# Patient Record
Sex: Male | Born: 1980 | Race: Black or African American | Hispanic: No | Marital: Single | Smoking: Former smoker
Health system: Southern US, Community
[De-identification: ages and names within clinical notes are randomized; demographics above are authoritative.]

## PROBLEM LIST (undated history)

## (undated) DIAGNOSIS — F32A Depression, unspecified: Secondary | ICD-10-CM

## (undated) DIAGNOSIS — F419 Anxiety disorder, unspecified: Secondary | ICD-10-CM

## (undated) DIAGNOSIS — F329 Major depressive disorder, single episode, unspecified: Secondary | ICD-10-CM

## (undated) DIAGNOSIS — D649 Anemia, unspecified: Secondary | ICD-10-CM

## (undated) DIAGNOSIS — F1110 Opioid abuse, uncomplicated: Secondary | ICD-10-CM

## (undated) DIAGNOSIS — J3489 Other specified disorders of nose and nasal sinuses: Secondary | ICD-10-CM

## (undated) HISTORY — DX: Anemia, unspecified: D64.9

## (undated) HISTORY — DX: Other specified disorders of nose and nasal sinuses: J34.89

---

## 2002-06-16 ENCOUNTER — Emergency Department (HOSPITAL_COMMUNITY): Admission: EM | Admit: 2002-06-16 | Discharge: 2002-06-16 | Payer: Self-pay | Admitting: Emergency Medicine

## 2004-10-27 ENCOUNTER — Emergency Department: Payer: Self-pay | Admitting: Emergency Medicine

## 2005-04-07 ENCOUNTER — Emergency Department: Payer: Self-pay | Admitting: Emergency Medicine

## 2007-12-06 ENCOUNTER — Emergency Department (HOSPITAL_COMMUNITY): Admission: EM | Admit: 2007-12-06 | Discharge: 2007-12-06 | Payer: Self-pay | Admitting: Emergency Medicine

## 2007-12-21 ENCOUNTER — Emergency Department (HOSPITAL_COMMUNITY): Admission: EM | Admit: 2007-12-21 | Discharge: 2007-12-22 | Payer: Self-pay | Admitting: Emergency Medicine

## 2007-12-22 ENCOUNTER — Emergency Department (HOSPITAL_BASED_OUTPATIENT_CLINIC_OR_DEPARTMENT_OTHER): Admission: EM | Admit: 2007-12-22 | Discharge: 2007-12-22 | Payer: Self-pay | Admitting: Emergency Medicine

## 2010-05-06 ENCOUNTER — Emergency Department (HOSPITAL_BASED_OUTPATIENT_CLINIC_OR_DEPARTMENT_OTHER): Admission: EM | Admit: 2010-05-06 | Discharge: 2010-05-06 | Payer: Self-pay | Admitting: Emergency Medicine

## 2010-07-17 ENCOUNTER — Emergency Department (HOSPITAL_BASED_OUTPATIENT_CLINIC_OR_DEPARTMENT_OTHER)
Admission: EM | Admit: 2010-07-17 | Discharge: 2010-07-17 | Payer: Self-pay | Source: Home / Self Care | Admitting: Emergency Medicine

## 2010-10-06 ENCOUNTER — Emergency Department (HOSPITAL_BASED_OUTPATIENT_CLINIC_OR_DEPARTMENT_OTHER)
Admission: EM | Admit: 2010-10-06 | Discharge: 2010-10-06 | Disposition: A | Discharge status: Home / Self Care | Payer: Self-pay | Attending: Emergency Medicine | Admitting: Emergency Medicine

## 2010-10-06 DIAGNOSIS — J45909 Unspecified asthma, uncomplicated: Secondary | ICD-10-CM | POA: Insufficient documentation

## 2010-10-06 DIAGNOSIS — M25569 Pain in unspecified knee: Secondary | ICD-10-CM | POA: Insufficient documentation

## 2010-10-06 DIAGNOSIS — F17200 Nicotine dependence, unspecified, uncomplicated: Secondary | ICD-10-CM | POA: Insufficient documentation

## 2010-10-20 ENCOUNTER — Emergency Department (HOSPITAL_BASED_OUTPATIENT_CLINIC_OR_DEPARTMENT_OTHER): Payer: Self-pay

## 2010-10-20 ENCOUNTER — Emergency Department (HOSPITAL_BASED_OUTPATIENT_CLINIC_OR_DEPARTMENT_OTHER)
Admission: EM | Admit: 2010-10-20 | Discharge: 2010-10-20 | Disposition: A | Discharge status: Home / Self Care | Payer: Self-pay | Attending: Emergency Medicine | Admitting: Emergency Medicine

## 2010-10-20 ENCOUNTER — Emergency Department (INDEPENDENT_AMBULATORY_CARE_PROVIDER_SITE_OTHER): Payer: Self-pay

## 2010-10-20 DIAGNOSIS — M79609 Pain in unspecified limb: Secondary | ICD-10-CM

## 2010-10-20 DIAGNOSIS — W208XXA Other cause of strike by thrown, projected or falling object, initial encounter: Secondary | ICD-10-CM | POA: Insufficient documentation

## 2010-10-20 DIAGNOSIS — F17200 Nicotine dependence, unspecified, uncomplicated: Secondary | ICD-10-CM | POA: Insufficient documentation

## 2010-10-20 DIAGNOSIS — S9030XA Contusion of unspecified foot, initial encounter: Secondary | ICD-10-CM | POA: Insufficient documentation

## 2010-11-01 ENCOUNTER — Emergency Department (HOSPITAL_BASED_OUTPATIENT_CLINIC_OR_DEPARTMENT_OTHER)
Admission: EM | Admit: 2010-11-01 | Discharge: 2010-11-01 | Disposition: A | Discharge status: Home / Self Care | Payer: Self-pay | Attending: Emergency Medicine | Admitting: Emergency Medicine

## 2010-11-01 DIAGNOSIS — M545 Low back pain, unspecified: Secondary | ICD-10-CM | POA: Insufficient documentation

## 2010-11-01 DIAGNOSIS — M5380 Other specified dorsopathies, site unspecified: Secondary | ICD-10-CM | POA: Insufficient documentation

## 2011-03-20 LAB — CULTURE, ROUTINE-ABSCESS

## 2011-04-12 ENCOUNTER — Encounter: Payer: Self-pay | Admitting: *Deleted

## 2011-04-12 ENCOUNTER — Emergency Department (HOSPITAL_BASED_OUTPATIENT_CLINIC_OR_DEPARTMENT_OTHER)
Admission: EM | Admit: 2011-04-12 | Discharge: 2011-04-12 | Disposition: A | Discharge status: Other Acute Inpatient Hospital | Payer: Self-pay | Attending: Emergency Medicine | Admitting: Emergency Medicine

## 2011-04-12 DIAGNOSIS — K089 Disorder of teeth and supporting structures, unspecified: Secondary | ICD-10-CM | POA: Insufficient documentation

## 2011-04-12 DIAGNOSIS — J45909 Unspecified asthma, uncomplicated: Secondary | ICD-10-CM | POA: Insufficient documentation

## 2011-04-12 DIAGNOSIS — F17200 Nicotine dependence, unspecified, uncomplicated: Secondary | ICD-10-CM | POA: Insufficient documentation

## 2011-04-12 DIAGNOSIS — K0889 Other specified disorders of teeth and supporting structures: Secondary | ICD-10-CM

## 2011-04-12 MED ORDER — HYDROCODONE-ACETAMINOPHEN 5-500 MG PO TABS: 1.0000 | ORAL_TABLET | Freq: Four times a day (QID) | ORAL | Status: AC | PRN

## 2011-04-12 NOTE — ED Provider Notes (Signed)
History/physical exam/procedure(s) were performed by non-physician practitioner and as supervising physician I was immediately available for consultation/collaboration. I have reviewed all notes and am in agreement with care and plan.   Hilario Quarry, MD 04/12/11 [phone]

## 2011-04-12 NOTE — ED Provider Notes (Signed)
History     CSN: [id number] Arrival date & time: 04/12/2011 12:18 PM   First MD Initiated Contact with Patient 04/12/11 1223      Chief Complaint  Patient presents with  . Dental Pain    (Consider location/radiation/quality/duration/timing/severity/associated sxs/prior treatment) HPI Comments: Pt states that he was seen by the dentist 5 days ago and referred to the oral surgeon for impacted wisdom teeth:pt was given antibiotics, but nothing for pain and pt that the pain is not tolerable for the next 5 days until he can see the surgeon  Patient is a 30 y.o. male presenting with tooth pain. The history is provided by the patient. No language interpreter was used.  Dental PainThe primary symptoms include mouth pain and headaches. The symptoms began more than 1 week ago. The symptoms are unchanged. The symptoms occur constantly.    Past Medical History  Diagnosis Date  . Asthma     History reviewed. No pertinent past surgical history.  History reviewed. No pertinent family history.  History  Substance Use Topics  . Smoking status: Current Everyday Smoker  . Smokeless tobacco: Not on file  . Alcohol Use: No      Review of Systems  Constitutional: Negative.   Respiratory: Negative.   Cardiovascular: Negative.   Neurological: Positive for headaches.    Allergies  Review of patient's allergies indicates no known allergies.  Home Medications   Current Outpatient Rx  Name Route Sig Dispense Refill  . CLINDAMYCIN HCL 150 MG PO CAPS Oral Take 150 mg by mouth 4 (four) times daily.        BP 152/80  Pulse 64  Temp(Src) 98.1 F (36.7 C) (Oral)  Resp 20  Ht 5\' 10"  (1.778 m)  Wt 255 lb (115.667 kg)  BMI 36.59 kg/m2  SpO2 98%  Physical Exam  Nursing note and vitals reviewed. Constitutional: He appears well-developed and well-nourished.  HENT:  Head: Normocephalic and atraumatic.  Right Ear: External ear normal.  Left Ear: External ear normal.       No gross  deformity or swelling noted to the left lower teeth:pt gum is tender to palpation  Cardiovascular: Normal rate and regular rhythm.   Pulmonary/Chest: Effort normal and breath sounds normal.    ED Course  Procedures (including critical care time)  Labs Reviewed - No data to display No results found.   1. Toothache       MDM Will treat for something for pain:pt is already on clindamycin        Teressa Lower, NP 04/12/11 1255

## 2011-04-12 NOTE — ED Notes (Signed)
Pt states he was seen at the dentist last week. Told he had an impacted wisdom tooth. Referred to an oral surgeon who cannot see him until next week.

## 2011-05-25 ENCOUNTER — Emergency Department (HOSPITAL_BASED_OUTPATIENT_CLINIC_OR_DEPARTMENT_OTHER)
Admission: EM | Admit: 2011-05-25 | Discharge: 2011-05-25 | Disposition: A | Discharge status: Home / Self Care | Payer: Self-pay | Attending: Emergency Medicine | Admitting: Emergency Medicine

## 2011-05-25 ENCOUNTER — Encounter (HOSPITAL_BASED_OUTPATIENT_CLINIC_OR_DEPARTMENT_OTHER): Payer: Self-pay | Admitting: Emergency Medicine

## 2011-05-25 DIAGNOSIS — F17200 Nicotine dependence, unspecified, uncomplicated: Secondary | ICD-10-CM | POA: Insufficient documentation

## 2011-05-25 DIAGNOSIS — J45909 Unspecified asthma, uncomplicated: Secondary | ICD-10-CM | POA: Insufficient documentation

## 2011-05-25 DIAGNOSIS — J111 Influenza due to unidentified influenza virus with other respiratory manifestations: Secondary | ICD-10-CM | POA: Insufficient documentation

## 2011-05-25 MED ORDER — KETOROLAC TROMETHAMINE 30 MG/ML IJ SOLN
30.0000 mg | Freq: Once | INTRAMUSCULAR | Status: AC
  Administered 2011-05-25: 30 mg via INTRAVENOUS
  Filled 2011-05-25: qty 1

## 2011-05-25 MED ORDER — ONDANSETRON HCL 4 MG/2ML IJ SOLN
4.0000 mg | Freq: Once | INTRAMUSCULAR | Status: AC
  Administered 2011-05-25: 4 mg via INTRAVENOUS
  Filled 2011-05-25: qty 2

## 2011-05-25 MED ORDER — SODIUM CHLORIDE 0.9 % IV BOLUS (SEPSIS)
1000.0000 mL | Freq: Once | INTRAVENOUS | Status: AC
  Administered 2011-05-25: 1000 mL via INTRAVENOUS

## 2011-05-25 MED ORDER — ACETAMINOPHEN 500 MG PO TABS
1000.0000 mg | ORAL_TABLET | Freq: Once | ORAL | Status: AC
  Administered 2011-05-25: 1000 mg via ORAL

## 2011-05-25 MED ORDER — OSELTAMIVIR PHOSPHATE 75 MG PO CAPS: 75.0000 mg | ORAL_CAPSULE | Freq: Two times a day (BID) | ORAL | Status: AC

## 2011-05-25 MED ORDER — MORPHINE SULFATE 4 MG/ML IJ SOLN
4.0000 mg | Freq: Once | INTRAMUSCULAR | Status: AC
  Administered 2011-05-25: 4 mg via INTRAVENOUS
  Filled 2011-05-25: qty 1

## 2011-05-25 NOTE — ED Notes (Signed)
Vomited 100 cc gastric fluid

## 2011-05-25 NOTE — ED Provider Notes (Signed)
History     CSN: [id number] Arrival date & time: 05/25/2011  8:48 AM   First MD Initiated Contact with Patient 05/25/11 [phone]      Chief Complaint  Patient presents with  . Fever    cough fever and vomitng x 48 hrs    (Consider location/radiation/quality/duration/timing/severity/associated sxs/prior treatment) HPI Comments: Did not receive flu vaccine  Patient is a 30 y.o. male presenting with fever. The history is provided by the patient. No language interpreter was used.  Fever Primary symptoms of the febrile illness include fever, fatigue, cough, nausea, vomiting and myalgias. Primary symptoms do not include headaches, shortness of breath, abdominal pain, dysuria or arthralgias. The current episode started yesterday. This is a new problem. The problem has been gradually worsening.  The fever began yesterday. The fever has been gradually worsening since its onset. The maximum temperature recorded prior to his arrival was 102 to 102.9 F. The temperature was taken by an oral thermometer.  The fatigue began yesterday. The fatigue has been worsening since its onset.  The cough began yesterday. The cough is new. The cough is non-productive.  Nausea began yesterday.  The vomiting began yesterday. Vomiting occurred once. The emesis contains stomach contents.  Myalgias began yesterday. The myalgias have been gradually worsening since their onset. The myalgias are generalized. The myalgias are aching. The discomfort from the myalgias is mild. The myalgias are not associated with weakness.    Past Medical History  Diagnosis Date  . Asthma     History reviewed. No pertinent past surgical history.  History reviewed. No pertinent family history.  History  Substance Use Topics  . Smoking status: Current Everyday Smoker  . Smokeless tobacco: Not on file  . Alcohol Use: No      Review of Systems  Constitutional: Positive for fever, chills, activity change, appetite change and fatigue.   HENT: Positive for congestion, sore throat and rhinorrhea. Negative for neck pain and neck stiffness.   Respiratory: Positive for cough. Negative for chest tightness and shortness of breath.   Cardiovascular: Negative for chest pain and palpitations.  Gastrointestinal: Positive for nausea and vomiting. Negative for abdominal pain.  Genitourinary: Negative for dysuria, urgency, frequency and flank pain.  Musculoskeletal: Positive for myalgias. Negative for back pain and arthralgias.  Neurological: Negative for dizziness, weakness, light-headedness, numbness and headaches.  All other systems reviewed and are negative.    Allergies  Review of patient's allergies indicates no known allergies.  Home Medications   Current Outpatient Rx  Name Route Sig Dispense Refill  . CLINDAMYCIN HCL 150 MG PO CAPS Oral Take 150 mg by mouth 4 (four) times daily.      . OSELTAMIVIR PHOSPHATE 75 MG PO CAPS Oral Take 1 capsule (75 mg total) by mouth every 12 (twelve) hours. 10 capsule 0    BP 125/77  Pulse 111  Temp 103.2 F (39.6 C)  Resp 24  SpO2 98%  Physical Exam  Nursing note and vitals reviewed. Constitutional: He is oriented to person, place, and time. He appears well-developed and well-nourished. No distress.  HENT:  Head: Normocephalic and atraumatic.  Right Ear: External ear normal.  Left Ear: External ear normal.  Mouth/Throat: Oropharynx is clear and moist. No oropharyngeal exudate.  Eyes: Conjunctivae and EOM are normal. Pupils are equal, round, and reactive to light.  Neck: Normal range of motion. Neck supple.  Cardiovascular: Regular rhythm, normal heart sounds and intact distal pulses.  Exam reveals no gallop and no friction rub.  No murmur heard.      Tachycardic rate  Pulmonary/Chest: Effort normal and breath sounds normal. No respiratory distress.  Abdominal: Soft. Bowel sounds are normal. There is no tenderness. There is no rebound and no guarding.  Musculoskeletal: Normal  range of motion. He exhibits no tenderness.  Lymphadenopathy:    He has no cervical adenopathy.  Neurological: He is alert and oriented to person, place, and time.  Skin: Skin is warm and dry. No rash noted.    ED Course  Procedures (including critical care time)  Labs Reviewed - No data to display No results found.   1. Influenza       MDM  Patient with influenza type illness. Your afebrile, myalgias, cough, congestion. He is a the treatment window for Tamiflu. He is provided this. I encouraged aggressive symptom control. Aggressive oral hydration at home. He is instructed to followup with her primary care physician next week. She's provide clear signs and symptoms for which returned emergency department.        Dayton Bailiff, MD 05/25/11 1150

## 2011-05-25 NOTE — ED Notes (Signed)
Pt present with fever and body aches with fever

## 2011-05-25 NOTE — ED Notes (Signed)
Pt reports "feeling Much better now"

## 2011-06-03 DIAGNOSIS — J45909 Unspecified asthma, uncomplicated: Secondary | ICD-10-CM | POA: Insufficient documentation

## 2011-06-03 DIAGNOSIS — E860 Dehydration: Secondary | ICD-10-CM | POA: Insufficient documentation

## 2011-06-03 DIAGNOSIS — R112 Nausea with vomiting, unspecified: Secondary | ICD-10-CM | POA: Insufficient documentation

## 2011-06-04 ENCOUNTER — Encounter (HOSPITAL_BASED_OUTPATIENT_CLINIC_OR_DEPARTMENT_OTHER): Payer: Self-pay | Admitting: *Deleted

## 2011-06-04 ENCOUNTER — Emergency Department (HOSPITAL_BASED_OUTPATIENT_CLINIC_OR_DEPARTMENT_OTHER)
Admission: EM | Admit: 2011-06-04 | Discharge: 2011-06-04 | Disposition: A | Discharge status: Home / Self Care | Payer: Self-pay | Attending: Emergency Medicine | Admitting: Emergency Medicine

## 2011-06-04 DIAGNOSIS — E860 Dehydration: Secondary | ICD-10-CM

## 2011-06-04 LAB — COMPREHENSIVE METABOLIC PANEL
AST: 13 U/L (ref 0–37)
Albumin: 3.8 g/dL (ref 3.5–5.2)
BUN: 10 mg/dL (ref 6–23)
Calcium: 9.3 mg/dL (ref 8.4–10.5)
Chloride: 102 mEq/L (ref 96–112)
Creatinine, Ser: 0.9 mg/dL (ref 0.50–1.35)
Total Bilirubin: 0.3 mg/dL (ref 0.3–1.2)
Total Protein: 7.1 g/dL (ref 6.0–8.3)

## 2011-06-04 LAB — CBC
MCH: 29.9 pg (ref 26.0–34.0)
MCV: 87.7 fL (ref 78.0–100.0)
Platelets: 88 10*3/uL — ABNORMAL LOW (ref 150–400)
RBC: 4.38 MIL/uL (ref 4.22–5.81)
RDW: 11.6 % (ref 11.5–15.5)

## 2011-06-04 LAB — LIPASE, BLOOD: Lipase: 12 U/L (ref 11–59)

## 2011-06-04 MED ORDER — MORPHINE SULFATE 4 MG/ML IJ SOLN
6.0000 mg | Freq: Once | INTRAMUSCULAR | Status: AC
  Administered 2011-06-04: 6 mg via INTRAVENOUS
  Filled 2011-06-04: qty 2

## 2011-06-04 MED ORDER — SODIUM CHLORIDE 0.9 % IV BOLUS (SEPSIS)
1000.0000 mL | Freq: Once | INTRAVENOUS | Status: AC
  Administered 2011-06-04: 1000 mL via INTRAVENOUS

## 2011-06-04 MED ORDER — PROMETHAZINE HCL 25 MG PO TABS: 25.0000 mg | ORAL_TABLET | Freq: Four times a day (QID) | ORAL | Status: AC | PRN

## 2011-06-04 MED ORDER — ONDANSETRON HCL 4 MG/2ML IJ SOLN
4.0000 mg | Freq: Once | INTRAMUSCULAR | Status: AC
  Administered 2011-06-04: 4 mg via INTRAVENOUS
  Filled 2011-06-04: qty 2

## 2011-06-04 NOTE — ED Notes (Signed)
C/o nausea and vomiting since last week. Pt was seen here and dx'd with flu last week but doesn't feel any better.

## 2011-06-04 NOTE — ED Provider Notes (Signed)
History     CSN: [id number] Arrival date & time: 06/04/2011 12:36 AM   First MD Initiated Contact with Patient 06/04/11 0002      Chief Complaint  Patient presents with  . Emesis  . Nausea    (Consider location/radiation/quality/duration/timing/severity/associated sxs/prior treatment) Patient is a 30 y.o. male presenting with vomiting. The history is provided by the patient.  Emesis    the patient reports he had flulike symptoms last week and was started on Tamiflu.  His symptoms at that time were cough congestion myalgias sore throat fever and chills.  He reports for the last 2 days he's developed severe nausea with some vomiting.  He reports several loose stools.  He reports no fever or chills at this time.  He reports decreased by mouth intake over the past 24 hours.  He has no recent sick contacts.  He reports abdominal pain only when he coughs.  He has no shortness of breath at this time but still does have a mild cough.  He denies dysuria or urinary frequency.  Past Medical History  Diagnosis Date  . Asthma     History reviewed. No pertinent past surgical history.  History reviewed. No pertinent family history.  History  Substance Use Topics  . Smoking status: Current Everyday Smoker  . Smokeless tobacco: Not on file  . Alcohol Use: No      Review of Systems  Gastrointestinal: Positive for vomiting.  All other systems reviewed and are negative.    Allergies  Review of patient's allergies indicates no known allergies.  Home Medications   Current Outpatient Rx  Name Route Sig Dispense Refill  . CLINDAMYCIN HCL 150 MG PO CAPS Oral Take 150 mg by mouth 4 (four) times daily.      . OSELTAMIVIR PHOSPHATE 75 MG PO CAPS Oral Take 1 capsule (75 mg total) by mouth every 12 (twelve) hours. 10 capsule 0    BP 133/87  Pulse 81  Temp(Src) 99.1 F (37.3 C) (Oral)  Resp 17  SpO2 98%  Physical Exam  Nursing note and vitals reviewed. Constitutional: He is oriented  to person, place, and time. He appears well-developed and well-nourished.  HENT:  Head: Normocephalic and atraumatic.       Mucous membranes dry  Eyes: EOM are normal.  Neck: Normal range of motion.  Cardiovascular: Normal rate, regular rhythm, normal heart sounds and intact distal pulses.   Pulmonary/Chest: Effort normal and breath sounds normal. No respiratory distress.  Abdominal: Soft. He exhibits no distension. There is no tenderness.  Musculoskeletal: Normal range of motion.  Neurological: He is alert and oriented to person, place, and time.  Skin: Skin is warm and dry.  Psychiatric: He has a normal mood and affect. Judgment normal.    ED Course  Procedures (including critical care time)  Labs Reviewed  CBC - Abnormal; Notable for the following:    HCT 38.4 (*)    Platelets 88 (*) SPECIMEN CHECKED FOR CLOTS   All other components within normal limits  COMPREHENSIVE METABOLIC PANEL - Abnormal; Notable for the following:    Glucose, Bld 114 (*)    All other components within normal limits  LIPASE, BLOOD   No results found.   1. Nausea and vomiting   2. Dehydration       MDM  We'll hydrate and check basic labs.  3:10 AM The patient feels much better at this time after 2 L of normal saline.  Home with antiemetics and close followup  Lyanne Co, MD 06/04/11 [phone]

## 2011-06-04 NOTE — ED Notes (Signed)
Pt did not have existing IV upon this assessment.

## 2011-09-22 ENCOUNTER — Encounter (HOSPITAL_BASED_OUTPATIENT_CLINIC_OR_DEPARTMENT_OTHER): Payer: Self-pay | Admitting: Family Medicine

## 2011-09-22 ENCOUNTER — Emergency Department (HOSPITAL_BASED_OUTPATIENT_CLINIC_OR_DEPARTMENT_OTHER)
Admission: EM | Admit: 2011-09-22 | Discharge: 2011-09-22 | Disposition: A | Discharge status: Home / Self Care | Payer: Self-pay | Attending: Emergency Medicine | Admitting: Emergency Medicine

## 2011-09-22 DIAGNOSIS — J45909 Unspecified asthma, uncomplicated: Secondary | ICD-10-CM | POA: Insufficient documentation

## 2011-09-22 DIAGNOSIS — R1110 Vomiting, unspecified: Secondary | ICD-10-CM

## 2011-09-22 DIAGNOSIS — R197 Diarrhea, unspecified: Secondary | ICD-10-CM | POA: Insufficient documentation

## 2011-09-22 LAB — CBC
HCT: 42.4 % (ref 39.0–52.0)
Hemoglobin: 14.2 g/dL (ref 13.0–17.0)
MCH: 29.3 pg (ref 26.0–34.0)
MCV: 87.4 fL (ref 78.0–100.0)
Platelets: 104 10*3/uL — ABNORMAL LOW (ref 150–400)
RBC: 4.85 MIL/uL (ref 4.22–5.81)
WBC: 6.5 10*3/uL (ref 4.0–10.5)

## 2011-09-22 LAB — BASIC METABOLIC PANEL
CO2: 22 mEq/L (ref 19–32)
Calcium: 9.7 mg/dL (ref 8.4–10.5)
Chloride: 105 mEq/L (ref 96–112)
Creatinine, Ser: 1.4 mg/dL — ABNORMAL HIGH (ref 0.50–1.35)
Glucose, Bld: 98 mg/dL (ref 70–99)

## 2011-09-22 MED ORDER — ONDANSETRON HCL 4 MG PO TABS: 4.0000 mg | ORAL_TABLET | Freq: Four times a day (QID) | ORAL | Status: AC

## 2011-09-22 MED ORDER — ONDANSETRON 4 MG PO TBDP
4.0000 mg | ORAL_TABLET | Freq: Once | ORAL | Status: AC
  Administered 2011-09-22: 4 mg via ORAL
  Filled 2011-09-22: qty 1

## 2011-09-22 MED ORDER — DICYCLOMINE HCL 20 MG PO TABS: 20.0000 mg | ORAL_TABLET | Freq: Two times a day (BID) | ORAL | Status: DC

## 2011-09-22 MED ORDER — DICYCLOMINE HCL 10 MG PO CAPS
10.0000 mg | ORAL_CAPSULE | Freq: Once | ORAL | Status: AC
  Administered 2011-09-22: 10 mg via ORAL
  Filled 2011-09-22: qty 1

## 2011-09-22 NOTE — ED Provider Notes (Signed)
Medical screening examination/treatment/procedure(s) were performed by non-physician practitioner and as supervising physician I was immediately available for consultation/collaboration.    Aariel Ems L Mitsy Owen, MD 09/22/11 1835 

## 2011-09-22 NOTE — ED Notes (Signed)
Patient tolerates PO water well; Sprite given.

## 2011-09-22 NOTE — ED Provider Notes (Signed)
History     CSN: [id number]  Arrival date & time 09/22/11  1215   First MD Initiated Contact with Patient 09/22/11 1256      Chief Complaint  Patient presents with  . Emesis  . Diarrhea    (Consider location/radiation/quality/duration/timing/severity/associated sxs/prior treatment) HPI  Pt presents to the ED for vomiting and a couple episodes of diarrhea without abdominal pain. The symptoms started Friday. He believes that he got the illness from whose daughter who just recently got over the same symptoms. He denies vomiting after every time he eats or drinks and feels as though he is hydrated. He has had a headache since vomiting so much but he is not having those symptoms anymore. He denies abd pain, SOB, weakness, CP, headaches, focal weakness, blurry vision or any other symptoms at this time,.  Past Medical History  Diagnosis Date  . Asthma     History reviewed. No pertinent past surgical history.  No family history on file.  History  Substance Use Topics  . Smoking status: Current Everyday Smoker  . Smokeless tobacco: Not on file  . Alcohol Use: No      Review of Systems  All other systems reviewed and are negative.    Allergies  Review of patient's allergies indicates no known allergies.  Home Medications   Current Outpatient Rx  Name Route Sig Dispense Refill  . BISMUTH SUBSALICYLATE 262 MG/15ML PO SUSP Oral Take 15 mLs by mouth every 6 (six) hours as needed. Patient used this medication for upset stomach.    Marland Kitchen CLINDAMYCIN HCL 150 MG PO CAPS Oral Take 150 mg by mouth 4 (four) times daily.      Marland Kitchen LOPERAMIDE HCL 2 MG PO CAPS Oral Take 2 mg by mouth 4 (four) times daily as needed. Patient used this medication for diarrhea.    Marland Kitchen DICYCLOMINE HCL 20 MG PO TABS Oral Take 1 tablet (20 mg total) by mouth 2 (two) times daily. 20 tablet 0  . ONDANSETRON HCL 4 MG PO TABS Oral Take 1 tablet (4 mg total) by mouth every 6 (six) hours. 12 tablet 0    BP 126/84  Pulse  104  Temp(Src) 98.4 F (36.9 C) (Oral)  Resp 16  Ht 5\' 10"  (1.778 m)  Wt 237 lb (107.502 kg)  BMI 34.01 kg/m2  SpO2 97%  Physical Exam  Nursing note and vitals reviewed. Constitutional: He appears well-developed and well-nourished. No distress.  HENT:  Head: Normocephalic and atraumatic.  Eyes: Pupils are equal, round, and reactive to light.  Neck: Normal range of motion. Neck supple.  Cardiovascular: Normal rate and regular rhythm.   Pulmonary/Chest: Effort normal.  Abdominal: Soft. Bowel sounds are normal. He exhibits no distension. There is no tenderness. There is no rebound and no guarding.  Neurological: He is alert.  Skin: Skin is warm and dry.    ED Course  Procedures (including critical care time)  Labs Reviewed  CBC - Abnormal; Notable for the following:    Platelets 104 (*)    All other components within normal limits  BASIC METABOLIC PANEL - Abnormal; Notable for the following:    Creatinine, Ser 1.40 (*)    GFR calc non Af Amer 66 (*)    GFR calc Af Amer 77 (*)    All other components within normal limits   No results found.   1. Vomiting and diarrhea       MDM  Pt  Given IV bentyl and Zofran than oral  challenged. Pts labs shows him to be dehydrated. Pt drank two cups of liquid in ED without vomiting and admits to feeling much better. Pt given Rx for Bentyl and Zofran.   Pt has been advised of the symptoms that warrant their return to the ED. Patient has voiced understanding and has agreed to follow-up with the PCP or specialist.         Dorthula Matas, PA 09/22/11 1524

## 2011-09-22 NOTE — Discharge Instructions (Signed)
Nausea and Vomiting  Nausea is a sick feeling that often comes before throwing up (vomiting). Vomiting is a reflex where stomach contents come out of your mouth. Vomiting can cause severe loss of body fluids (dehydration). Children and elderly adults can become dehydrated quickly, especially if they also have diarrhea. Nausea and vomiting are symptoms of a condition or disease. It is important to find the cause of your symptoms.  CAUSES    Direct irritation of the stomach lining. This irritation can result from increased acid production (gastroesophageal reflux disease), infection, food poisoning, taking certain medicines (such as nonsteroidal anti-inflammatory drugs), alcohol use, or tobacco use.   Signals from the brain.These signals could be caused by a headache, heat exposure, an inner ear disturbance, increased pressure in the brain from injury, infection, a tumor, or a concussion, pain, emotional stimulus, or metabolic problems.   An obstruction in the gastrointestinal tract (bowel obstruction).   Illnesses such as diabetes, hepatitis, gallbladder problems, appendicitis, kidney problems, cancer, sepsis, atypical symptoms of a heart attack, or eating disorders.   Medical treatments such as chemotherapy and radiation.   Receiving medicine that makes you sleep (general anesthetic) during surgery.  DIAGNOSIS  Your caregiver may ask for tests to be done if the problems do not improve after a few days. Tests may also be done if symptoms are severe or if the reason for the nausea and vomiting is not clear. Tests may include:   Urine tests.   Blood tests.   Stool tests.   Cultures (to look for evidence of infection).   X-rays or other imaging studies.  Test results can help your caregiver make decisions about treatment or the need for additional tests.  TREATMENT  You need to stay well hydrated. Drink frequently but in small amounts.You may wish to drink water, sports drinks, clear broth, or eat frozen  ice pops or gelatin dessert to help stay hydrated.When you eat, eating slowly may help prevent nausea.There are also some antinausea medicines that may help prevent nausea.  HOME CARE INSTRUCTIONS    Take all medicine as directed by your caregiver.   If you do not have an appetite, do not force yourself to eat. However, you must continue to drink fluids.   If you have an appetite, eat a normal diet unless your caregiver tells you differently.   Eat a variety of complex carbohydrates (rice, wheat, potatoes, bread), lean meats, yogurt, fruits, and vegetables.   Avoid high-fat foods because they are more difficult to digest.   Drink enough water and fluids to keep your urine clear or pale yellow.   If you are dehydrated, ask your caregiver for specific rehydration instructions. Signs of dehydration may include:   Severe thirst.   Dry lips and mouth.   Dizziness.   Dark urine.   Decreasing urine frequency and amount.   Confusion.   Rapid breathing or pulse.  SEEK IMMEDIATE MEDICAL CARE IF:    You have blood or brown flecks (like coffee grounds) in your vomit.   You have black or bloody stools.   You have a severe headache or stiff neck.   You are confused.   You have severe abdominal pain.   You have chest pain or trouble breathing.   You do not urinate at least once every 8 hours.   You develop cold or clammy skin.   You continue to vomit for longer than 24 to 48 hours.   You have a fever.  MAKE SURE YOU:      Understand these instructions.   Will watch your condition.   Will get help right away if you are not doing well or get worse.  Document Released: 06/09/2005 Document Revised: 05/29/2011 Document Reviewed: 11/06/2010  [employer] Patient Information 2012 [employer]

## 2011-09-22 NOTE — ED Notes (Signed)
Pt c/o n/v/d since Friday. Pt also sts he has headache and cramping in abdomen.

## 2012-03-19 ENCOUNTER — Emergency Department (HOSPITAL_COMMUNITY)
Admission: EM | Admit: 2012-03-19 | Discharge: 2012-03-19 | Disposition: A | Discharge status: Home / Self Care | Payer: Self-pay | Attending: Emergency Medicine | Admitting: Emergency Medicine

## 2012-03-19 ENCOUNTER — Encounter (HOSPITAL_COMMUNITY): Payer: Self-pay | Admitting: Physical Medicine and Rehabilitation

## 2012-03-19 DIAGNOSIS — J45909 Unspecified asthma, uncomplicated: Secondary | ICD-10-CM | POA: Insufficient documentation

## 2012-03-19 DIAGNOSIS — IMO0002 Reserved for concepts with insufficient information to code with codable children: Secondary | ICD-10-CM | POA: Insufficient documentation

## 2012-03-19 DIAGNOSIS — F17200 Nicotine dependence, unspecified, uncomplicated: Secondary | ICD-10-CM | POA: Insufficient documentation

## 2012-03-19 DIAGNOSIS — L0291 Cutaneous abscess, unspecified: Secondary | ICD-10-CM

## 2012-03-19 HISTORY — DX: Opioid abuse, uncomplicated: F11.10

## 2012-03-19 NOTE — ED Notes (Signed)
Pt presents to department for evaluation of R forearm abscess. States "I think this could be a spider bite." noticed x2 days ago, states increased swelling and pain. Area noted to be red, raised and tender to palpation. 8/10 pain at the time. No drainage noted. He is alert and oriented x4. No signs of distress noted.

## 2012-03-19 NOTE — ED Notes (Addendum)
Pt admitted today for spider bite that occurred Wed night around 9pm. Pt states that site has been increasingly getting larger. Site is red, swollen, and tender to touch. Pt is on methadone and states that he cannot take any narcotics.

## 2012-03-19 NOTE — ED Provider Notes (Signed)
History   This chart was scribed for Hurman Horn, MD by Melba Coon. The patient was seen in room TR05C/TR05C and the patient's care was started at 12:32PM.    CSN: [id number]  Arrival date & time 03/19/12  1054   None     Chief Complaint  Patient presents with  . Abscess    (Consider location/radiation/quality/duration/timing/severity/associated sxs/prior treatment) HPI Joseph [name] is a 31 y.o. male who presents to the Emergency Department complaining of persistent, moderate abscess to the right forearm with an onset 2 days ago. [name] states that it could be from am spider bite. Arm is non tender at the time of exam. No HA, fever, neck pain, sore throat, rash, back pain, CP, SOB, abd pain, n/v/d, dysuria, or extremity weakness, numbness, or tingling. No known allergies. No Hx of HIV. No other pertinent medical symptoms.  Past Medical History  Diagnosis Date  . Asthma   . Narcotic abuse     No past surgical history on file.  History reviewed. No pertinent family history.  History  Substance Use Topics  . Smoking status: Current Every Day Smoker  . Smokeless tobacco: Not on file  . Alcohol Use: No      Review of Systems 10 Systems reviewed and all are negative for acute change except as noted in the HPI.   Allergies  Review of patient's allergies indicates no known allergies.  Home Medications   Current Outpatient Rx  Name Route Sig Dispense Refill  . METHADONE HCL 10 MG/ML PO CONC Oral Take 50 mg by mouth daily.      BP 143/88  Pulse 97  Temp 97.7 F (36.5 C) (Oral)  Resp 16  SpO2 95%  Physical Exam  Nursing note and vitals reviewed. Constitutional: He is oriented to person, place, and time. He appears well-developed and well-nourished. No distress.  HENT:  Head: Normocephalic and atraumatic.  Eyes: EOM are normal.  Neck: Neck supple. No tracheal deviation present.  Cardiovascular: Normal rate.   Pulmonary/Chest: Effort normal. No  respiratory distress.  Musculoskeletal: Normal range of motion. He exhibits no tenderness (right arm is non tender).  Neurological: He is alert and oriented to person, place, and time.       Light touch and sensation intact to the right arm. NV intact.  Skin: Skin is warm and dry.       3cm abscess with fluctuance to the right distal forearm without surrounding cellulitis with CR < 2 sec.  Psychiatric: He has a normal mood and affect. His behavior is normal.    ED Course  Procedures (including critical care time) INCISION AND DRAINAGE Performed by: Johnnette Gourd Consent: Verbal consent obtained. Risks and benefits: risks, benefits and alternatives were discussed Type: abscess  Body area: right forarm  Anesthesia: local infiltration  Local anesthetic: lidocaine 2% without epinephrine  Anesthetic total: 5 ml  Complexity: complex Blunt dissection to break up loculations  Drainage: purulent  Drainage amount: large  Packing material: 1/4 in iodoform gauze  Patient tolerance: Patient tolerated the procedure well with no immediate complications.    COORDINATION OF CARE:  12:35PM - [name] will be moved back to CDU so the PA can perform an I&D.   Labs Reviewed - No data to display No results found.   1. Abscess       MDM  31 year old male with abscess right forearm. Incision and drainage performed without any problem. Advised ibuprofen as needed for pain. Advised warm compresses  and ice as needed. He is aware to return 48 hours for packing removal and recheck. Close return precautions discussed.     Trevor Mace, PA-C 03/19/12 1320

## 2012-03-20 NOTE — ED Provider Notes (Signed)
This chart was scribed for Hurman Horn, MD by Melba Coon. The patient was seen in room TR05C/TR05C and the patient's care was started at 12:32PM.  CSN: [id number]  Arrival date & time 03/19/12 1054  None  Chief Complaint   Patient presents with   .  Abscess   (Consider location/radiation/quality/duration/timing/severity/associated sxs/prior  treatment)  HPI  Joseph [name] is a 31 y.o. male who presents to the Emergency Department complaining of persistent, moderate abscess to the right forearm with an onset 2 days ago. Joseph [name] states that it could be from am spider bite. Arm is non tender at the time of exam. No HA, fever, neck pain, sore throat, rash, back pain, CP, SOB, abd pain, n/v/d, dysuria, or extremity weakness, numbness, or tingling. No known allergies. No Hx of HIV. No other pertinent medical symptoms.  Past Medical History   Diagnosis  Date   .  Asthma    .  Narcotic abuse    No past surgical history on file.  History reviewed. No pertinent family history.  History   Substance Use Topics   .  Smoking status:  Current Every Day Smoker   .  Smokeless tobacco:  Not on file   .  Alcohol Use:  No   Review of Systems  10 Systems reviewed and all are negative for acute change except as noted in the HPI.  Allergies   Review of patient's allergies indicates no known allergies.  Home Medications    Current Outpatient Rx   Name  Route  Sig  Dispense  Refill   .  METHADONE HCL 10 MG/ML PO CONC  Oral  Take 50 mg by mouth daily.     BP 143/88  Pulse 97  Temp 97.7 F (36.5 C) (Oral)  Resp 16  SpO2 95%  Physical Exam  Nursing note and vitals reviewed.  Constitutional: He is oriented to person, place, and time. He appears well-developed and well-nourished. No distress.  HENT:  Head: Normocephalic and atraumatic.  Eyes: EOM are normal.  Neck: Neck supple. No tracheal deviation present.  Cardiovascular: Normal rate.  Pulmonary/Chest: Effort normal. No respiratory  distress.  Musculoskeletal: Normal range of motion. He exhibits no tenderness (right arm is non tender).  Neurological: He is alert and oriented to person, place, and time.  Light touch and sensation intact to the right arm. NV intact.  Skin: Skin is warm and dry.  3cm abscess with fluctuance to the right distal forearm without surrounding cellulitis with CR < 2 sec.  Psychiatric: He has a normal mood and affect. His behavior is normal.  ED Course   Procedures (including critical care time)  12:35PM - Joseph [name] will be moved back to CDU so the PA can perform an I&D.  Labs Reviewed - No data to display  No results found.  1.  Abscess    Medical screening examination/treatment/procedure(s) were conducted as a shared visit with non-physician practitioner(s) and myself.  I personally evaluated the patient during the encounter. I personally performed the services described in this documentation, which was scribed in my presence. The recorded information has been reviewed and considered.    Hurman Horn, MD 03/20/12 2127

## 2012-03-24 ENCOUNTER — Encounter (HOSPITAL_COMMUNITY): Payer: Self-pay | Admitting: Anesthesiology

## 2012-03-24 ENCOUNTER — Other Ambulatory Visit: Payer: Self-pay | Admitting: Orthopaedic Surgery

## 2012-03-24 ENCOUNTER — Emergency Department (INDEPENDENT_AMBULATORY_CARE_PROVIDER_SITE_OTHER)
Admission: EM | Admit: 2012-03-24 | Discharge: 2012-03-24 | Disposition: A | Discharge status: Home / Self Care | Payer: Self-pay | Source: Home / Self Care

## 2012-03-24 ENCOUNTER — Encounter (HOSPITAL_COMMUNITY): Payer: Self-pay

## 2012-03-24 ENCOUNTER — Encounter (HOSPITAL_COMMUNITY): Payer: Self-pay | Admitting: *Deleted

## 2012-03-24 ENCOUNTER — Emergency Department (HOSPITAL_COMMUNITY): Payer: Self-pay | Admitting: Anesthesiology

## 2012-03-24 ENCOUNTER — Encounter (HOSPITAL_COMMUNITY)
Admission: EM | Disposition: A | Discharge status: Home / Self Care | Payer: Self-pay | Source: Home / Self Care | Attending: Orthopaedic Surgery

## 2012-03-24 ENCOUNTER — Inpatient Hospital Stay (HOSPITAL_COMMUNITY)
Admission: EM | Admit: 2012-03-24 | Discharge: 2012-03-26 | DRG: 603 | Disposition: A | Discharge status: Home / Self Care | Payer: MEDICAID | Attending: Orthopaedic Surgery | Admitting: Orthopaedic Surgery

## 2012-03-24 DIAGNOSIS — L02419 Cutaneous abscess of limb, unspecified: Secondary | ICD-10-CM

## 2012-03-24 DIAGNOSIS — IMO0002 Reserved for concepts with insufficient information to code with codable children: Secondary | ICD-10-CM

## 2012-03-24 DIAGNOSIS — J45909 Unspecified asthma, uncomplicated: Secondary | ICD-10-CM | POA: Diagnosis present

## 2012-03-24 DIAGNOSIS — Z23 Encounter for immunization: Secondary | ICD-10-CM

## 2012-03-24 DIAGNOSIS — Z79899 Other long term (current) drug therapy: Secondary | ICD-10-CM

## 2012-03-24 DIAGNOSIS — L02413 Cutaneous abscess of right upper limb: Secondary | ICD-10-CM

## 2012-03-24 DIAGNOSIS — L03113 Cellulitis of right upper limb: Secondary | ICD-10-CM

## 2012-03-24 DIAGNOSIS — F17200 Nicotine dependence, unspecified, uncomplicated: Secondary | ICD-10-CM | POA: Diagnosis present

## 2012-03-24 HISTORY — PX: I&D EXTREMITY: SHX5045

## 2012-03-24 LAB — BASIC METABOLIC PANEL
CO2: 28 mEq/L (ref 19–32)
Calcium: 9.3 mg/dL (ref 8.4–10.5)
Creatinine, Ser: 0.86 mg/dL (ref 0.50–1.35)
GFR calc non Af Amer: >90 mL/min (ref >90)
Glucose, Bld: 110 mg/dL — ABNORMAL HIGH (ref 70–99)

## 2012-03-24 LAB — CBC WITH DIFFERENTIAL/PLATELET
Basophils Absolute: 0 10*3/uL (ref 0.0–0.1)
Eosinophils Relative: 1 % (ref 0–5)
HCT: 36.5 % — ABNORMAL LOW (ref 39.0–52.0)
Lymphs Abs: 2.5 10*3/uL (ref 0.7–4.0)
MCH: 28.8 pg (ref 26.0–34.0)
MCV: 86.9 fL (ref 78.0–100.0)
Monocytes Absolute: 2.1 10*3/uL — ABNORMAL HIGH (ref 0.1–1.0)
Monocytes Relative: 12 % (ref 3–12)
Neutro Abs: 13 10*3/uL — ABNORMAL HIGH (ref 1.7–7.7)
RDW: 12.2 % (ref 11.5–15.5)
WBC: 17.8 10*3/uL — ABNORMAL HIGH (ref 4.0–10.5)

## 2012-03-24 SURGERY — IRRIGATION AND DEBRIDEMENT EXTREMITY
Anesthesia: General | Site: Arm Lower | Laterality: Right | Wound class: Dirty or Infected

## 2012-03-24 MED ORDER — METHADONE HCL 10 MG PO TABS
50.0000 mg | ORAL_TABLET | Freq: Every day | ORAL | Status: DC
  Administered 2012-03-25 – 2012-03-26 (×2): 50 mg via ORAL
  Filled 2012-03-24 (×2): qty 5

## 2012-03-24 MED ORDER — LACTATED RINGERS IV SOLN
INTRAVENOUS | Status: DC | PRN
  Administered 2012-03-24: 18:00:00 via INTRAVENOUS

## 2012-03-24 MED ORDER — LIDOCAINE HCL (CARDIAC) 20 MG/ML IV SOLN
INTRAVENOUS | Status: DC | PRN
  Administered 2012-03-24: 50 mg via INTRAVENOUS

## 2012-03-24 MED ORDER — HYDROMORPHONE HCL PF 1 MG/ML IJ SOLN
INTRAMUSCULAR | Status: AC
  Filled 2012-03-24: qty 1

## 2012-03-24 MED ORDER — KCL IN DEXTROSE-NACL 20-5-0.45 MEQ/L-%-% IV SOLN
INTRAVENOUS | Status: AC
  Filled 2012-03-24: qty 1000

## 2012-03-24 MED ORDER — SODIUM CHLORIDE 0.9 % IR SOLN
Status: DC | PRN
  Administered 2012-03-24: 3000 mL

## 2012-03-24 MED ORDER — PNEUMOCOCCAL VAC POLYVALENT 25 MCG/0.5ML IJ INJ
0.5000 mL | INJECTION | INTRAMUSCULAR | Status: AC
  Administered 2012-03-25: 0.5 mL via INTRAMUSCULAR
  Filled 2012-03-24: qty 0.5

## 2012-03-24 MED ORDER — CEFAZOLIN SODIUM-DEXTROSE 2-3 GM-% IV SOLR
2.0000 g | Freq: Once | INTRAVENOUS | Status: AC
  Administered 2012-03-24: 2 g via INTRAVENOUS
  Filled 2012-03-24: qty 50

## 2012-03-24 MED ORDER — ONDANSETRON HCL 4 MG/2ML IJ SOLN: 4.0000 mg | Freq: Four times a day (QID) | INTRAMUSCULAR | Status: DC | PRN

## 2012-03-24 MED ORDER — ONDANSETRON HCL 4 MG PO TABS: 4.0000 mg | ORAL_TABLET | Freq: Four times a day (QID) | ORAL | Status: DC | PRN

## 2012-03-24 MED ORDER — METOCLOPRAMIDE HCL 5 MG PO TABS
5.0000 mg | ORAL_TABLET | Freq: Three times a day (TID) | ORAL | Status: DC | PRN
  Filled 2012-03-24: qty 2

## 2012-03-24 MED ORDER — ONDANSETRON HCL 4 MG/2ML IJ SOLN
INTRAMUSCULAR | Status: DC | PRN
  Administered 2012-03-24: 4 mg via INTRAVENOUS

## 2012-03-24 MED ORDER — FENTANYL CITRATE 0.05 MG/ML IJ SOLN
INTRAMUSCULAR | Status: DC | PRN
  Administered 2012-03-24: 50 ug via INTRAVENOUS
  Administered 2012-03-24 (×2): 100 ug via INTRAVENOUS

## 2012-03-24 MED ORDER — OXYCODONE-ACETAMINOPHEN 5-325 MG PO TABS
ORAL_TABLET | ORAL | Status: AC
  Filled 2012-03-24: qty 2

## 2012-03-24 MED ORDER — CEFAZOLIN SODIUM-DEXTROSE 2-3 GM-% IV SOLR
2.0000 g | Freq: Three times a day (TID) | INTRAVENOUS | Status: DC
  Administered 2012-03-24 – 2012-03-26 (×6): 2 g via INTRAVENOUS
  Filled 2012-03-24 (×8): qty 50

## 2012-03-24 MED ORDER — VANCOMYCIN HCL 1000 MG IV SOLR
750.0000 mg | Freq: Once | INTRAVENOUS | Status: AC
  Administered 2012-03-25: 750 mg via INTRAVENOUS
  Filled 2012-03-24: qty 750

## 2012-03-24 MED ORDER — METOCLOPRAMIDE HCL 5 MG/ML IJ SOLN
5.0000 mg | Freq: Three times a day (TID) | INTRAMUSCULAR | Status: DC | PRN
  Filled 2012-03-24: qty 2

## 2012-03-24 MED ORDER — OXYCODONE-ACETAMINOPHEN 5-325 MG PO TABS
1.0000 | ORAL_TABLET | ORAL | Status: DC | PRN
Start: 2012-03-24 — End: 2012-03-26
  Administered 2012-03-24 – 2012-03-26 (×8): 2 via ORAL
  Filled 2012-03-24 (×7): qty 2

## 2012-03-24 MED ORDER — METHADONE HCL 10 MG/ML PO CONC: 50.0000 mg | Freq: Every day | ORAL | Status: DC

## 2012-03-24 MED ORDER — IBUPROFEN 800 MG PO TABS
800.0000 mg | ORAL_TABLET | Freq: Three times a day (TID) | ORAL | Status: DC
  Administered 2012-03-24 – 2012-03-25 (×2): 800 mg via ORAL
  Filled 2012-03-24 (×4): qty 1

## 2012-03-24 MED ORDER — DOCUSATE SODIUM 100 MG PO CAPS
100.0000 mg | ORAL_CAPSULE | Freq: Two times a day (BID) | ORAL | Status: DC
  Administered 2012-03-24 – 2012-03-26 (×4): 100 mg via ORAL
  Filled 2012-03-24 (×4): qty 1

## 2012-03-24 MED ORDER — SUCCINYLCHOLINE CHLORIDE 20 MG/ML IJ SOLN
INTRAMUSCULAR | Status: DC | PRN
  Administered 2012-03-24: 120 mg via INTRAVENOUS

## 2012-03-24 MED ORDER — VANCOMYCIN HCL IN DEXTROSE 1-5 GM/200ML-% IV SOLN
1000.0000 mg | Freq: Two times a day (BID) | INTRAVENOUS | Status: DC
  Administered 2012-03-25 – 2012-03-26 (×3): 1000 mg via INTRAVENOUS
  Filled 2012-03-24 (×4): qty 200

## 2012-03-24 MED ORDER — HYDROMORPHONE HCL PF 1 MG/ML IJ SOLN
0.2500 mg | INTRAMUSCULAR | Status: DC | PRN
  Administered 2012-03-24 (×4): 0.5 mg via INTRAVENOUS

## 2012-03-24 MED ORDER — MORPHINE SULFATE 2 MG/ML IJ SOLN
1.0000 mg | INTRAMUSCULAR | Status: DC | PRN
  Administered 2012-03-25 – 2012-03-26 (×3): 1 mg via INTRAVENOUS
  Filled 2012-03-24 (×3): qty 1

## 2012-03-24 MED ORDER — KCL IN DEXTROSE-NACL 20-5-0.45 MEQ/L-%-% IV SOLN
INTRAVENOUS | Status: DC
  Administered 2012-03-24: 20:00:00 via INTRAVENOUS
  Administered 2012-03-25: 999 mL via INTRAVENOUS
  Administered 2012-03-26: 06:00:00 via INTRAVENOUS
  Filled 2012-03-24 (×5): qty 1000

## 2012-03-24 MED ORDER — MIDAZOLAM HCL 5 MG/5ML IJ SOLN
INTRAMUSCULAR | Status: DC | PRN
  Administered 2012-03-24: 2 mg via INTRAVENOUS

## 2012-03-24 MED ORDER — INFLUENZA VIRUS VACC SPLIT PF IM SUSP
0.5000 mL | INTRAMUSCULAR | Status: AC
  Administered 2012-03-25: 0.5 mL via INTRAMUSCULAR
  Filled 2012-03-24: qty 0.5

## 2012-03-24 MED ORDER — PROPOFOL 10 MG/ML IV BOLUS
INTRAVENOUS | Status: DC | PRN
  Administered 2012-03-24: 120 mg via INTRAVENOUS

## 2012-03-24 MED ORDER — VANCOMYCIN HCL IN DEXTROSE 1-5 GM/200ML-% IV SOLN
1000.0000 mg | Freq: Once | INTRAVENOUS | Status: AC
  Administered 2012-03-24: 1000 mg via INTRAVENOUS
  Filled 2012-03-24: qty 200

## 2012-03-24 SURGICAL SUPPLY — 42 items
BAG DECANTER FOR FLEXI CONT (MISCELLANEOUS) IMPLANT
BANDAGE ELASTIC 4 VELCRO ST LF (GAUZE/BANDAGES/DRESSINGS) ×2 IMPLANT
BANDAGE GAUZE ELAST BULKY 4 IN (GAUZE/BANDAGES/DRESSINGS) IMPLANT
BNDG COHESIVE 4X5 TAN STRL (GAUZE/BANDAGES/DRESSINGS) IMPLANT
CLOTH BEACON ORANGE TIMEOUT ST (SAFETY) ×2 IMPLANT
COVER SURGICAL LIGHT HANDLE (MISCELLANEOUS) ×2 IMPLANT
CUFF TOURNIQUET SINGLE 18IN (TOURNIQUET CUFF) ×2 IMPLANT
DRSG EMULSION OIL 3X3 NADH (GAUZE/BANDAGES/DRESSINGS) IMPLANT
DRSG PAD ABDOMINAL 8X10 ST (GAUZE/BANDAGES/DRESSINGS) ×4 IMPLANT
ELECT REM PT RETURN 9FT ADLT (ELECTROSURGICAL) ×2
ELECTRODE REM PT RTRN 9FT ADLT (ELECTROSURGICAL) ×1 IMPLANT
GAUZE PACKING IODOFORM 1/2 (PACKING) ×2 IMPLANT
GAUZE XEROFORM 5X9 LF (GAUZE/BANDAGES/DRESSINGS) IMPLANT
GLOVE BIOGEL PI IND STRL 8 (GLOVE) ×1 IMPLANT
GLOVE BIOGEL PI INDICATOR 8 (GLOVE) ×1
GLOVE ORTHO TXT STRL SZ7.5 (GLOVE) ×4 IMPLANT
GOWN PREVENTION PLUS LG XLONG (DISPOSABLE) IMPLANT
GOWN PREVENTION PLUS XLARGE (GOWN DISPOSABLE) ×4 IMPLANT
GOWN STRL NON-REIN LRG LVL3 (GOWN DISPOSABLE) ×2 IMPLANT
HANDPIECE INTERPULSE COAX TIP (DISPOSABLE) ×1
KIT BASIN OR (CUSTOM PROCEDURE TRAY) ×2 IMPLANT
KIT ROOM TURNOVER OR (KITS) ×2 IMPLANT
MANIFOLD NEPTUNE II (INSTRUMENTS) ×2 IMPLANT
NS IRRIG 1000ML POUR BTL (IV SOLUTION) ×2 IMPLANT
PACK ORTHO EXTREMITY (CUSTOM PROCEDURE TRAY) ×2 IMPLANT
PAD ARMBOARD 7.5X6 YLW CONV (MISCELLANEOUS) ×2 IMPLANT
PAD CAST 4YDX4 CTTN HI CHSV (CAST SUPPLIES) ×1 IMPLANT
PADDING CAST COTTON 4X4 STRL (CAST SUPPLIES) ×1
SET HNDPC FAN SPRY TIP SCT (DISPOSABLE) ×1 IMPLANT
SPONGE GAUZE 4X4 12PLY (GAUZE/BANDAGES/DRESSINGS) ×4 IMPLANT
SPONGE LAP 18X18 X RAY DECT (DISPOSABLE) ×2 IMPLANT
SPONGE LAP 4X18 X RAY DECT (DISPOSABLE) IMPLANT
STOCKINETTE IMPERVIOUS 9X36 MD (GAUZE/BANDAGES/DRESSINGS) IMPLANT
SUT ETHILON 4 0 PS 2 18 (SUTURE) IMPLANT
SWAB COLLECTION DEVICE MRSA (MISCELLANEOUS) ×2 IMPLANT
TOWEL OR 17X24 6PK STRL BLUE (TOWEL DISPOSABLE) ×2 IMPLANT
TOWEL OR 17X26 10 PK STRL BLUE (TOWEL DISPOSABLE) ×2 IMPLANT
TUBE ANAEROBIC SPECIMEN COL (MISCELLANEOUS) ×2 IMPLANT
TUBE CONNECTING 12X1/4 (SUCTIONS) ×2 IMPLANT
UNDERPAD 30X30 INCONTINENT (UNDERPADS AND DIAPERS) ×2 IMPLANT
WATER STERILE IRR 1000ML POUR (IV SOLUTION) IMPLANT
YANKAUER SUCT BULB TIP NO VENT (SUCTIONS) ×2 IMPLANT

## 2012-03-24 NOTE — Transfer of Care (Signed)
Immediate Anesthesia Transfer of Care Note  Patient: Joseph [name]  Procedure(s) Performed: Procedure(s) (LRB) with comments: IRRIGATION AND DEBRIDEMENT EXTREMITY (Right) - Right Forearm  Patient Location: PACU  Anesthesia Type: General  Level of Consciousness: awake, alert  and oriented  Airway & Oxygen Therapy: Patient Spontanous Breathing and Patient connected to nasal cannula oxygen  Post-op Assessment: Report given to PACU RN and Post -op Vital signs reviewed and stable  Post vital signs: Reviewed and stable  Complications: No apparent anesthesia complications

## 2012-03-24 NOTE — ED Provider Notes (Signed)
History     CSN: [id number]  Arrival date & time 03/24/12  1333   None     Chief Complaint  Patient presents with  . Right arm abscess swelling and redness     (Consider location/radiation/quality/duration/timing/severity/associated sxs/prior treatment) HPI  31 y.o. male sent from urgent care for evaluation of cellulitis to right arm. Patient was seen on September 27 for abscess to right forearm and incision and drainage performed with packing placed. Patient did not present for 48 hour recheck as packing had fallen out. Cellulitis now engulfs the lower extremity. Patient has a white count of 17.8. Patient denies fever, nausea/vomiting. Patient has remote history of IV drug use with last injection over 6 months ago.  Past Medical History  Diagnosis Date  . Asthma   . Narcotic abuse     History reviewed. No pertinent past surgical history.  No family history on file.  History  Substance Use Topics  . Smoking status: Current Every Day Smoker  . Smokeless tobacco: Not on file  . Alcohol Use: No      Review of Systems  Constitutional: Negative for fever.  Respiratory: Negative for shortness of breath.   Cardiovascular: Negative for chest pain.  Gastrointestinal: Negative for nausea, vomiting, abdominal pain and diarrhea.  Skin: Positive for rash and wound.  All other systems reviewed and are negative.    Allergies  Review of patient's allergies indicates no known allergies.  Home Medications   Current Outpatient Rx  Name Route Sig Dispense Refill  . IBUPROFEN 200 MG PO TABS Oral Take 800 mg by mouth every 6 (six) hours as needed. For pain    . METHADONE HCL 10 MG/ML PO CONC Oral Take 50 mg by mouth daily.      BP 123/75  Pulse 105  Temp 98.8 F (37.1 C) (Oral)  Resp 18  SpO2 96%  Physical Exam  Nursing note and vitals reviewed. Constitutional: He is oriented to person, place, and time. He appears well-developed and well-nourished. No distress.  HENT:    Head: Normocephalic.  Eyes: Conjunctivae normal and EOM are normal.  Cardiovascular: Normal rate.   Pulmonary/Chest: Effort normal. No stridor.  Musculoskeletal: Normal range of motion.  Neurological: He is alert and oriented to person, place, and time.  Skin:       Right forearm: Cellulitis from wrist to above the elbow. Less than 1 cm incision site on lateral radial side actively draining thick green purulent material with no foul smell. Radial pulses intact. Ultrasound is performed by Dr. Garvin Fila shows fluid collection from incision site extending proximally up the arm approximately 10 cm.  Psychiatric: He has a normal mood and affect.    ED Course  Procedures (including critical care time)  Labs Reviewed  CBC WITH DIFFERENTIAL - Abnormal; Notable for the following:    WBC 17.8 (*)     RBC 4.20 (*)     Hemoglobin 12.1 (*)     HCT 36.5 (*)     Neutro Abs 13.0 (*)     Monocytes Absolute 2.1 (*)     All other components within normal limits  BASIC METABOLIC PANEL - Abnormal; Notable for the following:    Glucose, Bld 110 (*)     All other components within normal limits  CULTURE, ROUTINE-ABSCESS   No results found.   1. Cellulitis And Abscess Of Forearm       MDM  Patient with significant cellulitis and fluid collection to right forearm. Patient is  right-hand dominant. Has leukocytosis of 18,000. Consult from a hand surgeon Dr. Ophelia Charter appreciated: he has come to evaluate the patient and will take him to the operating room for exploration. Wound cultures sent and patient will be started on IV vancomycin and Ancef. Patient resting comfortably at this time and informed of care plan.        Wynetta Emery, PA-C 03/24/12 1634

## 2012-03-24 NOTE — Anesthesia Preprocedure Evaluation (Addendum)
Anesthesia Evaluation  Patient identified by MRN, date of birth, ID band Patient awake    Reviewed: Allergy & Precautions, H&P , NPO status , Patient's Chart, lab work & pertinent test results  History of Anesthesia Complications Negative for: history of anesthetic complications  Airway Mallampati: II TM Distance: >3 FB Neck ROM: full    Dental  (+) Teeth Intact and Dental Advisory Given   Pulmonary asthma , Current Smoker,          Cardiovascular     Neuro/Psych negative neurological ROS  negative psych ROS   GI/Hepatic negative GI ROS, Neg liver ROS, (+)     substance abuse  cocaine use,   Endo/Other  negative endocrine ROS  Renal/GU      Musculoskeletal   Abdominal   Peds  Hematology negative hematology ROS (+)   Anesthesia Other Findings   Reproductive/Obstetrics                         Anesthesia Physical Anesthesia Plan  ASA: II  Anesthesia Plan: General   Post-op Pain Management:    Induction: Intravenous  Airway Management Planned: Oral ETT  Additional Equipment:   Intra-op Plan:   Post-operative Plan: Extubation in OR  Informed Consent: I have reviewed the patients History and Physical, chart, labs and discussed the procedure including the risks, benefits and alternatives for the proposed anesthesia with the patient or authorized representative who has indicated his/her understanding and acceptance.     Plan Discussed with: CRNA and Surgeon  Anesthesia Plan Comments:         Anesthesia Quick Evaluation

## 2012-03-24 NOTE — ED Notes (Signed)
Here for recheck of lesion on right forearm. States the packing came out on it's own a couple of days ago, and was doing okay until yesterday, when it started to get more red.  Mild pressure produces pustular material draining from I&D site, arm swollen, red, hot to touch

## 2012-03-24 NOTE — Preoperative (Signed)
Beta Blockers   Reason not to administer Beta Blockers:Not Applicable 

## 2012-03-24 NOTE — ED Provider Notes (Signed)
History     CSN: [id number]  Arrival date & time 03/24/12  0944   None     Chief Complaint  Patient presents with  . Cellulitis    (Consider location/radiation/quality/duration/timing/severity/associated sxs/prior treatment) Patient is a 31 y.o. male presenting with arm injury. The history is provided by the patient. No language interpreter was used.  Arm Injury  The incident occurred just prior to arrival. There is an injury to the right upper arm.   Pt reports he was seen in the ED and had an I and D.  Pt reports packing came out.  Pt reports right arm is swollen and red. Past Medical History  Diagnosis Date  . Asthma   . Narcotic abuse     History reviewed. No pertinent past surgical history.  History reviewed. No pertinent family history.  History  Substance Use Topics  . Smoking status: Current Every Day Smoker  . Smokeless tobacco: Not on file  . Alcohol Use: No      Review of Systems  Unable to perform ROS Musculoskeletal: Positive for myalgias and joint swelling.    Allergies  Review of patient's allergies indicates no known allergies.  Home Medications   Current Outpatient Rx  Name Route Sig Dispense Refill  . METHADONE HCL 10 MG/ML PO CONC Oral Take 50 mg by mouth daily.      BP 134/82  Pulse 82  Temp 98.2 F (36.8 C) (Oral)  Resp 16  SpO2 100%  Physical Exam  Nursing note and vitals reviewed. Constitutional: He is oriented to person, place, and time. He appears well-developed and well-nourished.  HENT:  Head: Normocephalic.  Musculoskeletal: He exhibits tenderness.       Swollen tender right forearm,  Oozing from incision site,  Forearm tight and red  Neurological: He is alert and oriented to person, place, and time. He has normal reflexes.  Skin: There is erythema.  Psychiatric: He has a normal mood and affect.    ED Course  Procedures (including critical care time)  Labs Reviewed - No data to display No results found.   No  diagnosis found.    MDM  Pt to ED for Iv antibiotics        Lonia Skinner [place], [place] 03/24/12 1048

## 2012-03-24 NOTE — Progress Notes (Signed)
ANTIBIOTIC CONSULT NOTE - INITIAL  Pharmacy Consult for vancomycin Indication: forearm abscess  No Known Allergies  Patient Measurements: Height: 5\' 9"  (175.3 cm) Weight: 227 lb 15.3 oz (103.4 kg) IBW/kg (Calculated) : 70.7    Vital Signs: Temp: 97.2 F (36.2 C) (10/02 2047) Temp src: Oral (10/02 1338) BP: 147/81 mmHg (10/02 2042) Pulse Rate: 86  (10/02 2042) Intake/Output from previous day:   Intake/Output from this shift: Total I/O In: 700 [I.V.:700] Out: 30 [Blood:30]  Labs:  [facility] 03/24/12 1341  WBC 17.8*  HGB 12.1*  PLT PLATELET CLUMPS NOTED ON SMEAR, UNABLE TO ESTIMATE  LABCREA --  CREATININE 0.86   Estimated Creatinine Clearance: 147.5 ml/min (by C-G formula based on Cr of 0.86). No results found for this basename: VANCOTROUGH:2,VANCOPEAK:2,VANCORANDOM:2,GENTTROUGH:2,GENTPEAK:2,GENTRANDOM:2,TOBRATROUGH:2,TOBRAPEAK:2,TOBRARND:2,AMIKACINPEAK:2,AMIKACINTROU:2,AMIKACIN:2, in the last 72 hours   Microbiology: No results found for this or any previous visit (from the past 720 hour(s)).  Medical History: Past Medical History  Diagnosis Date  . Asthma   . Narcotic abuse     Medications:  Prescriptions prior to admission  Medication Sig Dispense Refill  . ibuprofen (ADVIL,MOTRIN) 200 MG tablet Take 800 mg by mouth every 6 (six) hours as needed. For pain      . methadone (DOLOPHINE) 10 MG/ML solution Take 50 mg by mouth daily.       Assessment: 31 yo male with a right forearm abscess noted s/p I&D to start vancomycin due to concern of MRSA.  Vancomycin 1000mg  was given earlier today at about 5pm.  Goal of Therapy:  Vancomycin trough level 10-15 mcg/ml  Plan: -Vancomycin 750mg  IV now (to complete a 1750mg  load) followed by 1000mg  IV q12hr -Will follow renal function and clinical progress  Harland German, Pharm D 03/24/2012 9:44 PM

## 2012-03-24 NOTE — ED Notes (Signed)
Or is ready for pt.

## 2012-03-24 NOTE — ED Provider Notes (Signed)
Medical screening examination/treatment/procedure(s) were conducted as a shared visit with non-physician practitioner(s) and myself.  I personally evaluated the patient during the encounter    Nelia Shi, MD 03/24/12 1711

## 2012-03-24 NOTE — Progress Notes (Signed)
Orthopedic Tech Progress Note Patient Details:  Joseph [name] [birth year] [id number]  Ortho Devices Type of Ortho Device: Other (comment) (kuzman sling (elelavator)) Ortho Device/Splint Interventions: Application;Ordered   Jennye Moccasin 03/24/2012, 8:45 PM

## 2012-03-24 NOTE — Brief Op Note (Signed)
03/24/2012  7:46 PM  PATIENT:  Joseph [name]  31 y.o. male  PRE-OPERATIVE DIAGNOSIS:  Right Forearm Abscess  POST-OPERATIVE DIAGNOSIS:  * No post-op diagnosis entered *  PROCEDURE:  Procedure(s) (LRB) with comments: IRRIGATION AND DEBRIDEMENT EXTREMITY (Right) - Right Forearm  SURGEON:  Surgeon(s) and Role:    * Eldred Manges, MD - Primary  PHYSICIAN ASSISTANT:   ASSISTANTS: none   ANESTHESIA:   general  EBL:     BLOOD ADMINISTERED:none  DRAINS: wound packed open with iodoform nugauze    LOCAL MEDICATIONS USED:  NONE  SPECIMEN:  No Specimen  DISPOSITION OF SPECIMEN:  N/A  COUNTS:  YES  TOURNIQUET:  * Missing tourniquet times found for documented tourniquets in log:  16109 *  DICTATION: .Other Dictation: Dictation Number [id number]  PLAN OF CARE: Admit to inpatient   PATIENT DISPOSITION:  PACU - hemodynamically stable.   Delay start of Pharmacological VTE agent (>24hrs) due to surgical blood loss or risk of bleeding: not applicable

## 2012-03-24 NOTE — Anesthesia Postprocedure Evaluation (Signed)
Anesthesia Post Note  Patient: Joseph [name]  Procedure(s) Performed: Procedure(s) (LRB): IRRIGATION AND DEBRIDEMENT EXTREMITY (Right)  Anesthesia type: general  Patient location: PACU  Post pain: Pain level controlled  Post assessment: Patient's Cardiovascular Status Stable  Last Vitals:  Filed Vitals:   03/24/12 1952  BP: 154/82  Pulse: 90  Temp: 36.3 C  Resp: 24    Post vital signs: Reviewed and stable  Level of consciousness: sedated  Complications: No apparent anesthesia complications

## 2012-03-24 NOTE — H&P (Signed)
Joseph [name] is an 31 y.o. male.   Chief Complaint: right forearm abscess with purulent drainage. Greater than 10cm by ultrasound HPI: 31 yo  With visit to ER 4 days ago with tiny abscess with packing placed. Hx of ? Spider bite 2 days before that.   Past Medical History  Diagnosis Date  . Asthma   . Narcotic abuse    states has not used IV drugs in several months.   History reviewed. No pertinent past surgical history.  No family history on file. Social History:  reports that he has been smoking.  He does not have any smokeless tobacco history on file. He reports that he does not drink alcohol or use illicit drugs.  Allergies: No Known Allergies   (Not in a hospital admission)  Results for orders placed during the hospital encounter of 03/24/12 (from the past 48 hour(s))  CBC WITH DIFFERENTIAL     Status: Abnormal   Collection Time   03/24/12  1:41 PM      Component Value Range Comment   WBC 17.8 (*) 4.0 - 10.5 K/uL    RBC 4.20 (*) 4.22 - 5.81 MIL/uL    Hemoglobin 12.1 (*) 13.0 - 17.0 g/dL    HCT 09.8 (*) 11.9 - 52.0 %    MCV 86.9  78.0 - 100.0 fL    MCH 28.8  26.0 - 34.0 pg    MCHC 33.2  30.0 - 36.0 g/dL    RDW 14.7  82.9 - 56.2 %    Platelets PLATELET CLUMPS NOTED ON SMEAR, UNABLE TO ESTIMATE  150 - 400 K/uL    Neutrophils Relative 73  43 - 77 %    Lymphocytes Relative 14  12 - 46 %    Monocytes Relative 12  3 - 12 %    Eosinophils Relative 1  0 - 5 %    Basophils Relative 0  0 - 1 %    Neutro Abs 13.0 (*) 1.7 - 7.7 K/uL    Lymphs Abs 2.5  0.7 - 4.0 K/uL    Monocytes Absolute 2.1 (*) 0.1 - 1.0 K/uL    Eosinophils Absolute 0.2  0.0 - 0.7 K/uL    Basophils Absolute 0.0  0.0 - 0.1 K/uL   BASIC METABOLIC PANEL     Status: Abnormal   Collection Time   03/24/12  1:41 PM      Component Value Range Comment   Sodium 135  135 - 145 mEq/L    Potassium 3.5  3.5 - 5.1 mEq/L    Chloride 97  96 - 112 mEq/L    CO2 28  19 - 32 mEq/L    Glucose, Bld 110 (*) 70 - 99 mg/dL    BUN 8  6 - 23 mg/dL    Creatinine, Ser 1.30  0.50 - 1.35 mg/dL    Calcium 9.3  8.4 - 86.5 mg/dL    GFR calc non Af Amer >90  >90 mL/min    GFR calc Af Amer >90  >90 mL/min    No results found.  Review of Systems  Constitutional: Positive for fever and chills.  HENT: Negative.   Eyes: Negative.   Respiratory: Positive for wheezing.   Cardiovascular: Negative.   Gastrointestinal:       Er visits for GI symptoms, dehydration  Genitourinary: Negative.   Musculoskeletal:       Forearm pain times 6 days right.   Right foream cellulitis past elbow.   Skin: Positive for  rash.  Neurological: Negative.     Blood pressure 123/75, pulse 105, temperature 98.8 F (37.1 C), temperature source Oral, resp. rate 18, SpO2 96.00%. Physical Exam  Constitutional: He is oriented to person, place, and time. He appears well-developed and well-nourished.  HENT:  Head: Normocephalic.  Eyes: Conjunctivae normal are normal. Pupils are equal, round, and reactive to light.  Neck: Normal range of motion. Neck supple.  Cardiovascular: Normal rate.   Respiratory: Effort normal.  GI: Soft.  Musculoskeletal:       1 cm drainage distal radial forearm, flucuant area proximal for 10 to 12 cm.  Cellulitis to elbow and across antecubital space medially.   Neurological: He is alert and oriented to person, place, and time.  Skin: Skin is warm.     Assessment/Plan Acute right forearm abscess, cultures obtained, 2 gm ancef and IV Vanc started. To OR for acute I and D of forearm abscess.   Shiva Karis C 03/24/2012, 4:09 PM

## 2012-03-24 NOTE — ED Notes (Signed)
PT had right arm abscess drained on Thursday or Friday and sent here from ucc for IV antibiotics

## 2012-03-25 ENCOUNTER — Encounter (HOSPITAL_COMMUNITY): Payer: Self-pay | Admitting: Orthopaedic Surgery

## 2012-03-25 MED ORDER — IBUPROFEN 800 MG PO TABS
800.0000 mg | ORAL_TABLET | Freq: Four times a day (QID) | ORAL | Status: DC | PRN
  Administered 2012-03-25: 800 mg via ORAL
  Filled 2012-03-25: qty 1

## 2012-03-25 NOTE — Evaluation (Signed)
Occupational Therapy Evaluation Patient Details Name: Joseph [name] MRN: [id number] DOB: [birth year] Today's Date: 03/25/2012 Time: 9562-1308 OT Time Calculation (min): 25 min  OT Assessment / Plan / Recommendation Clinical Impression  Pt admitted for I & D of R forearm.  Will d/c home with family member.  Instruction in edema management, finger ROM, and hemitechniques for ADL completed.  No further OT needs.    OT Assessment  Patient does not need any further OT services    Follow Up Recommendations  No OT follow up    Barriers to Discharge      Equipment Recommendations  None recommended by OT    Recommendations for Other Services    Frequency       Precautions / Restrictions     Pertinent Vitals/Pain 7/10 R UE, RN made aware    ADL  Eating/Feeding: Performed;Set up Where Assessed - Eating/Feeding: Bed level Grooming: Performed;Wash/dry face;Supervision/safety Where Assessed - Grooming: Unsupported standing Upper Body Bathing: Simulated;Minimal assistance Where Assessed - Upper Body Bathing: Unsupported sitting Lower Body Bathing: Simulated;Supervision/safety Where Assessed - Lower Body Bathing: Unsupported sit to stand Upper Body Dressing: Performed;Modified independent Where Assessed - Upper Body Dressing: Unsupported sitting Lower Body Dressing: Simulated;Supervision/safety Where Assessed - Lower Body Dressing: Unsupported sit to stand Toilet Transfer: Simulated;Supervision/safety Toilet Transfer Method: Sit to stand Transfers/Ambulation Related to ADLs: supervision due to IV line and pain meds ADL Comments: Instructed in one handed techniques for bathing and dressing.  Instructed in elevation and finger ROM for edema management.  Issued squeeze ball.    OT Diagnosis:    OT Problem List:   OT Treatment Interventions:     OT Goals    Visit Information  Last OT Received On: 03/25/12 Assistance Needed: +1    Subjective Data  Subjective: "I'm was putting  it up on the back of the couch to elevate it at home." Patient Stated Goal: Pt plans to go home with grandmother.   Prior Functioning     Home Living Lives With: Significant other Available Help at Discharge: Family;Available 24 hours/day;Friend(s) Type of Home: House Prior Function Level of Independence: Independent Driving: Yes Communication Communication: No difficulties Dominant Hand: Right         Vision/Perception     Cognition  Overall Cognitive Status: Appears within functional limits for tasks assessed/performed Arousal/Alertness: Lethargic Orientation Level: Appears intact for tasks assessed Behavior During Session: Lethargic    Extremity/Trunk Assessment Right Upper Extremity Assessment RUE ROM/Strength/Tone: Unable to fully assess (due to dressing, shoulder WNL) Left Upper Extremity Assessment LUE ROM/Strength/Tone: Within functional levels Trunk Assessment Trunk Assessment: Normal     Mobility Bed Mobility Bed Mobility: Sit to Supine;Supine to Sit Supine to Sit: 6: Modified independent (Device/Increase time) Sit to Supine: 6: Modified independent (Device/Increase time) Transfers Transfers: Sit to Stand;Stand to Sit Sit to Stand: 5: Supervision;With upper extremity assist;From bed Stand to Sit: To bed;5: Supervision     Shoulder Instructions     Exercise     Balance     End of Session OT - End of Session Activity Tolerance: Patient tolerated treatment well Patient left: in bed;with call bell/phone within reach;with family/visitor present Nurse Communication: Patient requests pain meds;Other (comment) (IV complete)  GO     Joseph [name] 03/25/2012, 1:50 PM [phone]

## 2012-03-25 NOTE — Op Note (Signed)
Joseph [name], Joseph [name] NO.:  [id number]  MEDICAL RECORD NO.:  [id number]  LOCATION:  [id number]                        FACILITY:  [facility]  PHYSICIAN:  Mark C. Ophelia Charter, M.D.    DATE OF BIRTH:  07/29/1980  DATE OF PROCEDURE:  03/24/2012 DATE OF DISCHARGE:                              OPERATIVE REPORT   PREOPERATIVE DIAGNOSIS:  Right forearm abscess.  POSTOPERATIVE DIAGNOSIS:  Right forearm abscess.  PROCEDURE:  Irrigation and drainage, right forearm abscess.  SURGEON:  Mark C. Ophelia Charter, M.D.  ANESTHESIA:  General.  ESTIMATED BLOOD LOSS:  Minimal.  TOURNIQUET NONE:  None.  BRIEF HISTORY:  This 31 year old male thought he might have a spider bite 6 days ago.  Four days ago, seen in the emergency room, had small area open in the emergency room by the PA and small tiny piece of packing was placed with just a small amount of purulence.  He returned 4 days later with red, swollen, firm, fluctuance that extends 10-12 cm proximally with the width about 6-8 cm red streaks running up his arm pass his elbow with fever,  white count of 17,000+ and obvious abscess on ultrasound.  PROCEDURE:  After induction of general anesthesia and orotracheal intubation, the patient already had 2 g of Ancef and had vancomycin, which was infusing.  Proximal arm tourniquet was applied, but was not used during the case.  Arm was prepped with DuraPrep.  There was a transverse 1 cm area that was draining purulence and with pressure on the form.  After prepping and draping, pus would come out.  This was extended approximately 2 cm.  Weitlaner retractor placed and then cultures were obtained from deep in the pocket.  Suction was then placed anchor up inside and the pocket was suctioned out a pus.  Procedure performed was irrigation and no sharp excisional debridement was performed.  Incision was made proximally, another opening and sucker tip was placed with the two pockets would communicate and  pulsatile irrigator was used to irrigate in one area and out the other.  Finger dissection was used to follow the pocket out to the edges 2-3 cm on each side, developed the pocket and then suctioning dry, looking to see if there was any further purulence.  Repeat pulsatile lavage irrigation until the 3-litter bag was empty.  Again, attempts were made to milk from distal to proximal and proximal to distal from the edges and with no further purulence.  Visual inspection showed that I think the remaining was small amount of irrigation fluid.  Iodoform, Nu Gauze 0.5- inch was then taken and about half of it was packed from distal to proximal and was left hanging out the distal edge, proximal.  Wound was reapproximated 2/3 with some staples.  Distally, only one staple was placed and the Nu Gauze was left out.  So, it could be removed and changed by the wound care team.  Twiners, 4x4s, ABDs, Webril, and Ace wrap were applied for soft dressing.  The patient received IV antibiotics specific based on culture results.     Mark C. Ophelia Charter, M.D.     MCY/MEDQ  D:  03/24/2012  T:  03/25/2012  Job:  [id number] 

## 2012-03-25 NOTE — Progress Notes (Signed)
Subjective: 1 Day Post-Op Procedure(s) (LRB): IRRIGATION AND DEBRIDEMENT EXTREMITY (Right) Patient reports pain as mild.    Objective: Vital signs in last 24 hours: Temp:  [97.2 F (36.2 C)-100.4 F (38 C)] 97.8 F (36.6 C) (10/03 0936) Pulse Rate:  [67-105] 67  (10/03 0936) Resp:  [16-24] 20  (10/03 0936) BP: (119-158)/(55-82) 135/65 mmHg (10/03 0936) SpO2:  [92 %-99 %] 99 % (10/03 0936) Weight:  [103.4 kg (227 lb 15.3 oz)] 103.4 kg (227 lb 15.3 oz) (10/02 2121)  Intake/Output from previous day: 10/02 0701 - 10/03 0700 In: 700 [I.V.:700] Out: 30 [Blood:30] Intake/Output this shift: Total I/O In: 120 [P.O.:120] Out: -    Basename 03/24/12 1341  HGB 12.1*    Basename 03/24/12 1341  WBC 17.8*  RBC 4.20*  HCT 36.5*  PLT PLATELET CLUMPS NOTED ON SMEAR, UNABLE TO ESTIMATE    Basename 03/24/12 1341  NA 135  K 3.5  CL 97  CO2 28  BUN 8  CREATININE 0.86  GLUCOSE 110*  CALCIUM 9.3   No results found for this basename: LABPT:2,INR:2 in the last 72 hours  Neurovascular intact of right hand.  Dressing dry and intact.  Less erythema around scribed area of upper arm.  Still with edema  Assessment/Plan: 1 Day Post-Op Procedure(s) (LRB): IRRIGATION AND DEBRIDEMENT EXTREMITY (Right) Continue elevation and motion of fingers Continue IV abx  VERNON,SHEILA M 03/25/2012, 12:53 PM  GRAM STAIN --GRAM POSITIVE IN PAIRS IN CHAINS.    He is covered for strept. Will know more tomorrow.

## 2012-03-25 NOTE — ED Provider Notes (Signed)
Medical screening examination/treatment/procedure(s) were performed by non-physician practitioner and as supervising physician I was immediately available for consultation/collaboration.  Leslee Home, M.D.   Reuben Likes, MD 03/25/12 1059

## 2012-03-26 LAB — BASIC METABOLIC PANEL
CO2: 29 mEq/L (ref 19–32)
Chloride: 102 mEq/L (ref 96–112)
Creatinine, Ser: 0.78 mg/dL (ref 0.50–1.35)
Glucose, Bld: 127 mg/dL — ABNORMAL HIGH (ref 70–99)
Sodium: 137 mEq/L (ref 135–145)

## 2012-03-26 LAB — CBC
Hemoglobin: 10.5 g/dL — ABNORMAL LOW (ref 13.0–17.0)
MCV: 89.1 fL (ref 78.0–100.0)
Platelets: 227 10*3/uL (ref 150–400)
RBC: 3.75 MIL/uL — ABNORMAL LOW (ref 4.22–5.81)
WBC: 8.3 10*3/uL (ref 4.0–10.5)

## 2012-03-26 LAB — CULTURE, ROUTINE-ABSCESS

## 2012-03-26 MED ORDER — OXYCODONE-ACETAMINOPHEN 5-325 MG PO TABS: 1.0000 | ORAL_TABLET | ORAL | Status: DC | PRN

## 2012-03-26 MED ORDER — CEPHALEXIN 500 MG PO CAPS: 500.0000 mg | ORAL_CAPSULE | Freq: Four times a day (QID) | ORAL | Status: DC

## 2012-03-26 NOTE — Progress Notes (Signed)
ANTIBIOTIC CONSULT NOTE - FOLLOW UP  Pharmacy Consult for Vancomycin Indication: R arm abscess  No Known Allergies  Patient Measurements: Height: 5\' 9"  (175.3 cm) Weight: 227 lb 15.3 oz (103.4 kg) IBW/kg (Calculated) : 70.7   Vital Signs: Temp: 98.7 F (37.1 C) (10/04 1039) Temp src: Oral (10/04 1039) BP: 151/70 mmHg (10/04 1039) Pulse Rate: 79  (10/04 1039) Intake/Output from previous day: 10/03 0701 - 10/04 0700 In: 3847.5 [P.O.:600; I.V.:2597.5; IV Piggyback:650] Out: -  Intake/Output from this shift:    Labs:  Basename 03/26/12 0555 03/24/12 1341  WBC 8.3 17.8*  HGB 10.5* 12.1*  PLT 227 PLATELET CLUMPS NOTED ON SMEAR, UNABLE TO ESTIMATE  LABCREA -- --  CREATININE 0.78 0.86   Estimated Creatinine Clearance: 158.6 ml/min (by C-G formula based on Cr of 0.78). No results found for this basename: VANCOTROUGH:2,VANCOPEAK:2,VANCORANDOM:2,GENTTROUGH:2,GENTPEAK:2,GENTRANDOM:2,TOBRATROUGH:2,TOBRAPEAK:2,TOBRARND:2,AMIKACINPEAK:2,AMIKACINTROU:2,AMIKACIN:2, in the last 72 hours   Microbiology: Recent Results (from the past 720 hour(s))  CULTURE, ROUTINE-ABSCESS     Status: Normal (Preliminary result)   Collection Time   03/24/12  4:09 PM      Component Value Range Status Comment   Specimen Description ABSCESS ARM   Final    Special Requests NONE   Final    Gram Stain     Final    Value: FEW WBC PRESENT, PREDOMINANTLY PMN     RARE SQUAMOUS EPITHELIAL CELLS PRESENT     FEW GRAM POSITIVE COCCI IN PAIRS   Culture NO GROWTH 1 DAY   Final    Report Status PENDING   Incomplete   CULTURE, ROUTINE-ABSCESS     Status: Normal (Preliminary result)   Collection Time   03/24/12  7:56 PM      Component Value Range Status Comment   Specimen Description ABSCESS FOREARM RIGHT   Final    Special Requests PATIENT ON FOLLOWING VANCOMYCIN,ANCEF   Final    Gram Stain     Final    Value: ABUNDANT WBC PRESENT, PREDOMINANTLY PMN     NO SQUAMOUS EPITHELIAL CELLS SEEN     ABUNDANT GRAM POSITIVE  COCCI     IN PAIRS IN CHAINS   Culture NO GROWTH   Final    Report Status PENDING   Incomplete   ANAEROBIC CULTURE     Status: Normal (Preliminary result)   Collection Time   03/24/12  7:56 PM      Component Value Range Status Comment   Specimen Description ABSCESS FOREARM RIGHT   Final    Special Requests PATIENT ON FOLLOWING VANCOMYCIN,ANCEF   Final    Gram Stain     Final    Value: ABUNDANT WBC PRESENT, PREDOMINANTLY PMN     NO SQUAMOUS EPITHELIAL CELLS SEEN     ABUNDANT GRAM POSITIVE COCCI     IN PAIRS IN CHAINS   Culture     Final    Value: NO ANAEROBES ISOLATED; CULTURE IN PROGRESS FOR 5 DAYS   Report Status PENDING   Incomplete     Anti-infectives     Start     Dose/Rate Route Frequency Ordered Stop   03/25/12 1100   vancomycin (VANCOCIN) IVPB 1000 mg/200 mL premix        1,000 mg 200 mL/hr over 60 Minutes Intravenous Every 12 hours 03/24/12 2146     03/24/12 2359   ceFAZolin (ANCEF) IVPB 2 g/50 mL premix        2 g 100 mL/hr over 30 Minutes Intravenous 3 times per day 03/24/12 2107 03/27/12 2159  03/24/12 2230   vancomycin (VANCOCIN) 750 mg in sodium chloride 0.9 % 150 mL IVPB        750 mg 150 mL/hr over 60 Minutes Intravenous  Once 03/24/12 2146 03/25/12 0112   03/24/12 1607   ceFAZolin (ANCEF) IVPB 2 g/50 mL premix        2 g 100 mL/hr over 30 Minutes Intravenous  Once 03/24/12 1607 03/24/12 1645   03/24/12 1545   vancomycin (VANCOCIN) IVPB 1000 mg/200 mL premix        1,000 mg 200 mL/hr over 60 Minutes Intravenous  Once 03/24/12 1540 03/24/12 1750          Assessment: 31 y.o. F started on Vancomycin for empiric coverage of R arm abscess, now s/p I&D on 10/2. Abscess culture gram stain shows GPC -- however is pending further results/speciation. The patient was loaded with Vancomycin on 10/2. Renal function has been stable -- doses remain appropriate at this time  Goal of Therapy:  Vancomycin trough level 10-15 mcg/ml Proper antibiotics for  infection/cultures adjusted for renal/hepatic function   Plan:  1. Continue Vancomycin 1g IV every 12 hours 2. Will continue to follow renal function, culture results, LOT, and antibiotic de-escalation plans   Georgina Pillion, PharmD, BCPS Clinical Pharmacist Pager: [phone] 03/26/2012 11:15 AM

## 2012-03-26 NOTE — Care Management Note (Signed)
    Page 1 of 1   03/29/2012     3:38:23 PM   CARE MANAGEMENT NOTE 03/29/2012  Patient:  Joseph [name], Joseph [name]   Account Number:  [id number]  Date Initiated:  03/26/2012  Documentation initiated by:  Onnie Boer  Subjective/Objective Assessment:   PT WAS ADMITTED WITH AN ABSCESS.     Action/Plan:   PROGRESSION OF CARE AND DISCHARGE PLANNING   Anticipated DC Date:  03/28/2012   Anticipated DC Plan:  HOME/SELF CARE      DC Planning Services  CM consult      Choice offered to / List presented to:             Status of service:  Completed, signed off Medicare Important Message given?   (If response is "NO", the following Medicare IM given date fields will be blank) Date Medicare IM given:   Date Additional Medicare IM given:    Discharge Disposition:  HOME/SELF CARE  Per UR Regulation:  Reviewed for med. necessity/level of care/duration of stay  If discussed at Long Length of Stay Meetings, dates discussed:    Comments:  03/26/12 Onnie Boer, RN, BSN 1243 PT WAS ADMITTED WITH AN ABCESS ON HER ELBOW. SHE HAS HAS AN I&D AND IS ON IV ABX.  WILL F/U ON DC NEEDS AND RECOMMENDATIONS.

## 2012-03-26 NOTE — Consult Note (Signed)
WOC consult Note Reason for Consult: repack I & D site of the right forearm.  Wound type: I & D site Measurement: 3.5cm x 2.0cm x 3.5 cm tunneling at 12 o'clock  Wound bed: pink, moist Drainage (amount, consistency, odor) bloody with some thick yellow red drainage when I irrigated Periwound: intact but forearm does have edema of the arm and fingers Dressing procedure/placement/frequency: irrigated the wound with normal saline, packed the wound with iodoform gauze, covered with 4x4s and ABD, wrapped with conform and ACE. Pt to follow up in 4 days with Dr. Ophelia Charter, instructed to leave dressing in place until follow up appt. Instructed pt and caregiver at bedside on use of sling, we applied the sling and adjusted for pt comfort.   Re consult if needed, will not follow at this time. Thanks  Vian Fluegel [employer], CWOCN [phone])

## 2012-03-26 NOTE — Progress Notes (Signed)
Pt d/c instructions provided. Pt verbalized understanding. Pt iv d/c intact. Pt under no s/s distress. 

## 2012-03-26 NOTE — Discharge Summary (Signed)
Physician Discharge Summary  Patient ID: Joseph [name] MRN: [id number] DOB/AGE: 30/16/1982 31 y.o.  Admit date: 03/24/2012 Discharge date: 03/26/2012  Admission Diagnoses:right forearm abscess  Discharge Diagnoses: same--- cultures strept Active Problems:  Abscess of right forearm   Discharged Condition: stable  Hospital Course: underwent I and D  In OR , IV ABX. Cultures strept.   Consults: wound care nurse  Significant Diagnostic Studies:   Treatments: antibiotics: vancomycin  Discharge Exam: Blood pressure 151/70, pulse 79, temperature 98.7 F (37.1 C), temperature source Oral, resp. rate 20, height 5\' 9"  (1.753 m), weight 103.4 kg (227 lb 15.3 oz), SpO2 97.00%.   Disposition: 01-Home or Self Care     Medication List     As of 03/26/2012 12:42 PM    TAKE these medications         cephALEXin 500 MG capsule   Commonly known as: KEFLEX   Take 1 capsule (500 mg total) by mouth 4 (four) times daily.      ibuprofen 200 MG tablet   Commonly known as: ADVIL,MOTRIN   Take 800 mg by mouth every 6 (six) hours as needed. For pain      methadone 10 MG/ML solution   Commonly known as: DOLOPHINE   Take 50 mg by mouth daily.      oxyCODONE-acetaminophen 5-325 MG per tablet   Commonly known as: PERCOCET/ROXICET   Take 1-2 tablets by mouth every 4 (four) hours as needed.           Follow-up Information    Follow up with YATES,MARK C, MD. In 4 days.   Contact information:   [facility] [address] [place] [address] [phone]          Signed: Eldred Manges 03/26/2012, 12:42 PM

## 2012-03-27 LAB — CULTURE, ROUTINE-ABSCESS

## 2012-03-29 LAB — ANAEROBIC CULTURE

## 2012-04-23 ENCOUNTER — Emergency Department (HOSPITAL_BASED_OUTPATIENT_CLINIC_OR_DEPARTMENT_OTHER)
Admission: EM | Admit: 2012-04-23 | Discharge: 2012-04-24 | Disposition: A | Discharge status: Home / Self Care | Payer: Self-pay | Attending: Emergency Medicine | Admitting: Emergency Medicine

## 2012-04-23 ENCOUNTER — Encounter (HOSPITAL_BASED_OUTPATIENT_CLINIC_OR_DEPARTMENT_OTHER): Payer: Self-pay

## 2012-04-23 DIAGNOSIS — Z79899 Other long term (current) drug therapy: Secondary | ICD-10-CM | POA: Insufficient documentation

## 2012-04-23 DIAGNOSIS — F1910 Other psychoactive substance abuse, uncomplicated: Secondary | ICD-10-CM

## 2012-04-23 DIAGNOSIS — F411 Generalized anxiety disorder: Secondary | ICD-10-CM | POA: Insufficient documentation

## 2012-04-23 DIAGNOSIS — F329 Major depressive disorder, single episode, unspecified: Secondary | ICD-10-CM

## 2012-04-23 DIAGNOSIS — F1410 Cocaine abuse, uncomplicated: Secondary | ICD-10-CM | POA: Insufficient documentation

## 2012-04-23 DIAGNOSIS — F3289 Other specified depressive episodes: Secondary | ICD-10-CM | POA: Insufficient documentation

## 2012-04-23 DIAGNOSIS — F17200 Nicotine dependence, unspecified, uncomplicated: Secondary | ICD-10-CM | POA: Insufficient documentation

## 2012-04-23 DIAGNOSIS — J45909 Unspecified asthma, uncomplicated: Secondary | ICD-10-CM | POA: Insufficient documentation

## 2012-04-23 LAB — URINALYSIS, ROUTINE W REFLEX MICROSCOPIC
Glucose, UA: NEGATIVE mg/dL
Hgb urine dipstick: NEGATIVE
Protein, ur: NEGATIVE mg/dL

## 2012-04-23 LAB — CBC WITH DIFFERENTIAL/PLATELET
Basophils Absolute: 0 10*3/uL (ref 0.0–0.1)
Eosinophils Absolute: 0.2 10*3/uL (ref 0.0–0.7)
Eosinophils Relative: 2 % (ref 0–5)
MCH: 29.2 pg (ref 26.0–34.0)
MCV: 86.9 fL (ref 78.0–100.0)
Platelets: 204 10*3/uL (ref 150–400)
RDW: 13.2 % (ref 11.5–15.5)
WBC: 9 10*3/uL (ref 4.0–10.5)

## 2012-04-23 LAB — RAPID URINE DRUG SCREEN, HOSP PERFORMED
Amphetamines: NOT DETECTED
Opiates: NOT DETECTED
Tetrahydrocannabinol: NOT DETECTED

## 2012-04-23 LAB — COMPREHENSIVE METABOLIC PANEL
ALT: 15 U/L (ref 0–53)
AST: 12 U/L (ref 0–37)
Calcium: 9.5 mg/dL (ref 8.4–10.5)
Sodium: 144 mEq/L (ref 135–145)
Total Protein: 8 g/dL (ref 6.0–8.3)

## 2012-04-23 NOTE — ED Provider Notes (Signed)
History     CSN: [id number]  Arrival date & time 04/23/12  1815   First MD Initiated Contact with Patient 04/23/12 2100      Chief Complaint  Patient presents with  . Anxiety  . Depression    (Consider location/radiation/quality/duration/timing/severity/associated sxs/prior treatment) Patient is a 31 y.o. male presenting with mental health disorder. The history is provided by the patient. No language interpreter was used.  Mental Health Problem The primary symptoms include dysphoric mood. Episode onset: 5 days. This is a recurrent problem.  The degree of incapacity that he is experiencing as a consequence of his illness is moderate. He does not admit to suicidal ideas. He does not have a plan to commit suicide. He does not contemplate harming himself. He has not already injured self. Risk factors that are present for mental illness include substance abuse.  Pt reports he has a history of substance abuse.   Pt reports he has been taking whatever he can.  Pt reports he has been depressed recently because he has been out of work.  Pt was going to methadone clinic but reports he decreased use until he can stop.  Pt denies any withdrawal. Pt is here with 2 family members.  They are requesting pt go into an inpatient treatment facility.  [facility]. Pt reports he does not think it is helping Past Medical History  Diagnosis Date  . Asthma   . Narcotic abuse     Past Surgical History  Procedure Date  . I&d extremity 03/24/2012    Procedure: IRRIGATION AND DEBRIDEMENT EXTREMITY;  Surgeon: Eldred Manges, MD;  Location: [facility];  Service: Orthopedics;  Laterality: Right;  Right Forearm    No family history on file.  History  Substance Use Topics  . Smoking status: Current Every Day Smoker -- 0.5 packs/day for 10 years    Types: Cigarettes  . Smokeless tobacco: Not on file  . Alcohol Use: Yes     occasional- last beer PTA      Review of Systems   Psychiatric/Behavioral: Positive for disturbed wake/sleep cycle and dysphoric mood. Negative for suicidal ideas. The patient is nervous/anxious.   All other systems reviewed and are negative.    Allergies  Review of patient's allergies indicates no known allergies.  Home Medications   Current Outpatient Rx  Name Route Sig Dispense Refill  . CEPHALEXIN 500 MG PO CAPS Oral Take 1 capsule (500 mg total) by mouth 4 (four) times daily. 40 capsule 1  . IBUPROFEN 200 MG PO TABS Oral Take 800 mg by mouth every 6 (six) hours as needed. For pain      BP 145/79  Pulse 83  Temp 98.7 F (37.1 C) (Oral)  Resp 16  Ht 5\' 10"  (1.778 m)  Wt 240 lb (108.863 kg)  BMI 34.44 kg/m2  SpO2 99%  Physical Exam  Nursing note and vitals reviewed. Constitutional: He is oriented to person, place, and time. He appears well-developed and well-nourished.  HENT:  Head: Normocephalic and atraumatic.  Eyes: Conjunctivae normal and EOM are normal. Pupils are equal, round, and reactive to light.  Neck: Normal range of motion.  Cardiovascular: Normal rate.   Pulmonary/Chest: Effort normal and breath sounds normal.  Abdominal: Soft. Bowel sounds are normal.  Musculoskeletal: Normal range of motion.  Neurological: He is alert and oriented to person, place, and time.  Skin: Skin is warm.  Psychiatric: He has a normal mood and affect.  ED Course  Procedures (including critical care time)  Labs Reviewed  URINALYSIS, ROUTINE W REFLEX MICROSCOPIC - Abnormal; Notable for the following:    Color, Urine AMBER (*)  BIOCHEMICALS MAY BE AFFECTED BY COLOR   Specific Gravity, Urine 1.031 (*)     Bilirubin Urine SMALL (*)     All other components within normal limits  URINE RAPID DRUG SCREEN (HOSP PERFORMED) - Abnormal; Notable for the following:    Cocaine POSITIVE (*)     All other components within normal limits   No results found.   No diagnosis found.    MDM  Mobile crisis to see here.   Pt is not  suicidal or homicidal.  Inpatient treatment would be voluntary.   Pt reports he wants to go into treatment.          Lonia Skinner [place], [place] 04/23/12 2131  Lonia Skinner [place], [place] 04/23/12 2132

## 2012-04-23 NOTE — ED Notes (Signed)
Pt reports anxiety and depression that has worsened over the past 5 days.  States symptoms increased after he stopped going to the methadone clinic 1 week ago.  Denies SI/HI.

## 2012-04-24 MED ORDER — ZOLPIDEM TARTRATE 5 MG PO TABS: 5.0000 mg | ORAL_TABLET | Freq: Every evening | ORAL | Status: DC | PRN

## 2012-04-24 NOTE — Discharge Instructions (Signed)
We saw you in the ER for your mental health concerns and had our behavioral health team evaluate you. The team feels comfortable sending you home, please follow the recommendations given to you by them and the follow-up and medications they have prescribed to you. Please refrain from substance abuse. Return to the ER if your symptoms worsen.  Depression  Depression is a strong emotion of feeling unhappy that can last for weeks, months, or even longer. Depression causes problems with the ability to function in life. It upsets your:   Relationships.  Sleep.  Eating habits.  Work habits. HOME CARE  Take all medicine as told by your doctor.  Talk with a therapist, counselor, or friend.  Eat a healthy diet.  Exercise regularly.  Do not drink alcohol or use drugs. GET HELP RIGHT AWAY IF: You start to have thoughts about hurting yourself or others. MAKE SURE YOU:  Understand these instructions.  Will watch your condition.  Will get help right away if you are not doing well or get worse. Document Released: 07/12/2010 Document Revised: 09/01/2011 Document Reviewed: 07/12/2010 [facility] Patient Information 2013 [place], [place]. Substance Abuse Your exam indicates that you have a problem with substance abuse. Substance abuse is the misuse of alcohol or drugs that causes problems in family life, friendships, and work relationships. Substance abuse is the most important cause of premature illness, disability, and death in our society. It is also the greatest threat to a person's mental and spiritual well being. Substance abuse can start out in an innocent way, such as social drinking or taking a little extra medication prescribed by your doctor. No one starts out with the intention of becoming an alcoholic or an addict. Substance abuse victims cannot control their use of alcohol or drugs. They may become intoxicated daily or go on weekend binges. Often there is a strong desire to quit, but  attempts to stop using often fail. Encounters with law enforcement or conflicts with family members, friends, and work associates are signs of a potential problem. Recovery is always possible, although the craving for some drugs makes it difficult to quit without assistance. Many treatment programs are available to help people stop abusing alcohol or drugs. The first step in treatment is to admit you have a problem. This is a major hurdle because denial is a powerful force with substance abuse. Alcoholics Anonymous, Narcotics Anonymous, Cocaine Anonymous, and other recovery groups and programs can be very useful in helping people to quit. If you do not feel okay about your drug or alcohol use and if it is causing you trouble, we want to encourage you to talk about it with your doctor or with someone from a recovery group who can help you. You could also call the [employer] on Drug Abuse at 1-800-662-HELP. It is up to you to take the first step. AL-ANON and ALA-TEEN are support groups for friends and family members of an alcohol or drug dependent person. The people who love and care for the alcoholic or addicted person often need help, too. For information about these organizations, check your phone directory or call a local alcohol or drug treatment center. Document Released: 07/17/2004 Document Revised: 09/01/2011 Document Reviewed: 06/10/2008 [facility] Patient Information 2013 [place], [place].

## 2012-04-24 NOTE — ED Provider Notes (Signed)
Medical screening examination/treatment/procedure(s) were performed by non-physician practitioner and as supervising physician I was immediately available for consultation/collaboration.  Maryfer Tauzin, MD 04/24/12 0103 

## 2012-04-24 NOTE — ED Provider Notes (Signed)
ACT team evaluated the patient. Outpatient resources provided, with a follow up on Monday at [facility]. Pt was never suicidal or homicidal. For cocaine - there is no detox required, and when symptom free like right now, nothing further to do. Will d.c.  Derwood Kaplan, MD 04/24/12 [phone]

## 2012-11-10 ENCOUNTER — Emergency Department (HOSPITAL_BASED_OUTPATIENT_CLINIC_OR_DEPARTMENT_OTHER)
Admission: EM | Admit: 2012-11-10 | Discharge: 2012-11-10 | Disposition: A | Discharge status: Home / Self Care | Payer: Self-pay | Attending: Emergency Medicine | Admitting: Emergency Medicine

## 2012-11-10 ENCOUNTER — Encounter (HOSPITAL_BASED_OUTPATIENT_CLINIC_OR_DEPARTMENT_OTHER): Payer: Self-pay

## 2012-11-10 DIAGNOSIS — K612 Anorectal abscess: Secondary | ICD-10-CM | POA: Insufficient documentation

## 2012-11-10 DIAGNOSIS — K611 Rectal abscess: Secondary | ICD-10-CM

## 2012-11-10 DIAGNOSIS — J45909 Unspecified asthma, uncomplicated: Secondary | ICD-10-CM | POA: Insufficient documentation

## 2012-11-10 DIAGNOSIS — F17200 Nicotine dependence, unspecified, uncomplicated: Secondary | ICD-10-CM | POA: Insufficient documentation

## 2012-11-10 MED ORDER — HYDROCODONE-ACETAMINOPHEN 5-325 MG PO TABS: 1.0000 | ORAL_TABLET | Freq: Four times a day (QID) | ORAL | Status: DC | PRN

## 2012-11-10 MED ORDER — SENNOSIDES-DOCUSATE SODIUM 8.6-50 MG PO TABS: 2.0000 | ORAL_TABLET | Freq: Every day | ORAL | Status: DC

## 2012-11-10 NOTE — ED Provider Notes (Signed)
History     CSN: [id number]  Arrival date & time 11/10/12  1124   First MD Initiated Contact with Patient 11/10/12 1147      Chief Complaint  Patient presents with  . Rectal Pain    HPI  Patient presents with concerns of rectal pain. Pain began approximately 4 days ago.  Since onset has been persistent, worse with walking, sitting, defecating. No new fevers, chills, diarrhea, rectal bleeding, scrotal or penile complaints. No relief with anything. He is generally healthy, but does have a history of one prior gluteal cleft abscess. That required drainage approximately 4 years ago.   Past Medical History  Diagnosis Date  . Asthma   . Narcotic abuse     Past Surgical History  Procedure Laterality Date  . I&d extremity  03/24/2012    Procedure: IRRIGATION AND DEBRIDEMENT EXTREMITY;  Surgeon: Eldred Manges, MD;  Location: [facility] OR;  Service: Orthopedics;  Laterality: Right;  Right Forearm    History reviewed. No pertinent family history.  History  Substance Use Topics  . Smoking status: Current Every Day Smoker -- 0.50 packs/day for 10 years    Types: Cigarettes  . Smokeless tobacco: Not on file  . Alcohol Use: Yes     Comment: occasional- last beer PTA      Review of Systems  Constitutional: Negative for fever.  Cardiovascular: Negative for chest pain.  Gastrointestinal: Positive for rectal pain. Negative for vomiting, abdominal pain, diarrhea, blood in stool and anal bleeding.  Genitourinary: Negative.   Musculoskeletal: Negative for back pain.  Skin:       hpi  Neurological: Negative.     Allergies  Review of patient's allergies indicates no known allergies.  Home Medications   Current Outpatient Rx  Name  Route  Sig  Dispense  Refill  . cephALEXin (KEFLEX) 500 MG capsule   Oral   Take 1 capsule (500 mg total) by mouth 4 (four) times daily.   40 capsule   1   . HYDROcodone-acetaminophen (NORCO/VICODIN) 5-325 MG per tablet   Oral   Take 1-2 tablets by  mouth every 6 (six) hours as needed for pain.   15 tablet   0   . ibuprofen (ADVIL,MOTRIN) 200 MG tablet   Oral   Take 800 mg by mouth every 6 (six) hours as needed. For pain         . senna-docusate (SENOKOT-S) 8.6-50 MG per tablet   Oral   Take 2 tablets by mouth daily.   30 tablet   0   . zolpidem (AMBIEN) 5 MG tablet   Oral   Take 1 tablet (5 mg total) by mouth at bedtime as needed for sleep.   3 tablet   0     BP 117/63  Pulse 76  Temp(Src) 98.9 F (37.2 C) (Oral)  Resp 18  SpO2 99%  Physical Exam  Nursing note and vitals reviewed. Constitutional: He is oriented to person, place, and time. He appears well-developed. No distress.  HENT:  Head: Normocephalic and atraumatic.  Eyes: Conjunctivae and EOM are normal.  Cardiovascular: Normal rate and regular rhythm.   Pulmonary/Chest: Effort normal. No stridor. No respiratory distress.  Abdominal: He exhibits no distension.  Genitourinary: Rectal exam shows mass and tenderness. Rectal exam shows no external hemorrhoid and no fissure.     Musculoskeletal: He exhibits no edema.  Neurological: He is alert and oriented to person, place, and time.  Skin: Skin is warm and dry.  Psychiatric:  He has a normal mood and affect.    ED Course  Procedures (including critical care time)  Labs Reviewed - No data to display No results found.   1. Peri-rectal abscess       MDM  This generally well-appearing young male presents with rectal pain.  On exam there is evidence of a small perirectal abscess, tender, but not amenable to drainage.  Given the absence of abdominal pain, fever, changes in bowel habits, there is low suspicion for early fistula, or systemic infection. With the nonviability of current drainage, we discussed at length wound care instructions, follow up instructions with [facility] [place] [facility], and the patient was discharged in stable condition with analgesics, home care  instructions.        Gerhard Munch, MD 11/10/12 1213

## 2012-11-10 NOTE — ED Notes (Signed)
Pt states he has been having rectal pain since last Sunday evening. Pt states it hurts to walk and sit, pain is throbbing. "I can feel something in my left, lower, inside cheek, it hurts." Hx of previous boil located on left cheek.

## 2012-11-10 NOTE — ED Notes (Signed)
Pt states he took a suppository for pain last night, but states he cannot remember the name of the medication.

## 2013-01-20 ENCOUNTER — Emergency Department (HOSPITAL_BASED_OUTPATIENT_CLINIC_OR_DEPARTMENT_OTHER)
Admission: EM | Admit: 2013-01-20 | Discharge: 2013-01-20 | Disposition: A | Discharge status: Home / Self Care | Payer: Self-pay | Attending: Emergency Medicine | Admitting: Emergency Medicine

## 2013-01-20 ENCOUNTER — Encounter (HOSPITAL_BASED_OUTPATIENT_CLINIC_OR_DEPARTMENT_OTHER): Payer: Self-pay | Admitting: *Deleted

## 2013-01-20 DIAGNOSIS — IMO0002 Reserved for concepts with insufficient information to code with codable children: Secondary | ICD-10-CM | POA: Insufficient documentation

## 2013-01-20 DIAGNOSIS — M7989 Other specified soft tissue disorders: Secondary | ICD-10-CM | POA: Insufficient documentation

## 2013-01-20 DIAGNOSIS — L03113 Cellulitis of right upper limb: Secondary | ICD-10-CM

## 2013-01-20 DIAGNOSIS — J45909 Unspecified asthma, uncomplicated: Secondary | ICD-10-CM | POA: Insufficient documentation

## 2013-01-20 DIAGNOSIS — F17200 Nicotine dependence, unspecified, uncomplicated: Secondary | ICD-10-CM | POA: Insufficient documentation

## 2013-01-20 MED ORDER — HYDROCODONE-ACETAMINOPHEN 5-325 MG PO TABS: 2.0000 | ORAL_TABLET | ORAL | Status: DC | PRN

## 2013-01-20 MED ORDER — CEPHALEXIN 500 MG PO CAPS: 500.0000 mg | ORAL_CAPSULE | Freq: Four times a day (QID) | ORAL | Status: DC

## 2013-01-20 MED ORDER — SULFAMETHOXAZOLE-TRIMETHOPRIM 800-160 MG PO TABS: 1.0000 | ORAL_TABLET | Freq: Two times a day (BID) | ORAL | Status: DC

## 2013-01-20 NOTE — ED Provider Notes (Signed)
CSN: [id number]     Arrival date & time 01/20/13  1444 History     First MD Initiated Contact with Patient 01/20/13 1500     Chief Complaint  Patient presents with  . Abscess   (Consider location/radiation/quality/duration/timing/severity/associated sxs/prior Treatment) HPI Comments: Patient with a two day history of swelling and pain to the right forearm.  He denies any injury or trauma.  No fever or chills.  Patient is a 32 y.o. male presenting with abscess. The history is provided by the patient.  Abscess Abscess location: right forearm. Abscess quality: painful and redness   Red streaking: no   Duration:  2 days Progression:  Worsening Pain details:    Quality:  Throbbing   Severity:  Moderate   Timing:  Constant   Progression:  Worsening Chronicity:  New Context: not diabetes, not immunosuppression and not insect bite/sting   Relieved by:  Nothing Worsened by:  Nothing tried (movement, palpation) Ineffective treatments:  None tried   Past Medical History  Diagnosis Date  . Asthma   . Narcotic abuse    Past Surgical History  Procedure Laterality Date  . I&d extremity  03/24/2012    Procedure: IRRIGATION AND DEBRIDEMENT EXTREMITY;  Surgeon: Eldred Manges, MD;  Location: [facility] OR;  Service: Orthopedics;  Laterality: Right;  Right Forearm   No family history on file. History  Substance Use Topics  . Smoking status: Current Every Day Smoker -- 0.50 packs/day for 10 years    Types: Cigarettes  . Smokeless tobacco: Not on file  . Alcohol Use: Yes     Comment: occasional- last beer PTA    Review of Systems  All other systems reviewed and are negative.    Allergies  Review of patient's allergies indicates no known allergies.  Home Medications   Current Outpatient Rx  Name  Route  Sig  Dispense  Refill  . cephALEXin (KEFLEX) 500 MG capsule   Oral   Take 1 capsule (500 mg total) by mouth 4 (four) times daily.   40 capsule   1   . HYDROcodone-acetaminophen  (NORCO/VICODIN) 5-325 MG per tablet   Oral   Take 1-2 tablets by mouth every 6 (six) hours as needed for pain.   15 tablet   0   . ibuprofen (ADVIL,MOTRIN) 200 MG tablet   Oral   Take 800 mg by mouth every 6 (six) hours as needed. For pain         . senna-docusate (SENOKOT-S) 8.6-50 MG per tablet   Oral   Take 2 tablets by mouth daily.   30 tablet   0   . zolpidem (AMBIEN) 5 MG tablet   Oral   Take 1 tablet (5 mg total) by mouth at bedtime as needed for sleep.   3 tablet   0    BP 135/78  Pulse 97  Temp(Src) 98.2 F (36.8 C) (Oral)  Resp 20  Ht 5\' 10"  (1.778 m)  Wt 230 lb (104.327 kg)  BMI 33 kg/m2  SpO2 97% Physical Exam  Nursing note and vitals reviewed. Constitutional: He is oriented to person, place, and time. He appears well-developed and well-nourished. No distress.  HENT:  Head: Normocephalic and atraumatic.  Mouth/Throat: Oropharynx is clear and moist.  Neck: Normal range of motion. Neck supple.  Musculoskeletal: Normal range of motion.  There is an erythematous, swollen area to the dorsal aspect of the right distal forearm that measures about 2 cm by 3 cm.  There is no  definite fluctuance.  There is pain with palpation and range of motion.  Neurological: He is alert and oriented to person, place, and time.  Skin: Skin is warm and dry. He is not diaphoretic.    ED Course   Procedures (including critical care time)  Labs Reviewed - No data to display No results found. No diagnosis found.  MDM  This appears to be cellulitis in an area that he has been scratching.  Will treat with keflex and bactrim, warm soaks.  Return as needed if he worsens.  Geoffery Lyons, MD 01/20/13 [phone]

## 2013-01-20 NOTE — ED Notes (Addendum)
Abscess to his right wrist x 2 days. States he cut his arm on a rack at work. Denies IV drug use.

## 2013-06-06 ENCOUNTER — Emergency Department (HOSPITAL_BASED_OUTPATIENT_CLINIC_OR_DEPARTMENT_OTHER): Payer: Self-pay

## 2013-06-06 ENCOUNTER — Emergency Department (HOSPITAL_BASED_OUTPATIENT_CLINIC_OR_DEPARTMENT_OTHER)
Admission: EM | Admit: 2013-06-06 | Discharge: 2013-06-06 | Disposition: A | Discharge status: Home / Self Care | Payer: Self-pay | Attending: Emergency Medicine | Admitting: Emergency Medicine

## 2013-06-06 ENCOUNTER — Encounter (HOSPITAL_BASED_OUTPATIENT_CLINIC_OR_DEPARTMENT_OTHER): Payer: Self-pay | Admitting: Emergency Medicine

## 2013-06-06 DIAGNOSIS — Y9301 Activity, walking, marching and hiking: Secondary | ICD-10-CM | POA: Insufficient documentation

## 2013-06-06 DIAGNOSIS — Y929 Unspecified place or not applicable: Secondary | ICD-10-CM | POA: Insufficient documentation

## 2013-06-06 DIAGNOSIS — S62309A Unspecified fracture of unspecified metacarpal bone, initial encounter for closed fracture: Secondary | ICD-10-CM | POA: Insufficient documentation

## 2013-06-06 DIAGNOSIS — F17200 Nicotine dependence, unspecified, uncomplicated: Secondary | ICD-10-CM | POA: Insufficient documentation

## 2013-06-06 DIAGNOSIS — Z79899 Other long term (current) drug therapy: Secondary | ICD-10-CM | POA: Insufficient documentation

## 2013-06-06 DIAGNOSIS — J45909 Unspecified asthma, uncomplicated: Secondary | ICD-10-CM | POA: Insufficient documentation

## 2013-06-06 DIAGNOSIS — S6291XA Unspecified fracture of right wrist and hand, initial encounter for closed fracture: Secondary | ICD-10-CM

## 2013-06-06 DIAGNOSIS — Z792 Long term (current) use of antibiotics: Secondary | ICD-10-CM | POA: Insufficient documentation

## 2013-06-06 DIAGNOSIS — W2209XA Striking against other stationary object, initial encounter: Secondary | ICD-10-CM | POA: Insufficient documentation

## 2013-06-06 MED ORDER — OXYCODONE-ACETAMINOPHEN 5-325 MG PO TABS: 1.0000 | ORAL_TABLET | ORAL | Status: DC | PRN

## 2013-06-06 NOTE — ED Notes (Signed)
Fell walking dog yesterday-struck right hand on steps-swelling noted

## 2013-06-06 NOTE — ED Provider Notes (Signed)
CSN: [id number]     Arrival date & time 06/06/13  1812 History   First MD Initiated Contact with Patient 06/06/13 1859     Chief Complaint  Patient presents with  . Hand Injury   (Consider location/radiation/quality/duration/timing/severity/associated sxs/prior Treatment) Patient is a 32 y.o. male presenting with hand injury. The history is provided by the patient.  Hand Injury Location:  Hand Time since incident:  1 day Injury: yes   Mechanism of injury: fall   Fall:    Fall occurred:  Down stairs   Impact surface:  Primary school teacher of impact:  Hands   Entrapped after fall: no   Hand location:  R hand Pain details:    Quality:  Aching   Radiates to:  Does not radiate   Severity:  Severe   Onset quality:  Sudden   Timing:  Constant   Progression:  Unchanged Chronicity:  New Handedness:  Right-handed Dislocation: no   Foreign body present:  No foreign bodies Prior injury to area:  No Relieved by:  Nothing Ineffective treatments:  None tried  Joseph [name] is a 32 y.o. male who presents to the ED with right hand pain after she hit her hand on the steps when he fell while walking his dog yesterday. He complains of pain and swelling.   Past Medical History  Diagnosis Date  . Asthma   . Narcotic abuse    Past Surgical History  Procedure Laterality Date  . I&d extremity  03/24/2012    Procedure: IRRIGATION AND DEBRIDEMENT EXTREMITY;  Surgeon: Eldred Manges, MD;  Location: [facility] OR;  Service: Orthopedics;  Laterality: Right;  Right Forearm   No family history on file. History  Substance Use Topics  . Smoking status: Current Every Day Smoker -- 0.50 packs/day for 10 years    Types: Cigarettes  . Smokeless tobacco: Not on file  . Alcohol Use: Yes    Review of Systems Negative except as stated in HPI  Allergies  Review of patient's allergies indicates no known allergies.  Home Medications   Current Outpatient Rx  Name  Route  Sig  Dispense  Refill  . cephALEXin  (KEFLEX) 500 MG capsule   Oral   Take 1 capsule (500 mg total) by mouth 4 (four) times daily.   40 capsule   1   . cephALEXin (KEFLEX) 500 MG capsule   Oral   Take 1 capsule (500 mg total) by mouth 4 (four) times daily.   28 capsule   0   . HYDROcodone-acetaminophen (NORCO) 5-325 MG per tablet   Oral   Take 2 tablets by mouth every 4 (four) hours as needed for pain.   10 tablet   0   . HYDROcodone-acetaminophen (NORCO/VICODIN) 5-325 MG per tablet   Oral   Take 1-2 tablets by mouth every 6 (six) hours as needed for pain.   15 tablet   0   . ibuprofen (ADVIL,MOTRIN) 200 MG tablet   Oral   Take 800 mg by mouth every 6 (six) hours as needed. For pain         . senna-docusate (SENOKOT-S) 8.6-50 MG per tablet   Oral   Take 2 tablets by mouth daily.   30 tablet   0   . sulfamethoxazole-trimethoprim (BACTRIM DS,SEPTRA DS) 800-160 MG per tablet   Oral   Take 1 tablet by mouth 2 (two) times daily.   14 tablet   0   . zolpidem (AMBIEN) 5 MG  tablet   Oral   Take 1 tablet (5 mg total) by mouth at bedtime as needed for sleep.   3 tablet   0    BP 136/88  Pulse 74  Temp(Src) 98.1 F (36.7 C) (Oral)  Resp 16  Ht 5\' 10"  (1.778 m)  Wt 217 lb (98.431 kg)  BMI 31.14 kg/m2  SpO2 100% Physical Exam  Nursing note and vitals reviewed. Constitutional: He is oriented to person, place, and time. He appears well-developed and well-nourished. No distress.  Eyes: EOM are normal.  Neck: Neck supple.  Cardiovascular: Normal rate.   Pulmonary/Chest: Effort normal.  Musculoskeletal:       Right hand: He exhibits tenderness, bony tenderness and swelling. He exhibits normal range of motion, normal capillary refill, no deformity and no laceration. Normal sensation noted. Normal strength noted.       Hands: Radial pulse strong. Adequate circulation, good touch sensation.  Neurological: He is alert and oriented to person, place, and time. He has normal strength. No cranial nerve deficit  or sensory deficit.  Skin: Skin is warm and dry.  Psychiatric: He has a normal mood and affect. His behavior is normal.    ED Course  Procedures (including critical care time) Labs Review Labs Reviewed - No data to display Imaging Review Dg Hand Complete Right  06/06/2013   CLINICAL DATA:  Pain post trauma  EXAM: RIGHT HAND - COMPLETE 3+ VIEW  COMPARISON:  None.  FINDINGS: Frontal, oblique, and lateral views were obtained. There is a comminuted fracture of the proximal 5th metacarpal with several displaced fracture fragments. No other fracture. No dislocation. Joint spaces appear intact. No erosive change.  IMPRESSION: Comminuted fracture proximal 5th metacarpal. Several fracture fragments are displaced in this area.   Electronically Signed   By: Bretta Bang M.D.   On: 06/06/2013 18:35    EKG Interpretation   None       32 y.o. male with Comminuted fracture of the proximal 5th MC right hand. Placed in ulnar gutter splint, pain management, ice, elevation. Patient to follow up with Hand Surgeon. He will call for appointment. Stable for discharge, neurovascularly intact.  Discussed with the patient and all questioned fully answered. He will return if any problems arise.    Medication List    TAKE these medications       oxyCODONE-acetaminophen 5-325 MG per tablet  Commonly known as:  ROXICET  Take 1 tablet by mouth every 4 (four) hours as needed for severe pain.      ASK your doctor about these medications       cephALEXin 500 MG capsule  Commonly known as:  KEFLEX  Take 1 capsule (500 mg total) by mouth 4 (four) times daily.     cephALEXin 500 MG capsule  Commonly known as:  KEFLEX  Take 1 capsule (500 mg total) by mouth 4 (four) times daily.     HYDROcodone-acetaminophen 5-325 MG per tablet  Commonly known as:  NORCO/VICODIN  Take 1-2 tablets by mouth every 6 (six) hours as needed for pain.     HYDROcodone-acetaminophen 5-325 MG per tablet  Commonly known as:   NORCO  Take 2 tablets by mouth every 4 (four) hours as needed for pain.     ibuprofen 200 MG tablet  Commonly known as:  ADVIL,MOTRIN  Take 800 mg by mouth every 6 (six) hours as needed. For pain     senna-docusate 8.6-50 MG per tablet  Commonly known as:  Senokot-S  Take 2 tablets by mouth daily.     sulfamethoxazole-trimethoprim 800-160 MG per tablet  Commonly known as:  BACTRIM DS,SEPTRA DS  Take 1 tablet by mouth 2 (two) times daily.     zolpidem 5 MG tablet  Commonly known as:  AMBIEN  Take 1 tablet (5 mg total) by mouth at bedtime as needed for sleep.           Janne Napoleon, NP 06/06/13 2005

## 2013-06-06 NOTE — ED Notes (Signed)
EMT at [facility] splinting hand, pt alert, NAD, calm, interactive, d/c instructions explained.

## 2013-06-08 NOTE — ED Provider Notes (Signed)
Medical screening examination/treatment/procedure(s) were performed by non-physician practitioner and as supervising physician I was immediately available for consultation/collaboration.  EKG Interpretation   None        Doug Sou, MD 06/08/13 [phone]

## 2013-06-14 ENCOUNTER — Encounter (HOSPITAL_COMMUNITY): Payer: Self-pay | Admitting: Pharmacist

## 2013-06-20 ENCOUNTER — Encounter (HOSPITAL_COMMUNITY): Payer: Self-pay

## 2013-06-20 ENCOUNTER — Encounter (HOSPITAL_COMMUNITY)
Admission: RE | Admit: 2013-06-20 | Discharge: 2013-06-20 | Disposition: A | Discharge status: Home / Self Care | Payer: Self-pay | Source: Ambulatory Visit | Attending: Orthopedic Surgery | Admitting: Orthopedic Surgery

## 2013-06-20 ENCOUNTER — Other Ambulatory Visit (HOSPITAL_COMMUNITY): Payer: Self-pay | Admitting: *Deleted

## 2013-06-20 DIAGNOSIS — Z01812 Encounter for preprocedural laboratory examination: Secondary | ICD-10-CM | POA: Insufficient documentation

## 2013-06-20 DIAGNOSIS — Z01818 Encounter for other preprocedural examination: Secondary | ICD-10-CM | POA: Insufficient documentation

## 2013-06-20 LAB — COMPREHENSIVE METABOLIC PANEL
Alkaline Phosphatase: 65 U/L (ref 39–117)
BUN: 14 mg/dL (ref 6–23)
Calcium: 9.1 mg/dL (ref 8.4–10.5)
GFR calc Af Amer: >90 mL/min (ref >90)
GFR calc non Af Amer: >90 mL/min (ref >90)
Glucose, Bld: 101 mg/dL — ABNORMAL HIGH (ref 70–99)
Potassium: 4.5 mEq/L (ref 3.5–5.1)
Total Protein: 7.1 g/dL (ref 6.0–8.3)

## 2013-06-20 LAB — CBC
HCT: 44.5 % (ref 39.0–52.0)
Hemoglobin: 15.2 g/dL (ref 13.0–17.0)
MCH: 31 pg (ref 26.0–34.0)
MCHC: 34.2 g/dL (ref 30.0–36.0)

## 2013-06-20 NOTE — Pre-Procedure Instructions (Signed)
Joseph [name]  06/20/2013   Your procedure is scheduled on:  Wednesday, June 22, 2013 (Please call [phone] at 8 AM to find out time of surgery).   Report to [facility] Entrance "A" Admitting office 2 hours prior to scheduled surgery time.    Call this number if you have problems the morning of surgery: [phone]   Remember:   Do not eat food or drink liquids after midnight Tuesday, 06/21/13.   Take these medicines the morning of surgery with A SIP OF WATER: Percocet - if needed.   Do not wear jewelry.  Do not wear lotions, powders, or cologne. You may wear deodorant.  Men may shave face and neck.  Do not bring valuables to the hospital.  [facility] is not responsible                  for any belongings or valuables.               Contacts, dentures or bridgework may not be worn into surgery.  Leave suitcase in the car. After surgery it may be brought to your room.  For patients admitted to the hospital, discharge time is determined by your                treatment team.               Patients discharged the day of surgery will not be allowed to drive  home.  Name and phone number of your driver: Family/friend   Special Instructions: Shower using CHG 2 nights before surgery and the night before surgery.  If you shower the day of surgery use CHG.  Use special wash - you have one bottle of CHG for all showers.  You should use approximately 1/3 of the bottle for each shower.   Please read over the following fact sheets that you were given: Pain Booklet, Coughing and Deep Breathing and Surgical Site Infection Prevention

## 2013-06-20 NOTE — Progress Notes (Signed)
Called Dr. Glenna Durand office for pre-op orders.

## 2013-06-22 ENCOUNTER — Ambulatory Visit (HOSPITAL_COMMUNITY)
Admission: RE | Admit: 2013-06-22 | Discharge: 2013-06-22 | Disposition: A | Discharge status: Home / Self Care | Payer: Self-pay | Source: Ambulatory Visit | Attending: Orthopedic Surgery | Admitting: Orthopedic Surgery

## 2013-06-22 ENCOUNTER — Encounter (HOSPITAL_COMMUNITY): Payer: Self-pay | Admitting: *Deleted

## 2013-06-22 ENCOUNTER — Encounter (HOSPITAL_COMMUNITY): Payer: Self-pay | Admitting: Anesthesiology

## 2013-06-22 ENCOUNTER — Ambulatory Visit (HOSPITAL_COMMUNITY): Payer: Self-pay | Admitting: Anesthesiology

## 2013-06-22 ENCOUNTER — Encounter (HOSPITAL_COMMUNITY)
Admission: RE | Disposition: A | Discharge status: Home / Self Care | Payer: Self-pay | Source: Ambulatory Visit | Attending: Orthopedic Surgery

## 2013-06-22 DIAGNOSIS — F17200 Nicotine dependence, unspecified, uncomplicated: Secondary | ICD-10-CM | POA: Insufficient documentation

## 2013-06-22 DIAGNOSIS — W108XXA Fall (on) (from) other stairs and steps, initial encounter: Secondary | ICD-10-CM | POA: Insufficient documentation

## 2013-06-22 DIAGNOSIS — Y93K1 Activity, walking an animal: Secondary | ICD-10-CM | POA: Insufficient documentation

## 2013-06-22 DIAGNOSIS — S62319A Displaced fracture of base of unspecified metacarpal bone, initial encounter for closed fracture: Secondary | ICD-10-CM | POA: Insufficient documentation

## 2013-06-22 HISTORY — PX: CLOSED REDUCTION FINGER WITH PERCUTANEOUS PINNING: SHX5612

## 2013-06-22 SURGERY — CLOSED REDUCTION, FINGER, WITH PERCUTANEOUS PINNING
Anesthesia: General | Site: Finger | Laterality: Right

## 2013-06-22 MED ORDER — DOCUSATE SODIUM 100 MG PO CAPS: 100.0000 mg | ORAL_CAPSULE | Freq: Two times a day (BID) | ORAL | Status: DC

## 2013-06-22 MED ORDER — CEFAZOLIN SODIUM-DEXTROSE 2-3 GM-% IV SOLR
2.0000 g | INTRAVENOUS | Status: AC
  Administered 2013-06-22: 2 g via INTRAVENOUS

## 2013-06-22 MED ORDER — LACTATED RINGERS IV SOLN
INTRAVENOUS | Status: DC
  Administered 2013-06-22: 14:00:00 via INTRAVENOUS

## 2013-06-22 MED ORDER — CEFAZOLIN SODIUM-DEXTROSE 2-3 GM-% IV SOLR
INTRAVENOUS | Status: AC
  Filled 2013-06-22: qty 50

## 2013-06-22 MED ORDER — OXYCODONE HCL 5 MG PO TABS
ORAL_TABLET | ORAL | Status: AC
  Administered 2013-06-22: 10 mg via ORAL
  Filled 2013-06-22: qty 2

## 2013-06-22 MED ORDER — FENTANYL CITRATE 0.05 MG/ML IJ SOLN
INTRAMUSCULAR | Status: DC | PRN
  Administered 2013-06-22 (×2): 50 ug via INTRAVENOUS

## 2013-06-22 MED ORDER — LIDOCAINE HCL (CARDIAC) 20 MG/ML IV SOLN
INTRAVENOUS | Status: DC | PRN
  Administered 2013-06-22: 100 mg via INTRAVENOUS

## 2013-06-22 MED ORDER — ONDANSETRON HCL 4 MG/2ML IJ SOLN: 4.0000 mg | Freq: Four times a day (QID) | INTRAMUSCULAR | Status: DC | PRN

## 2013-06-22 MED ORDER — BUPIVACAINE HCL (PF) 0.25 % IJ SOLN
INTRAMUSCULAR | Status: AC
  Filled 2013-06-22: qty 30

## 2013-06-22 MED ORDER — OXYCODONE HCL 5 MG/5ML PO SOLN: 5.0000 mg | Freq: Once | ORAL | Status: AC | PRN

## 2013-06-22 MED ORDER — HYDROMORPHONE HCL PF 1 MG/ML IJ SOLN
0.2500 mg | INTRAMUSCULAR | Status: DC | PRN
  Administered 2013-06-22 (×3): 0.5 mg via INTRAVENOUS

## 2013-06-22 MED ORDER — CHLORHEXIDINE GLUCONATE 4 % EX LIQD: 60.0000 mL | Freq: Once | CUTANEOUS | Status: DC

## 2013-06-22 MED ORDER — PROPOFOL 10 MG/ML IV BOLUS
INTRAVENOUS | Status: DC | PRN
  Administered 2013-06-22: 200 mg via INTRAVENOUS
  Administered 2013-06-22: 40 mg via INTRAVENOUS
  Administered 2013-06-22: 60 mg via INTRAVENOUS

## 2013-06-22 MED ORDER — OXYCODONE HCL 5 MG PO TABS
5.0000 mg | ORAL_TABLET | Freq: Once | ORAL | Status: AC | PRN
  Administered 2013-06-22: 10 mg via ORAL

## 2013-06-22 MED ORDER — BUPIVACAINE HCL (PF) 0.25 % IJ SOLN
INTRAMUSCULAR | Status: DC | PRN
  Administered 2013-06-22: 30 mL

## 2013-06-22 MED ORDER — ONDANSETRON HCL 4 MG/2ML IJ SOLN
INTRAMUSCULAR | Status: DC | PRN
  Administered 2013-06-22: 4 mg via INTRAVENOUS

## 2013-06-22 MED ORDER — MIDAZOLAM HCL 5 MG/5ML IJ SOLN
INTRAMUSCULAR | Status: DC | PRN
  Administered 2013-06-22: 2 mg via INTRAVENOUS

## 2013-06-22 MED ORDER — 0.9 % SODIUM CHLORIDE (POUR BTL) OPTIME
TOPICAL | Status: DC | PRN
  Administered 2013-06-22: 1000 mL

## 2013-06-22 MED ORDER — HYDROMORPHONE HCL PF 1 MG/ML IJ SOLN
INTRAMUSCULAR | Status: AC
  Administered 2013-06-22: 0.5 mg via INTRAVENOUS
  Filled 2013-06-22: qty 2

## 2013-06-22 MED ORDER — OXYCODONE-ACETAMINOPHEN 10-325 MG PO TABS: 1.0000 | ORAL_TABLET | ORAL | Status: DC | PRN

## 2013-06-22 SURGICAL SUPPLY — 59 items
BANDAGE ELASTIC 3 VELCRO ST LF (GAUZE/BANDAGES/DRESSINGS) ×2 IMPLANT
BANDAGE ELASTIC 4 VELCRO ST LF (GAUZE/BANDAGES/DRESSINGS) ×2 IMPLANT
BANDAGE GAUZE ELAST BULKY 4 IN (GAUZE/BANDAGES/DRESSINGS) IMPLANT
BENZOIN TINCTURE PRP APPL 2/3 (GAUZE/BANDAGES/DRESSINGS) IMPLANT
BLADE SURG ROTATE 9660 (MISCELLANEOUS) IMPLANT
BNDG ESMARK 4X9 LF (GAUZE/BANDAGES/DRESSINGS) IMPLANT
CLOTH BEACON ORANGE TIMEOUT ST (SAFETY) ×2 IMPLANT
CORDS BIPOLAR (ELECTRODE) ×2 IMPLANT
COVER SURGICAL LIGHT HANDLE (MISCELLANEOUS) ×2 IMPLANT
CUFF TOURNIQUET SINGLE 18IN (TOURNIQUET CUFF) ×2 IMPLANT
CUFF TOURNIQUET SINGLE 24IN (TOURNIQUET CUFF) IMPLANT
DRAIN TLS ROUND 10FR (DRAIN) IMPLANT
DRAPE OEC MINIVIEW 54X84 (DRAPES) ×2 IMPLANT
DRAPE SURG 17X11 SM STRL (DRAPES) ×2 IMPLANT
DRSG ADAPTIC 3X8 NADH LF (GAUZE/BANDAGES/DRESSINGS) IMPLANT
DRSG EMULSION OIL 3X3 NADH (GAUZE/BANDAGES/DRESSINGS) IMPLANT
ELECT REM PT RETURN 9FT ADLT (ELECTROSURGICAL)
ELECTRODE REM PT RTRN 9FT ADLT (ELECTROSURGICAL) IMPLANT
GAUZE SPONGE 4X4 16PLY XRAY LF (GAUZE/BANDAGES/DRESSINGS) IMPLANT
GAUZE XEROFORM 1X8 LF (GAUZE/BANDAGES/DRESSINGS) ×2 IMPLANT
GLOVE BIOGEL PI IND STRL 8.5 (GLOVE) ×1 IMPLANT
GLOVE BIOGEL PI INDICATOR 8.5 (GLOVE) ×1
GLOVE SURG ORTHO 8.0 STRL STRW (GLOVE) ×2 IMPLANT
GOWN PREVENTION PLUS XLARGE (GOWN DISPOSABLE) ×2 IMPLANT
GOWN STRL NON-REIN LRG LVL3 (GOWN DISPOSABLE) ×6 IMPLANT
K-WIRE .045 CH (WIRE) ×6
KIT BASIN OR (CUSTOM PROCEDURE TRAY) ×2 IMPLANT
KIT ROOM TURNOVER OR (KITS) ×2 IMPLANT
KWIRE .045 CH (WIRE) ×3 IMPLANT
MANIFOLD NEPTUNE II (INSTRUMENTS) IMPLANT
NEEDLE HYPO 25X1 1.5 SAFETY (NEEDLE) ×2 IMPLANT
NS IRRIG 1000ML POUR BTL (IV SOLUTION) ×2 IMPLANT
PACK ORTHO EXTREMITY (CUSTOM PROCEDURE TRAY) ×2 IMPLANT
PAD ARMBOARD 7.5X6 YLW CONV (MISCELLANEOUS) ×4 IMPLANT
PAD CAST 3X4 CTTN HI CHSV (CAST SUPPLIES) ×1 IMPLANT
PAD CAST 4YDX4 CTTN HI CHSV (CAST SUPPLIES) ×1 IMPLANT
PADDING CAST COTTON 3X4 STRL (CAST SUPPLIES) ×1
PADDING CAST COTTON 4X4 STRL (CAST SUPPLIES) ×1
SOAP 2 % CHG 4 OZ (WOUND CARE) ×2 IMPLANT
SPLINT FIBERGLASS 4X30 (CAST SUPPLIES) ×2 IMPLANT
SPONGE GAUZE 4X4 12PLY (GAUZE/BANDAGES/DRESSINGS) ×2 IMPLANT
SPONGE GAUZE 4X4 12PLY STER LF (GAUZE/BANDAGES/DRESSINGS) ×2 IMPLANT
STRIP CLOSURE SKIN 1/2X4 (GAUZE/BANDAGES/DRESSINGS) IMPLANT
SUCTION FRAZIER TIP 10 FR DISP (SUCTIONS) IMPLANT
SUT ETHILON 4 0 P 3 18 (SUTURE) IMPLANT
SUT ETHILON 4 0 PS 2 18 (SUTURE) IMPLANT
SUT ETHILON 5 0 P 3 18 (SUTURE)
SUT MNCRL AB 4-0 PS2 18 (SUTURE) IMPLANT
SUT NYLON ETHILON 5-0 P-3 1X18 (SUTURE) IMPLANT
SUT PROLENE 4 0 P 3 18 (SUTURE) IMPLANT
SUT VIC AB 2-0 FS1 27 (SUTURE) IMPLANT
SUT VICRYL 4-0 PS2 18IN ABS (SUTURE) IMPLANT
SYR CONTROL 10ML LL (SYRINGE) IMPLANT
SYSTEM CHEST DRAIN TLS 7FR (DRAIN) IMPLANT
TOWEL OR 17X24 6PK STRL BLUE (TOWEL DISPOSABLE) ×2 IMPLANT
TOWEL OR 17X26 10 PK STRL BLUE (TOWEL DISPOSABLE) ×2 IMPLANT
TUBE CONNECTING 12X1/4 (SUCTIONS) IMPLANT
WATER STERILE IRR 1000ML POUR (IV SOLUTION) ×2 IMPLANT
YANKAUER SUCT BULB TIP NO VENT (SUCTIONS) IMPLANT

## 2013-06-22 NOTE — Brief Op Note (Signed)
06/22/2013  1:33 PM  PATIENT:  Joseph [name]  32 y.o. male  PRE-OPERATIVE DIAGNOSIS:  RIGHT HAND METACARPAL BASE FRACTURE OF SMALL FINGER    POST-OPERATIVE DIAGNOSIS:  SAME  PROCEDURE:  Right small finger CRPP  SURGEON:  Surgeon(s) and Role:    * Sharma Covert, MD - Primary  PHYSICIAN ASSISTANT: none  ASSISTANTS: none   ANESTHESIA:   general  EBL:   minimal  BLOOD ADMINISTERED:none  DRAINS: none   LOCAL MEDICATIONS USED:  MARCAINE     SPECIMEN:  No Specimen  DISPOSITION OF SPECIMEN:  N/A  COUNTS:  YES  TOURNIQUET:  None  DICTATION: .[id number]  PLAN OF CARE: Discharge to home after PACU  PATIENT DISPOSITION:  PACU - hemodynamically stable.   Delay start of Pharmacological VTE agent (>24hrs) due to surgical blood loss or risk of bleeding: not applicable

## 2013-06-22 NOTE — H&P (Signed)
Joseph [name] is an 32 y.o. male.   Chief Complaint: RIGHT HAND SMALL FINGER INJURY HPI: PT SUSTAINED CLOSED INJURY TO RIGHT HAND PT SEEN/EVALUATED IN OFFICE HERE FOR SURGERY TO CORRECT INJURY TO RIGHT SMALL FINGER NO PRIOR INJURY TO RIGHT SMALL FINGER  Past Medical History  Diagnosis Date  . Narcotic abuse   . Asthma     as a child    Past Surgical History  Procedure Laterality Date  . I&d extremity  03/24/2012    Procedure: IRRIGATION AND DEBRIDEMENT EXTREMITY;  Surgeon: Eldred Manges, MD;  Location: [facility] OR;  Service: Orthopedics;  Laterality: Right;  Right Forearm    No family history on file. Social History:  reports that he has been smoking Cigarettes.  He has a 5 pack-year smoking history. He has never used smokeless tobacco. He reports that he drinks alcohol. He reports that he uses illicit drugs (Marijuana).  Allergies: No Known Allergies  No prescriptions prior to admission    No results found for this or any previous visit (from the past 48 hour(s)). No results found.  ROS NO RECENT ILLNESSES OR HOSPITALIZATIONS  There were no vitals taken for this visit. Physical Exam  General Appearance:  Alert, cooperative, no distress, appears stated age  Head:  Normocephalic, without obvious abnormality, atraumatic  Eyes:  Pupils equal, conjunctiva/corneas clear,         Throat: Lips, mucosa, and tongue normal; teeth and gums normal  Neck: No visible masses     Lungs:   respirations unlabored  Chest Wall:  No tenderness or deformity  Heart:  Regular rate and rhythm,  Abdomen:   Soft, non-tender,         Extremities: RIGHT HAND: SPLINT IN GOOD CONDITION FINGERS WARM WELL PERFUSED GOOD CAP REFIL WIGGLES FINGERS  Pulses: 2+ and symmetric  Skin: Skin color, texture, turgor normal, no rashes or lesions     Neurologic: Normal    Assessment/Plan RIGHT SMALL FINGER METACARPAL BASE FRACTURE, INTRAARTICULAR  RIGHT SMALL FINGER OPEN REDUCTION AND INTERNAL FIXATION,  POSSIBLE CLOSED REDUCTION AND PINING  R/B/A DISCUSSED WITH PT IN OFFICE.  PT VOICED UNDERSTANDING OF PLAN CONSENT SIGNED DAY OF SURGERY PT SEEN AND EXAMINED PRIOR TO OPERATIVE PROCEDURE/DAY OF SURGERY SITE MARKED. QUESTIONS ANSWERED WILL GO HOME FOLLOWING SURGERY  Sharma Covert 06/22/2013, 1:30 PM

## 2013-06-22 NOTE — Preoperative (Signed)
Beta Blockers   Reason not to administer Beta Blockers:Not Applicable 

## 2013-06-22 NOTE — Anesthesia Preprocedure Evaluation (Signed)
Anesthesia Evaluation  Patient identified by MRN, date of birth, ID band Patient awake    Reviewed: Allergy & Precautions, H&P , NPO status , Patient's Chart, lab work & pertinent test results  Airway Mallampati: II  Neck ROM: full    Dental   Pulmonary Current Smoker,          Cardiovascular     Neuro/Psych    GI/Hepatic (+)     substance abuse   ,   Endo/Other  obese  Renal/GU      Musculoskeletal   Abdominal   Peds  Hematology   Anesthesia Other Findings   Reproductive/Obstetrics                           Anesthesia Physical Anesthesia Plan  ASA: II  Anesthesia Plan: General   Post-op Pain Management:    Induction: Intravenous  Airway Management Planned: LMA  Additional Equipment:   Intra-op Plan:   Post-operative Plan:   Informed Consent: I have reviewed the patients History and Physical, chart, labs and discussed the procedure including the risks, benefits and alternatives for the proposed anesthesia with the patient or authorized representative who has indicated his/her understanding and acceptance.     Plan Discussed with: CRNA, Anesthesiologist and Surgeon  Anesthesia Plan Comments:         Anesthesia Quick Evaluation

## 2013-06-22 NOTE — Transfer of Care (Signed)
Immediate Anesthesia Transfer of Care Note  Patient: Joseph [name]  Procedure(s) Performed: Procedure(s): RIGHT HAND SMALL FINGER CLOSED MANIPULATION AND PINNING (Right)  Patient Location: PACU  Anesthesia Type:General  Level of Consciousness: awake, alert  and oriented  Airway & Oxygen Therapy: Patient Spontanous Breathing and Patient connected to nasal cannula oxygen  Post-op Assessment: Report given to PACU RN, Post -op Vital signs reviewed and stable and Patient moving all extremities  Post vital signs: Reviewed and stable  Complications: No apparent anesthesia complications

## 2013-06-22 NOTE — Anesthesia Procedure Notes (Signed)
Procedure Name: LMA Insertion Date/Time: 06/22/2013 4:56 PM Performed by: Charm Barges, Lolamae Voisin R Pre-anesthesia Checklist: Patient identified, Emergency Drugs available, Suction available, Patient being monitored and Timeout performed Patient Re-evaluated:Patient Re-evaluated prior to inductionOxygen Delivery Method: Circle system utilized Preoxygenation: Pre-oxygenation with 100% oxygen Intubation Type: IV induction Ventilation: Mask ventilation without difficulty LMA: LMA inserted LMA Size: 5.0 Number of attempts: 1 Placement Confirmation: positive ETCO2 and breath sounds checked- equal and bilateral Tube secured with: Tape Dental Injury: Teeth and Oropharynx as per pre-operative assessment

## 2013-06-22 NOTE — Anesthesia Postprocedure Evaluation (Signed)
Anesthesia Post Note  Patient: Joseph [name]  Procedure(s) Performed: Procedure(s) (LRB): RIGHT HAND SMALL FINGER CLOSED MANIPULATION AND PINNING (Right)  Anesthesia type: general  Patient location: PACU  Post pain: Pain level controlled  Post assessment: Patient's Cardiovascular Status Stable  Last Vitals:  Filed Vitals:   06/22/13 1800  BP:   Pulse: 79  Temp:   Resp: 18    Post vital signs: Reviewed and stable  Level of consciousness: sedated  Complications: No apparent anesthesia complications

## 2013-06-23 NOTE — Op Note (Signed)
NAME:  TENNYSON, WACHA NO.:  [id number]  MEDICAL RECORD NO.:  [id number]  LOCATION:  [facility]                         FACILITY:  [facility]  PHYSICIAN:  Madelynn Done, MD  DATE OF BIRTH:  [birth year]  DATE OF PROCEDURE:  06/22/2013 DATE OF DISCHARGE:  06/22/2013                              OPERATIVE REPORT   PREOPERATIVE DIAGNOSIS:  Right small finger carpometacarpal fracture dislocation, carpometacarpal joint.  POSTOPERATIVE DIAGNOSIS:  Right small finger carpometacarpal fracture dislocation, carpometacarpal joint.  ATTENDING PHYSICIAN:  Madelynn Done, MD, who scrubbed and present for the entire procedure.  ASSISTANT SURGEON:  None.  ANESTHESIA:  General via LMA.  SURGICAL PROCEDURE: 1. Percutaneous skeletal fixation of unstable carpometacarpal     dislocation requiring internal fixation. 2. 2.  Radiographs, 3 views, right hand.  SURGICAL IMPLANTS:  Three 0.045 K-wires.  SURGICAL INDICATIONS:  Mr. Vallone is a right-hand-dominant gentleman who sustained an injury after falling on his right hand to the fifth [facility] joint.  The patient was seen and evaluated in the office and given the degree of his injury, recommended to undergo the above procedure. Risks, benefits, and alternatives were discussed in detail with the patient and signed informed consent was obtained.  Risks include, but not limited to, bleeding; infection; damage to nearby nerves, arteries, or tendons; loss of motion of wrist and digits; incomplete relief of symptoms; and need for further surgical intervention.  DESCRIPTION OF PROCEDURE:  The patient was properly identified in the preoperative holding area and marked with a permanent marker made on the right hand to indicate the correct operative site.  The patient was then brought back to the operating room, placed supine on anesthesia room table.  General anesthesia was administered.  The patient tolerated this well.  A well-padded  tourniquet was then placed on the right brachium, sealed with 1000 drape.  The right upper extremity was then prepped and draped in normal sterile fashion.  A time-out was called, correct site was identified, and procedure then begun.  Attention then turned to the right hand where closed manipulation was then performed.  Longitudinal traction was made to the small finger and following this, two 0.045 K- wires were then placed from the small finger into the ring finger keeping it in a reduced and out to length.  The distraction pinning was then completed.  Following this, a 0.045 K-wire was then placed in the small finger metacarpal across the [facility] joint into the hamate for definitive stable construct.  K-wires were then cut and then left out of the skin.  Final radiographs were obtained, 3 views of the hand. Xeroform bolster dressing was then applied around the pin sites.  A 10 mL of 0.25% Marcaine was infiltrated locally.  The patient was placed in a well-padded sterile compressive bandage.  He was then placed in a short-arm splint, extubated, and taken to recovery room in good condition.  Radiographs, 3 views, of the hand did show the internal fixation in place.  There was good position in both planes.  POSTPROCEDURE PLAN:  The patient is discharged to home to be seen back in the office in approximately 12 days for wound check, x-rays  in the splint.  Continue with the current splint for a total of 4 weeks.  Pins out at the 4-week mark and then begin transition to short-arm brace at the 4-week mark.  So, see me at that 2-week mark and 4-week mark.     Madelynn DoneFred W Menucha Dicesare IV, MD     FWO/MEDQ  D:  06/22/2013  T:  06/23/2013  Job:  [id number]

## 2013-06-27 ENCOUNTER — Encounter (HOSPITAL_COMMUNITY): Payer: Self-pay | Admitting: Orthopedic Surgery

## 2014-12-02 ENCOUNTER — Encounter (HOSPITAL_COMMUNITY): Payer: Self-pay | Admitting: Emergency Medicine

## 2014-12-02 ENCOUNTER — Emergency Department (HOSPITAL_COMMUNITY)
Admission: EM | Admit: 2014-12-02 | Discharge: 2014-12-02 | Disposition: A | Discharge status: Other Acute Inpatient Hospital | Payer: Self-pay | Attending: Emergency Medicine | Admitting: Emergency Medicine

## 2014-12-02 ENCOUNTER — Encounter (HOSPITAL_COMMUNITY): Payer: Self-pay | Admitting: *Deleted

## 2014-12-02 ENCOUNTER — Inpatient Hospital Stay (HOSPITAL_COMMUNITY)
Admission: AD | Admit: 2014-12-02 | Discharge: 2014-12-06 | DRG: 897 | Disposition: A | Discharge status: Home / Self Care | Payer: Federal, State, Local not specified - Other | Source: Intra-hospital | Attending: Psychiatry | Admitting: Psychiatry

## 2014-12-02 DIAGNOSIS — F1994 Other psychoactive substance use, unspecified with psychoactive substance-induced mood disorder: Secondary | ICD-10-CM | POA: Diagnosis present

## 2014-12-02 DIAGNOSIS — F411 Generalized anxiety disorder: Secondary | ICD-10-CM | POA: Diagnosis present

## 2014-12-02 DIAGNOSIS — R45851 Suicidal ideations: Secondary | ICD-10-CM | POA: Diagnosis not present

## 2014-12-02 DIAGNOSIS — J45909 Unspecified asthma, uncomplicated: Secondary | ICD-10-CM | POA: Insufficient documentation

## 2014-12-02 DIAGNOSIS — F1420 Cocaine dependence, uncomplicated: Secondary | ICD-10-CM | POA: Diagnosis not present

## 2014-12-02 DIAGNOSIS — F329 Major depressive disorder, single episode, unspecified: Secondary | ICD-10-CM | POA: Diagnosis present

## 2014-12-02 DIAGNOSIS — F1220 Cannabis dependence, uncomplicated: Secondary | ICD-10-CM | POA: Diagnosis not present

## 2014-12-02 DIAGNOSIS — F3289 Other specified depressive episodes: Secondary | ICD-10-CM

## 2014-12-02 DIAGNOSIS — Y904 Blood alcohol level of 80-99 mg/100 ml: Secondary | ICD-10-CM | POA: Diagnosis present

## 2014-12-02 DIAGNOSIS — Z79899 Other long term (current) drug therapy: Secondary | ICD-10-CM | POA: Insufficient documentation

## 2014-12-02 DIAGNOSIS — F17210 Nicotine dependence, cigarettes, uncomplicated: Secondary | ICD-10-CM | POA: Diagnosis present

## 2014-12-02 DIAGNOSIS — F1490 Cocaine use, unspecified, uncomplicated: Secondary | ICD-10-CM | POA: Insufficient documentation

## 2014-12-02 DIAGNOSIS — F1094 Alcohol use, unspecified with alcohol-induced mood disorder: Secondary | ICD-10-CM | POA: Diagnosis not present

## 2014-12-02 DIAGNOSIS — R45850 Homicidal ideations: Secondary | ICD-10-CM | POA: Diagnosis present

## 2014-12-02 DIAGNOSIS — F1024 Alcohol dependence with alcohol-induced mood disorder: Principal | ICD-10-CM | POA: Diagnosis present

## 2014-12-02 DIAGNOSIS — Z720 Tobacco use: Secondary | ICD-10-CM | POA: Insufficient documentation

## 2014-12-02 DIAGNOSIS — F1020 Alcohol dependence, uncomplicated: Secondary | ICD-10-CM | POA: Diagnosis present

## 2014-12-02 DIAGNOSIS — F10929 Alcohol use, unspecified with intoxication, unspecified: Secondary | ICD-10-CM | POA: Insufficient documentation

## 2014-12-02 DIAGNOSIS — F10229 Alcohol dependence with intoxication, unspecified: Secondary | ICD-10-CM | POA: Diagnosis present

## 2014-12-02 HISTORY — DX: Anxiety disorder, unspecified: F41.9

## 2014-12-02 HISTORY — DX: Depression, unspecified: F32.A

## 2014-12-02 HISTORY — DX: Major depressive disorder, single episode, unspecified: F32.9

## 2014-12-02 LAB — CBC
HCT: 41.4 % (ref 39.0–52.0)
HEMOGLOBIN: 13.8 g/dL (ref 13.0–17.0)
MCH: 30.7 pg (ref 26.0–34.0)
MCHC: 33.3 g/dL (ref 30.0–36.0)
MCV: 92 fL (ref 78.0–100.0)
Platelets: 189 10*3/uL (ref 150–400)
RBC: 4.5 MIL/uL (ref 4.22–5.81)
RDW: 12.3 % (ref 11.5–15.5)
WBC: 10.8 10*3/uL — ABNORMAL HIGH (ref 4.0–10.5)

## 2014-12-02 LAB — COMPREHENSIVE METABOLIC PANEL
ALT: 16 U/L — AB (ref 17–63)
AST: 13 U/L — AB (ref 15–41)
Albumin: 4.4 g/dL (ref 3.5–5.0)
Alkaline Phosphatase: 64 U/L (ref 38–126)
Anion gap: 12 (ref 5–15)
BUN: 10 mg/dL (ref 6–20)
CALCIUM: 9.1 mg/dL (ref 8.9–10.3)
CO2: 22 mmol/L (ref 22–32)
Chloride: 102 mmol/L (ref 101–111)
Creatinine, Ser: 1.09 mg/dL (ref 0.61–1.24)
GFR calc Af Amer: >60 mL/min (ref >60)
GFR calc non Af Amer: >60 mL/min (ref >60)
Glucose, Bld: 93 mg/dL (ref 65–99)
Potassium: 3.5 mmol/L (ref 3.5–5.1)
Sodium: 136 mmol/L (ref 135–145)
Total Bilirubin: 1.2 mg/dL (ref 0.3–1.2)
Total Protein: 7.8 g/dL (ref 6.5–8.1)

## 2014-12-02 LAB — SALICYLATE LEVEL

## 2014-12-02 LAB — ETHANOL: Alcohol, Ethyl (B): 95 mg/dL — ABNORMAL HIGH (ref <5)

## 2014-12-02 LAB — ACETAMINOPHEN LEVEL: Acetaminophen (Tylenol), Serum: <10 ug/mL — ABNORMAL LOW (ref 10–30)

## 2014-12-02 MED ORDER — MAGNESIUM HYDROXIDE 400 MG/5ML PO SUSP: 30.0000 mL | Freq: Every day | ORAL | Status: DC | PRN

## 2014-12-02 MED ORDER — TRAZODONE HCL 50 MG PO TABS
50.0000 mg | ORAL_TABLET | Freq: Every evening | ORAL | Status: DC | PRN
  Administered 2014-12-02 – 2014-12-05 (×4): 50 mg via ORAL
  Filled 2014-12-02 (×4): qty 1
  Filled 2014-12-02: qty 14

## 2014-12-02 MED ORDER — ALUM & MAG HYDROXIDE-SIMETH 200-200-20 MG/5ML PO SUSP: 30.0000 mL | ORAL | Status: DC | PRN

## 2014-12-02 MED ORDER — ACETAMINOPHEN 325 MG PO TABS: 650.0000 mg | ORAL_TABLET | Freq: Four times a day (QID) | ORAL | Status: DC | PRN

## 2014-12-02 NOTE — BH Assessment (Signed)
Consulted with Claudette Head, NP who states that patient meets inpatient criteria at this time.

## 2014-12-02 NOTE — Tx Team (Signed)
Initial Interdisciplinary Treatment Plan   PATIENT STRESSORS: Substance abuse   PATIENT STRENGTHS: Average or above average intelligence Work skills   PROBLEM LIST: Problem List/Patient Goals Date to be addressed Date deferred Reason deferred Estimated date of resolution  Depression 12/02/14     Suicidal Ideation 12/02/14     Substance abuse 12/02/14     "I want to feel better about myself"                                     DISCHARGE CRITERIA:  Improved stabilization in mood, thinking, and/or behavior Need for constant or close observation no longer present Verbal commitment to aftercare and medication compliance Withdrawal symptoms are absent or subacute and managed without 24-hour nursing intervention  PRELIMINARY DISCHARGE PLAN: Return to previous work or school arrangements  PATIENT/FAMIILY INVOLVEMENT: This treatment plan has been presented to and reviewed with the patient, Joseph [name].  The patient and family have been given the opportunity to ask questions and make suggestions.  Juliann Pares 12/02/2014, 9:43 PM

## 2014-12-02 NOTE — BHH Counselor (Signed)
Patient reviewed and signed voluntary consent to treat and release of information.

## 2014-12-02 NOTE — BH Assessment (Signed)
[facility] has been contacted to transport patient to [facility]. Call [phone] for Nurse Report

## 2014-12-02 NOTE — BH Assessment (Addendum)
Assessment Note  Joseph [name] is an 34 y.o. male who presents to [facility] voluntarily stating that he has had thoughts of harming himself and someone else for "about a month and a half." Patient states that he cannot think of any recent stressors other than starting a new job. Patient states that he has been increasingly depressed and is unable to identify a reason. Patient endorses symptoms of depression as; despondent, difficulty falling and staying asleep, poor appetite, isolating himself from others, feeling worthless, hopeless, angry/irritable, and guilty. Patient states that he sometimes has crying spells and he becomes agitated easily. Patient states that he also feels anxious at times and feels that he may have had panic attacks in the past.  Patient states that he currently uses alcohol, cocaine, and THC. Patient states that he has been drinking alcohol since age 89 and he drinks a 6pack-12pack up to 4 times per week and last drink 80 ounces today. Patient states that he started using cocaine when he was about 25 and he uses "a 20 up to 3 grams" and has been using more lately. Patient sates that he last used 12/01/2014 about "a 20." Patient states that he uses THC 1-2 times weekly and last used " a week or two ago." Patient states that he has become increasingly agitated and has felt that he may hurt someone else up to killing them but does not identify a victim. Patient states "just assholes, anybody." patient states that he does not have a history of violence and this concerns him. Patient states that due to his depression he has wanted to harm himself but thinks about his 11 year old daughter. Patient denies previous attempts. Patient states that he does not have access to weapons and he currently lives with his grandmother which he feels is a safe environment. Patient states that he had outpatient substance abuse treatment "about three years ago" at [facility] which he felt was helpful at that time.  Patient denies current psychiatric treatment.  Patient denies a history of abuse and legal involvement. Patient states that he would like help with "learning to cope with things" and stop using drugs and alcohol. Patient states that his appetite is "horrible" and he has lost "about ten pounds" over the past month. Patient states that he has difficulty falling and staying asleep and generally sleeps 2-4 hours per night. Patient was alert and oriented to person, place, time, and situation. Patient was pleasant and cooperative with assessment.   Axis I: Substance Induced Mood Disorder Axis II: Deferred Axis III:  Past Medical History  Diagnosis Date  . Narcotic abuse   . Asthma     as a child   Axis IV: occupational problems, other psychosocial or environmental problems and problems with primary support group Axis V: 41-50 serious symptoms  Past Medical History:  Past Medical History  Diagnosis Date  . Narcotic abuse   . Asthma     as a child    Past Surgical History  Procedure Laterality Date  . I&d extremity  03/24/2012    Procedure: IRRIGATION AND DEBRIDEMENT EXTREMITY;  Surgeon: Eldred Manges, MD;  Location: [facility] OR;  Service: Orthopedics;  Laterality: Right;  Right Forearm  . Closed reduction finger with percutaneous pinning Right 06/22/2013    Procedure: RIGHT HAND SMALL FINGER CLOSED MANIPULATION AND PINNING;  Surgeon: Sharma Covert, MD;  Location: [facility] OR;  Service: Orthopedics;  Laterality: Right;    Family History: No family history on file.  Social History:  reports that he has been smoking Cigarettes.  He has a 10 pack-year smoking history. He has never used smokeless tobacco. He reports that he drinks about 7.2 oz of alcohol per week. He reports that he uses illicit drugs (Marijuana).  Additional Social History:  Alcohol / Drug Use Pain Medications: See MAR Prescriptions: See MAR Over the Counter: See MAR History of alcohol / drug use?: Yes Longest period of sobriety  (when/how long): Few Months Substance #1 Name of Substance 1: Alcohol 1 - Age of First Use: 17 1 - Amount (size/oz): 6pk-12pk 1 - Frequency: 3-4x/week 1 - Duration: ongoing 1 - Last Use / Amount: 12/02/2014 - 80 ounces Substance #2 Name of Substance 2: THC  2 - Age of First Use: 15 2 - Amount (size/oz): 1-1.5grams 2 - Frequency: 1-2x/week 2 - Duration: ongoing 2 - Last Use / Amount: 2 weeks ago Substance #3 Name of Substance 3: Cocaine 3 - Age of First Use: 25 3 - Amount (size/oz): 20-3grams 3 - Frequency: 1-2x/week 3 - Duration: ongoing 3 - Last Use / Amount: 12/01/2014 .3 grams  CIWA: CIWA-Ar BP: 140/88 mmHg Pulse Rate: 93 COWS:    Allergies: No Known Allergies  Home Medications:  (Not in a hospital admission)  OB/GYN Status:  No LMP for male patient.  General Assessment Data Location of Assessment: [facility] TTS Assessment: In system Is this a Tele or Face-to-Face Assessment?: Face-to-Face Is this an Initial Assessment or a Re-assessment for this encounter?: Initial Assessment Marital status: Divorced Living Arrangements: Other relatives Can pt return to current living arrangement?: Yes Admission Status: Voluntary Is patient capable of signing voluntary admission?: Yes Referral Source: Other Insurance type: Medicaid     Crisis Care Plan Living Arrangements: Other relatives Name of Psychiatrist: N/A Name of Therapist: N/A  Education Status Is patient currently in school?: No Highest grade of school patient has completed: Some College  Risk to self with the past 6 months Suicidal Ideation: Yes-Currently Present Has patient been a risk to self within the past 6 months prior to admission? : Yes Suicidal Intent: No Has patient had any suicidal intent within the past 6 months prior to admission? : Yes Is patient at risk for suicide?: Yes Suicidal Plan?: No Has patient had any suicidal plan within the past 6 months prior to admission? : No Access to Means:  No What has been your use of drugs/alcohol within the last 12 months?: Cocaine, THC, Alcohol Previous Attempts/Gestures: No How many times?: 0 Other Self Harm Risks: Denies Triggers for Past Attempts: None known Intentional Self Injurious Behavior: None Family Suicide History: No Recent stressful life event(s): Other (Comment) (New Job/Depression) Persecutory voices/beliefs?: No Depression: Yes Depression Symptoms: Despondent, Insomnia, Tearfulness, Isolating, Fatigue, Guilt, Loss of interest in usual pleasures, Feeling worthless/self pity, Feeling angry/irritable Substance abuse history and/or treatment for substance abuse?: Yes Suicide prevention information given to non-admitted patients: Not applicable  Risk to Others within the past 6 months Homicidal Ideation: Yes-Currently Present Does patient have any lifetime risk of violence toward others beyond the six months prior to admission? : No Thoughts of Harm to Others: Yes-Currently Present Comment - Thoughts of Harm to Others: Thoughts of fighting people and escalating to killing Current Homicidal Intent: No Current Homicidal Plan: No Access to Homicidal Means: No Identified Victim: Denies History of harm to others?: No Assessment of Violence: None Noted Violent Behavior Description: Denies Does patient have access to weapons?: No Criminal Charges Pending?: No Does patient have  a court date: No Is patient on probation?: No  Psychosis Hallucinations: None noted Delusions: None noted  Mental Status Report Appearance/Hygiene: Unremarkable Eye Contact: Fair Motor Activity: Freedom of movement Speech: Logical/coherent Level of Consciousness: Alert Mood: Anxious, Ashamed/humiliated Affect: Anxious, Appropriate to circumstance Anxiety Level: Severe Thought Processes: Coherent, Relevant Judgement: Unimpaired Orientation: Person, Place, Time, Situation Obsessive Compulsive Thoughts/Behaviors: None  Cognitive  Functioning Concentration: Decreased Memory: Recent Intact, Remote Intact IQ: Average Insight: Fair Impulse Control: Poor Appetite: Poor Weight Loss: 10 Sleep: Decreased Total Hours of Sleep: 3 Vegetative Symptoms: Decreased grooming  ADLScreening [facility] Assessment Services) Patient's cognitive ability adequate to safely complete daily activities?: Yes Patient able to express need for assistance with ADLs?: Yes Independently performs ADLs?: Yes (appropriate for developmental age)  Prior Inpatient Therapy Prior Inpatient Therapy: No Prior Therapy Dates: N/A Prior Therapy Facilty/Provider(s): N/A Reason for Treatment: N/A  Prior Outpatient Therapy Prior Outpatient Therapy: Yes Prior Therapy Dates: 2013 Prior Therapy Facilty/Provider(s): [facility] Reason for Treatment: SA Does patient have an ACCT team?: No Does patient have Intensive In-House Services?  : No Does patient have [facility] services? : No Does patient have P4CC services?: No  ADL Screening (condition at time of admission) Patient's cognitive ability adequate to safely complete daily activities?: Yes Is the patient deaf or have difficulty hearing?: No Does the patient have difficulty seeing, even when wearing glasses/contacts?: No Does the patient have difficulty concentrating, remembering, or making decisions?: No Patient able to express need for assistance with ADLs?: Yes Does the patient have difficulty dressing or bathing?: No Independently performs ADLs?: Yes (appropriate for developmental age) Does the patient have difficulty walking or climbing stairs?: No Weakness of Legs: None Weakness of Arms/Hands: None  Home Assistive Devices/Equipment Home Assistive Devices/Equipment: None    Abuse/Neglect Assessment (Assessment to be complete while patient is alone) Physical Abuse: Denies Verbal Abuse: Denies Sexual Abuse: Denies Exploitation of patient/patient's resources: Denies Self-Neglect: Denies      Merchant navy officer (For Healthcare) Does patient have an advance directive?: No Would patient like information on creating an advanced directive?: No - patient declined information    Additional Information 1:1 In Past 12 Months?: No CIRT Risk: No Elopement Risk: No Does patient have medical clearance?: Yes     Disposition:  Disposition Initial Assessment Completed for this Encounter: Yes Disposition of Patient: Inpatient treatment program Type of inpatient treatment program: Adult  On Site Evaluation by:   Reviewed with Physician:    Nhan Qualley 12/02/2014 7:22 PM

## 2014-12-02 NOTE — BH Assessment (Signed)
Consulted with Berneice Heinrich, RN, [facility] who states that patient has been accepted to [facility], Dr. Dub Mikes attending.

## 2014-12-02 NOTE — ED Provider Notes (Signed)
CSN: [id number]     Arrival date & time 12/02/14  1701 History   First MD Initiated Contact with Patient 12/02/14 1714     Chief Complaint  Patient presents with  . Suicidal  . Homicidal     (Consider location/radiation/quality/duration/timing/severity/associated sxs/prior Treatment) HPI Comments: Patient presents to the emergency department with chief complaints of suicidal and homicidal ideations. Patient states that he becomes very agitated and when he becomes agitated or angry he has filed thoughts towards whomever is making him agitated or angry. He also states that he has thoughts of wishing to commit suicide to escape it all.  He states that he has also started to use cocaine in addition to pain pills and approximately 80 oz of etoh per day.  He denies any other symptoms.  Patient denies headache, blurred vision, new hearing loss, sore throat, chest pain, shortness of breath, nausea, vomiting, diarrhea, constipation, dysuria, peripheral edema, back pain, numbness or tingling of the extremities.   The history is provided by the patient. No language interpreter was used.    Past Medical History  Diagnosis Date  . Narcotic abuse   . Asthma     as a child   Past Surgical History  Procedure Laterality Date  . I&d extremity  03/24/2012    Procedure: IRRIGATION AND DEBRIDEMENT EXTREMITY;  Surgeon: Eldred Manges, MD;  Location: [facility] OR;  Service: Orthopedics;  Laterality: Right;  Right Forearm  . Closed reduction finger with percutaneous pinning Right 06/22/2013    Procedure: RIGHT HAND SMALL FINGER CLOSED MANIPULATION AND PINNING;  Surgeon: Sharma Covert, MD;  Location: [facility] OR;  Service: Orthopedics;  Laterality: Right;   No family history on file. History  Substance Use Topics  . Smoking status: Current Every Day Smoker -- 1.00 packs/day for 10 years    Types: Cigarettes  . Smokeless tobacco: Never Used  . Alcohol Use: 7.2 oz/week    12 Cans of beer per week    Review of Systems   Constitutional: Negative for fever and chills.  Respiratory: Negative for shortness of breath.   Cardiovascular: Negative for chest pain.  Gastrointestinal: Negative for nausea, vomiting, diarrhea and constipation.  Genitourinary: Negative for dysuria.  Psychiatric/Behavioral: Positive for suicidal ideas.  All other systems reviewed and are negative.     Allergies  Review of patient's allergies indicates no known allergies.  Home Medications   Prior to Admission medications   Medication Sig Start Date End Date Taking? Authorizing Provider  acetaminophen (TYLENOL) 500 MG tablet Take 1,000 mg by mouth every 6 (six) hours as needed for mild pain.     Historical Provider, MD  docusate sodium (COLACE) 100 MG capsule Take 1 capsule (100 mg total) by mouth 2 (two) times daily. 06/22/13   Bradly Bienenstock, MD  oxyCODONE-acetaminophen (PERCOCET) 10-325 MG per tablet Take 1 tablet by mouth every 4 (four) hours as needed for pain. 06/22/13   Bradly Bienenstock, MD  oxyCODONE-acetaminophen (PERCOCET/ROXICET) 5-325 MG per tablet Take 2 tablets by mouth every 4 (four) hours as needed for severe pain.    Historical Provider, MD   BP 140/88 mmHg  Pulse 93  Temp(Src) 98 F (36.7 C) (Oral)  Resp 18  SpO2 97% Physical Exam  Constitutional: He is oriented to person, place, and time. He appears well-developed and well-nourished.  HENT:  Head: Normocephalic and atraumatic.  Eyes: Conjunctivae and EOM are normal. Pupils are equal, round, and reactive to light. Right eye exhibits no discharge. Left eye exhibits  no discharge. No scleral icterus.  Neck: Normal range of motion. Neck supple. No JVD present.  Cardiovascular: Normal rate, regular rhythm and normal heart sounds.  Exam reveals no gallop and no friction rub.   No murmur heard. Pulmonary/Chest: Effort normal and breath sounds normal. No respiratory distress. He has no wheezes. He has no rales. He exhibits no tenderness.  Abdominal: Soft. He exhibits no  distension and no mass. There is no tenderness. There is no rebound and no guarding.  Musculoskeletal: Normal range of motion. He exhibits no edema or tenderness.  Neurological: He is alert and oriented to person, place, and time.  Skin: Skin is warm and dry.  Psychiatric: He has a normal mood and affect. His behavior is normal. Judgment and thought content normal.  Nursing note and vitals reviewed.   ED Course  Procedures (including critical care time) Results for orders placed or performed during the hospital encounter of 12/02/14  Acetaminophen level  Result Value Ref Range   Acetaminophen (Tylenol), Serum <10 (L) 10 - 30 ug/mL  CBC  Result Value Ref Range   WBC 10.8 (H) 4.0 - 10.5 K/uL   RBC 4.50 4.22 - 5.81 MIL/uL   Hemoglobin 13.8 13.0 - 17.0 g/dL   HCT 02.3 34.3 - 56.8 %   MCV 92.0 78.0 - 100.0 fL   MCH 30.7 26.0 - 34.0 pg   MCHC 33.3 30.0 - 36.0 g/dL   RDW 61.6 83.7 - 29.0 %   Platelets 189 150 - 400 K/uL  Comprehensive metabolic panel  Result Value Ref Range   Sodium 136 135 - 145 mmol/L   Potassium 3.5 3.5 - 5.1 mmol/L   Chloride 102 101 - 111 mmol/L   CO2 22 22 - 32 mmol/L   Glucose, Bld 93 65 - 99 mg/dL   BUN 10 6 - 20 mg/dL   Creatinine, Ser 2.11 0.61 - 1.24 mg/dL   Calcium 9.1 8.9 - 15.5 mg/dL   Total Protein 7.8 6.5 - 8.1 g/dL   Albumin 4.4 3.5 - 5.0 g/dL   AST 13 (L) 15 - 41 U/L   ALT 16 (L) 17 - 63 U/L   Alkaline Phosphatase 64 38 - 126 U/L   Total Bilirubin 1.2 0.3 - 1.2 mg/dL   GFR calc non Af Amer >60 >60 mL/min   GFR calc Af Amer >60 >60 mL/min   Anion gap 12 5 - 15  Ethanol (ETOH)  Result Value Ref Range   Alcohol, Ethyl (B) 95 (H) <5 mg/dL  Salicylate level  Result Value Ref Range   Salicylate Lvl <4.0 2.8 - 30.0 mg/dL   No results found.   Imaging Review No results found.   EKG Interpretation None      MDM   Final diagnoses:  Suicidal ideation  Homicidal ideations    Patient with SI/HI.  Also using large amounts of cocaine  and etoh.  Will consult TTS.  7:15 PM Dr. Criss Alvine received phone call that patient has been accepted at [facility].  EMTALA completed by Dr. Criss Alvine.    Roxy Horseman, PA-C 12/02/14 1915  Pricilla Loveless, MD 12/03/14 1705

## 2014-12-02 NOTE — Progress Notes (Signed)
Admission note:  Pt is 34 year old AAM admitted to the services of Dr. Dub Mikes for suicidal ideation, depression, anger issues, and substance abuse.  Pt did not wish to talk much during admission stating that his stressors are just overwhelming at the moment.  Pt does stated that he is employed and his job will need to be contacted regarding his hospital stay.  He listed his daughter as his biggest deterrent for suicide.  He presently lives with his great grandmother.  Pt states he has been drinking daily beer for the most part and using cocaine and THC when he can access them.  Pt lists his mother as his emergency contact, [name].  She may be reached at [phone]

## 2014-12-02 NOTE — ED Notes (Signed)
ALL PATIENT BELONGINGS TAKEN HOME WITH Joseph [name].

## 2014-12-02 NOTE — ED Notes (Addendum)
Pt reports increased SI/HI over past month but denies specific plans. Pt not forthcoming with information.

## 2014-12-03 ENCOUNTER — Encounter (HOSPITAL_COMMUNITY): Payer: Self-pay | Admitting: Psychiatry

## 2014-12-03 DIAGNOSIS — F1220 Cannabis dependence, uncomplicated: Secondary | ICD-10-CM | POA: Diagnosis present

## 2014-12-03 DIAGNOSIS — R45850 Homicidal ideations: Secondary | ICD-10-CM

## 2014-12-03 DIAGNOSIS — F1094 Alcohol use, unspecified with alcohol-induced mood disorder: Secondary | ICD-10-CM

## 2014-12-03 DIAGNOSIS — F3289 Other specified depressive episodes: Secondary | ICD-10-CM

## 2014-12-03 DIAGNOSIS — F1020 Alcohol dependence, uncomplicated: Secondary | ICD-10-CM | POA: Diagnosis present

## 2014-12-03 DIAGNOSIS — F1420 Cocaine dependence, uncomplicated: Secondary | ICD-10-CM | POA: Diagnosis present

## 2014-12-03 DIAGNOSIS — F1024 Alcohol dependence with alcohol-induced mood disorder: Secondary | ICD-10-CM

## 2014-12-03 DIAGNOSIS — F10229 Alcohol dependence with intoxication, unspecified: Secondary | ICD-10-CM | POA: Diagnosis present

## 2014-12-03 DIAGNOSIS — R45851 Suicidal ideations: Secondary | ICD-10-CM

## 2014-12-03 LAB — TSH: TSH: 1.068 u[IU]/mL (ref 0.350–4.500)

## 2014-12-03 MED ORDER — CHLORDIAZEPOXIDE HCL 25 MG PO CAPS
25.0000 mg | ORAL_CAPSULE | Freq: Three times a day (TID) | ORAL | Status: AC
  Administered 2014-12-04 (×3): 25 mg via ORAL
  Filled 2014-12-03 (×3): qty 1

## 2014-12-03 MED ORDER — ONDANSETRON 4 MG PO TBDP: 4.0000 mg | ORAL_TABLET | Freq: Four times a day (QID) | ORAL | Status: AC | PRN

## 2014-12-03 MED ORDER — CHLORDIAZEPOXIDE HCL 25 MG PO CAPS
25.0000 mg | ORAL_CAPSULE | ORAL | Status: AC
  Administered 2014-12-05 (×2): 25 mg via ORAL
  Filled 2014-12-03 (×2): qty 1

## 2014-12-03 MED ORDER — LOPERAMIDE HCL 2 MG PO CAPS: 2.0000 mg | ORAL_CAPSULE | ORAL | Status: AC | PRN

## 2014-12-03 MED ORDER — VITAMIN B-1 100 MG PO TABS
100.0000 mg | ORAL_TABLET | Freq: Every day | ORAL | Status: DC
  Administered 2014-12-04 – 2014-12-06 (×3): 100 mg via ORAL
  Filled 2014-12-03 (×6): qty 1

## 2014-12-03 MED ORDER — THIAMINE HCL 100 MG/ML IJ SOLN
100.0000 mg | Freq: Once | INTRAMUSCULAR | Status: AC
  Administered 2014-12-03: 100 mg via INTRAMUSCULAR
  Filled 2014-12-03: qty 2

## 2014-12-03 MED ORDER — CHLORDIAZEPOXIDE HCL 25 MG PO CAPS
25.0000 mg | ORAL_CAPSULE | Freq: Every day | ORAL | Status: AC
  Administered 2014-12-06: 25 mg via ORAL
  Filled 2014-12-03: qty 1

## 2014-12-03 MED ORDER — HYDROXYZINE HCL 25 MG PO TABS
25.0000 mg | ORAL_TABLET | Freq: Four times a day (QID) | ORAL | Status: AC | PRN
  Administered 2014-12-05 (×2): 25 mg via ORAL
  Filled 2014-12-03 (×2): qty 1

## 2014-12-03 MED ORDER — CHLORDIAZEPOXIDE HCL 25 MG PO CAPS
25.0000 mg | ORAL_CAPSULE | Freq: Four times a day (QID) | ORAL | Status: AC
  Administered 2014-12-03 (×3): 25 mg via ORAL
  Filled 2014-12-03 (×3): qty 1

## 2014-12-03 MED ORDER — ADULT MULTIVITAMIN W/MINERALS CH
1.0000 | ORAL_TABLET | Freq: Every day | ORAL | Status: DC
  Administered 2014-12-03 – 2014-12-06 (×4): 1 via ORAL
  Filled 2014-12-03 (×7): qty 1
  Filled 2014-12-03: qty 14
  Filled 2014-12-03: qty 1

## 2014-12-03 MED ORDER — CHLORDIAZEPOXIDE HCL 25 MG PO CAPS: 25.0000 mg | ORAL_CAPSULE | Freq: Four times a day (QID) | ORAL | Status: AC | PRN

## 2014-12-03 MED ORDER — NICOTINE 21 MG/24HR TD PT24
21.0000 mg | MEDICATED_PATCH | Freq: Every day | TRANSDERMAL | Status: DC
  Administered 2014-12-03 – 2014-12-06 (×4): 21 mg via TRANSDERMAL
  Filled 2014-12-03 (×7): qty 1

## 2014-12-03 MED ORDER — BACITRACIN-NEOMYCIN-POLYMYXIN OINTMENT TUBE
TOPICAL_OINTMENT | Freq: Every day | CUTANEOUS | Status: DC
  Administered 2014-12-03 – 2014-12-05 (×3): via TOPICAL
  Administered 2014-12-06: 1 via TOPICAL
  Filled 2014-12-03: qty 1
  Filled 2014-12-03: qty 15

## 2014-12-03 MED ORDER — SERTRALINE HCL 25 MG PO TABS
25.0000 mg | ORAL_TABLET | Freq: Every day | ORAL | Status: DC
  Administered 2014-12-03 – 2014-12-05 (×3): 25 mg via ORAL
  Filled 2014-12-03 (×6): qty 1

## 2014-12-03 NOTE — BHH Group Notes (Signed)
Pt did not attend 0900 nursing group.  

## 2014-12-03 NOTE — Progress Notes (Signed)
Patient did attend the evening speaker AA meeting.  

## 2014-12-03 NOTE — BHH Group Notes (Signed)
[facility] Group Notes:  (Clinical Social Work)  12/03/2014  10:00-11:00AM  Summary of Progress/Problems:   The main focus of today's process group was to   1)  discuss the importance of adding supports  2)  define health supports versus unhealthy supports  3)  identify the patient's current unhealthy supports and plan how to handle them  4)  Identify the patient's current healthy supports and plan what to add.  An emphasis was placed on using counselor, doctor, therapy groups, 12-step groups, and problem-specific support groups to expand supports.    The patient expressed full comprehension of the concepts presented, and agreed that there is a need to add more supports.  The patient stated little during group but listened closely, was engaged and smiled.  He appeared to be motivated for treatment.  Type of Therapy:  Process Group with Motivational Interviewing  Participation Level:  Active  Participation Quality:  Attentive  Affect:  Appropriate  Cognitive:  Alert  Insight:  Engaged  Engagement in Therapy:  Engaged  Modes of Intervention:   Education, Support and Processing, Activity  Ambrose Mantle, LCSW 12/03/2014

## 2014-12-03 NOTE — [facility] Counselor (Signed)
Adult Comprehensive Assessment  Patient ID: Joseph [name], male   DOB: 01-Nov-1980, 34 y.o.   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 Financial / Lack of resources (include bankruptcy): Trying to catch up financially. Housing / Lack of housing: Denies stressors Physical health (include injuries & life threatening diseases): Denies stressors Social relationships: Denies stressors Substance abuse: Drugs, period, is stressful. Bereavement / Loss: Denies stressors  Living/Environment/Situation:  Living Arrangements: Other relatives (Great-grandmother) Living conditions (as described by patient or guardian): Has his own room, safe How long has patient lived in current situation?: 2 years What is atmosphere in current home: Loving, Comfortable  Family History:  Marital status: Divorced Divorced, when?: 2003 separated, just recently divorced What types of issues is patient dealing with in the relationship?: Have a child, so they have to deal with each other.  She is never around, so he is always wondering about the kids. Does patient have children?: Yes How many children?: 1 How is patient's relationship with their children?: 13yo daughter - great relationship, gets to see her every week  Childhood History:  By whom was/is the patient raised?: Grandparents, Mother Additional childhood history information: "Stayed with a little bit of everybody in the family" Description of patient's relationship with caregiver when they were a child: Fair relationship with mother, was not a loving mother.  No real relationship with biological father.  Great-grandmother has always been there for him. Patient's description  of current relationship with people who raised him/her: Better relationship with mother now.  Texts a little bit with his father, that's about the extent of the relationship.  Great-grandmother continues to be very supportive. Does patient have siblings?: Yes Number of Siblings: 2 Description of patient's current relationship with siblings: Has 2 younger siblings, relationship with them is "on and off." Did patient suffer any verbal/emotional/physical/sexual abuse as a child?: No Did patient suffer from severe childhood neglect?: No Has patient ever been sexually abused/assaulted/raped as an adolescent or adult?: No Was the patient ever a victim of a crime or a disaster?: No Witnessed domestic violence?: No Has patient been effected by domestic violence as an adult?: No  Education:  Highest grade of school patient has completed: Some college Currently a Consulting civil engineer?: No Learning disability?: No  Employment/Work Situation:   Employment situation: Employed 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  Uses them only when he gets really down.   Has been using over 10 years, something every day, not necessarily the same thing. Alcohol/Substance Abuse Treatment Hx: Past detox, Past Tx, Outpatient If yes,  describe treatment:  [facility], does not attend AA/NA, has not been to rehab previously Has alcohol/substance abuse ever caused legal problems?: No  Social Support System:   Patient's Community Support System: Fair Museum/gallery exhibitions officer System: Great-grandmother, mother, aunts Type of faith/religion: Christian/Baptist How does patient's faith help to cope with current illness?: Prays about things  Leisure/Recreation:   Leisure and Hobbies: Used to, not anymore.  Strengths/Needs:   What things does the patient do well?: Used to draw, make music, participate in sports (football, basketball, wrestling) In what areas does patient struggle / problems for patient: Stability, focus, motivation to get up every day and go to work  Discharge Plan:   Does patient have access to transportation?: Yes Will patient be returning to same living situation after discharge?: Yes Currently receiving community mental health services: No (Dr. Dellis Filbert 2 years ago, felt better and stopped going.) If no, would patient like referral for services when discharged?: Yes (What county?) ([place]) Does patient have financial barriers related to discharge medications?: Yes Patient description of barriers related to discharge medications: No income, no insurance  Summary/Recommendations:    Joseph [name] is a 34yo male hospitalized for SI, depression, alcohol abuse/detox.  He lives with great-grandmother, does not indicate interest in rehab but at this point not sure what to do, has never been to rehab.  Has no current psychiatrist or therapy, is interested in referrals.  The patient would benefit from safety monitoring, medication evaluation, psychoeducation, group therapy, and discharge planning to link with ongoing resources. The patient declined referral to [facility] for smoking cessation.  The Discharge Process and Patient Involvement form was reviewed with patient at the end of the Psychosocial  Assessment, and the patient confirmed understanding and signed that document, which was placed in the paper chart. Suicide Prevention Education was reviewed thoroughly, and a brochure left with patient.  The patient signed consent for SPE to be provided to mother Joseph [name] [phone].  Joseph [name]. 12/03/2014

## 2014-12-03 NOTE — [facility] Suicide Risk Assessment (Addendum)
[facility] Admission Suicide Risk Assessment   Nursing information obtained from:  Patient Demographic factors:  Male Current Mental Status:  Suicidal ideation indicated by patient, Suicidal ideation indicated by others, Self-harm thoughts Loss Factors:  NA Historical Factors:  Family history of mental illness or substance abuse Risk Reduction Factors:  Responsible for children under 34 years of age, Sense of responsibility to family, Living with another person, especially a relative Total Time spent with patient: 30 minutes Principal Problem: Alcohol-induced depressive disorder with moderate or severe use disorder with onset during intoxication Diagnosis:   Patient Active Problem List   Diagnosis Date Noted  . Alcohol use disorder, severe, dependence [F10.20] 12/03/2014  . 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 Cocaine use disorder, severe, dependence [F14.20] 12/03/2014  . Cannabis use disorder, severe, dependence [F12.20] 12/03/2014  . Abscess of right forearm [L02.413] 03/24/2012     Continued Clinical Symptoms:  Alcohol Use Disorder Identification Test Final Score (AUDIT): 22 The "Alcohol Use Disorders Identification Test", Guidelines for Use in Primary Care, Second Edition.  World Science writer [facility]). Score between 0-7:  no or low risk or alcohol related problems. Score between 8-15:  moderate risk of alcohol related problems. Score between 16-19:  high risk of alcohol related problems. Score 20 or above:  warrants further diagnostic evaluation for alcohol dependence and treatment.   CLINICAL FACTORS:   Depression:   Comorbid alcohol abuse/dependence Alcohol/Substance Abuse/Dependencies   Musculoskeletal: Strength & Muscle Tone: within normal limits Gait & Station: normal Patient leans: N/A  Psychiatric Specialty Exam: Physical Exam  Review of Systems  Psychiatric/Behavioral:  Positive for depression, suicidal ideas and substance abuse. The patient is nervous/anxious.   All other systems reviewed and are negative.   Blood pressure 116/71, pulse 93, temperature 99 F (37.2 C), temperature source Oral, resp. rate 17, height 5\' 9"  (1.753 m), weight 104.781 kg (231 lb).Body mass index is 34.1 kg/(m^2).  General Appearance: Casual  Eye Contact::  Good  Speech:  Clear and Coherent  Volume:  Normal  Mood:  Anxious and Depressed  Affect:  Appropriate  Thought Process:  Coherent  Orientation:  Full (Time, Place, and Person)  Thought Content:  Rumination  Suicidal Thoughts:  Yes.  without intent/plan  Homicidal Thoughts:  Yes.  without intent/plan  Memory:  Immediate;   Fair Recent;   Fair Remote;   Fair  Judgement:  Impaired  Insight:  Lacking  Psychomotor Activity:  Normal  Concentration:  Fair  Recall:  [employer] of Knowledge:Fair  Language: Fair  Akathisia:  No  Handed:  Right  AIMS (if indicated):     Assets:  Communication Skills Desire for Improvement  Sleep:     Cognition: WNL  ADL's:  Intact     COGNITIVE FEATURES THAT CONTRIBUTE TO RISK:  Closed-mindedness, Polarized thinking and Thought constriction (tunnel vision)    SUICIDE RISK:   Moderate:  Frequent suicidal ideation with limited intensity, and duration, some specificity in terms of plans, no associated intent, good self-control, limited dysphoria/symptomatology, some risk factors present, and identifiable protective factors, including available and accessible social support.  PLAN OF CARE: Patient will benefit from inpatient treatment and stabilization.  Estimated length of stay is 5-7 days.  Reviewed past medical records,treatment plan.   Will start Libirum /CIWA protocol for alcohol withdrawal sx. Will observe patient on the unit - for SI . Patient also is homicidal towards a cousin- will continue inpatient stay.  Will provide Zoloft 25 mg po daily for depression , anxiety - this  has worked in the past. Will provide Vistaril 25 mg po prn for breakthrough anxiety sx. Will start Trazodone 50 mg po qhs prn for sleep.  Will continue to monitor vitals ,medication compliance and treatment side effects while patient is here.  Will monitor for medical issues as well as call consult as needed.  Reviewed labs cbc,cmp,bal,uds- TSH - will be done tonight. CSW will start working on disposition.  Patient to participate in therapeutic milieu .       Medical Decision Making:  Review of Psycho-Social Stressors (1), Review or order clinical lab tests (1), Established Problem, Worsening (2), Review of Last Therapy Session (1), Review of Medication Regimen & Side Effects (2) and Review of New Medication or Change in Dosage (2)  I certify that inpatient services furnished can reasonably be expected to improve the patient's condition.   Tymon Nemetz md 12/03/2014, 12:57 PM

## 2014-12-03 NOTE — Progress Notes (Signed)
D: Pt's mood is depressed. He rates his anxiety, depression, and hopelessness 10/10 but denies SI/HI/AVH. He reports that his goal for today is to get through today. He has been pleasant and cooperative. Minimally interactive with peers.  A: Support given. Verbalization encouraged. Pt encouraged to come to staff for any concerns. Medications given as prescribed. R: Pt is receptive. No complaints of pain or discomfort at this time. Q15 min safety checks maintained. Pt remains safe on the unit. Will continue to monitor.

## 2014-12-03 NOTE — Progress Notes (Signed)
D.  Pt presents with flat affect on approach, but brightens with conversation.  No complaints at this time, no s/s of withdrawal noted.  Positive for evening AA group, interacting appropriately with peers on unit.  Continues to endorse passive SI with no specific plan, but does contract for safety on the unit.  No HI or hallucinations noted.  A.  Support and encouragement offered  R. Pt remains safe on the unit, will continue to monitor.

## 2014-12-03 NOTE — H&P (Signed)
Psychiatric Admission Assessment Adult  Patient Identification: Joseph [name] MRN:  [id number] Date of Evaluation:  12/03/2014 Chief Complaint:  MDD Substance Use Disorder Principal Diagnosis: Alcohol-induced depressive disorder with moderate or severe use disorder with onset during intoxication Diagnosis:   Patient Active Problem List   Diagnosis Date Noted  . Alcohol use disorder, severe, dependence [F10.20] 12/03/2014  . 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 Cocaine use disorder, severe, dependence [F14.20] 12/03/2014  . Cannabis use disorder, severe, dependence [F12.20] 12/03/2014  . Abscess of right forearm [L02.413] 03/24/2012   History of Present Illness: Joseph [name], 34 yr old, presented to [facility] after verbalizing SI/HI towards family members.  He states that for months now he has been going through worsening depression  He "had been depressed for a long time since I turned 30.  I've been on Zoloft and Xanax."  It is unclear if he is seeing a mental health professional currently.  Additionally, he is unable to articulate well enough emotional triggers that led to this crisis event.  He mentioned that it was his daughter that made him think twice about hurting self and others.    He endorses depression and feelings of worthlessness.  He becomes agitated easily at simple things.  He admits to using alcohol, cocaine and THC on a regular basis.  He was seen at [facility] for detox treatment and he states this was helpful.  He was pleasant today.  Calm but did appear guarded and did not volunteer information.     Elements:  Location:  Suicidal ideation. Quality:  feelings of agitation and anger and depression. Timing:  in the last few months. Context:  see HPI. Associated Signs/Symptoms: Depression Symptoms:  depressed mood, fatigue, hopelessness, anxiety, (Hypo) Manic Symptoms:  Irritable  Mood, Anxiety Symptoms:  Excessive Worry, Social Anxiety, Psychotic Symptoms:  NA PTSD Symptoms: NA Total Time spent with patient: 30 minutes  Past Medical History:  Past Medical History  Diagnosis Date  . Narcotic abuse   . Asthma     as a child  . Depression   . Anxiety     Past Surgical History  Procedure Laterality Date  . I&d extremity  03/24/2012    Procedure: IRRIGATION AND DEBRIDEMENT EXTREMITY;  Surgeon: Marybelle Killings, MD;  Location: [place];  Service: Orthopedics;  Laterality: Right;  Right Forearm  . Closed reduction finger with percutaneous pinning Right 06/22/2013    Procedure: RIGHT HAND SMALL FINGER CLOSED MANIPULATION AND PINNING;  Surgeon: Linna Hoff, MD;  Location: [place];  Service: Orthopedics;  Laterality: Right;   Family History:  Family History  Problem Relation Age of Onset  . Other Other   . Alcohol abuse Other    Social History:  History  Alcohol Use  . 7.2 oz/week  . 12 Cans of beer per week     History  Drug Use  . Yes  . Special: Marijuana    Comment: hx of substance abuse    History   Social History  . Marital Status: Single    Spouse Name: N/A  . Number of Children: N/A  . Years of Education: N/A   Social History Main Topics  . Smoking status: Current Every Day Smoker -- 1.00 packs/day for 10 years    Types: Cigarettes  . Smokeless tobacco: Never Used  . Alcohol Use: 7.2 oz/week    12 Cans of beer per week  . Drug Use:  Yes    Special: Marijuana     Comment: hx of substance abuse  . Sexual Activity: Not on file   Other Topics Concern  . None   Social History Narrative   Additional Social History:    History of alcohol / drug use?: Yes Negative Consequences of Use: Personal relationships   Musculoskeletal: Strength & Muscle Tone: within normal limits Gait & Station: normal Patient leans: N/A  Psychiatric Specialty Exam: Physical Exam  Vitals reviewed. Psychiatric: His mood appears anxious.    Review of  Systems  Psychiatric/Behavioral: Positive for depression.  All other systems reviewed and are negative.   Blood pressure 116/71, pulse 93, temperature 99 F (37.2 C), temperature source Oral, resp. rate 17, height 5' 9" (1.753 m), weight 104.781 kg (231 lb).Body mass index is 34.1 kg/(m^2).   General Appearance: Casual   Eye Contact:: Good   Speech: Clear and Coherent   Volume: Normal   Mood: Anxious and Depressed   Affect: Appropriate   Thought Process: Coherent   Orientation: Full (Time, Place, and Person)   Thought Content: Rumination   Suicidal Thoughts: Yes. without intent/plan   Homicidal Thoughts: Yes. without intent/plan   Memory: Immediate; Fair  Recent; Fair  Remote; Fair   Judgement: Impaired   Insight: Lacking   Psychomotor Activity: Normal   Concentration: Fair   Recall: [employer] of Knowledge:Fair   Language: Fair   Akathisia: No   Handed: Right   AIMS (if indicated):   Assets: Communication Skills  Desire for Improvement   Sleep:   Cognition: WNL   ADL's: Intact     Risk to Self: Is patient at risk for suicide?: Yes What has been your use of drugs/alcohol within the last 12 months?: Has been using marijuana, cocaine powder, liquor and beer.  Uses them only when he gets really down.   Has been using over 10 years, something every day, not necessarily the same thing. Risk to Others:   Prior Inpatient Therapy:   Prior Outpatient Therapy:    Alcohol Screening: 1. How often do you have a drink containing alcohol?: 4 or more times a week 2. How many drinks containing alcohol do you have on a typical day when you are drinking?: 10 or more 3. How often do you have six or more drinks on one occasion?: Daily or almost daily Preliminary Score: 8 4. How often during the last year have you found that you were not able to stop drinking once you had started?: Weekly 5. How often during the last year have you failed to do what was normally expected from you becasue  of drinking?: Less than monthly 6. How often during the last year have you needed a first drink in the morning to get yourself going after a heavy drinking session?: Never 7. How often during the last year have you had a feeling of guilt of remorse after drinking?: Monthly 8. How often during the last year have you been unable to remember what happened the night before because you had been drinking?: Never 9. Have you or someone else been injured as a result of your drinking?: No 10. Has a relative or friend or a doctor or another health worker been concerned about your drinking or suggested you cut down?: Yes, during the last year Alcohol Use Disorder Identification Test Final Score (AUDIT): 22 Brief Intervention: Yes  Allergies:  No Known Allergies Lab Results:  Results for orders placed or performed during the hospital  encounter of 12/02/14 (from the past 48 hour(s))  Acetaminophen level     Status: Abnormal   Collection Time: 12/02/14  5:30 PM  Result Value Ref Range   Acetaminophen (Tylenol), Serum <10 (L) 10 - 30 ug/mL    Comment:        THERAPEUTIC CONCENTRATIONS VARY SIGNIFICANTLY. A RANGE OF 10-30 ug/mL MAY BE AN EFFECTIVE CONCENTRATION FOR MANY PATIENTS. HOWEVER, SOME ARE BEST TREATED AT CONCENTRATIONS OUTSIDE THIS RANGE. ACETAMINOPHEN CONCENTRATIONS >150 ug/mL AT 4 HOURS AFTER INGESTION AND >50 ug/mL AT 12 HOURS AFTER INGESTION ARE OFTEN ASSOCIATED WITH TOXIC REACTIONS.   CBC     Status: Abnormal   Collection Time: 12/02/14  5:30 PM  Result Value Ref Range   WBC 10.8 (H) 4.0 - 10.5 K/uL   RBC 4.50 4.22 - 5.81 MIL/uL   Hemoglobin 13.8 13.0 - 17.0 g/dL   HCT 41.4 39.0 - 52.0 %   MCV 92.0 78.0 - 100.0 fL   MCH 30.7 26.0 - 34.0 pg   MCHC 33.3 30.0 - 36.0 g/dL   RDW 12.3 11.5 - 15.5 %   Platelets 189 150 - 400 K/uL  Comprehensive metabolic panel     Status: Abnormal   Collection Time: 12/02/14  5:30 PM  Result Value Ref Range   Sodium 136 135 - 145 mmol/L    Potassium 3.5 3.5 - 5.1 mmol/L   Chloride 102 101 - 111 mmol/L   CO2 22 22 - 32 mmol/L   Glucose, Bld 93 65 - 99 mg/dL   BUN 10 6 - 20 mg/dL   Creatinine, Ser 1.09 0.61 - 1.24 mg/dL   Calcium 9.1 8.9 - 10.3 mg/dL   Total Protein 7.8 6.5 - 8.1 g/dL   Albumin 4.4 3.5 - 5.0 g/dL   AST 13 (L) 15 - 41 U/L   ALT 16 (L) 17 - 63 U/L   Alkaline Phosphatase 64 38 - 126 U/L   Total Bilirubin 1.2 0.3 - 1.2 mg/dL   GFR calc non Af Amer >60 >60 mL/min   GFR calc Af Amer >60 >60 mL/min    Comment: (NOTE) The eGFR has been calculated using the CKD EPI equation. This calculation has not been validated in all clinical situations. eGFR's persistently <60 mL/min signify possible Chronic Kidney Disease.    Anion gap 12 5 - 15  Ethanol (ETOH)     Status: Abnormal   Collection Time: 12/02/14  5:30 PM  Result Value Ref Range   Alcohol, Ethyl (B) 95 (H) <5 mg/dL    Comment:        LOWEST DETECTABLE LIMIT FOR SERUM ALCOHOL IS 5 mg/dL FOR MEDICAL PURPOSES ONLY   Salicylate level     Status: None   Collection Time: 12/02/14  5:30 PM  Result Value Ref Range   Salicylate Lvl <1.6 2.8 - 30.0 mg/dL   Current Medications: Current Facility-Administered Medications  Medication Dose Route Frequency Provider Last Rate Last Dose  . acetaminophen (TYLENOL) tablet 650 mg  650 mg Oral Q6H PRN Lurena Nida, NP      . alum & mag hydroxide-simeth (MAALOX/MYLANTA) 200-200-20 MG/5ML suspension 30 mL  30 mL Oral Q4H PRN Lurena Nida, NP      . chlordiazePOXIDE (LIBRIUM) capsule 25 mg  25 mg Oral Q6H PRN Ursula Alert, MD      . chlordiazePOXIDE (LIBRIUM) capsule 25 mg  25 mg Oral QID Ursula Alert, MD   25 mg at 12/03/14 1318   Followed by  . [  START ON 12/04/2014] chlordiazePOXIDE (LIBRIUM) capsule 25 mg  25 mg Oral TID Ursula Alert, MD       Followed by  . [START ON 12/05/2014] chlordiazePOXIDE (LIBRIUM) capsule 25 mg  25 mg Oral BH-qamhs Ursula Alert, MD       Followed by  . [START ON 12/06/2014]  chlordiazePOXIDE (LIBRIUM) capsule 25 mg  25 mg Oral Daily Saramma Eappen, MD      . hydrOXYzine (ATARAX/VISTARIL) tablet 25 mg  25 mg Oral Q6H PRN Ursula Alert, MD      . loperamide (IMODIUM) capsule 2-4 mg  2-4 mg Oral PRN Ursula Alert, MD      . magnesium hydroxide (MILK OF MAGNESIA) suspension 30 mL  30 mL Oral Daily PRN Lurena Nida, NP      . multivitamin with minerals tablet 1 tablet  1 tablet Oral Daily Ursula Alert, MD   1 tablet at 12/03/14 1318  . nicotine (NICODERM CQ - dosed in mg/24 hours) patch 21 mg  21 mg Transdermal Daily Nicholaus Bloom, MD      . ondansetron (ZOFRAN-ODT) disintegrating tablet 4 mg  4 mg Oral Q6H PRN Ursula Alert, MD      . sertraline (ZOLOFT) tablet 25 mg  25 mg Oral Daily Saramma Eappen, MD   25 mg at 12/03/14 1318  . [START ON 12/04/2014] thiamine (VITAMIN B-1) tablet 100 mg  100 mg Oral Daily Saramma Eappen, MD      . traZODone (DESYREL) tablet 50 mg  50 mg Oral QHS PRN Lurena Nida, NP   50 mg at 12/02/14 2216   PTA Medications: Prescriptions prior to admission  Medication Sig Dispense Refill Last Dose  . acetaminophen (TYLENOL) 500 MG tablet Take 1,000 mg by mouth every 6 (six) hours as needed for mild pain.    Past Month at Unknown time  . docusate sodium (COLACE) 100 MG capsule Take 1 capsule (100 mg total) by mouth 2 (two) times daily. (Patient not taking: Reported on 12/02/2014) 30 capsule 0 Not Taking at Unknown time    Previous Psychotropic Medications: Yes   Substance Abuse History in the last 12 months:  Yes.      Consequences of Substance Abuse: inpatient admissions  Results for orders placed or performed during the hospital encounter of 12/02/14 (from the past 72 hour(s))  Acetaminophen level     Status: Abnormal   Collection Time: 12/02/14  5:30 PM  Result Value Ref Range   Acetaminophen (Tylenol), Serum <10 (L) 10 - 30 ug/mL    Comment:        THERAPEUTIC CONCENTRATIONS VARY SIGNIFICANTLY. A RANGE OF 10-30 ug/mL MAY BE AN  EFFECTIVE CONCENTRATION FOR MANY PATIENTS. HOWEVER, SOME ARE BEST TREATED AT CONCENTRATIONS OUTSIDE THIS RANGE. ACETAMINOPHEN CONCENTRATIONS >150 ug/mL AT 4 HOURS AFTER INGESTION AND >50 ug/mL AT 12 HOURS AFTER INGESTION ARE OFTEN ASSOCIATED WITH TOXIC REACTIONS.   CBC     Status: Abnormal   Collection Time: 12/02/14  5:30 PM  Result Value Ref Range   WBC 10.8 (H) 4.0 - 10.5 K/uL   RBC 4.50 4.22 - 5.81 MIL/uL   Hemoglobin 13.8 13.0 - 17.0 g/dL   HCT 41.4 39.0 - 52.0 %   MCV 92.0 78.0 - 100.0 fL   MCH 30.7 26.0 - 34.0 pg   MCHC 33.3 30.0 - 36.0 g/dL   RDW 12.3 11.5 - 15.5 %   Platelets 189 150 - 400 K/uL  Comprehensive metabolic panel     Status:  Abnormal   Collection Time: 12/02/14  5:30 PM  Result Value Ref Range   Sodium 136 135 - 145 mmol/L   Potassium 3.5 3.5 - 5.1 mmol/L   Chloride 102 101 - 111 mmol/L   CO2 22 22 - 32 mmol/L   Glucose, Bld 93 65 - 99 mg/dL   BUN 10 6 - 20 mg/dL   Creatinine, Ser 1.09 0.61 - 1.24 mg/dL   Calcium 9.1 8.9 - 10.3 mg/dL   Total Protein 7.8 6.5 - 8.1 g/dL   Albumin 4.4 3.5 - 5.0 g/dL   AST 13 (L) 15 - 41 U/L   ALT 16 (L) 17 - 63 U/L   Alkaline Phosphatase 64 38 - 126 U/L   Total Bilirubin 1.2 0.3 - 1.2 mg/dL   GFR calc non Af Amer >60 >60 mL/min   GFR calc Af Amer >60 >60 mL/min    Comment: (NOTE) The eGFR has been calculated using the CKD EPI equation. This calculation has not been validated in all clinical situations. eGFR's persistently <60 mL/min signify possible Chronic Kidney Disease.    Anion gap 12 5 - 15  Ethanol (ETOH)     Status: Abnormal   Collection Time: 12/02/14  5:30 PM  Result Value Ref Range   Alcohol, Ethyl (B) 95 (H) <5 mg/dL    Comment:        LOWEST DETECTABLE LIMIT FOR SERUM ALCOHOL IS 5 mg/dL FOR MEDICAL PURPOSES ONLY   Salicylate level     Status: None   Collection Time: 12/02/14  5:30 PM  Result Value Ref Range   Salicylate Lvl <1.0 2.8 - 30.0 mg/dL    Observation Level/Precautions:  15 minute  checks  Laboratory:  per ED  Psychotherapy:  Group  Medications:  As per medlist  Consultations:  As needed  Discharge Concerns:  Safety  Estimated LOS: 2-7 days  Other:     Psychological Evaluations: Yes   Treatment Plan Summary: PLAN OF CARE: Patient will benefit from inpatient treatment and stabilization.  Estimated length of stay is 5-7 days.  Reviewed past medical records,treatment plan.   Will start Libirum /CIWA protocol for alcohol withdrawal sx. Will observe patient on the unit - for SI . Patient also is homicidal towards a cousin- will continue inpatient stay. Will provide Zoloft 25 mg po daily for depression , anxiety - this has worked in the past. Will provide Vistaril 25 mg po prn for breakthrough anxiety sx. Will start Trazodone 50 mg po qhs prn for sleep.  Will continue to monitor vitals ,medication compliance and treatment side effects while patient is here.  Will monitor for medical issues as well as call consult as needed.  Reviewed labs cbc,cmp,bal,uds- TSH - will be done tonight. CSW will start working on disposition.  Patient to participate in therapeutic milieu .    Medical Decision Making:  Review of Psycho-Social Stressors (1), Discuss test with performing physician (1), Review and summation of old records (2), Review of Medication Regimen & Side Effects (2) and Review of New Medication or Change in Dosage (2)  I certify that inpatient services furnished can reasonably be expected to improve the patient's condition.   Freda Munro May Agustin AGNP-BC 6/12/20162:18 PM

## 2014-12-04 DIAGNOSIS — F411 Generalized anxiety disorder: Secondary | ICD-10-CM | POA: Diagnosis present

## 2014-12-04 MED ORDER — BUSPIRONE HCL 10 MG PO TABS
10.0000 mg | ORAL_TABLET | Freq: Three times a day (TID) | ORAL | Status: DC
  Administered 2014-12-04 – 2014-12-06 (×8): 10 mg via ORAL
  Filled 2014-12-04: qty 1
  Filled 2014-12-04 (×2): qty 42
  Filled 2014-12-04 (×3): qty 1
  Filled 2014-12-04: qty 42
  Filled 2014-12-04 (×2): qty 1
  Filled 2014-12-04: qty 2
  Filled 2014-12-04 (×6): qty 1
  Filled 2014-12-04: qty 2

## 2014-12-04 NOTE — BHH Group Notes (Signed)
[facility] LCSW Aftercare Discharge Planning Group Note  12/04/2014 8:45 AM  Participation Quality: Alert, Appropriate and Oriented  Mood/Affect: Flat and Depressed  Depression Rating: 8  Anxiety Rating: 8  Thoughts of Suicide: Pt denies SI/HI  Will you contract for safety? Yes  Current AVH: Pt denies  Plan for Discharge/Comments: Pt attended discharge planning group and actively participated in group. CSW discussed suicide prevention education with the group and encouraged them to discuss discharge planning and any relevant barriers. Pt was observed to have his head in his hands but participated when prompted. Pt reports being depressed. He requested that CSW contact his employer to check on job status and confirm that he was in the hospital. No other needs identified at this time.   Transportation Means: Pt reports access to transportation  Supports: No supports mentioned at this time  Chad Cordial, Theresia Majors 12/04/2014 9:38 AM

## 2014-12-04 NOTE — Progress Notes (Signed)
[facility] MD Progress Note  12/04/2014 4:53 PM Joseph [name]  MRN:  [id number] Subjective:  Joseph [name] states he needs to get his life back together. He had a "bad opioid addiction" in the past that he was able to managed by going trough the Methadone clinic at [facility]. He was eventually able to go off. He is not dealing with his alcohol and cocaine use. He admits to a lot of anxiety. He is actually wanting to focus on himself. He has a job but states he probably lost it by now. He gets in a depressed mode where he starts thinking about hurting himself and he also projects the anger on other people and want to hurt them. Feels this is because of the frustration he feels with his own self. He is asking help with anxiety. He had been on Zoloft before Principal Problem: Alcohol-induced depressive disorder with moderate or severe use disorder with onset during intoxication Diagnosis:   Patient Active Problem List   Diagnosis Date Noted  . Alcohol use disorder, severe, dependence [F10.20] 12/03/2014  . 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 Cocaine use disorder, severe, dependence [F14.20] 12/03/2014  . Cannabis use disorder, severe, dependence [F12.20] 12/03/2014  . Abscess of right forearm [L02.413] 03/24/2012   Total Time spent with patient: 30 minutes   Past Medical History:  Past Medical History  Diagnosis Date  . Narcotic abuse   . Asthma     as a child  . Depression   . Anxiety     Past Surgical History  Procedure Laterality Date  . I&d extremity  03/24/2012    Procedure: IRRIGATION AND DEBRIDEMENT EXTREMITY;  Surgeon: Eldred Manges, MD;  Location: [facility] OR;  Service: Orthopedics;  Laterality: Right;  Right Forearm  . Closed reduction finger with percutaneous pinning Right 06/22/2013    Procedure: RIGHT HAND SMALL FINGER CLOSED MANIPULATION AND PINNING;  Surgeon: Sharma Covert, MD;  Location: [facility] OR;   Service: Orthopedics;  Laterality: Right;   Family History:  Family History  Problem Relation Age of Onset  . Other Other   . Alcohol abuse Other    Social History:  History  Alcohol Use  . 7.2 oz/week  . 12 Cans of beer per week     History  Drug Use  . Yes  . Special: Marijuana    Comment: hx of substance abuse    History   Social History  . Marital Status: Single    Spouse Name: N/A  . Number of Children: N/A  . Years of Education: N/A   Social History Main Topics  . Smoking status: Current Every Day Smoker -- 1.00 packs/day for 10 years    Types: Cigarettes  . Smokeless tobacco: Never Used  . Alcohol Use: 7.2 oz/week    12 Cans of beer per week  . Drug Use: Yes    Special: Marijuana     Comment: hx of substance abuse  . Sexual Activity: Not on file   Other Topics Concern  . None   Social History Narrative   Additional History:    Sleep: Fair  Appetite:  Fair   Assessment:   Musculoskeletal: Strength & Muscle Tone: within normal limits Gait & Station: normal Patient leans: normal   Psychiatric Specialty Exam: Physical Exam  Review of Systems  Constitutional: Positive for malaise/fatigue.  HENT: Negative.   Eyes: Negative.   Respiratory: Negative.   Cardiovascular: Negative.  Gastrointestinal: Negative.   Genitourinary: Negative.   Musculoskeletal: Negative.   Skin: Negative.   Neurological: Positive for weakness.  Endo/Heme/Allergies: Negative.   Psychiatric/Behavioral: Positive for depression and substance abuse. The patient is nervous/anxious.     Blood pressure 135/75, pulse 79, temperature 98.3 F (36.8 C), temperature source Oral, resp. rate 20, height  (1.753 m), weight 104.781 kg (231 lb).Body mass index is 34.1 kg/(m^2).  General Appearance: Fairly Groomed  Patent attorney::  Fair  Speech:  Clear and Coherent  Volume:  Decreased  Mood:  Anxious and Depressed  Affect:  Depressed and anxious worried  Thought Process:   Coherent and Goal Directed  Orientation:  Full (Time, Place, and Person)  Thought Content:  symptoms events worries concerns  Suicidal Thoughts:  not today  Homicidal Thoughts:  No  Memory:  Immediate;   Fair Recent;   Fair Remote;   Fair  Judgement:  Fair  Insight:  Present and Shallow  Psychomotor Activity:  Restlessness  Concentration:  Fair  Recall:  [employer] of Knowledge:Fair  Language: Fair  Akathisia:  No  Handed:  Right  AIMS (if indicated):     Assets:  Desire for Improvement  ADL's:  Intact  Cognition: WNL  Sleep:        Current Medications: Current Facility-Administered Medications  Medication Dose Route Frequency Provider Last Rate Last Dose  . acetaminophen (TYLENOL) tablet 650 mg  650 mg Oral Q6H PRN Kristeen Mans, NP      . alum & mag hydroxide-simeth (MAALOX/MYLANTA) 200-200-20 MG/5ML suspension 30 mL  30 mL Oral Q4H PRN Kristeen Mans, NP      . busPIRone (BUSPAR) tablet 10 mg  10 mg Oral TID Rachael Fee, MD   10 mg at 12/04/14 1302  . chlordiazePOXIDE (LIBRIUM) capsule 25 mg  25 mg Oral Q6H PRN Jomarie Longs, MD      . chlordiazePOXIDE (LIBRIUM) capsule 25 mg  25 mg Oral TID Jomarie Longs, MD   25 mg at 12/04/14 1300   Followed by  . [START ON 12/05/2014] chlordiazePOXIDE (LIBRIUM) capsule 25 mg  25 mg Oral BH-qamhs Jomarie Longs, MD       Followed by  . [START ON 12/06/2014] chlordiazePOXIDE (LIBRIUM) capsule 25 mg  25 mg Oral Daily Saramma Eappen, MD      . hydrOXYzine (ATARAX/VISTARIL) tablet 25 mg  25 mg Oral Q6H PRN Saramma Eappen, MD      . loperamide (IMODIUM) capsule 2-4 mg  2-4 mg Oral PRN Jomarie Longs, MD      . magnesium hydroxide (MILK OF MAGNESIA) suspension 30 mL  30 mL Oral Daily PRN Kristeen Mans, NP      . multivitamin with minerals tablet 1 tablet  1 tablet Oral Daily Jomarie Longs, MD   1 tablet at 12/04/14 0738  . neomycin-bacitracin-polymyxin (NEOSPORIN) ointment   Topical Daily Adonis Brook, NP      . nicotine (NICODERM CQ -  dosed in mg/24 hours) patch 21 mg  21 mg Transdermal Daily Rachael Fee, MD   21 mg at 12/04/14 0739  . ondansetron (ZOFRAN-ODT) disintegrating tablet 4 mg  4 mg Oral Q6H PRN Jomarie Longs, MD      . sertraline (ZOLOFT) tablet 25 mg  25 mg Oral Daily Jomarie Longs, MD   25 mg at 12/04/14 0738  . thiamine (VITAMIN B-1) tablet 100 mg  100 mg Oral Daily Jomarie Longs, MD   100 mg at 12/04/14 0738  .  traZODone (DESYREL) tablet 50 mg  50 mg Oral QHS PRN Kristeen Mans, NP   50 mg at 12/03/14 2206    Lab Results:  Results for orders placed or performed during the hospital encounter of 12/02/14 (from the past 48 hour(s))  TSH     Status: None   Collection Time: 12/03/14  7:16 PM  Result Value Ref Range   TSH 1.068 0.350 - 4.500 uIU/mL    Comment: Performed at [facility]    Physical Findings: AIMS: Facial and Oral Movements Muscles of Facial Expression: None, normal Lips and Perioral Area: None, normal Jaw: None, normal Tongue: None, normal,Extremity Movements Upper (arms, wrists, hands, fingers): None, normal Lower (legs, knees, ankles, toes): None, normal, Trunk Movements Neck, shoulders, hips: None, normal, Overall Severity Severity of abnormal movements (highest score from questions above): None, normal Incapacitation due to abnormal movements: None, normal Patient's awareness of abnormal movements (rate only patient's report): No Awareness, Dental Status Current problems with teeth and/or dentures?: No Does patient usually wear dentures?: No  CIWA:  CIWA-Ar Total: 2 COWS:     Treatment Plan Summary: Daily contact with patient to assess and evaluate symptoms and progress in treatment and Medication management Supportive approach/coping skills Alcohol dependence; Librium detox protocol Cocaine dependence; assess for mood instability from coming off the Cocaine Work a relapse prevention plan Depression; will continue the Zoloft increase to 50 mg daily Anxiety  will add Buspar 10 mg TID Will work with CBT/mindfulness  Explore residential treatment options Medical Decision Making:  Review of Psycho-Social Stressors (1), Review or order clinical lab tests (1), Review of Medication Regimen & Side Effects (2) and Review of New Medication or Change in Dosage (2)     Joseph [name] A 12/04/2014, 4:53 PM

## 2014-12-04 NOTE — Progress Notes (Signed)
Patient ID: Joseph [name], male   DOB: 08/10/1980, 34 y.o.   MRN: [id number] D: Client visible on the unit, watching TV, interacts appropriately with staff and peers. Client reports depression as "8" of 10 and anxiety at "9" of 10. "so much going on, thinking of one thing then another, but I got good people to talk to" "just want to make this work, get things straight" A: Writer introduced self to client, encouraged him to focus on recovery and hopefully the other things will begin to fall in place. Medications were reviewed and administered as prescribed. Staff will monitor q71min for safety. R: Client is safe on the unit, attended group.

## 2014-12-04 NOTE — Progress Notes (Signed)
Recreation Therapy Notes  Date: 06.13.16 Time: 9:30 am Location: [address] Group Room  Group Topic: Stress Management  Goal Area(s) Addresses:  Patient will verbalize importance of using healthy stress management.  Patient will identify positive emotions associated with healthy stress management.   Intervention: Stress Management  Activity : Guided Imagery. LRT introduced and educated patients on stress management technique of guided imagery. A script was used to deliver the techniques to patients. Patients were asked to follow script real aloud by LRT to engage in practicing guided imagery.  Education: Stress Management, Discharge Planning.   Clinical Observations/Feedback: Patient did not attend group.    Voncile Schwarz, LRT/CTRS        Lynnie Koehler A 12/04/2014 1:26 PM 

## 2014-12-04 NOTE — Plan of Care (Signed)
Problem: Diagnosis: Increased Risk For Suicide Attempt Goal: LTG-Patient Will Report Absence of Withdrawal Symptoms LTG (by discharge): Patient will report absence of withdrawal symptoms.  Outcome: Progressing Pt denies withdrawal, no signs or symptoms noted

## 2014-12-04 NOTE — [facility] Group Notes (Signed)
[facility] LCSW Group Therapy  12/04/2014 1:15pm  Type of Therapy:  Group Therapy vercoming Obstacles  Participation Level:  Active  Participation Quality:  Appropriate   Affect:  Appropriate  Cognitive:  Appropriate and Oriented  Insight:  Developing/Improving and Improving  Engagement in Therapy:  Improving  Modes of Intervention:  Discussion, Exploration, Problem-solving and Support  Description of Group:   In this group patients will be encouraged to explore what they see as obstacles to their own wellness and recovery. They will be guided to discuss their thoughts, feelings, and behaviors related to these obstacles. The group will process together ways to cope with barriers, with attention given to specific choices patients can make. Each patient will be challenged to identify changes they are motivated to make in order to overcome their obstacles. This group will be process-oriented, with patients participating in exploration of their own experiences as well as giving and receiving support and challenge from other group members.  Summary of Patient Progress: Pt engaged in group discussion, interacting with peers and participating willingly. Pt identified his addiction has his main obstacle at this time, reporting that it keeps him from finding and maintaining long-term employment. Pt identified fear and worry that he feels as he faces an obstacle, particularly addiction. Pt describes feeling anxious and less confident that he will be able to maintain sobriety. He describes a need to change negative habits at discharge such as finding a better support group and avoiding people, places, and things that trigger and encourage his addiction.    Therapeutic Modalities:   Cognitive Behavioral Therapy Solution Focused Therapy Motivational Interviewing Relapse Prevention Therapy   Chad Cordial, LCSWA 12/04/2014 2:58 PM

## 2014-12-04 NOTE — Progress Notes (Signed)
D: Pt's mood is depressed. He rates his depression 9/10, hopelessness 9/10, and anxiety 10/10. He denies SI/HI/AVH. He states that his goal for today is to get through today and be more social. A: Support given. Verbalization encouraged. Pt encouraged to come to staff for any concerns. Medications given as prescribed. R: Pt is receptive. No complaints of pain or discomfort at this time. Q15 min safety checks maintained. Pt remains safe on the unit. Will continue to monitor.

## 2014-12-05 MED ORDER — SERTRALINE HCL 50 MG PO TABS
50.0000 mg | ORAL_TABLET | Freq: Every day | ORAL | Status: DC
  Administered 2014-12-06: 50 mg via ORAL
  Filled 2014-12-05 (×2): qty 1
  Filled 2014-12-05: qty 14
  Filled 2014-12-05: qty 1

## 2014-12-05 NOTE — Progress Notes (Signed)
Patient ID: Joseph [name], male   DOB: 10/05/1980, 34 y.o.   MRN: [id number] D: Client has visitors today, reports supportive family. 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 "he changed on the job, He's a suck up, kisses ass"he's all right when he's not there" "I don't understand how family can treat you like that" "I don't want step to him because he is ten years older than me and I'm not a little boy anymore"  "cause if step to him he'll get upset and I don't know what might happen, then my aunt will be upset with me, so I'm just going to leave it alone" Client reports he wants to go to long term treatment "so I can just deal with everything at one time" A: Writer provided emotional support encouraged client to move forward, noting that he needs coping skills so can learn to channel his anger so it doesn't destroy him or relationships. Staff will monitor q62min for safety. R: Client is safe on the unit, attends group.

## 2014-12-05 NOTE — Progress Notes (Signed)
D:  Per pt self inventory pt reports sleeping fair, appetite fair, energy level low, ability to pay attention poor, rates depression at a 9 out of 10, hopelessness at a 9 out of 10, anxiety at a 9 out of 10, denies SI/HI/AVH, goal today: "Getting through today and being social", flat/depressed, anxious at times, complains of some mild withdrawal s/s, see CIWA assessment flowsheet.  A:  Emotional support provided, Encouraged pt to continue with treatment plan and attend all group activities, q15 min checks maintained for safety.  R:  Pt is receptive, going to groups but had trouble paying attention in am group--pt states that he was very sleepy this morning, complains of some anxiety and is receiving prn medication, see MAR, pleasant and cooperative with staff and other patients on the unit.

## 2014-12-05 NOTE — [facility] Group Notes (Signed)
Adult Psychoeducational Group Note  Date:  12/05/2014 Time:  0900 am  Group Topic/Focus:  Goals Group:   The focus of this group is to help patients establish daily goals to achieve during treatment and discuss how the patient can incorporate goal setting into their daily lives to aide in recovery. Orientation:   The focus of this group is to educate the patient on the purpose and policies of crisis stabilization and provide a format to answer questions about their admission.  The group details unit policies and expectations of patients while admitted.  Participation Level:  Minimal  Participation Quality:  Drowsy  Affect:  Flat  Cognitive:  Appropriate  Insight: Appropriate  Engagement in Group:  Lacking  Modes of Intervention:  Discussion, Education, Orientation, Rapport Building and Support  Additional Comments:  Pt was present during group but had a hard time staying awake and being attentive.  Alfonse Spruce 12/05/2014, 1:31 PM

## 2014-12-05 NOTE — [facility] Suicide Risk Assessment (Signed)
[facility] INPATIENT:  Family/Significant Other Suicide Prevention Education  Suicide Prevention Education:  Education Completed; Joseph [name], Pt's mother, has been identified by the patient as the family member/significant other with whom the patient will be residing, and identified as the person(s) who will aid the patient in the event of a mental health crisis (suicidal ideations/suicide attempt).  With written consent from the patient, the family member/significant other has been provided the following suicide prevention education, prior to the and/or following the discharge of the patient.  The suicide prevention education provided includes the following:  Suicide risk factors  Suicide prevention and interventions  National Suicide Hotline telephone number  [facility] assessment telephone number  [facility] Emergency Assistance 911  [facility] and/or Residential Mobile Crisis Unit telephone number  Request made of family/significant other to:  Remove weapons (e.g., guns, rifles, knives), all items previously/currently identified as safety concern.    Remove drugs/medications (over-the-counter, prescriptions, illicit drugs), all items previously/currently identified as a safety concern.  The family member/significant other verbalizes understanding of the suicide prevention education information provided.  The family member/significant other agrees to remove the items of safety concern listed above.  Joseph [name] 12/05/2014, 11:51 AM

## 2014-12-05 NOTE — Tx Team (Signed)
Interdisciplinary Treatment Plan Update (Adult) Date: 12/05/2014   Date: 12/05/2014 8:28 AM  Progress in Treatment:  Attending groups: Yes  Participating in groups: Yes  Taking medication as prescribed: Yes  Tolerating medication: Yes  Family/Significant othe contact made: Yes, with mother Patient understands diagnosis: Yes, AEB his ability to discuss his diagnosis and willingness to seek treatment. Discussing patient identified problems/goals with staff: Yes  Medical problems stabilized or resolved: Yes  Denies suicidal/homicidal ideation: Yes Patient has not harmed self or Others: Yes   New problem(s) identified: None identified at this time.   Discharge Plan or Barriers: Pt is undecided on treatment at this time as he is uncertain whether he will be able to return to his job upon discharge. Pt will return to live with his grandmother. MD continues to monitor medication and make changes as necessary.  Additional comments:  Joseph [name] is a 34yo male hospitalized for SI, depression, alcohol abuse/detox. He lives with great-grandmother, does not indicate interest in rehab but at this point not sure what to do, has never been to rehab. Has no current psychiatrist or therapy, is interested in referrals.   Reason for Continuation of Hospitalization:  Depression Medication stabilization Suicidal ideation Withdrawal symptoms  Estimated length of stay: 3-5 days  For review of initial/current patient goals, please see plan of care.    Attendees:  Patient:    Family:    Physician: Dr. Dub Mikes MD  12/05/2014 8:20 AM  Nursing: Onnie Boer, RN Case manager  12/05/2014 8:20 AM  Clinical Social Worker Lamar Sprinkles, MSW 12/05/2014 8:20 AM  Other: Leisa Lenz, Vesta Mixer Liasion 12/05/2014 8:20 AM  Clinical:  Keitha Butte, RN; Quintella Reichert, RN 12/05/2014 8:20 AM  Other: , RN Charge Nurse 12/05/2014 8:20 AM  Other:      Chad Cordial, Theresia Majors MSW

## 2014-12-05 NOTE — [facility] Group Notes (Addendum)
[facility] Mental Health Association Group Therapy 12/05/2014 1:15pm  Type of Therapy: Mental Health Association Presentation  Participation Level: Minimal  Participation Quality: Lethargic  Affect: Lethargic  Cognitive: Oriented  Insight: Developing/Improving  Engagement in Therapy: Limited  Modes of Intervention: Discussion, Education and Socialization  Summary of Progress/Problems: Mental Health Association (MHA) Speaker came to talk about his personal journey with substance abuse and addiction. The pt processed ways by which to relate to the speaker. MHA speaker provided handouts and educational information pertaining to groups and services offered by the [facility].  Pt was observed to be lethargic during group session and struggled to stay awake. He was, however, receptive to resources provided by speaker.  Chad Cordial, LCSWA 12/05/2014 1:28 PM

## 2014-12-05 NOTE — Progress Notes (Signed)
Recreation Therapy Notes  Animal-Assisted Activity (AAA) Program Checklist/Progress Notes Patient Eligibility Criteria Checklist & Daily Group note for Rec Tx Intervention  Date: 06.14.16 Time: 2:30 pm Location: [address]   AAA/T Program Assumption of Risk Form signed by Patient/ or Parent Legal Guardian yes  Patient is free of allergies or sever asthma yes  Patient reports no fear of animals yes  Patient reports no history of cruelty to animals yes  Patient understands his/her participation is voluntary yes  Patient washes hands before animal contact yes  Patient washes hands after animal contact yes  Education: Hand Washing, Appropriate Animal Interaction   Education Outcome: Acknowledges understanding/In group clarification offered/Needs additional education.   Clinical Observations/Feedback:  Patient did not attend group.   Caroll Rancher, LRT/CTRS         Caroll Rancher A 12/05/2014 3:58 PM

## 2014-12-05 NOTE — Progress Notes (Signed)
[facility] MD Progress Note  12/05/2014 8:13 PM Joseph [name]  MRN:  [id number] Subjective:  Joseph [name] is worried about his job. States he called his employer and she was not able to assure him that he was going to have a job when he came back. Does not know what to do. States he really needs further help and probably needs to go to rehab, but if he does not have a job it is going to be very stressful. Worried for a possible relapse Principal Problem: Alcohol-induced depressive disorder with moderate or severe use disorder with onset during intoxication Diagnosis:   Patient Active Problem List   Diagnosis Date Noted  . GAD (generalized anxiety disorder) [F41.1] 12/04/2014  . Alcohol use disorder, severe, dependence [F10.20] 12/03/2014  . 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 Cocaine use disorder, severe, dependence [F14.20] 12/03/2014  . Cannabis use disorder, severe, dependence [F12.20] 12/03/2014  . Abscess of right forearm [L02.413] 03/24/2012   Total Time spent with patient: 30 minutes   Past Medical History:  Past Medical History  Diagnosis Date  . Narcotic abuse   . Asthma     as a child  . Depression   . Anxiety     Past Surgical History  Procedure Laterality Date  . I&d extremity  03/24/2012    Procedure: IRRIGATION AND DEBRIDEMENT EXTREMITY;  Surgeon: Joseph Manges, MD;  Location: [facility] OR;  Service: Orthopedics;  Laterality: Right;  Right Forearm  . Closed reduction finger with percutaneous pinning Right 06/22/2013    Procedure: RIGHT HAND SMALL FINGER CLOSED MANIPULATION AND PINNING;  Surgeon: Joseph Covert, MD;  Location: [facility] OR;  Service: Orthopedics;  Laterality: Right;   Family History:  Family History  Problem Relation Age of Onset  . Other Other   . Alcohol abuse Other    Social History:  History  Alcohol Use  . 7.2 oz/week  . 12 Cans of beer per week     History  Drug Use   . Yes  . Special: Marijuana    Comment: hx of substance abuse    History   Social History  . Marital Status: Single    Spouse Name: N/A  . Number of Children: N/A  . Years of Education: N/A   Social History Main Topics  . Smoking status: Current Every Day Smoker -- 1.00 packs/day for 10 years    Types: Cigarettes  . Smokeless tobacco: Never Used  . Alcohol Use: 7.2 oz/week    12 Cans of beer per week  . Drug Use: Yes    Special: Marijuana     Comment: hx of substance abuse  . Sexual Activity: Not on file   Other Topics Concern  . None   Social History Narrative   Additional History:    Sleep: Fair  Appetite:  Fair   Assessment:   Musculoskeletal: Strength & Muscle Tone: within normal limits Gait & Station: normal Patient leans: normally   Psychiatric Specialty Exam: Physical Exam  Review of Systems  Constitutional: Negative.   HENT: Negative.   Eyes: Negative.   Respiratory: Negative.   Cardiovascular: Negative.   Gastrointestinal: Negative.   Genitourinary: Negative.   Musculoskeletal: Negative.   Skin: Negative.   Neurological: Negative.   Endo/Heme/Allergies: Negative.   Psychiatric/Behavioral: Positive for depression and substance abuse. The patient is nervous/anxious.     Blood pressure 126/77, pulse 84, temperature 98.4 F (36.9 C), temperature source  Oral, resp. rate 16, height  (1.753 m), weight 104.781 kg (231 lb).Body mass index is 34.1 kg/(m^2).  General Appearance: Fairly Groomed  Patent attorney::  Fair  Speech:  Clear and Coherent  Volume:  Decreased  Mood:  Anxious and Depressed  Affect:  anxious worried  Thought Process:  Coherent and Goal Directed  Orientation:  Full (Time, Place, and Person)  Thought Content:  symptoms events worries concerns  Suicidal Thoughts:  No  Homicidal Thoughts:  No  Memory:  Immediate;   Fair Recent;   Fair Remote;   Fair  Judgement:  Fair  Insight:  Present  Psychomotor Activity:  Restlessness   Concentration:  Fair  Recall:  [employer] of Knowledge:Fair  Language: Fair  Akathisia:  No  Handed:  Right  AIMS (if indicated):     Assets:  Desire for Improvement  ADL's:  Intact  Cognition: WNL  Sleep:  Number of Hours: 6     Current Medications: Current Facility-Administered Medications  Medication Dose Route Frequency Provider Last Rate Last Dose  . acetaminophen (TYLENOL) tablet 650 mg  650 mg Oral Q6H PRN Joseph Mans, NP      . alum & mag hydroxide-simeth (MAALOX/MYLANTA) 200-200-20 MG/5ML suspension 30 mL  30 mL Oral Q4H PRN Joseph Mans, NP      . busPIRone (BUSPAR) tablet 10 mg  10 mg Oral TID Joseph Fee, MD   10 mg at 12/05/14 1636  . chlordiazePOXIDE (LIBRIUM) capsule 25 mg  25 mg Oral Q6H PRN Joseph Longs, MD      . chlordiazePOXIDE (LIBRIUM) capsule 25 mg  25 mg Oral BH-qamhs Joseph Eappen, MD   25 mg at 12/05/14 0904   Followed by  . [START ON 12/06/2014] chlordiazePOXIDE (LIBRIUM) capsule 25 mg  25 mg Oral Daily Joseph Eappen, MD      . hydrOXYzine (ATARAX/VISTARIL) tablet 25 mg  25 mg Oral Q6H PRN Joseph Longs, MD   25 mg at 12/05/14 1158  . loperamide (IMODIUM) capsule 2-4 mg  2-4 mg Oral PRN Joseph Longs, MD      . magnesium hydroxide (MILK OF MAGNESIA) suspension 30 mL  30 mL Oral Daily PRN Joseph Mans, NP      . multivitamin with minerals tablet 1 tablet  1 tablet Oral Daily Joseph Longs, MD   1 tablet at 12/05/14 0903  . neomycin-bacitracin-polymyxin (NEOSPORIN) ointment   Topical Daily Joseph Brook, NP      . nicotine (NICODERM CQ - dosed in mg/24 hours) patch 21 mg  21 mg Transdermal Daily Joseph Fee, MD   21 mg at 12/05/14 [id number]  . ondansetron (ZOFRAN-ODT) disintegrating tablet 4 mg  4 mg Oral Q6H PRN Joseph Longs, MD      . Melene Muller ON 12/06/2014] sertraline (ZOLOFT) tablet 50 mg  50 mg Oral Daily Joseph Fee, MD      . thiamine (VITAMIN B-1) tablet 100 mg  100 mg Oral Daily Joseph Longs, MD   100 mg at 12/05/14 0903  . traZODone  (DESYREL) tablet 50 mg  50 mg Oral QHS PRN Joseph Mans, NP   50 mg at 12/04/14 2232    Lab Results: No results found for this or any previous visit (from the past 48 hour(s)).  Physical Findings: AIMS: Facial and Oral Movements Muscles of Facial Expression: None, normal Lips and Perioral Area: None, normal Jaw: None, normal Tongue: None, normal,Extremity Movements Upper (arms, wrists, hands, fingers): None, normal Lower (  legs, knees, ankles, toes): None, normal, Trunk Movements Neck, shoulders, hips: None, normal, Overall Severity Severity of abnormal movements (highest score from questions above): None, normal Incapacitation due to abnormal movements: None, normal Patient's awareness of abnormal movements (rate only patient's report): No Awareness, Dental Status Current problems with teeth and/or dentures?: No Does patient usually wear dentures?: No  CIWA:  CIWA-Ar Total: 1 COWS:     Treatment Plan Summary: Daily contact with patient to assess and evaluate symptoms and progress in treatment and Medication management Supportive approach/coping skills/problem solving/ stress management Alcohol dependence/cocaine abuse; complete the Librium detox protocol Depression; increase the Zoloft to 50 mg Anxiety continue Buspar at 10 mg TID Relapse prevention plan Explore residential treatment options  Medical Decision Making:  Review of Psycho-Social Stressors (1), Review of Medication Regimen & Side Effects (2) and Review of New Medication or Change in Dosage (2)     Shoshana Johal A 12/05/2014, 8:13 PM

## 2014-12-06 ENCOUNTER — Encounter (HOSPITAL_COMMUNITY): Payer: Self-pay | Admitting: Registered Nurse

## 2014-12-06 MED ORDER — BACITRACIN-NEOMYCIN-POLYMYXIN OINTMENT TUBE: 1.0000 "application " | TOPICAL_OINTMENT | Freq: Every day | CUTANEOUS | Status: DC

## 2014-12-06 MED ORDER — TRAZODONE HCL 50 MG PO TABS: 50.0000 mg | ORAL_TABLET | Freq: Every evening | ORAL | Status: DC | PRN

## 2014-12-06 MED ORDER — SERTRALINE HCL 50 MG PO TABS: 50.0000 mg | ORAL_TABLET | Freq: Every day | ORAL | Status: DC

## 2014-12-06 MED ORDER — BUSPIRONE HCL 10 MG PO TABS: 10.0000 mg | ORAL_TABLET | Freq: Three times a day (TID) | ORAL | Status: DC

## 2014-12-06 MED ORDER — HYDROXYZINE HCL 25 MG PO TABS: 25.0000 mg | ORAL_TABLET | Freq: Three times a day (TID) | ORAL | Status: DC | PRN

## 2014-12-06 MED ORDER — HYDROXYZINE HCL 25 MG PO TABS
25.0000 mg | ORAL_TABLET | Freq: Three times a day (TID) | ORAL | Status: DC | PRN
  Filled 2014-12-06: qty 20

## 2014-12-06 MED ORDER — ADULT MULTIVITAMIN W/MINERALS CH: 1.0000 | ORAL_TABLET | Freq: Every day | ORAL | Status: DC

## 2014-12-06 NOTE — Progress Notes (Signed)
  [facility] Adult Case Management Discharge Plan :  Will you be returning to the same living situation after discharge:  Yes,  will return to grandmother's houe At discharge, do you have transportation home?: Yes,  mother to provide transportation Do you have the ability to pay for your medications: Yes,  Pt provided with samples and 30-day prescriptions  Release of information consent forms completed and in the chart;  Patient's signature needed at discharge.  Patient to Follow up at: Follow-up Information    Follow up with [facility] On 12/11/2014.   Why:  atn 8:00am for your initial screening. Please bring your Social Security card and photo ID with a [facility] address.    Contact information:   [address] [place], [place] [address] [phone]      Patient denies SI/HI: Yes,  Pt denies    Safety Planning and Suicide Prevention discussed: Yes,  with mother. See SPE for further details  Have you used any form of tobacco in the last 30 days? (Cigarettes, Smokeless Tobacco, Cigars, and/or Pipes): Yes  Has patient been referred to the Quitline?: Patient refused referral  Elaina Hoops 12/06/2014, 11:59 AM

## 2014-12-06 NOTE — [facility] Group Notes (Signed)
[facility] LCSW Group Therapy 12/06/2014 1:15 PM  Type of Therapy: Group Therapy- Emotion Regulation  Participation Level: Minimal     Participation Quality:  Lethargic  Affect: Lethargic  Cognitive: Alert and Oriented   Insight:  Developing/Improving  Engagement in Therapy: Developing/Improving and Engaged   Modes of Intervention: Clarification, Confrontation, Discussion, Education, Exploration, Limit-setting, Orientation, Problem-solving, Rapport Building, Dance movement psychotherapist, Socialization and Support  Summary of Progress/Problems: The topic for group today was emotional regulation. This group focused on both positive and negative emotion identification and allowed group members to process ways to identify feelings, regulate negative emotions, and find healthy ways to manage internal/external emotions. Group members were asked to reflect on a time when their reaction to an emotion led to a negative outcome and explored how alternative responses using emotion regulation would have benefited them. Group members were also asked to discuss a time when emotion regulation was utilized when a negative emotion was experienced. Pt was observed to be lethargic during group, struggling to stay awake. Pt was active at the beginning of the group identifying drug abuse as his "go to" emotion regulation tactic; however he recognizes the detrimental effect that drug abuse has had on his life.    Joseph [name], LCSWA 12/06/2014 1:55 PM

## 2014-12-06 NOTE — Plan of Care (Signed)
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 Client acknowledges that anger issues, along with substance abuse issues has to be addressed. He is considering long term treatment

## 2014-12-06 NOTE — Progress Notes (Signed)
DIS - CHARGE  NOTE  ---   Discharge pt as ordered.  All prescriptions were provided and explained.  Pt. Agreed to attend all out-pt. Appointments for medication management.  All possessions were returned to pt.  Pt. Was happy and smiling at DC and denied SI/HI/HA/ or pain.  Pt. Agreed to contract for safety and to stay safe after DC.   Sample /take home medications were provided to the pt. --- A --- escort pt. To front lobby where his ride home was waiting at 1735 Hrs.,  12/06/14  --- R ---  Pt. Was safe at time of DC

## 2014-12-06 NOTE — BHH Group Notes (Signed)
[facility] LCSW Aftercare Discharge Planning Group Note  12/06/2014 8:45 AM  Participation Quality: Alert, Appropriate and Oriented  Mood/Affect: Appropriate  Depression Rating: 5  Anxiety Rating: 5  Thoughts of Suicide: Pt denies SI/HI  Will you contract for safety? Yes  Current AVH: Pt denies  Plan for Discharge/Comments: Pt attended discharge planning group and actively participated in group. CSW discussed suicide prevention education with the group and encouraged them to discuss discharge planning and any relevant barriers. Pt reports much improved mood and verbalized he believes that his medications are working well. Pt is agreeable to screening at [facility]. He will return to his grandmother's house at discharge. Mother will provide transportation.  Transportation Means: Pt reports access to transportation  Supports: Mother, grandmother  Chad Cordial, Theresia Majors 12/06/2014 9:45 AM

## 2014-12-06 NOTE — Progress Notes (Signed)
Recreation Therapy Notes  Date: 06.15.16 Time: 9:30 am Location: 300 Hall Group Room  Group Topic: Stress Management  Goal Area(s) Addresses:  Patient will verbalize importance of using healthy stress management.  Patient will identify positive emotions associated with healthy stress management.   Intervention: Stress Management  Activity :  Guided Imagery.  LRT introduced and educated patients on stress management technique of guided imagery.  A script was used to deliver the technique to patients.  Patients were asked to follow the script read aloud by LRT to engage in practicing the technique.  Education:  Stress Management, Discharge Planning.   Clinical Observations/Feedback: Patient did not attend group.  Caroll Rancher, LRT/CTRS         Lillia Abed, Staphanie Harbison A 12/06/2014 2:08 PM

## 2014-12-06 NOTE — BHH Suicide Risk Assessment (Signed)
[facility] Discharge Suicide Risk Assessment   Demographic Factors:  Male  Total Time spent with patient: 30 minutes  Musculoskeletal: Strength & Muscle Tone: within normal limits Gait & Station: normal Patient leans: N/A  Psychiatric Specialty Exam: Physical Exam  Review of Systems  Constitutional: Negative.   HENT: Negative.   Eyes: Negative.   Respiratory: Negative.   Cardiovascular: Negative.   Gastrointestinal: Negative.   Genitourinary: Negative.   Musculoskeletal: Negative.   Skin: Negative.   Endo/Heme/Allergies: Negative.   Psychiatric/Behavioral: Positive for substance abuse. The patient is nervous/anxious.     Blood pressure 121/94, pulse 101, temperature 98.5 F (36.9 C), temperature source Oral, resp. rate 16, height 5\' 9"  (1.753 m), weight 104.781 kg (231 lb).Body mass index is 34.1 kg/(m^2).  General Appearance: Fairly Groomed  Patent attorney::  Fair  Speech:  Clear and Coherent409  Volume:  Normal  Mood:  Anxious  Affect:  Appropriate  Thought Process:  Coherent and Goal Directed  Orientation:  Full (Time, Place, and Person)  Thought Content:  plans as he moves on, relapse prevention plan  Suicidal Thoughts:  No  Homicidal Thoughts:  No  Memory:  Immediate;   Fair Recent;   Fair Remote;   Fair  Judgement:  Fair  Insight:  Present  Psychomotor Activity:  Normal  Concentration:  Fair  Recall:  [employer] of Knowledge:Fair  Language: Fair  Akathisia:  No  Handed:  Right  AIMS (if indicated):     Assets:  Desire for Improvement  Sleep:  Number of Hours: 6  Cognition: WNL  ADL's:  Intact   Have you used any form of tobacco in the last 30 days? (Cigarettes, Smokeless Tobacco, Cigars, and/or Pipes): Yes  Has this patient used any form of tobacco in the last 30 days? (Cigarettes, Smokeless Tobacco, Cigars, and/or Pipes) Yes, A prescription for an FDA-approved tobacco cessation medication was offered at discharge and the patient refused  Mental Status Per  Nursing Assessment::   On Admission:  Suicidal ideation indicated by patient, Suicidal ideation indicated by others, Self-harm thoughts  Current Mental Status by Physician: In full contact with reality. His mood is euthymic his affect is appropriate. There are no active S/S of withdrawal. There are no active SI plans or intent. He is going to have a screening appointment at [facility] next week. Meanwhile he is going to meet with his employer and see where he stands with them. If he was to have a job, he might prioritize working and going to meetings in [place] of going to a residential treatment program   Loss Factors: NA  Historical Factors: NA  Risk Reduction Factors:   Sense of responsibility to family, Employed, Living with another person, especially a relative and Positive social support  Continued Clinical Symptoms:  Severe Anxiety and/or Agitation Alcohol/Substance Abuse/Dependencies  Cognitive Features That Contribute To Risk:  Closed-mindedness, Polarized thinking and Thought constriction (tunnel vision)    Suicide Risk:  Minimal: No identifiable suicidal ideation.  Patients presenting with no risk factors but with morbid ruminations; may be classified as minimal risk based on the severity of the depressive symptoms  Principal Problem: Alcohol-induced depressive disorder with moderate or severe use disorder with onset during intoxication Discharge Diagnoses:  Patient Active Problem List   Diagnosis Date Noted  . GAD (generalized anxiety disorder) [F41.1] 12/04/2014  . Alcohol use disorder, severe, dependence [F10.20] 12/03/2014  . 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  Cocaine use disorder, severe, dependence [F14.20] 12/03/2014  . Cannabis use disorder, severe, dependence [F12.20] 12/03/2014  . Abscess of right forearm [L02.413] 03/24/2012    Follow-up Information    Follow up with  [facility] On 12/11/2014.   Why:  atn 8:00am for your initial screening. Please bring your Social Security card and photo ID with a [facility] address.    Contact information:   [address] [place], [place] [address] Phone:([phone]      Plan Of Care/Follow-up recommendations:  Activity:  as tolerated Diet:  regular Follow up [facility] as above Is patient on multiple antipsychotic therapies at discharge:  No   Has Patient had three or more failed trials of antipsychotic monotherapy by history:  No  Recommended Plan for Multiple Antipsychotic Therapies: NA    Jolissa Kapral A 12/06/2014, 1:39 PM

## 2014-12-06 NOTE — Discharge Summary (Signed)
Physician Discharge Summary Note  Patient:  Joseph [name] is an 34 y.o., male MRN:  [id number] DOB:  1980-09-26 Patient phone:  [phone] (home)  Patient address:   [address] [place] [address],  Total Time spent with patient: Greater than 30 minutes  Date of Admission:  12/02/2014 Date of Discharge: 12/06/2014  Reason for Admission:  Per H&P Admission:  Joseph [name], 34 yr old, presented to [facility] after verbalizing SI/HI towards family members. He states that for months now he has been going through worsening depression He "had been depressed for a long time since I turned 30. I've been on Zoloft and Xanax." It is unclear if he is seeing a mental health professional currently. Additionally, he is unable to articulate well enough emotional triggers that led to this crisis event. He mentioned that it was his daughter that made him think twice about hurting self and others.   He endorses depression and feelings of worthlessness. He becomes agitated easily at simple things. He admits to using alcohol, cocaine and THC on a regular basis. He was seen at [facility] for detox treatment and he states this was helpful. He was pleasant today. Calm but did appear guarded and did not volunteer information.   Principal Problem: Alcohol-induced depressive disorder with moderate or severe use disorder with onset during intoxication Discharge Diagnoses: Patient Active Problem List   Diagnosis Date Noted  . GAD (generalized anxiety disorder) [F41.1] 12/04/2014  . Alcohol use disorder, severe, dependence [F10.20] 12/03/2014  . 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 Cocaine use disorder, severe, dependence [F14.20] 12/03/2014  . Cannabis use disorder, severe, dependence [F12.20] 12/03/2014  . Abscess of right forearm [L02.413] 03/24/2012    Musculoskeletal: Strength & Muscle Tone: within  normal limits Gait & Station: normal Patient leans: N/A  Psychiatric Specialty Exam:  See Suicide Risk Assessment Physical Exam  Nursing note and vitals reviewed. Constitutional: He is oriented to person, place, and time.  Neck: Normal range of motion.  Respiratory: Effort normal.  Musculoskeletal: Normal range of motion.  Neurological: He is alert and oriented to person, place, and time.    Review of Systems  Psychiatric/Behavioral: Negative for suicidal ideas and hallucinations. Depression: Stable. Nervous/anxious: Stable. Insomnia: Stable.   All other systems reviewed and are negative.   Blood pressure 121/94, pulse 101, temperature 98.5 F (36.9 C), temperature source Oral, resp. rate 16, height  (1.753 m), weight 104.781 kg (231 lb).Body mass index is 34.1 kg/(m^2).  Have you used any form of tobacco in the last 30 days? (Cigarettes, Smokeless Tobacco, Cigars, and/or Pipes): Yes  Has this patient used any form of tobacco in the last 30 days? (Cigarettes, Smokeless Tobacco, Cigars, and/or Pipes) Yes, A prescription for an FDA-approved tobacco cessation medication was offered at discharge and the patient refused  Past Medical History:  Past Medical History  Diagnosis Date  . Narcotic abuse   . Asthma     as a child  . Depression   . Anxiety     Past Surgical History  Procedure Laterality Date  . I&d extremity  03/24/2012    Procedure: IRRIGATION AND DEBRIDEMENT EXTREMITY;  Surgeon: Eldred Manges, MD;  Location: [facility];  Service: Orthopedics;  Laterality: Right;  Right Forearm  . Closed reduction finger with percutaneous pinning Right 06/22/2013    Procedure: RIGHT HAND SMALL FINGER CLOSED MANIPULATION AND PINNING;  Surgeon: Sharma Covert, MD;  Location:  [facility] OR;  Service: Orthopedics;  Laterality: Right;   Family History:  Family History  Problem Relation Age of Onset  . Other Other   . Alcohol abuse Other    Social History:  History  Alcohol Use  . 7.2 oz/week  .  12 Cans of beer per week     History  Drug Use  . Yes  . Special: Marijuana    Comment: hx of substance abuse    History   Social History  . Marital Status: Single    Spouse Name: N/A  . Number of Children: N/A  . Years of Education: N/A   Social History Main Topics  . Smoking status: Current Every Day Smoker -- 1.00 packs/day for 10 years    Types: Cigarettes  . Smokeless tobacco: Never Used  . Alcohol Use: 7.2 oz/week    12 Cans of beer per week  . Drug Use: Yes    Special: Marijuana     Comment: hx of substance abuse  . Sexual Activity: Not on file   Other Topics Concern  . None   Social History Narrative   Risk to Self: Is patient at risk for suicide?: Yes What has been your use of drugs/alcohol within the last 12 months?: Has been using marijuana, cocaine powder, liquor and beer.  Uses them only when he gets really down.   Has been using over 10 years, something every day, not necessarily the same thing. Risk to Others:   Prior Inpatient Therapy:   Prior Outpatient Therapy:    Level of Care:  OP  Hospital Course:  Joseph [name] was admitted for Alcohol-induced depressive disorder with moderate or severe use disorder with onset during intoxication and crisis management.  She was treated discharged with the medications listed below under Medication List.  Medical problems were identified and treated as needed.  Home medications were restarted as appropriate.  Improvement was monitored by observation and Joseph [name] daily report of symptom reduction.  Emotional and mental status was monitored by daily self-inventory reports completed by Joseph [name] and clinical staff.         Joseph [name] was evaluated by the treatment team for stability and plans for continued recovery upon discharge.  Joseph [name] motivation was an integral factor for scheduling further treatment.  Employment, transportation, bed availability, health status, family support,  and any pending legal issues were also considered during her hospital stay.  She was offered further treatment options upon discharge including but not limited to Residential, Intensive Outpatient, and Outpatient treatment.  Joseph [name] will follow up with the services as listed below under Follow Up Information.     Upon completion of this admission the patient was both mentally and medically stable for discharge denying suicidal/homicidal ideation, auditory/visual/tactile hallucinations, delusional thoughts and paranoia.      Consults:  psychiatry  Significant Diagnostic Studies:  labs: UDS, ETOH, CBC, CMET  Discharge Vitals:   Blood pressure 121/94, pulse 101, temperature 98.5 F (36.9 C), temperature source Oral, resp. rate 16, height  (1.753 m), weight 104.781 kg (231 lb). Body mass index is 34.1 kg/(m^2). Lab Results:   Results for orders placed or performed during the hospital encounter of 12/02/14 (from the past 72 hour(s))  TSH     Status: None   Collection Time: 12/03/14  7:16 PM  Result Value Ref Range   TSH 1.068 0.350 - 4.500 uIU/mL    Comment: Performed at [facility]    Physical Findings: AIMS: Facial and Oral Movements Muscles of Facial Expression: None, normal Lips and Perioral Area: None, normal Jaw: None, normal Tongue: None, normal,Extremity Movements Upper (arms, wrists, hands, fingers): None, normal Lower (legs, knees, ankles, toes): None, normal, Trunk Movements Neck, shoulders, hips: None, normal, Overall Severity Severity of abnormal movements (highest score from questions above): None, normal Incapacitation due to abnormal movements: None, normal Patient's awareness of abnormal movements (rate only patient's report): No Awareness, Dental Status Current problems with teeth and/or dentures?: No Does patient usually wear dentures?: No  CIWA:  CIWA-Ar Total: 6 COWS:      See Psychiatric Specialty Exam and Suicide Risk  Assessment completed by Attending Physician prior to discharge.  Discharge destination:  Home  Is patient on multiple antipsychotic therapies at discharge:  No   Has Patient had three or more failed trials of antipsychotic monotherapy by history:  No    Recommended Plan for Multiple Antipsychotic Therapies: NA      Discharge Instructions    Activity as tolerated - No restrictions    Complete by:  As directed      Diet general    Complete by:  As directed      Discharge instructions    Complete by:  As directed   Take all of you medications as prescribed by your mental healthcare provider.  Report any adverse effects and reactions from your medications to your outpatient provider promptly. Do not engage in alcohol and or illegal drug use while on prescription medicines. In the event of worsening symptoms call the crisis hotline, 911, and or go to the nearest emergency department for appropriate evaluation and treatment of symptoms. Follow-up with your primary care provider for your medical issues, concerns and or health care needs.   Keep all scheduled appointments.  If you are unable to keep an appointment call to reschedule.  Let the nurse know if you will need medications before next scheduled appointment.            Medication List    TAKE these medications      Indication   acetaminophen 500 MG tablet  Commonly known as:  TYLENOL  Take 1,000 mg by mouth every 6 (six) hours as needed for mild pain.      busPIRone 10 MG tablet  Commonly known as:  BUSPAR  Take 1 tablet (10 mg total) by mouth 3 (three) times daily.   Indication:  Generalized Anxiety Disorder     docusate sodium 100 MG capsule  Commonly known as:  COLACE  Take 1 capsule (100 mg total) by mouth 2 (two) times daily.   Indication:  Constipation     hydrOXYzine 25 MG tablet  Commonly known as:  ATARAX/VISTARIL  Take 1 tablet (25 mg total) by mouth 3 (three) times daily as needed for anxiety.    Indication:  Anxiety     multivitamin with minerals Tabs tablet  Take 1 tablet by mouth daily.   Indication:  Nutritional Support     neomycin-bacitracin-polymyxin Oint  Commonly known as:  NEOSPORIN  Apply 1 application topically daily.   Indication:  Skin Discomfort     sertraline 50 MG tablet  Commonly known as:  ZOLOFT  Take 1 tablet (50 mg total) by mouth daily.   Indication:  Major Depressive Disorder     traZODone 50 MG tablet  Commonly known as:  DESYREL  Take 1 tablet (50 mg total) by mouth at bedtime as  needed for sleep.   Indication:  Trouble Sleeping       Follow-up Information    Follow up with [facility] On 12/11/2014.   Why:  atn 8:00am for your initial screening. Please bring your Social Security card and photo ID with a [facility] address.    Contact information:   [address] [place], [place] [address] [phone]      Follow-up recommendations:  Activity:  As tolerated Diet:  As tolerated  Comments:   Patient has been instructed to take medications as prescribed; and report adverse effects to outpatient provider.  Follow up with primary doctor for any medical issues and If symptoms recur report to nearest emergency or crisis hot line.    Total Discharge Time: greater than 30 minutes  Signed: Assunta Found, FNP-BC 12/06/2014, 4:31 PM   I personally assessed the patient and formulated the plan Madie Reno A. Dub Mikes, M.D.

## 2014-12-06 NOTE — Progress Notes (Signed)
Pt attended the evening NA meeting and remained in group until it ended.   Nelly Scriven, MHT 

## 2014-12-06 NOTE — Progress Notes (Signed)
Patient ID: Joseph [name], male   DOB: 04/16/1981, 34 y.o.   MRN: [id number] D  ---  Pt. Is app/coop with staff but complains of withdrawal anxiety.  He is pleasant and interacts well with peers in dayroom.  Pt. Agrees to contract for safety.  He rates his anxiety at a   7 " for today , but  He is controlling it well.  His gaol is to just get through the day.  == A ---  Support and encouragement provided.  -- R --  Pt. Remain safe on unit

## 2018-03-31 ENCOUNTER — Emergency Department (HOSPITAL_COMMUNITY): Payer: Self-pay

## 2018-03-31 ENCOUNTER — Encounter (HOSPITAL_COMMUNITY): Payer: Self-pay | Admitting: *Deleted

## 2018-03-31 ENCOUNTER — Inpatient Hospital Stay (HOSPITAL_COMMUNITY)
Admission: EM | Admit: 2018-03-31 | Discharge: 2018-04-02 | DRG: 580 | Disposition: A | Discharge status: Home / Self Care | Payer: Self-pay | Attending: Family Medicine | Admitting: Family Medicine

## 2018-03-31 ENCOUNTER — Other Ambulatory Visit: Payer: Self-pay

## 2018-03-31 DIAGNOSIS — Z811 Family history of alcohol abuse and dependence: Secondary | ICD-10-CM

## 2018-03-31 DIAGNOSIS — F411 Generalized anxiety disorder: Secondary | ICD-10-CM | POA: Diagnosis present

## 2018-03-31 DIAGNOSIS — F1110 Opioid abuse, uncomplicated: Secondary | ICD-10-CM | POA: Diagnosis present

## 2018-03-31 DIAGNOSIS — D696 Thrombocytopenia, unspecified: Secondary | ICD-10-CM

## 2018-03-31 DIAGNOSIS — F1510 Other stimulant abuse, uncomplicated: Secondary | ICD-10-CM | POA: Diagnosis present

## 2018-03-31 DIAGNOSIS — Z79899 Other long term (current) drug therapy: Secondary | ICD-10-CM

## 2018-03-31 DIAGNOSIS — L0291 Cutaneous abscess, unspecified: Secondary | ICD-10-CM | POA: Insufficient documentation

## 2018-03-31 DIAGNOSIS — L02519 Cutaneous abscess of unspecified hand: Secondary | ICD-10-CM | POA: Diagnosis present

## 2018-03-31 DIAGNOSIS — F329 Major depressive disorder, single episode, unspecified: Secondary | ICD-10-CM | POA: Diagnosis present

## 2018-03-31 DIAGNOSIS — M795 Residual foreign body in soft tissue: Secondary | ICD-10-CM

## 2018-03-31 DIAGNOSIS — L02413 Cutaneous abscess of right upper limb: Secondary | ICD-10-CM | POA: Diagnosis present

## 2018-03-31 DIAGNOSIS — F32A Depression, unspecified: Secondary | ICD-10-CM

## 2018-03-31 DIAGNOSIS — F17210 Nicotine dependence, cigarettes, uncomplicated: Secondary | ICD-10-CM | POA: Diagnosis present

## 2018-03-31 DIAGNOSIS — L02511 Cutaneous abscess of right hand: Principal | ICD-10-CM

## 2018-03-31 LAB — COMPREHENSIVE METABOLIC PANEL
ALT: 12 U/L (ref 0–44)
ANION GAP: 6 (ref 5–15)
AST: 12 U/L — ABNORMAL LOW (ref 15–41)
Albumin: 3.4 g/dL — ABNORMAL LOW (ref 3.5–5.0)
Alkaline Phosphatase: 74 U/L (ref 38–126)
BILIRUBIN TOTAL: 0.9 mg/dL (ref 0.3–1.2)
BUN: 5 mg/dL — ABNORMAL LOW (ref 6–20)
CO2: 29 mmol/L (ref 22–32)
Calcium: 9.2 mg/dL (ref 8.9–10.3)
Chloride: 104 mmol/L (ref 98–111)
Creatinine, Ser: 0.9 mg/dL (ref 0.61–1.24)
GFR calc non Af Amer: >60 mL/min (ref >60)
Glucose, Bld: 110 mg/dL — ABNORMAL HIGH (ref 70–99)
Potassium: 3.8 mmol/L (ref 3.5–5.1)
Sodium: 139 mmol/L (ref 135–145)
TOTAL PROTEIN: 7.7 g/dL (ref 6.5–8.1)

## 2018-03-31 LAB — CBC
HCT: 45.4 % (ref 39.0–52.0)
Hemoglobin: 14.3 g/dL (ref 13.0–17.0)
MCH: 29.3 pg (ref 26.0–34.0)
MCHC: 31.5 g/dL (ref 30.0–36.0)
MCV: 93 fL (ref 80.0–100.0)
NRBC: 0 % (ref 0.0–0.2)
PLATELETS: 115 10*3/uL — AB (ref 150–400)
RBC: 4.88 MIL/uL (ref 4.22–5.81)
RDW: 11.9 % (ref 11.5–15.5)
WBC: 13.4 10*3/uL — AB (ref 4.0–10.5)

## 2018-03-31 LAB — I-STAT CG4 LACTIC ACID, ED: LACTIC ACID, VENOUS: 1.3 mmol/L (ref 0.5–1.9)

## 2018-03-31 MED ORDER — VANCOMYCIN HCL IN DEXTROSE 1-5 GM/200ML-% IV SOLN
1000.0000 mg | Freq: Three times a day (TID) | INTRAVENOUS | Status: DC
  Administered 2018-04-01 – 2018-04-02 (×4): 1000 mg via INTRAVENOUS
  Filled 2018-03-31 (×4): qty 200

## 2018-03-31 MED ORDER — VANCOMYCIN HCL 10 G IV SOLR
2000.0000 mg | Freq: Once | INTRAVENOUS | Status: AC
  Administered 2018-03-31: 2000 mg via INTRAVENOUS
  Filled 2018-03-31: qty 2000

## 2018-03-31 MED ORDER — LIDOCAINE HCL (PF) 1 % IJ SOLN
5.0000 mL | Freq: Once | INTRAMUSCULAR | Status: AC
  Administered 2018-03-31: 5 mL
  Filled 2018-03-31: qty 5

## 2018-03-31 MED ORDER — ENOXAPARIN SODIUM 40 MG/0.4ML ~~LOC~~ SOLN
40.0000 mg | SUBCUTANEOUS | Status: DC
  Administered 2018-04-01: 40 mg via SUBCUTANEOUS
  Filled 2018-03-31 (×2): qty 0.4

## 2018-03-31 MED ORDER — ACETAMINOPHEN 325 MG PO TABS: 650.0000 mg | ORAL_TABLET | Freq: Four times a day (QID) | ORAL | Status: DC | PRN

## 2018-03-31 MED ORDER — KETOROLAC TROMETHAMINE 30 MG/ML IJ SOLN
30.0000 mg | Freq: Four times a day (QID) | INTRAMUSCULAR | Status: DC | PRN
  Administered 2018-04-01 (×3): 30 mg via INTRAVENOUS
  Filled 2018-03-31 (×4): qty 1

## 2018-03-31 MED ORDER — ACETAMINOPHEN 650 MG RE SUPP: 650.0000 mg | Freq: Four times a day (QID) | RECTAL | Status: DC | PRN

## 2018-03-31 NOTE — H&P (Signed)
History and Physical    Joseph [name] OZH:[id number] DOB: Nov 07, 1980 DOA: 03/31/2018  PCP: Patient, No Pcp Per Patient coming from: Home  Chief Complaint: Swollen right hand and elbow  HPI: Joseph [name] is a 37 y.o. male with medical history significant of IV drug use, asthma, depression, anxiety presenting to the hospital for further evaluation of swollen right arm and elbow.  Patient states he has been using IV drugs for several months.  He injects methamphetamine; last used a week ago.  States for the past 2 days he has noticed that his right hand and area next to the right elbow felt sore, tight.  The swelling got progressively worse which led him to come into the hospital for evaluation.  He denies having any fevers, chills, nausea, or vomiting.  At present, states his pain is 9 out of 10 in intensity.  He denies having any numbness or tingling in his right arm or forearm.  States he has an upcoming appointment with a counselor to speak about his substance abuse.  ED Course: Vitals stable on arrival.  Afebrile.  White count 13.4.  Lactic acid normal.  X-ray of right hand showing soft tissue swelling without acute bony abnormality; needle fragments seen.  X-ray of right elbow/ forearm showing soft tissue swelling lateral to the elbow suspicious for subcutaneous abscess; needle fragments seen.  Patient was given IV vancomycin in the ED. Case was discussed with hand surgeon Dr. Melvyn Novas who recommended I&D in the ED, wound culture, admission for IV antibiotics, and wound care consult. [facility] paged to admit.  Review of Systems: As per HPI otherwise 10 point review of systems negative.  Past Medical History:  Diagnosis Date  . Anxiety   . Asthma    as a child  . Depression   . Narcotic abuse [facility])     Past Surgical History:  Procedure Laterality Date  . CLOSED REDUCTION FINGER WITH PERCUTANEOUS PINNING Right 06/22/2013   Procedure: RIGHT HAND SMALL FINGER CLOSED MANIPULATION AND  PINNING;  Surgeon: Sharma Covert, MD;  Location: [facility] OR;  Service: Orthopedics;  Laterality: Right;  . I&D EXTREMITY  03/24/2012   Procedure: IRRIGATION AND DEBRIDEMENT EXTREMITY;  Surgeon: Eldred Manges, MD;  Location: [facility] OR;  Service: Orthopedics;  Laterality: Right;  Right Forearm     reports that he has been smoking cigarettes. He has a 10.00 pack-year smoking history. He has never used smokeless tobacco. He reports that he drinks about 12.0 standard drinks of alcohol per week. He reports that he has current or past drug history. Drug: Marijuana.  No Known Allergies  Family History  Problem Relation Age of Onset  . Other Other   . Alcohol abuse Other     Prior to Admission medications   Medication Sig Start Date End Date Taking? Authorizing Provider  acetaminophen (TYLENOL) 500 MG tablet Take 1,000 mg by mouth every 6 (six) hours as needed for mild pain.    Yes [provider]  busPIRone (BUSPAR) 10 MG tablet Take 1 tablet (10 mg total) by mouth 3 (three) times daily. Patient not taking: Reported on 03/31/2018 12/06/14   Rankin, Shuvon B, NP  docusate sodium (COLACE) 100 MG capsule Take 1 capsule (100 mg total) by mouth 2 (two) times daily. Patient not taking: Reported on 12/02/2014 06/22/13   Bradly Bienenstock, MD  hydrOXYzine (ATARAX/VISTARIL) 25 MG tablet Take 1 tablet (25 mg total) by mouth 3 (three) times daily as needed for anxiety. Patient not taking:  Reported on 03/31/2018 12/06/14   Rankin, Shuvon B, NP  Multiple Vitamin (MULTIVITAMIN WITH MINERALS) TABS tablet Take 1 tablet by mouth daily. Patient not taking: Reported on 03/31/2018 12/06/14   Rankin, Shuvon B, NP  neomycin-bacitracin-polymyxin (NEOSPORIN) OINT Apply 1 application topically daily. Patient not taking: Reported on 03/31/2018 12/06/14   Rankin, Shuvon B, NP  sertraline (ZOLOFT) 50 MG tablet Take 1 tablet (50 mg total) by mouth daily. Patient not taking: Reported on 03/31/2018 12/06/14   Rankin, Shuvon B, NP  traZODone  (DESYREL) 50 MG tablet Take 1 tablet (50 mg total) by mouth at bedtime as needed for sleep. Patient not taking: Reported on 03/31/2018 12/06/14   Assunta Found B, NP    Physical Exam: Vitals:   03/31/18 1517 03/31/18 1747 03/31/18 2017 03/31/18 2019  BP: 136/74 125/82 126/80 126/80  Pulse: 97 94 (!) 101 (!) 102  Resp: 18 16 18 18   Temp: 98.3 F (36.8 C) 98.6 F (37 C)  97.9 F (36.6 C)  TempSrc:  Oral  Oral  SpO2: 98% 100% 98% 99%  Weight: 90.7 kg     Height: 5\' 10"  (1.778 m)      Physical Exam  Constitutional: He is oriented to person, place, and time. He appears well-developed and well-nourished. No distress.  HENT:  Head: Normocephalic and atraumatic.  Mouth/Throat: Oropharynx is clear and moist.  Eyes: Right eye exhibits no discharge. Left eye exhibits no discharge.  Neck: Neck supple. No tracheal deviation present.  Cardiovascular: Normal rate, regular rhythm and intact distal pulses.  Pulmonary/Chest: Effort normal and breath sounds normal. No respiratory distress.  Abdominal: Soft. Bowel sounds are normal. He exhibits no distension. There is no tenderness.  Musculoskeletal:  Right forearm abscess noted next to the elbow.  Status post I&D in the ED. Right hand abscess noted.  Status post I&D in the ED.  Dorsum of the right hand appears swollen. Radial pulse +2 in the right hand.  Nailbed capillary refill time normal.   Neurological: He is alert and oriented to person, place, and time.  Skin: Skin is warm and dry.     Labs on Admission: I have personally reviewed following labs and imaging studies  CBC: Recent Labs  Lab 03/31/18 1532  WBC 13.4*  HGB 14.3  HCT 45.4  MCV 93.0  PLT 115*   Basic Metabolic Panel: Recent Labs  Lab 03/31/18 1532  NA 139  K 3.8  CL 104  CO2 29  GLUCOSE 110*  BUN 5*  CREATININE 0.90  CALCIUM 9.2   GFR: Estimated Creatinine Clearance: 127.3 mL/min (by C-G formula based on SCr of 0.9 mg/dL). Liver Function Tests: Recent  Labs  Lab 03/31/18 1532  AST 12*  ALT 12  ALKPHOS 74  BILITOT 0.9  PROT 7.7  ALBUMIN 3.4*   No results for input(s): LIPASE, AMYLASE in the last 168 hours. No results for input(s): AMMONIA in the last 168 hours. Coagulation Profile: No results for input(s): INR, PROTIME in the last 168 hours. Cardiac Enzymes: No results for input(s): CKTOTAL, CKMB, CKMBINDEX, TROPONINI in the last 168 hours. BNP (last 3 results) No results for input(s): PROBNP in the last 8760 hours. HbA1C: No results for input(s): HGBA1C in the last 72 hours. CBG: No results for input(s): GLUCAP in the last 168 hours. Lipid Profile: No results for input(s): CHOL, HDL, LDLCALC, TRIG, CHOLHDL, LDLDIRECT in the last 72 hours. Thyroid Function Tests: No results for input(s): TSH, T4TOTAL, FREET4, T3FREE, THYROIDAB in the last 72  hours. Anemia Panel: No results for input(s): VITAMINB12, FOLATE, FERRITIN, TIBC, IRON, RETICCTPCT in the last 72 hours. Urine analysis:    Component Value Date/Time   COLORURINE AMBER (A) 04/23/2012 1842   APPEARANCEUR CLEAR 04/23/2012 1842   LABSPEC 1.031 (H) 04/23/2012 1842   PHURINE 5.5 04/23/2012 1842   GLUCOSEU NEGATIVE 04/23/2012 1842   HGBUR NEGATIVE 04/23/2012 1842   BILIRUBINUR SMALL (A) 04/23/2012 1842   KETONESUR NEGATIVE 04/23/2012 1842   PROTEINUR NEGATIVE 04/23/2012 1842   UROBILINOGEN 1.0 04/23/2012 1842   NITRITE NEGATIVE 04/23/2012 1842   LEUKOCYTESUR NEGATIVE 04/23/2012 1842    Radiological Exams on Admission: Dg Elbow Complete Right  Result Date: 03/31/2018 CLINICAL DATA:  Right elbow abscess, IV drug abuse skip EXAM: RIGHT ELBOW - COMPLETE 3+ VIEW COMPARISON:  None FINDINGS: Once again there are at least 15 needle fragments in the antecubital in proximal forearm region. Abnormal focal cutaneous and subcutaneous soft tissue swelling medial to the elbow suspicious for potential abscess/infection. No findings of osteomyelitis or elbow effusion. IMPRESSION:  Abnormal soft tissue swelling lateral to the elbow suspicious for subcutaneous abscess. There are about 15 needle fragments around the elbow. Electronically Signed   By: Gaylyn Rong M.D.   On: 03/31/2018 22:39   Dg Forearm Right  Result Date: 03/31/2018 CLINICAL DATA:  Abscesses.  IV drug user. EXAM: RIGHT FOREARM - 2 VIEW COMPARISON:  None. FINDINGS: I count about 16 linear foreign bodies projecting over the antecubital/elbow region and proximal forearm compatible needle fragments. A similar needle fragment projects over the base of the thumb. Focal soft tissue swelling lateral to the elbow possibly from localized subcutaneous infection. No elbow joint effusion is identified. IMPRESSION: 1. About 16 linear foreign bodies compatible with needle fragments are present along the antecubital region and proximal forearm with another needle fragment near the base of the thumb. 2. Soft tissue swelling especially lateral to the elbow suspicious for infection. 3. No bony destructive findings or elbow joint effusion. Electronically Signed   By: Gaylyn Rong M.D.   On: 03/31/2018 22:38   Dg Hand Complete Right  Result Date: 03/31/2018 CLINICAL DATA:  History of IV drug abuse with pain and swelling in the right hand near the base of the thumb. EXAM: RIGHT HAND - COMPLETE 3+ VIEW COMPARISON:  06/06/2013 FINDINGS: Previously seen fifth metacarpal fracture has healed. There are multiple radiopaque linear densities identified within the soft tissues of the hand consistent with needles. This is consistent with the given clinical history. No acute fracture or dislocation is noted. Significant soft tissue swelling is noted particularly between the first and second metacarpals likely related to the radiopaque foreign bodies. IMPRESSION: Considerable soft tissue swelling without acute bony abnormality. Multiple radiopaque foreign bodies are noted in the soft tissues in the area of clinical concern consistent with  needle fragments. This likely contributes to the underlying abnormality. Electronically Signed   By: Alcide Clever M.D.   On: 03/31/2018 16:43    Assessment/Plan Principal Problem:   Abscess of right hand Active Problems:   Abscess of right forearm   GAD (generalized anxiety disorder)   Thrombocytopenia (HCC)   Depression   Right hand/forearm abscesses Hemodynamically stable. Afebrile.  White count 13.4.  Lactic acid normal.  X-ray of right hand showing soft tissue swelling without acute bony abnormality; needle fragments seen.  X-ray of right elbow/ forearm showing soft tissue swelling lateral to the elbow suspicious for subcutaneous abscess; needle fragments seen.  Patient was given IV vancomycin in the  ED. Case was discussed with hand surgeon Dr. Melvyn Novas who recommended I&D in the ED, wound culture, admission for IV antibiotics, and wound care consult.  He advised to call him if the patient does not respond to these treatments and will require further incision and drainage in the operating room.   -At the time of my examination, radial pulse +2 and nailbed capillary refill time normal in the right hand.  Patient denied having any numbness or tingling.  No signs of neurovascular compromise. I advised the patient to let nursing staff know right away if he experiences any numbness, tingling, or worsening pain in his right hand or forearm as that could be a sign of neurovascular compromise. -I&D done by ED provider -Continue IV vancomycin -Blood culture x2 pending -Wound culture pending -Consult wound care -CBC in a.m. -Tylenol PRN -IV Toradol as needed -Keep n.p.o. overnight  Thrombocytopenia Platelets 115,000; were normal 3 years ago.  No recent baseline. -Repeat CBC in a.m.  Anxiety and depression -BuSpar, Zoloft, and trazodone listed as home medication.  I spoke to the patient and he informed me that he has not taken these medications in a very long time.  He currently does not take  any medications.    DVT prophylaxis: Lovenox Code Status: Patient wishes to be full code. Family Communication: No family present at bedside. Disposition Plan: Anticipate discharge in 1 to 2 days to home. consults called: Hand surgery (Dr. Melvyn Novas), wound care Admission status: Observation   John Giovanni MD [facility] Pager [phone]  If 7PM-7AM, please contact night-coverage [email or web address] Password [email or web address]  03/31/2018, 11:39 PM

## 2018-03-31 NOTE — ED Provider Notes (Signed)
[facility] EMERGENCY DEPARTMENT Provider Note   CSN: [id number] Arrival date & time: 03/31/18  1447     History   Chief Complaint Chief Complaint  Patient presents with  . Hand Pain  . Abscess    HPI Joseph [name] is a 37 y.o. male.  Patient presents to the ED with right hand swelling, right proximal forearm swelling.  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  Denies any recent injections in the right hand but does inject from the right proximal forearm.  Has injected in the right hand in the past.  Denies any fever, chills.  States that the right proximal forearm has been draining purulent fluid for the last several days.  Pain in the right hand got worse over the last day and a half.  The history is provided by the patient.  Hand Pain  This is a new problem. The current episode started 2 days ago. The problem occurs constantly. The problem has been gradually worsening. Pertinent negatives include no chest pain, no abdominal pain, no headaches and no shortness of breath. Nothing aggravates the symptoms. Nothing relieves the symptoms. He has tried nothing for the symptoms. The treatment provided no relief.    Past Medical History:  Diagnosis Date  . Anxiety   . Asthma    as a child  . Depression   . Narcotic abuse [facility])     Patient Active Problem List   Diagnosis Date Noted  . Abscess 03/31/2018  . Thrombocytopenia (HCC) 03/31/2018  . Depression 03/31/2018  . Abscess of hand 03/31/2018  . Abscess of right hand 03/31/2018  . GAD (generalized anxiety disorder) 12/04/2014  . Alcohol use disorder, severe, dependence (HCC) 12/03/2014  . 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 Cocaine use disorder, severe, dependence (HCC) 12/03/2014  . Cannabis use disorder, severe, dependence (HCC) 12/03/2014  . Abscess of right forearm 03/24/2012    Past Surgical  History:  Procedure Laterality Date  . CLOSED REDUCTION FINGER WITH PERCUTANEOUS PINNING Right 06/22/2013   Procedure: RIGHT HAND SMALL FINGER CLOSED MANIPULATION AND PINNING;  Surgeon: Sharma Covert, MD;  Location: [facility] OR;  Service: Orthopedics;  Laterality: Right;  . I&D EXTREMITY  03/24/2012   Procedure: IRRIGATION AND DEBRIDEMENT EXTREMITY;  Surgeon: Eldred Manges, MD;  Location: [facility] OR;  Service: Orthopedics;  Laterality: Right;  Right Forearm        Home Medications    Prior to Admission medications   Medication Sig Start Date End Date Taking? Authorizing Provider  acetaminophen (TYLENOL) 500 MG tablet Take 1,000 mg by mouth every 6 (six) hours as needed for mild pain.    Yes [provider]  busPIRone (BUSPAR) 10 MG tablet Take 1 tablet (10 mg total) by mouth 3 (three) times daily. Patient not taking: Reported on 03/31/2018 12/06/14   Rankin, Shuvon B, NP  docusate sodium (COLACE) 100 MG capsule Take 1 capsule (100 mg total) by mouth 2 (two) times daily. Patient not taking: Reported on 12/02/2014 06/22/13   Bradly Bienenstock, MD  hydrOXYzine (ATARAX/VISTARIL) 25 MG tablet Take 1 tablet (25 mg total) by mouth 3 (three) times daily as needed for anxiety. Patient not taking: Reported on 03/31/2018 12/06/14   Rankin, Shuvon B, NP  Multiple Vitamin (MULTIVITAMIN WITH MINERALS) TABS tablet Take 1 tablet by mouth daily. Patient not taking: Reported on 03/31/2018 12/06/14   Rankin, Denice Bors B, NP  neomycin-bacitracin-polymyxin (  NEOSPORIN) OINT Apply 1 application topically daily. Patient not taking: Reported on 03/31/2018 12/06/14   Rankin, Shuvon B, NP  sertraline (ZOLOFT) 50 MG tablet Take 1 tablet (50 mg total) by mouth daily. Patient not taking: Reported on 03/31/2018 12/06/14   Rankin, Shuvon B, NP  traZODone (DESYREL) 50 MG tablet Take 1 tablet (50 mg total) by mouth at bedtime as needed for sleep. Patient not taking: Reported on 03/31/2018 12/06/14   Rankin, Denice Bors B, NP    Family  History Family History  Problem Relation Age of Onset  . Other Other   . Alcohol abuse Other     Social History Social History   Tobacco Use  . Smoking status: Current Every Day Smoker    Packs/day: 1.00    Years: 10.00    Pack years: 10.00    Types: Cigarettes  . Smokeless tobacco: Never Used  Substance Use Topics  . Alcohol use: Yes    Alcohol/week: 12.0 standard drinks    Types: 12 Cans of beer per week  . Drug use: Yes    Types: Marijuana    Comment: hx of substance abuse     Allergies   Patient has no known allergies.   Review of Systems Review of Systems  Constitutional: Negative for chills and fever.  HENT: Negative for ear pain and sore throat.   Eyes: Negative for pain and visual disturbance.  Respiratory: Negative for cough and shortness of breath.   Cardiovascular: Negative for chest pain and palpitations.  Gastrointestinal: Negative for abdominal pain and vomiting.  Genitourinary: Negative for dysuria and hematuria.  Musculoskeletal: Negative for arthralgias and back pain.  Skin: Positive for wound. Negative for color change and rash.  Neurological: Negative for seizures, syncope and headaches.  All other systems reviewed and are negative.    Physical Exam Updated Vital Signs BP 119/71 (BP Location: Left Leg)   Pulse 88   Temp 99.1 F (37.3 C) (Oral)   Resp 20   Ht 5\' 10"  (1.778 m)   Wt 90.7 kg   SpO2 98%   BMI 28.70 kg/m   Physical Exam  Constitutional: He is oriented to person, place, and time. He appears well-developed and well-nourished.  HENT:  Head: Normocephalic and atraumatic.  Eyes: Pupils are equal, round, and reactive to light. Conjunctivae and EOM are normal.  Neck: Normal range of motion. Neck supple.  Cardiovascular: Normal rate, regular rhythm, normal heart sounds and intact distal pulses.  No murmur heard. Pulmonary/Chest: Effort normal and breath sounds normal. No respiratory distress.  Abdominal: Soft. There is no  tenderness.  Musculoskeletal: Normal range of motion. He exhibits no edema.  Neurological: He is alert and oriented to person, place, and time.  Skin: Skin is warm and dry. Capillary refill takes less than 2 seconds.  Abscess to right proximal forearm, abscess to back of the right hand  Psychiatric: He has a normal mood and affect.  Nursing note and vitals reviewed.    ED Treatments / Results  Labs (all labs ordered are listed, but only abnormal results are displayed) Labs Reviewed  COMPREHENSIVE METABOLIC PANEL - Abnormal; Notable for the following components:      Result Value   Glucose, Bld 110 (*)    BUN 5 (*)    Albumin 3.4 (*)    AST 12 (*)    All other components within normal limits  CBC - Abnormal; Notable for the following components:   WBC 13.4 (*)    Platelets 115 (*)  All other components within normal limits  CBC - Abnormal; Notable for the following components:   WBC 12.6 (*)    All other components within normal limits  CULTURE, BLOOD (ROUTINE X 2)  CULTURE, BLOOD (ROUTINE X 2)  AEROBIC CULTURE (SUPERFICIAL SPECIMEN)  HIV ANTIBODY (ROUTINE TESTING W REFLEX)  I-STAT CG4 LACTIC ACID, ED  I-STAT CG4 LACTIC ACID, ED    EKG None  Radiology Dg Elbow Complete Right  Result Date: 03/31/2018 CLINICAL DATA:  Right elbow abscess, IV drug abuse skip EXAM: RIGHT ELBOW - COMPLETE 3+ VIEW COMPARISON:  None FINDINGS: Once again there are at least 15 needle fragments in the antecubital in proximal forearm region. Abnormal focal cutaneous and subcutaneous soft tissue swelling medial to the elbow suspicious for potential abscess/infection. No findings of osteomyelitis or elbow effusion. IMPRESSION: Abnormal soft tissue swelling lateral to the elbow suspicious for subcutaneous abscess. There are about 15 needle fragments around the elbow. Electronically Signed   By: Gaylyn Rong M.D.   On: 03/31/2018 22:39   Dg Forearm Right  Result Date: 03/31/2018 CLINICAL DATA:   Abscesses.  IV drug user. EXAM: RIGHT FOREARM - 2 VIEW COMPARISON:  None. FINDINGS: I count about 16 linear foreign bodies projecting over the antecubital/elbow region and proximal forearm compatible needle fragments. A similar needle fragment projects over the base of the thumb. Focal soft tissue swelling lateral to the elbow possibly from localized subcutaneous infection. No elbow joint effusion is identified. IMPRESSION: 1. About 16 linear foreign bodies compatible with needle fragments are present along the antecubital region and proximal forearm with another needle fragment near the base of the thumb. 2. Soft tissue swelling especially lateral to the elbow suspicious for infection. 3. No bony destructive findings or elbow joint effusion. Electronically Signed   By: Gaylyn Rong M.D.   On: 03/31/2018 22:38   Dg Hand Complete Right  Result Date: 03/31/2018 CLINICAL DATA:  History of IV drug abuse with pain and swelling in the right hand near the base of the thumb. EXAM: RIGHT HAND - COMPLETE 3+ VIEW COMPARISON:  06/06/2013 FINDINGS: Previously seen fifth metacarpal fracture has healed. There are multiple radiopaque linear densities identified within the soft tissues of the hand consistent with needles. This is consistent with the given clinical history. No acute fracture or dislocation is noted. Significant soft tissue swelling is noted particularly between the first and second metacarpals likely related to the radiopaque foreign bodies. IMPRESSION: Considerable soft tissue swelling without acute bony abnormality. Multiple radiopaque foreign bodies are noted in the soft tissues in the area of clinical concern consistent with needle fragments. This likely contributes to the underlying abnormality. Electronically Signed   By: Alcide Clever M.D.   On: 03/31/2018 16:43    Procedures .Marland KitchenIncision and Drainage Date/Time: 03/31/2018 9:37 PM Performed by: Virgina Norfolk, DO Authorized by: Virgina Norfolk, DO    Consent:    Consent obtained:  Verbal   Consent given by:  Patient   Risks discussed:  Bleeding, damage to other organs, incomplete drainage, infection and pain   Alternatives discussed:  No treatment Location:    Type:  Abscess   Size:  4 cm x 4 cm   Location: right hand. Pre-procedure details:    Skin preparation:  Chloraprep Anesthesia (see MAR for exact dosages):    Anesthesia method:  Local infiltration   Local anesthetic:  Lidocaine 1% w/o epi Procedure type:    Complexity:  Simple Procedure details:    Needle aspiration: no  Incision types:  Single straight   Incision depth:  Subcutaneous   Scalpel blade:  11   Wound management:  Probed and deloculated and extensive cleaning   Drainage:  Purulent   Drainage amount:  Copious   Wound treatment:  Wound left open   Packing materials:  1/2 in iodoform gauze Post-procedure details:    Patient tolerance of procedure:  Tolerated well, no immediate complications .Marland KitchenIncision and Drainage Date/Time: 03/31/2018 9:38 PM Performed by: Virgina Norfolk, DO Authorized by: Virgina Norfolk, DO   Consent:    Consent obtained:  Verbal   Consent given by:  Patient   Risks discussed:  Bleeding, damage to other organs, incomplete drainage, infection and pain   Alternatives discussed:  No treatment Location:    Type:  Abscess   Size:  3 cm x 3 cm   Location: right proximal forearm  Pre-procedure details:    Skin preparation:  Chloraprep Anesthesia (see MAR for exact dosages):    Anesthesia method:  Local infiltration   Local anesthetic:  Lidocaine 1% w/o epi Procedure type:    Complexity:  Simple Procedure details:    Needle aspiration: no     Incision types:  Single straight   Incision depth:  Subcutaneous   Scalpel blade:  11   Wound management:  Probed and deloculated   Drainage:  Purulent   Drainage amount:  Copious   Wound treatment:  Wound left open   Packing materials:  1/2 in iodoform gauze Post-procedure details:     Patient tolerance of procedure:  Tolerated well, no immediate complications   (including critical care time)  Medications Ordered in ED Medications  vancomycin (VANCOCIN) IVPB 1000 mg/200 mL premix (has no administration in time range)  enoxaparin (LOVENOX) injection 40 mg (has no administration in time range)  acetaminophen (TYLENOL) tablet 650 mg (has no administration in time range)    Or  acetaminophen (TYLENOL) suppository 650 mg (has no administration in time range)  ketorolac (TORADOL) 30 MG/ML injection 30 mg (30 mg Intravenous Given 04/01/18 0030)  vancomycin (VANCOCIN) 2,000 mg in sodium chloride 0.9 % 500 mL IVPB (2,000 mg Intravenous Transfusing/Transfer 03/31/18 2248)  lidocaine (PF) (XYLOCAINE) 1 % injection 5 mL (5 mLs Other Given 03/31/18 2247)     Initial Impression / Assessment and Plan / ED Course  I have reviewed the triage vital signs and the nursing notes.  Pertinent labs & imaging results that were available during my care of the patient were reviewed by me and considered in my medical decision making (see chart for details).     Joseph [name] is a 37 year old male with history of IV heroin and meth use who presents to the ED with right hand and right forearm abscess.  Patient with unremarkable vitals.  No fever.  Patient with swelling in the right elbow area and right hand area for the last several days.  No fever.  Continues to inject IV drugs in the right forearm area.  Denies any recent IV drug use in the right hand.  Has abscesses on the back of the right hand and on the right proximal forearm.  No concern for septic joint in the right elbow.  Does not appear to have infection over the joints in the hand as well.  The x-rays showed multiple needle fragments in both areas.  Patient with no joint effusions.  Contacted Dr. Melvyn Novas with hand surgery who recommends bedside I&D and packing.  Patient started on IV vancomycin.  Patient  with leukocytosis of 13.1 but  otherwise unremarkable labs.  Wound culture was collected.  Patient with blood culture collected.  Patient admitted to medicine for further IV antibiotics.  Hemodynamically stable throughout my care.  No murmur, low concern for endocarditis given no tachycardia, no fever.  This chart was dictated using voice recognition software.  Despite best efforts to proofread,  errors can occur which can change the documentation meaning.   Final Clinical Impressions(s) / ED Diagnoses   Final diagnoses:  Abscess of dorsum of right hand  Abscess of right forearm    ED Discharge Orders    None       Virgina Norfolk, DO 04/01/18 0116

## 2018-03-31 NOTE — Consult Note (Signed)
Care was discussed with the emergency department. The patient does have the 2 abscess 1 over the hand and one in the antecubital region. Is recommended the patient undergo local incision and drainage there in the emergency department. This be taken care of by the EDP. The wounds to be drained culture should be taken. Patient be admitted to the internal medicine service for IV antibiotics. Wound care consultation will need to be placed for packing and wound care of the open areas. If the patient does not respond to these treatments the patient will require further incision and drainage in the operating room. Please contact me directly should you have any questions. [phone].

## 2018-03-31 NOTE — ED Notes (Signed)
Due to exceptionally difficult venous access- per MD [place] administer ABX w/ only one set of cultures.

## 2018-03-31 NOTE — Progress Notes (Signed)
Pharmacy Antibiotic Note  Joseph [name] is a 37 y.o. male admitted on 03/31/2018 with cellulitis.  Pharmacy has been consulted for vancomycin dosing. Afebrile, WBC - 13.4, lactic acid - WNL  Pt is IV drug user w/ swollen right hand w/ abscess for 2-3 days  Plan: Vancomycin 2g x1, then vancomycin 1g Q8h Monitor renal function and adjust abx per C&S, troughs as needed   Height: 5\' 10"  (177.8 cm) Weight: 200 lb (90.7 kg) IBW/kg (Calculated) : 73  Temp (24hrs), Avg:98.3 F (36.8 C), Min:97.9 F (36.6 C), Max:98.6 F (37 C)  Recent Labs  Lab 03/31/18 1532 03/31/18 1600  WBC 13.4*  --   CREATININE 0.90  --   LATICACIDVEN  --  1.30    Estimated Creatinine Clearance: 127.3 mL/min (by C-G formula based on SCr of 0.9 mg/dL).    No Known Allergies  Antimicrobials this admission: Vancomycin 10/9 >>   Dose adjustments this admission: N/A  Microbiology results: Pending  Thank you for allowing pharmacy to be a part of this patient's care.  Koleen Nimrod 03/31/2018 9:24 PM

## 2018-03-31 NOTE — ED Triage Notes (Signed)
The pt has a swollen rt hand with an abcess for 2-3 days.  He is a crystal meth user and was attempting to shoot up and now its likke this.  He is supposed to be going to the methadone clinic mext week

## 2018-04-01 ENCOUNTER — Other Ambulatory Visit: Payer: Self-pay

## 2018-04-01 DIAGNOSIS — F1910 Other psychoactive substance abuse, uncomplicated: Secondary | ICD-10-CM

## 2018-04-01 DIAGNOSIS — D696 Thrombocytopenia, unspecified: Secondary | ICD-10-CM

## 2018-04-01 LAB — CBC
HCT: 44.1 % (ref 39.0–52.0)
Hemoglobin: 13.3 g/dL (ref 13.0–17.0)
MCH: 28.5 pg (ref 26.0–34.0)
MCHC: 30.2 g/dL (ref 30.0–36.0)
MCV: 94.6 fL (ref 80.0–100.0)
PLATELETS: 288 10*3/uL (ref 150–400)
RBC: 4.66 MIL/uL (ref 4.22–5.81)
RDW: 12.1 % (ref 11.5–15.5)
WBC: 12.6 10*3/uL — AB (ref 4.0–10.5)
nRBC: 0 % (ref 0.0–0.2)

## 2018-04-01 LAB — ETHANOL: Alcohol, Ethyl (B): <10 mg/dL (ref <10)

## 2018-04-01 LAB — RAPID URINE DRUG SCREEN, HOSP PERFORMED
Amphetamines: POSITIVE — AB
Barbiturates: NOT DETECTED
Benzodiazepines: NOT DETECTED
COCAINE: NOT DETECTED
OPIATES: POSITIVE — AB
Tetrahydrocannabinol: NOT DETECTED

## 2018-04-01 LAB — HIV ANTIBODY (ROUTINE TESTING W REFLEX): HIV SCREEN 4TH GENERATION: NONREACTIVE

## 2018-04-01 MED ORDER — MORPHINE SULFATE (PF) 2 MG/ML IV SOLN
1.0000 mg | INTRAVENOUS | Status: DC | PRN
  Administered 2018-04-01: 2 mg via INTRAVENOUS
  Filled 2018-04-01: qty 1

## 2018-04-01 MED ORDER — INFLUENZA VAC SPLIT QUAD 0.5 ML IM SUSY: 0.5000 mL | PREFILLED_SYRINGE | INTRAMUSCULAR | Status: DC

## 2018-04-01 NOTE — Progress Notes (Signed)
Patient was complaining about being hungry and being NPO again. RN called Melvyn Novas, MD and asked if patient could be placed on a diet. RN notified MD that [facility] had been by to see pt and took packing out and is ordering supplies to re-pack the wound. MD stated that he was not taking the patient back to surgery it was okay to put pt on a diet. Will continue to monitor.

## 2018-04-01 NOTE — Progress Notes (Addendum)
PROGRESS NOTE    Joseph [name]  ZOX:[id number] DOB: 11/03/1980 DOA: 03/31/2018 PCP: Patient, No Pcp Per   Brief Narrative:   37 year old male with a history of several months IV drug use, injecting amphetamine, last use a week ago, asthma, depression, anxiety, who about 2 days ago, noticed his right hand and area to the right elbow tightness and soreness.  The swelling began to worsen leading him to be seen at the ED he denies any fever, chills, nausea or vomiting.  Pain was about 9 or 10 intensity on presentation.  He denies any numbness, tingling, or unilateral weakness.  White count was 13.4, lactic acid was normal.  Right hand x-ray shows soft tissue swelling with out acute bony abnormality.  Needle fragments were seen.  X-ray of the right elbow and forearm swelling suspicious for subcutaneous abscess, with needle fragment in as well.  He received IV vancomycin in the ER.  Case was discussed with Dr. Nevada Crane, Orthopedics, recommending I+D in the ER, wound culture, and admission for IV antibiotics.  Wound care consult was obtained initiated this morning.  Assessment & Plan:   Principal Problem:   Abscess of right hand Active Problems:   Abscess of right forearm   GAD (generalized anxiety disorder)   Thrombocytopenia (HCC)   Depression  Right hand and right forearm abscesses - White count 13.4.  - x-ray of the right hand and right elbow shows soft tissue swelling, along with needle fragments.  Good capillary refill.  Received IV vancomycin after cultures were obtained.   -culture pending.  - Wound care consult appreciated: twice daily dressing removal, cleansing and obliterating of dead space using 1/4 inch plain gauze packing strip topped with dry dressings and secured with Kerlix rol gauze wrap.  The wrap will be topped with 4-inch ACE bandage wrapped from fingers to above elbow. Extremity to be elevated above the heart using two pillows. -Continue vancomycin IV Monitor for any  signs of numbness, tingling, worsening pain in his right hand and forearm.  If neurovascular compromise is noted, or suspected, will obtain hand surgery consult  Thrombocytopenia, platelets on admission were 115, in the setting of IV drug use, infection.   Repeat CBC in a.m. Hold anticoagulation if platelet count is less than 50.  Anxiety and depression Patient has not been taking his home medications for a very long time, including BuSpar, Zoloft and trazodone.  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  He needs continued IV vancomycin until cultures return and depending on improvement may need further debridement/I&D.     DVT prophylaxis: Lovenox Code Status: Full code  Family Communication: None Disposition Plan: To home when clinically stable  Consultants:   Wound consult  Phone consultation with Dr. Melvyn Novas   Antimicrobials:   Vancomycin IV   Subjective:  Patient complains of right arm discomfort, but denies any severe pain, numbness or tingling at this time.  He denies any shortness of breath or chest pain.  He denies any lower extremity swelling or calf pain.  He denies any abdominal pain, nausea or vomiting.  He denies any increased nervousness or anxiety.  Is any vision changes or headache.  Objective: Vitals:   03/31/18 2019 04/01/18 0008 04/01/18 0553 04/01/18 0900  BP: 126/80 119/71 (!) 122/59 135/70  Pulse: Marland Kitchen)  102 88 73 68  Resp: 18 20 20 18   Temp: 97.9 F (36.6 C) 99.1 F (37.3 C) 98.6 F (37 C) 98 F (36.7 C)  TempSrc: Oral Oral Oral Oral  SpO2: 99% 98% 98% 100%  Weight:      Height:        Intake/Output Summary (Last 24 hours) at 04/01/2018 1240 Last data filed at 04/01/2018 0640 Gross per 24 hour  Intake 0 ml  Output -  Net 0 ml   Filed Weights   03/31/18 1517    Weight: 90.7 kg    Examination:  General exam: Appears calm and comfortable  Respiratory system: Clear to auscultation. Respiratory effort normal. Cardiovascular system: S1 & S2 heard, RRR. No JVD, murmurs, rubs, gallops or clicks. No pedal edema. Gastrointestinal system: Abdomen is nondistended, soft and nontender. No organomegaly or masses felt. Normal bowel sounds heard. Central nervous system: Alert and oriented. No focal neurological deficits. Extremities: Symmetric 5 x 5 power. Skin remarkable for a 2 cm right hand wound, with moderate serosanguineous drainage, non-odorous in the right elbow, 2.5 cm odorous wound. Psychiatry: Judgement and insight appear normal. Mood & affect appropriate.     Data Reviewed: I have personally reviewed following labs and imaging studies  CBC: Recent Labs  Lab 03/31/18 1532 04/01/18 0009  WBC 13.4* 12.6*  HGB 14.3 13.3  HCT 45.4 44.1  MCV 93.0 94.6  PLT 115* 288   Basic Metabolic Panel: Recent Labs  Lab 03/31/18 1532  NA 139  K 3.8  CL 104  CO2 29  GLUCOSE 110*  BUN 5*  CREATININE 0.90  CALCIUM 9.2   GFR: Estimated Creatinine Clearance: 127.3 mL/min (by C-G formula based on SCr of 0.9 mg/dL). Liver Function Tests: Recent Labs  Lab 03/31/18 1532  AST 12*  ALT 12  ALKPHOS 74  BILITOT 0.9  PROT 7.7  ALBUMIN 3.4*   No results for input(s): LIPASE, AMYLASE in the last 168 hours. No results for input(s): AMMONIA in the last 168 hours. Coagulation Profile: No results for input(s): INR, PROTIME in the last 168 hours. Cardiac Enzymes: No results for input(s): CKTOTAL, CKMB, CKMBINDEX, TROPONINI in the last 168 hours. BNP (last 3 results) No results for input(s): PROBNP in the last 8760 hours. HbA1C: No results for input(s): HGBA1C in the last 72 hours. CBG: No results for input(s): GLUCAP in the last 168 hours. Lipid Profile: No results for input(s): CHOL, HDL, LDLCALC, TRIG, CHOLHDL, LDLDIRECT in the last 72  hours. Thyroid Function Tests: No results for input(s): TSH, T4TOTAL, FREET4, T3FREE, THYROIDAB in the last 72 hours. Anemia Panel: No results for input(s): VITAMINB12, FOLATE, FERRITIN, TIBC, IRON, RETICCTPCT in the last 72 hours. Sepsis Labs: Recent Labs  Lab 03/31/18 1600  LATICACIDVEN 1.30    Recent Results (from the past 240 hour(s))  Blood culture (routine x 2)     Status: None (Preliminary result)   Collection Time: 03/31/18  9:30 PM  Result Value Ref Range Status   Specimen Description SITE NOT SPECIFIED  Final   Special Requests   Final    BOTTLES DRAWN AEROBIC AND ANAEROBIC Blood Culture adequate volume   Culture   Final    NO GROWTH < 12 HOURS Performed at [facility], [address], [place], [place] [address]    Report Status PENDING  Incomplete  Wound or Superficial Culture     Status: None (Preliminary result)   Collection Time: 03/31/18 10:50 PM  Result Value Ref Range  Status   Specimen Description WOUND RIGHT HAND  Final   Special Requests NONE  Final   Gram Stain   Final    ABUNDANT WBC PRESENT, PREDOMINANTLY PMN FEW GRAM POSITIVE COCCI RARE GRAM VARIABLE ROD    Culture   Final    TOO YOUNG TO READ Performed at [facility], [address], [place], [place] [address]    Report Status PENDING  Incomplete         Radiology Studies: Dg Elbow Complete Right  Result Date: 03/31/2018 CLINICAL DATA:  Right elbow abscess, IV drug abuse skip EXAM: RIGHT ELBOW - COMPLETE 3+ VIEW COMPARISON:  None FINDINGS: Once again there are at least 15 needle fragments in the antecubital in proximal forearm region. Abnormal focal cutaneous and subcutaneous soft tissue swelling medial to the elbow suspicious for potential abscess/infection. No findings of osteomyelitis or elbow effusion. IMPRESSION: Abnormal soft tissue swelling lateral to the elbow suspicious for subcutaneous abscess. There are about 15 needle fragments around the elbow. Electronically Signed    By: Gaylyn Rong M.D.   On: 03/31/2018 22:39   Dg Forearm Right  Result Date: 03/31/2018 CLINICAL DATA:  Abscesses.  IV drug user. EXAM: RIGHT FOREARM - 2 VIEW COMPARISON:  None. FINDINGS: I count about 16 linear foreign bodies projecting over the antecubital/elbow region and proximal forearm compatible needle fragments. A similar needle fragment projects over the base of the thumb. Focal soft tissue swelling lateral to the elbow possibly from localized subcutaneous infection. No elbow joint effusion is identified. IMPRESSION: 1. About 16 linear foreign bodies compatible with needle fragments are present along the antecubital region and proximal forearm with another needle fragment near the base of the thumb. 2. Soft tissue swelling especially lateral to the elbow suspicious for infection. 3. No bony destructive findings or elbow joint effusion. Electronically Signed   By: Gaylyn Rong M.D.   On: 03/31/2018 22:38   Dg Hand Complete Right  Result Date: 03/31/2018 CLINICAL DATA:  History of IV drug abuse with pain and swelling in the right hand near the base of the thumb. EXAM: RIGHT HAND - COMPLETE 3+ VIEW COMPARISON:  06/06/2013 FINDINGS: Previously seen fifth metacarpal fracture has healed. There are multiple radiopaque linear densities identified within the soft tissues of the hand consistent with needles. This is consistent with the given clinical history. No acute fracture or dislocation is noted. Significant soft tissue swelling is noted particularly between the first and second metacarpals likely related to the radiopaque foreign bodies. IMPRESSION: Considerable soft tissue swelling without acute bony abnormality. Multiple radiopaque foreign bodies are noted in the soft tissues in the area of clinical concern consistent with needle fragments. This likely contributes to the underlying abnormality. Electronically Signed   By: Alcide Clever M.D.   On: 03/31/2018 16:43        Scheduled  Meds: . enoxaparin (LOVENOX) injection  40 mg Subcutaneous Q24H  . [START ON 04/02/2018] Influenza vac split quadrivalent PF  0.5 mL Intramuscular Tomorrow-1000   Continuous Infusions: . vancomycin 1,000 mg (04/01/18 0640)     LOS: 0 days       Marlin Canary DO [facility]   If 7PM-7AM, please contact night-coverage [email or web address] Password [email or web address] 04/01/2018, 12:40 PM

## 2018-04-01 NOTE — Plan of Care (Signed)
  Problem: Education: Goal: Knowledge of General Education information will improve Description Including pain rating scale, medication(s)/side effects and non-pharmacologic comfort measures Outcome: Progressing   

## 2018-04-01 NOTE — Consult Note (Signed)
WOC Nurse wound consult note Reason for Consult: Two I&D sites on right arm, consult requested to provide Nursing with orders post procedure Wound type: infectious, post procedure Pressure Injury POA: N/A Measurement: right hand:  1.5cm x 0.6cm x 2cm red, moist with macerated periwound tissue, moderate amount serosanguinous exudate Right elbow: 2.2cm x 0.8cm x 3cm red, moist with macerated periwound tissue, moderate amount serosanguinous exudate Wound bed:As described above Drainage (amount, consistency, odor) As described above Periwound: As described above Dressing procedure/placement/frequency: Nursing is provided with guidance via the Orders for twice daily dressing removal, cleansing and obliterating of dead space using 1/4 inch plain gauze packing strip topped with dry dressings and secured with Kerlix rol gauze wrap.  The wrap will be topped with 4-inch ACE bandage wrapped from fingers to above elbow. Extremity to be elevated above the heart using two pillows.  WOC nursing team will not follow, but will remain available to this patient, the nursing and medical teams.  Please re-consult if needed. Thanks, Ladona Mow, MSN, RN, GNP, [place]  Pager# [phone]

## 2018-04-02 ENCOUNTER — Inpatient Hospital Stay (HOSPITAL_COMMUNITY): Payer: Self-pay

## 2018-04-02 DIAGNOSIS — L02511 Cutaneous abscess of right hand: Principal | ICD-10-CM

## 2018-04-02 LAB — CBC
HEMATOCRIT: 44.7 % (ref 39.0–52.0)
HEMOGLOBIN: 13.5 g/dL (ref 13.0–17.0)
MCH: 28.4 pg (ref 26.0–34.0)
MCHC: 30.2 g/dL (ref 30.0–36.0)
MCV: 93.9 fL (ref 80.0–100.0)
PLATELETS: 174 10*3/uL (ref 150–400)
RBC: 4.76 MIL/uL (ref 4.22–5.81)
RDW: 11.8 % (ref 11.5–15.5)
WBC: 6.5 10*3/uL (ref 4.0–10.5)
nRBC: 0 % (ref 0.0–0.2)

## 2018-04-02 LAB — VANCOMYCIN, TROUGH: Vancomycin Tr: 9 ug/mL — ABNORMAL LOW (ref 15–20)

## 2018-04-02 MED ORDER — VANCOMYCIN HCL 10 G IV SOLR
1250.0000 mg | Freq: Three times a day (TID) | INTRAVENOUS | Status: DC
  Filled 2018-04-02 (×2): qty 1250

## 2018-04-02 MED ORDER — CEPHALEXIN 500 MG PO CAPS: 500.0000 mg | ORAL_CAPSULE | Freq: Three times a day (TID) | ORAL | 0 refills | Status: AC

## 2018-04-02 MED ORDER — DOXYCYCLINE HYCLATE 100 MG PO CAPS: 100.0000 mg | ORAL_CAPSULE | Freq: Two times a day (BID) | ORAL | 0 refills | Status: DC

## 2018-04-02 MED ORDER — NICOTINE 14 MG/24HR TD PT24
14.0000 mg | MEDICATED_PATCH | Freq: Every day | TRANSDERMAL | Status: DC
  Filled 2018-04-02: qty 1

## 2018-04-02 NOTE — Progress Notes (Signed)
Discharge instructions completed with pt.  Pt verbalized understanding of the information.  Pt denies chest pain, shortness of breath, dizziness, lightheadedness, and n/v.  Pt's IV discontinued.  Pt discharged home.  

## 2018-04-02 NOTE — Progress Notes (Signed)
Pharmacy Antibiotic Note  Joseph [name] is a 37 y.o. male admitted on 03/31/2018 with cellulitis.  Pharmacy has been consulted for vancomycin dosing. Afebrile, WBC - 13.4, lactic acid - WNL  Pt is IV drug user w/ swollen right hand w/ abscess for 2-3 days  10/11 AM update: Vancomycin trough is low at 9  Plan: Increase vancomycin to 1250 mg IV q8h Repeat trough in 4-5 doses Watch renal function    Height: 5\' 10"  (177.8 cm) Weight: 200 lb (90.7 kg) IBW/kg (Calculated) : 73  Temp (24hrs), Avg:98.2 F (36.8 C), Min:97.7 F (36.5 C), Max:98.6 F (37 C)  Recent Labs  Lab 03/31/18 1532 03/31/18 1600 04/01/18 0009 04/02/18 0525  WBC 13.4*  --  12.6*  --   CREATININE 0.90  --   --   --   LATICACIDVEN  --  1.30  --   --   VANCOTROUGH  --   --   --  9*    Estimated Creatinine Clearance: 127.3 mL/min (by C-G formula based on SCr of 0.9 mg/dL).    No Known Allergies  Joseph [name] 04/02/2018 7:03 AM

## 2018-04-02 NOTE — Discharge Summary (Addendum)
Physician Discharge Summary  STYLIANOS STRADLING UEA:[id number] DOB: 17-May-1981 DOA: 03/31/2018  PCP: Patient, No Pcp Per  Admit date: 03/31/2018 Discharge date: 04/02/2018  Admitted From: Home  Disposition:  Home   Recommendations for Outpatient Follow-up:  1. Follow up with [facility], Dr. Melvyn Novas in 1 week 2. Please follow up culture results final from superficial wound   Home Health: None  Equipment/Devices: None  Discharge Condition: Good  CODE STATUS: FULL Diet recommendation: Regular  Brief/Interim Summary: Mr. Sleight is a 37 y.o. M with hx anxiety, as well as several months recent relapse IVDU, amphetamine who presented with a few days new hand swelling, redness and pain.  In ER, WBC 13K, appeared to have abscess, I&D'd in the ER, surface culture sent.  Started on IV vancomycin.     Principal discharge diagnosis: Abscess of right hand     Discharge Diagnoses:   Abscess of right hand I&D done in ER.  HIV negative.  Treated with IV vancomycin with improvement in hand pain and swelling.  WBC normalized, no further fever.    Culture growing beta-hemolytic strep only.  Blood cultures negative.    X-ray shows numerous needle fragments both in hand and forearm.  Discussed with Dr. Melvyn Novas, agree hard to know how this affects the natural history of his infection, but certainly does not preclude medical management.  Discharge on Keflex plus doxycycline for 10 days.  Follow-up with Dr. Orlan Leavens has been arranged.  Patient was instructed on how to pack his wounds twice daily.   Thrombocytopenia Resolved  Mood disorder IV drug abuse Patient is familiar with an outpatient treatment facility, which she knows has intake 7 days a week.  He plans to go there tomorrow.     Discharge Instructions  Discharge Instructions    Diet general   Complete by:  As directed    Discharge instructions   Complete by:  As directed    From Dr. Maryfrances Bunnell: You were admitted for hand  infection. You were treated by opening the infection to let the pus out. Pack this twice daily, and keep covered. You may shower, pack after showering.  Take cephalexin/Keflex 500 mg three times daily for the next 10 days Take doxycyline 100 mg twice daily for the next 10 days While taking antibiotics, it is reasonable to take probiotics (healthy bacteria) once or twice daily until a few days after you finish the antibiotics, in order to prevent diarrhea from the antibiotics.  No one kind of probiotic is proven better than another, and the staff at the pharmacy can direct you where to find their probiotics when you go there.  Follow up with Dr. Melvyn Novas at [facility]. You have an appointment with him on Tuesday Oct 15 at 1:15 PM. His office number is [phone]   If you have worsening fever, swelling, drainage or feel flu-like, return to the ER. For routine pain control in the next few days, take acetaminophen/Tylenol 1000 mg up to three times daily or better, ibuprofen 600-800 mg (3 or 4 over-the-counter tabs) three times daily up to a week.   Increase activity slowly   Complete by:  As directed      Allergies as of 04/02/2018   No Known Allergies     Medication List    STOP taking these medications   busPIRone 10 MG tablet Commonly known as:  BUSPAR   docusate sodium 100 MG capsule Commonly known as:  COLACE   hydrOXYzine 25 MG tablet Commonly known  as:  ATARAX/VISTARIL   multivitamin with minerals Tabs tablet   neomycin-bacitracin-polymyxin Oint Commonly known as:  NEOSPORIN   sertraline 50 MG tablet Commonly known as:  ZOLOFT   traZODone 50 MG tablet Commonly known as:  DESYREL     TAKE these medications   acetaminophen 500 MG tablet Commonly known as:  TYLENOL Take 1,000 mg by mouth every 6 (six) hours as needed for mild pain.   cephALEXin 500 MG capsule Commonly known as:  KEFLEX Take 1 capsule (500 mg total) by mouth 3 (three) times daily for  10 days.   doxycycline 100 MG capsule Commonly known as:  VIBRAMYCIN Take 1 capsule (100 mg total) by mouth 2 (two) times daily.      Follow-up Information    Bradly Bienenstock, MD. Schedule an appointment as soon as possible for a visit in 1 week(s).   Specialty:  Orthopedic Surgery Why:  October 15th, 2019 at 1:15pm Contact information: [address] [place] [address] [phone]          No Known Allergies  Consultations:  Orthopedics by phone   Procedures/Studies: Dg Elbow Complete Right  Result Date: 03/31/2018 CLINICAL DATA:  Right elbow abscess, IV drug abuse skip EXAM: RIGHT ELBOW - COMPLETE 3+ VIEW COMPARISON:  None FINDINGS: Once again there are at least 15 needle fragments in the antecubital in proximal forearm region. Abnormal focal cutaneous and subcutaneous soft tissue swelling medial to the elbow suspicious for potential abscess/infection. No findings of osteomyelitis or elbow effusion. IMPRESSION: Abnormal soft tissue swelling lateral to the elbow suspicious for subcutaneous abscess. There are about 15 needle fragments around the elbow. Electronically Signed   By: Gaylyn Rong M.D.   On: 03/31/2018 22:39   Dg Forearm Right  Result Date: 04/02/2018 CLINICAL DATA:  Status post I and D in the forearm and hand. EXAM: RIGHT FOREARM - 2 VIEW COMPARISON:  03/31/2018 FINDINGS: No acute bony abnormality is identified. Multiple radiopaque foreign bodies remain in the soft tissues about the elbow joint similar to that noted on the prior exam. The previously seen subcutaneous abscess has been drained and is less prominent laterally. IMPRESSION: Persistent foreign bodies.  No acute bony abnormality is seen. Incision and drainage with proximal lateral forearm. Electronically Signed   By: Alcide Clever M.D.   On: 04/02/2018 11:10   Dg Forearm Right  Result Date: 03/31/2018 CLINICAL DATA:  Abscesses.  IV drug user. EXAM: RIGHT FOREARM - 2 VIEW COMPARISON:   None. FINDINGS: I count about 16 linear foreign bodies projecting over the antecubital/elbow region and proximal forearm compatible needle fragments. A similar needle fragment projects over the base of the thumb. Focal soft tissue swelling lateral to the elbow possibly from localized subcutaneous infection. No elbow joint effusion is identified. IMPRESSION: 1. About 16 linear foreign bodies compatible with needle fragments are present along the antecubital region and proximal forearm with another needle fragment near the base of the thumb. 2. Soft tissue swelling especially lateral to the elbow suspicious for infection. 3. No bony destructive findings or elbow joint effusion. Electronically Signed   By: Gaylyn Rong M.D.   On: 03/31/2018 22:38   Dg Hand Complete Right  Result Date: 03/31/2018 CLINICAL DATA:  History of IV drug abuse with pain and swelling in the right hand near the base of the thumb. EXAM: RIGHT HAND - COMPLETE 3+ VIEW COMPARISON:  06/06/2013 FINDINGS: Previously seen fifth metacarpal fracture has healed. There are multiple radiopaque linear densities identified  within the soft tissues of the hand consistent with needles. This is consistent with the given clinical history. No acute fracture or dislocation is noted. Significant soft tissue swelling is noted particularly between the first and second metacarpals likely related to the radiopaque foreign bodies. IMPRESSION: Considerable soft tissue swelling without acute bony abnormality. Multiple radiopaque foreign bodies are noted in the soft tissues in the area of clinical concern consistent with needle fragments. This likely contributes to the underlying abnormality. Electronically Signed   By: Alcide Clever M.D.   On: 03/31/2018 16:43       Subjective: Feeling well.  Arm feels better.  Redness and swelling are improved.  No fever, chills, malaise, body aches.  No vomiting.  Tolerating p.o. well.  Discharge Exam: Vitals:    04/01/18 2150 04/02/18 0513  BP: 125/65 128/70  Pulse: 83 69  Resp: 16   Temp: 98.6 F (37 C) 98.5 F (36.9 C)  SpO2: 100% 100%   Vitals:   04/01/18 0900 04/01/18 1334 04/01/18 2150 04/02/18 0513  BP: 135/70 (!) 124/53 125/65 128/70  Pulse: 68 78 83 69  Resp: 18 18 16    Temp: 98 F (36.7 C) 97.7 F (36.5 C) 98.6 F (37 C) 98.5 F (36.9 C)  TempSrc: Oral Oral Oral Oral  SpO2: 100% 96% 100% 100%  Weight:      Height:        General: Pt is alert, awake, not in acute distress, lying in bed, interactive Cardiovascular: RRR, S1/S2 +, no rubs, no gallops Respiratory: CTA bilaterally, no wheezing, no rhonchi Abdominal: Soft, NT, ND, bowel sounds + Extremities: no edema, no cyanosis, the right forearm has some visual postinflammatory change, but no more redness or edema, no draining from incision wound on the forearm, the right hand is intact neurovascularly, has mild but improved swelling, good movement, likewise no more redness or edema    The results of significant diagnostics from this hospitalization (including imaging, microbiology, ancillary and laboratory) are listed below for reference.     Microbiology: Recent Results (from the past 240 hour(s))  Blood culture (routine x 2)     Status: None (Preliminary result)   Collection Time: 03/31/18  9:30 PM  Result Value Ref Range Status   Specimen Description SITE NOT SPECIFIED  Final   Special Requests   Final    BOTTLES DRAWN AEROBIC AND ANAEROBIC Blood Culture adequate volume   Culture   Final    NO GROWTH < 12 HOURS Performed at [facility], [address], [place], [place] [address]    Report Status PENDING  Incomplete  Wound or Superficial Culture     Status: None (Preliminary result)   Collection Time: 03/31/18 10:50 PM  Result Value Ref Range Status   Specimen Description WOUND RIGHT HAND  Final   Special Requests NONE  Final   Gram Stain   Final    ABUNDANT WBC PRESENT, PREDOMINANTLY PMN FEW GRAM  POSITIVE COCCI RARE GRAM VARIABLE ROD Performed at [facility], [address], [place], [place] [address]    Culture FEW GROUP A STREP (S.PYOGENES) ISOLATED  Final   Report Status PENDING  Incomplete     Labs: BNP (last 3 results) No results for input(s): BNP in the last 8760 hours. Basic Metabolic Panel: Recent Labs  Lab 03/31/18 1532  NA 139  K 3.8  CL 104  CO2 29  GLUCOSE 110*  BUN 5*  CREATININE 0.90  CALCIUM 9.2  Liver Function Tests: Recent Labs  Lab 03/31/18 1532  AST 12*  ALT 12  ALKPHOS 74  BILITOT 0.9  PROT 7.7  ALBUMIN 3.4*   No results for input(s): LIPASE, AMYLASE in the last 168 hours. No results for input(s): AMMONIA in the last 168 hours. CBC: Recent Labs  Lab 03/31/18 1532 04/01/18 0009 04/02/18 0739  WBC 13.4* 12.6* 6.5  HGB 14.3 13.3 13.5  HCT 45.4 44.1 44.7  MCV 93.0 94.6 93.9  PLT 115* 288 174   Cardiac Enzymes: No results for input(s): CKTOTAL, CKMB, CKMBINDEX, TROPONINI in the last 168 hours. BNP: Invalid input(s): POCBNP CBG: No results for input(s): GLUCAP in the last 168 hours. D-Dimer No results for input(s): DDIMER in the last 72 hours. Hgb A1c No results for input(s): HGBA1C in the last 72 hours. Lipid Profile No results for input(s): CHOL, HDL, LDLCALC, TRIG, CHOLHDL, LDLDIRECT in the last 72 hours. Thyroid function studies No results for input(s): TSH, T4TOTAL, T3FREE, THYROIDAB in the last 72 hours.  Invalid input(s): FREET3 Anemia work up No results for input(s): VITAMINB12, FOLATE, FERRITIN, TIBC, IRON, RETICCTPCT in the last 72 hours. Urinalysis    Component Value Date/Time   COLORURINE AMBER (A) 04/23/2012 1842   APPEARANCEUR CLEAR 04/23/2012 1842   LABSPEC 1.031 (H) 04/23/2012 1842   PHURINE 5.5 04/23/2012 1842   GLUCOSEU NEGATIVE 04/23/2012 1842   HGBUR NEGATIVE 04/23/2012 1842   BILIRUBINUR SMALL (A) 04/23/2012 1842   KETONESUR NEGATIVE 04/23/2012 1842   PROTEINUR NEGATIVE 04/23/2012 1842    UROBILINOGEN 1.0 04/23/2012 1842   NITRITE NEGATIVE 04/23/2012 1842   LEUKOCYTESUR NEGATIVE 04/23/2012 1842   Sepsis Labs Invalid input(s): PROCALCITONIN,  WBC,  LACTICIDVEN Microbiology Recent Results (from the past 240 hour(s))  Blood culture (routine x 2)     Status: None (Preliminary result)   Collection Time: 03/31/18  9:30 PM  Result Value Ref Range Status   Specimen Description SITE NOT SPECIFIED  Final   Special Requests   Final    BOTTLES DRAWN AEROBIC AND ANAEROBIC Blood Culture adequate volume   Culture   Final    NO GROWTH < 12 HOURS Performed at [facility], [address], [place], [place] [address]    Report Status PENDING  Incomplete  Wound or Superficial Culture     Status: None (Preliminary result)   Collection Time: 03/31/18 10:50 PM  Result Value Ref Range Status   Specimen Description WOUND RIGHT HAND  Final   Special Requests NONE  Final   Gram Stain   Final    ABUNDANT WBC PRESENT, PREDOMINANTLY PMN FEW GRAM POSITIVE COCCI RARE GRAM VARIABLE ROD Performed at [facility], [address] [place], [place] [address]    Culture FEW GROUP A STREP (S.PYOGENES) ISOLATED  Final   Report Status PENDING  Incomplete     Time coordinating discharge: 40 minutes      SIGNED:   Alberteen Sam, MD  [facility] 04/02/2018, 1:05 PM

## 2018-04-02 NOTE — Progress Notes (Signed)
Pt refused dressing change on the right arm this shift.

## 2018-04-02 NOTE — Progress Notes (Signed)
NS made follow up appointment for pt with Dr. Melvyn Novas. Appointment put under discharge follow up.

## 2018-04-03 LAB — AEROBIC CULTURE  (SUPERFICIAL SPECIMEN)

## 2018-04-03 LAB — AEROBIC CULTURE W GRAM STAIN (SUPERFICIAL SPECIMEN)

## 2018-04-06 LAB — CULTURE, BLOOD (ROUTINE X 2)
Culture: NO GROWTH
Culture: NO GROWTH
Special Requests: ADEQUATE

## 2019-08-16 ENCOUNTER — Emergency Department (HOSPITAL_COMMUNITY)
Admission: EM | Admit: 2019-08-16 | Discharge: 2019-08-16 | Disposition: A | Discharge status: Home / Self Care | Payer: Self-pay | Attending: Emergency Medicine | Admitting: Emergency Medicine

## 2019-08-16 ENCOUNTER — Encounter (HOSPITAL_COMMUNITY): Payer: Self-pay | Admitting: Emergency Medicine

## 2019-08-16 ENCOUNTER — Other Ambulatory Visit: Payer: Self-pay

## 2019-08-16 ENCOUNTER — Emergency Department (HOSPITAL_COMMUNITY): Payer: Self-pay

## 2019-08-16 ENCOUNTER — Emergency Department (HOSPITAL_BASED_OUTPATIENT_CLINIC_OR_DEPARTMENT_OTHER): Payer: Self-pay

## 2019-08-16 DIAGNOSIS — F17210 Nicotine dependence, cigarettes, uncomplicated: Secondary | ICD-10-CM | POA: Insufficient documentation

## 2019-08-16 DIAGNOSIS — L03115 Cellulitis of right lower limb: Secondary | ICD-10-CM | POA: Insufficient documentation

## 2019-08-16 DIAGNOSIS — L538 Other specified erythematous conditions: Secondary | ICD-10-CM

## 2019-08-16 DIAGNOSIS — L02611 Cutaneous abscess of right foot: Secondary | ICD-10-CM | POA: Insufficient documentation

## 2019-08-16 DIAGNOSIS — L0291 Cutaneous abscess, unspecified: Secondary | ICD-10-CM

## 2019-08-16 DIAGNOSIS — R2241 Localized swelling, mass and lump, right lower limb: Secondary | ICD-10-CM | POA: Insufficient documentation

## 2019-08-16 DIAGNOSIS — F1110 Opioid abuse, uncomplicated: Secondary | ICD-10-CM | POA: Insufficient documentation

## 2019-08-16 DIAGNOSIS — L539 Erythematous condition, unspecified: Secondary | ICD-10-CM | POA: Insufficient documentation

## 2019-08-16 DIAGNOSIS — F1210 Cannabis abuse, uncomplicated: Secondary | ICD-10-CM | POA: Insufficient documentation

## 2019-08-16 DIAGNOSIS — F1910 Other psychoactive substance abuse, uncomplicated: Secondary | ICD-10-CM | POA: Insufficient documentation

## 2019-08-16 LAB — COMPREHENSIVE METABOLIC PANEL
ALT: 61 U/L — ABNORMAL HIGH (ref 0–44)
AST: 32 U/L (ref 15–41)
Albumin: 3.5 g/dL (ref 3.5–5.0)
Alkaline Phosphatase: 81 U/L (ref 38–126)
Anion gap: 10 (ref 5–15)
BUN: 10 mg/dL (ref 6–20)
CO2: 26 mmol/L (ref 22–32)
Calcium: 9.1 mg/dL (ref 8.9–10.3)
Chloride: 100 mmol/L (ref 98–111)
Creatinine, Ser: 0.97 mg/dL (ref 0.61–1.24)
GFR calc Af Amer: >60 mL/min (ref >60)
GFR calc non Af Amer: >60 mL/min (ref >60)
Glucose, Bld: 112 mg/dL — ABNORMAL HIGH (ref 70–99)
Potassium: 4.5 mmol/L (ref 3.5–5.1)
Sodium: 136 mmol/L (ref 135–145)
Total Bilirubin: 0.8 mg/dL (ref 0.3–1.2)
Total Protein: 8.4 g/dL — ABNORMAL HIGH (ref 6.5–8.1)

## 2019-08-16 LAB — CBC WITH DIFFERENTIAL/PLATELET
Abs Immature Granulocytes: 0.04 10*3/uL (ref 0.00–0.07)
Basophils Absolute: 0 10*3/uL (ref 0.0–0.1)
Basophils Relative: 0 %
Eosinophils Absolute: 0 10*3/uL (ref 0.0–0.5)
Eosinophils Relative: 1 %
HCT: 43.1 % (ref 39.0–52.0)
Hemoglobin: 13.7 g/dL (ref 13.0–17.0)
Immature Granulocytes: 1 %
Lymphocytes Relative: 13 %
Lymphs Abs: 1.1 10*3/uL (ref 0.7–4.0)
MCH: 30.2 pg (ref 26.0–34.0)
MCHC: 31.8 g/dL (ref 30.0–36.0)
MCV: 94.9 fL (ref 80.0–100.0)
Monocytes Absolute: 0.8 10*3/uL (ref 0.1–1.0)
Monocytes Relative: 9 %
Neutro Abs: 6.5 10*3/uL (ref 1.7–7.7)
Neutrophils Relative %: 76 %
Platelets: 170 10*3/uL (ref 150–400)
RBC: 4.54 MIL/uL (ref 4.22–5.81)
RDW: 11.9 % (ref 11.5–15.5)
WBC: 8.5 10*3/uL (ref 4.0–10.5)
nRBC: 0 % (ref 0.0–0.2)

## 2019-08-16 LAB — LACTIC ACID, PLASMA: Lactic Acid, Venous: 1.4 mmol/L (ref 0.5–1.9)

## 2019-08-16 MED ORDER — LIDOCAINE HCL (PF) 1 % IJ SOLN
10.0000 mL | Freq: Once | INTRAMUSCULAR | Status: AC
  Administered 2019-08-16: 22:00:00 10 mL via INTRADERMAL
  Filled 2019-08-16: qty 10

## 2019-08-16 MED ORDER — CEPHALEXIN 250 MG PO CAPS: 500.0000 mg | ORAL_CAPSULE | Freq: Once | ORAL | Status: DC

## 2019-08-16 MED ORDER — OXYCODONE-ACETAMINOPHEN 5-325 MG PO TABS
1.0000 | ORAL_TABLET | Freq: Once | ORAL | Status: AC
  Administered 2019-08-16: 1 via ORAL
  Filled 2019-08-16: qty 1

## 2019-08-16 MED ORDER — DOXYCYCLINE HYCLATE 100 MG PO CAPS: 100.0000 mg | ORAL_CAPSULE | Freq: Two times a day (BID) | ORAL | 0 refills | Status: AC

## 2019-08-16 MED ORDER — DOXYCYCLINE HYCLATE 100 MG PO TABS
100.0000 mg | ORAL_TABLET | Freq: Once | ORAL | Status: AC
  Administered 2019-08-16: 100 mg via ORAL
  Filled 2019-08-16: qty 1

## 2019-08-16 NOTE — Discharge Instructions (Signed)
Apply warm compresses to the area or soak the area in warm water to help continue express drainage.   Keep the wound clean and dry. Gently wash the wound with soap and water and make sure to pat it dry.   Take antibiotic and complete the entire course.   You can take Tylenol or Ibuprofen as directed for pain.  Followup with the ER  in 2 days for wound recheck and packing removal.   Return to the Emergency Department if you experienced any worsening/spreading redness or swelling, fever, worsening pain, or any other worsening or concerning symptoms.   

## 2019-08-16 NOTE — ED Provider Notes (Signed)
[facility] EMERGENCY DEPARTMENT Provider Note   CSN: [id number] Arrival date & time: 08/16/19  1706     History Chief Complaint  Patient presents with  . Foot Pain    Joseph [name] is a 39 y.o. male asthma was anxiety, asthma, depression, chronic abuse, IV drug use who presents for evaluation of right foot pain, swelling, redness x2 days.  He thinks it might have been from a spider bite.  He states he did not visualize a spider bite but states that he felt something sharp on his foot and then since then, has had pain, redness, swelling.  He does endorse IV drug use and states last week, he used IV heroin.  He states that when he normally uses IV, he does his arms.  He has never injected into his foot.  He states that the foot has become progressively more swollen, more red.  Denies any trauma, injury.  He has not noted any fevers.  He was picked up by [employer] for outstanding drug charges and started complaining of foot pain and so they brought him to the emergency department.  He denies any chest pain, difficulty breathing. He denies any exogenous hormone use, recent immobilization, prior history of DVT/PE, recent surgery, leg swelling, or long travel.  The history is provided by the patient.       Past Medical History:  Diagnosis Date  . Anxiety   . Asthma    as a child  . Depression   . Narcotic abuse [facility])     Patient Active Problem List   Diagnosis Date Noted  . Abscess 03/31/2018  . Thrombocytopenia (HCC) 03/31/2018  . Depression 03/31/2018  . Abscess of hand 03/31/2018  . Abscess of right hand 03/31/2018  . GAD (generalized anxiety disorder) 12/04/2014  . Alcohol use disorder, severe, dependence (HCC) 12/03/2014  . 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 Cocaine use disorder, severe, dependence (HCC) 12/03/2014  . Cannabis use disorder, severe, dependence (HCC)  12/03/2014  . Abscess of right forearm 03/24/2012    Past Surgical History:  Procedure Laterality Date  . CLOSED REDUCTION FINGER WITH PERCUTANEOUS PINNING Right 06/22/2013   Procedure: RIGHT HAND SMALL FINGER CLOSED MANIPULATION AND PINNING;  Surgeon: Sharma Covert, MD;  Location: [facility] OR;  Service: Orthopedics;  Laterality: Right;  . I & D EXTREMITY  03/24/2012   Procedure: IRRIGATION AND DEBRIDEMENT EXTREMITY;  Surgeon: Eldred Manges, MD;  Location: [facility] OR;  Service: Orthopedics;  Laterality: Right;  Right Forearm       Family History  Problem Relation Age of Onset  . Other Other   . Alcohol abuse Other     Social History   Tobacco Use  . Smoking status: Current Every Day Smoker    Packs/day: 1.00    Years: 10.00    Pack years: 10.00    Types: Cigarettes  . Smokeless tobacco: Never Used  Substance Use Topics  . Alcohol use: Yes    Alcohol/week: 12.0 standard drinks    Types: 12 Cans of beer per week  . Drug use: Yes    Types: Marijuana    Comment: hx of substance abuse    Home Medications Prior to Admission medications   Medication Sig Start Date End Date Taking? Authorizing Provider  acetaminophen (TYLENOL) 500 MG tablet Take 1,000 mg by mouth every 6 (six) hours as needed for mild pain.     [provider]  doxycycline (VIBRAMYCIN) 100 MG capsule Take 1 capsule (100 mg total) by mouth 2 (two) times daily for 7 days. 08/16/19 08/23/19  Maxwell CaulLayden, Amillion Macchia A, PA-C    Allergies    Patient has no known allergies.  Review of Systems   Review of Systems  Constitutional: Negative for fever.  Respiratory: Negative for shortness of breath.   Cardiovascular: Negative for chest pain.  Musculoskeletal:       Right foot and swelling  Skin: Positive for color change.  Neurological: Negative for weakness and numbness.  All other systems reviewed and are negative.   Physical Exam Updated Vital Signs BP 129/63 (BP Location: Left Arm)   Pulse 91   Temp 98.3 F (36.8  C) (Oral)   Resp 18   Ht 5\' 10"  (1.778 m)   Wt 113.4 kg   SpO2 97%   BMI 35.87 kg/m   Physical Exam Vitals and nursing note reviewed.  Constitutional:      Appearance: Normal appearance. He is well-developed.  HENT:     Head: Normocephalic and atraumatic.  Eyes:     General: Lids are normal.     Conjunctiva/sclera: Conjunctivae normal.     Pupils: Pupils are equal, round, and reactive to light.  Cardiovascular:     Rate and Rhythm: Normal rate and regular rhythm.     Pulses: Normal pulses.          Dorsalis pedis pulses are 2+ on the right side and 2+ on the left side.     Heart sounds: Normal heart sounds. No murmur. No friction rub. No gallop.   Pulmonary:     Effort: Pulmonary effort is normal.     Breath sounds: Normal breath sounds.     Comments: Lungs clear to auscultation bilaterally.  Symmetric chest rise.  No wheezing, rales, rhonchi. Abdominal:     Palpations: Abdomen is soft. Abdomen is not rigid.     Tenderness: There is no abdominal tenderness. There is no guarding.  Musculoskeletal:        General: Normal range of motion.     Cervical back: Full passive range of motion without pain.     Comments: Right lower extremity slightly more swollen than the left upper extremity.  No calf tenderness noted to palpation.  Flexion/plantarflexion of right ankle intact but with some subjective reports of pain.  He can wiggle all 5 toes.  Skin:    General: Skin is warm and dry.     Capillary Refill: Capillary refill takes less than 2 seconds.     Comments: Fuhs warmth, erythema noted to the dorsal aspect of the foot that begins at the base of the metatarsals and extends proximally.  It is not circumferential.  It does not cross over the ankle joint.  He has a central area of about 3 cm with fluctuance noted. Good distal cap refill.  LLE is not dusky in appearance or cool to touch.   Neurological:     Mental Status: He is alert and oriented to person, place, and time.      Comments: Sensation intact along major nerve distributions of BLE  Psychiatric:        Speech: Speech normal.            ED Results / Procedures / Treatments   Labs (all labs ordered are listed, but only abnormal results are displayed) Labs Reviewed  COMPREHENSIVE METABOLIC PANEL - Abnormal; Notable for the following components:  Result Value   Glucose, Bld 112 (*)    Total Protein 8.4 (*)    ALT 61 (*)    All other components within normal limits  LACTIC ACID, PLASMA  CBC WITH DIFFERENTIAL/PLATELET  LACTIC ACID, PLASMA    EKG None  Radiology DG Foot 2 Views Right  Result Date: 08/16/2019 CLINICAL DATA:  Swelling and redness EXAM: RIGHT FOOT - 2 VIEW COMPARISON:  None. FINDINGS: There is no evidence of fracture or dislocation. There is no evidence of arthropathy or other focal bone abnormality. Prominent dorsal soft tissue swelling. No radiopaque foreign body or soft tissue emphysema. IMPRESSION: No acute osseous abnormality. Prominent dorsal soft tissue swelling. Electronically Signed   By: Jasmine Pang M.D.   On: 08/16/2019 18:08   VAS [place] LOWER EXTREMITY VENOUS (DVT) ([facility] 7a-7p)  Result Date: 08/16/2019  Lower Venous DVTStudy Indications: Erythema.  Limitations: Poor ultrasound/tissue interface. Performing Technologist: Levada Schilling RDMS, RVT  Examination Guidelines: A complete evaluation includes B-mode imaging, spectral Doppler, color Doppler, and power Doppler as needed of all accessible portions of each vessel. Bilateral testing is considered an integral part of a complete examination. Limited examinations for reoccurring indications may be performed as noted. The reflux portion of the exam is performed with the patient in reverse Trendelenburg.  +---------+---------------+---------+-----------+----------+--------------+ RIGHT    CompressibilityPhasicitySpontaneityPropertiesThrombus Aging  +---------+---------------+---------+-----------+----------+--------------+ CFV      Full           Yes      Yes                                 +---------+---------------+---------+-----------+----------+--------------+ SFJ      Full                                                        +---------+---------------+---------+-----------+----------+--------------+ FV Prox  Full                                                        +---------+---------------+---------+-----------+----------+--------------+ FV Mid   Full                                                        +---------+---------------+---------+-----------+----------+--------------+ FV DistalFull                                                        +---------+---------------+---------+-----------+----------+--------------+ PFV      Full                                                        +---------+---------------+---------+-----------+----------+--------------+ POP  Full           Yes      Yes                                 +---------+---------------+---------+-----------+----------+--------------+ PTV      Full                                                        +---------+---------------+---------+-----------+----------+--------------+ PERO     Full                                                        +---------+---------------+---------+-----------+----------+--------------+ GSV      Full                                                        +---------+---------------+---------+-----------+----------+--------------+   +----+---------------+---------+-----------+----------+--------------+ LEFTCompressibilityPhasicitySpontaneityPropertiesThrombus Aging +----+---------------+---------+-----------+----------+--------------+ CFV Full           Yes      Yes                                  +----+---------------+---------+-----------+----------+--------------+     Summary: RIGHT: - There is no evidence of deep vein thrombosis in the lower extremity.  - No cystic structure found in the popliteal fossa. - 4.4cm lymph node noted in right groin   *See table(s) above for measurements and observations.    Preliminary     Procedures .Marland KitchenIncision and Drainage  Date/Time: 08/16/2019 10:00 PM Performed by: Volanda Napoleon, PA-C Authorized by: Volanda Napoleon, PA-C   Consent:    Consent obtained:  Verbal   Consent given by:  Patient   Risks discussed:  Bleeding, incomplete drainage, pain and damage to other organs   Alternatives discussed:  No treatment Universal protocol:    Procedure explained and questions answered to patient or proxy's satisfaction: yes     Relevant documents present and verified: yes     Test results available and properly labeled: yes     Imaging studies available: yes     Required blood products, implants, devices, and special equipment available: yes     Site/side marked: yes     Immediately prior to procedure a time out was called: yes     Patient identity confirmed:  Verbally with patient Location:    Type:  Abscess   Location:  Lower extremity   Lower extremity location:  Foot   Foot location:  R foot Pre-procedure details:    Skin preparation:  Betadine Anesthesia (see MAR for exact dosages):    Anesthesia method:  Local infiltration   Local anesthetic:  Lidocaine 1% w/o epi Procedure type:    Complexity:  Complex Procedure details:    Incision types:  Single straight   Incision depth:  Subcutaneous   Scalpel blade:  11   Wound management:  Probed and deloculated, irrigated  with saline and extensive cleaning   Drainage:  Purulent   Drainage amount:  Copious   Wound treatment:  Drain placed   Packing materials:  1/4 in gauze Post-procedure details:    Patient tolerance of procedure:  Tolerated well, no immediate complications    (including critical care time)  Medications Ordered in ED Medications  lidocaine (PF) (XYLOCAINE) 1 % injection 10 mL (10 mLs Intradermal Given by Other 08/16/19 2134)  oxyCODONE-acetaminophen (PERCOCET/ROXICET) 5-325 MG per tablet 1 tablet (1 tablet Oral Given 08/16/19 2213)  doxycycline (VIBRA-TABS) tablet 100 mg (100 mg Oral Given 08/16/19 2213)    ED Course  I have reviewed the triage vital signs and the nursing notes.  Pertinent labs & imaging results that were available during my care of the patient were reviewed by me and considered in my medical decision making (see chart for details).    MDM Rules/Calculators/A&P                      39 year old male who presents for evaluation of right foot, pain, swelling.  He thinks maybe a spider bit him but states he did not see one actually bite him.  No fevers.  On initial arrival, he is afebrile, nontoxic-appearing.  Vital signs are stable.  Right lower extremity is warm, erythematous on the dorsal aspect of the foot.  He has a central area of fluctuance that is about 3 cm in length.  The warmth, erythema begins at the metatarsal region and extends on the dorsal aspect of the foot.  It does not cross over the ankle joint.  Right lower extremity slightly more swollen than left.  No systemic signs of illness. Patient is afebrile, non-toxic appearing, sitting comfortably on examination table. Vital signs reviewed and stable. Patient is neurovascularly intact.  Consider cellulitis with abscess.  Also concern for DVT given asymmetry.  History/physical exam not concerning for septic arthritis, ischemic limb.  Plan to check labs and imaging.  CBC shows no leukocytosis.  Hemoglobin stable.  CMP shows normal BUN creatinine.  Lactic is normal.  X-ray shows no acute bony abnormality.  DVT study is negative.  Plan to I&D abscess. Discussed patient with Dr. Jacqulyn Bath who is agreeable to plan.   I&D is performed above.  Patient with copious amount of purulent  drainage.  The wound was thoroughly and extensively irrigated sterile saline.  The abscess was packed.  Reevaluation of distal pulses intact after procedure.  His exam is consistent with surrounding cellulitis.  At this time, his work-up is not concerning for systemic illness.  We will plan to put him on doxycycline.  Patient with no known drug allergies.  I instructed him on at home supportive care measures.  Instructed him that he needs to return and have the wound rechecked in 2 days. At this time, patient exhibits no emergent life-threatening condition that require further evaluation in ED or admission. Patient had ample opportunity for questions and discussion. All patient's questions were answered with full understanding. Strict return precautions discussed. Patient expresses understanding and agreement to plan.    Portions of this note were generated with Scientist, clinical (histocompatibility and immunogenetics). Dictation errors may occur despite best attempts at proofreading.   Final Clinical Impression(s) / ED Diagnoses Final diagnoses:  Cellulitis of right lower extremity  Abscess    Rx / DC Orders ED Discharge Orders         Ordered    doxycycline (VIBRAMYCIN) 100 MG capsule  2 times daily  08/16/19 2207           Maxwell Caul, PA-C 08/16/19 2220    Maia Plan, MD 08/17/19 1434

## 2019-08-16 NOTE — ED Notes (Signed)
Patient verbalizes understanding of discharge instructions. Opportunity for questioning and answers were provided. Armband removed by staff, pt [facility] ED to jail in police custody, ambulatory, a&ox4.

## 2019-08-16 NOTE — Progress Notes (Signed)
Right lower extremity venous duplex complete.  Please see CV Proc tab for preliminary results.  Preliminary results given to PA @ 18:50. Levin Bacon- RDMS, RVT 7:47 PM  08/16/2019

## 2019-08-16 NOTE — ED Triage Notes (Signed)
Patient escorted by [employer] c/o right foot swelling and pain. Believes it is from a spider bite two days ago. Foot is red and warm to touch.

## 2019-08-18 NOTE — ED Provider Notes (Signed)
I was asked by nursing staff to re-print discharge papers.  Chart review shows that it appears at the time of discharge patient was given doxycycline 100mg  BID for 7 days and he reports he lost the rx.  As he has been out of the system for over two hours unable to electronically send new rx.   I called in identical rx to [facility] on [address] as that is the requested pharmacy.    Discharge papers were re-printed.   I was required to "break the glass" to complete this request.    [id number], PA-C 08/18/19 1128    08/20/19, MD 08/18/19 2045

## 2019-09-22 ENCOUNTER — Ambulatory Visit: Payer: Self-pay | Attending: Internal Medicine

## 2019-09-22 DIAGNOSIS — Z23 Encounter for immunization: Secondary | ICD-10-CM

## 2019-09-22 NOTE — Progress Notes (Signed)
   Covid-19 Vaccination Clinic  Name:  Joseph [name]    MRN: [id number] DOB: 06/17/1981  09/22/2019  [name] was observed post Covid-19 immunization for 15 minutes without incident. He was provided with Vaccine Information Sheet and instruction to access the V-Safe system.   [name] was instructed to call 911 with any severe reactions post vaccine: Marland Kitchen Difficulty breathing  . Swelling of face and throat  . A fast heartbeat  . A bad rash all over body  . Dizziness and weakness   Immunizations Administered    Name Date Dose VIS Date Route   Pfizer COVID-19 Vaccine 09/22/2019  2:09 PM 0.3 mL 06/03/2019 Intramuscular   Manufacturer: [employer], [employer]   Lot: [id number]   NDC: [address]-7

## 2019-10-17 ENCOUNTER — Ambulatory Visit: Payer: Self-pay | Attending: Internal Medicine

## 2019-10-17 DIAGNOSIS — Z23 Encounter for immunization: Secondary | ICD-10-CM

## 2019-10-17 NOTE — Progress Notes (Signed)
   Covid-19 Vaccination Clinic  Name:  Joseph [name]    MRN: [id number] DOB: 04/07/1981  10/17/2019  [name] was observed post Covid-19 immunization for 15 minutes without incident. He was provided with Vaccine Information Sheet and instruction to access the V-Safe system.   [name] was instructed to call 911 with any severe reactions post vaccine: Marland Kitchen Difficulty breathing  . Swelling of face and throat  . A fast heartbeat  . A bad rash all over body  . Dizziness and weakness   Immunizations Administered    Name Date Dose VIS Date Route   Pfizer COVID-19 Vaccine 10/17/2019 10:10 AM 0.3 mL 08/17/2018 Intramuscular   Manufacturer: [employer], [employer]   Lot: [id number]   NDC: [address]-0

## 2020-05-05 ENCOUNTER — Other Ambulatory Visit: Payer: Self-pay

## 2020-05-05 ENCOUNTER — Emergency Department (HOSPITAL_BASED_OUTPATIENT_CLINIC_OR_DEPARTMENT_OTHER): Payer: Self-pay

## 2020-05-05 ENCOUNTER — Encounter (HOSPITAL_BASED_OUTPATIENT_CLINIC_OR_DEPARTMENT_OTHER): Payer: Self-pay | Admitting: Emergency Medicine

## 2020-05-05 ENCOUNTER — Emergency Department (HOSPITAL_BASED_OUTPATIENT_CLINIC_OR_DEPARTMENT_OTHER)
Admission: EM | Admit: 2020-05-05 | Discharge: 2020-05-05 | Disposition: A | Discharge status: Home / Self Care | Payer: Self-pay | Attending: Emergency Medicine | Admitting: Emergency Medicine

## 2020-05-05 DIAGNOSIS — M2540 Effusion, unspecified joint: Secondary | ICD-10-CM | POA: Insufficient documentation

## 2020-05-05 DIAGNOSIS — F1590 Other stimulant use, unspecified, uncomplicated: Secondary | ICD-10-CM | POA: Insufficient documentation

## 2020-05-05 DIAGNOSIS — J45909 Unspecified asthma, uncomplicated: Secondary | ICD-10-CM | POA: Insufficient documentation

## 2020-05-05 DIAGNOSIS — F17210 Nicotine dependence, cigarettes, uncomplicated: Secondary | ICD-10-CM | POA: Insufficient documentation

## 2020-05-05 DIAGNOSIS — M79672 Pain in left foot: Secondary | ICD-10-CM

## 2020-05-05 NOTE — ED Provider Notes (Signed)
[facility] EMERGENCY DEPARTMENT Provider Note   CSN: [id number] Arrival date & time: 05/05/20  1903     History Chief Complaint  Patient presents with  . Joint Swelling    Joseph [name] is a 39 y.o. male.  HPI Patient is a 39 injection IV drug use presented today with left foot pain.  He states that began today when he was at work.  He states that he may have dropped something on his left foot yesterday at work he works at [employer].   He states that he last used IV drugs approximately 1 week ago.  He states he never injects in his legs or feet.  Only injects in his arms.  He denies any fevers, chills, chest pain, shortness of breath, headache, weakness or numbness.  Denies any other significant symptoms.  He states that he was like a come to the ER but he told his shift manager about his foot pain and was told to go to the ER.  He states that he will need a work note to validate that he was here in the ER.  He is not having any trouble walking.  He states that he is primarily worried because of some swelling that he has noticed.  He has had swelling in his foot in the past.  He states that he has a compression sock for this.  Denies any history of CHF or kidney issues.  No other associated symptoms.  No aggravating or mitigating factors.  States the pain is very minimal.  Described as achy.     Past Medical History:  Diagnosis Date  . Anxiety   . Asthma    as a child  . Depression   . Narcotic abuse [facility])     Patient Active Problem List   Diagnosis Date Noted  . Abscess 03/31/2018  . Thrombocytopenia (HCC) 03/31/2018  . Depression 03/31/2018  . Abscess of hand 03/31/2018  . Abscess of right hand 03/31/2018  . GAD (generalized anxiety disorder) 12/04/2014  . Alcohol use disorder, severe, dependence (HCC) 12/03/2014  . 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 Cocaine use  disorder, severe, dependence (HCC) 12/03/2014  . Cannabis use disorder, severe, dependence (HCC) 12/03/2014  . Abscess of right forearm 03/24/2012    Past Surgical History:  Procedure Laterality Date  . CLOSED REDUCTION FINGER WITH PERCUTANEOUS PINNING Right 06/22/2013   Procedure: RIGHT HAND SMALL FINGER CLOSED MANIPULATION AND PINNING;  Surgeon: Sharma Covert, MD;  Location: [facility] OR;  Service: Orthopedics;  Laterality: Right;  . I & D EXTREMITY  03/24/2012   Procedure: IRRIGATION AND DEBRIDEMENT EXTREMITY;  Surgeon: Eldred Manges, MD;  Location: [facility] OR;  Service: Orthopedics;  Laterality: Right;  Right Forearm       Family History  Problem Relation Age of Onset  . Other Other   . Alcohol abuse Other     Social History   Tobacco Use  . Smoking status: Current Every Day Smoker    Packs/day: 1.00    Years: 10.00    Pack years: 10.00    Types: Cigarettes  . Smokeless tobacco: Never Used  Substance Use Topics  . Alcohol use: Yes    Alcohol/week: 12.0 standard drinks    Types: 12 Cans of beer per week  . Drug use: Yes    Types: Marijuana    Comment: hx of substance abuse    Home Medications Prior to  Admission medications   Medication Sig Start Date End Date Taking? Authorizing Provider  acetaminophen (TYLENOL) 500 MG tablet Take 1,000 mg by mouth every 6 (six) hours as needed for mild pain.     [provider]    Allergies    Patient has no known allergies.  Review of Systems   Review of Systems  Constitutional: Negative for chills and fever.  HENT: Negative for congestion.   Respiratory: Negative for shortness of breath.   Cardiovascular: Negative for chest pain.  Gastrointestinal: Negative for abdominal pain.  Musculoskeletal: Negative for neck pain.       Foot pain    Physical Exam Updated Vital Signs BP (!) 156/95 (BP Location: Left Arm)   Pulse 93   Temp 98.5 F (36.9 C) (Oral)   Resp 18   Ht 5\' 9"  (1.753 m)   Wt 117.9 kg   SpO2 100%   BMI  38.40 kg/m   Physical Exam Vitals and nursing note reviewed.  Constitutional:      General: He is not in acute distress.    Appearance: Normal appearance. He is obese. He is not ill-appearing.     Comments: Pleasant well-appearing 39 year old.  In no acute distress.  Sitting comfortably in bed.  Able answer questions appropriately follow commands. No increased work of breathing. Speaking in full sentences.  HENT:     Head: Normocephalic and atraumatic.     Mouth/Throat:     Mouth: Mucous membranes are moist.  Eyes:     General: No scleral icterus.       Right eye: No discharge.        Left eye: No discharge.     Conjunctiva/sclera: Conjunctivae normal.  Cardiovascular:     Comments: Regular rate and rhythm.  No murmurs rubs or gallops. Pulmonary:     Effort: Pulmonary effort is normal.     Breath sounds: No stridor.  Musculoskeletal:     Comments: No significant tenderness over the left ankle or midfoot.  Specifically there is no posterior, medial or lateral ankle tenderness to palpation.  Full range of motion and good strength in bilateral lower extremities.  There does not appear to be any significant swelling in the left foot although patient is endorsing this.  No lower extremity pitting edema.  Skin:    General: Skin is warm and dry.     Capillary Refill: Capillary refill takes less than 2 seconds.  Neurological:     Mental Status: He is alert and oriented to person, place, and time. Mental status is at baseline.     ED Results / Procedures / Treatments   Labs (all labs ordered are listed, but only abnormal results are displayed) Labs Reviewed - No data to display  EKG None  Radiology No results found.  Procedures Procedures (including critical care time)  Medications Ordered in ED Medications - No data to display  ED Course  I have reviewed the triage vital signs and the nursing notes.  Pertinent labs & imaging results that were available during my care  of the patient were reviewed by me and considered in my medical decision making (see chart for details).    MDM Rules/Calculators/A&P                          Patient is a 39 year old male with past medical history significant for IV drug use see HPI for full details.  Presenting today with left foot pain  that began this morning.  Has been more or less constant since he states it is minimal and at times he forgets that it is been hurting however he states that he noticed a small amount of swelling and was concerned enough to come to the ER for evaluation.  Physical exam is notably unremarkable.  He has good peripheral pulses DP PT.  I have low suspicion for this being an arterial occlusive disease.  Low suspicion for gout as well as his pain is very minimal.  I do not appreciate significant swelling in his foot. He has had an abscess in the same area before however I do not appreciate any fluctuance there is no areas of erythema or warmth.  Is good sensation and movement toes and foot.  X-ray without any acute abnormalities.  He may have questionably dropped something on his foot yesterday but does not quite remember it.  I have very low suspicion for occult identified fracture.  We will discharge with rice therapy.  Will follow up with PCP.  Also given Tylenol and ibuprofen recommendations and work note as requested.  We will continue to use his compression stocking.  Final Clinical Impression(s) / ED Diagnoses Final diagnoses:  Left foot pain    Rx / DC Orders ED Discharge Orders    None       Gailen Shelter, [place] 05/05/20 2107    Maia Plan, MD 05/05/20 2300

## 2020-05-05 NOTE — Discharge Instructions (Addendum)
The x-ray of your foot is without any acute abnormality.  Please rest ice and elevate your foot.  Wear compression sock.  Please follow-up with your primary care doctor.  You may return to the ER for new or concerning symptoms.  Please use Tylenol or ibuprofen for pain.  You may use 600 mg ibuprofen every 6 hours or 1000 mg of Tylenol every 6 hours.  You may choose to alternate between the 2.  This would be most effective.  Not to exceed 4 g of Tylenol within 24 hours.  Not to exceed 3200 mg ibuprofen 24 hours.

## 2020-05-05 NOTE — ED Triage Notes (Signed)
Reports left ankle swelling for the last day or so.  Denies any pain.  Ambulatory in triage.

## 2020-06-27 ENCOUNTER — Encounter (HOSPITAL_BASED_OUTPATIENT_CLINIC_OR_DEPARTMENT_OTHER): Payer: Self-pay

## 2020-06-27 ENCOUNTER — Other Ambulatory Visit: Payer: Self-pay

## 2020-06-27 ENCOUNTER — Emergency Department (HOSPITAL_BASED_OUTPATIENT_CLINIC_OR_DEPARTMENT_OTHER)
Admission: EM | Admit: 2020-06-27 | Discharge: 2020-06-27 | Disposition: A | Discharge status: Home / Self Care | Payer: Self-pay | Attending: Emergency Medicine | Admitting: Emergency Medicine

## 2020-06-27 DIAGNOSIS — S80862A Insect bite (nonvenomous), left lower leg, initial encounter: Secondary | ICD-10-CM

## 2020-06-27 DIAGNOSIS — X58XXXA Exposure to other specified factors, initial encounter: Secondary | ICD-10-CM | POA: Insufficient documentation

## 2020-06-27 DIAGNOSIS — F17210 Nicotine dependence, cigarettes, uncomplicated: Secondary | ICD-10-CM | POA: Insufficient documentation

## 2020-06-27 DIAGNOSIS — J45909 Unspecified asthma, uncomplicated: Secondary | ICD-10-CM | POA: Insufficient documentation

## 2020-06-27 DIAGNOSIS — S70362A Insect bite (nonvenomous), left thigh, initial encounter: Secondary | ICD-10-CM | POA: Insufficient documentation

## 2020-06-27 DIAGNOSIS — W57XXXA Bitten or stung by nonvenomous insect and other nonvenomous arthropods, initial encounter: Secondary | ICD-10-CM

## 2020-06-27 MED ORDER — CEPHALEXIN 500 MG PO CAPS: 500.0000 mg | ORAL_CAPSULE | Freq: Two times a day (BID) | ORAL | 0 refills | Status: AC

## 2020-06-27 NOTE — ED Notes (Signed)
Insect bite to left lateral thigh area, area is marked with skin marker, noted to be approx 18cm x 15cm with redness area noted, center area is measured at approx 1 cm of red area with sm amt of pus like drainage is noted. Pt states he noted this yesterday, thinks it was a spider bite. States it feels warm to touch, very sensitive, very sore is how pt describes area. Denies having fevers, but did experience some nausea as well.

## 2020-06-27 NOTE — ED Triage Notes (Signed)
Pt feels he may have insect bite yesterday-swelling/redness to left thigh-NAD-steady gait

## 2020-06-27 NOTE — Discharge Instructions (Signed)
    You are seen today for an insect bite, I think that this is starting to become infectious with some surrounding cellulitis.  I want you to take the antibiotics as directed.  Please use the attached instructions.  If you start developing a fever, if you start noticing that the area of redness extends beyond the borders that are marked, if you notice any new or worsening concerning symptoms please come back to the emergency department.  Continue to take Tylenol as directed on the bottle for pain.  Please take the antibiotics in full, stay hydrated.  Please speak to your pharmacist today about any new medications prescribed side effects or interactions.  Please have this rechecked in the next couple of days by primary care provider or urgent care, you can also come here.   Get help right away if: You develop shortness of breath or chest pain. You have fluid, blood, or pus coming from the bite area. You have muscle cramps or painful muscle spasms. You develop abdominal pain, nausea, or vomiting. You feel unusually tired (fatigued) or sleepy.

## 2020-06-27 NOTE — ED Provider Notes (Signed)
[facility] EMERGENCY DEPARTMENT Provider Note   CSN: [id number] Arrival date & time: 06/27/20  1313     History Chief Complaint  Patient presents with  . Insect Bite    Joseph [name] is a 40 y.o. male with past medical history of anxiety, asthma, narcotic abuse that presents to the emergency department today for insect bite.  Patient states that he thinks he had an insect bite yesterday and is now noticed that there has been some swelling and redness to his left thigh.  Patient states that the center of the area has started draining.  Denies any fevers or chills, does admit to some nausea, not currently nauseous.  Patient states that he thinks that it was a spider, felt that an insect had bitten him.  States that he was inside his house when it occurred.  Denies any recent antibiotics.  Denies any history of diabetes or immunocompromise state.  States that is been able to walk without any difficulty.  Denies any numbness or tingling.  Denies any weakness.  No chest pain, shortness of breath, abdominal pain or muscle spasms.  States he was in his normal health before this.  Has not taken anything for this.  No other complaints.  HPI     Past Medical History:  Diagnosis Date  . Anxiety   . Asthma    as a child  . Depression   . Narcotic abuse [facility])     Patient Active Problem List   Diagnosis Date Noted  . Abscess 03/31/2018  . Thrombocytopenia (HCC) 03/31/2018  . Depression 03/31/2018  . Abscess of hand 03/31/2018  . Abscess of right hand 03/31/2018  . GAD (generalized anxiety disorder) 12/04/2014  . Alcohol use disorder, severe, dependence (HCC) 12/03/2014  . 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 Cocaine use disorder, severe, dependence (HCC) 12/03/2014  . Cannabis use disorder, severe, dependence (HCC) 12/03/2014  . Abscess of right forearm 03/24/2012    Past Surgical  History:  Procedure Laterality Date  . CLOSED REDUCTION FINGER WITH PERCUTANEOUS PINNING Right 06/22/2013   Procedure: RIGHT HAND SMALL FINGER CLOSED MANIPULATION AND PINNING;  Surgeon: Sharma Covert, MD;  Location: [facility] OR;  Service: Orthopedics;  Laterality: Right;  . I & D EXTREMITY  03/24/2012   Procedure: IRRIGATION AND DEBRIDEMENT EXTREMITY;  Surgeon: Eldred Manges, MD;  Location: [facility] OR;  Service: Orthopedics;  Laterality: Right;  Right Forearm       Family History  Problem Relation Age of Onset  . Other Other   . Alcohol abuse Other     Social History   Tobacco Use  . Smoking status: Current Every Day Smoker    Packs/day: 1.00    Years: 10.00    Pack years: 10.00    Types: Cigarettes  . Smokeless tobacco: Never Used  Substance Use Topics  . Alcohol use: Not Currently  . Drug use: Yes    Types: Marijuana    Comment: =    Home Medications Prior to Admission medications   Medication Sig Start Date End Date Taking? Authorizing Provider  cephALEXin (KEFLEX) 500 MG capsule Take 1 capsule (500 mg total) by mouth 2 (two) times daily for 7 days. 06/27/20 07/04/20 Yes Hazael Olveda, [place]ne Anon, PA-C  acetaminophen (TYLENOL) 500 MG tablet Take 1,000 mg by mouth every 6 (six) hours as needed for mild pain.     [provider]    Allergies  Patient has no known allergies.  Review of Systems   Review of Systems  Constitutional: Negative for diaphoresis, fatigue and fever.  Eyes: Negative for visual disturbance.  Respiratory: Negative for shortness of breath.   Cardiovascular: Negative for chest pain.  Gastrointestinal: Negative for nausea and vomiting.  Musculoskeletal: Negative for back pain and myalgias.  Skin: Positive for wound. Negative for color change, pallor and rash.  Neurological: Negative for syncope, weakness, light-headedness, numbness and headaches.  Psychiatric/Behavioral: Negative for behavioral problems and confusion.    Physical Exam Updated Vital Signs BP  129/73 (BP Location: Left Arm)   Pulse 96   Temp 98 F (36.7 C) (Oral)   Resp 18   Ht 5\' 11"  (1.803 m)   Wt 131.5 kg   SpO2 95%   BMI 40.45 kg/m   Physical Exam Constitutional:      General: He is not in acute distress.    Appearance: Normal appearance. He is not ill-appearing, toxic-appearing or diaphoretic.  Cardiovascular:     Rate and Rhythm: Normal rate and regular rhythm.     Pulses: Normal pulses.  Pulmonary:     Effort: Pulmonary effort is normal. No respiratory distress.     Breath sounds: Normal breath sounds. No stridor.  Abdominal:     General: Abdomen is flat. There is no distension.     Palpations: Abdomen is soft.     Tenderness: There is no abdominal tenderness.  Musculoskeletal:        General: Normal range of motion.  Skin:    General: Skin is warm and dry.     Capillary Refill: Capillary refill takes less than 2 seconds.     Comments: Patient with area of redness about 18cm x 15cm to left lateral thigh, Warmth and tenderness more toward the middle 3cm,there is a 1 cm area where patient did have insect bite with small amount of drainage coming from this area.  No true fluctuance. No areas of necrosis.  No streaking noted.  Neurological:     General: No focal deficit present.     Mental Status: He is alert and oriented to person, place, and time.     Comments: Patient with normal strength and sensation to left lower extremity.  Normal gait.  PT pulses 2+.  Psychiatric:        Mood and Affect: Mood normal.        Behavior: Behavior normal.        Thought Content: Thought content normal.     ED Results / Procedures / Treatments   Labs (all labs ordered are listed, but only abnormal results are displayed) Labs Reviewed - No data to display  EKG None  Radiology No results found.  Procedures Procedures (including critical care time)  Medications Ordered in ED Medications - No data to display  ED Course  I have reviewed the triage vital signs and  the nursing notes.  Pertinent labs & imaging results that were available during my care of the patient were reviewed by me and considered in my medical decision making (see chart for details).    MDM Rules/Calculators/A&P                          LARNELL GRANLUND is a 40 y.o. male with past medical history of anxiety, asthma, narcotic abuse that presents to the emergency department today for insect bite. Pt appears very well, no acute distress. Patient states that he did  feel bite, area does appear as if there is cellulitis around this area. No streaking. No abscess, area is draining.  No area of necrosis.  Patient afebrile, nontachycardic.  Patient is distally neurovascularly intact.  Patient is not immunocompromise.  Will give antibiotics of have patient follow-up with PCP.  Did also mark this area and expressed to patient strict return precautions.  Patient expressed understanding.  Doubt need for further emergent work up at this time. I explained the diagnosis and have given explicit precautions to return to the ER including for any other new or worsening symptoms. The patient understands and accepts the medical plan as it's been dictated and I have answered their questions. Discharge instructions concerning home care and prescriptions have been given. The patient is STABLE and is discharged to home in good condition.  Final Clinical Impression(s) / ED Diagnoses Final diagnoses:  Insect bite of left lower leg, initial encounter    Rx / DC Orders ED Discharge Orders         Ordered    cephALEXin (KEFLEX) 500 MG capsule  2 times daily        06/27/20 [place], [place], PA-C 06/27/20 1712    Quintella Reichert, MD 06/27/20 [phone]

## 2022-05-22 IMAGING — CR DG FOOT COMPLETE 3+V*L*
3 series · 3 of 3 positions shown · non-contrast
Comparison: The foot radiograph dated 10/20/2010.

CLINICAL DATA: 39-year-old male with pain in the left foot.

EXAM:
LEFT FOOT - COMPLETE 3+ VIEW

[t foot ap left *]
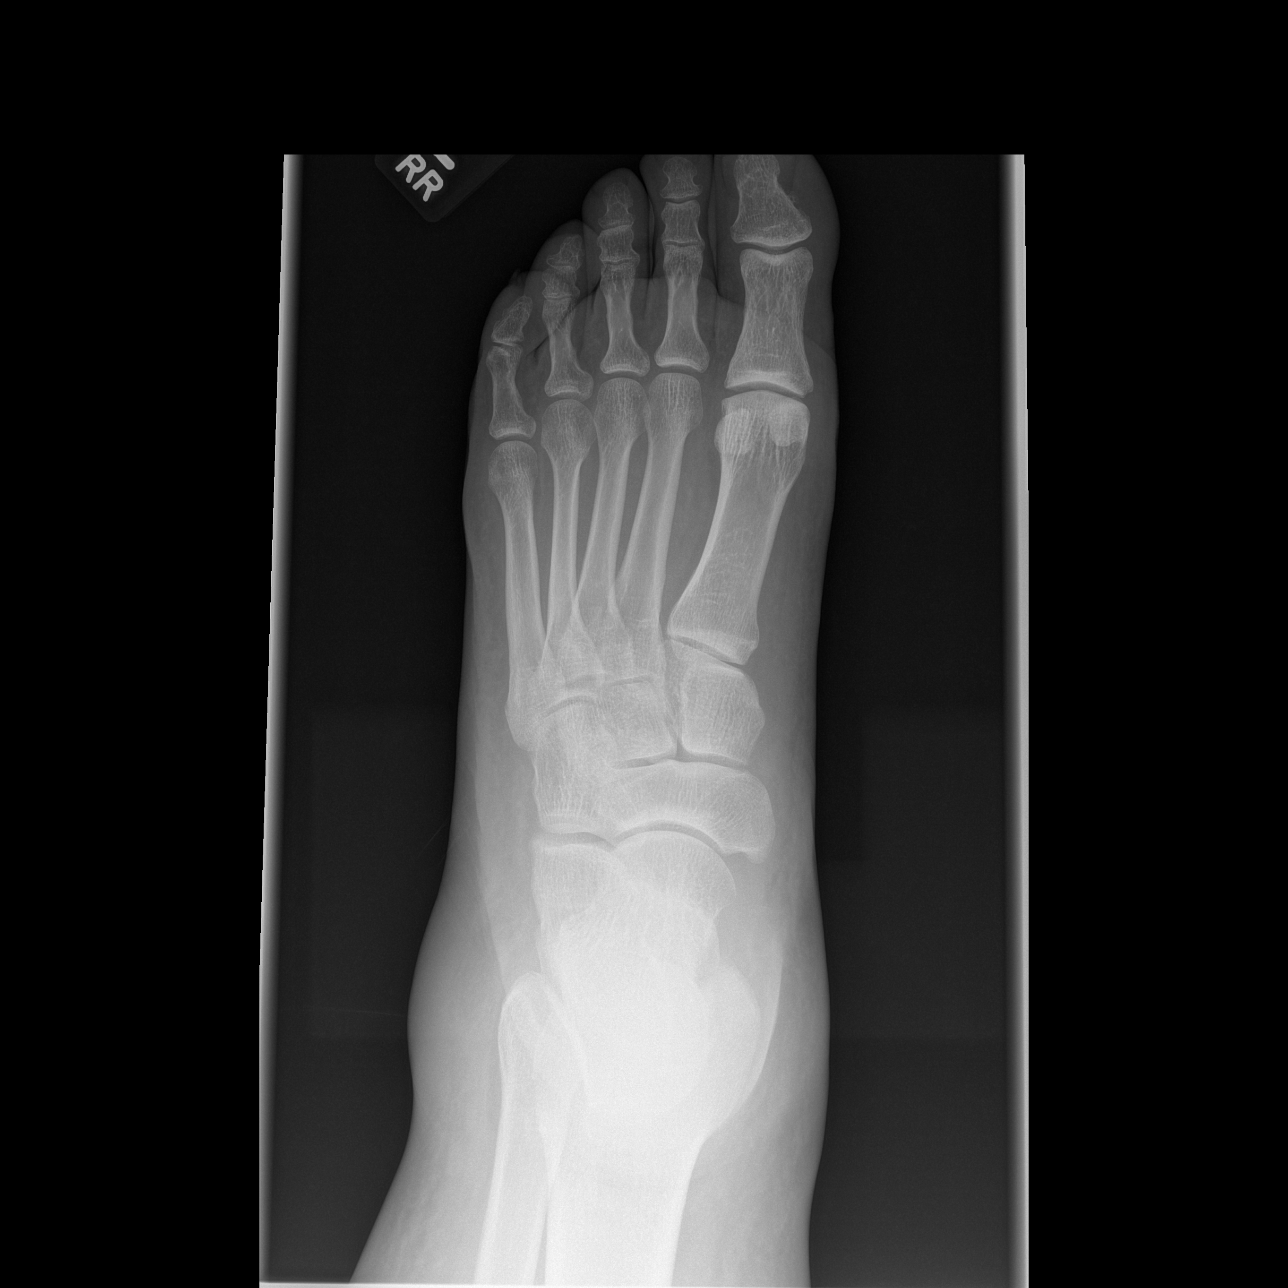

[t foot oblique left *]
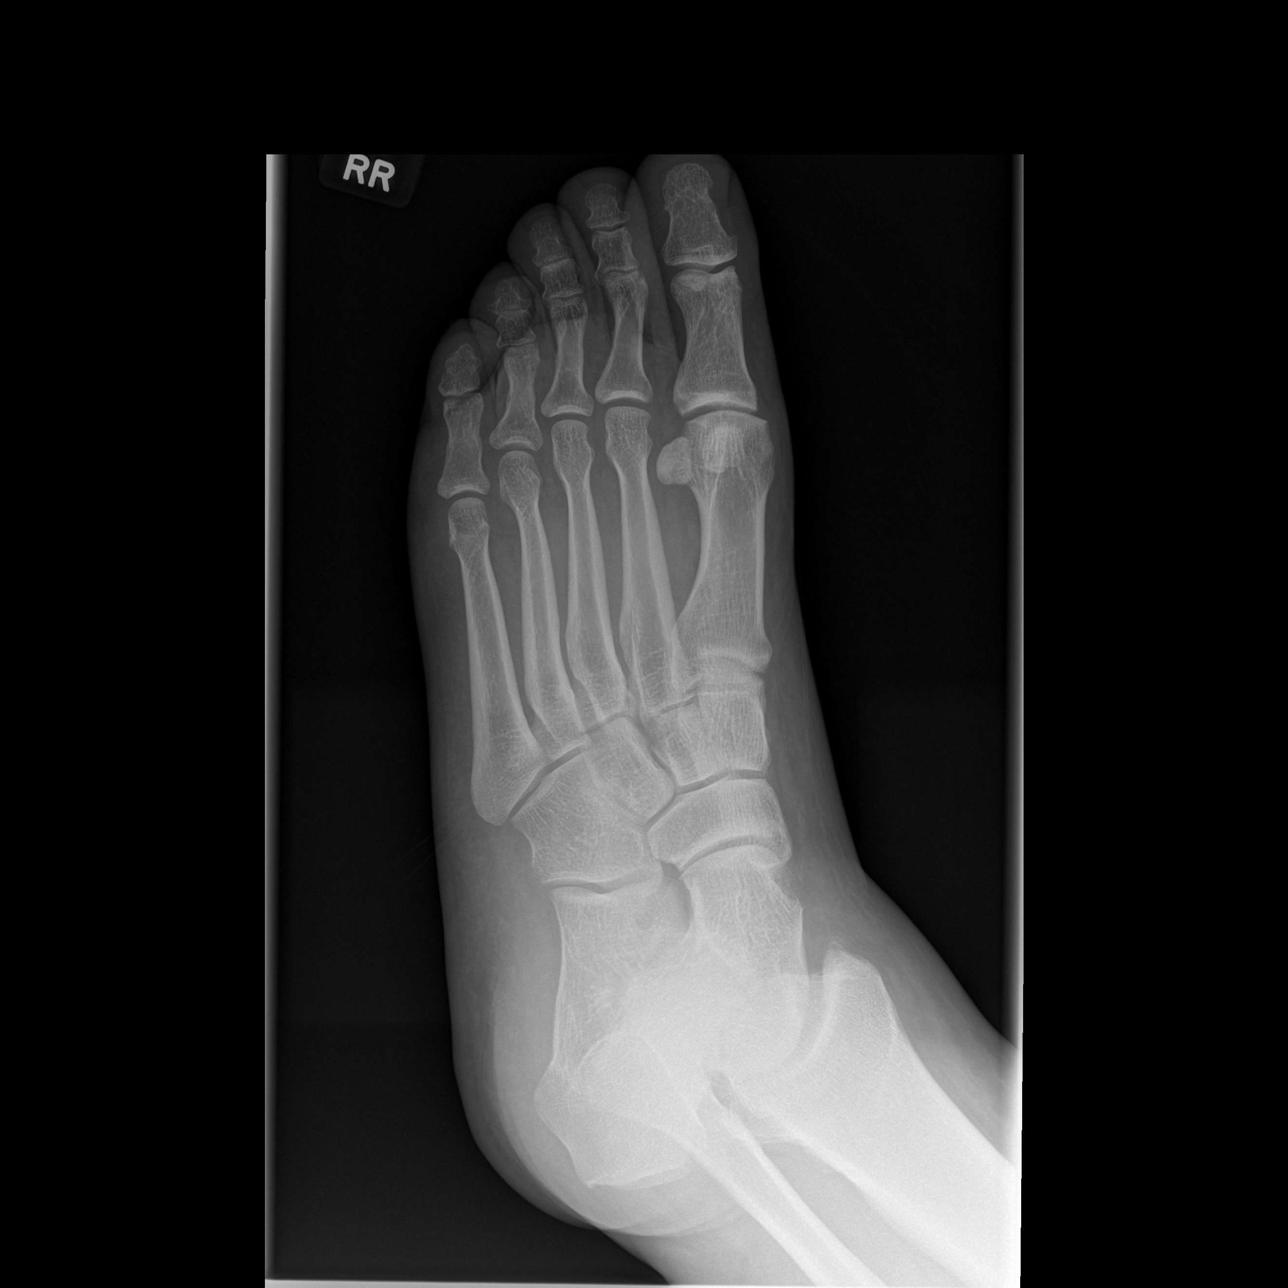

[t foot lat left *]
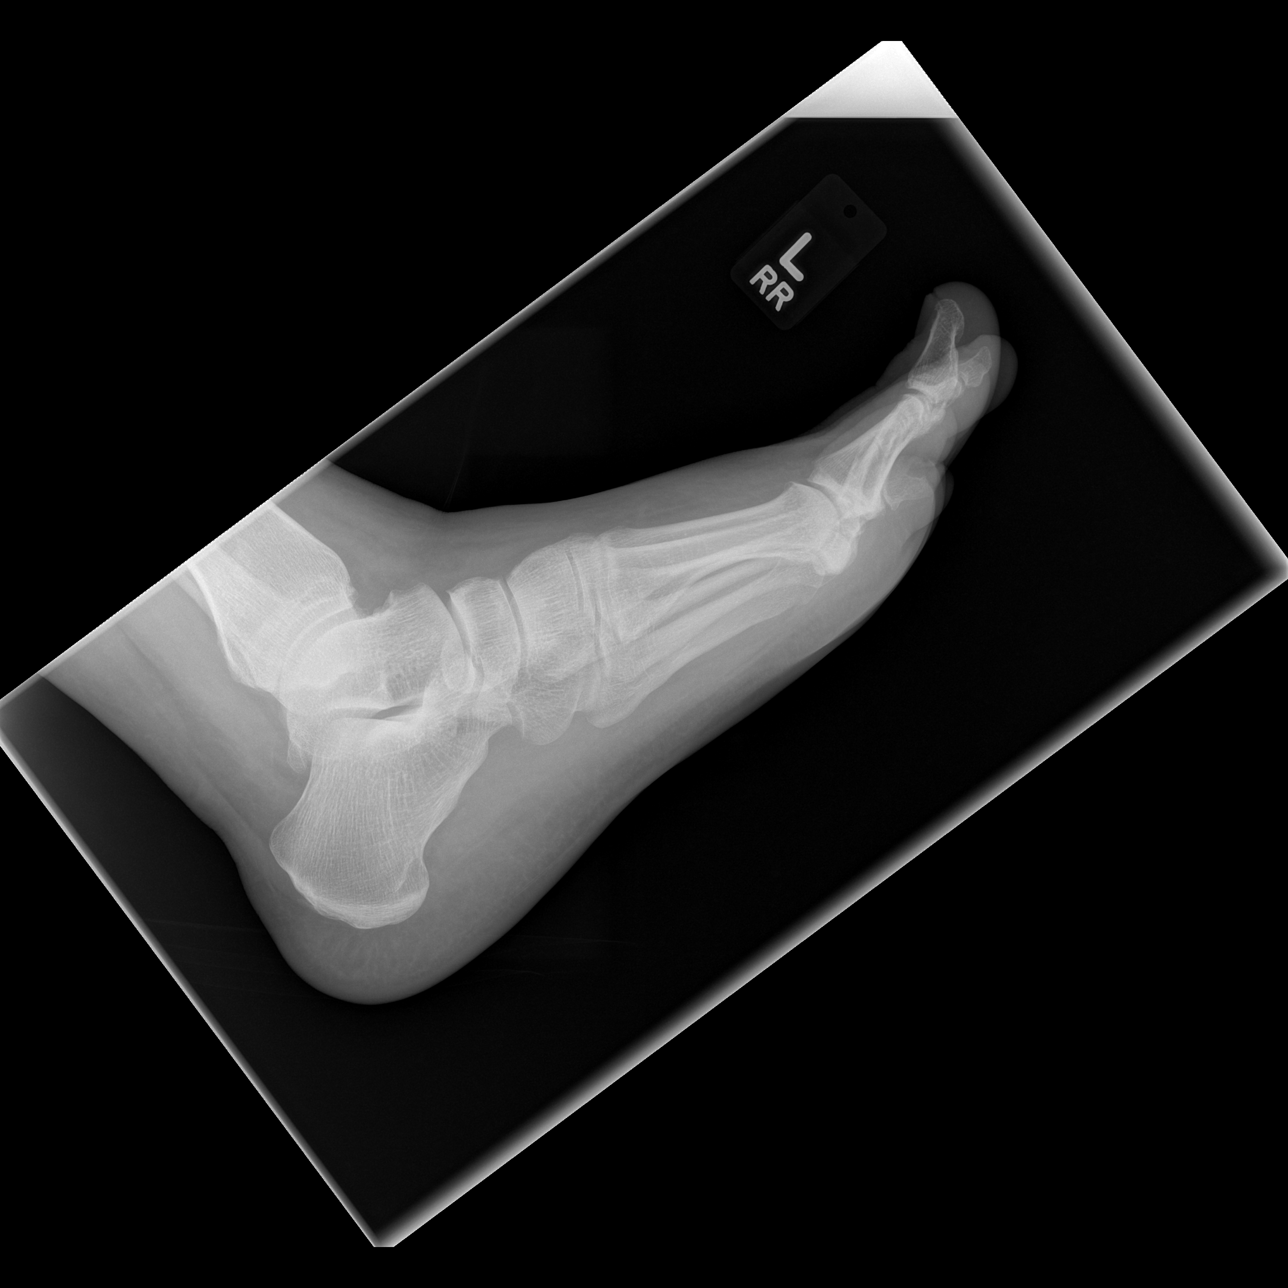

[3 of 3 positions shown; findings below may reference images not displayed]

FINDINGS: There is no evidence of fracture or dislocation. There is no
evidence of arthropathy or other focal bone abnormality. Soft
tissues are unremarkable.
IMPRESSION: Negative.

## 2024-04-24 ENCOUNTER — Other Ambulatory Visit (HOSPITAL_COMMUNITY): Admission: RE | Admit: 2024-04-24 | Payer: MEDICAID | Source: Ambulatory Visit

## 2024-04-27 ENCOUNTER — Encounter (HOSPITAL_BASED_OUTPATIENT_CLINIC_OR_DEPARTMENT_OTHER): Payer: Self-pay | Admitting: Emergency Medicine

## 2024-04-27 ENCOUNTER — Inpatient Hospital Stay (HOSPITAL_BASED_OUTPATIENT_CLINIC_OR_DEPARTMENT_OTHER)
Admission: EM | Admit: 2024-04-27 | Discharge: 2024-05-16 | DRG: 426 | Disposition: A | Discharge status: Rehabilitation Facility | Payer: MEDICAID | Attending: Emergency Medicine | Admitting: Emergency Medicine

## 2024-04-27 ENCOUNTER — Emergency Department (HOSPITAL_BASED_OUTPATIENT_CLINIC_OR_DEPARTMENT_OTHER): Payer: MEDICAID

## 2024-04-27 ENCOUNTER — Other Ambulatory Visit: Payer: Self-pay

## 2024-04-27 DIAGNOSIS — S22009A Unspecified fracture of unspecified thoracic vertebra, initial encounter for closed fracture: Secondary | ICD-10-CM | POA: Diagnosis present

## 2024-04-27 DIAGNOSIS — R079 Chest pain, unspecified: Secondary | ICD-10-CM

## 2024-04-27 DIAGNOSIS — Z95828 Presence of other vascular implants and grafts: Secondary | ICD-10-CM

## 2024-04-27 DIAGNOSIS — R946 Abnormal results of thyroid function studies: Secondary | ICD-10-CM | POA: Insufficient documentation

## 2024-04-27 DIAGNOSIS — I2693 Single subsegmental pulmonary embolism without acute cor pulmonale: Secondary | ICD-10-CM | POA: Diagnosis present

## 2024-04-27 DIAGNOSIS — R292 Abnormal reflex: Secondary | ICD-10-CM | POA: Diagnosis present

## 2024-04-27 DIAGNOSIS — M5106 Intervertebral disc disorders with myelopathy, lumbar region: Secondary | ICD-10-CM | POA: Diagnosis present

## 2024-04-27 DIAGNOSIS — R739 Hyperglycemia, unspecified: Secondary | ICD-10-CM

## 2024-04-27 DIAGNOSIS — M25511 Pain in right shoulder: Secondary | ICD-10-CM | POA: Diagnosis present

## 2024-04-27 DIAGNOSIS — C801 Malignant (primary) neoplasm, unspecified: Secondary | ICD-10-CM

## 2024-04-27 DIAGNOSIS — M545 Low back pain, unspecified: Principal | ICD-10-CM | POA: Insufficient documentation

## 2024-04-27 DIAGNOSIS — R918 Other nonspecific abnormal finding of lung field: Secondary | ICD-10-CM | POA: Insufficient documentation

## 2024-04-27 DIAGNOSIS — D62 Acute posthemorrhagic anemia: Secondary | ICD-10-CM | POA: Diagnosis not present

## 2024-04-27 DIAGNOSIS — C7801 Secondary malignant neoplasm of right lung: Secondary | ICD-10-CM | POA: Diagnosis present

## 2024-04-27 DIAGNOSIS — K8020 Calculus of gallbladder without cholecystitis without obstruction: Secondary | ICD-10-CM | POA: Diagnosis present

## 2024-04-27 DIAGNOSIS — I2721 Secondary pulmonary arterial hypertension: Secondary | ICD-10-CM | POA: Diagnosis present

## 2024-04-27 DIAGNOSIS — R270 Ataxia, unspecified: Secondary | ICD-10-CM

## 2024-04-27 DIAGNOSIS — D72829 Elevated white blood cell count, unspecified: Secondary | ICD-10-CM | POA: Diagnosis present

## 2024-04-27 DIAGNOSIS — Z7901 Long term (current) use of anticoagulants: Secondary | ICD-10-CM

## 2024-04-27 DIAGNOSIS — E041 Nontoxic single thyroid nodule: Secondary | ICD-10-CM | POA: Insufficient documentation

## 2024-04-27 DIAGNOSIS — E66813 Obesity, class 3: Secondary | ICD-10-CM | POA: Diagnosis present

## 2024-04-27 DIAGNOSIS — F32A Depression, unspecified: Secondary | ICD-10-CM | POA: Diagnosis present

## 2024-04-27 DIAGNOSIS — S24102A Unspecified injury at T2-T6 level of thoracic spinal cord, initial encounter: Principal | ICD-10-CM | POA: Diagnosis present

## 2024-04-27 DIAGNOSIS — K5903 Drug induced constipation: Secondary | ICD-10-CM | POA: Diagnosis not present

## 2024-04-27 DIAGNOSIS — D696 Thrombocytopenia, unspecified: Secondary | ICD-10-CM | POA: Diagnosis not present

## 2024-04-27 DIAGNOSIS — Z7409 Other reduced mobility: Secondary | ICD-10-CM | POA: Diagnosis present

## 2024-04-27 DIAGNOSIS — I493 Ventricular premature depolarization: Secondary | ICD-10-CM | POA: Diagnosis not present

## 2024-04-27 DIAGNOSIS — Z85850 Personal history of malignant neoplasm of thyroid: Secondary | ICD-10-CM

## 2024-04-27 DIAGNOSIS — I2699 Other pulmonary embolism without acute cor pulmonale: Secondary | ICD-10-CM | POA: Diagnosis present

## 2024-04-27 DIAGNOSIS — M4804 Spinal stenosis, thoracic region: Secondary | ICD-10-CM | POA: Diagnosis present

## 2024-04-27 DIAGNOSIS — F1110 Opioid abuse, uncomplicated: Secondary | ICD-10-CM | POA: Diagnosis present

## 2024-04-27 DIAGNOSIS — M62838 Other muscle spasm: Secondary | ICD-10-CM | POA: Diagnosis present

## 2024-04-27 DIAGNOSIS — Z5901 Sheltered homelessness: Secondary | ICD-10-CM

## 2024-04-27 DIAGNOSIS — J45909 Unspecified asthma, uncomplicated: Secondary | ICD-10-CM | POA: Diagnosis present

## 2024-04-27 DIAGNOSIS — R58 Hemorrhage, not elsewhere classified: Secondary | ICD-10-CM | POA: Diagnosis present

## 2024-04-27 DIAGNOSIS — D649 Anemia, unspecified: Secondary | ICD-10-CM

## 2024-04-27 DIAGNOSIS — C7951 Secondary malignant neoplasm of bone: Secondary | ICD-10-CM | POA: Diagnosis present

## 2024-04-27 DIAGNOSIS — R600 Localized edema: Secondary | ICD-10-CM

## 2024-04-27 DIAGNOSIS — R262 Difficulty in walking, not elsewhere classified: Secondary | ICD-10-CM | POA: Diagnosis present

## 2024-04-27 DIAGNOSIS — C73 Malignant neoplasm of thyroid gland: Secondary | ICD-10-CM | POA: Diagnosis present

## 2024-04-27 DIAGNOSIS — Z9181 History of falling: Secondary | ICD-10-CM

## 2024-04-27 DIAGNOSIS — F17210 Nicotine dependence, cigarettes, uncomplicated: Secondary | ICD-10-CM | POA: Diagnosis present

## 2024-04-27 DIAGNOSIS — F1910 Other psychoactive substance abuse, uncomplicated: Secondary | ICD-10-CM | POA: Diagnosis present

## 2024-04-27 DIAGNOSIS — C7802 Secondary malignant neoplasm of left lung: Secondary | ICD-10-CM | POA: Diagnosis present

## 2024-04-27 DIAGNOSIS — R509 Fever, unspecified: Secondary | ICD-10-CM | POA: Diagnosis not present

## 2024-04-27 DIAGNOSIS — J398 Other specified diseases of upper respiratory tract: Secondary | ICD-10-CM | POA: Diagnosis present

## 2024-04-27 DIAGNOSIS — M5127 Other intervertebral disc displacement, lumbosacral region: Secondary | ICD-10-CM | POA: Diagnosis present

## 2024-04-27 DIAGNOSIS — F411 Generalized anxiety disorder: Secondary | ICD-10-CM | POA: Diagnosis present

## 2024-04-27 DIAGNOSIS — I272 Pulmonary hypertension, unspecified: Secondary | ICD-10-CM | POA: Insufficient documentation

## 2024-04-27 DIAGNOSIS — R200 Anesthesia of skin: Secondary | ICD-10-CM | POA: Diagnosis present

## 2024-04-27 DIAGNOSIS — Z515 Encounter for palliative care: Secondary | ICD-10-CM

## 2024-04-27 DIAGNOSIS — E049 Nontoxic goiter, unspecified: Secondary | ICD-10-CM

## 2024-04-27 DIAGNOSIS — Z6841 Body Mass Index (BMI) 40.0 and over, adult: Secondary | ICD-10-CM

## 2024-04-27 DIAGNOSIS — E890 Postprocedural hypothyroidism: Secondary | ICD-10-CM | POA: Diagnosis present

## 2024-04-27 DIAGNOSIS — R531 Weakness: Secondary | ICD-10-CM | POA: Diagnosis present

## 2024-04-27 DIAGNOSIS — R260 Ataxic gait: Secondary | ICD-10-CM | POA: Diagnosis present

## 2024-04-27 DIAGNOSIS — G893 Neoplasm related pain (acute) (chronic): Secondary | ICD-10-CM | POA: Diagnosis present

## 2024-04-27 DIAGNOSIS — M8458XA Pathological fracture in neoplastic disease, other specified site, initial encounter for fracture: Principal | ICD-10-CM | POA: Diagnosis present

## 2024-04-27 LAB — CBC
HCT: 38.1 % — ABNORMAL LOW (ref 39.0–52.0)
Hemoglobin: 12.3 g/dL — ABNORMAL LOW (ref 13.0–17.0)
MCH: 29.6 pg (ref 26.0–34.0)
MCHC: 32.3 g/dL (ref 30.0–36.0)
MCV: 91.6 fL (ref 80.0–100.0)
Platelets: 155 K/uL (ref 150–400)
RBC: 4.16 MIL/uL — ABNORMAL LOW (ref 4.22–5.81)
RDW: 11.9 % (ref 11.5–15.5)
WBC: 6.1 K/uL (ref 4.0–10.5)
nRBC: 0 % (ref 0.0–0.2)

## 2024-04-27 MED ORDER — OXYCODONE HCL 5 MG PO TABS
5.0000 mg | ORAL_TABLET | Freq: Once | ORAL | Status: AC
Start: 2024-04-27 — End: 2024-04-27
  Administered 2024-04-27: 5 mg via ORAL
  Filled 2024-04-27: qty 1

## 2024-04-27 MED ORDER — CYCLOBENZAPRINE HCL 10 MG PO TABS
10.0000 mg | ORAL_TABLET | Freq: Once | ORAL | Status: AC
  Administered 2024-04-27: 10 mg via ORAL
  Filled 2024-04-27: qty 1

## 2024-04-27 MED ORDER — KETOROLAC TROMETHAMINE 15 MG/ML IJ SOLN
15.0000 mg | Freq: Once | INTRAMUSCULAR | Status: AC
  Administered 2024-04-27: 15 mg via INTRAMUSCULAR
  Filled 2024-04-27: qty 1

## 2024-04-27 MED ORDER — ACETAMINOPHEN 500 MG PO TABS
1000.0000 mg | ORAL_TABLET | Freq: Once | ORAL | Status: AC
  Administered 2024-04-27: 1000 mg via ORAL
  Filled 2024-04-27: qty 2

## 2024-04-27 NOTE — ED Provider Notes (Signed)
 [place] [facility] Provider Note   CSN: [id number] Arrival date & time: 04/27/24  2129     Patient presents with: Back Pain   Joseph [name] is a 43 y.o. male.   43 year old male with a past medical history of narcotic abuse, asthma, anxiety and depression presents to the ED with a chief complaint of low back pain, bilateral rib pain, leg swelling along with multiple complaints.  Patient reports he has not been taking care of himself, has not been taking any medication for his low back pain, has been doing some exercises at home but there has not been any improvement in his symptoms.  He reports now it is getting to the point where he cannot ambulate.  He reports he was previously incarcerated therefore he was unable to obtain any kind of PCP care.  His friend at the bedside tells me that he is here for a physical, I did discuss with him that we do not do physicals in the emergency department.  Patient would like to have his back evaluated along with his ribs.  Also reports pain along the substernal part of his chest exacerbated with any type of movement, thought that he likely had some rib problems.  Also endorsing shortness of breath with any type of movement.  Prior history of narcotic abuse however states that he has not done IV use in several months.  The history is provided by the patient.  Back Pain Location:  Lumbar spine Quality:  Aching Radiates to:  Does not radiate Pain severity:  Severe Pain is:  Same all the time Onset quality:  Gradual Duration:  4 weeks Timing:  Constant Progression:  Worsening Chronicity:  Recurrent Relieved by:  Nothing Worsened by:  Movement Ineffective treatments:  None tried Associated symptoms: chest pain   Associated symptoms: no abdominal pain and no fever   Risk factors: lack of exercise and obesity        Prior to Admission medications   Not on File    Allergies: Patient has no known  allergies.    Review of Systems  Constitutional:  Negative for chills and fever.  HENT:  Negative for sore throat.   Respiratory:  Positive for shortness of breath.   Cardiovascular:  Positive for chest pain.  Gastrointestinal:  Negative for abdominal pain, constipation, nausea and vomiting.  Musculoskeletal:  Positive for back pain.  All other systems reviewed and are negative.   Updated Vital Signs BP (!) 154/84 (BP Location: Right Leg)   Pulse 74   Temp 98.3 F (36.8 C) (Oral)   Resp 12   Ht 5' 9 (1.753 m)   Wt (!) 143.5 kg   SpO2 94%   BMI 46.72 kg/m   Physical Exam Vitals and nursing note reviewed.  Constitutional:      Appearance: He is obese. He is not ill-appearing.  HENT:     Head: Normocephalic and atraumatic.     Mouth/Throat:     Mouth: Mucous membranes are moist.  Cardiovascular:     Rate and Rhythm: Normal rate.  Pulmonary:     Effort: Pulmonary effort is normal.     Breath sounds: No wheezing.  Abdominal:     General: Abdomen is flat.  Musculoskeletal:        General: Tenderness present.     Cervical back: Normal range of motion and neck supple.     Lumbar back: Tenderness present. Decreased range of motion.  Back:     Right lower leg: 1+ Pitting Edema present.     Left lower leg: 1+ Pitting Edema present.     Comments: Decrease extension at the hip bilaterally.  Sensation is intact throughout.  Needed assistance to get into the bed and out of the car.  Skin:    General: Skin is warm and dry.  Neurological:     Mental Status: He is alert and oriented to person, place, and time.     (all labs ordered are listed, but only abnormal results are displayed) Labs Reviewed  CBC - Abnormal; Notable for the following components:      Result Value   RBC 4.16 (*)    Hemoglobin 12.3 (*)    HCT 38.1 (*)    All other components within normal limits  BASIC METABOLIC PANEL WITH GFR - Abnormal; Notable for the following components:   Glucose, Bld 107  (*)    All other components within normal limits  SEDIMENTATION RATE - Abnormal; Notable for the following components:   Sed Rate 36 (*)    All other components within normal limits  HEPARIN LEVEL (UNFRACTIONATED) - Abnormal; Notable for the following components:   Heparin Unfractionated 0.11 (*)    All other components within normal limits  CBC - Abnormal; Notable for the following components:   Hemoglobin 12.4 (*)    Platelets 148 (*)    All other components within normal limits  BASIC METABOLIC PANEL WITH GFR - Abnormal; Notable for the following components:   Glucose, Bld 137 (*)    All other components within normal limits  HEPARIN LEVEL (UNFRACTIONATED) - Abnormal; Notable for the following components:   Heparin Unfractionated 0.21 (*)    All other components within normal limits  CBC - Abnormal; Notable for the following components:   Hemoglobin 12.5 (*)    All other components within normal limits  CBC - Abnormal; Notable for the following components:   RBC 4.03 (*)    Hemoglobin 12.0 (*)    HCT 38.1 (*)    All other components within normal limits  GLUCOSE, CAPILLARY - Abnormal; Notable for the following components:   Glucose-Capillary 136 (*)    All other components within normal limits  CBC - Abnormal; Notable for the following components:   Platelets 111 (*)    All other components within normal limits  APTT - Abnormal; Notable for the following components:   aPTT 62 (*)    All other components within normal limits  HEPARIN LEVEL (UNFRACTIONATED) - Abnormal; Notable for the following components:   Heparin Unfractionated 0.82 (*)    All other components within normal limits  APTT - Abnormal; Notable for the following components:   aPTT 115 (*)    All other components within normal limits  APTT - Abnormal; Notable for the following components:   aPTT 102 (*)    All other components within normal limits  HEPARIN LEVEL (UNFRACTIONATED) - Abnormal; Notable for the  following components:   Heparin Unfractionated 0.86 (*)    All other components within normal limits  CBC - Abnormal; Notable for the following components:   WBC 10.9 (*)    RBC 3.98 (*)    Hemoglobin 11.7 (*)    HCT 35.7 (*)    Platelets 90 (*)    All other components within normal limits  HEPATIC FUNCTION PANEL  C-REACTIVE PROTEIN  HEPARIN LEVEL (UNFRACTIONATED)  HEPARIN LEVEL (UNFRACTIONATED)  HIV ANTIBODY (ROUTINE TESTING W REFLEX)  HEPARIN LEVEL (UNFRACTIONATED)  TSH  CBC WITH DIFFERENTIAL/PLATELET  APTT  TROPONIN T, HIGH SENSITIVITY  TROPONIN T, HIGH SENSITIVITY    EKG: EKG Interpretation Date/Time:  Wednesday April 27 2024 23:59:35 EST Ventricular Rate:  86 PR Interval:  176 QRS Duration:  102 QT Interval:  362 QTC Calculation: 433 R Axis:   9  Text Interpretation: Sinus rhythm Abnormal R-wave progression, early transition Otherwise within normal limits No old tracing to compare Confirmed by Raford Lenis ([id number]) on 04/28/2024 12:13:31 AM  Radiology: [facility] CHEST PORT 1 VIEW Result Date: 05/02/2024 CLINICAL DATA:  PICC line placement EXAM: PORTABLE CHEST 1 VIEW COMPARISON:  04/27/2024 FINDINGS: Left-sided PICC line tip: Lower SVC. No pneumothorax or complicating feature. The patient has known pulmonary nodules in both lungs much better shown on prior CT scan. Rightward tracheal deviation due to known left thyroid enlargement. 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 IMPRESSION: 1. Left-sided PICC line tip: Lower SVC. No pneumothorax or complicating feature. 2. The patient has known pulmonary nodules and a lytic mass of the T2 vertebral body, much better shown on prior CT scan. 3. Rightward tracheal deviation due to known left thyroid enlargement. Electronically Signed   By: Ryan Salvage M.D.   On: 05/02/2024 19:33   CT CHEST ABDOMEN PELVIS W CONTRAST Result Date: 05/02/2024 CLINICAL DATA:  Metastatic disease  evaluation * Tracking Code: BO * EXAM: CT CHEST, ABDOMEN, AND PELVIS WITH CONTRAST TECHNIQUE: Multidetector CT imaging of the chest, abdomen and pelvis was performed following the standard protocol during bolus administration of intravenous contrast. RADIATION DOSE REDUCTION: This exam was performed according to the departmental dose-optimization program which includes automated exposure control, adjustment of the mA and/or kV according to patient size and/or use of iterative reconstruction technique. CONTRAST:  OMNIPAQUE IOHEXOL 350 MG/ML SOLN COMPARISON:  CT chest angiogram, 04/28/2024 FINDINGS: CT CHEST FINDINGS Cardiovascular: Left upper extremity PICC, tip malpositioned, crossing the brachiocephalic confluence and directed into the right internal jugular (series 2, image 7). Normal heart size. No pericardial effusion. Mediastinum/Nodes: No enlarged mediastinal, hilar, or axillary lymph nodes. Small hiatal hernia. Enlarged left lobe of the thyroid, partially imaged, measuring at least 5.1 cm (series 2, image 1). Trachea, and esophagus demonstrate no significant findings. Lungs/Pleura: Numerous small bilateral pulmonary nodules throughout both lungs, at least 30, measuring 0.6 cm and smaller (series 26, image 94). No pleural effusion or pneumothorax. Musculoskeletal: No chest wall abnormality. Unchanged lytic lesion of the T2 vertebral body with an associated soft tissue component, separately reported by same day CT of the thoracic spine (series 2, image 10). CT ABDOMEN PELVIS FINDINGS Hepatobiliary: No solid liver abnormality is seen. Numerous small gallstones. Gallbladder wall thickening, or biliary dilatation. Pancreas: Unremarkable. No pancreatic ductal dilatation or surrounding inflammatory changes. Spleen: Normal in size without significant abnormality. Adrenals/Urinary Tract: Adrenal glands are unremarkable. Kidneys are normal, without renal calculi, solid lesion, or hydronephrosis. Bladder is  unremarkable. Stomach/Bowel: Stomach is within normal limits. Transverse duodenal diverticulum. Appendix appears normal. No evidence of bowel wall thickening, distention, or inflammatory changes. Vascular/Lymphatic: No significant vascular findings are present. No enlarged abdominal or pelvic lymph nodes. Reproductive: No mass or other abnormality. Other: Small fat containing umbilical hernia.  No ascites. Musculoskeletal: No acute osseous findings. IMPRESSION: 1. Numerous small bilateral pulmonary nodules throughout both lungs, at least 30, measuring 0.6 cm and smaller. Findings are highly suspicious for pulmonary metastatic disease given constellation of other findings. 2. 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 3. Enlarged left lobe of the thyroid, partially imaged, measuring at least 5.1 cm, possibly incidental and benign although primary thyroid malignancy is a differential consideration. This has been previously evaluated by thyroid ultrasound. 4. No evidence of lymphadenopathy or metastatic disease in the abdomen or pelvis. 5. Left upper extremity PICC, tip malpositioned, crossing the brachiocephalic confluence and directed into the right internal jugular. 6. Cholelithiasis. These results will be called to the ordering clinician or representative by the Radiologist Assistant, and communication documented in the PACS or [employer]. Electronically Signed   By: Marolyn JONETTA Jaksch M.D.   On: 05/02/2024 16:33   CT CERVICAL SPINE WO CONTRAST Result Date: 05/02/2024 EXAM: CT CERVICAL SPINE AND THORACIC SPINE WITHOUT CONTRAST 05/02/2024 01:14:06 PM TECHNIQUE: CT of the cervical and thoracic was performed without the administration of intravenous contrast. Multiplanar reformatted images are provided for review. Automated exposure control, iterative reconstruction, and/or weight based adjustment  of the mA/kV was utilized to reduce the radiation dose to as low as reasonably achievable. COMPARISON: MRI of cervical and thoracic spine dated 05/01/2024. CLINICAL HISTORY: Metastatic disease evaluation. FINDINGS: Evaluation is limited due to photon starvation and motion artifacts. CERVICAL SPINE: BONES AND ALIGNMENT: There is no acute fracture or traumatic malalignment. The vertebral body heights are maintained. There is a lucent lesion within the C6 vertebral body which may represent benign etiology such as hemangioma given its internal stippled appearance. DEGENERATIVE CHANGES: Evaluation of the spinal canal and its contents are also seen to better advantage on recent MRI, see separately dictated report. SOFT TISSUES: Redemonstrated markedly enlarged left lobe of the thyroid with rightward tracheal deviation and mild compression. THORACIC SPINE: BONES AND ALIGNMENT: The thoracic kyphosis is overall preserved without significant vertebral column listhesis. There is upper thoracic levocurvature. Redemonstrated soft tissue replacement of the T2 vertebral body that extends into the spinal canal, bilateral neural T1-T2 and T2-T3 neural foramen with associated spinal canal and neural foraminal stenosis seen to better advantage on recent MRI with intravenous contrast 05/01/2024. There is additionally circumferential and anterior soft tissue extension into the paravertebral space. The remaining vertebral body heights are maintained. There are lucent lesions within the T1, T3, and T6 vertebral bodies which may represent benign etiology such as hemangioma, given their internal stippled appearance. DEGENERATIVE CHANGES: Evaluation of the spinal canal and its contents seen to better advantage on recent MRI, see separately dictated report. SOFT TISSUES: Pulmonary nodules, please see separately dictated report for same day CT chest, abdomen, and pelvis with IV contrast 05/02/2024 for further relevant evaluation. INCIDENTAL  LUMBAR FINDINGS: Axial images obtained through the lumbar spine demonstrate a destructive lesion involving primarily the L5 inferior endplate as well as the S1 superior endplate with adjacent sclerosis. IMPRESSION: 1. Since 05/01/2024, redemonstrated destructive soft tissue replacement of the T2 vertebral body with circumferential extension involving the spinal canal, bilateral neural foraminal, and paravertebral spaces, all seen to better advantage on recent MRI. 2. Lucent lesion within the C6 vertebral body is suspicious for metastatic disease. 3. Aside from T2, no acute cervical or thoracic vertebral fracture or malalignment. 4. Pulmonary nodules, please see separately dictated report for same day CT chest, abdomen, and pelvis with IV contrast 05/02/2024 for further relevant evaluation. Electronically signed by: prentice bybordi 05/02/2024 04:03 PM EST RP Workstation: [id number]   CT T-SPINE NO CHARGE Result Date: 05/02/2024 EXAM: CT CERVICAL SPINE  AND THORACIC SPINE WITHOUT CONTRAST 05/02/2024 01:14:06 PM TECHNIQUE: CT of the cervical and thoracic was performed without the administration of intravenous contrast. Multiplanar reformatted images are provided for review. Automated exposure control, iterative reconstruction, and/or weight based adjustment of the mA/kV was utilized to reduce the radiation dose to as low as reasonably achievable. COMPARISON: MRI of cervical and thoracic spine dated 05/01/2024. CLINICAL HISTORY: Metastatic disease evaluation. FINDINGS: Evaluation is limited due to photon starvation and motion artifacts. CERVICAL SPINE: BONES AND ALIGNMENT: There is no acute fracture or traumatic malalignment. The vertebral body heights are maintained. There is a lucent lesion within the C6 vertebral body which may represent benign etiology such as hemangioma given its internal stippled appearance. DEGENERATIVE CHANGES: Evaluation of the spinal canal and its contents are also seen to better advantage  on recent MRI, see separately dictated report. SOFT TISSUES: Redemonstrated markedly enlarged left lobe of the thyroid with rightward tracheal deviation and mild compression. THORACIC SPINE: BONES AND ALIGNMENT: The thoracic kyphosis is overall preserved without significant vertebral column listhesis. There is upper thoracic levocurvature. Redemonstrated soft tissue replacement of the T2 vertebral body that extends into the spinal canal, bilateral neural T1-T2 and T2-T3 neural foramen with associated spinal canal and neural foraminal stenosis seen to better advantage on recent MRI with intravenous contrast 05/01/2024. There is additionally circumferential and anterior soft tissue extension into the paravertebral space. The remaining vertebral body heights are maintained. There are lucent lesions within the T1, T3, and T6 vertebral bodies which may represent benign etiology such as hemangioma, given their internal stippled appearance. DEGENERATIVE CHANGES: Evaluation of the spinal canal and its contents seen to better advantage on recent MRI, see separately dictated report. SOFT TISSUES: Pulmonary nodules, please see separately dictated report for same day CT chest, abdomen, and pelvis with IV contrast 05/02/2024 for further relevant evaluation. INCIDENTAL LUMBAR FINDINGS: Axial images obtained through the lumbar spine demonstrate a destructive lesion involving primarily the L5 inferior endplate as well as the S1 superior endplate with adjacent sclerosis. IMPRESSION: 1. Since 05/01/2024, redemonstrated destructive soft tissue replacement of the T2 vertebral body with circumferential extension involving the spinal canal, bilateral neural foraminal, and paravertebral spaces, all seen to better advantage on recent MRI. 2. Lucent lesion within the C6 vertebral body is suspicious for metastatic disease. 3. Aside from T2, no acute cervical or thoracic vertebral fracture or malalignment. 4. Pulmonary nodules, please see  separately dictated report for same day CT chest, abdomen, and pelvis with IV contrast 05/02/2024 for further relevant evaluation. Electronically signed by: prentice spade 05/02/2024 04:03 PM EST RP Workstation: [id number]   US  EKG SITE RITE Result Date: 05/02/2024 If Site Rite image not attached, placement could not be confirmed due to current cardiac rhythm.  MR THORACIC SPINE W WO CONTRAST Addendum Date: 05/01/2024 ADDENDUM #2  ADDENDUM: Sagittal STIR images of the cervical spine demonstrate subtle hyperintense signal within the thoracic cord at the T2 level concerning for mild cord edema. Addendum discussed with Tori, RN at 7:05 PM on 05/01/24. ---------------------------------------------------- Electronically signed by: Donnice Mania MD 05/01/2024 08:01 PM EST RP Workstation: [id number]   Addendum Date: 05/01/2024  ADDENDUM #1 ADDENDUM: Sagittal STIR images of the cervical spine demonstrate subtle hyperintense signal within the thoracic cord at the T2 level concerning for mild cord edema. ---------------------------------------------------- Electronically signed by: Donnice Mania MD 05/01/2024 07:00 PM EST RP Workstation: [id number]   Result Date: 05/01/2024  ORIGINAL REPORT EXAM: MRI THORACIC SPINE WITH AND WITHOUT INTRAVENOUS CONTRAST 05/01/2024 04:05:03 PM TECHNIQUE: Multiplanar  multisequence MRI of the thoracic spine was performed with and without the administration of intravenous contrast. CONTRAST: 10 mL of Gadavist. COMPARISON: MR Lumbar spine 04/28/2024. CLINICAL HISTORY: History of daily fentanyl  use and right shoulder pain due to football injury, present with cough, lower back pain, unsteady gait, numbness and tingling in the inner thighs going down the legs and difficulty moving lower extremities, reports that back pain started about 2 or 3 weeks ago and that there was no inciting injury, difficulty walking, due to unsteady gait and weakness of bilateral lower extremities began about 5 or 6  days ago, some numbness and tingling in his inner thighs and groin area, sometimes spreading down the backs of his legs, recent respiratory illness with a cough but no other recent symptoms and no recent gastrointestinal illness. FINDINGS: BONES AND ALIGNMENT: There is a STIR hyperintense enhancing lesion noted within the T2 vertebral body associated pathologic fracture with greater than 90% height loss of the T2 vertebra. Centrally there is significant bowing of the anterior and posterior cortices of the T2 vertebral body with abnormal enhancing soft tissue extending into the anterior prevertebral soft tissues with enhancement extending from the inferior margin of T1 inferiorly to the mid T3 level. There is additional enhancing soft tissue along the ventral epidural soft tissues from T1 to T2. Posterior bowing of the T2 cortex results in severe spinal canal stenosis with cord compression slightly greater on the left. There is no definite signal abnormality in the cord to suggest edema at this time. Thoracic vertebral body heights are otherwise maintained. Slightly exaggerated focal kyphosis at the level of pathologic fracture. Thoracic spine alignment otherwise maintained. Additional signal abnormality in the T2 spinous process which may reflect additional involvement by osseous lesion. There is edema within the posterior aspects of the T3 vertebral body as well as the pedicles and pars interarticularis of T3 slightly more pronounced on the left. There is mild associated enhancement. Findings could reflect additional area of osseous lesion versus reactive changes in the setting of fracture. Hemangioma in the T6 vertebral body. SPINAL CORD: Normal spinal cord volume. Normal spinal cord signal. SOFT TISSUES: Enlargement of the left thyroid lobe with a heterogeneous nodule demonstrating mild enhancement. The nodule measures at least 4.5 cm in diameter. Recommend correlation with recent thyroid ultrasound findings.  Abnormal enhancement of the soft tissues extends into the left sided neural foramina at T1-T2 and T2-T3 abutting the exiting T1 and T2 nerve roots. Foraminal involvement is more pronounced at the T2-T3 level. DEGENERATIVE CHANGES: No significant disc herniation. No spinal canal stenosis or neural foraminal narrowing. IMPRESSION: 1. Enhancing lesion within the T2 vertebral body with pathologic fracture resulting in severe vertebral body height loss, concerning for metastatic disease. 2. Significant bowing of the T2 posterior cortex causing severe spinal canal stenosis and cord compression. No definite cord edema. 3. Abnormal enhancement extending into the ventral epidural space at T1T2 and into the left T1T2 and T2T3 neural foramina abutting the exiting T1 and T2 nerve roots, concerning for extraosseous involvement of disease. 4. Edema and mild enhancement in the posterior T3 vertebral body, pedicles, and pars interarticularis, possibly representing additional osseous lesion versus reactive changes in the setting of fracture. 5. Enlarged left thyroid lobe with a heterogeneous nodule measuring at least 4.5 cm. Recommend management per thyroid ultrasound report. 6. Impression 1 and 2 discussed with Dr. Jadine at 6:17PM on 05/01/24. Electronically signed by: Donnice Mania MD 05/01/2024 06:51 PM EST RP Workstation: [id number]   MR CERVICAL  SPINE W WO CONTRAST Result Date: 05/01/2024 EXAM: MRI CERVICAL SPINE WITH AND WITHOUT CONTRAST 05/01/2024 04:14:35 PM TECHNIQUE: Multiplanar multisequence MRI of the cervical spine was performed without and with the administration of intravenous contrast. CONTRAST: 10 mL of Gadavist. COMPARISON: US  Thyroid 04/29/2024. CLINICAL HISTORY: History of daily fentanyl  use and right shoulder pain due to football injury, present with cough, lower back pain, unsteady gait, numbness and tingling in the inner thighs going down the legs and difficulty moving lower extremities, reports that  back pain started about 2 or 3 weeks ago and that there was no inciting injury, difficulty walking, due to unsteady gait and weakness of bilateral lower extremities began about 5 or 6 days ago, some numbness and tingling in his inner thighs and groin area, sometimes spreading down the backs of his legs, recent respiratory illness with a cough but no other recent symptoms and no recent gastrointestinal illness. FINDINGS: BONES AND ALIGNMENT: Straightening of the normal cervical lordosis. No evidence of traumatic malalignment. Normal vertebral body heights. Marrow signal is unremarkable except for a signal abnormality in the C6 vertebral body favored to reflect a hemangioma. No abnormal enhancement. Additional osseous lesion at the T2 level with associated pathologic fracture and severe spinal canal stenosis which is fully described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

## 2024-04-27 NOTE — ED Provider Notes (Signed)
 Care assumed from Johana Soto PA-C, patien with low back pain, rib pain, leg swelling. He has a history of IV drug abuse. X-rays show possible left upper lobe nodule, degenerative changes in lumbar spine. Labs are pending. He was not ambulatory in the ED. If creatinine is less than 1.5, plan on CT of chest and lumbar spine with contrast, may need MRI of lumbar spine.SABRA

## 2024-04-27 NOTE — ED Notes (Signed)
 Patient transported to X-ray
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
## 2024-04-28 ENCOUNTER — Observation Stay (HOSPITAL_COMMUNITY): Payer: MEDICAID

## 2024-04-28 ENCOUNTER — Emergency Department (HOSPITAL_BASED_OUTPATIENT_CLINIC_OR_DEPARTMENT_OTHER): Payer: MEDICAID

## 2024-04-28 DIAGNOSIS — S22009A Unspecified fracture of unspecified thoracic vertebra, initial encounter for closed fracture: Secondary | ICD-10-CM | POA: Diagnosis present

## 2024-04-28 DIAGNOSIS — M545 Low back pain, unspecified: Secondary | ICD-10-CM | POA: Diagnosis present

## 2024-04-28 DIAGNOSIS — D649 Anemia, unspecified: Secondary | ICD-10-CM | POA: Diagnosis present

## 2024-04-28 DIAGNOSIS — R600 Localized edema: Secondary | ICD-10-CM | POA: Diagnosis present

## 2024-04-28 DIAGNOSIS — R918 Other nonspecific abnormal finding of lung field: Secondary | ICD-10-CM | POA: Diagnosis present

## 2024-04-28 DIAGNOSIS — I10 Essential (primary) hypertension: Secondary | ICD-10-CM | POA: Diagnosis not present

## 2024-04-28 DIAGNOSIS — F1910 Other psychoactive substance abuse, uncomplicated: Secondary | ICD-10-CM

## 2024-04-28 DIAGNOSIS — Z743 Need for continuous supervision: Secondary | ICD-10-CM | POA: Diagnosis not present

## 2024-04-28 DIAGNOSIS — E049 Nontoxic goiter, unspecified: Secondary | ICD-10-CM | POA: Diagnosis present

## 2024-04-28 DIAGNOSIS — I2699 Other pulmonary embolism without acute cor pulmonale: Secondary | ICD-10-CM | POA: Diagnosis present

## 2024-04-28 DIAGNOSIS — M5441 Lumbago with sciatica, right side: Secondary | ICD-10-CM

## 2024-04-28 DIAGNOSIS — M5442 Lumbago with sciatica, left side: Secondary | ICD-10-CM | POA: Diagnosis not present

## 2024-04-28 DIAGNOSIS — R9389 Abnormal findings on diagnostic imaging of other specified body structures: Secondary | ICD-10-CM

## 2024-04-28 DIAGNOSIS — M546 Pain in thoracic spine: Secondary | ICD-10-CM | POA: Diagnosis not present

## 2024-04-28 DIAGNOSIS — R079 Chest pain, unspecified: Secondary | ICD-10-CM | POA: Diagnosis present

## 2024-04-28 DIAGNOSIS — I2693 Single subsegmental pulmonary embolism without acute cor pulmonale: Secondary | ICD-10-CM | POA: Diagnosis present

## 2024-04-28 DIAGNOSIS — R739 Hyperglycemia, unspecified: Secondary | ICD-10-CM | POA: Diagnosis present

## 2024-04-28 LAB — HEPARIN LEVEL (UNFRACTIONATED)
Heparin Unfractionated: 0.56 [IU]/mL (ref 0.30–0.70)
Heparin Unfractionated: 0.53 [IU]/mL (ref 0.30–0.70)
Heparin Unfractionated: 0.11 [IU]/mL — ABNORMAL LOW (ref 0.30–0.70)

## 2024-04-28 LAB — HEPATIC FUNCTION PANEL
ALT: 43 U/L (ref 0–44)
AST: 28 U/L (ref 15–41)
Albumin: 3.7 g/dL (ref 3.5–5.0)
Alkaline Phosphatase: 98 U/L (ref 38–126)
Bilirubin, Direct: 0.2 mg/dL (ref 0.0–0.2)
Indirect Bilirubin: 0.3 mg/dL (ref 0.3–0.9)
Total Bilirubin: 0.4 mg/dL (ref 0.0–1.2)
Total Protein: 7.9 g/dL (ref 6.5–8.1)

## 2024-04-28 LAB — BASIC METABOLIC PANEL WITH GFR
Anion gap: 10 (ref 5–15)
BUN: 8 mg/dL (ref 6–20)
CO2: 29 mmol/L (ref 22–32)
Calcium: 9.5 mg/dL (ref 8.9–10.3)
Chloride: 99 mmol/L (ref 98–111)
Creatinine, Ser: 0.94 mg/dL (ref 0.61–1.24)
GFR, Estimated: >60 mL/min (ref >60)
Glucose, Bld: 107 mg/dL — ABNORMAL HIGH (ref 70–99)
Potassium: 4.5 mmol/L (ref 3.5–5.1)
Sodium: 137 mmol/L (ref 135–145)

## 2024-04-28 LAB — C-REACTIVE PROTEIN: CRP: 0.6 mg/dL (ref <1.0)

## 2024-04-28 LAB — SEDIMENTATION RATE: Sed Rate: 36 mm/h — ABNORMAL HIGH (ref 0–16)

## 2024-04-28 LAB — TROPONIN T, HIGH SENSITIVITY
Troponin T High Sensitivity: <15 ng/L (ref 0–19)
Troponin T High Sensitivity: <15 ng/L (ref 0–19)

## 2024-04-28 MED ORDER — CYCLOBENZAPRINE HCL 5 MG PO TABS
5.0000 mg | ORAL_TABLET | Freq: Three times a day (TID) | ORAL | Status: DC | PRN
  Administered 2024-04-28 – 2024-05-01 (×9): 5 mg via ORAL
  Filled 2024-04-28 (×9): qty 1

## 2024-04-28 MED ORDER — DEXAMETHASONE SOD PHOSPHATE PF 10 MG/ML IJ SOLN
8.0000 mg | Freq: Once | INTRAMUSCULAR | Status: AC
  Administered 2024-04-28: 8 mg via INTRAVENOUS

## 2024-04-28 MED ORDER — KETOROLAC TROMETHAMINE 15 MG/ML IJ SOLN
15.0000 mg | Freq: Once | INTRAMUSCULAR | Status: AC
  Administered 2024-04-28: 15 mg via INTRAVENOUS
  Filled 2024-04-28: qty 1

## 2024-04-28 MED ORDER — ONDANSETRON HCL 4 MG/2ML IJ SOLN
4.0000 mg | Freq: Four times a day (QID) | INTRAMUSCULAR | Status: DC | PRN
  Filled 2024-04-28: qty 2

## 2024-04-28 MED ORDER — ONDANSETRON HCL 4 MG PO TABS: 4.0000 mg | ORAL_TABLET | Freq: Four times a day (QID) | ORAL | Status: DC | PRN

## 2024-04-28 MED ORDER — HEPARIN (PORCINE) 25000 UT/250ML-% IV SOLN
2200.0000 [IU]/h | INTRAVENOUS | Status: AC
  Administered 2024-04-28 (×2): 1800 [IU]/h via INTRAVENOUS
  Administered 2024-04-29 (×2): 2000 [IU]/h via INTRAVENOUS
  Filled 2024-04-28 (×3): qty 250

## 2024-04-28 MED ORDER — HEPARIN BOLUS VIA INFUSION
3000.0000 [IU] | Freq: Once | INTRAVENOUS | Status: AC
  Administered 2024-04-28: 3000 [IU] via INTRAVENOUS
  Filled 2024-04-28: qty 3000

## 2024-04-28 MED ORDER — KETOROLAC TROMETHAMINE 15 MG/ML IJ SOLN
15.0000 mg | Freq: Four times a day (QID) | INTRAMUSCULAR | Status: DC
  Administered 2024-04-28 – 2024-05-01 (×11): 15 mg via INTRAVENOUS
  Filled 2024-04-28 (×11): qty 1

## 2024-04-28 MED ORDER — SENNOSIDES-DOCUSATE SODIUM 8.6-50 MG PO TABS: 1.0000 | ORAL_TABLET | Freq: Every evening | ORAL | Status: DC | PRN

## 2024-04-28 MED ORDER — OXYCODONE HCL 5 MG PO TABS
5.0000 mg | ORAL_TABLET | Freq: Once | ORAL | Status: AC
  Administered 2024-04-28: 5 mg via ORAL
  Filled 2024-04-28: qty 1

## 2024-04-28 MED ORDER — SODIUM CHLORIDE 0.9% FLUSH
3.0000 mL | Freq: Two times a day (BID) | INTRAVENOUS | Status: DC
  Administered 2024-04-28 – 2024-05-16 (×34): 3 mL via INTRAVENOUS

## 2024-04-28 MED ORDER — ACETAMINOPHEN 325 MG PO TABS
650.0000 mg | ORAL_TABLET | Freq: Four times a day (QID) | ORAL | Status: DC | PRN
  Administered 2024-04-28 – 2024-04-30 (×5): 650 mg via ORAL
  Filled 2024-04-28 (×5): qty 2

## 2024-04-28 MED ORDER — HEPARIN BOLUS VIA INFUSION
5000.0000 [IU] | Freq: Once | INTRAVENOUS | Status: AC
  Administered 2024-04-28: 5000 [IU] via INTRAVENOUS

## 2024-04-28 MED ORDER — GADOBUTROL 1 MMOL/ML IV SOLN
10.0000 mL | Freq: Once | INTRAVENOUS | Status: AC | PRN
  Administered 2024-04-28: 10 mL via INTRAVENOUS

## 2024-04-28 MED ORDER — IOHEXOL 350 MG/ML SOLN
100.0000 mL | Freq: Once | INTRAVENOUS | Status: AC | PRN
  Administered 2024-04-28: 100 mL via INTRAVENOUS

## 2024-04-28 NOTE — ED Notes (Signed)
 Called lab to dispatch courier for heparin level

## 2024-04-28 NOTE — ED Notes (Signed)
 Patient transported to CT

## 2024-04-28 NOTE — Progress Notes (Addendum)
 PHARMACY - ANTICOAGULATION CONSULT NOTE  Pharmacy Consult for heparin Indication: pulmonary embolus  No Known Allergies  Patient Measurements: Height: 5' 9 (175.3 cm) Weight: (!) 143.5 kg (316 lb 5.8 oz) IBW/kg (Calculated) : 70.7 HEPARIN DW (KG): 104.9  Vital Signs: Temp: 98.1 F (36.7 C) (11/06 0836) Temp Source: Oral (11/06 0836) BP: 147/90 (11/06 0800) Pulse Rate: 76 (11/06 0800)  Labs: Recent Labs    04/27/24 2339 04/28/24 0818  HGB 12.3*  --   HCT 38.1*  --   PLT 155  --   HEPARINUNFRC  --  0.56  CREATININE 0.94  --     Estimated Creatinine Clearance: 143 mL/min (by C-G formula based on SCr of 0.94 mg/dL).  Assessment: 43yo male c/o lower back pain and SOB with any level of exertion. Found to have a PE. Initial heparin level is therapeutic at 0.56. No bleeding noted.   Goal of Therapy:  Heparin level 0.3-0.7 units/ml Monitor platelets by anticoagulation protocol: Yes   Plan:  Continue heparin gtt 1800 units/hr Recheck heparin level this afternoon to confirm dosing Daily heparin level and CBC   Vernell Meier, PharmD, BCPS, BCEMP Clinical Pharmacist Please see AMION for all pharmacy numbers 04/28/2024 10:35 AM  Addendum: Repeat heparin level is therapeutic but appears to have been drawn several hours early. Continue current rate and recheck level in the morning.   Vernell Meier, PharmD, BCPS, BCEMP Clinical Pharmacist Please see AMION for all pharmacy numbers 04/28/2024 2:43 PM

## 2024-04-28 NOTE — Plan of Care (Signed)

## 2024-04-28 NOTE — ED Notes (Signed)
 Confirmed with pt, he has no daily meds to reconcile.

## 2024-04-28 NOTE — Progress Notes (Signed)
 PHARMACY - ANTICOAGULATION CONSULT NOTE  Pharmacy Consult for heparin Indication: pulmonary embolus  No Known Allergies  Patient Measurements: Height: 5' 9 (175.3 cm) Weight: (!) 143.5 kg (316 lb 5.8 oz) IBW/kg (Calculated) : 70.7 HEPARIN DW (KG): 104.9  Vital Signs: Temp: 98.6 F (37 C) (11/06 1646) Temp Source: Oral (11/06 1646) BP: 142/93 (11/06 1646) Pulse Rate: 80 (11/06 1646)  Labs: Recent Labs    04/27/24 2339 04/28/24 0818 04/28/24 1156 04/28/24 1448  HGB 12.3*  --   --   --   HCT 38.1*  --   --   --   PLT 155  --   --   --   HEPARINUNFRC  --  0.56 0.53 0.11*  CREATININE 0.94  --   --   --     Estimated Creatinine Clearance: 143 mL/min (by C-G formula based on SCr of 0.94 mg/dL).  Assessment: 43yo male c/o lower back pain and SOB with any level of exertion. Found to have a PE. Pharmacy consulted for heparin management.  Heparin initially therapeutic. Lab collected after transfer is subtherapeutic. No complications of therapy noted, no interruptions by report.  Goal of Therapy:  Heparin level 0.3-0.7 units/ml Monitor platelets by anticoagulation protocol: Yes   Plan:  -Heparin 3000 unit bolus -Increase heparin infusion to 2000 units/hr -Recheck heparin level ~ 6 hours (scheduled for 0400) -Daily heparin level and CBC   Stefano MARLA Bologna, PharmD, BCPS Clinical Pharmacist 04/28/2024 8:40 PM
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
## 2024-04-28 NOTE — H&P (Addendum)
 History and Physical    Patient: Joseph [name] FMW:[id number] DOB: Dec 22, 1980 DOA: 04/27/2024 DOS: the patient was seen and examined on 04/28/2024 PCP: Patient, No Pcp Per  Patient coming from: Home  Chief Complaint:  Chief Complaint  Patient presents with   Back Pain   HPI: MCKYLE SOLANKI is a 43 y.o. male with medical history significant for daily IV fentanyl  abuse, last used yesterday morning who presents with severe lower back pain.  The patient is a poor historian but reports that he injured his back about a month ago.  The pain and gotten better until 4 days ago.  It it started back and increased and 2 days ago he had difficulty walking.  He has been having to hold onto things to move around.  He reports severe spasms in his abdomen and all up and down both legs all the way down to his feet.  He denies any difficulty urinating or having bowel movements.  But he feels like he is getting weaker and that is actually having difficulty walking not just pain.  A friend advised him to come in for evaluation.  In addition to the back pain he also reports that his ribs have been hurting for about a week and a half.  Scribes pain as a soreness but it is circumferential around his chest.  Has been going on for about a week and a half.  He denies any shortness of breath.  He denies any fevers or chills.  No upper chest pain.  He did have a little bit of a cough a couple weeks ago but it is gone now. In the emergency department the patient had a CT of his chest and L-spine. The CT of the L-spine revealed a Schmorl node.  The CT of his chest revealed a RUL PE   Review of Systems: As mentioned in the history of present illness. All other systems reviewed and are negative. Past Medical History:  Diagnosis Date   Anxiety    Asthma    as a child   Depression    Narcotic abuse (HCC)    Past Surgical History:  Procedure Laterality Date   CLOSED REDUCTION FINGER WITH PERCUTANEOUS PINNING Right  06/22/2013   Procedure: RIGHT HAND SMALL FINGER CLOSED MANIPULATION AND PINNING;  Surgeon: Prentice LELON Pagan, MD;  Location: [facility] OR;  Service: Orthopedics;  Laterality: Right;   I & D EXTREMITY  03/24/2012   Procedure: IRRIGATION AND DEBRIDEMENT EXTREMITY;  Surgeon: Oneil JAYSON Herald, MD;  Location: [facility] OR;  Service: Orthopedics;  Laterality: Right;  Right Forearm   Social History:  reports that he has been smoking cigarettes. He has a 10 pack-year smoking history. He has never used smokeless tobacco. He reports that he does not currently use alcohol. He reports current drug use. Drug: Marijuana.  No Known Allergies  Family History  Problem Relation Age of Onset   Other Other    Alcohol abuse Other     Prior to Admission medications   Medication Sig Start Date End Date Taking? Authorizing Provider  acetaminophen  (TYLENOL ) 500 MG tablet Take 1,000 mg by mouth every 6 (six) hours as needed for mild pain.     [provider]    Physical Exam: Vitals:   04/28/24 0836 04/28/24 1030 04/28/24 1415 04/28/24 1646  BP:  (!) 179/92 130/77 (!) 142/93  Pulse:  76 83 80  Resp:  18 18 (!) 22  Temp: 98.1 F (36.7 C)  98.1 F (  36.7 C) 98.6 F (37 C)  TempSrc: Oral  Oral Oral  SpO2:  98% 97% 97%  Weight:      Height:       Physical Exam:  General: No acute distress, well-developed  HEENT: Normocephalic, atraumatic, PERRL Cardiovascular: Normal rate and rhythm. Distal pulses intact. Pulmonary: Normal pulmonary effort, diminished breath sounds of the right lung posterior Gastrointestinal: Nondistended abdomen, soft, non-tender, normoactive bowel sounds, no organomegaly Musculoskeletal:No pitting edema, but he has large forearms and lower legs likely secondary to IV drug abuse.  Pot marks are visible in every extremity. Right shoulder with significantly decreased range of motion and severe pain with range of motion. Lymphadenopathy: No cervical LAD. Skin: Skin is warm and dry with multiple  circular scars in all extremities Neuro: The patient can lift each leg up off the bed a few inches but with difficulty.  He describes decreased sensation in his inner upper inner thighs and in his buttocks area. Downgoing plantar reflexes bilateraly. AAOx3.  PSYCH: Attentive and cooperative  Data Reviewed:  Results for orders placed or performed during the hospital encounter of 04/27/24 (from the past 24 hours)  Troponin T, High Sensitivity     Status: None   Collection Time: 04/27/24 10:47 PM  Result Value Ref Range   Troponin T High Sensitivity <15 0 - 19 ng/L  Troponin T, High Sensitivity     Status: None   Collection Time: 04/27/24 11:39 PM  Result Value Ref Range   Troponin T High Sensitivity <15 0 - 19 ng/L  CBC     Status: Abnormal   Collection Time: 04/27/24 11:39 PM  Result Value Ref Range   WBC 6.1 4.0 - 10.5 K/uL   RBC 4.16 (L) 4.22 - 5.81 MIL/uL   Hemoglobin 12.3 (L) 13.0 - 17.0 g/dL   HCT 61.8 (L) 60.9 - 47.9 %   MCV 91.6 80.0 - 100.0 fL   MCH 29.6 26.0 - 34.0 pg   MCHC 32.3 30.0 - 36.0 g/dL   RDW 88.0 88.4 - 84.4 %   Platelets 155 150 - 400 K/uL   nRBC 0.0 0.0 - 0.2 %  Basic metabolic panel     Status: Abnormal   Collection Time: 04/27/24 11:39 PM  Result Value Ref Range   Sodium 137 135 - 145 mmol/L   Potassium 4.5 3.5 - 5.1 mmol/L   Chloride 99 98 - 111 mmol/L   CO2 29 22 - 32 mmol/L   Glucose, Bld 107 (H) 70 - 99 mg/dL   BUN 8 6 - 20 mg/dL   Creatinine, Ser 9.05 0.61 - 1.24 mg/dL   Calcium 9.5 8.9 - 89.6 mg/dL   GFR, Estimated >39 >39 mL/min   Anion gap 10 5 - 15  Sedimentation rate     Status: Abnormal   Collection Time: 04/27/24 11:57 PM  Result Value Ref Range   Sed Rate 36 (H) 0 - 16 mm/hr  C-reactive protein     Status: None   Collection Time: 04/27/24 11:57 PM  Result Value Ref Range   CRP 0.6 <1.0 mg/dL  Hepatic function panel     Status: None   Collection Time: 04/28/24 12:00 AM  Result Value Ref Range   Total Protein 7.9 6.5 - 8.1 g/dL    Albumin 3.7 3.5 - 5.0 g/dL   AST 28 15 - 41 U/L   ALT 43 0 - 44 U/L   Alkaline Phosphatase 98 38 - 126 U/L   Total Bilirubin 0.4  0.0 - 1.2 mg/dL   Bilirubin, Direct 0.2 0.0 - 0.2 mg/dL   Indirect Bilirubin 0.3 0.3 - 0.9 mg/dL  Heparin level (unfractionated)     Status: None   Collection Time: 04/28/24  8:18 AM  Result Value Ref Range   Heparin Unfractionated 0.56 0.30 - 0.70 IU/mL  Heparin level (unfractionated)     Status: None   Collection Time: 04/28/24 11:56 AM  Result Value Ref Range   Heparin Unfractionated 0.53 0.30 - 0.70 IU/mL  Heparin level (unfractionated)     Status: Abnormal   Collection Time: 04/28/24  2:48 PM  Result Value Ref Range   Heparin Unfractionated 0.11 (L) 0.30 - 0.70 IU/mL    CT Lumbar spine IMPRESSION: 1. Large Schmorl's node at the inferior L5 endplate, which may be painful. 2. No specific evidence of discitis by CT. An MRI with contrast could provide more sensitive evaluation. 3. No evidence of acute fracture or malalignment. 4. Cholelithiasis.   CTA chest IMPRESSION: 1. Right upper lobe segmental pulmonary embolism. Main pulmonary artery is enlarged compatible with pulmonary arterial hypertension. No evidence for right heart strain. 2. Numerous scattered bilateral solid pulmonary nodules measuring up to 6 mm in the right lower lobe. Metastatic disease not excluded given appearance and distribution. 3. Enlarged and heterogeneous left thyroid lobe measuring up to 5 cm with rightward tracheal deviation. Recommend non-emergent thyroid ultrasound. 4. Small hiatal hernia and gallstones.  Assessment and Plan: PE -this was an incidental finding.  The patient has no shortness of breath.  He is on room air with good sats. - Currently on IV heparin.  He will need to transition to an oral medication.  This will be a challenge given the fact that he is an IV drug abuser and follow-up may be tricky.  2.  Severe low back pain -this is what brought the  patient to the emergency department.  His CT revealed a Schmorl node.  However an MRI is pending because he has not able to walk and is having significant muscle spasms and decreased sensation in his saddle area.  No urinary or bowel changes. - Will give 1 dose of IV Decadron 8mg  while waiting on MRI results - Physical therapy consultation - Consider orthopedic consultation if the patient is not able to walk in am.   3.  Current every day IV fentanyl  abuse - I discussed expectations with the patient. Will manage the patient's pain with scheduled IV Toradol  q 6, muscle relaxers prn, possible p.o. oxycodone .  No IV narcotics planned. That strategy may need to change if he is going to  have a prolonged hospitalization.  4.  Abnormal CT scan - he has numerous bilateral pulmonary nodules and an enlarged left thyroid with tracheal deviation lobe - thyroid ultrasound ordered  5. Right rotator cuff injury suspected - his right shoulder pain and severe limitations have been worsening over years. - Consider MRI    Advance Care Planning:   Code Status: Full Code the patient wants to be full code and names his mother as a runner, broadcasting/film/video.  Consults: None  Family Communication: None  Severity of Illness: The appropriate patient status for this patient is OBSERVATION. Observation status is judged to be reasonable and necessary in order to provide the required intensity of service to ensure the patient's safety. The patient's presenting symptoms, physical exam findings, and initial radiographic and laboratory data in the context of their medical condition is felt to place them at decreased risk for  further clinical deterioration. Furthermore, it is anticipated that the patient will be medically stable for discharge from the hospital within 2 midnights of admission.   Author: ARTHEA CHILD, MD 04/28/2024 8:18 PM  For on call review [email or web address].

## 2024-04-28 NOTE — ED Notes (Signed)
 Pt provided oatmeal and orange juice.

## 2024-04-28 NOTE — ED Provider Notes (Signed)
 Patient w/ history of polysubstance abuse.  He reports the oral medications not working well for him and he is requesting intravenous fentanyl  to help with his pain.  Patient reports that he receives fentanyl  from [facility]  PDMP was reviewed, patient has no controlled substance prescriptions.  Increased concern for secondary gain, drug-seeking behavior.  Recommend limiting IV narcotics moving forward.    Elnor Jayson LABOR, DO 04/28/24 1233

## 2024-04-28 NOTE — ED Notes (Signed)
 Called lab services to inform of courier need to pick up heparin level blood work at 0800.

## 2024-04-28 NOTE — Progress Notes (Signed)
 Patient arrived via Careers Information Officer. Reports 10/10 back pain. AO X 4, heparin drip currently infusing at 18 ml/hour.

## 2024-04-28 NOTE — ED Notes (Signed)
 [facility] called for transport to [facility] @14 :00.  Spoke with Tinnie

## 2024-04-28 NOTE — Progress Notes (Signed)
 PHARMACY - ANTICOAGULATION CONSULT NOTE  Pharmacy Consult for heparin Indication: pulmonary embolus  No Known Allergies  Patient Measurements: Height: 5' 9 (175.3 cm) Weight: (!) 143.5 kg (316 lb 5.8 oz) IBW/kg (Calculated) : 70.7 HEPARIN DW (KG): 104.9  Vital Signs: Temp: 97.9 F (36.6 C) (11/06 0003) Temp Source: Oral (11/06 0003) BP: 177/96 (11/06 0003) Pulse Rate: 84 (11/06 0003)  Labs: Recent Labs    04/27/24 2339  HGB 12.3*  HCT 38.1*  PLT 155  CREATININE 0.94    Estimated Creatinine Clearance: 143 mL/min (by C-G formula based on SCr of 0.94 mg/dL).   Medical History: Past Medical History:  Diagnosis Date   Anxiety    Asthma    as a child   Depression    Narcotic abuse (HCC)     Assessment: 43yo male c/o lower back pain and SOB with any level of exertion, CT reveals PE >> to begin heparin.  Goal of Therapy:  Heparin level 0.3-0.7 units/ml Monitor platelets by anticoagulation protocol: Yes   Plan:  Heparin 5000 units IV bolus followed by infusion at 1800 units/hr. Monitor heparin levels and CBC.  Marvetta Dauphin, PharmD, BCPS  04/28/2024,1:50 AM

## 2024-04-29 ENCOUNTER — Other Ambulatory Visit (HOSPITAL_COMMUNITY): Payer: Self-pay

## 2024-04-29 ENCOUNTER — Encounter (HOSPITAL_COMMUNITY): Payer: Self-pay | Admitting: Family Medicine

## 2024-04-29 ENCOUNTER — Observation Stay (HOSPITAL_COMMUNITY): Payer: MEDICAID

## 2024-04-29 ENCOUNTER — Telehealth (HOSPITAL_COMMUNITY): Payer: Self-pay | Admitting: Pharmacy Technician

## 2024-04-29 DIAGNOSIS — I272 Pulmonary hypertension, unspecified: Secondary | ICD-10-CM | POA: Insufficient documentation

## 2024-04-29 DIAGNOSIS — I2699 Other pulmonary embolism without acute cor pulmonale: Secondary | ICD-10-CM

## 2024-04-29 DIAGNOSIS — M545 Low back pain, unspecified: Secondary | ICD-10-CM | POA: Insufficient documentation

## 2024-04-29 DIAGNOSIS — R918 Other nonspecific abnormal finding of lung field: Secondary | ICD-10-CM | POA: Insufficient documentation

## 2024-04-29 DIAGNOSIS — R946 Abnormal results of thyroid function studies: Secondary | ICD-10-CM

## 2024-04-29 DIAGNOSIS — E041 Nontoxic single thyroid nodule: Secondary | ICD-10-CM | POA: Insufficient documentation

## 2024-04-29 LAB — CBC
HCT: 40.3 % (ref 39.0–52.0)
Hemoglobin: 12.4 g/dL — ABNORMAL LOW (ref 13.0–17.0)
MCH: 28.5 pg (ref 26.0–34.0)
MCHC: 30.8 g/dL (ref 30.0–36.0)
MCV: 92.6 fL (ref 80.0–100.0)
Platelets: 148 K/uL — ABNORMAL LOW (ref 150–400)
RBC: 4.35 MIL/uL (ref 4.22–5.81)
RDW: 11.6 % (ref 11.5–15.5)
WBC: 4.8 K/uL (ref 4.0–10.5)
nRBC: 0 % (ref 0.0–0.2)

## 2024-04-29 LAB — BASIC METABOLIC PANEL WITH GFR
Anion gap: 7 (ref 5–15)
BUN: 11 mg/dL (ref 6–20)
CO2: 28 mmol/L (ref 22–32)
Calcium: 9.4 mg/dL (ref 8.9–10.3)
Chloride: 101 mmol/L (ref 98–111)
Creatinine, Ser: 0.87 mg/dL (ref 0.61–1.24)
GFR, Estimated: >60 mL/min (ref >60)
Glucose, Bld: 137 mg/dL — ABNORMAL HIGH (ref 70–99)
Potassium: 4.3 mmol/L (ref 3.5–5.1)
Sodium: 135 mmol/L (ref 135–145)

## 2024-04-29 LAB — HIV ANTIBODY (ROUTINE TESTING W REFLEX): HIV Screen 4th Generation wRfx: NONREACTIVE

## 2024-04-29 LAB — HEPARIN LEVEL (UNFRACTIONATED)
Heparin Unfractionated: 0.52 [IU]/mL (ref 0.30–0.70)
Heparin Unfractionated: 0.21 [IU]/mL — ABNORMAL LOW (ref 0.30–0.70)

## 2024-04-29 MED ORDER — APIXABAN 5 MG PO TABS
10.0000 mg | ORAL_TABLET | Freq: Two times a day (BID) | ORAL | Status: DC
  Administered 2024-04-29 – 2024-05-01 (×4): 10 mg via ORAL
  Filled 2024-04-29 (×4): qty 2

## 2024-04-29 MED ORDER — APIXABAN 5 MG PO TABS: 5.0000 mg | ORAL_TABLET | Freq: Two times a day (BID) | ORAL | Status: DC

## 2024-04-29 MED ORDER — HEPARIN BOLUS VIA INFUSION
1500.0000 [IU] | Freq: Once | INTRAVENOUS | Status: AC
  Administered 2024-04-29: 1500 [IU] via INTRAVENOUS
  Filled 2024-04-29: qty 1500

## 2024-04-29 NOTE — Hospital Course (Addendum)
 43 year old man PMH including drug use (snorting fentanyl ) also documentation in the medical record of IV drug use although he denies, who presented with back pain for about 4 days and difficulty walking.  Workup revealed incidental PE finding and lung nodules.  Imaging of the lumbar spine was unrevealing and he was admitted for treatment of PE and further evaluation of back pain.  Discussed informally with neurosurgery who reviewed films, no intervention recommended.  Subsequently did poorly with therapy, neurology consultation placed.  Pursuing further evaluation today.  Consultants None but did discuss with neurosurgery informally   Procedures/Events 11/6 admission for low back pain and difficulty walking 11/7 discussed with Dr. Jackson neurosurgery, no intervention recommended 11/8 neurology consulted for persistent back pain and ambulatory dysfunction

## 2024-04-29 NOTE — Telephone Encounter (Signed)
 Patient Product/process Development Scientist completed.    The patient is insured through [employer].     Ran test claim for Eliquis 5 mg and the current 30 day co-pay is $4.00.  Ran test claim for Xarelto 20 mg and the current 30 day co-pay is $4.00.    This test claim was processed through [facility]- copay amounts may vary at other pharmacies due to pharmacy/plan contracts, or as the patient moves through the different stages of their insurance plan.     Reyes Sharps, CPHT Pharmacy Technician Patient Advocate Specialist Lead [facility] Patient Advocate Team Direct Number: [phone]  Fax: [phone]

## 2024-04-29 NOTE — Progress Notes (Addendum)
 PHARMACY - ANTICOAGULATION CONSULT NOTE  Pharmacy Consult for heparin Indication: pulmonary embolus  No Known Allergies  Patient Measurements: Height: 5' 9 (175.3 cm) Weight: (!) 143.5 kg (316 lb 5.8 oz) IBW/kg (Calculated) : 70.7 HEPARIN DW (KG): 104.9  Vital Signs: Temp: 98 F (36.7 C) (11/07 0445) BP: 153/95 (11/07 0445) Pulse Rate: 92 (11/07 0445)  Labs: Recent Labs    04/27/24 2339 04/28/24 0818 04/28/24 1448 04/29/24 0516 04/29/24 1119  HGB 12.3*  --   --  12.4*  --   HCT 38.1*  --   --  40.3  --   PLT 155  --   --  148*  --   HEPARINUNFRC  --    < > 0.11* 0.52 0.21*  CREATININE 0.94  --   --  0.87  --    < > = values in this interval not displayed.    Estimated Creatinine Clearance: 154.5 mL/min (by C-G formula based on SCr of 0.87 mg/dL).  Assessment: 43yo male c/o lower back pain and SOB with any level of exertion. Found to have a PE. Pharmacy consulted for heparin management.  04/29/2024: Heparin level 0.21- subtherapeutic on IV heparin 2000 units/hr CBC: Hg slightly low/stable at 12.4, pltc 148 -slightly low/stable No bleeding reported, infusion was paused 0800 this morning for about 25 minutes due to some leaking per RN  Will give a small bolus given heparin has been running for 3 hours since pausing and heparin level remains low   Goal of Therapy:  Heparin level 0.3-0.7 units/ml Monitor platelets by anticoagulation protocol: Yes   Plan:  - 1500 unit bolus then increase to 2200 units/hr  -Recheck heparin level in 6 hours  -Daily heparin level and CBC  - Continue monitoring for signs and symptoms of bleeding    Addendum:  Pharmacy now consulted to transition to Eliquis. Patient s/p heparin bolus 1500 units x 1 ~1330 and heparin rate increased to 2200 units/hr. Given recent bolus will continue heparin infusion for a few hours prior to starting Eliquis.   Plan: Continue heparin @ 2200 units/hr until 1700 then stop  At 1700 give first dose of  Eliquis - planning 10 mg BID x 7 days then 5 mg BID thereafter  Continue to monitor for signs and symptoms of bleeding   Aleck Freiberg, PharmD, BCPS Clinical Pharmacist 04/29/2024 1:21 PM

## 2024-04-29 NOTE — Evaluation (Signed)
 Physical Therapy Evaluation Patient Details Name: Joseph [name] MRN: [id number] DOB: [birth year] Today's Date: 04/29/2024  History of Present Illness  43 yo male presents to therapy following hospitalization on 04/27/2024 due to SOB, BLE edema, rib and LBP. Pt was found to have R UL PE and pt started on heparin as well as nodules in lung and thyroid.  CT of lumbar spine also revealed Schmorl's nodule at inferior endplate of L5, MRI of lumbar spine with stenosis, disc protrusion and foraminal narrowing.  Pt PMH includes but is not limited to: anxiety, depression, and substance abuse.  Clinical Impression  Pt admitted with above diagnosis.  Pt currently with functional limitations due to the deficits listed below (see PT Problem List). Pt in bed when PT arrived. Pt agreeable to therapy intervention. Pt reports no SOB throughout eval, pt endorses back, R shoulder and rib pain. Pt reports B LE abn sensation at baseline with noted pitting edema B LE, pt states new and sudden onset of poor B LE motor control and coordination with pt stating having to borrow RW day prior to hospital admission and one fall secondary to R LE giving out. Pt is CGA with cues and use of hospital bed for supine to sit, CGA to min A for sit to stand with pt guarding R shoulder, pt required min A for gait tasks with RW, recliner close and pt demonstrating erratic ataxic like gait pattern with poor B LE motor control, coordination and stability with pt quick to fatigue. Pt left seated in recliner, all needs in place and nurse aware of A x 1 with RW for transfer tasks only at this time for pt and staff safety. Pt will benefit from acute skilled PT to increase their independence and safety with mobility to allow discharge.   Use of timed 5 x heel to knee tap test to further assess B LE motor coordination, strength, control and endurance:  R heel to L knee: 39s with poor quality of movement, pt unable to strike target (L knee) for all 5  trials and 2 x mid anterior distal LE and remaining 3 trials pt unable to maneuver R heel to touch L LE with pt reporting R LE fatigue.  L heel to R knee: 25s with poor quality of movement, pt able to strike target (R knee) x 2, R mid distal anterior LE x 2 and final attempt pt unable to maneuver L LE to touch R LE.        If plan is discharge home, recommend the following: A lot of help with walking and/or transfers;A lot of help with bathing/dressing/bathroom;Assistance with cooking/housework;Assist for transportation   Can travel by private vehicle        Equipment Recommendations Rolling walker (2 wheels)  Recommendations for Other Services  OT consult    Functional Status Assessment Patient has had a recent decline in their functional status and demonstrates the ability to make significant improvements in function in a reasonable and predictable amount of time.     Precautions / Restrictions Precautions Precautions: Fall Restrictions Weight Bearing Restrictions Per Provider Order: No      Mobility  Bed Mobility Overal bed mobility: Needs Assistance Bed Mobility: Supine to Sit     Supine to sit: Contact guard, HOB elevated, Used rails     General bed mobility comments: min cues    Transfers Overall transfer level: Needs assistance Equipment used: Rolling walker (2 wheels) Transfers: Sit to/from Stand Sit to Stand: Min assist  General transfer comment: cues for pt to push to stand with L UE from EOB to RW, and min A for sit to stand from recliner with B UE push    Ambulation/Gait Ambulation/Gait assistance: Min assist Gait Distance (Feet): 10 Feet Assistive device: Rolling walker (2 wheels) Gait Pattern/deviations: Ataxic, Trunk flexed, Knees buckling Gait velocity: decreased     General Gait Details: pt exhibits an erratic, ataxic like gait pattern with noted B LE instability and poor motor processing and planing R > L and varying stride  lengths with reports of fatigue and recliner close  Stairs            Wheelchair Mobility     Tilt Bed    Modified Rankin (Stroke Patients Only)       Balance Overall balance assessment: History of Falls, Needs assistance Sitting-balance support: Feet supported Sitting balance-Leahy Scale: Fair     Standing balance support: Bilateral upper extremity supported, During functional activity, Reliant on assistive device for balance Standing balance-Leahy Scale: Poor                               Pertinent Vitals/Pain Pain Assessment Pain Assessment: 0-10 Pain Score: 8  Pain Location: R shoulder, ribs and back Pain Descriptors / Indicators: Aching, Constant, Discomfort, Dull, Grimacing, Guarding Pain Intervention(s): Limited activity within patient's tolerance, Monitored during session, Premedicated before session, Repositioned    Home Living Family/patient expects to be discharged to:: Other (Comment) (hotel) Living Arrangements: Spouse/significant other;Other relatives                 Additional Comments: pt currently in second story hotel with elevator    Prior Function Prior Level of Function : Independent/Modified Independent;History of Falls (last six months) (R LE gave out)             Mobility Comments: pt reports having to borrow a RW the day prior to hospital admission due to B LE instabiltiy, prior to that time pt was IND for all ADLs, self care tasks and IADLs       Extremity/Trunk Assessment        Lower Extremity Assessment Lower Extremity Assessment: Generalized weakness (B LE abn sensation with pt exhibitng poor motor control and coordination once in standing)    Cervical / Trunk Assessment Cervical / Trunk Assessment: Normal  Communication   Communication Communication: No apparent difficulties    Cognition Arousal: Alert Behavior During Therapy: WFL for tasks assessed/performed   PT - Cognitive impairments: No  apparent impairments                         Following commands: Intact       Cueing       General Comments      Exercises     Assessment/Plan    PT Assessment Patient needs continued PT services  PT Problem List Decreased strength;Decreased activity tolerance;Decreased balance;Decreased mobility;Decreased coordination;Pain       PT Treatment Interventions DME instruction;Gait training;Functional mobility training;Therapeutic activities;Therapeutic exercise;Balance training;Neuromuscular re-education;Patient/family education    PT Goals (Current goals can be found in the Care Plan section)  Acute Rehab PT Goals Patient Stated Goal: to get stronger and go home PT Goal Formulation: With patient Time For Goal Achievement: 05/13/24 Potential to Achieve Goals: Good    Frequency Min 3X/week     Co-evaluation  AM-PAC PT 6 Clicks Mobility  Outcome Measure Help needed turning from your back to your side while in a flat bed without using bedrails?: None Help needed moving from lying on your back to sitting on the side of a flat bed without using bedrails?: A Little Help needed moving to and from a bed to a chair (including a wheelchair)?: A Little Help needed standing up from a chair using your arms (e.g., wheelchair or bedside chair)?: A Little Help needed to walk in hospital room?: A Lot Help needed climbing 3-5 steps with a railing? : Total 6 Click Score: 16    End of Session Equipment Utilized During Treatment: Gait belt Activity Tolerance: Patient limited by fatigue Patient left: in chair;with call bell/phone within reach Nurse Communication: Mobility status;Other (comment) (B LE instability and abn sensation) PT Visit Diagnosis: Unsteadiness on feet (R26.81);Other abnormalities of gait and mobility (R26.89);Muscle weakness (generalized) (M62.81);History of falling (Z91.81);Difficulty in walking, not elsewhere classified (R26.2);Pain Pain  - Right/Left: Right Pain - part of body: Shoulder;Arm (back  and ribs)    Time: 1241-1314 PT Time Calculation (min) (ACUTE ONLY): 33 min   Charges:     PT Treatments $Gait Training: 8-22 mins PT General Charges $$ ACUTE PT VISIT: 1 Visit         [place], PT Acute Rehab   [place] VEAR Drone 04/29/2024, 3:29 PM

## 2024-04-29 NOTE — Progress Notes (Signed)
 PHARMACY - ANTICOAGULATION CONSULT NOTE  Pharmacy Consult for heparin Indication: pulmonary embolus  No Known Allergies  Patient Measurements: Height: 5' 9 (175.3 cm) Weight: (!) 143.5 kg (316 lb 5.8 oz) IBW/kg (Calculated) : 70.7 HEPARIN DW (KG): 104.9  Vital Signs: Temp: 98 F (36.7 C) (11/07 0445) BP: 153/95 (11/07 0445) Pulse Rate: 92 (11/07 0445)  Labs: Recent Labs    04/27/24 2339 04/28/24 0818 04/28/24 1156 04/28/24 1448 04/29/24 0516  HGB 12.3*  --   --   --  12.4*  HCT 38.1*  --   --   --  40.3  PLT 155  --   --   --  148*  HEPARINUNFRC  --    < > 0.53 0.11* 0.52  CREATININE 0.94  --   --   --  0.87   < > = values in this interval not displayed.    Estimated Creatinine Clearance: 154.5 mL/min (by C-G formula based on SCr of 0.87 mg/dL).  Assessment: 43yo male c/o lower back pain and SOB with any level of exertion. Found to have a PE. Pharmacy consulted for heparin management.  04/29/2024: Heparin level 0.52- therapeutic on IV heparin 2000 units/hr CBC: Hg slightly low/stable at 12.4, pltc 148 -slightly low/stable No bleeding or infusion related concerns reported by RN  Goal of Therapy:  Heparin level 0.3-0.7 units/ml Monitor platelets by anticoagulation protocol: Yes   Plan:  -Continue heparin infusion at 2000 units/hr -Recheck heparin level at 1100 -Daily heparin level and CBC   Rosaline Millet, PharmD, BCPS Clinical Pharmacist 04/29/2024 6:22 AM

## 2024-04-29 NOTE — Discharge Instructions (Addendum)
 Information on my medicine - ELIQUIS  (apixaban )  Why was Eliquis  prescribed for you? Eliquis  was prescribed to treat blood clots that may have been found in the veins of your legs (deep vein thrombosis) or in your lungs (pulmonary embolism) and to reduce the risk of them occurring again.  What do You need to know about Eliquis  ? The starting dose is 10 mg (two 5 mg tablets) taken TWICE daily for the FIRST SEVEN (7) DAYS, then on 11/14 evening  the dose is reduced to ONE 5 mg tablet taken TWICE daily.  Eliquis  may be taken with or without food.   Try to take the dose about the same time in the morning and in the evening. If you have difficulty swallowing the tablet whole please discuss with your pharmacist how to take the medication safely.  Take Eliquis  exactly as prescribed and DO NOT stop taking Eliquis  without talking to the doctor who prescribed the medication.  Stopping may increase your risk of developing a new blood clot.  Refill your prescription before you run out.  After discharge, you should have regular check-up appointments with your healthcare provider that is prescribing your Eliquis .    What do you do if you miss a dose? If a dose of ELIQUIS  is not taken at the scheduled time, take it as soon as possible on the same day and twice-daily administration should be resumed. The dose should not be doubled to make up for a missed dose.  Important Safety Information A possible side effect of Eliquis  is bleeding. You should call your healthcare provider right away if you experience any of the following: Bleeding from an injury or your nose that does not stop. Unusual colored urine (red or dark brown) or unusual colored stools (red or black). Unusual bruising for unknown reasons. A serious fall or if you hit your head (even if there is no bleeding).  Some medicines may interact with Eliquis  and might increase your risk of bleeding or clotting while on Eliquis . To help avoid  this, consult your healthcare provider or pharmacist prior to using any new prescription or non-prescription medications, including herbals, vitamins, non-steroidal anti-inflammatory drugs (NSAIDs) and supplements.  This website has more information on Eliquis  (apixaban ): http://[email or web address] .com/eliquis /home   Wound Care Your Prevena Wound VAC battery will die 7 days after it was placed. Once the battery shuts off, you can remove the tape/dressing. After, no dressing is required over your incision. Keep clean and dry. You may shower and allow soap/water to run over the incision. Do not scrub. Blot dry Do not put any creams, lotions, or ointments on incision. Do not submerge your incision for the first 6 weeks (pool, ocean, etc)  Activity Walk each and every day, increasing distance each day. No lifting greater than 10 lbs.  Avoid excessive back/neck motion.  Diet Resume your normal diet.  Call Your Doctor If Any of These Occur Redness, drainage, or swelling at the wound.  Temperature greater than 101 degrees. Severe pain not relieved by pain medication. Incision starts to come apart.  Follow Up Appt Call [phone] today for appointment in 3 weeks if you don't already have one or for any problems.  If you have any hardware placed in your spine, you will need an x-ray before your appointment.   Information on my medicine - ELIQUIS  (apixaban )  This medication education was reviewed with me or my healthcare representative as part of my discharge preparation.   Why was Eliquis  prescribed for you? Eliquis   was prescribed to treat blood clots that may have been found in the veins of your legs (deep vein thrombosis) or in your lungs (pulmonary embolism) and to reduce the risk of them occurring again.  What do You need to know about Eliquis  ? The starting dose is 10 mg (two 5 mg tablets) taken TWICE daily for the FIRST SEVEN (7) DAYS, then on  05/21/25  the dose is reduced to ONE 5 mg  tablet taken TWICE daily.  Eliquis  may be taken with or without food.   Try to take the dose about the same time in the morning and in the evening. If you have difficulty swallowing the tablet whole please discuss with your pharmacist how to take the medication safely.  Take Eliquis  exactly as prescribed and DO NOT stop taking Eliquis  without talking to the doctor who prescribed the medication.  Stopping may increase your risk of developing a new blood clot.  Refill your prescription before you run out.  After discharge, you should have regular check-up appointments with your healthcare provider that is prescribing your Eliquis .    What do you do if you miss a dose? If a dose of ELIQUIS  is not taken at the scheduled time, take it as soon as possible on the same day and twice-daily administration should be resumed. The dose should not be doubled to make up for a missed dose.  Important Safety Information A possible side effect of Eliquis  is bleeding. You should call your healthcare provider right away if you experience any of the following: Bleeding from an injury or your nose that does not stop. Unusual colored urine (red or dark brown) or unusual colored stools (red or black). Unusual bruising for unknown reasons. A serious fall or if you hit your head (even if there is no bleeding).  Some medicines may interact with Eliquis  and might increase your risk of bleeding or clotting while on Eliquis . To help avoid this, consult your healthcare provider or pharmacist prior to using any new prescription or non-prescription medications, including herbals, vitamins, non-steroidal anti-inflammatory drugs (NSAIDs) and supplements.  This website has more information on Eliquis  (apixaban ): http://[email or web address] .[email or web address] dena

## 2024-04-29 NOTE — Progress Notes (Signed)
 VASCULAR LAB    Bilateral lower extremity venous duplex has been performed.  See CV proc for preliminary results.   Earlie Arciga, RVT 04/29/2024, 5:01 PM

## 2024-04-29 NOTE — Progress Notes (Signed)
 Progress Note   Patient: Joseph [name] FMW:[id number] DOB: 1980-10-19 DOA: 04/27/2024     0 DOS: the patient was seen and examined on 04/29/2024   Brief hospital course: 43 year old man PMH including drug use (snorting fentanyl ) also documentation in the medical record of IV drug use although he denies, who presented with back pain for about 4 days and difficulty walking.  Workup revealed incidental PE finding and lung nodules.  Imaging of the back was unrevealing and he was admitted for treatment of PE and further evaluation of back pain.  Consultants None but did discuss with neurosurgery informally   Procedures/Events   Assessment and Plan: Right upper lobe segmental pulmonary embolism  Suspected pulmonary artery hypertension CT no findings of right heart strain. Currently on IV heparin, no procedures planned, hemodynamic stable, no hypoxia. Will change to oral anticoagulation. Check echocardiogram  Low back pain CT lumbar spine with large Schmorl's node L5, no discitis, no fracture MRI lumbar spine no discitis.  Disc protrusion L4-5 with moderate left and mild right foraminal stenosis.  Right paramedian disc protrusion L5-S1 with mild bilateral foraminal narrowing and displacement of right S1 nerve roots. Discussed with neurosurgery Dr. Darnella.  Recommends outpatient follow-up, no specific further evaluation, no indication for surgery.  Findings on imaging not likely to explain low back pain in his opinion. PT evaluation.  Avoid narcotics.  Bilateral solid pulmonary nodules  Measuring up to 6 mm. Metastatic disease not excluded but no primary identified.   Discussed with pulmonology, recommendation for outpatient follow-up and imaging.  No further inpatient evaluation suggested.  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    Subjective:  Reports back pain for perhaps 4 days, no apparent injury, no previous back problems.  Reports some difficulty walking.  Normal sensation in legs.  Some sensation below umbilicus but stops above groin.  No bowel or bladder incontinence.  Snorts fentanyl  but denies IV drug use.  Denies other drug use.  Physical Exam: Vitals:   04/28/24 1646 04/28/24 2101 04/29/24 0030 04/29/24 0445  BP: (!) 142/93 (!) 167/94 (!) 174/95 (!) 153/95  Pulse: 80 79 86 92  Resp: (!) 22 19 17 17   Temp: 98.6 F (37 C) 98.6 F (37 C) 98.5 F (36.9 C) 98 F (36.7 C)  TempSrc: Oral     SpO2: 97% 99% 94% 96%  Weight:      Height:       Physical Exam Vitals reviewed.  Constitutional:      General: He is not in acute distress.    Appearance: He is not ill-appearing or toxic-appearing.  Cardiovascular:     Rate and Rhythm: Normal rate and regular rhythm.     Heart sounds: No murmur heard. Pulmonary:     Effort: Pulmonary effort is normal. No respiratory distress.     Breath sounds: No wheezing, rhonchi or rales.  Musculoskeletal:     Right lower leg: No edema.     Left lower leg: No edema.     Comments: Able to lift both legs off the bed and flex both knees  Neurological:     Mental Status: He is alert.     Comments: Sensation grossly intact bilateral lower extremities.  Psychiatric:        Mood and Affect: Mood normal.        Behavior: Behavior normal.  Data Reviewed: Basic metabolic panel, hepatic function panel unremarkable on admission CBC 11/7 unremarkable BMP 11/7 notable for mild hyperglycemia CT chest noted  Family Communication:   Disposition: Status is: Observation      Time spent: 35 minutes  Author: Toribio Door, MD 04/29/2024 1:37 PM  For on call review [email or web address].

## 2024-04-30 ENCOUNTER — Observation Stay (HOSPITAL_COMMUNITY): Payer: MEDICAID

## 2024-04-30 DIAGNOSIS — M545 Low back pain, unspecified: Secondary | ICD-10-CM | POA: Diagnosis not present

## 2024-04-30 DIAGNOSIS — I2609 Other pulmonary embolism with acute cor pulmonale: Secondary | ICD-10-CM | POA: Diagnosis not present

## 2024-04-30 DIAGNOSIS — I2699 Other pulmonary embolism without acute cor pulmonale: Secondary | ICD-10-CM | POA: Diagnosis not present

## 2024-04-30 DIAGNOSIS — R270 Ataxia, unspecified: Secondary | ICD-10-CM | POA: Diagnosis not present

## 2024-04-30 LAB — ECHOCARDIOGRAM COMPLETE
AR max vel: 3.28 cm2
AV Area VTI: 3.77 cm2
AV Area mean vel: 4.01 cm2
AV Mean grad: 4 mmHg
AV Peak grad: 8.4 mmHg
Ao pk vel: 1.45 m/s
Area-P 1/2: 3.1 cm2
Height: 69 in
S' Lateral: 4.5 cm
Weight: 5061.76 [oz_av]

## 2024-04-30 LAB — CBC
HCT: 39.5 % (ref 39.0–52.0)
Hemoglobin: 12.5 g/dL — ABNORMAL LOW (ref 13.0–17.0)
MCH: 29.6 pg (ref 26.0–34.0)
MCHC: 31.6 g/dL (ref 30.0–36.0)
MCV: 93.4 fL (ref 80.0–100.0)
Platelets: 178 K/uL (ref 150–400)
RBC: 4.23 MIL/uL (ref 4.22–5.81)
RDW: 12 % (ref 11.5–15.5)
WBC: 8.3 K/uL (ref 4.0–10.5)
nRBC: 0 % (ref 0.0–0.2)

## 2024-04-30 LAB — TSH: TSH: 3.7 u[IU]/mL (ref 0.350–4.500)

## 2024-04-30 NOTE — Progress Notes (Signed)
 Progress Note   Patient: Joseph [name] FMW:[id number] DOB: June 02, 1981 DOA: 04/27/2024     0 DOS: the patient was seen and examined on 04/30/2024   Brief hospital course: 43 year old man PMH including drug use (snorting fentanyl ) also documentation in the medical record of IV drug use although he denies, who presented with back pain for about 4 days and difficulty walking.  Workup revealed incidental PE finding and lung nodules.  Imaging of the back was unrevealing and he was admitted for treatment of PE and further evaluation of back pain.  Consultants None but did discuss with neurosurgery informally   Procedures/Events   Assessment and Plan: Right upper lobe segmental pulmonary embolism  Suspected pulmonary artery hypertension CT no findings of right heart strain.  2D echocardiogram with normal LVEF and RV systolic function. Continue oral anticoagulation Asymptomatic   Low back pain Ataxic gait Ambulatory dysfunction CT lumbar spine with large Schmorl's node L5, no discitis, no fracture MRI lumbar spine no discitis.  Disc protrusion L4-5 with moderate left and mild right foraminal stenosis.  Right paramedian disc protrusion L5-S1 with mild bilateral foraminal narrowing and displacement of right S1 nerve roots. Discussed with neurosurgery Dr. Darnella.  Recommended outpatient follow-up, no specific further evaluation, no indication for surgery.  Findings on imaging not likely to explain low back pain in his opinion. PT evaluation noted: Ataxic, poor bilateral lower extremity fluid control, coordination and stability.  Discharge held.  MRI brain unremarkable.  Neurology consultation placed.   Bilateral solid pulmonary nodules  Measuring up to 6 mm. Metastatic disease not excluded but no primary identified.   Discussed with pulmonology, recommendation for outpatient follow-up and imaging.  No further inpatient evaluation suggested.   Large left thyroid lobe with rightward tracheal  deviation Nonurgent thyroid ultrasound  Check TSH      Subjective:  Feels weak BLE Severe pain in right shoulder from an old football injury.  Limited use of that arm.  Physical Exam: Vitals:   04/29/24 1338 04/29/24 2015 04/30/24 0504 04/30/24 1337  BP: (!) 141/88 (!) 141/92 (!) 144/85 (!) 160/82  Pulse: 78 100 79 87  Resp: 20 16 18 20   Temp: 98.4 F (36.9 C) 98.4 F (36.9 C) 98.5 F (36.9 C) 98.2 F (36.8 C)  TempSrc: Oral Oral Oral Oral  SpO2: 98% 97% 99% 96%  Weight:      Height:       Physical Exam Vitals reviewed.  Constitutional:      General: He is not in acute distress.    Appearance: He is not ill-appearing or toxic-appearing.  Cardiovascular:     Rate and Rhythm: Normal rate and regular rhythm.     Heart sounds: No murmur heard. Pulmonary:     Effort: Pulmonary effort is normal. No respiratory distress.     Breath sounds: No wheezing, rhonchi or rales.  Neurological:     Mental Status: He is alert.  Psychiatric:        Mood and Affect: Mood normal.        Behavior: Behavior normal.     Data Reviewed: Hemoglobin stable 12.5. TSH within normal limits. Brain MRI normal 2D echocardiogram LVEF 55 to 60%.  Regional wall motion abnormalities.  Normal diastolic function.  Normal RV systolic function. Bilateral lower extremity venous Dopplers negative  Family Communication: none  Disposition: Status is: Observation      Time spent: 35 minutes  Author: Toribio Door, MD 04/30/2024 3:44 PM  For on call review [email or web address].

## 2024-05-01 ENCOUNTER — Observation Stay (HOSPITAL_COMMUNITY): Payer: MEDICAID

## 2024-05-01 DIAGNOSIS — R270 Ataxia, unspecified: Secondary | ICD-10-CM

## 2024-05-01 DIAGNOSIS — G9520 Unspecified cord compression: Secondary | ICD-10-CM

## 2024-05-01 DIAGNOSIS — R918 Other nonspecific abnormal finding of lung field: Secondary | ICD-10-CM | POA: Diagnosis not present

## 2024-05-01 DIAGNOSIS — M545 Low back pain, unspecified: Secondary | ICD-10-CM | POA: Diagnosis not present

## 2024-05-01 DIAGNOSIS — S22029A Unspecified fracture of second thoracic vertebra, initial encounter for closed fracture: Secondary | ICD-10-CM

## 2024-05-01 DIAGNOSIS — E041 Nontoxic single thyroid nodule: Secondary | ICD-10-CM

## 2024-05-01 DIAGNOSIS — M25511 Pain in right shoulder: Secondary | ICD-10-CM | POA: Insufficient documentation

## 2024-05-01 DIAGNOSIS — Z87891 Personal history of nicotine dependence: Secondary | ICD-10-CM

## 2024-05-01 DIAGNOSIS — I2699 Other pulmonary embolism without acute cor pulmonale: Secondary | ICD-10-CM | POA: Diagnosis not present

## 2024-05-01 LAB — CBC
HCT: 38.1 % — ABNORMAL LOW (ref 39.0–52.0)
Hemoglobin: 12 g/dL — ABNORMAL LOW (ref 13.0–17.0)
MCH: 29.8 pg (ref 26.0–34.0)
MCHC: 31.5 g/dL (ref 30.0–36.0)
MCV: 94.5 fL (ref 80.0–100.0)
Platelets: 179 K/uL (ref 150–400)
RBC: 4.03 MIL/uL — ABNORMAL LOW (ref 4.22–5.81)
RDW: 12 % (ref 11.5–15.5)
WBC: 5.3 K/uL (ref 4.0–10.5)
nRBC: 0 % (ref 0.0–0.2)

## 2024-05-01 MED ORDER — GADOBUTROL 1 MMOL/ML IV SOLN
10.0000 mL | Freq: Once | INTRAVENOUS | Status: AC | PRN
  Administered 2024-05-01: 10 mL via INTRAVENOUS

## 2024-05-01 MED ORDER — HEPARIN (PORCINE) 25000 UT/250ML-% IV SOLN
2100.0000 [IU]/h | INTRAVENOUS | Status: AC
  Administered 2024-05-01 – 2024-05-02 (×2): 2200 [IU]/h via INTRAVENOUS
  Administered 2024-05-02: 2400 [IU]/h via INTRAVENOUS
  Administered 2024-05-03 – 2024-05-04 (×4): 2100 [IU]/h via INTRAVENOUS
  Filled 2024-05-01 (×7): qty 250

## 2024-05-01 MED ORDER — LACTATED RINGERS IV SOLN: INTRAVENOUS | Status: DC

## 2024-05-01 MED ORDER — OXYCODONE HCL 5 MG PO TABS
10.0000 mg | ORAL_TABLET | Freq: Four times a day (QID) | ORAL | Status: DC | PRN
  Administered 2024-05-01: 10 mg via ORAL
  Administered 2024-05-02 – 2024-05-06 (×12): 15 mg via ORAL
  Administered 2024-05-07: 10 mg via ORAL
  Administered 2024-05-08 – 2024-05-11 (×10): 15 mg via ORAL
  Filled 2024-05-01 (×10): qty 3
  Filled 2024-05-01: qty 2
  Filled 2024-05-01 (×13): qty 3

## 2024-05-01 MED ORDER — HYDROMORPHONE HCL 1 MG/ML IJ SOLN
1.0000 mg | INTRAMUSCULAR | Status: DC | PRN
  Administered 2024-05-01 (×2): 1 mg via INTRAVENOUS
  Filled 2024-05-01 (×2): qty 1

## 2024-05-01 MED ORDER — ACETAMINOPHEN 500 MG PO TABS
1000.0000 mg | ORAL_TABLET | Freq: Three times a day (TID) | ORAL | Status: DC
  Administered 2024-05-01 – 2024-05-16 (×43): 1000 mg via ORAL
  Filled 2024-05-01 (×43): qty 2

## 2024-05-01 MED ORDER — DEXAMETHASONE SODIUM PHOSPHATE 4 MG/ML IJ SOLN: 4.0000 mg | Freq: Four times a day (QID) | INTRAMUSCULAR | Status: DC

## 2024-05-01 MED ORDER — METHOCARBAMOL 500 MG PO TABS
500.0000 mg | ORAL_TABLET | Freq: Three times a day (TID) | ORAL | Status: DC
  Administered 2024-05-01 – 2024-05-16 (×44): 500 mg via ORAL
  Filled 2024-05-01 (×45): qty 1

## 2024-05-01 MED ORDER — FENTANYL 12 MCG/HR TD PT72
1.0000 | MEDICATED_PATCH | TRANSDERMAL | Status: AC
  Administered 2024-05-01: 1 via TRANSDERMAL
  Filled 2024-05-01: qty 1

## 2024-05-01 MED ORDER — OXYCODONE HCL 5 MG PO TABS
5.0000 mg | ORAL_TABLET | Freq: Four times a day (QID) | ORAL | Status: DC | PRN
  Administered 2024-05-01 (×2): 5 mg via ORAL
  Filled 2024-05-01 (×2): qty 1

## 2024-05-01 MED ORDER — DEXAMETHASONE SODIUM PHOSPHATE 4 MG/ML IJ SOLN
4.0000 mg | Freq: Two times a day (BID) | INTRAMUSCULAR | Status: DC
  Administered 2024-05-01 – 2024-05-04 (×7): 4 mg via INTRAVENOUS
  Filled 2024-05-01 (×7): qty 1

## 2024-05-01 MED ORDER — HYDROMORPHONE HCL 1 MG/ML IJ SOLN
0.5000 mg | INTRAMUSCULAR | Status: DC | PRN
  Administered 2024-05-01 – 2024-05-05 (×27): 1 mg via INTRAVENOUS
  Filled 2024-05-01 (×27): qty 1

## 2024-05-01 NOTE — Progress Notes (Signed)
 Inpatient Rehab Admissions Coordinator:   Per therapy recommendations, patient was screened for CIR candidacy by Leita Kleine, MS, CCC-SLP. At this time, Pt. does not appear to demonstrate medical necessity to justify in hospital rehabilitation/CIR and I will not pursue a rehab consult for this Pt; however, workup is incomplete. I will rescreen Pt. Once work up is complete.  Please contact me with any questions.  Leita Kleine, MS, CCC-SLP Rehab Admissions Coordinator  [phone] (celll) [phone] (office)

## 2024-05-01 NOTE — Progress Notes (Signed)
 This pt is coming from [facility].  It was assigned before this RN got here, not sure if the CN dayshift did SBAR hand off, so this RN did it just now

## 2024-05-01 NOTE — Progress Notes (Signed)
 Per MD Opyd it is ok for this pt to be on observation with no tele

## 2024-05-01 NOTE — Consult Note (Signed)
 Reason for Consult: T2 pathologic fracture Referring Physician: Dr. Jadine Pfeiffer Joseph [name] is an 43 y.o. male.  HPI: 43 year old male admitted with chest pain and lower back pain with increasing gait instability and ataxia.  Workup demonstrated no evidence of pulmonary embolism as well as likely metastatic disease to both lungs.  Patient was noted to have significant lumbar pain with weakness and ataxia in both lower extremities.  Imaging of the lumbar spine was negative.  He has subsequently undergone imaging of his thoracic spine which demonstrates evidence of neoplastic disease involving the vertebral body and left sided pedicle and posterior elements of T2 with associated pathologic fracture.  There is significant ventral lateral epidural disease with significant cord compression.  The patient also has likely metastatic disease to his right proximal humerus.  Primary at this time is unknown although he does have some abnormal imaging in his thyroid gland.  Currently the patient complains of right shoulder pain.  He also notes some lower back and mid thoracic pain.  He denies any electrical sensations consistent with a Lhermitte's type phenomenon.  He does note some poor motor control in both lower extremities.  He denies any upper extremity symptoms.  He states that he has some numbness extending from his mid chest distally.  He remains continent of urine and stool.  The patient has been diagnosed with pulmonary embolism and has been put on IV heparin.  He has recently received oral Eliquis.  Past Medical History:  Diagnosis Date   Anxiety    Asthma    as a child   Depression    Narcotic abuse (HCC)    Pulmonary nodules 04/29/2024    Past Surgical History:  Procedure Laterality Date   CLOSED REDUCTION FINGER WITH PERCUTANEOUS PINNING Right 06/22/2013   Procedure: RIGHT HAND SMALL FINGER CLOSED MANIPULATION AND PINNING;  Surgeon: Prentice LELON Pagan, MD;  Location: [facility] OR;  Service:  Orthopedics;  Laterality: Right;   I & D EXTREMITY  03/24/2012   Procedure: IRRIGATION AND DEBRIDEMENT EXTREMITY;  Surgeon: Oneil JAYSON Herald, MD;  Location: [facility] OR;  Service: Orthopedics;  Laterality: Right;  Right Forearm    Family History  Problem Relation Age of Onset   Other Other    Alcohol abuse Other     Social History:  reports that he has been smoking cigarettes. He has a 10 pack-year smoking history. He has never used smokeless tobacco. He reports that he does not currently use alcohol. He reports current drug use. Drug: Marijuana.  Allergies: No Known Allergies  Medications: I have reviewed the patient's current medications.  Results for orders placed or performed during the hospital encounter of 04/27/24 (from the past 48 hours)  CBC     Status: Abnormal   Collection Time: 04/30/24  6:04 AM  Result Value Ref Range   WBC 8.3 4.0 - 10.5 K/uL   RBC 4.23 4.22 - 5.81 MIL/uL   Hemoglobin 12.5 (L) 13.0 - 17.0 g/dL   HCT 60.4 60.9 - 47.9 %   MCV 93.4 80.0 - 100.0 fL   MCH 29.6 26.0 - 34.0 pg   MCHC 31.6 30.0 - 36.0 g/dL   RDW 87.9 88.4 - 84.4 %   Platelets 178 150 - 400 K/uL   nRBC 0.0 0.0 - 0.2 %    Comment: Performed at [facility], [address] [place], [place] [address]  TSH     Status: None   Collection Time: 04/30/24  6:04 AM  Result Value Ref Range   TSH 3.700 0.350 - 4.500 uIU/mL    Comment: Performed at [facility], [address], [place], [place] [address]  CBC     Status: Abnormal   Collection Time: 05/01/24  6:21 AM  Result Value Ref Range   WBC 5.3 4.0 - 10.5 K/uL   RBC 4.03 (L) 4.22 - 5.81 MIL/uL   Hemoglobin 12.0 (L) 13.0 - 17.0 g/dL   HCT 61.8 (L) 60.9 - 47.9 %   MCV 94.5 80.0 - 100.0 fL   MCH 29.8 26.0 - 34.0 pg   MCHC 31.5 30.0 - 36.0 g/dL   RDW 87.9 88.4 - 84.4 %   Platelets 179 150 - 400 K/uL   nRBC 0.0 0.0 - 0.2 %    Comment: Performed at [facility], [address], [place],  [place] [address]      Review of systems not obtained due to patient factors. Blood pressure (!) 151/85, pulse 68, temperature 98.2 F (36.8 C), temperature source Oral, resp. rate 20, height 5' 9 (1.753 m), weight (!) 143.5 kg, SpO2 99%. Patient is awake and alert.  He is oriented and appropriate.  Speech is fluent.  Judgment and insight appear intact.  Cranial nerve function normal bilateral.  Motor examination reveals intact motor strength in both upper extremities.  Lower extremity motor strength demonstrates 4/5 strength in both lower extremities with some moderate increased tone and some ataxia.  Reflexes are hyperactive in both lower extremities and normal active in both upper extremities.  Toes are upgoing to plantar stimulation.  The patient has a relative sensory level around T3.  Examination of head ears eyes nose and throat is normal.  Chest and abdomen are otherwise benign.  Extremities with significant tenderness around his right proximal humerus.  Otherwise extremities free from injury or deformity.  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  Recommend starting the patient on IV steroids (Decadron 4 mg every 6).  Recommend transfer to [facility].  Stop Eliquis.  Okay to continue heparin until time of surgery.  As the patient has a reasonably good neurologic exam at present I would not recommend emergent surgery given his anticoagulation.  I would like to have his Eliquis resolve over at least 48 hours.  I have discussed situation with Dr. Darnella who will assume care tomorrow as I will be out of town on Tuesday morning through the end of the week and will be unable to perform surgery on this gentleman.  [facility] Nohemy Koop 05/01/2024, 7:23 PM

## 2024-05-01 NOTE — Progress Notes (Addendum)
 Pathologic fracture at T2. Unknown primary but with metastatic disease  Plan: CT CAP Dedicated CT C and Tspine Recommend biopsy of extraspinal lesion which will help determine surgical plan - fixation alone vs extensive decompression and anterior column reconstruction Ok for therapeutic heparin or lovenox . Please hold non-reversible anticoagulation or antiplatelets

## 2024-05-01 NOTE — Consult Note (Addendum)
 NEUROLOGY CONSULT NOTE   Date of service: May 01, 2024 Patient Name: Joseph [name] MRN:  [id number] DOB:  Jun 09, 1981 Chief Complaint: Back pain, lower extremity weakness and numbness with unsteady gait Requesting Provider: Jadine Toribio SQUIBB, MD  History of Present Illness  Joseph [name] is a 43 y.o. male with hx of daily fentanyl  use, smoking, depression and right shoulder pain due to football injury who presents with cough, lower back pain, unsteady gait, numbness and tingling in the inner thighs going down the legs and difficulty moving lower extremities.  He reports that back pain started about 2 or 3 weeks ago and that there was no inciting injury.  He reports that difficulty walking, due to unsteady gait and weakness of bilateral lower extremities began about 5 or 6 days ago, first noticed when his LLE gave out.  He also endorses some numbness and tingling in his inner thighs and groin area, sometimes spreading down the backs of his legs. No bowel or bladder incontinence, but he does endorse mild tingling in the saddle region. He reports a recent respiratory illness with a cough but no other recent symptoms and no recent gastrointestinal illness. He is currently experiencing severe BLE pain due to muscle spasms, which began when the weakness was first noticed and which have subsequently worsened.   During this admission, he has been diagnosed with a PE and has been started on Eliquis.   ROS  Comprehensive ROS performed and pertinent positives documented in HPI   Past History   Past Medical History:  Diagnosis Date   Anxiety    Asthma    as a child   Depression    Narcotic abuse (HCC)    Pulmonary nodules 04/29/2024    Past Surgical History:  Procedure Laterality Date   CLOSED REDUCTION FINGER WITH PERCUTANEOUS PINNING Right 06/22/2013   Procedure: RIGHT HAND SMALL FINGER CLOSED MANIPULATION AND PINNING;  Surgeon: Prentice LELON Pagan, MD;  Location: [facility] OR;  Service:  Orthopedics;  Laterality: Right;   I & D EXTREMITY  03/24/2012   Procedure: IRRIGATION AND DEBRIDEMENT EXTREMITY;  Surgeon: Oneil JAYSON Herald, MD;  Location: [facility] OR;  Service: Orthopedics;  Laterality: Right;  Right Forearm    Family History: Family History  Problem Relation Age of Onset   Other Other    Alcohol abuse Other     Social History  reports that he has been smoking cigarettes. He has a 10 pack-year smoking history. He has never used smokeless tobacco. He reports that he does not currently use alcohol. He reports current drug use. Drug: Marijuana.  No Known Allergies  Medications   Current Facility-Administered Medications:    acetaminophen  (TYLENOL ) tablet 650 mg, 650 mg, Oral, Q6H PRN, Raford Lenis, MD, 650 mg at 04/30/24 1358   apixaban (ELIQUIS) tablet 10 mg, 10 mg, Oral, BID, 10 mg at 05/01/24 0600 **FOLLOWED BY** [START ON 05/06/2024] apixaban (ELIQUIS) tablet 5 mg, 5 mg, Oral, BID, Nicholaus, Sydney, RPH   cyclobenzaprine (FLEXERIL) tablet 5 mg, 5 mg, Oral, TID PRN, Claiborne, Claudia, MD, 5 mg at 05/01/24 0600   ketorolac  (TORADOL ) 15 MG/ML injection 15 mg, 15 mg, Intravenous, Q6H, Claiborne, Claudia, MD, 15 mg at 05/01/24 1211   ondansetron  (ZOFRAN ) tablet 4 mg, 4 mg, Oral, Q6H PRN **OR** ondansetron  (ZOFRAN ) injection 4 mg, 4 mg, Intravenous, Q6H PRN, Claiborne, Claudia, MD   senna-docusate (Senokot-S) tablet 1 tablet, 1 tablet, Oral, QHS PRN, Claiborne, Claudia, MD   sodium chloride  flush (NS) 0.9 %  injection 3 mL, 3 mL, Intravenous, Q12H, Arthea Child, MD, 3 mL at 05/01/24 0959  Vitals   Vitals:   2024/05/17 0504 2024/05/17 1337 2024-05-17 2013 05/01/24 0528  BP: (!) 144/85 (!) 160/82 111/77 (!) 140/91  Pulse: 79 87 88 76  Resp: 18 20 15 17   Temp: 98.5 F (36.9 C) 98.2 F (36.8 C) 98.2 F (36.8 C) 98.9 F (37.2 C)  TempSrc: Oral Oral Oral Oral  SpO2: 99% 96% 95% 100%  Weight:      Height:        Body mass index is 46.72 kg/m.   Physical Exam    Constitutional: Morbidly obese.  Psych: Affect appropriate to situation.  Eyes: No scleral injection.  HENT: No OP obstruction.  Head: Normocephalic.  Respiratory: Effort normal, non-labored breathing.  Skin: WDI.   Neurologic Examination    Mental Status: AA&Ox3, able to give clear and coherent history of present illness Speech/Language: speech is without dysarthria or aphasia.   Cranial Nerves:  II: PERRL.  III, IV, VI: EOMI. Eyelids elevate symmetrically.  V: Sensation is intact to light touch and symmetrical to face.  VII: Smile is symmetrical.  VIII: hearing intact to voice. IX, X: Phonation is normal.  KP:Dynloizm shrug stronger on the left. XII: Tongue is midline without fasciculations. Motor: 4+/5 strength to bilateral upper extremities with limited abduction of right shoulder due to old injury, 4-/5 strength to bilateral lower extremities, proximal and distal, worse on the left. Lower extremity movement triggers BLE muscle spasms with pain Tone is mildly decreased in BLE. Bulk is normal Sensation- Intact to light touch bilaterally in the upper extremities, diminished to light touch in the right lower extremity at the levels of the knee and foot, sensation intact to vibration in bilateral upper extremities and left lower extremity, diminished to vibration in right lower extremity  Cerebellar: FTN with no ataxia on the left, unable to perform on the right due to pain Reflexes: 2+ bilateral biceps and brachioradialis. Patellar reflexes are brisk without clonus or spread (3+ bilaterally). Toes are equivocal.  Gait- Deferred   Labs/Imaging/Neurodiagnostic studies   CBC:  Recent Labs  Lab May 17, 2024 0604 05/01/24 0621  WBC 8.3 5.3  HGB 12.5* 12.0*  HCT 39.5 38.1*  MCV 93.4 94.5  PLT 178 179   Basic Metabolic Panel:  Lab Results  Component Value Date   NA 135 04/29/2024   K 4.3 04/29/2024   CO2 28 04/29/2024   GLUCOSE 137 (H) 04/29/2024   BUN 11 04/29/2024    CREATININE 0.87 04/29/2024   CALCIUM 9.4 04/29/2024   GFRNONAA >60 04/29/2024   GFRAA >60 08/16/2019   Lipid Panel: No results found for: LDLCALC HgbA1c: No results found for: HGBA1C Urine Drug Screen:     Component Value Date/Time   LABOPIA POSITIVE (A) 04/01/2018 1647   COCAINSCRNUR NONE DETECTED 04/01/2018 1647   LABBENZ NONE DETECTED 04/01/2018 1647   AMPHETMU POSITIVE (A) 04/01/2018 1647   THCU NONE DETECTED 04/01/2018 1647   LABBARB NONE DETECTED 04/01/2018 1647    Alcohol Level     Component Value Date/Time   ETH <10 04/01/2018 1319    MRI Brain (Personally reviewed): No acute abnormality  MRI lumbar spine: No evidence of discitis, broad-based disc protrusion at L4-L5 with moderate left and mild right foraminal stenosis, right paramedian disc protrusion at L5-S1 with mild bilateral foraminal narrowing and displacement of right S1 nerve roots  MRI cervical and thoracic spine: Pending  CTA of chest: 1. Right upper  lobe segmental pulmonary embolism. Main pulmonary artery is enlarged compatible with pulmonary arterial hypertension. No evidence for right heart strain. 2. Numerous scattered bilateral solid pulmonary nodules measuring up to 6 mm in the right lower lobe. Metastatic disease not excluded given appearance and distribution. 3. Enlarged and heterogeneous left thyroid lobe measuring up to 5 cm with rightward tracheal deviation. Recommend non-emergent thyroid ultrasound. 4. Small hiatal hernia and gallstones.   ASSESSMENT  Joseph [name] is a 43 y.o. male with hx of daily fentanyl  use, smoking, morbid obesity, depression and chronic right shoulder pain due to remote football injury who presents with cough, lower back pain, unsteady gait, numbness and tingling in the inner thighs going down the legs with difficulty moving lower extremities.  Patient reports that he has had his back pain for about 2 to 3 weeks, that it is in the lumbar area of the spine and is  accompanied by tingling and numbness in the inner thighs radiating down the backs of his legs.  He had no inciting injury.  He also reports difficulty walking for 5 to 6 days with an unsteady gait and also weakness of bilateral lower extremities with no bowel or bladder incontinence.  He reports that the strength in his lower extremities is somewhat improved, but still not back to baseline.   - Exam reveals decreased sensation to BLE, worse on the right, in addition to hyperreflexic patellar reflexes, mildly decreased tone in BLE and BLE weakness, worse on the left.  - Symptomatically, he is experiencing severe pain due to BLE and right shoulder muscle spasms. - MRI brain demonstrates no acute abnormality, and MRI lumbar spine does demonstrate some disc protrusion which could cause back pain but would not be likely to cause lower extremity weakness and diminished sensation as seen on exam.   - Would obtain MRI of the cervical and thoracic spine to determine if spinal canal narrowing elsewhere may be attributing to patient's symptoms.  Given recent respiratory illness, Guillan-Barre syndrome was considered, but given brisk reflexes and pattern of symptom onset, this is unlikely. - CT chest reveals right upper lobe segmental pulmonary embolism. Main pulmonary artery is enlarged compatible with pulmonary arterial hypertension. No evidence for right heart strain. - Has been started on anticoagulation for his PE - CTA of chest also reveals numerous scattered bilateral solid pulmonary nodules measuring up to 6 mm in the right lower lobe. Metastatic disease not excluded given appearance and distribution. - Impression:  - Subacute BLE weakness, progressive. Hyperreflexia is also present. Exam findings and symptoms best localize to the cervical or thoracic spinal cord - Acute PE, now on anticoagulation - Pulmonary nodules and history of smoking.   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 There is subtle thoracic cord signal abnormality suggesting early cord edema. - Transferring to [facility] - Will need telemetry monitoring as he is at risk for spinal shock with autonomic instability, including bradycardia. Telemetry is also needed given his PE.  - Neurosurgery has been formally consulted - Will need cord decompression surgery - Eliquis has been switched to IV heparin in anticipation of surgery.   - Pain management with opiodis and muscle relaxers. Avoid benzodiazepines. Flexeril ineffective. Robaxin has been started by primary team.  - Agree with starting dexamethasone.  -  Bed rest with bed alarm to minimize the risk of falls and further spinal cord injury. Will need urinal/purewick and/or bedpan for all toileting needs. Patient and family educated and agree with the plan to maintain bedrest.   - Above findings communicated to Hospitalist night team.  ______________________________________________________________________  Patient seen by NP and then by MD.  Signed, Cortney E Everitt Clint Kill, NP [facility]   I have seen and examined the patient. I have formulated the assessment and recommendations. 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 Exam reveals BLE weakness, lower extremity sensory deficit and patellar hyperreflexia. Recommendations as above.  Electronically signed: Dr. Bradyn Soward

## 2024-05-01 NOTE — Progress Notes (Addendum)
 Progress Note   Patient: Joseph [name] FMW:[id number] DOB: [birth year] DOA: 04/27/2024     0 DOS: the patient was seen and examined on 05/01/2024   Brief hospital course: 43 year old man PMH including drug use (snorting fentanyl ) also documentation in the medical record of IV drug use although he denies, who presented with back pain for about 4 days and difficulty walking.  Workup revealed incidental PE finding and lung nodules.  Imaging of the lumbar spine was unrevealing and he was admitted for treatment of PE and further evaluation of back pain.  Discussed informally with neurosurgery who reviewed films, no intervention recommended.  Subsequently did poorly with therapy, neurology consultation placed.  Pursuing further evaluation today.  Consultants None but did discuss with neurosurgery informally   Procedures/Events 11/6 admission for low back pain and difficulty walking 11/7 discussed with Dr. Jackson neurosurgery, no intervention recommended 11/8 neurology consulted for persistent back pain and ambulatory dysfunction  Assessment and Plan: Right upper lobe segmental pulmonary embolism  Suspected pulmonary artery hypertension CT no findings of right heart strain.  2D echocardiogram with normal LVEF and RV systolic function. Hold oral anticoagulation pending further MRI studies.  Initiate heparin if no contraindication later this evening or tomorrow.  At this point given ambulatory dysfunction and further evaluation I think risk of anticoagulation outweighs benefit until further imaging obtained. Patient remains asymptomatic.   Low back pain Ataxic gait Ambulatory dysfunction CT lumbar spine with large Schmorl's node L5, no discitis, no fracture MRI lumbar spine no discitis.  Disc protrusion L4-5 with moderate left and mild right foraminal stenosis.  Right paramedian disc protrusion L5-S1 with mild bilateral foraminal narrowing and displacement of right S1 nerve roots. Discussed with  neurosurgery Dr. Darnella 11/7.  Recommended outpatient follow-up, no specific further evaluation, no indication for surgery.  Findings on imaging not likely to explain low back pain in his opinion. PT evaluation subsequently noted noted: Ataxic, poor bilateral lower extremity fluid control, coordination and stability.  MRI brain unremarkable.  Neurology consultation placed.  Plan cervical and thoracic MRI.  Fentanyl  abuse Would like to avoid narcotics but has significant pain and options are limited.  Pending further evaluation per neurology will hold Toradol .  Scheduled Tylenol .  Oxycodone  for severe pain.  Hopefully Robaxin will prevent need for narcotic use.   Bilateral solid pulmonary nodules  Measuring up to 6 mm. Metastatic disease not excluded but no primary identified.   Discussed with pulmonology, recommendation for outpatient follow-up and imaging.  No further inpatient evaluation suggested.   Large left thyroid lobe with rightward tracheal deviation TSH within normal limits. Thyroid ultrasound showed solitary large category 4 nodule occupying the majority of the left mid gland. Pursue fine-needle biopsy when able.    Subjective:  Feels like legs are stronger (in bed). Right shoulder is hurting a lot more.  Old football injury from high school but pain is much worse in the last couple of weeks and mobility and use is much decreased recently.  Physical Exam: Vitals:   04/30/24 0504 04/30/24 1337 04/30/24 2013 05/01/24 0528  BP: (!) 144/85 (!) 160/82 111/77 (!) 140/91  Pulse: 79 87 88 76  Resp: 18 20 15 17   Temp: 98.5 F (36.9 C) 98.2 F (36.8 C) 98.2 F (36.8 C) 98.9 F (37.2 C)  TempSrc: Oral Oral Oral Oral  SpO2: 99% 96% 95% 100%  Weight:      Height:       Physical Exam Vitals reviewed.  Constitutional:  General: He is not in acute distress.    Appearance: He is not ill-appearing or toxic-appearing.  Cardiovascular:     Rate and Rhythm: Normal rate and regular  rhythm.     Heart sounds: Murmur heard.  Pulmonary:     Effort: Pulmonary effort is normal. No respiratory distress.     Breath sounds: No wheezing, rhonchi or rales.  Musculoskeletal:     Comments: Improved strength bilateral lower extremities today in the bed, able to lift both legs off the bed after today, better flexion of both knees today unassisted.  Reports sensation grossly intact.  Right shoulder appears unremarkable on exam, he has some pain over the acromion process.  Range of movement is very limited.  Cannot touch the top of his head or extend arm straight out.  Neurological:     Mental Status: He is alert.  Psychiatric:        Mood and Affect: Mood normal.        Behavior: Behavior normal.    Data Reviewed: Hemoglobin stable at 12.0 TSH was within normal limits HIV nonreactive  Family Communication:   Disposition: Status is: Observation      Time spent: 35 minutes  Author: Toribio Door, MD 05/01/2024 12:31 PM  For on call review [email or web address].

## 2024-05-01 NOTE — Significant Event (Signed)
 X-ray right shoulder showed humeral lucency concerning for malignancy.  I took a quick look at the cervical and thoracic MRI which had not yet been read, there appeared to be an abnormality in the thoracic spine.  I called and spoke with Dr. Perley who reviewed the imaging, which revealed a pathologic fracture of T2 concerning for metastatic disease with severe spinal canal stenosis, cord compression and subtle thoracic cord abnormality suggesting early cord edema as well as other abnormalities of T3.  I then spoke with Dr. Malcolm neurosurgery, recommended starting Decadron and he will evaluate this evening.  Will initiate transfer to [facility] as the patient is likely to require decompressive surgery in the next few days.  Patient received last dose of apixaban this morning.  I updated patient with these concerning findings including of the spine, thyroid, lungs, right humerus and concern for malignancy.  He asked me to discuss all findings with his mother and girlfriend which I have done by telephone.  I have updated [facility] night coverage, as well as neurology Drs. Khaliqdina and Lindzen.  Toribio Door, MD [facility]

## 2024-05-01 NOTE — Progress Notes (Signed)
 Orthopedic Tech Progress Note Patient Details:  Joseph [name] 1980/11/23 [id number] RN will be applying arm sling once patient receives pain medication  Ortho Devices Type of Ortho Device: Shoulder immobilizer Ortho Device/Splint Location: RUE Ortho Device/Splint Interventions: Ordered      [name] 05/01/2024, 6:25 PM

## 2024-05-01 NOTE — Progress Notes (Signed)
 PHARMACY - ANTICOAGULATION CONSULT NOTE  Pharmacy Consult for Heparin IV Indication: pulmonary embolus  No Known Allergies  Patient Measurements: Height: 5' 9 (175.3 cm) Weight: (!) 143.5 kg (316 lb 5.8 oz) IBW/kg (Calculated) : 70.7 HEPARIN DW (KG): 104.9  Vital Signs: Temp: 98.2 F (36.8 C) (11/09 1319) Temp Source: Oral (11/09 1319) BP: 151/85 (11/09 1841) Pulse Rate: 68 (11/09 1841)  Labs: Recent Labs    04/29/24 0516 04/29/24 1119 04/30/24 0604 05/01/24 0621  HGB 12.4*  --  12.5* 12.0*  HCT 40.3  --  39.5 38.1*  PLT 148*  --  178 179  HEPARINUNFRC 0.52 0.21*  --   --   CREATININE 0.87  --   --   --     Estimated Creatinine Clearance: 154.5 mL/min (by C-G formula based on SCr of 0.87 mg/dL).   Medical History: Past Medical History:  Diagnosis Date   Anxiety    Asthma    as a child   Depression    Narcotic abuse (HCC)    Pulmonary nodules 04/29/2024    Medications:  Scheduled:   acetaminophen   1,000 mg Oral Q8H   dexamethasone (DECADRON) injection  4 mg Intravenous Q12H   methocarbamol  500 mg Oral TID   sodium chloride  flush  3 mL Intravenous Q12H   Infusions:   lactated ringers  125 mL/hr at 05/01/24 1343    Assessment: 85 yoM diagnosed with PE on 11/6.  He was initially on heparin IV (11/6-11/7) followed by apixaban (11/7-11/9).  Anticoagulation was held on 11/9 for MRI.  Neurosurgery consulted for metastatic disease with fracture at T2, but OK to continue with therapeutic heparin per notes.  Pharmacy is consulted to dose Heparin. Last apixaban dose taken on 11/9 at 6am.   Last heparin dosing was 2000 units/hr with subtherapeutic level of 0.21 CBC:  Hgb decreased to 12, Plt WNL  Goal of Therapy:  Heparin level 0.3-0.7 units/ml aPTT 66-102 seconds Monitor platelets by anticoagulation protocol: Yes   Plan:  No heparin bolus d/t recent DOAC Start heparin IV infusion at 2200 units/hr  aPTT and Heparin level in 6 hours after starting Daily  heparin level and CBC Follow up procedural plans   Wanda Hasting PharmD, BCPS [facility] main pharmacy [phone] 05/01/2024 7:45 PM

## 2024-05-01 NOTE — Consult Note (Incomplete)
 NEUROLOGY CONSULT NOTE   Date of service: May 01, 2024 Patient Name: Joseph [name] MRN:  [id number] DOB:  October 09, 1980 Chief Complaint: consulted for incoordination and ataxia Requesting Provider: Jadine Toribio SQUIBB, MD  History of Present Illness  Joseph [name] is a 43 y.o. male with hx of anxiety, depression, IV drug use, who presents with difficulty walking and back pain.  Work up with MRI Brain with no acute stroke, MR L spine with no cord compression but noted disc protrusion at L4-5 with foraminal stenosis.  ***  LKW: *** Modified rankin score: {Modified Rankin Scale:21264} IV Thrombolysis: ***Yes, *** No (reason) EVT: ***Yes, *** No (reason) ICH Score:***  NIHSS components Score: Comment  1a Level of Conscious 0[]  1[]  2[]  3[]      1b LOC Questions 0[]  1[]  2[]       1c LOC Commands 0[]  1[]  2[]       2 Best Gaze 0[]  1[]  2[]       3 Visual 0[]  1[]  2[]  3[]      4 Facial Palsy 0[]  1[]  2[]  3[]      5a Motor Arm - left 0[]  1[]  2[]  3[]  4[]  UN[]    5b Motor Arm - Right 0[]  1[]  2[]  3[]  4[]  UN[]    6a Motor Leg - Left 0[]  1[]  2[]  3[]  4[]  UN[]    6b Motor Leg - Right 0[]  1[]  2[]  3[]  4[]  UN[]    7 Limb Ataxia 0[]  1[]  2[]  UN[]      8 Sensory 0[]  1[]  2[]  UN[]      9 Best Language 0[]  1[]  2[]  3[]      10 Dysarthria 0[]  1[]  2[]  UN[]      11 Extinct. and Inattention 0[]  1[]  2[]       TOTAL:       ROS  ***Comprehensive ROS performed and pertinent positives documented in HPI  ***Unable to ascertain due to ***  Past History   Past Medical History:  Diagnosis Date   Anxiety    Asthma    as a child   Depression    Narcotic abuse (HCC)    Pulmonary nodules 04/29/2024    Past Surgical History:  Procedure Laterality Date   CLOSED REDUCTION FINGER WITH PERCUTANEOUS PINNING Right 06/22/2013   Procedure: RIGHT HAND SMALL FINGER CLOSED MANIPULATION AND PINNING;  Surgeon: Prentice LELON Pagan, MD;  Location: [facility] OR;  Service: Orthopedics;  Laterality: Right;   I & D EXTREMITY  03/24/2012    Procedure: IRRIGATION AND DEBRIDEMENT EXTREMITY;  Surgeon: Oneil JAYSON Herald, MD;  Location: [facility] OR;  Service: Orthopedics;  Laterality: Right;  Right Forearm    Family History: Family History  Problem Relation Age of Onset   Other Other    Alcohol abuse Other     Social History  reports that he has been smoking cigarettes. He has a 10 pack-year smoking history. He has never used smokeless tobacco. He reports that he does not currently use alcohol. He reports current drug use. Drug: Marijuana.  No Known Allergies  Medications   Current Facility-Administered Medications:    acetaminophen  (TYLENOL ) tablet 650 mg, 650 mg, Oral, Q6H PRN, Raford Lenis, MD, 650 mg at 04/30/24 1358   apixaban (ELIQUIS) tablet 10 mg, 10 mg, Oral, BID, 10 mg at 04/30/24 1831 **FOLLOWED BY** [START ON 05/06/2024] apixaban (ELIQUIS) tablet 5 mg, 5 mg, Oral, BID, Nicholaus Quarry, RPH   cyclobenzaprine (FLEXERIL) tablet 5 mg, 5 mg, Oral, TID PRN, Claiborne, Claudia, MD, 5 mg at 04/30/24 2031   ketorolac  (TORADOL ) 15 MG/ML injection 15 mg, 15  mg, Intravenous, Q6H, Claiborne, Claudia, MD, 15 mg at 04/30/24 2313   ondansetron  (ZOFRAN ) tablet 4 mg, 4 mg, Oral, Q6H PRN **OR** ondansetron  (ZOFRAN ) injection 4 mg, 4 mg, Intravenous, Q6H PRN, Claiborne, Claudia, MD   senna-docusate (Senokot-S) tablet 1 tablet, 1 tablet, Oral, QHS PRN, Claiborne, Claudia, MD   sodium chloride  flush (NS) 0.9 % injection 3 mL, 3 mL, Intravenous, Q12H, Arthea Child, MD, 3 mL at 04/30/24 2032  Vitals   Vitals:   May 11, 2024 2015 04/30/24 0504 04/30/24 1337 04/30/24 2013  BP: (!) 141/92 (!) 144/85 (!) 160/82 111/77  Pulse: 100 79 87 88  Resp: 16 18 20 15   Temp: 98.4 F (36.9 C) 98.5 F (36.9 C) 98.2 F (36.8 C) 98.2 F (36.8 C)  TempSrc: Oral Oral Oral Oral  SpO2: 97% 99% 96% 95%  Weight:      Height:        Body mass index is 46.72 kg/m.   Physical Exam   Constitutional: Appears well-developed and well-nourished. *** Psych: Affect  appropriate to situation. *** Eyes: No scleral injection. *** HENT: No OP obstruction. *** Head: Normocephalic. *** Cardiovascular: Normal rate and regular rhythm. *** Respiratory: Effort normal, non-labored breathing. *** GI: Soft.  No distension. There is no tenderness. *** Skin: WDI. ***  Neurologic Examination   ***  Labs/Imaging/Neurodiagnostic studies   CBC:  Recent Labs  Lab May 11, 2024 0516 04/30/24 0604  WBC 4.8 8.3  HGB 12.4* 12.5*  HCT 40.3 39.5  MCV 92.6 93.4  PLT 148* 178   Basic Metabolic Panel:  Lab Results  Component Value Date   NA 135 2024/05/11   K 4.3 05-11-24   CO2 28 May 11, 2024   GLUCOSE 137 (H) 05/11/24   BUN 11 05/11/2024   CREATININE 0.87 2024/05/11   CALCIUM 9.4 05-11-24   GFRNONAA >60 2024/05/11   GFRAA >60 08/16/2019   Lipid Panel: No results found for: LDLCALC HgbA1c: No results found for: HGBA1C Urine Drug Screen:     Component Value Date/Time   LABOPIA POSITIVE (A) 04/01/2018 1647   COCAINSCRNUR NONE DETECTED 04/01/2018 1647   LABBENZ NONE DETECTED 04/01/2018 1647   AMPHETMU POSITIVE (A) 04/01/2018 1647   THCU NONE DETECTED 04/01/2018 1647   LABBARB NONE DETECTED 04/01/2018 1647    Alcohol Level     Component Value Date/Time   ETH <10 04/01/2018 1319   INR No results found for: INR APTT No results found for: APTT AED levels: No results found for: PHENYTOIN, ZONISAMIDE, LAMOTRIGINE, LEVETIRACETA  CT Head without contrast(Personally reviewed): ***  CT angio Head and Neck with contrast(Personally reviewed): ***  MR Angio head without contrast and Carotid Duplex BL(Personally reviewed): ***  MRI Brain(Personally reviewed): ***  Neurodiagnostics rEEG:  ***  ASSESSMENT   Joseph [name] is a 43 y.o. male ***  RECOMMENDATIONS  *** ______________________________________________________________________    Signed, Deb Loudin, MD [facility]

## 2024-05-01 NOTE — Evaluation (Signed)
 Occupational Therapy Evaluation Patient Details Name: Joseph [name] MRN: [id number] DOB: 07/17/1980 Today's Date: 05/01/2024   History of Present Illness   43 yo male presents to therapy following hospitalization on 04/27/2024 due to SOB, BLE edema, rib and LBP. Pt was found to have R UL PE and pt started on heparin as well as nodules in lung and thyroid.  CT of lumbar spine also revealed Schmorl's nodule at inferior endplate of L5, MRI of lumbar spine with stenosis, disc protrusion and foraminal narrowing.  Pt PMH includes but is not limited to: anxiety, depression, and substance abuse.     Clinical Impressions PTA, pt lives with girlfriend and 2 roommates. Pt reports he is typically completely independent without need for AD and works delivering school supplies. Pt presents now with deficits in BLE strength, R shoulder pain/impaired ROM and impaired balance. Pt requires Mod A x 2 for bed mobility and variable levels of +2 assist pending transfer surface height. Pt with difficulty lifting BLE in all transfers. Pt requires Max A for UB ADL and up to Total A (+2 if in standing) for LB ADLs d/t deficits. As pt is significantly below baseline and does not have the physical assist needed to function upon DC, recommend intensive rehab services > 3 hours therapy per day.   If plan is discharge home, recommend the following:   A lot of help with walking and/or transfers;Two people to help with walking and/or transfers;A lot of help with bathing/dressing/bathroom;Two people to help with bathing/dressing/bathroom     Functional Status Assessment   Patient has had a recent decline in their functional status and demonstrates the ability to make significant improvements in function in a reasonable and predictable amount of time.     Equipment Recommendations   Other (comment) (TBD pending progress)     Recommendations for Other Services   Rehab consult     Precautions/Restrictions    Precautions Precautions: Fall;Other (comment) Precaution/Restrictions Comments: painful R shoulder Restrictions Weight Bearing Restrictions Per Provider Order: No     Mobility Bed Mobility Overal bed mobility: Needs Assistance Bed Mobility: Supine to Sit     Supine to sit: Mod assist, +2 for safety/equipment, +2 for physical assistance, HOB elevated, Used rails     General bed mobility comments: able to assist some to bring BLE towards EOB, assist to stabilize trunk and pt using L UE on side rail. cues for use of LUE on different rails, bed pad to scoot hips to EOB    Transfers Overall transfer level: Needs assistance Equipment used: Rolling walker (2 wheels), Ambulation equipment used Transfers: Sit to/from Stand, Bed to chair/wheelchair/BSC Sit to Stand: Mod assist, +2 physical assistance, +2 safety/equipment, Max assist, From elevated surface, Via lift equipment     Step pivot transfers: Min assist, +2 physical assistance, +2 safety/equipment     General transfer comment: From elevated bed, pt able to stand to RW with Mod A x 2. In standing, increased time and effort in weight shifting needed to step towards recliner with Min A x 2. Once in recliner, trialed Stedy from this lower surface, with up to Max A x 2 needed with use of LUE only to pull onto [place] bars. Maximove pad in place under pt in chair. Transfer via Lift Equipment: Stedy    Balance Overall balance assessment: History of Falls, Needs assistance Sitting-balance support: Feet supported Sitting balance-Leahy Scale: Fair     Standing balance support: Bilateral upper extremity supported, During functional activity, Reliant  on assistive device for balance Standing balance-Leahy Scale: Poor                             ADL either performed or assessed with clinical judgement   ADL Overall ADL's : Needs assistance/impaired Eating/Feeding: Set up;Sitting Eating/Feeding Details (indicate cue type and  reason): using L hand to self feed w/ some difficulty Grooming: Minimal assistance;Sitting   Upper Body Bathing: Sitting;Maximal assistance   Lower Body Bathing: Maximal assistance;Sitting/lateral leans;Sit to/from stand;+2 for safety/equipment;+2 for physical assistance   Upper Body Dressing : Maximal assistance;Sitting   Lower Body Dressing: Total assistance;+2 for physical assistance;+2 for safety/equipment;Sitting/lateral leans;Sit to/from stand Lower Body Dressing Details (indicate cue type and reason): assist to don underwear around feet, +2 to stand and then assist to pull up over bottom in standing     Toileting- Clothing Manipulation and Hygiene: Total assistance;+2 for safety/equipment;+2 for physical assistance;Sitting/lateral lean;Sit to/from stand         General ADL Comments: Limited by significant BLE weakness (R > L per pt) and R shoulder pain/limited ROM     Vision Ability to See in Adequate Light: 0 Adequate Patient Visual Report: No change from baseline Vision Assessment?: No apparent visual deficits     Perception         Praxis         Pertinent Vitals/Pain Pain Assessment Pain Assessment: Faces Faces Pain Scale: Hurts even more Pain Location: R shoulder Pain Descriptors / Indicators: Constant, Discomfort, Dull, Grimacing, Guarding Pain Intervention(s): Monitored during session, Limited activity within patient's tolerance, Repositioned     Extremity/Trunk Assessment Upper Extremity Assessment Upper Extremity Assessment: Right hand dominant;RUE deficits/detail RUE Deficits / Details: appears slightly more edematous than LUE. very limited tolerance to any shoulder movement RUE Coordination: decreased gross motor   Lower Extremity Assessment Lower Extremity Assessment: Defer to PT evaluation   Cervical / Trunk Assessment Cervical / Trunk Assessment: Normal   Communication Communication Communication: No apparent difficulties   Cognition  Arousal: Alert Behavior During Therapy: WFL for tasks assessed/performed Cognition: No apparent impairments                               Following commands: Intact       Cueing  General Comments   Cueing Techniques: Verbal cues      Exercises     Shoulder Instructions      Home Living Family/patient expects to be discharged to:: Other (Comment) (hotel) Living Arrangements: Spouse/significant other;Non-relatives/Friends (girlfriend (3 months pregnant) and 2 roommates)                               Additional Comments: pt currently in second story hotel with elevator      Prior Functioning/Environment Prior Level of Function : Independent/Modified Independent;History of Falls (last six months)             Mobility Comments: pt reports having to borrow a RW the day prior to hospital admission due to B LE instabiltiy, prior to that time pt was completely independent with mobility ADLs Comments: Indep with ADLs, IADLs. works delivering school supplies    OT Problem List: Decreased strength;Decreased range of motion;Decreased activity tolerance;Impaired balance (sitting and/or standing);Decreased knowledge of use of DME or AE;Pain;Impaired UE functional use   OT Treatment/Interventions: Self-care/ADL training;Therapeutic exercise;Energy conservation;DME and/or  AE instruction;Therapeutic activities;Patient/family education;Balance training      OT Goals(Current goals can be found in the care plan section)   Acute Rehab OT Goals Patient Stated Goal: pain control of R shoulder, figure out what is going on, get back to independence OT Goal Formulation: With patient Time For Goal Achievement: 05/15/24 Potential to Achieve Goals: Good   OT Frequency:  Min 2X/week    Co-evaluation              AM-PAC OT 6 Clicks Daily Activity     Outcome Measure Help from another person eating meals?: A Little Help from another person taking care of  personal grooming?: A Little Help from another person toileting, which includes using toliet, bedpan, or urinal?: Total Help from another person bathing (including washing, rinsing, drying)?: A Lot Help from another person to put on and taking off regular upper body clothing?: A Lot Help from another person to put on and taking off regular lower body clothing?: Total 6 Click Score: 12   End of Session Equipment Utilized During Treatment: Gait belt;Rolling walker (2 wheels) Nurse Communication: Mobility status;Need for lift equipment  Activity Tolerance: Patient tolerated treatment well;Patient limited by pain Patient left: in chair;with call bell/phone within reach  OT Visit Diagnosis: Unsteadiness on feet (R26.81);Other abnormalities of gait and mobility (R26.89);Muscle weakness (generalized) (M62.81);Pain Pain - Right/Left: Right Pain - part of body: Shoulder                Time: 1102-1130 OT Time Calculation (min): 28 min Charges:  OT General Charges $OT Visit: 1 Visit OT Evaluation $OT Eval Moderate Complexity: 1 Mod OT Treatments $Therapeutic Activity: 8-22 mins  Mliss NOVAK, OTR/L Acute Rehab Services Office: [phone]   Mliss Fish 05/01/2024, 12:05 PM

## 2024-05-02 ENCOUNTER — Inpatient Hospital Stay (HOSPITAL_COMMUNITY): Payer: MEDICAID

## 2024-05-02 ENCOUNTER — Other Ambulatory Visit: Payer: Self-pay

## 2024-05-02 DIAGNOSIS — D492 Neoplasm of unspecified behavior of bone, soft tissue, and skin: Secondary | ICD-10-CM | POA: Diagnosis not present

## 2024-05-02 DIAGNOSIS — S22029S Unspecified fracture of second thoracic vertebra, sequela: Secondary | ICD-10-CM | POA: Diagnosis not present

## 2024-05-02 DIAGNOSIS — M8448XK Pathological fracture, other site, subsequent encounter for fracture with nonunion: Secondary | ICD-10-CM | POA: Diagnosis not present

## 2024-05-02 DIAGNOSIS — G9520 Unspecified cord compression: Secondary | ICD-10-CM | POA: Diagnosis not present

## 2024-05-02 DIAGNOSIS — M545 Low back pain, unspecified: Secondary | ICD-10-CM | POA: Diagnosis not present

## 2024-05-02 DIAGNOSIS — Z5901 Sheltered homelessness: Secondary | ICD-10-CM | POA: Diagnosis not present

## 2024-05-02 DIAGNOSIS — C799 Secondary malignant neoplasm of unspecified site: Secondary | ICD-10-CM | POA: Diagnosis not present

## 2024-05-02 DIAGNOSIS — D62 Acute posthemorrhagic anemia: Secondary | ICD-10-CM | POA: Diagnosis not present

## 2024-05-02 DIAGNOSIS — E66813 Obesity, class 3: Secondary | ICD-10-CM | POA: Diagnosis present

## 2024-05-02 DIAGNOSIS — N319 Neuromuscular dysfunction of bladder, unspecified: Secondary | ICD-10-CM | POA: Diagnosis not present

## 2024-05-02 DIAGNOSIS — M8458XG Pathological fracture in neoplastic disease, other specified site, subsequent encounter for fracture with delayed healing: Secondary | ICD-10-CM | POA: Diagnosis not present

## 2024-05-02 DIAGNOSIS — Z789 Other specified health status: Secondary | ICD-10-CM | POA: Diagnosis not present

## 2024-05-02 DIAGNOSIS — F418 Other specified anxiety disorders: Secondary | ICD-10-CM | POA: Diagnosis not present

## 2024-05-02 DIAGNOSIS — F1910 Other psychoactive substance abuse, uncomplicated: Secondary | ICD-10-CM | POA: Diagnosis present

## 2024-05-02 DIAGNOSIS — G8222 Paraplegia, incomplete: Secondary | ICD-10-CM | POA: Diagnosis not present

## 2024-05-02 DIAGNOSIS — C7951 Secondary malignant neoplasm of bone: Secondary | ICD-10-CM | POA: Diagnosis present

## 2024-05-02 DIAGNOSIS — E049 Nontoxic goiter, unspecified: Secondary | ICD-10-CM | POA: Diagnosis not present

## 2024-05-02 DIAGNOSIS — S22009A Unspecified fracture of unspecified thoracic vertebra, initial encounter for closed fracture: Secondary | ICD-10-CM | POA: Diagnosis present

## 2024-05-02 DIAGNOSIS — I2693 Single subsegmental pulmonary embolism without acute cor pulmonale: Secondary | ICD-10-CM | POA: Diagnosis present

## 2024-05-02 DIAGNOSIS — Z6841 Body Mass Index (BMI) 40.0 and over, adult: Secondary | ICD-10-CM | POA: Diagnosis not present

## 2024-05-02 DIAGNOSIS — S22000A Wedge compression fracture of unspecified thoracic vertebra, initial encounter for closed fracture: Secondary | ICD-10-CM | POA: Diagnosis not present

## 2024-05-02 DIAGNOSIS — E041 Nontoxic single thyroid nodule: Secondary | ICD-10-CM | POA: Diagnosis not present

## 2024-05-02 DIAGNOSIS — K5901 Slow transit constipation: Secondary | ICD-10-CM | POA: Diagnosis not present

## 2024-05-02 DIAGNOSIS — C801 Malignant (primary) neoplasm, unspecified: Secondary | ICD-10-CM | POA: Diagnosis not present

## 2024-05-02 DIAGNOSIS — Z515 Encounter for palliative care: Secondary | ICD-10-CM | POA: Diagnosis not present

## 2024-05-02 DIAGNOSIS — I2699 Other pulmonary embolism without acute cor pulmonale: Secondary | ICD-10-CM | POA: Diagnosis not present

## 2024-05-02 DIAGNOSIS — I2721 Secondary pulmonary arterial hypertension: Secondary | ICD-10-CM | POA: Diagnosis present

## 2024-05-02 DIAGNOSIS — D649 Anemia, unspecified: Secondary | ICD-10-CM | POA: Diagnosis not present

## 2024-05-02 DIAGNOSIS — K592 Neurogenic bowel, not elsewhere classified: Secondary | ICD-10-CM | POA: Diagnosis not present

## 2024-05-02 DIAGNOSIS — R600 Localized edema: Secondary | ICD-10-CM | POA: Diagnosis not present

## 2024-05-02 DIAGNOSIS — S22029A Unspecified fracture of second thoracic vertebra, initial encounter for closed fracture: Secondary | ICD-10-CM | POA: Diagnosis not present

## 2024-05-02 DIAGNOSIS — C7802 Secondary malignant neoplasm of left lung: Secondary | ICD-10-CM | POA: Diagnosis present

## 2024-05-02 DIAGNOSIS — F17210 Nicotine dependence, cigarettes, uncomplicated: Secondary | ICD-10-CM | POA: Diagnosis present

## 2024-05-02 DIAGNOSIS — D696 Thrombocytopenia, unspecified: Secondary | ICD-10-CM | POA: Diagnosis not present

## 2024-05-02 DIAGNOSIS — M4714 Other spondylosis with myelopathy, thoracic region: Secondary | ICD-10-CM | POA: Diagnosis not present

## 2024-05-02 DIAGNOSIS — M4804 Spinal stenosis, thoracic region: Secondary | ICD-10-CM | POA: Diagnosis present

## 2024-05-02 DIAGNOSIS — E890 Postprocedural hypothyroidism: Secondary | ICD-10-CM | POA: Diagnosis present

## 2024-05-02 DIAGNOSIS — F32A Depression, unspecified: Secondary | ICD-10-CM | POA: Diagnosis present

## 2024-05-02 DIAGNOSIS — M7989 Other specified soft tissue disorders: Secondary | ICD-10-CM | POA: Diagnosis not present

## 2024-05-02 DIAGNOSIS — Z85850 Personal history of malignant neoplasm of thyroid: Secondary | ICD-10-CM | POA: Diagnosis not present

## 2024-05-02 DIAGNOSIS — M84521A Pathological fracture in neoplastic disease, right humerus, initial encounter for fracture: Secondary | ICD-10-CM | POA: Diagnosis not present

## 2024-05-02 DIAGNOSIS — C73 Malignant neoplasm of thyroid gland: Secondary | ICD-10-CM | POA: Diagnosis present

## 2024-05-02 DIAGNOSIS — Z7901 Long term (current) use of anticoagulants: Secondary | ICD-10-CM | POA: Diagnosis not present

## 2024-05-02 DIAGNOSIS — Z7189 Other specified counseling: Secondary | ICD-10-CM | POA: Diagnosis not present

## 2024-05-02 DIAGNOSIS — F4329 Adjustment disorder with other symptoms: Secondary | ICD-10-CM | POA: Diagnosis not present

## 2024-05-02 DIAGNOSIS — C7801 Secondary malignant neoplasm of right lung: Secondary | ICD-10-CM | POA: Diagnosis present

## 2024-05-02 DIAGNOSIS — S46001D Unspecified injury of muscle(s) and tendon(s) of the rotator cuff of right shoulder, subsequent encounter: Secondary | ICD-10-CM | POA: Diagnosis not present

## 2024-05-02 DIAGNOSIS — F1110 Opioid abuse, uncomplicated: Secondary | ICD-10-CM | POA: Diagnosis present

## 2024-05-02 DIAGNOSIS — M8458XA Pathological fracture in neoplastic disease, other specified site, initial encounter for fracture: Secondary | ICD-10-CM | POA: Diagnosis present

## 2024-05-02 DIAGNOSIS — J45909 Unspecified asthma, uncomplicated: Secondary | ICD-10-CM | POA: Diagnosis present

## 2024-05-02 DIAGNOSIS — M792 Neuralgia and neuritis, unspecified: Secondary | ICD-10-CM | POA: Diagnosis not present

## 2024-05-02 DIAGNOSIS — M5106 Intervertebral disc disorders with myelopathy, lumbar region: Secondary | ICD-10-CM | POA: Diagnosis present

## 2024-05-02 DIAGNOSIS — K5900 Constipation, unspecified: Secondary | ICD-10-CM | POA: Diagnosis not present

## 2024-05-02 LAB — CBC
HCT: 39.9 % (ref 39.0–52.0)
Hemoglobin: 13.1 g/dL (ref 13.0–17.0)
MCH: 29.2 pg (ref 26.0–34.0)
MCHC: 32.8 g/dL (ref 30.0–36.0)
MCV: 89.1 fL (ref 80.0–100.0)
Platelets: 111 K/uL — ABNORMAL LOW (ref 150–400)
RBC: 4.48 MIL/uL (ref 4.22–5.81)
RDW: 11.7 % (ref 11.5–15.5)
WBC: 6.8 K/uL (ref 4.0–10.5)
nRBC: 0 % (ref 0.0–0.2)

## 2024-05-02 LAB — GLUCOSE, CAPILLARY: Glucose-Capillary: 136 mg/dL — ABNORMAL HIGH (ref 70–99)

## 2024-05-02 LAB — APTT
aPTT: 62 s — ABNORMAL HIGH (ref 24–36)
aPTT: 115 s — ABNORMAL HIGH (ref 24–36)

## 2024-05-02 LAB — HEPARIN LEVEL (UNFRACTIONATED): Heparin Unfractionated: 0.82 [IU]/mL — ABNORMAL HIGH (ref 0.30–0.70)

## 2024-05-02 MED ORDER — SODIUM CHLORIDE 0.9% FLUSH
10.0000 mL | Freq: Two times a day (BID) | INTRAVENOUS | Status: DC
  Administered 2024-05-02 – 2024-05-09 (×12): 10 mL
  Administered 2024-05-09: 40 mL
  Administered 2024-05-10: 20 mL
  Administered 2024-05-10: 10 mL
  Administered 2024-05-11 – 2024-05-12 (×4): 20 mL
  Administered 2024-05-13: 30 mL
  Administered 2024-05-13: 10 mL
  Administered 2024-05-14: 30 mL
  Administered 2024-05-14: 10 mL
  Administered 2024-05-15: 20 mL
  Administered 2024-05-15: 40 mL
  Administered 2024-05-16: 20 mL

## 2024-05-02 MED ORDER — CHLORHEXIDINE GLUCONATE CLOTH 2 % EX PADS
6.0000 | MEDICATED_PAD | Freq: Every day | CUTANEOUS | Status: DC
Start: 2024-05-02 — End: 2024-05-16
  Administered 2024-05-02 – 2024-05-16 (×10): 6 via TOPICAL

## 2024-05-02 MED ORDER — SODIUM CHLORIDE 0.9% FLUSH: 10.0000 mL | INTRAVENOUS | Status: DC | PRN

## 2024-05-02 MED ORDER — IOHEXOL 350 MG/ML SOLN
150.0000 mL | Freq: Once | INTRAVENOUS | Status: AC | PRN
  Administered 2024-05-02: 150 mL via INTRAVENOUS

## 2024-05-02 MED ORDER — DEXTROSE IN LACTATED RINGERS 5 % IV SOLN: INTRAVENOUS | Status: DC

## 2024-05-02 NOTE — Progress Notes (Signed)
 PROGRESS NOTE    Joseph [name]  FMW:[id number] DOB: 1980/12/11 DOA: 04/27/2024 PCP: Patient, No Pcp Per    Brief Narrative:  43 year old gentleman with no significant medical history presented to the hospital with about 4 days of back pain and difficulty walking, he was having some back pain issues for about 2 weeks.  In the emergency room he had CT scan of the lumbar spine that was unrevealing.  He was found to have right upper lobe segmental PE.  He was subsequently started on Eliquis. Patient continued to have difficulty with mobility.  Multiple investigations were done.  MRI of the thoracic spine ultimately showed pathological T2 compression fracture. Thyroid ultrasound with large left thyroid nodule. Transferred to [facility] from [place] for neurosurgery, decompression and biopsy. Remains on heparin.  Subjective: Patient seen and examined.  Mother was at the bedside.  Patient tells me he gets these episodic back pain and sometimes intractable.  Denies any numbness or weakness of the legs.  Surgery has recommended nonweightbearing and bedrest.  CT scan abdomen pelvis with contrast pending. Discussed with neurosurgery who recommended biopsy of the thyroid gland, discussing with IR. Will order PICC line as patient is anticipated to stay for longer here and also on heparin.   Assessment & Plan:   Right upper lobe segmental PE, pulmonary arterial hypertension: New diagnosis.  Underlying metastatic disease found.  Was on Eliquis.  Now on heparin.  Will keep on heparin perioperative.  Pharmacy to manage.  Metastatic malignancy: Patient was found to have left thyroid enlargement more than 4 cm Right humeral bone lucency MRI of the thoracic spine with T2 pathological fracture, severe spinal canal stenosis and cord compression. Also with pulmonary nodules.  Adequate pain management.  Bowel regimen.  Bedrest as per neurosurgery. Soft tissue biopsy, ordering through IR  for thyroid biopsy. Neurosurgery for decompression, biopsy on 11/13. On IV steroids to continue.    DVT prophylaxis: Place and maintain sequential compression device Start: 05/01/24 1226 Heparin infusion  Code Status: Full code Family Communication: Mother at the bedside Disposition Plan: Status is: Inpatient Remains inpatient appropriate because: On heparin infusion     Consultants:  Neurosurgery   Procedures:  None  Antimicrobials:  None     Objective: Vitals:   05/02/24 0050 05/02/24 0116 05/02/24 0255 05/02/24 0849  BP:  (!) 147/85 (!) 149/91 126/75  Pulse:  74 78 89  Resp:   12 15  Temp: 99 F (37.2 C)  98.3 F (36.8 C) 98.4 F (36.9 C)  TempSrc: Oral  Oral Oral  SpO2:   93% 95%  Weight:      Height:        Intake/Output Summary (Last 24 hours) at 05/02/2024 1201 Last data filed at 05/02/2024 0853 Gross per 24 hour  Intake 1618.48 ml  Output 2350 ml  Net -731.52 ml   Filed Weights   04/27/24 2357 04/28/24 0100  Weight: (!) 143.5 kg (!) 143.5 kg    Examination:  General exam: Appears calm and comfortable.  In mild distress.  Anxious. Respiratory system: Clear to auscultation. Respiratory effort normal.  No added sounds. Cardiovascular system: S1 & S2 heard, RRR. No JVD, murmurs, rubs, gallops or clicks. No pedal edema. Gastrointestinal system: Abdomen is nondistended, soft and nontender. No organomegaly or masses felt. Normal bowel sounds heard. Central nervous system: Alert and oriented. No focal neurological deficits. Extremities: Symmetric 5 x 5 power. Patient has some stiffness of the right shoulder.  ROM not  elicited .    Data Reviewed: I have personally reviewed following labs and imaging studies  CBC: Recent Labs  Lab 04/27/24 2339 04/29/24 0516 04/30/24 0604 05/01/24 0621 05/02/24 0556  WBC 6.1 4.8 8.3 5.3 6.8  HGB 12.3* 12.4* 12.5* 12.0* 13.1  HCT 38.1* 40.3 39.5 38.1* 39.9  MCV 91.6 92.6 93.4 94.5 89.1  PLT 155 148* 178  179 111*   Basic Metabolic Panel: Recent Labs  Lab 04/27/24 2339 04/29/24 0516  NA 137 135  K 4.5 4.3  CL 99 101  CO2 29 28  GLUCOSE 107* 137*  BUN 8 11  CREATININE 0.94 0.87  CALCIUM 9.5 9.4   GFR: Estimated Creatinine Clearance: 154.5 mL/min (by C-G formula based on SCr of 0.87 mg/dL). Liver Function Tests: Recent Labs  Lab 04/28/24 0000  AST 28  ALT 43  ALKPHOS 98  BILITOT 0.4  PROT 7.9  ALBUMIN 3.7   No results for input(s): LIPASE, AMYLASE in the last 168 hours. No results for input(s): AMMONIA in the last 168 hours. Coagulation Profile: No results for input(s): INR, PROTIME in the last 168 hours. Cardiac Enzymes: No results for input(s): CKTOTAL, CKMB, CKMBINDEX, TROPONINI in the last 168 hours. BNP (last 3 results) No results for input(s): PROBNP in the last 8760 hours. HbA1C: No results for input(s): HGBA1C in the last 72 hours. CBG: Recent Labs  Lab 05/02/24 0159  GLUCAP 136*   Lipid Profile: No results for input(s): CHOL, HDL, LDLCALC, TRIG, CHOLHDL, LDLDIRECT in the last 72 hours. Thyroid Function Tests: Recent Labs    04/30/24 0604  TSH 3.700   Anemia Panel: No results for input(s): VITAMINB12, FOLATE, FERRITIN, TIBC, IRON, RETICCTPCT in the last 72 hours. Sepsis Labs: No results for input(s): PROCALCITON, LATICACIDVEN in the last 168 hours.  No results found for this or any previous visit (from the past 240 hours).       Radiology Studies: MRI and CT scans reviewed       Scheduled Meds:  acetaminophen   1,000 mg Oral Q8H   Chlorhexidine  Gluconate Cloth  6 each Topical Daily   dexamethasone (DECADRON) injection  4 mg Intravenous Q12H   fentaNYL   1 patch Transdermal Q72H   methocarbamol  500 mg Oral TID   sodium chloride  flush  10-40 mL Intracatheter Q12H   sodium chloride  flush  3 mL Intravenous Q12H   Continuous Infusions:  heparin 2,400 Units/hr (05/02/24 1007)   lactated  ringers  125 mL/hr at 05/02/24 0439     LOS: 0 days    Time spent: 51 minutes    Renato Applebaum, MD [facility]

## 2024-05-02 NOTE — Progress Notes (Signed)
 Received report from Victoria, [facility]; pt just arrived on floor via EMS.

## 2024-05-02 NOTE — Progress Notes (Signed)
 PT Cancellation Note  Patient Details Name: Joseph [name] MRN: [id number] DOB: [birth year]   Cancelled Treatment:    Reason Eval/Treat Not Completed: Medical issues which prohibited therapy   Noted developments--pt with T2 fracture with cord compression awaiting decompression surgery. Neurology note states bedrest and NWB bil LEs and RUE. Will sign-off at this time. Please reorder after surgery as appropriate.    Macario GORMAN, PT Acute Rehabilitation Services  Office [phone]   [name] 05/02/2024, 10:13 AM

## 2024-05-02 NOTE — Progress Notes (Addendum)
**Note Joseph-Identified via Obfuscation**        Overnight   NAME: Joseph [name] MRN: [id number] DOB : [birth year]    Date of Service   05/02/2024   HPI/Events of Note    Update: call made to obtain bed status and possibly facilitate transfer.   Interventions/ Plan   Bed obtained on 4N PCU 0132 hrs      Update 0150 hrs Bed Obtained - 4NP13C Transfer pending  Update 0220 Hours Patient in care of [facility] to [facility]  Joseph [name] BSN MSNA MSN ACNPC-AG Acute Care Nurse Practitioner [facility] [place]

## 2024-05-02 NOTE — Plan of Care (Signed)

## 2024-05-02 NOTE — Progress Notes (Signed)
 OT Cancellation Note  Patient Details Name: Joseph [name] MRN: [id number] DOB: 06/15/1981   Cancelled Treatment:    Reason Eval/Treat Not Completed: Patient not medically ready  Noted developments--pt with T2 fracture with cord compression awaiting decompression surgery. Neurology note states bedrest and NWB bil LEs and RUE. Will sign-off at this time. Please reorder after surgery as appropriate.   Nisaiah Bechtol D Walton, OTD, OTR/L [facility] Acute Rehabilitation Office: [phone]   Joseph [name] 05/02/2024, 3:52 PM

## 2024-05-02 NOTE — Progress Notes (Signed)
 This nurse received a call from [facility] radiology stating that the pt PICC in LUE was malpositioned. Infusion of LR was stopped. Charge nurse was notified and consult for IV team was placed. IV team stated that the PICC line can still be used for blood draws and the PICC team will be here tomorrow to assess.

## 2024-05-02 NOTE — Progress Notes (Signed)
 NEUROLOGY CONSULT FOLLOW UP NOTE   Date of service: May 02, 2024 Patient Name: Joseph [name] MRN:  [id number] DOB:  1980/09/01  Interval Hx/subjective   No family at the bedside. Patient is resting with eyes closed in NAD. He awakens easily. C/o pain and would like some pain meds. Informs me that surgery will happen probably on Thursday. No new neurological events overnight   Vitals   Vitals:   05/02/24 0050 05/02/24 0116 05/02/24 0255 05/02/24 0849  BP:  (!) 147/85 (!) 149/91 126/75  Pulse:  74 78 89  Resp:   12 15  Temp: 99 F (37.2 C)  98.3 F (36.8 C) 98.4 F (36.9 C)  TempSrc: Oral  Oral Oral  SpO2:   93% 95%  Weight:      Height:         Body mass index is 46.72 kg/m.  Physical Exam   Constitutional: Appears well-developed and well-nourished.   Psych: Affect appropriate to situation.   Eyes: No scleral injection.   HENT: No OP obstrucion.   Head: Normocephalic.   Cardiovascular: Normal rate and regular rhythm.   Respiratory: Effort normal, non-labored breathing.   GI: Soft.  No distension. There is no tenderness.   Skin: WDI.    Neurologic Examination   Mental Status -  Level of arousal and orientation to time, place, and person were intact. Language including expression, naming, repetition, comprehension was assessed and found intact. Attention span and concentration were normal. Recent and remote memory were intact. Fund of Knowledge was assessed and was intact.  Cranial Nerves II - XII - II - Visual field intact OU. III, IV, VI - Extraocular movements intact. V - Facial sensation intact bilaterally. VII - Facial movement intact bilaterally. VIII - Hearing & vestibular intact bilaterally. X - Palate elevates symmetrically. XI - Chin turning & shoulder shrug intact bilaterally. XII - Tongue protrusion intact.  Motor Strength - The patient's strength was 4+/5 strength to bilateral upper extremities with limited abduction of right shoulder due  to old injury, 4-/5 strength to bilateral lower extremities, proximal and distal Sensory - decreased in right leg below knee  Coordination - The patient had normal movements in the hands and feet with no ataxia or dysmetria.  Tremor was absent . Gait and Station - deferred.  Medications  Current Facility-Administered Medications:    acetaminophen  (TYLENOL ) tablet 1,000 mg, 1,000 mg, Oral, Q8H, Jadine Toribio SQUIBB, MD, 1,000 mg at 05/02/24 0517   cyclobenzaprine (FLEXERIL) tablet 5 mg, 5 mg, Oral, TID PRN, Jadine Toribio SQUIBB, MD, 5 mg at 05/01/24 2217   dexamethasone (DECADRON) injection 4 mg, 4 mg, Intravenous, Q12H, Darnella Dorn SAUNDERS, MD, 4 mg at 05/02/24 9160   fentaNYL  (DURAGESIC ) 12 MCG/HR 1 patch, 1 patch, Transdermal, Q72H, Daniels, James K, NP, 1 patch at 05/01/24 2342   heparin ADULT infusion 100 units/mL (25000 units/250mL), 2,400 Units/hr, Intravenous, Continuous, Mattie Marvetta SQUIBB, RPH, Last Rate: 24 mL/hr at 05/02/24 0727, 2,400 Units/hr at 05/02/24 9272   HYDROmorphone  (DILAUDID ) injection 0.5-1 mg, 0.5-1 mg, Intravenous, Q2H PRN, Daniels, James K, NP, 1 mg at 05/02/24 0510   lactated ringers  infusion, , Intravenous, Continuous, Jadine Toribio SQUIBB, MD, Last Rate: 125 mL/hr at 05/02/24 0439, New Bag at 05/02/24 0439   methocarbamol (ROBAXIN) tablet 500 mg, 500 mg, Oral, TID, Jadine Toribio SQUIBB, MD, 500 mg at 05/02/24 9161   ondansetron  (ZOFRAN ) tablet 4 mg, 4 mg, Oral, Q6H PRN **OR** ondansetron  (ZOFRAN ) injection 4 mg, 4 mg,  Intravenous, Q6H PRN, Jadine Toribio SQUIBB, MD   oxyCODONE  (Oxy IR/ROXICODONE ) immediate release tablet 10-15 mg, 10-15 mg, Oral, Q6H PRN, Jadine Toribio SQUIBB, MD, 15 mg at 05/02/24 9161   senna-docusate (Senokot-S) tablet 1 tablet, 1 tablet, Oral, QHS PRN, Jadine Toribio SQUIBB, MD   sodium chloride  flush (NS) 0.9 % injection 3 mL, 3 mL, Intravenous, Q12H, Jadine Toribio SQUIBB, MD, 3 mL at 05/02/24 0839  Labs and Diagnostic Imaging   CBC:  Recent Labs  Lab  05/01/24 0621 05/02/24 0556  WBC 5.3 6.8  HGB 12.0* 13.1  HCT 38.1* 39.9  MCV 94.5 89.1  PLT 179 111*    Basic Metabolic Panel:  Lab Results  Component Value Date   NA 135 04/29/2024   K 4.3 04/29/2024   CO2 28 04/29/2024   GLUCOSE 137 (H) 04/29/2024   BUN 11 04/29/2024   CREATININE 0.87 04/29/2024   CALCIUM 9.4 04/29/2024   GFRNONAA >60 04/29/2024   GFRAA >60 08/16/2019   Lipid Panel: No results found for: LDLCALC HgbA1c: No results found for: HGBA1C Urine Drug Screen:     Component Value Date/Time   LABOPIA POSITIVE (A) 04/01/2018 1647   COCAINSCRNUR NONE DETECTED 04/01/2018 1647   LABBENZ NONE DETECTED 04/01/2018 1647   AMPHETMU POSITIVE (A) 04/01/2018 1647   THCU NONE DETECTED 04/01/2018 1647   LABBARB NONE DETECTED 04/01/2018 1647    Alcohol Level     Component Value Date/Time   ETH <10 04/01/2018 1319   INR No results found for: INR APTT  Lab Results  Component Value Date   APTT 62 (H) 05/02/2024   AED levels: No results found for: PHENYTOIN, ZONISAMIDE, LAMOTRIGINE, LEVETIRACETA  MRI Brain (Personally reviewed): No acute abnormality   MRI lumbar spine: No evidence of discitis, broad-based disc protrusion at L4-L5 with moderate left and mild right foraminal stenosis, right paramedian disc protrusion at L5-S1 with mild bilateral foraminal narrowing and displacement of right S1 nerve roots   MRI cervical: 1. No suspicious osseous lesion in the cervical spine. 2. 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 Subtle thoracic cord signal abnormality suggesting early cord edema. 3. Small disc bulge at C5-C6, slightly left eccentric, indenting the ventral thecal sac with subtle ventral cord flattening and no cord signal abnormality. 4. Additional degenerative changes as above.    thoracic spine 1. Enhancing lesion within the T2 vertebral body with pathologic fracture resulting in severe vertebral body height loss, concerning  for metastatic disease. 2. Significant bowing of the T2 posterior cortex causing severe spinal canal stenosis and cord compression. No definite cord edema. 3. Abnormal enhancement extending into the ventral epidural space at T1T2 and into the left T1T2 and T2T3 neural foramina abutting the exiting T1 and T2 nerve roots, concerning for extraosseous involvement of disease. 4. Edema and mild enhancement in the posterior T3 vertebral body, pedicles, and pars interarticularis, possibly representing additional osseous lesion versus reactive changes in the setting of fracture. 5. Enlarged left thyroid lobe with a heterogeneous nodule measuring at least 4.5 cm. Recommend management per thyroid ultrasound report.  CTA of chest: 1. Right upper lobe segmental pulmonary embolism. Main pulmonary artery is enlarged compatible with pulmonary arterial hypertension. No evidence for right heart strain. 2. Numerous scattered bilateral solid pulmonary nodules measuring up to 6 mm in the right lower lobe. Metastatic disease not excluded given appearance and distribution. 3. Enlarged and heterogeneous left thyroid lobe measuring up to 5 cm with rightward tracheal deviation. Recommend non-emergent thyroid ultrasound. 4.  Small hiatal hernia and gallstones.  Assessment   ZAELYN BARBARY is a 43 y.o. male  hx of daily fentanyl  use, smoking, morbid obesity, depression and chronic right shoulder pain due to remote football injury who presents with cough, lower back pain, unsteady gait, numbness and tingling in the inner thighs going down the legs with difficulty moving lower extremities.  Patient reports that he has had his back pain for about 2 to 3 weeks, that it is in the lumbar area of the spine and is accompanied by tingling and numbness in the inner thighs radiating down the backs of his legs.  He had no inciting injury.  He also reports difficulty walking for 5 to 6 days with an unsteady gait and also weakness  of bilateral lower extremities with no bowel or bladder incontinence.  He reports that the strength in his lower extremities is somewhat improved, but still not back to baseline.    Recommendations   - PE treatment per primary  - PT/OT as able  - Neurology will sign off Please call with questions or concerns  ______________________________________________________________________   Signed, Karna DELENA Geralds, NP [facility]

## 2024-05-02 NOTE — Progress Notes (Addendum)
 PHARMACY - ANTICOAGULATION CONSULT NOTE  Pharmacy Consult for heparin Indication: pulmonary embolus  Labs: Recent Labs    04/29/24 1119 04/30/24 0604 04/30/24 0604 05/01/24 0621 05/02/24 0556  HGB  --  12.5*   < > 12.0* 13.1  HCT  --  39.5  --  38.1* 39.9  PLT  --  178  --  179 111*  APTT  --   --   --   --  62*  HEPARINUNFRC 0.21*  --   --   --  0.82*   < > = values in this interval not displayed.  Assessment: 43yo male subtherapeutic on heparin after resumed while DOAC on hold; Plt 179>111 noted, no bleeding documented.  Goal of Therapy:  aPTT 66-102 seconds   Plan:  Increase heparin infusion by 10% to 2400 units/hr. Check PTT in 6 hours.   Marvetta Dauphin, PharmD, BCPS 05/02/2024 6:49 AM

## 2024-05-02 NOTE — Progress Notes (Signed)
Pt back on unit from CT

## 2024-05-02 NOTE — Consult Note (Signed)
 Neurosurgery Consult Note  Assessment:  43 y/o M w/ hx morbid obesity, substance abuse who presents with pathologic T2 fracture, high grade ESCC, progressive thoracic myelopathy, unknown metastatic primary   Plan:  CTCAP CT C and Tspine Anticipate surgical intervention sometime in the next week Please consult IR for consideration of extraspinal biopsy to help with surgical planning Decadron 4q12 Ok for therapeutic heparin    CC: back pain  HPI:     Patient is a 43 y.o. male w/ hx substance abuse, morbid obesity BMI 46 who presents with 1.5 weeks of BLE weakness and numbness. Progressively worsening with time. He has not been able to walk for 5-6 days. Also with severe upper thoracic pain. Imaging studies revealed a pathologic T2 fracture high grade ESCC. Also with pulm nodules, thyroid nodule, and PE requiring anticoagulation. He is not on any oxygen. 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/03/2014   Cocaine use disorder, severe, dependence (HCC) 12/03/2014   Cannabis use disorder, severe, dependence (HCC) 12/03/2014   Abscess of right forearm 03/24/2012   Past Medical History:  Diagnosis Date   Anxiety    Asthma    as a child   Depression    Narcotic abuse (HCC)    Pulmonary nodules 04/29/2024    Past Surgical History:   Procedure Laterality Date   CLOSED REDUCTION FINGER WITH PERCUTANEOUS PINNING Right 06/22/2013   Procedure: RIGHT HAND SMALL FINGER CLOSED MANIPULATION AND PINNING;  Surgeon: Prentice LELON Pagan, MD;  Location: [facility] OR;  Service: Orthopedics;  Laterality: Right;   I & D EXTREMITY  03/24/2012   Procedure: IRRIGATION AND DEBRIDEMENT EXTREMITY;  Surgeon: Oneil JAYSON Herald, MD;  Location: [facility] OR;  Service: Orthopedics;  Laterality: Right;  Right Forearm    No medications prior to admission.   No Known Allergies  Social History   Tobacco Use   Smoking status: Every Day    Current packs/day: 1.00    Average packs/day: 1 pack/day for 10.0 years (10.0 ttl pk-yrs)    Types: Cigarettes   Smokeless tobacco: Never  Substance Use Topics   Alcohol use: Not Currently    Family History  Problem Relation Age of Onset   Other Other    Alcohol abuse Other      Review of Systems Pertinent items are noted in HPI.  Objective:   Patient Vitals for the past 8 hrs:  BP Temp Temp src Pulse Resp SpO2  05/02/24 0255 (!) 149/91 98.3 F (36.8 C) Oral 78 12 93 %  05/02/24 0116 (!) 147/85 -- -- 74 -- --  05/02/24 0050 -- 99 F (37.2 C) Oral -- -- --   I/O last 3 completed shifts: In: 2341.5 [P.O.:960; I.V.:1381.5] Out: 2300 [Urine:2300] No intake/output data recorded.    Exam: RUE: 5/5 deltoid, 5/5 bicep, 5/5 tricep, 5/5 grip LUE: 5/5 deltoid, 5/5  bicep, 5/5 tricep, 5/5 grip BLE: 4/5 IP, quad, ham, DF, EHL, PF Subjective sensory level from mid thoracic down bilaterally     Data ReviewCBC:  Lab Results  Component Value Date   WBC 6.8 05/02/2024   RBC 4.48 05/02/2024   BMP:  Lab Results  Component Value Date   GLUCOSE 137 (H) 04/29/2024   CO2 28 04/29/2024   BUN 11 04/29/2024   CREATININE 0.87 04/29/2024   CALCIUM 9.4 04/29/2024

## 2024-05-02 NOTE — Progress Notes (Signed)
 PHARMACY - ANTICOAGULATION CONSULT NOTE  Pharmacy Consult for heparin Indication: pulmonary embolus  Patient Measurements: Height: 5' 9 (175.3 cm) Weight: (!) 143.5 kg (316 lb 5.8 oz) IBW/kg (Calculated) : 70.7 HEPARIN DW (KG): 104.9  Labs: Recent Labs    04/30/24 0604 05/01/24 0621 05/02/24 0556 05/02/24 1300  HGB 12.5* 12.0* 13.1  --   HCT 39.5 38.1* 39.9  --   PLT 178 179 111*  --   APTT  --   --  62* 115*  HEPARINUNFRC  --   --  0.82*  --   Assessment: 43 yoM diagnosed with PE on 11/6.  He was initially on heparin IV (11/6-11/7) followed by apixaban (11/7-11/9).  Anticoagulation was held on 11/9 for MRI.  Neurosurgery consulted for metastatic disease with fracture at T2, but OK to continue with therapeutic heparin per notes.  Pharmacy is consulted to dose Heparin. Last apixaban dose taken on 11/9 at 6am.    11/10 PM: aPTT supratherapeutic at 115. Platelets 179>111. No bleeding or infusion issues noted.  Goal of Therapy:  aPTT 66-102 seconds   Plan:  Decrease heparin infusion to 2200 units/hr. Check aPTT in 8 hours.   Larraine Brazier, PharmD Clinical Pharmacist 05/02/2024  4:54 PM **Pharmacist phone directory can now be found on [email or web address] ([email or web address]).  Listed under [facility].

## 2024-05-02 NOTE — Progress Notes (Signed)
 Peripherally Inserted Central Catheter Placement  The IV Nurse has discussed with the patient and/or persons authorized to consent for the patient, the purpose of this procedure and the potential benefits and risks involved with this procedure.  The benefits include less needle sticks, lab draws from the catheter, and the patient may be discharged home with the catheter. Risks include, but not limited to, infection, bleeding, blood clot (thrombus formation), and puncture of an artery; nerve damage and irregular heartbeat and possibility to perform a PICC exchange if needed/ordered by physician.  Alternatives to this procedure were also discussed.  Bard Power PICC patient education guide, fact sheet on infection prevention and patient information card has been provided to patient /or left at bedside.    PICC Placement Documentation  PICC Double Lumen 05/02/24 Left Brachial 47 cm 1 cm (Active)  Indication for Insertion or Continuance of Line Prolonged intravenous therapies 05/02/24 1100  Exposed Catheter (cm) 1 cm 05/02/24 1100  Site Assessment Clean, Dry, Intact 05/02/24 1100  Lumen #1 Status Flushed;Saline locked;Blood return noted 05/02/24 1100  Lumen #2 Status Flushed;Saline locked;Blood return noted 05/02/24 1100  Dressing Type Transparent;Securing device 05/02/24 1100  Dressing Status Antimicrobial disc/dressing in place;Clean, Dry, Intact 05/02/24 1100  Line Care Connections checked and tightened 05/02/24 1100  Line Adjustment (NICU/IV Team Only) No 05/02/24 1100  Dressing Intervention New dressing;Adhesive placed at insertion site (IV team only) 05/02/24 1100  Dressing Change Due 05/09/24 05/02/24 1100       Ethyl Priestly Renee 05/02/2024, 11:45 AM

## 2024-05-02 NOTE — Progress Notes (Signed)
PT off unit to CT.

## 2024-05-03 DIAGNOSIS — S22000A Wedge compression fracture of unspecified thoracic vertebra, initial encounter for closed fracture: Secondary | ICD-10-CM | POA: Diagnosis not present

## 2024-05-03 DIAGNOSIS — E041 Nontoxic single thyroid nodule: Secondary | ICD-10-CM | POA: Diagnosis not present

## 2024-05-03 DIAGNOSIS — I2699 Other pulmonary embolism without acute cor pulmonale: Secondary | ICD-10-CM | POA: Diagnosis not present

## 2024-05-03 LAB — CBC
HCT: 35.7 % — ABNORMAL LOW (ref 39.0–52.0)
Hemoglobin: 11.7 g/dL — ABNORMAL LOW (ref 13.0–17.0)
MCH: 29.4 pg (ref 26.0–34.0)
MCHC: 32.8 g/dL (ref 30.0–36.0)
MCV: 89.7 fL (ref 80.0–100.0)
Platelets: 90 K/uL — ABNORMAL LOW (ref 150–400)
RBC: 3.98 MIL/uL — ABNORMAL LOW (ref 4.22–5.81)
RDW: 11.8 % (ref 11.5–15.5)
WBC: 10.9 K/uL — ABNORMAL HIGH (ref 4.0–10.5)
nRBC: 0 % (ref 0.0–0.2)

## 2024-05-03 LAB — APTT
aPTT: 102 s — ABNORMAL HIGH (ref 24–36)
aPTT: 98 s — ABNORMAL HIGH (ref 24–36)

## 2024-05-03 LAB — HEPARIN LEVEL (UNFRACTIONATED): Heparin Unfractionated: 0.86 [IU]/mL — ABNORMAL HIGH (ref 0.30–0.70)

## 2024-05-03 MED ORDER — CYCLOBENZAPRINE HCL 10 MG PO TABS
10.0000 mg | ORAL_TABLET | Freq: Three times a day (TID) | ORAL | Status: DC | PRN
  Administered 2024-05-03 – 2024-05-04 (×2): 10 mg via ORAL
  Filled 2024-05-03 (×3): qty 1

## 2024-05-03 NOTE — Progress Notes (Signed)
 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  LOS: 1 day   Plan: Appreciate IR consideration of thyroid biopsy Surgical intervention planned for Thursday 11/13. Will proceed regardless of whether biopsy is performed beforehand since he has had progressive weakness over a short time period. We do like to have an idea of the pathology going into the surgery if possible Decadron 4q12 Ok for therapeutic heparin. Will need to stop morning of surgery Activity as tolerated Diet as tolerated   Subjective: Pt c/o severe BLE spasms. His leg paresthesias are unchanged  I had a long discussion with the patient regarding surgery.  Our plan is to perform a posterior cervical thoracic pedicle screw instrumentation from C7-T4, T1-T3 decompression, possible T2 corpectomy.  The goals of the surgery are to stabilize the spine and to decompress the spinal cord for neurologic benefit and for separation in preparation for possible future radiation.  I explained the risk of bleeding, infection, wound dehiscence, CSF leak, spinal cord injury, nerve root injury, hardware failure, general anesthesia risk, mortality, damage nearby organs.  I stressed the risk of worsening pulmonary embolism and the fact that we would have to hold his anticoagulation during the surgery and likely for several days after the surgery.  Unfortunately given the instability of his spine and absence of any remaining bone in the anterior middle columns of T2, I believe a open fixation with corpectomy is necessary.  He verbalized understanding  Objective: Vital signs in last 24 hours: Temp:  [98.3 F (36.8 C)-98.7 F (37.1 C)] 98.4 F (36.9 C) (11/11 0749) Pulse Rate:  [72-96] 72 (11/11 0749) Resp:  [12-15] 12 (11/11 0749) BP: (142-178)/(74-94) 142/74 (11/11  0749) SpO2:  [94 %-98 %] 96 % (11/11 0749)  Intake/Output from previous day: 11/10 0701 - 11/11 0700 In: 1541.5 [I.V.:1541.5] Out: 5000 [Urine:5000] Intake/Output this shift: No intake/output data recorded.  Exam: RUE: 5/5 deltoid, 5/5 bicep, 5/5 tricep, 5/5 grip LUE: 5/5 deltoid, 5/5 bicep, 5/5 tricep, 5/5 grip BLE: 4/5 IP, quad, ham, DF, EHL, PF Subjective sensory level from mid thoracic down bilaterally  Lab Results: Recent Labs    05/02/24 0556 05/03/24 0048  WBC 6.8 10.9*  HGB 13.1 11.7*  HCT 39.9 35.7*  PLT 111* 90*   BMET No results for input(s): NA, K, CL, CO2, GLUCOSE, BUN, CREATININE, CALCIUM in the last 72 hours.     Joseph [name] Glade 05/03/2024, 10:05 AM]

## 2024-05-03 NOTE — Progress Notes (Signed)
 PHARMACY - ANTICOAGULATION CONSULT NOTE  Pharmacy Consult for heparin Indication: pulmonary embolus  Labs: Recent Labs    05/01/24 0621 05/02/24 0556 05/02/24 1300 05/03/24 0048  HGB 12.0* 13.1  --  11.7*  HCT 38.1* 39.9  --  35.7*  PLT 179 111*  --  90*  APTT  --  62* 115* 102*  HEPARINUNFRC  --  0.82*  --  0.86*  Assessment: 43yo male therapeutic on heparin after rate change but at upper end of goal; Plt 179>111>90 noted, no bleeding documented.  Goal of Therapy:  aPTT 66-102 seconds   Plan:  Decrease heparin infusion slightly to 2100 units/hr. Check PTT in 6 hours.   Marvetta Dauphin, PharmD, BCPS 05/03/2024 2:52 AM

## 2024-05-03 NOTE — Plan of Care (Signed)

## 2024-05-03 NOTE — Progress Notes (Signed)
 PROGRESS NOTE    Joseph [name]  FMW:[id number] DOB: 02-Mar-1981 DOA: 04/27/2024 PCP: Patient, No Pcp Per    Brief Narrative:  43 year old gentleman with no significant medical history presented to the hospital with about 4 days of back pain and difficulty walking, he was having some back pain issues for about 2 weeks.  In the emergency room he had CT scan of the lumbar spine that was unrevealing.  He was found to have right upper lobe segmental PE.  He was subsequently started on Eliquis. Patient continued to have difficulty with mobility.  Multiple investigations were done.  MRI of the thoracic spine ultimately showed pathological T2 compression fracture. Thyroid ultrasound with large left thyroid nodule. Transferred to [facility] from [place] for neurosurgery, decompression and biopsy. Remains on heparin.  Subjective: Patient seen and examined.  Today he denies any complaints.  Pain is controlled.  Some stiffness of the right shoulder.  I discussed with IR for thyroid biopsy, they are trying to schedule it. Case discussed with neurosurgery, posted for surgery on 11/13.  I have discussed details with patient and his mother at the bedside yesterday.   Assessment & Plan:   Right upper lobe segmental PE, pulmonary arterial hypertension: New diagnosis.  Underlying metastatic disease found.  Was on Eliquis.  Now on heparin.  Will keep on heparin perioperative.  Pharmacy to manage.  Metastatic malignancy: Patient was found to have left thyroid enlargement more than 4 cm Right humeral bone lucency MRI of the thoracic spine with T2 pathological fracture, severe spinal canal stenosis and cord compression. Also with pulmonary nodules.  Adequate pain management.  Bowel regimen.  Bedrest as per neurosurgery. Soft tissue biopsy, ordering through IR for thyroid biopsy. Neurosurgery for decompression/spine stabilization and biopsy on 11/13. On IV steroids to continue. Will  involve oncology once biopsy available.    DVT prophylaxis: Place and maintain sequential compression device Start: 05/01/24 1226 Heparin infusion  Code Status: Full code Family Communication: None today. Disposition Plan: Status is: Inpatient Remains inpatient appropriate because: On heparin infusion.  Inpatient procedures planned.     Consultants:  Neurosurgery   Procedures:  None  Antimicrobials:  None     Objective: Vitals:   05/02/24 2341 05/03/24 0232 05/03/24 0749 05/03/24 1128  BP: (!) 178/88 (!) 154/84 (!) 142/74 (!) 168/81  Pulse: 82 74 72 71  Resp: 12 12 12  (!) 7  Temp: 98.5 F (36.9 C) 98.3 F (36.8 C) 98.4 F (36.9 C) 99.3 F (37.4 C)  TempSrc: Oral Oral Oral Oral  SpO2: 96% 94% 96% 95%  Weight:      Height:        Intake/Output Summary (Last 24 hours) at 05/03/2024 1136 Last data filed at 05/03/2024 0510 Gross per 24 hour  Intake 1541.52 ml  Output 4450 ml  Net -2908.48 ml   Filed Weights   04/27/24 2357 04/28/24 0100  Weight: (!) 143.5 kg (!) 143.5 kg    Examination:  General exam: Calm and comfortable today.  Interactive. Respiratory system: Clear to auscultation. Respiratory effort normal.  No added sounds. Cardiovascular system: S1 & S2 heard, RRR. No JVD, murmurs, rubs, gallops or clicks. No pedal edema. Gastrointestinal system: Abdomen is nondistended, soft and nontender. No organomegaly or masses felt. Normal bowel sounds heard. Central nervous system: Alert and oriented. No focal neurological deficits. Extremities: Symmetric 5 x 5 power. Patient has some stiffness of the right shoulder.  ROM not elicited .    Data Reviewed:  I have personally reviewed following labs and imaging studies  CBC: Recent Labs  Lab 04/29/24 0516 04/30/24 0604 05/01/24 0621 05/02/24 0556 05/03/24 0048  WBC 4.8 8.3 5.3 6.8 10.9*  HGB 12.4* 12.5* 12.0* 13.1 11.7*  HCT 40.3 39.5 38.1* 39.9 35.7*  MCV 92.6 93.4 94.5 89.1 89.7  PLT 148* 178 179  111* 90*   Basic Metabolic Panel: Recent Labs  Lab 04/27/24 2339 04/29/24 0516  NA 137 135  K 4.5 4.3  CL 99 101  CO2 29 28  GLUCOSE 107* 137*  BUN 8 11  CREATININE 0.94 0.87  CALCIUM 9.5 9.4   GFR: Estimated Creatinine Clearance: 154.5 mL/min (by C-G formula based on SCr of 0.87 mg/dL). Liver Function Tests: Recent Labs  Lab 04/28/24 0000  AST 28  ALT 43  ALKPHOS 98  BILITOT 0.4  PROT 7.9  ALBUMIN 3.7   No results for input(s): LIPASE, AMYLASE in the last 168 hours. No results for input(s): AMMONIA in the last 168 hours. Coagulation Profile: No results for input(s): INR, PROTIME in the last 168 hours. Cardiac Enzymes: No results for input(s): CKTOTAL, CKMB, CKMBINDEX, TROPONINI in the last 168 hours. BNP (last 3 results) No results for input(s): PROBNP in the last 8760 hours. HbA1C: No results for input(s): HGBA1C in the last 72 hours. CBG: Recent Labs  Lab 05/02/24 0159  GLUCAP 136*   Lipid Profile: No results for input(s): CHOL, HDL, LDLCALC, TRIG, CHOLHDL, LDLDIRECT in the last 72 hours. Thyroid Function Tests: No results for input(s): TSH, T4TOTAL, FREET4, T3FREE, THYROIDAB in the last 72 hours.  Anemia Panel: No results for input(s): VITAMINB12, FOLATE, FERRITIN, TIBC, IRON, RETICCTPCT in the last 72 hours. Sepsis Labs: No results for input(s): PROCALCITON, LATICACIDVEN in the last 168 hours.  No results found for this or any previous visit (from the past 240 hours).       Radiology Studies: MRI and CT scans reviewed       Scheduled Meds:  acetaminophen   1,000 mg Oral Q8H   Chlorhexidine  Gluconate Cloth  6 each Topical Daily   dexamethasone (DECADRON) injection  4 mg Intravenous Q12H   fentaNYL   1 patch Transdermal Q72H   methocarbamol  500 mg Oral TID   sodium chloride  flush  10-40 mL Intracatheter Q12H   sodium chloride  flush  3 mL Intravenous Q12H   Continuous Infusions:   heparin 2,100 Units/hr (05/03/24 0922)     LOS: 1 day    Time spent: 51 minutes    Renato Applebaum, MD [facility]

## 2024-05-03 NOTE — Progress Notes (Signed)
 PHARMACY - ANTICOAGULATION CONSULT NOTE  Pharmacy Consult for heparin Indication: pulmonary embolus  Patient Measurements: Height: 5' 9 (175.3 cm) Weight: (!) 143.5 kg (316 lb 5.8 oz) IBW/kg (Calculated) : 70.7 HEPARIN DW (KG): 104.9  Labs: Recent Labs    05/01/24 0621 05/01/24 0621 05/02/24 0556 05/02/24 1300 05/03/24 0048 05/03/24 0810  HGB 12.0*  --  13.1  --  11.7*  --   HCT 38.1*  --  39.9  --  35.7*  --   PLT 179  --  111*  --  90*  --   APTT  --    < > 62* 115* 102* 98*  HEPARINUNFRC  --   --  0.82*  --  0.86*  --    < > = values in this interval not displayed.  Assessment: 26 yoM diagnosed with PE on 11/6.  He was initially on heparin IV (11/6-11/7) followed by apixaban (11/7-11/9).  Anticoagulation was held on 11/9 for MRI.  Neurosurgery consulted for metastatic disease with fracture at T2, but OK to continue with therapeutic heparin per notes.  Pharmacy is consulted to dose Heparin. Last apixaban dose taken on 11/9 at 6am.    11/11 AM: aPTT 98 - therapeutic for goal on Heparin IV 2100 units/hr.  Platelets trending down - 90. Hgb 11.7 - low-stable.  No bleeding reported.   Goal of Therapy:  aPTT 66-102 seconds   Plan:  Continue Heparin at 2100 units/hr. Daily HL.   Harlene Boga, PharmD, BCPS, BCCCP Clinical Pharmacist 05/03/2024  10:20 AM **Pharmacist phone directory can now be found on [email or web address] ([email or web address]).  Listed under [facility].

## 2024-05-04 ENCOUNTER — Inpatient Hospital Stay (HOSPITAL_COMMUNITY): Payer: MEDICAID

## 2024-05-04 DIAGNOSIS — I2699 Other pulmonary embolism without acute cor pulmonale: Secondary | ICD-10-CM | POA: Diagnosis not present

## 2024-05-04 DIAGNOSIS — M8458XG Pathological fracture in neoplastic disease, other specified site, subsequent encounter for fracture with delayed healing: Secondary | ICD-10-CM | POA: Diagnosis not present

## 2024-05-04 LAB — CBC
HCT: 37.2 % — ABNORMAL LOW (ref 39.0–52.0)
Hemoglobin: 12.5 g/dL — ABNORMAL LOW (ref 13.0–17.0)
MCH: 30 pg (ref 26.0–34.0)
MCHC: 33.6 g/dL (ref 30.0–36.0)
MCV: 89.4 fL (ref 80.0–100.0)
Platelets: 119 K/uL — ABNORMAL LOW (ref 150–400)
RBC: 4.16 MIL/uL — ABNORMAL LOW (ref 4.22–5.81)
RDW: 12 % (ref 11.5–15.5)
WBC: 10.1 K/uL (ref 4.0–10.5)
nRBC: 0 % (ref 0.0–0.2)

## 2024-05-04 LAB — BASIC METABOLIC PANEL WITH GFR
Anion gap: 11 (ref 5–15)
BUN: 13 mg/dL (ref 6–20)
CO2: 25 mmol/L (ref 22–32)
Calcium: 9 mg/dL (ref 8.9–10.3)
Chloride: 101 mmol/L (ref 98–111)
Creatinine, Ser: 0.93 mg/dL (ref 0.61–1.24)
GFR, Estimated: >60 mL/min (ref >60)
Glucose, Bld: 112 mg/dL — ABNORMAL HIGH (ref 70–99)
Potassium: 4.1 mmol/L (ref 3.5–5.1)
Sodium: 137 mmol/L (ref 135–145)

## 2024-05-04 LAB — SURGICAL PCR SCREEN
MRSA, PCR: NEGATIVE
Staphylococcus aureus: POSITIVE — AB

## 2024-05-04 LAB — PROTIME-INR
INR: 1.3 — ABNORMAL HIGH (ref 0.8–1.2)
Prothrombin Time: 16.6 s — ABNORMAL HIGH (ref 11.4–15.2)

## 2024-05-04 LAB — HEPARIN LEVEL (UNFRACTIONATED): Heparin Unfractionated: 0.68 [IU]/mL (ref 0.30–0.70)

## 2024-05-04 LAB — APTT: aPTT: 79 s — ABNORMAL HIGH (ref 24–36)

## 2024-05-04 LAB — PREPARE RBC (CROSSMATCH)

## 2024-05-04 LAB — ABO/RH: ABO/RH(D): A POS

## 2024-05-04 MED ORDER — CHLORHEXIDINE GLUCONATE CLOTH 2 % EX PADS
6.0000 | MEDICATED_PAD | Freq: Once | CUTANEOUS | Status: AC
  Administered 2024-05-04: 6 via TOPICAL

## 2024-05-04 MED ORDER — SODIUM CHLORIDE 0.9% IV SOLUTION: Freq: Once | INTRAVENOUS | Status: DC

## 2024-05-04 MED ORDER — CHLORHEXIDINE GLUCONATE CLOTH 2 % EX PADS: 6.0000 | MEDICATED_PAD | Freq: Once | CUTANEOUS | Status: AC

## 2024-05-04 MED ORDER — CEFAZOLIN SODIUM-DEXTROSE 3-4 GM/150ML-% IV SOLN
3.0000 g | INTRAVENOUS | Status: AC
  Administered 2024-05-05 (×2): 3 g via INTRAVENOUS
  Filled 2024-05-04: qty 150

## 2024-05-04 MED ORDER — LIDOCAINE HCL (PF) 1 % IJ SOLN
10.0000 mL | Freq: Once | INTRAMUSCULAR | Status: AC
  Administered 2024-05-04: 10 mL via INTRADERMAL

## 2024-05-04 NOTE — Progress Notes (Signed)
 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  LOS: 3 days    Plan: OR today Decadron 4q12 Therapeutic heparin end time on hold Activity as tolerated NPO   Subjective: Neuro stable  Objective: Vital signs in last 24 hours: Temp:  [98.6 F (37 C)-99 F (37.2 C)] 98.7 F (37.1 C) (11/13 0641) Pulse Rate:  [76-92] 76 (11/13 0641) Resp:  [10-18] 18 (11/13 0641) BP: (142-161)/(74-84) 161/82 (11/13 0641) SpO2:  [93 %-97 %] 94 % (11/13 0641) Weight:  [143.5 kg] 143.5 kg (11/13 0641)  Intake/Output from previous day: 11/12 0701 - 11/13 0700 In: -  Out: 3250 [Urine:3250] Intake/Output this shift: No intake/output data recorded.  Exam: RUE: 5/5 deltoid, 5/5 bicep, 5/5 tricep, 5/5 grip LUE: 5/5 deltoid, 5/5 bicep, 5/5 tricep, 5/5 grip BLE: 4/5 IP, quad, ham, DF, EHL, PF Subjective sensory level from mid thoracic down bilaterally  Lab Results: Recent Labs    05/04/24 0500 05/05/24 0417  WBC 10.1 11.6*  HGB 12.5* 12.8*  HCT 37.2* 38.3*  PLT 119* 112*   BMET Recent Labs    05/04/24 0910  NA 137  K 4.1  CL 101  CO2 25  GLUCOSE 112*  BUN 13  CREATININE 0.93  CALCIUM 9.0       Joseph [name] 05/05/2024, 7:30 AM]

## 2024-05-04 NOTE — Progress Notes (Signed)
 PROGRESS NOTE   Joseph [name]  FMW:[id number]    DOB: 1980/12/22    DOA: 04/27/2024  PCP: Patient, No Pcp Per   I have briefly reviewed patients previous medical records in [facility].   Brief Hospital Course:  43 year old gentleman with no significant medical history presented to the hospital with about 4 days of back pain and difficulty walking, he was having some back pain issues for about 2 weeks.  CT scan of the lumbar spine was unrevealing.  CTA chest PE protocol showed right upper lobe segmental PE. He was started on Eliquis. Patient continued to have difficulty with mobility.  Multiple investigations were done.  MRI of the thoracic spine ultimately showed pathological T2 compression fracture. Thyroid ultrasound showed large left thyroid nodule.  He was transferred to [facility] from [place] for Neurosurgery, decompression and biopsy, planned for 11/13.  IR plans biopsy of thyroid nodule?  11/12.  Transitioned to IV heparin for procedures.     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 Dr. Renato Maha, [facility] MD discussed with IR regarding thyroid biopsy on 11/11, timing unclear,?  For today. Therapies to reassess post surgery.  PT on 11/7 recommended home health PT and OT on 11/9 recommended AIR. Multimodality pain control Plan to consult medical oncology pending pathology results.  Right upper lobe segmental PE Pulmonary artery hypertension Currently on IV heparin bridge pending procedures prior to transitioning back to Eliquis. Given possible cancer diagnosis, anticoagulation will be prolonged.  Thrombocytopenia Mild and stable,?  Related to IV  heparin.  Monitor closely.  Body mass index is 46.72 kg/m./Very morbid obesity class III Complicates care.  Outpatient follow-up.   DVT prophylaxis: SCD's Start: 05/04/24 9187 Place and maintain sequential compression device Start: 05/01/24 1226     Code Status: Full Code:  Family Communication: None at bedside. Disposition:  Status is: Inpatient Remains inpatient appropriate because: Pending above procedures.     Consultants:   Neurosurgery Neurology Interventional radiology  Procedures:     Subjective:  Seen this morning.  Reports right shoulder and mid upper back pain.  Numbness of both thighs but has sensations.  No other complaints.  Objective:   Vitals:   05/03/24 1956 05/03/24 2319 05/04/24 0349 05/04/24 0833  BP: (!) 145/79 (!) 163/84 (!) 168/88 (!) 161/80  Pulse: 94 93 85 86  Resp: 16 13 13 12   Temp: 98.5 F (36.9 C) 98.4 F (36.9 C) 98.9 F (37.2 C) 98.8 F (37.1 C)  TempSrc: Oral Oral Oral Oral  SpO2: 96% 95% 94% 97%  Weight:      Height:        General exam: Young male, moderately built and morbidly obese lying comfortably propped up in bed without distress. Respiratory system: Clear to auscultation. Respiratory effort normal. Cardiovascular system: S1 & S2 heard, RRR. No JVD, murmurs, rubs, gallops or clicks. No pedal edema.  Telemetry personally reviewed at bedside: Sinus rhythm. Gastrointestinal system: Abdomen is nondistended, soft and nontender. No organomegaly or masses felt. Normal bowel sounds heard. Central nervous system: Alert and oriented. No focal neurological deficits. Extremities: Symmetric 4 x 5 power at least in all limbs. Skin: No rashes, lesions or ulcers Psychiatry: Judgement and insight appear normal. Mood & affect appropriate.  Data Reviewed:   I have personally reviewed following labs and imaging studies   CBC: Recent Labs  Lab 05/02/24 0556 05/03/24 0048 05/04/24 0500  WBC 6.8 10.9* 10.1  HGB 13.1 11.7* 12.5*   HCT 39.9 35.7* 37.2*  MCV 89.1 89.7 89.4  PLT 111* 90* 119*    Basic Metabolic Panel: Recent Labs  Lab 04/27/24 2339 04/29/24 0516 05/04/24 0910  NA 137 135 137  K 4.5 4.3 4.1  CL 99 101 101  CO2 29 28 25   GLUCOSE 107* 137* 112*  BUN 8 11 13   CREATININE 0.94 0.87 0.93  CALCIUM 9.5 9.4 9.0    Liver Function Tests: Recent Labs  Lab 04/28/24 0000  AST 28  ALT 43  ALKPHOS 98  BILITOT 0.4  PROT 7.9  ALBUMIN 3.7    CBG: Recent Labs  Lab 05/02/24 0159  GLUCAP 136*    Microbiology Studies:  No results found for this or any previous visit (from the past 240 hours).  Radiology Studies:  DG CHEST PORT 1 VIEW Result Date: 05/02/2024 CLINICAL DATA:  PICC line placement EXAM: PORTABLE CHEST 1 VIEW COMPARISON:  04/27/2024 FINDINGS: Left-sided PICC line tip: Lower SVC. No pneumothorax or complicating feature. The patient has known pulmonary nodules in both lungs much better shown on prior CT scan. Rightward tracheal deviation due to known left thyroid enlargement. 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 IMPRESSION: 1. Left-sided PICC line tip: Lower SVC. No pneumothorax or complicating feature. 2. The patient has known pulmonary nodules and a lytic mass of the T2 vertebral body, much better shown on prior CT scan. 3. Rightward tracheal deviation due to known left thyroid enlargement. Electronically Signed   By: Ryan Salvage M.D.   On: 05/02/2024 19:33   CT CHEST ABDOMEN PELVIS W CONTRAST Result Date: 05/02/2024 CLINICAL DATA:  Metastatic disease evaluation * Tracking Code: BO * EXAM: CT CHEST, ABDOMEN, AND PELVIS WITH CONTRAST TECHNIQUE: Multidetector CT imaging of the chest, abdomen and pelvis was performed following the standard protocol during bolus administration of intravenous contrast. RADIATION DOSE REDUCTION: This exam was performed according to the departmental dose-optimization program which includes automated  exposure control, adjustment of the mA and/or kV according to patient size and/or use of iterative reconstruction technique. CONTRAST:  OMNIPAQUE IOHEXOL 350 MG/ML SOLN COMPARISON:  CT chest angiogram, 04/28/2024 FINDINGS: CT CHEST FINDINGS Cardiovascular: Left upper extremity PICC, tip malpositioned, crossing the brachiocephalic confluence and directed into the right internal jugular (series 2, image 7). Normal heart size. No pericardial effusion. Mediastinum/Nodes: No enlarged mediastinal, hilar, or axillary lymph nodes. Small hiatal hernia. Enlarged left lobe of the thyroid, partially imaged, measuring at least 5.1 cm (series 2, image 1). Trachea, and esophagus demonstrate no significant findings. Lungs/Pleura: Numerous small bilateral pulmonary nodules throughout both lungs, at least 30, measuring 0.6 cm and smaller (series 26, image 94). No pleural effusion or pneumothorax. Musculoskeletal: No chest wall abnormality. Unchanged lytic lesion of the T2 vertebral body with an associated soft tissue component, separately reported by same day CT of the thoracic spine (series 2, image 10). CT ABDOMEN PELVIS FINDINGS Hepatobiliary: No solid liver abnormality is seen. Numerous small gallstones. Gallbladder wall thickening, or biliary dilatation. Pancreas: Unremarkable. No pancreatic ductal dilatation or surrounding inflammatory changes. Spleen: Normal in size without significant abnormality. Adrenals/Urinary Tract: Adrenal glands are unremarkable. Kidneys are normal, without renal calculi, solid lesion, or hydronephrosis. Bladder is unremarkable. Stomach/Bowel: Stomach is within  normal limits. Transverse duodenal diverticulum. Appendix appears normal. No evidence of bowel wall thickening, distention, or inflammatory changes. Vascular/Lymphatic: No significant vascular findings are present. No enlarged abdominal or pelvic lymph nodes. Reproductive: No mass or other abnormality. Other: Small fat containing umbilical  hernia.  No ascites. Musculoskeletal: No acute osseous findings. IMPRESSION: 1. Numerous small bilateral pulmonary nodules throughout both lungs, at least 30, measuring 0.6 cm and smaller. Findings are highly suspicious for pulmonary metastatic disease given constellation of other findings. 2. 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 3. Enlarged left lobe of the thyroid, partially imaged, measuring at least 5.1 cm, possibly incidental and benign although primary thyroid malignancy is a differential consideration. This has been previously evaluated by thyroid ultrasound. 4. No evidence of lymphadenopathy or metastatic disease in the abdomen or pelvis. 5. Left upper extremity PICC, tip malpositioned, crossing the brachiocephalic confluence and directed into the right internal jugular. 6. Cholelithiasis. These results will be called to the ordering clinician or representative by the Radiologist Assistant, and communication documented in the PACS or [employer]. Electronically Signed   By: Marolyn JONETTA Jaksch M.D.   On: 05/02/2024 16:33   CT CERVICAL SPINE WO CONTRAST Result Date: 05/02/2024 EXAM: CT CERVICAL SPINE AND THORACIC SPINE WITHOUT CONTRAST 05/02/2024 01:14:06 PM TECHNIQUE: CT of the cervical and thoracic was performed without the administration of intravenous contrast. Multiplanar reformatted images are provided for review. Automated exposure control, iterative reconstruction, and/or weight based adjustment of the mA/kV was utilized to reduce the radiation dose to as low as reasonably achievable. COMPARISON: MRI of cervical and thoracic spine dated 05/01/2024. CLINICAL HISTORY: Metastatic disease evaluation. FINDINGS: Evaluation is limited due to photon starvation and motion artifacts. CERVICAL SPINE: BONES AND ALIGNMENT: There is no acute fracture or traumatic malalignment. The vertebral  body heights are maintained. There is a lucent lesion within the C6 vertebral body which may represent benign etiology such as hemangioma given its internal stippled appearance. DEGENERATIVE CHANGES: Evaluation of the spinal canal and its contents are also seen to better advantage on recent MRI, see separately dictated report. SOFT TISSUES: Redemonstrated markedly enlarged left lobe of the thyroid with rightward tracheal deviation and mild compression. THORACIC SPINE: BONES AND ALIGNMENT: The thoracic kyphosis is overall preserved without significant vertebral column listhesis. There is upper thoracic levocurvature. Redemonstrated soft tissue replacement of the T2 vertebral body that extends into the spinal canal, bilateral neural T1-T2 and T2-T3 neural foramen with associated spinal canal and neural foraminal stenosis seen to better advantage on recent MRI with intravenous contrast 05/01/2024. There is additionally circumferential and anterior soft tissue extension into the paravertebral space. The remaining vertebral body heights are maintained. There are lucent lesions within the T1, T3, and T6 vertebral bodies which may represent benign etiology such as hemangioma, given their internal stippled appearance. DEGENERATIVE CHANGES: Evaluation of the spinal canal and its contents seen to better advantage on recent MRI, see separately dictated report. SOFT TISSUES: Pulmonary nodules, please see separately dictated report for same day CT chest, abdomen, and pelvis with IV contrast 05/02/2024 for further relevant evaluation. INCIDENTAL LUMBAR FINDINGS: Axial images obtained through the lumbar spine demonstrate a destructive lesion involving primarily the L5 inferior endplate as well as the S1 superior endplate with adjacent sclerosis. IMPRESSION: 1. Since 05/01/2024, redemonstrated destructive soft tissue replacement of the T2 vertebral body with circumferential extension involving the spinal canal, bilateral neural  foraminal,  and paravertebral spaces, all seen to better advantage on recent MRI. 2. Lucent lesion within the C6 vertebral body is suspicious for metastatic disease. 3. Aside from T2, no acute cervical or thoracic vertebral fracture or malalignment. 4. Pulmonary nodules, please see separately dictated report for same day CT chest, abdomen, and pelvis with IV contrast 05/02/2024 for further relevant evaluation. Electronically signed by: prentice bybordi 05/02/2024 04:03 PM EST RP Workstation: [id number]   CT T-SPINE NO CHARGE Result Date: 05/02/2024 EXAM: CT CERVICAL SPINE AND THORACIC SPINE WITHOUT CONTRAST 05/02/2024 01:14:06 PM TECHNIQUE: CT of the cervical and thoracic was performed without the administration of intravenous contrast. Multiplanar reformatted images are provided for review. Automated exposure control, iterative reconstruction, and/or weight based adjustment of the mA/kV was utilized to reduce the radiation dose to as low as reasonably achievable. COMPARISON: MRI of cervical and thoracic spine dated 05/01/2024. CLINICAL HISTORY: Metastatic disease evaluation. FINDINGS: Evaluation is limited due to photon starvation and motion artifacts. CERVICAL SPINE: BONES AND ALIGNMENT: There is no acute fracture or traumatic malalignment. The vertebral body heights are maintained. There is a lucent lesion within the C6 vertebral body which may represent benign etiology such as hemangioma given its internal stippled appearance. DEGENERATIVE CHANGES: Evaluation of the spinal canal and its contents are also seen to better advantage on recent MRI, see separately dictated report. SOFT TISSUES: Redemonstrated markedly enlarged left lobe of the thyroid with rightward tracheal deviation and mild compression. THORACIC SPINE: BONES AND ALIGNMENT: The thoracic kyphosis is overall preserved without significant vertebral column listhesis. There is upper thoracic levocurvature. Redemonstrated soft tissue replacement of the  T2 vertebral body that extends into the spinal canal, bilateral neural T1-T2 and T2-T3 neural foramen with associated spinal canal and neural foraminal stenosis seen to better advantage on recent MRI with intravenous contrast 05/01/2024. There is additionally circumferential and anterior soft tissue extension into the paravertebral space. The remaining vertebral body heights are maintained. There are lucent lesions within the T1, T3, and T6 vertebral bodies which may represent benign etiology such as hemangioma, given their internal stippled appearance. DEGENERATIVE CHANGES: Evaluation of the spinal canal and its contents seen to better advantage on recent MRI, see separately dictated report. SOFT TISSUES: Pulmonary nodules, please see separately dictated report for same day CT chest, abdomen, and pelvis with IV contrast 05/02/2024 for further relevant evaluation. INCIDENTAL LUMBAR FINDINGS: Axial images obtained through the lumbar spine demonstrate a destructive lesion involving primarily the L5 inferior endplate as well as the S1 superior endplate with adjacent sclerosis. IMPRESSION: 1. Since 05/01/2024, redemonstrated destructive soft tissue replacement of the T2 vertebral body with circumferential extension involving the spinal canal, bilateral neural foraminal, and paravertebral spaces, all seen to better advantage on recent MRI. 2. Lucent lesion within the C6 vertebral body is suspicious for metastatic disease. 3. Aside from T2, no acute cervical or thoracic vertebral fracture or malalignment. 4. Pulmonary nodules, please see separately dictated report for same day CT chest, abdomen, and pelvis with IV contrast 05/02/2024 for further relevant evaluation. Electronically signed by: prentice spade 05/02/2024 04:03 PM EST RP Workstation: [id number]    Scheduled Meds:    sodium chloride    Intravenous Once   acetaminophen   1,000 mg Oral Q8H   [START ON 05/05/2024]  ceFAZolin  (ANCEF ) IV  3 g Intravenous On  Call to OR   Chlorhexidine  Gluconate Cloth  6 each Topical Daily   dexamethasone (DECADRON) injection  4 mg Intravenous Q12H   fentaNYL   1 patch Transdermal Q72H   methocarbamol  500 mg Oral TID   sodium chloride  flush  10-40 mL Intracatheter Q12H   sodium chloride  flush  3 mL Intravenous Q12H    Continuous Infusions:    heparin 2,100 Units/hr (05/03/24 2211)     LOS: 2 days     Trenda Mar, MD,  FACP, [facility], [facility], [facility]   [facility] [place]      To contact the attending provider between 7A-7P or the covering provider during after hours 7P-7A, please log into the web site [email or web address] and access using universal [place] password for that web site. If you do not have the password, please call the hospital operator.  05/04/2024, 10:29 AM

## 2024-05-04 NOTE — Progress Notes (Addendum)
 PHARMACY - ANTICOAGULATION CONSULT NOTE  Pharmacy Consult for heparin Indication: pulmonary embolus  Patient Measurements: Height: 5' 9 (175.3 cm) Weight: (!) 143.5 kg (316 lb 5.8 oz) IBW/kg (Calculated) : 70.7 HEPARIN DW (KG): 104.9  Labs: Recent Labs    05/02/24 0556 05/02/24 1300 05/03/24 0048 05/03/24 0810 05/04/24 0500 05/04/24 0910  HGB 13.1  --  11.7*  --  12.5*  --   HCT 39.9  --  35.7*  --  37.2*  --   PLT 111*  --  90*  --  119*  --   APTT 62*   < > 102* 98* 79*  --   LABPROT  --   --   --   --   --  16.6*  INR  --   --   --   --   --  1.3*  HEPARINUNFRC 0.82*  --  0.86*  --  0.68  --   CREATININE  --   --   --   --   --  0.93   < > = values in this interval not displayed.  Assessment: 86 yoM diagnosed with PE on 11/6.  He was initially on heparin IV (11/6-11/7) followed by apixaban (11/7-11/9).  Anticoagulation was held on 11/9 for MRI.  Neurosurgery consulted for metastatic disease with fracture at T2, but OK to continue with therapeutic heparin per notes.  Pharmacy is consulted to dose Heparin. Last apixaban dose taken on 11/9 at 6am.    11/11 AM: aPTT 79 (HL 0.68 -starting to correlate) - therapeutic for goal on Heparin IV 2100 units/hr.  Platelets trending down - 90. Hgb 11.7 - low-stable.  No bleeding reported.   Goal of Therapy:  Heparin level 0.3-0.7 units/ml aPTT 66-102 seconds   Plan:  Continue Heparin at 2100 units/hr. Heparin to end at 0001 AM per Neurosurgery for cervical fusion.  Follow-up post operatively for [facility] plan.   Harlene Boga, PharmD, BCPS, BCCCP Clinical Pharmacist 05/04/2024  2:44 PM **Pharmacist phone directory can now be found on [email or web address] ([email or web address]).  Listed under [facility].

## 2024-05-04 NOTE — Procedures (Signed)
 PROCEDURE SUMMARY:  Using direct ultrasound guidance, 5 passes were made using 25 g needles into nodule #1 within left lobe of the thyroid.   Ultrasound was used to confirm needle placements on all occasions.   EBL = trace  Specimens were sent to Pathology for analysis.  See procedure note under Imaging tab in Epic for full procedure details.  Carlin LABOR Vernessa Likes PA-C 05/04/2024 3:52 PM

## 2024-05-04 NOTE — Progress Notes (Signed)
 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  LOS: 2 days   Plan: Surgical intervention planned for tomorrow. Will proceed regardless of whether biopsy is performed beforehand since he has had progressive weakness over a short time period. We do like to have an idea of the pathology going into the surgery if possible Decadron 4q12 Therapeutic heparin end time for midnight tonight Activity as tolerated NPO@MN    Subjective: Neurologically unchanged  Objective: Vital signs in last 24 hours: Temp:  [98.4 F (36.9 C)-99.3 F (37.4 C)] 98.9 F (37.2 C) (11/12 0349) Pulse Rate:  [71-94] 85 (11/12 0349) Resp:  [7-16] 13 (11/12 0349) BP: (145-168)/(72-88) 168/88 (11/12 0349) SpO2:  [93 %-96 %] 94 % (11/12 0349)  Intake/Output from previous day: 11/11 0701 - 11/12 0700 In: -  Out: 1800 [Urine:1800] Intake/Output this shift: No intake/output data recorded.  Exam: RUE: 5/5 deltoid, 5/5 bicep, 5/5 tricep, 5/5 grip LUE: 5/5 deltoid, 5/5 bicep, 5/5 tricep, 5/5 grip BLE: 4/5 IP, quad, ham, DF, EHL, PF Subjective sensory level from mid thoracic down bilaterally  Lab Results: Recent Labs    05/03/24 0048 05/04/24 0500  WBC 10.9* 10.1  HGB 11.7* 12.5*  HCT 35.7* 37.2*  PLT 90* 119*   BMET No results for input(s): NA, K, CL, CO2, GLUCOSE, BUN, CREATININE, CALCIUM in the last 72 hours.     Joseph [name] Glade 05/04/2024, 8:03 AM]

## 2024-05-05 ENCOUNTER — Inpatient Hospital Stay (HOSPITAL_COMMUNITY): Payer: MEDICAID

## 2024-05-05 ENCOUNTER — Inpatient Hospital Stay (HOSPITAL_COMMUNITY)
Admission: EM | Disposition: A | Discharge status: Rehabilitation Facility | Payer: Self-pay | Source: Home / Self Care | Attending: Internal Medicine

## 2024-05-05 ENCOUNTER — Encounter (HOSPITAL_COMMUNITY): Payer: Self-pay | Admitting: Internal Medicine

## 2024-05-05 ENCOUNTER — Inpatient Hospital Stay (HOSPITAL_COMMUNITY): Payer: MEDICAID | Admitting: Anesthesiology

## 2024-05-05 DIAGNOSIS — D492 Neoplasm of unspecified behavior of bone, soft tissue, and skin: Secondary | ICD-10-CM | POA: Diagnosis not present

## 2024-05-05 DIAGNOSIS — F17210 Nicotine dependence, cigarettes, uncomplicated: Secondary | ICD-10-CM | POA: Diagnosis not present

## 2024-05-05 DIAGNOSIS — J45909 Unspecified asthma, uncomplicated: Secondary | ICD-10-CM | POA: Diagnosis not present

## 2024-05-05 DIAGNOSIS — I2699 Other pulmonary embolism without acute cor pulmonale: Secondary | ICD-10-CM | POA: Diagnosis not present

## 2024-05-05 DIAGNOSIS — F418 Other specified anxiety disorders: Secondary | ICD-10-CM

## 2024-05-05 DIAGNOSIS — E049 Nontoxic goiter, unspecified: Secondary | ICD-10-CM | POA: Diagnosis not present

## 2024-05-05 HISTORY — PX: POSTERIOR CERVICAL FUSION/FORAMINOTOMY: SHX5038

## 2024-05-05 LAB — POCT I-STAT 7, (LYTES, BLD GAS, ICA,H+H)
Acid-base deficit: 2 mmol/L (ref 0.0–2.0)
Acid-base deficit: 3 mmol/L — ABNORMAL HIGH (ref 0.0–2.0)
Acid-base deficit: 2 mmol/L (ref 0.0–2.0)
Bicarbonate: 24.2 mmol/L (ref 20.0–28.0)
Bicarbonate: 22.1 mmol/L (ref 20.0–28.0)
Bicarbonate: 22.9 mmol/L (ref 20.0–28.0)
Calcium, Ion: 1.22 mmol/L (ref 1.15–1.40)
Calcium, Ion: 1.17 mmol/L (ref 1.15–1.40)
Calcium, Ion: 1.1 mmol/L — ABNORMAL LOW (ref 1.15–1.40)
HCT: 36 % — ABNORMAL LOW (ref 39.0–52.0)
HCT: 34 % — ABNORMAL LOW (ref 39.0–52.0)
HCT: 31 % — ABNORMAL LOW (ref 39.0–52.0)
Hemoglobin: 12.2 g/dL — ABNORMAL LOW (ref 13.0–17.0)
Hemoglobin: 11.6 g/dL — ABNORMAL LOW (ref 13.0–17.0)
Hemoglobin: 10.5 g/dL — ABNORMAL LOW (ref 13.0–17.0)
O2 Saturation: 100 %
O2 Saturation: 99 %
O2 Saturation: 100 %
Patient temperature: 35.6
Patient temperature: 36.3
Patient temperature: 37.1
Potassium: 4.1 mmol/L (ref 3.5–5.1)
Potassium: 4.2 mmol/L (ref 3.5–5.1)
Potassium: 4.4 mmol/L (ref 3.5–5.1)
Sodium: 136 mmol/L (ref 135–145)
Sodium: 135 mmol/L (ref 135–145)
Sodium: 137 mmol/L (ref 135–145)
TCO2: 26 mmol/L (ref 22–32)
TCO2: 23 mmol/L (ref 22–32)
TCO2: 24 mmol/L (ref 22–32)
pCO2 arterial: 41.7 mmHg (ref 32–48)
pCO2 arterial: 38 mmHg (ref 32–48)
pCO2 arterial: 40.9 mmHg (ref 32–48)
pH, Arterial: 7.364 (ref 7.35–7.45)
pH, Arterial: 7.369 (ref 7.35–7.45)
pH, Arterial: 7.357 (ref 7.35–7.45)
pO2, Arterial: 250 mmHg — ABNORMAL HIGH (ref 83–108)
pO2, Arterial: 166 mmHg — ABNORMAL HIGH (ref 83–108)
pO2, Arterial: 178 mmHg — ABNORMAL HIGH (ref 83–108)

## 2024-05-05 LAB — CBC
HCT: 38.3 % — ABNORMAL LOW (ref 39.0–52.0)
Hemoglobin: 12.8 g/dL — ABNORMAL LOW (ref 13.0–17.0)
MCH: 29.9 pg (ref 26.0–34.0)
MCHC: 33.4 g/dL (ref 30.0–36.0)
MCV: 89.5 fL (ref 80.0–100.0)
Platelets: 112 K/uL — ABNORMAL LOW (ref 150–400)
RBC: 4.28 MIL/uL (ref 4.22–5.81)
RDW: 12.2 % (ref 11.5–15.5)
WBC: 11.6 K/uL — ABNORMAL HIGH (ref 4.0–10.5)
nRBC: 0 % (ref 0.0–0.2)

## 2024-05-05 LAB — HEPARIN LEVEL (UNFRACTIONATED): Heparin Unfractionated: <0.1 [IU]/mL — ABNORMAL LOW (ref 0.30–0.70)

## 2024-05-05 LAB — APTT: aPTT: 29 s (ref 24–36)

## 2024-05-05 SURGERY — POSTERIOR CERVICAL FUSION/FORAMINOTOMY LEVEL 4
Anesthesia: General

## 2024-05-05 MED ORDER — NALOXONE HCL 0.4 MG/ML IJ SOLN
0.4000 mg | INTRAMUSCULAR | Status: DC | PRN
Start: 2024-05-05 — End: 2024-05-08

## 2024-05-05 MED ORDER — HYDROMORPHONE HCL 1 MG/ML IJ SOLN
0.2500 mg | INTRAMUSCULAR | Status: DC | PRN
  Administered 2024-05-05 (×4): 0.5 mg via INTRAVENOUS

## 2024-05-05 MED ORDER — MIDAZOLAM HCL 2 MG/2ML IJ SOLN
INTRAMUSCULAR | Status: AC
Start: 2024-05-05 — End: 2024-05-05
  Filled 2024-05-05: qty 2

## 2024-05-05 MED ORDER — THROMBIN 20000 UNITS EX SOLR
CUTANEOUS | Status: DC | PRN
  Administered 2024-05-05: 20 mL via TOPICAL

## 2024-05-05 MED ORDER — THROMBIN 5000 UNITS EX SOLR
OROMUCOSAL | Status: DC | PRN
  Administered 2024-05-05 (×7): 5 mL via TOPICAL

## 2024-05-05 MED ORDER — THROMBIN 5000 UNITS EX KIT
PACK | CUTANEOUS | Status: AC
  Filled 2024-05-05: qty 2

## 2024-05-05 MED ORDER — THROMBIN 5000 UNITS EX KIT
PACK | CUTANEOUS | Status: AC
  Filled 2024-05-05: qty 1

## 2024-05-05 MED ORDER — HYDROMORPHONE 1 MG/ML IV SOLN
INTRAVENOUS | Status: DC
  Administered 2024-05-05: 30 mg via INTRAVENOUS
  Administered 2024-05-06: 2.7 mg via INTRAVENOUS
  Administered 2024-05-07: 30 mg via INTRAVENOUS
  Administered 2024-05-08: 1 mg via INTRAVENOUS
  Administered 2024-05-08: 4.2 mg via INTRAVENOUS
  Filled 2024-05-05 (×2): qty 30

## 2024-05-05 MED ORDER — 0.9 % SODIUM CHLORIDE (POUR BTL) OPTIME
TOPICAL | Status: DC | PRN
  Administered 2024-05-05 (×2): 1000 mL

## 2024-05-05 MED ORDER — FENTANYL CITRATE (PF) 100 MCG/2ML IJ SOLN
INTRAMUSCULAR | Status: AC
  Filled 2024-05-05: qty 2

## 2024-05-05 MED ORDER — AMISULPRIDE (ANTIEMETIC) 5 MG/2ML IV SOLN: 10.0000 mg | Freq: Once | INTRAVENOUS | Status: DC | PRN

## 2024-05-05 MED ORDER — DEXAMETHASONE 4 MG PO TABS
4.0000 mg | ORAL_TABLET | Freq: Two times a day (BID) | ORAL | Status: AC
Start: 2024-05-05 — End: 2024-05-07
  Administered 2024-05-05 – 2024-05-07 (×4): 4 mg via ORAL
  Filled 2024-05-05 (×4): qty 1

## 2024-05-05 MED ORDER — LIDOCAINE 2% (20 MG/ML) 5 ML SYRINGE
INTRAMUSCULAR | Status: DC | PRN
  Administered 2024-05-05: 60 mg via INTRAVENOUS

## 2024-05-05 MED ORDER — DEXAMETHASONE SOD PHOSPHATE PF 10 MG/ML IJ SOLN
INTRAMUSCULAR | Status: DC | PRN
  Administered 2024-05-05: 10 mg via INTRAVENOUS

## 2024-05-05 MED ORDER — LACTATED RINGERS IV SOLN: INTRAVENOUS | Status: DC

## 2024-05-05 MED ORDER — KETAMINE HCL 50 MG/5ML IJ SOSY
PREFILLED_SYRINGE | INTRAMUSCULAR | Status: AC
  Filled 2024-05-05: qty 5

## 2024-05-05 MED ORDER — HYDROMORPHONE 1 MG/ML IV SOLN: INTRAVENOUS | Status: DC

## 2024-05-05 MED ORDER — SODIUM CHLORIDE 0.9 % IV SOLN: INTRAVENOUS | Status: DC | PRN

## 2024-05-05 MED ORDER — PHENYLEPHRINE 80 MCG/ML (10ML) SYRINGE FOR IV PUSH (FOR BLOOD PRESSURE SUPPORT)
PREFILLED_SYRINGE | INTRAVENOUS | Status: DC | PRN
  Administered 2024-05-05 (×4): 80 ug via INTRAVENOUS

## 2024-05-05 MED ORDER — DEXAMETHASONE 0.5 MG PO TABS
1.0000 mg | ORAL_TABLET | Freq: Every day | ORAL | Status: AC
  Administered 2024-05-12 – 2024-05-13 (×2): 1 mg via ORAL
  Filled 2024-05-05 (×2): qty 2

## 2024-05-05 MED ORDER — ACETAMINOPHEN 10 MG/ML IV SOLN
INTRAVENOUS | Status: DC | PRN
  Administered 2024-05-05: 1000 mg via INTRAVENOUS

## 2024-05-05 MED ORDER — HYDROMORPHONE HCL 1 MG/ML IJ SOLN
INTRAMUSCULAR | Status: AC
  Filled 2024-05-05: qty 1

## 2024-05-05 MED ORDER — OXYCODONE HCL 5 MG/5ML PO SOLN: 5.0000 mg | Freq: Once | ORAL | Status: DC | PRN

## 2024-05-05 MED ORDER — PHENYLEPHRINE 80 MCG/ML (10ML) SYRINGE FOR IV PUSH (FOR BLOOD PRESSURE SUPPORT)
PREFILLED_SYRINGE | INTRAVENOUS | Status: AC
  Filled 2024-05-05: qty 10

## 2024-05-05 MED ORDER — SUCCINYLCHOLINE CHLORIDE 200 MG/10ML IV SOSY
PREFILLED_SYRINGE | INTRAVENOUS | Status: DC | PRN
  Administered 2024-05-05: 140 mg via INTRAVENOUS

## 2024-05-05 MED ORDER — THROMBIN 20000 UNITS EX SOLR
CUTANEOUS | Status: AC
Start: 2024-05-05 — End: 2024-05-05
  Filled 2024-05-05: qty 20000

## 2024-05-05 MED ORDER — BACITRACIN ZINC 500 UNIT/GM EX OINT
TOPICAL_OINTMENT | CUTANEOUS | Status: DC | PRN
  Administered 2024-05-05: 1 via TOPICAL

## 2024-05-05 MED ORDER — BUPIVACAINE-EPINEPHRINE (PF) 0.5% -1:200000 IJ SOLN
INTRAMUSCULAR | Status: AC
Start: 2024-05-05 — End: 2024-05-05
  Filled 2024-05-05: qty 30

## 2024-05-05 MED ORDER — DEXAMETHASONE 4 MG PO TABS
2.0000 mg | ORAL_TABLET | Freq: Every day | ORAL | Status: AC
  Administered 2024-05-10 – 2024-05-11 (×2): 2 mg via ORAL
  Filled 2024-05-05 (×2): qty 1

## 2024-05-05 MED ORDER — PROPOFOL 10 MG/ML IV BOLUS
INTRAVENOUS | Status: DC | PRN
  Administered 2024-05-05: 50 mg via INTRAVENOUS
  Administered 2024-05-05: 150 ug/kg/min via INTRAVENOUS
  Administered 2024-05-05: 200 mg via INTRAVENOUS
  Administered 2024-05-05: 50 mg via INTRAVENOUS
  Administered 2024-05-05: 150 ug/kg/min via INTRAVENOUS

## 2024-05-05 MED ORDER — ALBUMIN HUMAN 5 % IV SOLN: INTRAVENOUS | Status: DC | PRN

## 2024-05-05 MED ORDER — ONDANSETRON HCL 4 MG/2ML IJ SOLN
INTRAMUSCULAR | Status: AC
  Filled 2024-05-05: qty 2

## 2024-05-05 MED ORDER — SENNOSIDES-DOCUSATE SODIUM 8.6-50 MG PO TABS
1.0000 | ORAL_TABLET | Freq: Two times a day (BID) | ORAL | Status: DC
  Administered 2024-05-05 – 2024-05-10 (×10): 1 via ORAL
  Filled 2024-05-05 (×10): qty 1

## 2024-05-05 MED ORDER — SODIUM CHLORIDE 0.9% FLUSH: 9.0000 mL | INTRAVENOUS | Status: DC | PRN

## 2024-05-05 MED ORDER — DIPHENHYDRAMINE HCL 12.5 MG/5ML PO ELIX
12.5000 mg | ORAL_SOLUTION | Freq: Four times a day (QID) | ORAL | Status: DC | PRN
  Administered 2024-05-05: 12.5 mg via ORAL
  Filled 2024-05-05: qty 5

## 2024-05-05 MED ORDER — FENTANYL CITRATE (PF) 250 MCG/5ML IJ SOLN
INTRAMUSCULAR | Status: DC | PRN
Start: 2024-05-05 — End: 2024-05-05
  Administered 2024-05-05: 150 ug via INTRAVENOUS
  Administered 2024-05-05: 100 ug via INTRAVENOUS

## 2024-05-05 MED ORDER — FENTANYL CITRATE (PF) 250 MCG/5ML IJ SOLN
INTRAMUSCULAR | Status: AC
  Filled 2024-05-05: qty 5

## 2024-05-05 MED ORDER — LIDOCAINE 2% (20 MG/ML) 5 ML SYRINGE
INTRAMUSCULAR | Status: AC
  Filled 2024-05-05: qty 5

## 2024-05-05 MED ORDER — DIPHENHYDRAMINE HCL 50 MG/ML IJ SOLN
12.5000 mg | Freq: Four times a day (QID) | INTRAMUSCULAR | Status: DC | PRN
Start: 2024-05-05 — End: 2024-05-08
  Filled 2024-05-05: qty 1

## 2024-05-05 MED ORDER — THROMBIN 5000 UNITS EX KIT
PACK | CUTANEOUS | Status: AC
  Filled 2024-05-05: qty 3

## 2024-05-05 MED ORDER — PHENYLEPHRINE HCL-NACL 20-0.9 MG/250ML-% IV SOLN
INTRAVENOUS | Status: DC | PRN
  Administered 2024-05-05: 40 ug/min via INTRAVENOUS

## 2024-05-05 MED ORDER — PROPOFOL 10 MG/ML IV BOLUS
INTRAVENOUS | Status: AC
Start: 2024-05-05 — End: 2024-05-05
  Filled 2024-05-05: qty 20

## 2024-05-05 MED ORDER — DEXAMETHASONE 4 MG PO TABS
4.0000 mg | ORAL_TABLET | Freq: Every day | ORAL | Status: AC
  Administered 2024-05-08 – 2024-05-09 (×2): 4 mg via ORAL
  Filled 2024-05-05 (×2): qty 1

## 2024-05-05 MED ORDER — ORAL CARE MOUTH RINSE: 15.0000 mL | Freq: Once | OROMUCOSAL | Status: DC

## 2024-05-05 MED ORDER — KETAMINE HCL 10 MG/ML IJ SOLN
INTRAMUSCULAR | Status: DC | PRN
  Administered 2024-05-05 (×2): 10 mg via INTRAVENOUS
  Administered 2024-05-05: 30 mg via INTRAVENOUS

## 2024-05-05 MED ORDER — BACITRACIN ZINC 500 UNIT/GM EX OINT
TOPICAL_OINTMENT | CUTANEOUS | Status: AC
  Filled 2024-05-05: qty 28.35

## 2024-05-05 MED ORDER — BUPIVACAINE-EPINEPHRINE 0.5% -1:200000 IJ SOLN
INTRAMUSCULAR | Status: DC | PRN
  Administered 2024-05-05: 10 mL

## 2024-05-05 MED ORDER — FENTANYL CITRATE (PF) 100 MCG/2ML IJ SOLN
25.0000 ug | INTRAMUSCULAR | Status: DC | PRN
  Administered 2024-05-05 (×3): 50 ug via INTRAVENOUS

## 2024-05-05 MED ORDER — CEFAZOLIN SODIUM 1 G IJ SOLR
INTRAMUSCULAR | Status: AC
  Filled 2024-05-05: qty 30

## 2024-05-05 MED ORDER — CYCLOBENZAPRINE HCL 10 MG PO TABS
10.0000 mg | ORAL_TABLET | Freq: Three times a day (TID) | ORAL | Status: DC
  Administered 2024-05-05 – 2024-05-16 (×32): 10 mg via ORAL
  Filled 2024-05-05 (×32): qty 1

## 2024-05-05 MED ORDER — MIDAZOLAM HCL (PF) 2 MG/2ML IJ SOLN
INTRAMUSCULAR | Status: DC | PRN
  Administered 2024-05-05: 2 mg via INTRAVENOUS

## 2024-05-05 MED ORDER — SUFENTANIL CITRATE 50 MCG/ML IV SOLN
0.2500 ug/kg/h | INTRAVENOUS | Status: AC
  Administered 2024-05-05: .25 ug/kg/h via INTRAVENOUS
  Filled 2024-05-05: qty 1

## 2024-05-05 MED ORDER — MUPIROCIN 2 % EX OINT
1.0000 | TOPICAL_OINTMENT | Freq: Two times a day (BID) | CUTANEOUS | Status: AC
  Administered 2024-05-05 – 2024-05-09 (×10): 1 via NASAL
  Filled 2024-05-05 (×2): qty 22

## 2024-05-05 MED ORDER — CHLORHEXIDINE GLUCONATE 0.12 % MT SOLN
15.0000 mL | Freq: Once | OROMUCOSAL | Status: DC
  Filled 2024-05-05: qty 15

## 2024-05-05 MED ORDER — CHLORHEXIDINE GLUCONATE CLOTH 2 % EX PADS
6.0000 | MEDICATED_PAD | Freq: Every day | CUTANEOUS | Status: DC
  Administered 2024-05-06 – 2024-05-09 (×3): 6 via TOPICAL

## 2024-05-05 MED ORDER — SUFENTANIL CITRATE 50 MCG/ML IV SOLN
0.2500 ug/kg/h | INTRAVENOUS | Status: DC
  Filled 2024-05-05 (×2): qty 1

## 2024-05-05 MED ORDER — OXYCODONE HCL 5 MG PO TABS: 5.0000 mg | ORAL_TABLET | Freq: Once | ORAL | Status: DC | PRN

## 2024-05-05 SURGICAL SUPPLY — 75 items
11341435 (Screw) IMPLANT
BAG COUNTER SPONGE SURGICOUNT (BAG) ×1 IMPLANT
BAND RUBBER #18 3X1/16 STRL (MISCELLANEOUS) ×1 IMPLANT
BLADE CLIPPER SURG (BLADE) IMPLANT
BUR 14 MATCH 3 (BUR) IMPLANT
BUR 15 MATCH 2.2 (BUR) IMPLANT
BUR MATCHSTICK NEURO 3.0 LAGG (BURR) ×1 IMPLANT
BUR MR8 14 BALL 5 (BUR) IMPLANT
CANISTER SUCTION 3000ML PPV (SUCTIONS) ×1 IMPLANT
CAP LOCKING THREADED (Cap) IMPLANT
DRAIN CHANNEL 15F RND FF W/TCR (WOUND CARE) IMPLANT
DRAPE C-ARM 35X43 STRL (DRAPES) IMPLANT
DRAPE C-ARM 42X72 X-RAY (DRAPES) ×2 IMPLANT
DRAPE SHEET LG 3/4 BI-LAMINATE (DRAPES) ×4 IMPLANT
DRAPE SURG 17X23 STRL (DRAPES) ×1 IMPLANT
DRAPE UTILITY XL STRL (DRAPES) ×1 IMPLANT
DRSG VAC GRANUFOAM LG (GAUZE/BANDAGES/DRESSINGS) IMPLANT
DURAPREP 26ML APPLICATOR (WOUND CARE) ×4 IMPLANT
ELECTRODE BLADE INSULATED 4IN (ELECTROSURGICAL) ×1 IMPLANT
ELECTRODE REM PT RTRN 9FT ADLT (ELECTROSURGICAL) ×1 IMPLANT
EVACUATOR SILICONE 100CC (DRAIN) IMPLANT
FEE COVERAGE SUPPORT O-ARM (MISCELLANEOUS) ×1 IMPLANT
FEE INTRAOP CADWELL SUPPLY NCS (MISCELLANEOUS) IMPLANT
FEE INTRAOP MONITOR IMPULS NCS (MISCELLANEOUS) IMPLANT
FIBER BONE ALLOSYNC EXPAND 2.5 (Bone Implant) IMPLANT
GAUZE 4X4 16PLY ~~LOC~~+RFID DBL (SPONGE) IMPLANT
GAUZE SPONGE 4X4 12PLY STRL (GAUZE/BANDAGES/DRESSINGS) IMPLANT
GLOVE BIOGEL PI IND STRL 8 (GLOVE) IMPLANT
GLOVE ECLIPSE 8.0 STRL XLNG CF (GLOVE) IMPLANT
GLOVE INDICATOR 8.5 STRL (GLOVE) ×4 IMPLANT
GLOVE SURG SS PI 7.0 STRL IVOR (GLOVE) IMPLANT
GLOVE SURG SYN 8.0 PF PI (GLOVE) ×2 IMPLANT
GOWN STRL REUS W/ TWL LRG LVL3 (GOWN DISPOSABLE) IMPLANT
GOWN STRL REUS W/ TWL XL LVL3 (GOWN DISPOSABLE) ×1 IMPLANT
GOWN STRL REUS W/TWL 2XL LVL3 (GOWN DISPOSABLE) IMPLANT
GRAFT TRIN ELITE MED MUSC TRAN (Graft) IMPLANT
HEMOSTAT POWDER KIT SURGIFOAM (HEMOSTASIS) ×1 IMPLANT
KIT BASIN OR (CUSTOM PROCEDURE TRAY) ×1 IMPLANT
KIT POSITIONER JACKSON TABLE (MISCELLANEOUS) ×1 IMPLANT
KIT TURNOVER KIT B (KITS) ×1 IMPLANT
MARKER SPHERE PSV REFLC NDI (MISCELLANEOUS) ×5 IMPLANT
NDL HYPO 18GX1.5 BLUNT FILL (NEEDLE) IMPLANT
NDL HYPO 22X1.5 SAFETY MO (MISCELLANEOUS) ×1 IMPLANT
NEEDLE HYPO 18GX1.5 BLUNT FILL (NEEDLE) IMPLANT
NEEDLE HYPO 22X1.5 SAFETY MO (MISCELLANEOUS) ×1 IMPLANT
PACK LAMINECTOMY NEURO (CUSTOM PROCEDURE TRAY) ×1 IMPLANT
PACK UNIVERSAL I (CUSTOM PROCEDURE TRAY) ×1 IMPLANT
PAD ARMBOARD POSITIONER FOAM (MISCELLANEOUS) ×3 IMPLANT
PATTIES SURGICAL .5 X.5 (GAUZE/BANDAGES/DRESSINGS) IMPLANT
PATTIES SURGICAL 1X1 (DISPOSABLE) IMPLANT
PIN MAYFIELD SKULL DISP (PIN) ×1 IMPLANT
PUTTY DBM INSTAFILL CART 5CC (Putty) IMPLANT
ROD 70MM SPINAL (Rod) IMPLANT
SCREW PA THRD CREO TULIP 5.5X4 (Head) IMPLANT
SCREW SPINAL CREO ONE 5X3 ROB (Screw) IMPLANT
SCREW SPINAL CREO ONE 5X30 (Screw) IMPLANT
SEALER BIPOLAR AQUA 6.0 (INSTRUMENTS) ×1 IMPLANT
SOLN 0.9% NACL POUR BTL 1000ML (IV SOLUTION) ×1 IMPLANT
SOLN STERILE WATER BTL 1000 ML (IV SOLUTION) ×1 IMPLANT
SOLUTION IRRIG SURGIPHOR (IV SOLUTION) IMPLANT
SPACER CERV TI FORT 18-23X12 (Spacer) IMPLANT
SPACER LOWER FORTIFY 12X14 0D (Plate) IMPLANT
SPACER UPPER FORTIFY 12X14 3.5 (Plate) IMPLANT
SPIKE FLUID TRANSFER (MISCELLANEOUS) ×1 IMPLANT
SPONGE SURGIFOAM ABS GEL 100 (HEMOSTASIS) IMPLANT
SPONGE T-LAP 4X18 ~~LOC~~+RFID (SPONGE) IMPLANT
SUT MNCRL AB 4-0 PS2 18 (SUTURE) ×1 IMPLANT
SUT SILK 2 0 TIES 10X30 (SUTURE) IMPLANT
SUT VIC AB 1 CT1 18XBRD ANBCTR (SUTURE) ×1 IMPLANT
SUT VIC AB 2-0 CT1 18 (SUTURE) ×1 IMPLANT
SUT VIC AB 3-0 SH 8-18 (SUTURE) ×1 IMPLANT
SYR 3ML LL SCALE MARK (SYRINGE) ×1 IMPLANT
TOWEL GREEN STERILE (TOWEL DISPOSABLE) ×1 IMPLANT
TOWEL GREEN STERILE FF (TOWEL DISPOSABLE) ×1 IMPLANT
TRAY FOLEY MTR SLVR 14FR STAT (SET/KITS/TRAYS/PACK) IMPLANT

## 2024-05-05 NOTE — Progress Notes (Signed)
 Heparin was stopped as per note

## 2024-05-05 NOTE — Transfer of Care (Signed)
 Immediate Anesthesia Transfer of Care Note  Patient: Joseph [name]  Procedure(s) Performed: POSTERIOR CERVICAL FUSION CERVICAL SEVEN-THORACIC ONE, THORACIC ONE-THORACIC TWO, THORACIC TWO-THORACIC THREE ,FOR RESECTION OF TUMOR THORACIC TWO CORPECTOMY APPLICATION OF O-ARM  Patient Location: PACU  Anesthesia Type:General  Level of Consciousness: drowsy  Airway & Oxygen Therapy: Patient connected to face mask oxygen  Post-op Assessment: Report given to RN and Post -op Vital signs reviewed and stable  Post vital signs: Reviewed and stable  Last Vitals:  Vitals Value Taken Time  BP 116/52 05/05/24 15:31  Temp    Pulse 96 05/05/24 15:34  Resp 17 05/05/24 15:34  SpO2 99 % 05/05/24 15:34  Vitals shown include unfiled device data.  Last Pain:  Vitals:   05/05/24 0716  TempSrc:   PainSc: 5       Patients Stated Pain Goal: 0 (05/02/24 0510)  Complications: There were no known notable events for this encounter.

## 2024-05-05 NOTE — Anesthesia Preprocedure Evaluation (Addendum)
 Anesthesia Evaluation  Patient identified by MRN, date of birth, ID band Patient awake    Reviewed: Allergy & Precautions, NPO status , Patient's Chart, lab work & pertinent test results  Airway Mallampati: II  TM Distance: >3 FB Neck ROM: Full    Dental no notable dental hx.    Pulmonary asthma , Current Smoker and Patient abstained from smoking.   Pulmonary exam normal        Cardiovascular pulmonary hypertensionNormal cardiovascular exam     Neuro/Psych  PSYCHIATRIC DISORDERS Anxiety Depression       GI/Hepatic ,,,(+)     substance abuse    Endo/Other    Class 3 obesity  Renal/GU      Musculoskeletal   Abdominal  (+) + obese  Peds  Hematology  (+) Blood dyscrasia, anemia   Anesthesia Other Findings CERVICAL SPINE TUMOR  Reproductive/Obstetrics                              Anesthesia Physical Anesthesia Plan  ASA: 3  Anesthesia Plan: General   Post-op Pain Management: Sufentanil infusion   Induction: Intravenous  PONV Risk Score and Plan: 1 and Ondansetron , Dexamethasone, Midazolam , Treatment may vary due to age or medical condition and Propofol  infusion  Airway Management Planned: Oral ETT  Additional Equipment: Arterial line  Intra-op Plan:   Post-operative Plan: Extubation in OR  Informed Consent: I have reviewed the patients History and Physical, chart, labs and discussed the procedure including the risks, benefits and alternatives for the proposed anesthesia with the patient or authorized representative who has indicated his/her understanding and acceptance.     Dental advisory given  Plan Discussed with: CRNA  Anesthesia Plan Comments:          Anesthesia Quick Evaluation

## 2024-05-05 NOTE — Progress Notes (Signed)
   05/05/24 1725  Vitals  Temp 98.4 F (36.9 C)  Temp Source Oral  BP (!) 143/70  MAP (mmHg) 93  BP Location Right Leg  BP Method Manual  Patient Position (if appropriate) Lying  Pulse Rate 74  Pulse Rate Source Monitor  ECG Heart Rate 75  Resp 14  Level of Consciousness  Level of Consciousness Alert  MEWS COLOR  MEWS Score Color Green  Oxygen Therapy  SpO2 94 %  O2 Device Room Air  MEWS Score  MEWS Temp 0  MEWS Systolic 0  MEWS Pulse 0  MEWS RR 0  MEWS LOC 0  MEWS Score 0   Pt back on unit from PACU. Alert and oriented x4, VSS, has JP drain and wound vac to upper back. MD notified of pt arrival.

## 2024-05-05 NOTE — Progress Notes (Addendum)
 Pt is s/p T1-3 posterior fusion T2 corpectomy resection of metastasis. Quite hemorrhagic tumor  Plan: JP drain bulb suction Postop films pending MRI Tspine Activity as tolerated Diet as tolerated Ok for Dvt ppx on 11/15 Ok for therapeutic anticoagulation on 11/20

## 2024-05-05 NOTE — Progress Notes (Signed)
 Pt left to OR accompanied by transport

## 2024-05-05 NOTE — Progress Notes (Addendum)
 This RN set up PCA pump with assistance from [employer], [id number]. PCA pump started and pt educated. PCA key was stored back into pyxis by Porter, RN.

## 2024-05-05 NOTE — Progress Notes (Signed)
 0715 Pt is in short stay, Unable to flush PICC line. One line has blood, both lines are clogged upon flushing. IV team consult placed stat.

## 2024-05-05 NOTE — Anesthesia Procedure Notes (Signed)
 Procedure Name: Intubation Date/Time: 05/05/2024 8:29 AM  Performed by: Christopher Comings, CRNAPre-anesthesia Checklist: Patient identified, Emergency Drugs available, Suction available and Patient being monitored Patient Re-evaluated:Patient Re-evaluated prior to induction Oxygen Delivery Method: Circle system utilized Preoxygenation: Pre-oxygenation with 100% oxygen Induction Type: IV induction Ventilation: Mask ventilation without difficulty Laryngoscope Size: Mac and 4 Grade View: Grade I Tube type: Oral Tube size: 7.5 mm Number of attempts: 1 Airway Equipment and Method: Stylet and Oral airway Placement Confirmation: ETT inserted through vocal cords under direct vision, positive ETCO2 and breath sounds checked- equal and bilateral Secured at: 23 cm Tube secured with: Tape Dental Injury: Teeth and Oropharynx as per pre-operative assessment

## 2024-05-05 NOTE — Anesthesia Procedure Notes (Signed)
 Arterial Line Insertion Start/End11/13/2025 8:50 AM, 05/05/2024 8:58 AM Performed by: Merla Almarie HERO, DO, anesthesiologist  Patient location: OR. Preanesthetic checklist: patient identified, IV checked, site marked, risks and benefits discussed, surgical consent, monitors and equipment checked, pre-op evaluation, timeout performed and anesthesia consent Lidocaine  1% used for infiltration Right, radial was placed Catheter size: 20 G Hand hygiene performed  and maximum sterile barriers used   Attempts: 1 Procedure performed using ultrasound to evaluate access site. Ultrasound Notes:relevant anatomy identified, ultrasound used to visualize needle entry, vessel patent under ultrasound and image(s) printed for medical record. Following insertion, dressing applied, line sutured and Biopatch. Post procedure assessment: normal and unchanged  Post procedure complications: local hematoma, unsuccessful attempts and second provider assisted. Patient tolerated the procedure well with no immediate complications. Additional procedure comments: Arterial line initially placed in preop but lost in the OR. Replaced with 20G brachial catheter radially under ultrasound guidance and sutured in place.SABRA

## 2024-05-05 NOTE — Progress Notes (Signed)
 Arrived to short stay, pt being taken to procedure. LUE PICC occluded. Pt reports difficulty with the line since 11/10. Pt has PIV access by CRNA for procedure. Will assess PICC post-procedure.

## 2024-05-05 NOTE — Progress Notes (Signed)
 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 Underwent posterior T1-3 instrumentation fusion T2 corpectomy for resection of tumor (11/13)  LOS: 3 days    Plan: Decadron wean in place F/u surgical pathology JP drain to bulb suction Wound vac on. Wound consult placed for routine changes Activity as tolerated. No brace needed Postop films pending MRI Tspine pending Diet as tolerated Ok for DVT ppx on 11/15 Please hold therapeutic anticoagulation until 11/20 Will plan for removal of sutures in 3 weeks   Subjective: Expected postop pain  Objective: Vital signs in last 24 hours: Temp:  [98.2 F (36.8 C)-99 F (37.2 C)] 98.4 F (36.9 C) (11/13 1725) Pulse Rate:  [74-99] 74 (11/13 1725) Resp:  [9-18] 14 (11/13 1725) BP: (112-161)/(52-82) 143/70 (11/13 1725) SpO2:  [93 %-99 %] 94 % (11/13 1725) Arterial Line BP: (103-134)/(65-73) 103/71 (11/13 1700) Weight:  [143.5 kg] 143.5 kg (11/13 0641)  Intake/Output from previous day: 11/12 0701 - 11/13 0700 In: -  Out: 3250 [Urine:3250] Intake/Output this shift: Total I/O In: 4550 [I.V.:3200; Blood:600; IV Piggyback:750] Out: 2275 [Urine:725; Drains:50; Blood:1500]  Exam: RUE: 5/5 grip LUE: 5/5 grip BLE: 3/5 IP. 4/5 quad, ham, DF, EHL, PF Subjective sensory level from mid thoracic down bilaterally. Still has sensation, just decreased Wound vac covering incision  Lab Results: Recent Labs    05/04/24 0500 05/05/24 0417  WBC 10.1 11.6*  HGB 12.5* 12.8*  HCT 37.2* 38.3*  PLT 119* 112*   BMET Recent Labs    05/04/24 0910  NA 137  K 4.1  CL 101  CO2 25  GLUCOSE 112*  BUN 13  CREATININE 0.93  CALCIUM 9.0       Joseph [name] 05/05/2024, 5:55 PM]

## 2024-05-05 NOTE — Progress Notes (Signed)
 PROGRESS NOTE   Joseph [name]  FMW:[id number]    DOB: Dec 09, 1980    DOA: 04/27/2024  PCP: Patient, No Pcp Per   I have briefly reviewed patients previous medical records in [facility].   Brief Hospital Course:  43 year old gentleman with no significant medical history presented to the hospital with about 4 days of back pain and difficulty walking, he was having some back pain issues for about 2 weeks.  CT scan of the lumbar spine was unrevealing.  CTA chest PE protocol showed right upper lobe segmental PE. He was started on Eliquis. Patient continued to have difficulty with mobility.  Multiple investigations were done.  MRI of the thoracic spine ultimately showed pathological T2 compression fracture. Thyroid ultrasound showed large left thyroid nodule.  He was transferred to [facility] from [place] for Neurosurgery.  IR plans biopsy of thyroid nodule?  11/12.  Transitioned to IV heparin for procedures.       Assessment & Plan:   Back pain and difficulty walking T2 pathological fracture, metastatic malignancy suspected, with compressive myelopathy C6 lucent lesion, metastatic disease suspected Left thyroid enlargement Pulmonary nodules Neurosurgery consulted. Decadron 4 mg IV every 12 hours. Dr. Renato Maha, [facility] MD discussed with IR regarding thyroid biopsy on 11/11, timing unclear,?  For today. Therapies to reassess post surgery.  PT on 11/7 recommended home health PT and OT on 11/9 recommended AIR. Multimodality pain control Plan to consult medical oncology pending pathology results. 11/13: S/p posterior T1-3 instrumentation fusion T2 corpectomy for resection of tumor.  Discussed with Dr. Darnella.  Extensive procedure.  Hemorrhagic tumor.  1300 mL EBL.  Got 2 units of PRBC across surgery.  Recommends holding therapeutic anticoagulation for a week postop (start back 11/20) and okay to start DVT prophylaxis on 11/15.  Right upper lobe segmental PE Pulmonary artery  hypertension Was on IV heparin bridge pending procedures prior to transitioning back to Eliquis.  See anticoagulation discussion above. Given possible cancer diagnosis, anticoagulation will be prolonged.  Thrombocytopenia Mild and stable,?  Related to IV heparin.  Monitor closely.  Follow CBC and INR in a.m. given extent of blood loss during surgery and blood transfusions.    Body mass index is 46.72 kg/m./Very morbid obesity class III Complicates care.  Outpatient follow-up.   DVT prophylaxis: Place and maintain sequential compression device Start: 05/01/24 1226     Code Status: Full Code:  Family Communication: None at bedside. Disposition:  Status is: Inpatient Remains inpatient appropriate because: Pending above procedures.     Consultants:   Neurosurgery Neurology Interventional radiology  Procedures:   As above  Subjective:  Patient was already in the OR by the time I arrived today.  Made multiple visits to the floor but patient was still in the OR until a short while ago.  Came by and visited patient.  Patient expectedly somnolent but arousable.  Reports significant mid upper back pain.  Objective:   Vitals:   05/05/24 1630 05/05/24 1645 05/05/24 1700 05/05/24 1725  BP: 135/67 138/65 138/76 (!) 143/70  Pulse: 82 82 77 74  Resp: 10 14 15 14   Temp:    98.4 F (36.9 C)  TempSrc:    Oral  SpO2: 98% 96% 97% 94%  Weight:      Height:        General exam: Young male, moderately built and morbidly obese appeared somewhat uncomfortable due to pain. Respiratory system: Clear to auscultation. Respiratory effort normal. Cardiovascular system: S1 &  S2 heard, RRR. No JVD, murmurs, rubs, gallops or clicks. No pedal edema.  Telemetry personally reviewed at bedside: Sinus rhythm.  Stable. Gastrointestinal system: Abdomen is nondistended, soft and nontender. No organomegaly or masses felt. Normal bowel sounds heard. Central nervous system: Somnolent but easily arousable and  oriented x 2. No focal neurological deficits. Extremities: Symmetric 4 x 5 power at least in all limbs. Skin: No rashes, lesions or ulcers Psychiatry: Judgement and insight appear normal. Mood & affect appropriate.     Data Reviewed:   I have personally reviewed following labs and imaging studies   CBC: Recent Labs  Lab 05/03/24 0048 05/04/24 0500 05/05/24 0417  WBC 10.9* 10.1 11.6*  HGB 11.7* 12.5* 12.8*  HCT 35.7* 37.2* 38.3*  MCV 89.7 89.4 89.5  PLT 90* 119* 112*    Basic Metabolic Panel: Recent Labs  Lab 04/29/24 0516 05/04/24 0910  NA 135 137  K 4.3 4.1  CL 101 101  CO2 28 25  GLUCOSE 137* 112*  BUN 11 13  CREATININE 0.87 0.93  CALCIUM 9.4 9.0    Liver Function Tests: No results for input(s): AST, ALT, ALKPHOS, BILITOT, PROT, ALBUMIN in the last 168 hours.   CBG: Recent Labs  Lab 05/02/24 0159  GLUCAP 136*    Microbiology Studies:   Recent Results (from the past 240 hours)  Surgical pcr screen     Status: Abnormal   Collection Time: 05/04/24  6:45 PM   Specimen: Nasal Mucosa; Nasal Swab  Result Value Ref Range Status   MRSA, PCR NEGATIVE NEGATIVE Final   Staphylococcus aureus POSITIVE (A) NEGATIVE Final    Comment: (NOTE) The Xpert SA Assay (FDA approved for NASAL specimens in patients 28 years of age and older), is one component of a comprehensive surveillance program. It is not intended to diagnose infection nor to guide or monitor treatment. Performed at [facility], [address] [place], [place] [address]     Radiology Studies:  DG Cervical Spine 2 or 3 views Result Date: 05/05/2024 CLINICAL DATA:  Elective surgery. EXAM: CERVICAL SPINE - 2-3 VIEW COMPARISON:  Preoperative imaging. FINDINGS: Six fluoroscopic spot views of the lower cervical and upper thoracic spine submitted from the operating room. Imaging obtained during posterior fusion C7 through T4 with T2 corpectomy. Fluoroscopy time 52.1 seconds. Dose 37.89  mGy. IMPRESSION: Intraoperative fluoroscopy during cervicothoracic surgery. Electronically Signed   By: Andrea Gasman M.D.   On: 05/05/2024 16:38   DG C-Arm 1-60 Min-No Report Result Date: 05/05/2024 Fluoroscopy was utilized by the requesting physician.  No radiographic interpretation.   DG C-Arm 1-60 Min-No Report Result Date: 05/05/2024 Fluoroscopy was utilized by the requesting physician.  No radiographic interpretation.   DG C-Arm 1-60 Min-No Report Result Date: 05/05/2024 Fluoroscopy was utilized by the requesting physician.  No radiographic interpretation.   DG C-Arm 1-60 Min-No Report Result Date: 05/05/2024 Fluoroscopy was utilized by the requesting physician.  No radiographic interpretation.   DG C-Arm 1-60 Min-No Report Result Date: 05/05/2024 Fluoroscopy was utilized by the requesting physician.  No radiographic interpretation.   DG C-Arm 1-60 Min-No Report Result Date: 05/05/2024 Fluoroscopy was utilized by the requesting physician.  No radiographic interpretation.   DG O-ARM IMAGE ONLY/NO REPORT Result Date: 05/05/2024 There is no Radiologist interpretation  for this exam.  US  THYROID FNA EACH NODULE Result Date: 05/04/2024 INDICATION: [id number] Thyroid mass [id number] Indeterminate thyroid nodule EXAM: ULTRASOUND GUIDED FINE NEEDLE ASPIRATION OF INDETERMINATE THYROID NODULE COMPARISON:  U/S thyroid on 04/29/2024  CT CAP, 05/02/2024. MEDICATIONS: 4 cc of 1% lidocaine  COMPLICATIONS: None immediate. TECHNIQUE: Informed written consent was obtained from the patient after a discussion of the risks, benefits and alternatives to treatment. Questions regarding the procedure were encouraged and answered. A timeout was performed prior to the initiation of the procedure. Pre-procedural ultrasound scanning demonstrated unchanged size and appearance of the indeterminate nodule within the left lobe of the thyroid. The procedure was planned. The neck was prepped in the usual sterile  fashion, and a sterile drape was applied covering the operative field. A timeout was performed prior to the initiation of the procedure. Local anesthesia was provided with 1% lidocaine . Under direct ultrasound guidance, 5 FNA biopsies were performed of the left thyroid nodule with a 25 gauge needle. Multiple ultrasound images were saved for procedural documentation purposes. The samples were prepared and submitted to pathology. Limited post procedural scanning was negative for hematoma or additional complication. Dressings were placed. The patient tolerated the above procedures procedure well without immediate postprocedural complication. FINDINGS: Nodule reference number based on prior diagnostic ultrasound: 1 Maximum size: 8.2 cm Location: Left; occupies majority of lobe ACR TI-RADS risk category: TR4 (4-6 points) Reason for biopsy: meets ACR TI-RADS criteria Ultrasound imaging confirms appropriate placement of the needles within the thyroid nodule. IMPRESSION: Successful ultrasound guided FNA biopsy of a dominant 8.2 cm LEFT TR-4 thyroid nodule Procedure performed by Carlin Griffon, PA-C under direct supervision of Thom Hall, MD Electronically Signed   By: Thom Hall M.D.   On: 05/04/2024 17:03    Scheduled Meds:    acetaminophen   1,000 mg Oral Q8H   Chlorhexidine  Gluconate Cloth  6 each Topical Daily   Chlorhexidine  Gluconate Cloth  6 each Topical Daily   cyclobenzaprine  10 mg Oral TID   dexamethasone  4 mg Oral Q12H   Followed by   NOREEN ON 05/08/2024] dexamethasone  4 mg Oral Daily   Followed by   NOREEN ON 05/10/2024] dexamethasone  2 mg Oral Daily   Followed by   NOREEN ON 05/12/2024] dexamethasone  1 mg Oral Daily   HYDROmorphone    Intravenous Q4H   methocarbamol  500 mg Oral TID   mupirocin ointment  1 Application Nasal BID   senna-docusate  1 tablet Oral BID   sodium chloride  flush  10-40 mL Intracatheter Q12H   sodium chloride  flush  3 mL Intravenous Q12H    Continuous  Infusions:       LOS: 3 days     Trenda Mar, MD,  FACP, [facility], [facility], [facility]   [facility] [place]      To contact the attending provider between 7A-7P or the covering provider during after hours 7P-7A, please log into the web site [email or web address] and access using universal [place] password for that web site. If you do not have the password, please call the hospital operator.  05/05/2024, 6:09 PM

## 2024-05-05 NOTE — Op Note (Addendum)
 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 male with history of substance abuse who presented with 1 week of rapidly progressive thoracic myelopathy.  Imaging studies showed T2 pathologic fracture with associated instability and high-grade spinal cord compression.  Other imaging revealed metastatic lesions within the lungs, a thyroid nodule, and pulmonary emboli requiring anticoagulation.  We did not have histology.  However, given the instability and complete destruction of the anterior middle columns of T2, I felt that a corpectomy and placement of corpectomy cage with pedicle screws was necessary.  In the process, we would perform a spinal cord decompression.  I explained the risk of bleeding, infection, incomplete tumor resection, CSF leak, spinal cord injury, nerve injury, general anesthesia risk, mortality, damage nearby organs, hardware failure.  He verbalized understanding and wished to proceed  PROCEDURE IN DETAIL: The patient was brought to the operating room. After induction of general anesthesia, the patient was positioned on the operative table in the prone position in a [place] head holder. All pressure points were meticulously padded. Skin incision was then marked out with fluoroscopy and prepped and draped in the usual sterile fashion.  I performed a midline incision from just above the T1 pedicle to just below the T3 pedicle.  I performed a midline subperiosteal dissection exposing the  T1, T2, and T3 lamina.  I then placed a spinous process Stealth navigation clamp on the T3 spinous process.  We obtained an intraoperative O-arm spin.  We first used a navigated high-speed bur to create pilot holes at bilateral T1 and T3 pedicles.  We then used navigated tap to create pedicle screw trajectories.  We then used a navigated screwdriver to place globus Creo pedicle screw shanks bilateral T1 and T3.  AP fluoroscopy was used to confirm proper placement.  We noticed that the right T1 pedicle screw appeared lateral to the pedicle.  We decided to correct this later in the case.  We then turned our attention to the decompression.  We resected the lower portion of the T1 spinous process and the entirety of the T2 spinous process we then drilled bilateral troughs along the T2 lamina and elevated it revealing the underlying ligamentum flavum.  We used a high-speed bur to extend the laminectomy defect into the inferior portion of the T1 lamina and the superior portion of the T3 lamina.  We then used a combination of curettes and rongeur's to resect the ligamentum flavum revealing the underlying dura.  Upon elevating the ligamentum flavum we started to see the hemorrhagic epidural tumor.  We then performed left sided T1-2 and T2-3 facetectomies in order to skeletonize the left T2 pedicle.  Rongeur's and high-speed bur were used to resect the T2 left spinous process.  Only a very small portion of the T2 pedicle still remained.  Otherwise the pedicle, lateral recess, and anterior spinal canal was filled with epidural tumor.  We isolated the left T2 nerve root, tied it off, and transected sharply.  We then turned our attention to the corpectomy and resection of T2 vertebral body tumor.  This  was quite hemorrhagic and required repeated efforts of hemostasis.  We used a rongeur's and electrocautery to resect the T2 vertebral body tumor piecemeal.  We used Penfield dissectors to establish the plane between the  ventral dura and the epidural tumor.  After creating an empty cavity within the vertebral body we were able to use down pushing curettes to push the epidural tumor underlying the PLL into the cavity. Greater than 50% of the original vertebral body / tumor complex was resected during the corpectomy process. We then identified the remaining T1-2 disc and T2-3 disc material.  This was resected using rongeurs and curettes revealing the underlying endplates which were not violated by tumor.  Once appropriate decompression of the dura was achieved and the majority of the tumor was resected within the corpectomy cavity, we turned our attention to placement of the corpectomy cage.  We used trial expanders to select a Globus Fortify expandable cage.  The cage was packed with demineralized bone matrix and cadaveric allograft.  We then inserted the corpectomy cage under live fluoroscopy.  After confirming proper placement we expanded the cage to approximately a height of 21 mm.  We then obtained another intraoperative O-arm spin in order to revise the right T1 pedicle screw.  Arm spin revealed that the T1 pedicle screw was lateral to the pedicle.  We used navigated instruments to create a more medial trajectory.  AP fluoroscopy confirmed proper placement.  Tulip heads were placed on all pedicle screws.  Bilateral 5.5 mm titanium rods were placed.  Set caps were placed and final tightened.  The remaining T1 spinous process, T1 lamina, T2 lamina, right T1-2 facet, right T2-3 facet, and remaining T3 spinous process and lamina were all decorticated.  Demineralized bone matrix and cadaveric allograft were laid over the decortication sites for bony fusion.  The wound was copiously irrigated with Betadine and saline.  Stasis was achieved.  A JP drain was tunneled subcutaneously.  The muscle was approximated with 1 Vicryl.  Fascia was approximate with 1 Vicryl.  The subcutaneous tissue was approximated with 2-0 and 3-0 Vicryl.   The skin was approximated with 4-0 Monocryl.  We stitched a running Prolene.  We then placed an incisional wound VAC   At the end of the case all sponge, needle, and instrument counts were correct. The patient was then transferred to the stretcher, extubated, and taken to the post-anesthesia care unit in stable hemodynamic condition.

## 2024-05-06 ENCOUNTER — Inpatient Hospital Stay (HOSPITAL_COMMUNITY): Payer: MEDICAID

## 2024-05-06 DIAGNOSIS — C801 Malignant (primary) neoplasm, unspecified: Secondary | ICD-10-CM | POA: Diagnosis not present

## 2024-05-06 DIAGNOSIS — C7951 Secondary malignant neoplasm of bone: Secondary | ICD-10-CM | POA: Diagnosis not present

## 2024-05-06 DIAGNOSIS — C73 Malignant neoplasm of thyroid gland: Secondary | ICD-10-CM

## 2024-05-06 DIAGNOSIS — I2699 Other pulmonary embolism without acute cor pulmonale: Secondary | ICD-10-CM | POA: Diagnosis not present

## 2024-05-06 LAB — BPAM PLATELET PHERESIS
Blood Product Expiration Date: 202511132359
Blood Product Expiration Date: 202511142359
Blood Product Expiration Date: 202511142359
Blood Product Expiration Date: 202511142359
Blood Product Expiration Date: 202511152359
ISSUE DATE / TIME: 202511121343
ISSUE DATE / TIME: 202511121807
ISSUE DATE / TIME: 202511130711
ISSUE DATE / TIME: 202511132125
Unit Type and Rh: 6200
Unit Type and Rh: 6200
Unit Type and Rh: 6200
Unit Type and Rh: 7300
Unit Type and Rh: 7300

## 2024-05-06 LAB — BASIC METABOLIC PANEL WITH GFR
Anion gap: 11 (ref 5–15)
BUN: 15 mg/dL (ref 6–20)
CO2: 24 mmol/L (ref 22–32)
Calcium: 8.8 mg/dL — ABNORMAL LOW (ref 8.9–10.3)
Chloride: 101 mmol/L (ref 98–111)
Creatinine, Ser: 0.86 mg/dL (ref 0.61–1.24)
GFR, Estimated: >60 mL/min (ref >60)
Glucose, Bld: 123 mg/dL — ABNORMAL HIGH (ref 70–99)
Potassium: 4.1 mmol/L (ref 3.5–5.1)
Sodium: 136 mmol/L (ref 135–145)

## 2024-05-06 LAB — PREPARE PLATELET PHERESIS
Unit division: 0
Unit division: 0
Unit division: 0
Unit division: 0
Unit division: 0

## 2024-05-06 LAB — APTT: aPTT: 32 s (ref 24–36)

## 2024-05-06 LAB — BPAM RBC
Blood Product Expiration Date: 202511242359
Blood Product Expiration Date: 202511242359
ISSUE DATE / TIME: 202511131147
ISSUE DATE / TIME: 202511131147
Unit Type and Rh: 6200
Unit Type and Rh: 6200

## 2024-05-06 LAB — PROTIME-INR
INR: 1.3 — ABNORMAL HIGH (ref 0.8–1.2)
Prothrombin Time: 16.7 s — ABNORMAL HIGH (ref 11.4–15.2)

## 2024-05-06 LAB — CBC
HCT: 34.7 % — ABNORMAL LOW (ref 39.0–52.0)
Hemoglobin: 11.9 g/dL — ABNORMAL LOW (ref 13.0–17.0)
MCH: 29.8 pg (ref 26.0–34.0)
MCHC: 34.3 g/dL (ref 30.0–36.0)
MCV: 87 fL (ref 80.0–100.0)
Platelets: UNDETERMINED K/uL (ref 150–400)
RBC: 3.99 MIL/uL — ABNORMAL LOW (ref 4.22–5.81)
RDW: 13.5 % (ref 11.5–15.5)
WBC: 20.9 K/uL — ABNORMAL HIGH (ref 4.0–10.5)
nRBC: 0 % (ref 0.0–0.2)

## 2024-05-06 LAB — TYPE AND SCREEN
ABO/RH(D): A POS
Antibody Screen: NEGATIVE
Unit division: 0
Unit division: 0

## 2024-05-06 LAB — CYTOLOGY - NON PAP

## 2024-05-06 LAB — HEPARIN LEVEL (UNFRACTIONATED): Heparin Unfractionated: <0.1 [IU]/mL — ABNORMAL LOW (ref 0.30–0.70)

## 2024-05-06 MED ORDER — GADOBUTROL 1 MMOL/ML IV SOLN
10.0000 mL | Freq: Once | INTRAVENOUS | Status: AC | PRN
  Administered 2024-05-06: 10 mL via INTRAVENOUS

## 2024-05-06 MED FILL — Thrombin For Soln 5000 Unit: CUTANEOUS | Qty: 3 | Status: AC

## 2024-05-06 MED FILL — Thrombin For Soln 5000 Unit: CUTANEOUS | Qty: 2 | Status: AC

## 2024-05-06 NOTE — Plan of Care (Signed)

## 2024-05-06 NOTE — Consult Note (Signed)
 WOC Nurse Consult Note: Reason for Consult: NPWT dressing change post surgery. Wound type: Surgical (resection of tumor thoracic) Pressure Injury POA: NA  WOC team will follow MON to first PO VAC change. Please reconsult if further assistance is needed. Thank-you,  Lela Holm MSN, RN, CNS.  (Phone [phone])

## 2024-05-06 NOTE — Assessment & Plan Note (Signed)
 Status post instrumentation and fusion and T1-T3, T2 corpectomy and resection of metastatic tumor Will follow-up with pathology

## 2024-05-06 NOTE — Progress Notes (Signed)
 Physician at bedside. Physician requested a consult with neuro surgery in regards to fever and giving antibiotics.

## 2024-05-06 NOTE — Progress Notes (Signed)
 IV team consult placed with comment Please pull PICC back 1-2cms to see if we can get it to flush. Pt had surgery today and MD does NOT want TPA administered via catheter until 11/17 if unable to fix problem mechanically.  Assessed PICC with both lumen occluded. With regards to comment above, will let PICC RN decide what to do. Patient noted to have 3 working PIV at this time.

## 2024-05-06 NOTE — Anesthesia Postprocedure Evaluation (Signed)
 Anesthesia Post Note  Patient: Joseph [name]  Procedure(s) Performed: POSTERIOR CERVICAL FUSION CERVICAL SEVEN-THORACIC ONE, THORACIC ONE-THORACIC TWO, THORACIC TWO-THORACIC THREE ,FOR RESECTION OF TUMOR THORACIC TWO CORPECTOMY APPLICATION OF O-ARM     Patient location during evaluation: PACU Anesthesia Type: General Level of consciousness: awake Pain management: pain level controlled Vital Signs Assessment: post-procedure vital signs reviewed and stable Respiratory status: spontaneous breathing, nonlabored ventilation and respiratory function stable Cardiovascular status: blood pressure returned to baseline and stable Postop Assessment: no apparent nausea or vomiting Anesthetic complications: no   There were no known notable events for this encounter.  Last Vitals:  Vitals:   05/06/24 0300 05/06/24 0431  BP: (!) 158/61   Pulse: 73   Resp: 14 15  Temp: 37.9 C   SpO2: 97% 94%    Last Pain:  Vitals:   05/06/24 0606  TempSrc:   PainSc: 2                  Verdine Grenfell P Lc Joynt

## 2024-05-06 NOTE — Assessment & Plan Note (Addendum)
 Right segmental Stable. No respiratory distress. Once cleared by neurosurgery, may resume anticoagulation

## 2024-05-06 NOTE — Progress Notes (Signed)
 PROGRESS NOTE   KYL GIVLER  FMW:[id number]    DOB: 05-30-1981    DOA: 04/27/2024  PCP: Patient, No Pcp Per   I have briefly reviewed patients previous medical records in [facility].   Brief Hospital Course:  43 year old gentleman with no significant medical history presented to the hospital with about 4 days of back pain and difficulty walking, he was having some back pain issues for about 2 weeks.  CT scan of the lumbar spine was unrevealing.  CTA chest PE protocol showed right upper lobe segmental PE. He was started on Eliquis. Patient continued to have difficulty with mobility.  Multiple investigations were done.  MRI of the thoracic spine ultimately showed pathological T2 compression fracture. Thyroid ultrasound showed large left thyroid nodule.  He was transferred to [facility] from [place] for Neurosurgery.  11/12, IR performed US  guided left lobe thyroid nodule biopsy.  11/13: S/p posterior T1-3 instrumentation fusion T2 corpectomy for resection of tumor.     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 11/13: S/p posterior T1-3 instrumentation fusion T2 corpectomy for resection of tumor.  Extensive procedure.  Hemorrhagic tumor.  1500 mL EBL.  Got 2 units of PRBC across surgery.  Recommends holding therapeutic anticoagulation for a week postop (start back 11/20) and okay to start DVT prophylaxis on 11/15. Thoracic tumor pathology pending.  Thyroid biopsy pathology confirms papillary carcinoma.  Consulted oncology. Fever of 102 F earlier this morning, suspect reactive due to extensive surgery yesterday.  Neurosurgery have no concerns for spine infection at this time.  Low clinical suspicion for infectious  etiology.  Monitor closely and if has recurrent high fevers then we will get blood cultures, UA and consider broad-spectrum antibiotics.  Significant leukocytosis likely secondary to postop steroids and reactive.  Follow CBCs. On Dilaudid  PCA.  Right upper lobe segmental PE Pulmonary artery hypertension Was on IV heparin bridge pending procedures prior to transitioning back to Eliquis.  See anticoagulation discussion above. Given possible cancer diagnosis, anticoagulation will be prolonged.  Acute blood loss anemia As noted above, EBL 1500 mL during neurosurgical procedure and on 11/13 and got 2 units PRBC.  Hemoglobin dropped from 12.8 preop to 10.5 yesterday.  Up to 11.9. Follow CBC closely.  Thrombocytopenia Mild and stable,?  Related to IV heparin.  Platelets clumped on today's CBCs and unable to estimate.  Follow CBC in AM.  Body mass index is 46.72 kg/m./Very morbid obesity class III Complicates care.  Outpatient follow-up.   DVT prophylaxis: Place and maintain sequential compression device Start: 05/01/24 1226     Code Status: Full Code:  Family Communication: Mother at bedside after patient consented. Disposition:  Status is: Inpatient Remains inpatient appropriate because: Pending above procedures.     Consultants:   Neurosurgery Neurology Interventional radiology  Procedures:   As above  Subjective:  Interviewed and examined patient along with his mother and patient's RN at bedside.  Reports ongoing upper back pain but better than last night.  No other complaints reported.  Discussed with RN to remove Foley catheter today.  Objective:   Vitals:   05/06/24 1015 05/06/24 1215 05/06/24 1235 05/06/24 1240  BP: (!) 156/69 (!) 169/80  (!) 156/70  Pulse: 84 77  84  Resp: (!) 9 10 17 13   Temp: (!) 100.8  F (38.2 C) 99.7 F (37.6 C)  100.2 F (37.9 C)  TempSrc: Oral Oral  Oral  SpO2: 91%  94% 93%  Weight:      Height:        General exam: Young male,  moderately built and morbidly obese lying comfortably supine in bed without distress.  Looks better compared to last night when he was immediate postop. Respiratory system: Clear to auscultation.  No increased work of breathing. Cardiovascular system: S1 & S2 heard, RRR. No JVD, murmurs, rubs, gallops or clicks. No pedal edema.  Telemetry personally reviewed: Sinus rhythm with occasional PVCs. Gastrointestinal system: Abdomen is nondistended, soft and nontender. No organomegaly or masses felt. Normal bowel sounds heard. Central nervous system: Alert and oriented.  No focal neurological deficits.   Extremities: Symmetric 4 x 5 power at least in all limbs. Skin: No rashes, lesions or ulcers Psychiatry: Judgement and insight appear normal. Mood & affect appropriate.  Has surgical drain and wound VAC in place.  Left upper arm PICC line, reportedly not fully functional per RN and getting evaluated by PICC team.   Data Reviewed:   I have personally reviewed following labs and imaging studies   CBC: Recent Labs  Lab 05/04/24 0500 05/05/24 0417 05/05/24 0948 05/05/24 1155 05/05/24 1409 05/06/24 0529  WBC 10.1 11.6*  --   --   --  20.9*  HGB 12.5* 12.8*   < > 11.6* 10.5* 11.9*  HCT 37.2* 38.3*   < > 34.0* 31.0* 34.7*  MCV 89.4 89.5  --   --   --  87.0  PLT 119* 112*  --   --   --  PLATELET CLUMPS NOTED ON SMEAR, UNABLE TO ESTIMATE   < > = values in this interval not displayed.    Basic Metabolic Panel: Recent Labs  Lab 05/04/24 0910 05/05/24 0948 05/05/24 1155 05/05/24 1409 05/06/24 0529  NA 137 136 135 137 136  K 4.1 4.1 4.2 4.4 4.1  CL 101  --   --   --  101  CO2 25  --   --   --  24  GLUCOSE 112*  --   --   --  123*  BUN 13  --   --   --  15  CREATININE 0.93  --   --   --  0.86  CALCIUM 9.0  --   --   --  8.8*    Liver Function Tests: No results for input(s): AST, ALT, ALKPHOS, BILITOT, PROT, ALBUMIN in the last 168 hours.   CBG: Recent Labs  Lab  05/02/24 0159  GLUCAP 136*    Microbiology Studies:   Recent Results (from the past 240 hours)  Surgical pcr screen     Status: Abnormal   Collection Time: 05/04/24  6:45 PM   Specimen: Nasal Mucosa; Nasal Swab  Result Value Ref Range Status   MRSA, PCR NEGATIVE NEGATIVE Final   Staphylococcus aureus POSITIVE (A) NEGATIVE Final    Comment: (NOTE) The Xpert SA Assay (FDA approved for NASAL specimens in patients 71 years of age and older), is one component of a comprehensive surveillance program. It is not intended to diagnose infection nor to guide or monitor treatment. Performed at [facility], [address] [place], [place] [address]     Radiology Studies:  CT THORACIC SPINE WO CONTRAST Result Date: 05/06/2024 EXAM: CT THORACIC SPINE WITHOUT CONTRAST 05/06/2024 12:01:33 PM TECHNIQUE: CT of the thoracic spine was performed without  the administration of intravenous contrast. Multiplanar reformatted images are provided for review. Automated exposure control, iterative reconstruction, and/or weight based adjustment of the mA/kV was utilized to reduce the radiation dose to as low as reasonably achievable. COMPARISON: Preoperative thoracic MRI and CT on 05/01/2024 and 05/02/2024. Postoperative MRI reported on 05/06/2024. Cervical spine CT on 05/02/2024. CLINICAL HISTORY: 43 year old male, status post upper thoracic corpectomy, decompression, and fusion. Pathologic fracture, vertebra plana with enhancing mass-like soft tissue at the T2 thoracic level. Evidence of other metastatic disease on CT chest, abdomen, and pelvis . surgical pathology pending. FINDINGS: Normal thoracic and lumbar vertebral segmentation on CT chest, abdomen, and pelvis. BONES AND ALIGNMENT: Sequelae of T2 corpectomy, posterior decompression at T1-T2 and T2-T3, and posterior fusion hardware in place with pedicle screws bilaterally at T1 and T3. No unexpected osseous changes. Lytic lesion partially visible in the C6  vertebral body. No new osseous abnormality identified. SOFT TISSUES: Small volume postoperative gas in and around the decompression site, in the left upper thoracic epidural space. Postoperative drain in place from a right percutaneous approach terminating in the posterior paraspinal soft tissues. Left upper extremity approach PICC line type catheter in place, terminating at the superior cavoatrial junction. Heterogeneous enlarged left thyroid lobe. Numerous small gallstones. Right greater than left dependent pulmonary atelectasis. Superimposed multiple small bilateral pulmonary nodules, individually up to 9 mm in diameter, most compatible with pulmonary metastases. IMPRESSION: 1. T1 through T3 decompression and fusion with T2 corpectomy. Posterior post-operative drain in place. No adverse features by CT. No new osseous abnormality identified in the thoracic spine. 2. Heterogeneous enlarged left thyroid. Partially visible known pulmonary metastases and lytic lesion of the C6 vertebral body 3. Gallstones. Electronically signed by: Helayne Hurst MD 05/06/2024 12:58 PM EST RP Workstation: [id number]   MR THORACIC SPINE W WO CONTRAST Result Date: 05/06/2024 CLINICAL DATA:  Follow-up examination is status post surgery. EXAM: MRI THORACIC WITHOUT AND WITH CONTRAST TECHNIQUE: Multiplanar and multiecho pulse sequences of the thoracic spine were obtained without and with intravenous contrast. CONTRAST:  10mL GADAVIST GADOBUTROL 1 MMOL/ML IV SOLN COMPARISON:  Comparison made with prior MRI from 05/01/2024. FINDINGS: Alignment: Stable alignment with preservation of the normal thoracic kyphosis. Underlying trace dextroscoliosis. No interval listhesis or malalignment. Vertebrae: Postoperative changes from interval posterior decompression and fusion at T1 through T3 with T2 corpectomy. The metastatic lesion at T2 has largely been resected, although residual tumor is noted along the anterolateral aspect of the vertebral column,  worse on the left (series 17, images 7, 10, 14 for example). No visible residual spinal stenosis, although the operative level is obscured by susceptibility artifact. No visible complication. Vertebral body height otherwise maintained with no other acute or chronic fracture. Decreased T1 signal intensity throughout the visualized bone marrow. Small benign hemangioma noted within the T6 vertebral body. No other worrisome osseous lesions or evidence for metastatic disease. Cord: Upper thoracic cord is obscured at the level of T2 due to susceptibility artifact. Otherwise, normal signal morphology. No abnormal enhancement. Paraspinal and other soft tissues: Postoperative changes within the upper posterior paraspinous soft tissues at T2. No loculated collection or adverse features. Small layering bilateral pleural effusions, right greater than left. Disc levels: No significant disc pathology seen within the underlying thoracic spine. No other stenosis or neural impingement. IMPRESSION: 1. Postoperative changes from interval posterior instrumentation with decompression and fusion at T1 through T3, with T2 corpectomy. No visible residual spinal stenosis, although the operative level is largely obscured by susceptibility artifact. No complication.  2. No other evidence for metastatic disease within the thoracic spine. 3. Small layering bilateral pleural effusions, right greater than left. Electronically Signed   By: Morene Hoard M.D.   On: 05/06/2024 03:46   DG Thoracic Spine 2 View Result Date: 05/05/2024 CLINICAL DATA:  Back pain. EXAM: THORACIC SPINE 2 VIEWS COMPARISON:  Chest radiograph dated 05/05/2024. FINDINGS: No acute fracture or subluxation of the thoracic spine. With upper thoracic posterior fusion noted. The soft tissues are unremarkable. IMPRESSION: 1. No acute findings. 2. Upper thoracic posterior fusion. Electronically Signed   By: Vanetta Chou M.D.   On: 05/05/2024 21:14   DG Chest 1  View Result Date: 05/05/2024 CLINICAL DATA:  PICC placement. EXAM: CHEST  1 VIEW COMPARISON:  Chest radiograph dated 05/02/2024. FINDINGS: Left-sided PICC with tip at the cavoatrial junction. There is eventration of the right hemidiaphragm. No focal consolidation, pleural effusion or pneumothorax. The cardiac silhouette is within normal limits. No acute osseous pathology. IMPRESSION: Left-sided PICC with tip at the cavoatrial junction. Electronically Signed   By: Vanetta Chou M.D.   On: 05/05/2024 21:13   DG Cervical Spine 2 or 3 views Result Date: 05/05/2024 CLINICAL DATA:  Elective surgery. EXAM: CERVICAL SPINE - 2-3 VIEW COMPARISON:  Preoperative imaging. FINDINGS: Six fluoroscopic spot views of the lower cervical and upper thoracic spine submitted from the operating room. Imaging obtained during posterior fusion C7 through T4 with T2 corpectomy. Fluoroscopy time 52.1 seconds. Dose 37.89 mGy. IMPRESSION: Intraoperative fluoroscopy during cervicothoracic surgery. Electronically Signed   By: Andrea Gasman M.D.   On: 05/05/2024 16:38   DG C-Arm 1-60 Min-No Report Result Date: 05/05/2024 Fluoroscopy was utilized by the requesting physician.  No radiographic interpretation.   DG C-Arm 1-60 Min-No Report Result Date: 05/05/2024 Fluoroscopy was utilized by the requesting physician.  No radiographic interpretation.   DG C-Arm 1-60 Min-No Report Result Date: 05/05/2024 Fluoroscopy was utilized by the requesting physician.  No radiographic interpretation.   DG C-Arm 1-60 Min-No Report Result Date: 05/05/2024 Fluoroscopy was utilized by the requesting physician.  No radiographic interpretation.   DG C-Arm 1-60 Min-No Report Result Date: 05/05/2024 Fluoroscopy was utilized by the requesting physician.  No radiographic interpretation.   DG C-Arm 1-60 Min-No Report Result Date: 05/05/2024 Fluoroscopy was utilized by the requesting physician.  No radiographic interpretation.   DG O-ARM  IMAGE ONLY/NO REPORT Result Date: 05/05/2024 There is no Radiologist interpretation  for this exam.  US  THYROID FNA EACH NODULE Result Date: 05/04/2024 INDICATION: [id number] Thyroid mass [id number] Indeterminate thyroid nodule EXAM: ULTRASOUND GUIDED FINE NEEDLE ASPIRATION OF INDETERMINATE THYROID NODULE COMPARISON:  U/S thyroid on 04/29/2024 CT CAP, 05/02/2024. MEDICATIONS: 4 cc of 1% lidocaine  COMPLICATIONS: None immediate. TECHNIQUE: Informed written consent was obtained from the patient after a discussion of the risks, benefits and alternatives to treatment. Questions regarding the procedure were encouraged and answered. A timeout was performed prior to the initiation of the procedure. Pre-procedural ultrasound scanning demonstrated unchanged size and appearance of the indeterminate nodule within the left lobe of the thyroid. The procedure was planned. The neck was prepped in the usual sterile fashion, and a sterile drape was applied covering the operative field. A timeout was performed prior to the initiation of the procedure. Local anesthesia was provided with 1% lidocaine . Under direct ultrasound guidance, 5 FNA biopsies were performed of the left thyroid nodule with a 25 gauge needle. Multiple ultrasound images were saved for procedural documentation purposes. The samples were prepared and submitted to pathology. Limited post  procedural scanning was negative for hematoma or additional complication. Dressings were placed. The patient tolerated the above procedures procedure well without immediate postprocedural complication. FINDINGS: Nodule reference number based on prior diagnostic ultrasound: 1 Maximum size: 8.2 cm Location: Left; occupies majority of lobe ACR TI-RADS risk category: TR4 (4-6 points) Reason for biopsy: meets ACR TI-RADS criteria Ultrasound imaging confirms appropriate placement of the needles within the thyroid nodule. IMPRESSION: Successful ultrasound guided FNA biopsy of a dominant 8.2 cm  LEFT TR-4 thyroid nodule Procedure performed by Carlin Griffon, PA-C under direct supervision of Thom Hall, MD Electronically Signed   By: Thom Hall M.D.   On: 05/04/2024 17:03    Scheduled Meds:    acetaminophen   1,000 mg Oral Q8H   Chlorhexidine  Gluconate Cloth  6 each Topical Daily   Chlorhexidine  Gluconate Cloth  6 each Topical Daily   cyclobenzaprine  10 mg Oral TID   dexamethasone  4 mg Oral Q12H   Followed by   NOREEN ON 05/08/2024] dexamethasone  4 mg Oral Daily   Followed by   NOREEN ON 05/10/2024] dexamethasone  2 mg Oral Daily   Followed by   NOREEN ON 05/12/2024] dexamethasone  1 mg Oral Daily   HYDROmorphone    Intravenous Q4H   methocarbamol  500 mg Oral TID   mupirocin ointment  1 Application Nasal BID   senna-docusate  1 tablet Oral BID   sodium chloride  flush  10-40 mL Intracatheter Q12H   sodium chloride  flush  3 mL Intravenous Q12H    Continuous Infusions:       LOS: 4 days     Trenda Mar, MD,  FACP, [facility], [facility], [facility]   [facility] [place]      To contact the attending provider between 7A-7P or the covering provider during after hours 7P-7A, please log into the web site [email or web address] and access using universal [place] password for that web site. If you do not have the password, please call the hospital operator.  05/06/2024, 2:47 PM

## 2024-05-06 NOTE — Progress Notes (Signed)
 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 Underwent posterior T1-3 instrumentation fusion T2 corpectomy for resection of tumor (11/13)  LOS: 4 days    Plan: Decadron wean in place F/u surgical pathology JP drain to bulb suction - 220cc. Keep today Wound vac on. Wound consult placed for routine changes Activity as tolerated. No brace needed Diet as tolerated Ok for DVT ppx on 11/15 Please hold therapeutic anticoagulation until 11/20 Defer decision on antibiotics to primary team. I have no concerns for spine infection at this time Will plan for removal of sutures in 3 weeks   Subjective: Pt's leg numbness is improved since surgery. Strength is approximately the same. Pt spiked a fever overnight  Objective: Vital signs in last 24 hours: Temp:  [98.2 F (36.8 C)-102 F (38.9 C)] 102 F (38.9 C) (11/14 0835) Pulse Rate:  [71-99] 84 (11/14 0835) Resp:  [9-18] 13 (11/14 0835) BP: (112-165)/(52-76) 143/64 (11/14 0835) SpO2:  [94 %-99 %] 94 % (11/14 0431) Arterial Line BP: (103-134)/(65-73) 103/71 (11/13 1700)  Intake/Output from previous day: 11/13 0701 - 11/14 0700 In: 5023 [P.O.:440; I.V.:3233; Blood:600; IV Piggyback:750] Out: 5495 [Urine:3775; Drains:220; Blood:1500] Intake/Output this shift: Total I/O In: -  Out: 15 [Drains:15]  Exam: RUE: 5/5 grip LUE: 5/5 grip BLE: 3/5 IP. 4/5 quad, ham, DF, EHL, PF Subjective sensory level from mid thoracic down bilaterally improved since yesterday Wound vac covering incision  Lab Results: Recent Labs    05/05/24 0417 05/05/24 0948 05/05/24 1409 05/06/24 0529  WBC 11.6*  --   --  20.9*  HGB 12.8*   < > 10.5* 11.9*  HCT 38.3*   < > 31.0* 34.7*  PLT 112*  --   --  PLATELET CLUMPS NOTED ON SMEAR, UNABLE TO ESTIMATE   < > = values in this interval  not displayed.   BMET Recent Labs    05/04/24 0910 05/05/24 0948 05/05/24 1409 05/06/24 0529  NA 137   < > 137 136  K 4.1   < > 4.4 4.1  CL 101  --   --  101  CO2 25  --   --  24  GLUCOSE 112*  --   --  123*  BUN 13  --   --  15  CREATININE 0.93  --   --  0.86  CALCIUM 9.0  --   --  8.8*   < > = values in this interval not displayed.       Joseph [name] 05/06/2024, 8:49 AM]

## 2024-05-06 NOTE — Assessment & Plan Note (Signed)
 Likely related to thyroid malignancy

## 2024-05-06 NOTE — Consult Note (Signed)
 [facility] Hematology and oncology consult note   Patient Care Team: Patient, No Pcp Per as PCP - General (General Practice)   ASSESSMENT & PLAN:  43 y.o.male with past medical history of anxiety, asthma, narcotic and alcohol use consulted for spinal metastases and thyroid mass.  FNA showed papillary carcinoma. I reviewed the imaging. Resection of spinal mass pathology is pending.  This will need to be follow-up to ensure not a second primary as de novo spine metastases is less common in papillary thyroid carcinoma.  If this is the case, there are treatment available and will consult ENT and radiation oncology. Discussed with patient will follow up next week.  Assessment & Plan Thoracic spine fracture (HCC) Status post instrumentation and fusion and T1-T3, T2 corpectomy and resection of metastatic tumor Will follow-up with pathology Acute pulmonary embolism (HCC) Right segmental Stable. No respiratory distress. Once cleared by neurosurgery, may resume anticoagulation Pulmonary nodules Likely related to thyroid malignancy Thyroid nodule Will need outpatient ENT follow-up and thyroidectomy   All questions were answered. Thank you for the consult.  [name], MD 05/06/2024 5:48 PM   CHIEF COMPLAINTS/PURPOSE OF ADMISSION Thyroid mass and spinal metastases  HISTORY OF PRESENTING ILLNESS:  Joseph [name] 43 y.o. male consulted for spinal diastases and thyroid mass. Patient is admitted for difficulty walking.  Report patient was having difficulty walking and found to have compression fracture.  Workup also showed PE, and thyroid nodule.  Patient was transferred to [facility] for neurosurgical intervention. Report back pain for a few weeks and worsening resulted in evaluation. He has some associated night sweats. No weight loss, palpable mass, decrease appetite. Rest of ROS was negative.  11/6 CTA showed right upper lobe segmental PE.  Numerous bilateral solid  pulmonary nodules up to 6 mm in the right lower lobe.  Enlarged thyroid lobe 5 cm.  11/9 MRI thoracic and cervical spine: Enhancing T2 lesion causing severe spinal canal stenosis and cord compression.  Abnormal enhancement extending into the ventral epidural space at T1 and T2 into the left, and T2 and T3 neuroforamina abutting the nerve root.  Enlarged thyroid nodule at least 4.5 cm.  11/10 CT CAP Numerous small bilateral pulmonary nodules throughout both lungs, at least 30, measuring 0.6 cm and smaller. Findings are highly suspicious for pulmonary metastatic disease given constellation of other findings.  T2 lytic lesion  Enlarged left lobe of thyroid 5.1 cm  No evidence of lymphadenopathy or metastatic disease in the abdomen and pelvis.  11/12 FNA showed papillary thyroid carcinoma 11/13: S/p posterior T1-3 instrumentation fusion T2 corpectomy for resection of tumor.     Pathology pending.    MEDICAL HISTORY:  Past Medical History:  Diagnosis Date   Anxiety    Asthma    as a child   Depression    Narcotic abuse (HCC)    Pulmonary nodules 04/29/2024    SURGICAL HISTORY: Past Surgical History:  Procedure Laterality Date   CLOSED REDUCTION FINGER WITH PERCUTANEOUS PINNING Right 06/22/2013   Procedure: RIGHT HAND SMALL FINGER CLOSED MANIPULATION AND PINNING;  Surgeon: Prentice LELON Pagan, MD;  Location: [facility] OR;  Service: Orthopedics;  Laterality: Right;   I & D EXTREMITY  03/24/2012   Procedure: IRRIGATION AND DEBRIDEMENT EXTREMITY;  Surgeon: Oneil JAYSON Herald, MD;  Location: [facility] OR;  Service: Orthopedics;  Laterality: Right;  Right Forearm    SOCIAL HISTORY: Social History   Socioeconomic History   Marital status: Single    Spouse name:  Not on file   Number of children: Not on file   Years of education: Not on file   Highest education level: Not on file  Occupational History   Not on file  Tobacco Use   Smoking status: Every Day    Current packs/day: 1.00    Average  packs/day: 1 pack/day for 10.0 years (10.0 ttl pk-yrs)    Types: Cigarettes   Smokeless tobacco: Never  Substance and Sexual Activity   Alcohol use: Not Currently   Drug use: Yes    Types: Marijuana    Comment: =   Sexual activity: Not on file  Other Topics Concern   Not on file  Social History Narrative   Not on file   Social Drivers of Health   Financial Resource Strain: Not on file  Food Insecurity: Not on file  Transportation Needs: Not on file  Physical Activity: Not on file  Stress: Not on file  Social Connections: Not on file  Intimate Partner Violence: Not on file    FAMILY HISTORY: Family History  Problem Relation Age of Onset   Other Other    Alcohol abuse Other     ALLERGIES:  has no known allergies.  MEDICATIONS:  Current Facility-Administered Medications  Medication Dose Route Frequency Provider Last Rate Last Admin   acetaminophen  (TYLENOL ) tablet 1,000 mg  1,000 mg Oral Q8H Garst, Jonathan R, MD   1,000 mg at 05/06/24 1440   Chlorhexidine  Gluconate Cloth 2 % PADS 6 each  6 each Topical Daily Darnella Dorn SAUNDERS, MD   6 each at 05/06/24 1007   Chlorhexidine  Gluconate Cloth 2 % PADS 6 each  6 each Topical Daily Darnella Dorn SAUNDERS, MD   6 each at 05/06/24 1007   cyclobenzaprine (FLEXERIL) tablet 10 mg  10 mg Oral TID Garst, Jonathan R, MD   10 mg at 05/06/24 1440   dexamethasone (DECADRON) tablet 4 mg  4 mg Oral Q12H Garst, Jonathan R, MD   4 mg at 05/06/24 1002   Followed by   NOREEN ON 05/08/2024] dexamethasone (DECADRON) tablet 4 mg  4 mg Oral Daily Darnella Dorn SAUNDERS, MD       Followed by   NOREEN ON 05/10/2024] dexamethasone (DECADRON) tablet 2 mg  2 mg Oral Daily Darnella Dorn SAUNDERS, MD       Followed by   NOREEN ON 05/12/2024] dexamethasone (DECADRON) tablet 1 mg  1 mg Oral Daily Garst, Jonathan R, MD       diphenhydrAMINE (BENADRYL) injection 12.5 mg  12.5 mg Intravenous Q6H PRN Darnella Dorn SAUNDERS, MD       Or   diphenhydrAMINE (BENADRYL) 12.5 MG/5ML  elixir 12.5 mg  12.5 mg Oral Q6H PRN Garst, Jonathan R, MD   12.5 mg at 05/05/24 2209   HYDROmorphone  (DILAUDID ) 1 mg/mL PCA injection   Intravenous Q4H Darnella Dorn SAUNDERS, MD   Received at 05/06/24 1235   methocarbamol (ROBAXIN) tablet 500 mg  500 mg Oral TID Garst, Jonathan R, MD   500 mg at 05/06/24 1440   mupirocin ointment (BACTROBAN) 2 % 1 Application  1 Application Nasal BID Darnella Dorn SAUNDERS, MD   1 Application at 05/06/24 1008   naloxone (NARCAN) injection 0.4 mg  0.4 mg Intravenous PRN Darnella Dorn SAUNDERS, MD       And   sodium chloride  flush (NS) 0.9 % injection 9 mL  9 mL Intravenous PRN Darnella Dorn SAUNDERS, MD       ondansetron  (ZOFRAN ) tablet 4  mg  4 mg Oral Q6H PRN Darnella Dorn SAUNDERS, MD       Or   ondansetron  (ZOFRAN ) injection 4 mg  4 mg Intravenous Q6H PRN Darnella Dorn SAUNDERS, MD       oxyCODONE  (Oxy IR/ROXICODONE ) immediate release tablet 10-15 mg  10-15 mg Oral Q6H PRN Garst, Jonathan R, MD   15 mg at 05/06/24 0143   senna-docusate (Senokot-S) tablet 1 tablet  1 tablet Oral BID Garst, Jonathan R, MD   1 tablet at 05/06/24 1002   sodium chloride  flush (NS) 0.9 % injection 10-40 mL  10-40 mL Intracatheter Q12H Darnella Dorn SAUNDERS, MD   10 mL at 05/04/24 2200   sodium chloride  flush (NS) 0.9 % injection 10-40 mL  10-40 mL Intracatheter PRN Darnella Dorn SAUNDERS, MD       sodium chloride  flush (NS) 0.9 % injection 3 mL  3 mL Intravenous Q12H Darnella Dorn SAUNDERS, MD   3 mL at 05/06/24 1003    REVIEW OF SYSTEMS:    All other systems were reviewed with the patient and are negative.  PHYSICAL EXAMINATION: ECOG PERFORMANCE STATUS: 0  Vitals:   05/06/24 1240 05/06/24 1600  BP: (!) 156/70 (!) 168/76  Pulse: 84 84  Resp: 13 13  Temp: 100.2 F (37.9 C) 99.9 F (37.7 C)  SpO2: 93% 92%   Filed Weights   04/27/24 2357 04/28/24 0100 05/05/24 0641  Weight: (!) 316 lb 5.8 oz (143.5 kg) (!) 316 lb 5.8 oz (143.5 kg) (!) 316 lb 5.8 oz (143.5 kg)    GENERAL: alert, no distress and  comfortable SKIN: skin color normal.  EYES: sclera clear OROPHARYNX: no exudate, moist NECK: supple. Soft.  Mass not palpable LYMPH:  no palpable cervical lymphadenopathy LUNGS: clear to auscultation and normal breathing effort.  No wheeze or rales HEART: regular rate & rhythm and no murmurs ABDOMEN:abdomen soft, non-tender Musculoskeletal:  no lower extremity edema NEURO: alert with fluent speech; no focal motor/sensory deficits Strength and sensation equal bilaterally  LABORATORY DATA:  I have reviewed the data as listed Lab Results  Component Value Date   WBC 20.9 (H) 05/06/2024   HGB 11.9 (L) 05/06/2024   HCT 34.7 (L) 05/06/2024   MCV 87.0 05/06/2024   PLT PLATELET CLUMPS NOTED ON SMEAR, UNABLE TO ESTIMATE 05/06/2024   Recent Labs    04/28/24 0000 04/29/24 0516 05/04/24 0910 05/05/24 0948 05/05/24 1155 05/05/24 1409 05/06/24 0529  NA  --  135 137   < > 135 137 136  K  --  4.3 4.1   < > 4.2 4.4 4.1  CL  --  101 101  --   --   --  101  CO2  --  28 25  --   --   --  24  GLUCOSE  --  137* 112*  --   --   --  123*  BUN  --  11 13  --   --   --  15  CREATININE  --  0.87 0.93  --   --   --  0.86  CALCIUM  --  9.4 9.0  --   --   --  8.8*  GFRNONAA  --  >60 >60  --   --   --  >60  PROT 7.9  --   --   --   --   --   --   ALBUMIN 3.7  --   --   --   --   --   --  AST 28  --   --   --   --   --   --   ALT 43  --   --   --   --   --   --   ALKPHOS 98  --   --   --   --   --   --   BILITOT 0.4  --   --   --   --   --   --   BILIDIR 0.2  --   --   --   --   --   --   IBILI 0.3  --   --   --   --   --   --    < > = values in this interval not displayed.    RADIOGRAPHIC STUDIES: I have personally reviewed the radiological images as listed and agreed with the findings in the report.

## 2024-05-06 NOTE — Progress Notes (Signed)
 On call neuro surgeon paged in regards to fever. Dr Darnella messaged as well.

## 2024-05-06 NOTE — Assessment & Plan Note (Signed)
 Will need outpatient ENT follow-up and thyroidectomy

## 2024-05-06 NOTE — Progress Notes (Signed)
 Patient running fever of 102F. Attending physician messaged.

## 2024-05-07 ENCOUNTER — Other Ambulatory Visit: Payer: Self-pay

## 2024-05-07 DIAGNOSIS — I2699 Other pulmonary embolism without acute cor pulmonale: Secondary | ICD-10-CM | POA: Diagnosis not present

## 2024-05-07 DIAGNOSIS — C801 Malignant (primary) neoplasm, unspecified: Secondary | ICD-10-CM | POA: Diagnosis not present

## 2024-05-07 LAB — COMPREHENSIVE METABOLIC PANEL WITH GFR
ALT: 29 U/L (ref 0–44)
AST: 21 U/L (ref 15–41)
Albumin: 3.1 g/dL — ABNORMAL LOW (ref 3.5–5.0)
Alkaline Phosphatase: 46 U/L (ref 38–126)
Anion gap: 10 (ref 5–15)
BUN: 14 mg/dL (ref 6–20)
CO2: 24 mmol/L (ref 22–32)
Calcium: 8.7 mg/dL — ABNORMAL LOW (ref 8.9–10.3)
Chloride: 101 mmol/L (ref 98–111)
Creatinine, Ser: 0.78 mg/dL (ref 0.61–1.24)
GFR, Estimated: >60 mL/min (ref >60)
Glucose, Bld: 139 mg/dL — ABNORMAL HIGH (ref 70–99)
Potassium: 4.1 mmol/L (ref 3.5–5.1)
Sodium: 135 mmol/L (ref 135–145)
Total Bilirubin: 1.2 mg/dL (ref 0.0–1.2)
Total Protein: 6.8 g/dL (ref 6.5–8.1)

## 2024-05-07 LAB — CBC
HCT: 36 % — ABNORMAL LOW (ref 39.0–52.0)
Hemoglobin: 12.2 g/dL — ABNORMAL LOW (ref 13.0–17.0)
MCH: 30 pg (ref 26.0–34.0)
MCHC: 33.9 g/dL (ref 30.0–36.0)
MCV: 88.5 fL (ref 80.0–100.0)
Platelets: UNDETERMINED K/uL (ref 150–400)
RBC: 4.07 MIL/uL — ABNORMAL LOW (ref 4.22–5.81)
RDW: 13.6 % (ref 11.5–15.5)
WBC: 22.3 K/uL — ABNORMAL HIGH (ref 4.0–10.5)
nRBC: 0 % (ref 0.0–0.2)

## 2024-05-07 LAB — APTT: aPTT: 33 s (ref 24–36)

## 2024-05-07 NOTE — Progress Notes (Addendum)
-----------------------------------------------------------  [facility] --------------------------------------------------------D(Data) A(Action) R(response) Note------------------------------------------------  Patient Name: Joseph [name] [place] Patient DOB: [id number] Date: 05/07/2024     Data: Patient has Telemetry level of care order.     Action: Messaged Dr Judeth, requesting transfer order to Tele floor if possible.    Response:  Awaiting response.   ADDENDUM 9148: Per Dr Judeth, will review patient and decide later. Order changed back to Progressive.      ALEC Lim, RN [facility]

## 2024-05-07 NOTE — Progress Notes (Signed)
 Peripherally Inserted Central Catheter Placement  The IV Nurse has discussed with the patient and/or persons authorized to consent for the patient, the purpose of this procedure and the potential benefits and risks involved with this procedure.  The benefits include less needle sticks, lab draws from the catheter, and the patient may be discharged home with the catheter. Risks include, but not limited to, infection, bleeding, blood clot (thrombus formation), and puncture of an artery; nerve damage and irregular heartbeat and possibility to perform a PICC exchange if needed/ordered by physician.  Alternatives to this procedure were also discussed.  Bard Power PICC patient education guide, fact sheet on infection prevention and patient information card has been provided to patient /or left at bedside.    PICC Placement Documentation  PICC Double Lumen 05/07/24 Left Brachial 46 cm 0 cm (Active)  Indication for Insertion or Continuance of Line Prolonged intravenous therapies 05/07/24 1100  Exposed Catheter (cm) 0 cm 05/07/24 1100  Site Assessment Clean, Dry, Intact 05/07/24 1100  Lumen #1 Status Flushed;Saline locked;Blood return noted 05/07/24 1100  Lumen #2 Status Flushed;Saline locked;Blood return noted 05/07/24 1100  Dressing Type Transparent;Securing device 05/07/24 1100  Dressing Status Antimicrobial disc/dressing in place;Clean, Dry, Intact 05/07/24 1100  Line Care Connections checked and tightened 05/07/24 1100  Line Adjustment (NICU/IV Team Only) No 05/07/24 1100  Dressing Intervention New dressing;Adhesive placed at insertion site (IV team only);Adhesive placed around edges of dressing (IV team/ICU RN only) 05/07/24 1100  Dressing Change Due 05/14/24 05/07/24 1100       Joseph [name] 05/07/2024, 11:29 AM

## 2024-05-07 NOTE — Progress Notes (Signed)
 PROGRESS NOTE   Joseph [name]  FMW:[id number]    DOB: 1980-08-16    DOA: 04/27/2024  PCP: Patient, No Pcp Per   I have briefly reviewed patients previous medical records in [facility].   Brief Hospital Course:  43 year old gentleman with no significant medical history presented to the hospital with about 4 days of back pain and difficulty walking, he was having some back pain issues for about 2 weeks.  CT scan of the lumbar spine was unrevealing.  CTA chest PE protocol showed right upper lobe segmental PE. He was started on Eliquis. Patient continued to have difficulty with mobility.  Multiple investigations were done.  MRI of the thoracic spine ultimately showed pathological T2 compression fracture. Thyroid ultrasound showed large left thyroid nodule.  He was transferred to [facility] from [place] for Neurosurgery.  11/12, IR performed US  guided left lobe thyroid nodule biopsy.  11/13: S/p posterior T1-3 instrumentation fusion T2 corpectomy for resection of tumor.  Thyroid biopsy showed papillary carcinoma, oncology consulted.   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  Still has drain and wound VAC to surgical site, total drainage 100 mL between them. 11/13: S/p posterior T1-3 instrumentation fusion T2 corpectomy for resection of tumor.  Extensive procedure.  Hemorrhagic tumor.  1500 mL EBL.  Got 2 units of PRBC across surgery.  Recommends holding therapeutic anticoagulation for a week postop (start back 11/20) and okay to start DVT prophylaxis on 11/15. Thoracic tumor pathology pending.  Thyroid biopsy pathology confirms papillary carcinoma.  Consulted oncology and input appreciated >need  to follow thoracic tumor pathology to make sure that he does not have a second primary.  They will follow-up next week. No further high fever since yesterday.  Suspected reactive.  Monitoring for now without antimicrobials.  If develops recurrent high fevers, will need workup. On Dilaudid  PCA.  Right upper lobe segmental PE Pulmonary artery hypertension Was on IV heparin bridge pending procedures prior to transitioning back to Eliquis.  See anticoagulation discussion above-to restart 11/20. Given possible cancer diagnosis, anticoagulation will be prolonged.  Acute blood loss anemia As noted above, EBL 1500 mL during neurosurgical procedure and on 11/13 and got 2 units PRBC.  Hemoglobin dropped from 12.8 preop to 10.5 yesterday.  Up to 11.9 >12.2.  Stable. Follow CBC.  Thrombocytopenia Mild and stable,?  Related to IV heparin.  Platelet clumping and counts not known.  Leukocytosis No clinical concern for infection at this time.  Suspected due to Decadron.  Continue to trend daily CBCs.  Body mass index is 46.72 kg/m./Very morbid obesity class III Complicates care.  Outpatient follow-up.   DVT prophylaxis: Place and maintain sequential compression device Start: 05/01/24 1226     Code Status: Full Code:  Family Communication: None at bedside.   Disposition:  Immediate postop, recovering.  Awaiting pathology results for further course.     Consultants:   Neurosurgery Neurology Interventional radiology  Procedures:   As above Difficult LUE PICC line placement by PICC team on 11/15.  Subjective:  Intrascapular pain slightly better.  Foley catheter removed yesterday and urinating well spontaneously.  Tolerating diet.  Discussed with patient's RN.  Objective:   Vitals:   05/07/24 0300 05/07/24 0423 05/07/24 0747 05/07/24 1311  BP: (!) 165/80  Pulse: 82     Resp: (!) 8 (!) 8 12 14   Temp: 99.2 F (37.3 C)     TempSrc: Axillary     SpO2: 93% 93%  93%  Weight:       Height:        General exam: Young male, moderately built and morbidly obese lying comfortably supine in bed without distress.   Respiratory system: Clear to auscultation.  No increased work of breathing.  Stable. Cardiovascular system: S1 & S2 heard, RRR. No JVD, murmurs, rubs, gallops or clicks. No pedal edema.  Telemetry personally reviewed: Sinus rhythm. Gastrointestinal system: Abdomen is nondistended, soft and nontender. No organomegaly or masses felt. Normal bowel sounds heard. Central nervous system: Alert and oriented.  No focal neurological deficits.   Extremities: Symmetric 4 x 5 power at least in all limbs. Skin: No rashes, lesions or ulcers Psychiatry: Judgement and insight appear normal. Mood & affect appropriate.  Has surgical drain (85 mL last 24 hours) and wound VAC in place (15 mL last 24 hours).  Left upper arm PICC line, new placed this morning.   Data Reviewed:   I have personally reviewed following labs and imaging studies   CBC: Recent Labs  Lab 05/05/24 0417 05/05/24 0948 05/05/24 1409 05/06/24 0529 05/07/24 0349  WBC 11.6*  --   --  20.9* 22.3*  HGB 12.8*   < > 10.5* 11.9* 12.2*  HCT 38.3*   < > 31.0* 34.7* 36.0*  MCV 89.5  --   --  87.0 88.5  PLT 112*  --   --  PLATELET CLUMPS NOTED ON SMEAR, UNABLE TO ESTIMATE PLATELET CLUMPS NOTED ON SMEAR, UNABLE TO ESTIMATE   < > = values in this interval not displayed.    Basic Metabolic Panel: Recent Labs  Lab 05/04/24 0910 05/05/24 0948 05/05/24 1155 05/05/24 1409 05/06/24 0529 05/07/24 0349  NA 137 136 135 137 136 135  K 4.1 4.1 4.2 4.4 4.1 4.1  CL 101  --   --   --  101 101  CO2 25  --   --   --  24 24  GLUCOSE 112*  --   --   --  123* 139*  BUN 13  --   --   --  15 14  CREATININE 0.93  --   --   --  0.86 0.78  CALCIUM 9.0  --   --   --  8.8* 8.7*    Liver Function Tests: Recent Labs  Lab 05/07/24 0349  AST 21  ALT 29  ALKPHOS 46  BILITOT 1.2  PROT 6.8  ALBUMIN 3.1*      CBG: Recent Labs  Lab 05/02/24 0159  GLUCAP 136*    Microbiology Studies:   Recent Results (from the past 240 hours)  Surgical pcr screen     Status: Abnormal   Collection Time: 05/04/24  6:45 PM   Specimen: Nasal Mucosa; Nasal Swab  Result Value Ref Range Status   MRSA, PCR NEGATIVE NEGATIVE Final   Staphylococcus aureus POSITIVE (A) NEGATIVE Final    Comment: (NOTE) The Xpert SA Assay (FDA approved for NASAL specimens in patients 74 years of age and older), is one component of a comprehensive surveillance program. It is not intended to diagnose infection nor to guide or monitor treatment. Performed at [facility], [address] [place], [place] [address]     Radiology Studies:  US  EKG SITE RITE Result Date: 05/07/2024 If [facility]  image not attached, placement could not be confirmed due to current cardiac rhythm.  DG CHEST PORT 1 VIEW Result Date: 05/06/2024 CLINICAL DATA:  Left PICC line infiltration.  Metastatic disease. EXAM: PORTABLE CHEST 1 VIEW COMPARISON:  05/05/2024. CT chest, abdomen and pelvis dated 05/02/2024. FINDINGS: The left PICC tip is poorly visualized and appears to be in the region of the superior cavoatrial junction. Normal sized heart. Multiple small, faintly visible bilateral pulmonary nodules, similar to the previous CT. Mild-to-moderate peribronchial thickening. Upper thoracic spine laminectomy defect with fixation hardware and corpectomy spacer. IMPRESSION: 1. The left PICC tip is poorly visualized and appears to be in the region of the superior cavoatrial junction. 2. Mild to moderate bronchitic changes. 3. Multiple small, faintly visible bilateral pulmonary nodules, similar to the previous CT, compatible with possible metastatic disease. Electronically Signed   By: Elspeth Bathe M.D.   On: 05/06/2024 17:17   CT THORACIC SPINE WO CONTRAST Result Date: 05/06/2024 EXAM: CT THORACIC SPINE WITHOUT CONTRAST 05/06/2024 12:01:33 PM TECHNIQUE:  CT of the thoracic spine was performed without the administration of intravenous contrast. Multiplanar reformatted images are provided for review. Automated exposure control, iterative reconstruction, and/or weight based adjustment of the mA/kV was utilized to reduce the radiation dose to as low as reasonably achievable. COMPARISON: Preoperative thoracic MRI and CT on 05/01/2024 and 05/02/2024. Postoperative MRI reported on 05/06/2024. Cervical spine CT on 05/02/2024. CLINICAL HISTORY: 43 year old male, status post upper thoracic corpectomy, decompression, and fusion. Pathologic fracture, vertebra plana with enhancing mass-like soft tissue at the T2 thoracic level. Evidence of other metastatic disease on CT chest, abdomen, and pelvis . surgical pathology pending. FINDINGS: Normal thoracic and lumbar vertebral segmentation on CT chest, abdomen, and pelvis. BONES AND ALIGNMENT: Sequelae of T2 corpectomy, posterior decompression at T1-T2 and T2-T3, and posterior fusion hardware in place with pedicle screws bilaterally at T1 and T3. No unexpected osseous changes. Lytic lesion partially visible in the C6 vertebral body. No new osseous abnormality identified. SOFT TISSUES: Small volume postoperative gas in and around the decompression site, in the left upper thoracic epidural space. Postoperative drain in place from a right percutaneous approach terminating in the posterior paraspinal soft tissues. Left upper extremity approach PICC line type catheter in place, terminating at the superior cavoatrial junction. Heterogeneous enlarged left thyroid lobe. Numerous small gallstones. Right greater than left dependent pulmonary atelectasis. Superimposed multiple small bilateral pulmonary nodules, individually up to 9 mm in diameter, most compatible with pulmonary metastases. IMPRESSION: 1. T1 through T3 decompression and fusion with T2 corpectomy. Posterior post-operative drain in place. No adverse features by CT. No new osseous  abnormality identified in the thoracic spine. 2. Heterogeneous enlarged left thyroid. Partially visible known pulmonary metastases and lytic lesion of the C6 vertebral body 3. Gallstones. Electronically signed by: Helayne Hurst MD 05/06/2024 12:58 PM EST RP Workstation: [id number]   MR THORACIC SPINE W WO CONTRAST Result Date: 05/06/2024 CLINICAL DATA:  Follow-up examination is status post surgery. EXAM: MRI THORACIC WITHOUT AND WITH CONTRAST TECHNIQUE: Multiplanar and multiecho pulse sequences of the thoracic spine were obtained without and with intravenous contrast. CONTRAST:  10mL GADAVIST GADOBUTROL 1 MMOL/ML IV SOLN COMPARISON:  Comparison made with prior MRI from 05/01/2024. FINDINGS: Alignment: Stable alignment with preservation of the normal thoracic kyphosis. Underlying trace dextroscoliosis. No interval listhesis or malalignment. Vertebrae: Postoperative changes from interval posterior decompression and fusion at T1 through T3 with T2 corpectomy. The metastatic lesion at T2 has largely been resected, although residual tumor is noted along  the anterolateral aspect of the vertebral column, worse on the left (series 17, images 7, 10, 14 for example). No visible residual spinal stenosis, although the operative level is obscured by susceptibility artifact. No visible complication. Vertebral body height otherwise maintained with no other acute or chronic fracture. Decreased T1 signal intensity throughout the visualized bone marrow. Small benign hemangioma noted within the T6 vertebral body. No other worrisome osseous lesions or evidence for metastatic disease. Cord: Upper thoracic cord is obscured at the level of T2 due to susceptibility artifact. Otherwise, normal signal morphology. No abnormal enhancement. Paraspinal and other soft tissues: Postoperative changes within the upper posterior paraspinous soft tissues at T2. No loculated collection or adverse features. Small layering bilateral pleural effusions,  right greater than left. Disc levels: No significant disc pathology seen within the underlying thoracic spine. No other stenosis or neural impingement. IMPRESSION: 1. Postoperative changes from interval posterior instrumentation with decompression and fusion at T1 through T3, with T2 corpectomy. No visible residual spinal stenosis, although the operative level is largely obscured by susceptibility artifact. No complication. 2. No other evidence for metastatic disease within the thoracic spine. 3. Small layering bilateral pleural effusions, right greater than left. Electronically Signed   By: Morene Hoard M.D.   On: 05/06/2024 03:46   DG Thoracic Spine 2 View Result Date: 05/05/2024 CLINICAL DATA:  Back pain. EXAM: THORACIC SPINE 2 VIEWS COMPARISON:  Chest radiograph dated 05/05/2024. FINDINGS: No acute fracture or subluxation of the thoracic spine. With upper thoracic posterior fusion noted. The soft tissues are unremarkable. IMPRESSION: 1. No acute findings. 2. Upper thoracic posterior fusion. Electronically Signed   By: Vanetta Chou M.D.   On: 05/05/2024 21:14   DG Chest 1 View Result Date: 05/05/2024 CLINICAL DATA:  PICC placement. EXAM: CHEST  1 VIEW COMPARISON:  Chest radiograph dated 05/02/2024. FINDINGS: Left-sided PICC with tip at the cavoatrial junction. There is eventration of the right hemidiaphragm. No focal consolidation, pleural effusion or pneumothorax. The cardiac silhouette is within normal limits. No acute osseous pathology. IMPRESSION: Left-sided PICC with tip at the cavoatrial junction. Electronically Signed   By: Vanetta Chou M.D.   On: 05/05/2024 21:13   DG Cervical Spine 2 or 3 views Result Date: 05/05/2024 CLINICAL DATA:  Elective surgery. EXAM: CERVICAL SPINE - 2-3 VIEW COMPARISON:  Preoperative imaging. FINDINGS: Six fluoroscopic spot views of the lower cervical and upper thoracic spine submitted from the operating room. Imaging obtained during posterior fusion  C7 through T4 with T2 corpectomy. Fluoroscopy time 52.1 seconds. Dose 37.89 mGy. IMPRESSION: Intraoperative fluoroscopy during cervicothoracic surgery. Electronically Signed   By: Andrea Gasman M.D.   On: 05/05/2024 16:38   DG C-Arm 1-60 Min-No Report Result Date: 05/05/2024 Fluoroscopy was utilized by the requesting physician.  No radiographic interpretation.   DG C-Arm 1-60 Min-No Report Result Date: 05/05/2024 Fluoroscopy was utilized by the requesting physician.  No radiographic interpretation.   DG C-Arm 1-60 Min-No Report Result Date: 05/05/2024 Fluoroscopy was utilized by the requesting physician.  No radiographic interpretation.   DG C-Arm 1-60 Min-No Report Result Date: 05/05/2024 Fluoroscopy was utilized by the requesting physician.  No radiographic interpretation.   DG C-Arm 1-60 Min-No Report Result Date: 05/05/2024 Fluoroscopy was utilized by the requesting physician.  No radiographic interpretation.   DG C-Arm 1-60 Min-No Report Result Date: 05/05/2024 Fluoroscopy was utilized by the requesting physician.  No radiographic interpretation.   DG O-ARM IMAGE ONLY/NO REPORT Result Date: 05/05/2024 There is no Radiologist interpretation  for this  exam.   Scheduled Meds:    acetaminophen   1,000 mg Oral Q8H   Chlorhexidine  Gluconate Cloth  6 each Topical Daily   Chlorhexidine  Gluconate Cloth  6 each Topical Daily   cyclobenzaprine  10 mg Oral TID   [START ON 05/08/2024] dexamethasone  4 mg Oral Daily   Followed by   NOREEN ON 05/10/2024] dexamethasone  2 mg Oral Daily   Followed by   NOREEN ON 05/12/2024] dexamethasone  1 mg Oral Daily   HYDROmorphone    Intravenous Q4H   methocarbamol  500 mg Oral TID   mupirocin ointment  1 Application Nasal BID   senna-docusate  1 tablet Oral BID   sodium chloride  flush  10-40 mL Intracatheter Q12H   sodium chloride  flush  3 mL Intravenous Q12H    Continuous Infusions:       LOS: 5 days     Trenda Mar, MD,   FACP, [facility], [facility], [facility]   [facility] [place]      To contact the attending provider between 7A-7P or the covering provider during after hours 7P-7A, please log into the web site [email or web address] and access using universal  password for that web site. If you do not have the password, please call the hospital operator.  05/07/2024, 1:21 PM

## 2024-05-07 NOTE — Progress Notes (Signed)
 NEUROSURGERY PROGRESS NOTE  Doing well. Complains of appropriate interscapular soreness. No arm or leg pain Incision CDI  Temp:  [99.2 F (37.3 C)-100.2 F (37.9 C)] 99.2 F (37.3 C) (11/15 0300) Pulse Rate:  [74-93] 82 (11/15 0300) Resp:  [8-17] 12 (11/15 0747) BP: (141-176)/(70-81) 165/80 (11/15 0300) SpO2:  [92 %-96 %] 93 % (11/15 0423) FiO2 (%):  [92 %] 92 % (11/15 0423)  Plan: Doing well, continue therapy  Suzen Chiquita Pean, NP 05/07/2024 10:22 AM

## 2024-05-08 DIAGNOSIS — I2699 Other pulmonary embolism without acute cor pulmonale: Secondary | ICD-10-CM | POA: Diagnosis not present

## 2024-05-08 DIAGNOSIS — C801 Malignant (primary) neoplasm, unspecified: Secondary | ICD-10-CM | POA: Diagnosis not present

## 2024-05-08 LAB — CBC
HCT: 32.7 % — ABNORMAL LOW (ref 39.0–52.0)
Hemoglobin: 11 g/dL — ABNORMAL LOW (ref 13.0–17.0)
MCH: 30.5 pg (ref 26.0–34.0)
MCHC: 33.6 g/dL (ref 30.0–36.0)
MCV: 90.6 fL (ref 80.0–100.0)
Platelets: UNDETERMINED K/uL (ref 150–400)
RBC: 3.61 MIL/uL — ABNORMAL LOW (ref 4.22–5.81)
RDW: 13.6 % (ref 11.5–15.5)
WBC: 21.5 K/uL — ABNORMAL HIGH (ref 4.0–10.5)
nRBC: 0 % (ref 0.0–0.2)

## 2024-05-08 MED ORDER — HYDROMORPHONE HCL 1 MG/ML IJ SOLN
1.0000 mg | INTRAMUSCULAR | Status: DC | PRN
  Administered 2024-05-08 – 2024-05-16 (×33): 1 mg via INTRAVENOUS
  Filled 2024-05-08 (×35): qty 1

## 2024-05-08 NOTE — Progress Notes (Signed)
 Patient ID: Joseph [name], male   DOB: Sep 15, 1980, 44 y.o.   MRN: [id number] States he is doing a little better today.  Big complaint remains pain in the right shoulder joint especially with movement.  There is some pain to palpation.  He seems to be moving his arms and hands and legs fairly well.  His pain otherwise is well-controlled.  Consider imaging of right shoulder.  He has had plain films only.  He states that the pain in the shoulder predates the surgery.  He is had it for months but it is much more severe now.

## 2024-05-08 NOTE — Progress Notes (Signed)
 Dilaudid  PCA syringe stopped and wasted remaining 5mg  with Cena Lango, RN witness in appropriate bin.

## 2024-05-08 NOTE — Plan of Care (Signed)

## 2024-05-08 NOTE — Progress Notes (Signed)
 PROGRESS NOTE   Joseph [name]  FMW:[id number]    DOB: 1981-06-18    DOA: 04/27/2024  PCP: Patient, No Pcp Per   I have briefly reviewed patients previous medical records in [facility].   Brief Hospital Course:  43 year old gentleman with no significant medical history presented to the hospital with about 4 days of back pain and difficulty walking, he was having some back pain issues for about 2 weeks.  CT scan of the lumbar spine was unrevealing.  CTA chest PE protocol showed right upper lobe segmental PE. He was started on Eliquis. Patient continued to have difficulty with mobility.  Multiple investigations were done.  MRI of the thoracic spine ultimately showed pathological T2 compression fracture. Thyroid ultrasound showed large left thyroid nodule.  He was transferred to [facility] from [place] for Neurosurgery.  11/12, IR performed US  guided left lobe thyroid nodule biopsy.  11/13: S/p posterior T1-3 instrumentation fusion T2 corpectomy for resection of tumor.  Thyroid biopsy showed papillary carcinoma, oncology consulted.   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  Still has drain (80 mL last 24 hours) and wound VAC to surgical site (no output last 24 hours). 11/13: S/p posterior T1-3 instrumentation fusion T2 corpectomy for resection of tumor.  Extensive procedure.  Hemorrhagic tumor.  1500 mL EBL.  Got 2 units of PRBC across surgery.  Recommends holding therapeutic anticoagulation for a week postop (start back 11/20) and okay to start DVT prophylaxis on 11/15. Thoracic tumor pathology pending.  Thyroid biopsy pathology confirms papillary carcinoma.  Consulted oncology and input  appreciated >need to follow thoracic tumor pathology to make sure that he does not have a second primary.  They will follow-up next week. Has defervesced without recurrence postop day 1 fever likely related to surgery and instrumentation. Multimodality pain control.  Dilaudid  PCA discontinued. Defer mobilization, therapy orders to Neurosurgery.  Right upper lobe segmental PE Pulmonary artery hypertension Was on IV heparin bridge pending procedures prior to transitioning back to Eliquis.  See anticoagulation discussion above-to restart 11/20. Given possible cancer diagnosis, anticoagulation will be prolonged.  Acute blood loss anemia As noted above, EBL 1500 mL during neurosurgical procedure and on 11/13 and got 2 units PRBC.  Hemoglobin dropped from 12.8 preop to 10.5 yesterday.  Up to 11.9 >12.2 >11.  Stable. Follow CBC.  Thrombocytopenia Mild and stable,?  Related to IV heparin.  Platelet clumping on daily serial CBCs and counts not known.  Leukocytosis No clinical concern for infection at this time.  Suspected due to Decadron.  Continue to trend daily CBCs.  Right shoulder pain Pain predates his current surgery.  Reportedly worse.  Continue pain management as above and consider advanced imaging i.e. MRI versus CT.  Will review with orthopedics team in AM.  Body mass index is 46.72 kg/m./Very morbid obesity class III Complicates care.  Outpatient follow-up.   DVT prophylaxis: Place and maintain sequential compression device Start: 05/01/24 1226     Code Status: Full Code:  Family Communication: None at bedside.   Disposition:  Immediate postop, recovering.  Awaiting pathology results for further course.     Consultants:   Neurosurgery Neurology Interventional radiology  Procedures:   As above Difficult LUE PICC line placement by PICC team on 11/15.  Subjective:  Seen  this morning.  Right older pain.  Tolerating diet.  No other complaints.  Objective:   Vitals:    05/08/24 0403 05/08/24 0800 05/08/24 0832 05/08/24 1221  BP:   (!) 155/67 137/60  Pulse:   71 75  Resp: (!) 8 10 12 13   Temp:   (!) 97.2 F (36.2 C) 98.7 F (37.1 C)  TempSrc:   Oral Oral  SpO2: 93%  96% 93%  Weight:      Height:        General exam: Young male, moderately built and morbidly obese lying comfortably supine in bed without distress.   Respiratory system: Clear to auscultation.  No increased work of breathing.  Stable. Cardiovascular system: S1 & S2 heard, RRR. No JVD, murmurs, rubs, gallops or clicks. No pedal edema.  Telemetry personally reviewed: Sinus rhythm. Gastrointestinal system: Abdomen is nondistended, soft and nontender. No organomegaly or masses felt. Normal bowel sounds heard. Central nervous system: Alert and oriented.  No focal neurological deficits.   Extremities: Symmetric 4 x 5 power at least in all limbs.  Right shoulder without any acute findings externally.  Painful range of movements. Skin: No rashes, lesions or ulcers Psychiatry: Judgement and insight appear normal. Mood & affect appropriate.  Has surgical drain and wound VAC in place.  Left upper arm PICC line.   Data Reviewed:   I have personally reviewed following labs and imaging studies   CBC: Recent Labs  Lab 05/06/24 0529 05/07/24 0349 05/08/24 0500  WBC 20.9* 22.3* 21.5*  HGB 11.9* 12.2* 11.0*  HCT 34.7* 36.0* 32.7*  MCV 87.0 88.5 90.6  PLT PLATELET CLUMPS NOTED ON SMEAR, UNABLE TO ESTIMATE PLATELET CLUMPS NOTED ON SMEAR, UNABLE TO ESTIMATE PLATELET CLUMPS NOTED ON SMEAR, UNABLE TO ESTIMATE    Basic Metabolic Panel: Recent Labs  Lab 05/04/24 0910 05/05/24 0948 05/05/24 1155 05/05/24 1409 05/06/24 0529 05/07/24 0349  NA 137 136 135 137 136 135  K 4.1 4.1 4.2 4.4 4.1 4.1  CL 101  --   --   --  101 101  CO2 25  --   --   --  24 24  GLUCOSE 112*  --   --   --  123* 139*  BUN 13  --   --   --  15 14  CREATININE 0.93  --   --   --  0.86 0.78  CALCIUM 9.0  --   --   --   8.8* 8.7*    Liver Function Tests: Recent Labs  Lab 05/07/24 0349  AST 21  ALT 29  ALKPHOS 46  BILITOT 1.2  PROT 6.8  ALBUMIN 3.1*     CBG: Recent Labs  Lab 05/02/24 0159  GLUCAP 136*    Microbiology Studies:   Recent Results (from the past 240 hours)  Surgical pcr screen     Status: Abnormal   Collection Time: 05/04/24  6:45 PM   Specimen: Nasal Mucosa; Nasal Swab  Result Value Ref Range Status   MRSA, PCR NEGATIVE NEGATIVE Final   Staphylococcus aureus POSITIVE (A) NEGATIVE Final    Comment: (NOTE) The Xpert SA Assay (FDA approved for NASAL specimens in patients 11 years of age and older), is one component of a comprehensive surveillance program. It is not intended to diagnose infection nor to guide or monitor treatment. Performed at [facility], [address] [place], [place] [address]     Radiology Studies:  US  EKG SITE RITE Result Date: 05/07/2024 If  Site Rite image not attached, placement could not be confirmed due to current cardiac rhythm.  DG CHEST PORT 1 VIEW Result Date: 05/06/2024 CLINICAL DATA:  Left PICC line infiltration.  Metastatic disease. EXAM: PORTABLE CHEST 1 VIEW COMPARISON:  05/05/2024. CT chest, abdomen and pelvis dated 05/02/2024. FINDINGS: The left PICC tip is poorly visualized and appears to be in the region of the superior cavoatrial junction. Normal sized heart. Multiple small, faintly visible bilateral pulmonary nodules, similar to the previous CT. Mild-to-moderate peribronchial thickening. Upper thoracic spine laminectomy defect with fixation hardware and corpectomy spacer. IMPRESSION: 1. The left PICC tip is poorly visualized and appears to be in the region of the superior cavoatrial junction. 2. Mild to moderate bronchitic changes. 3. Multiple small, faintly visible bilateral pulmonary nodules, similar to the previous CT, compatible with possible metastatic disease. Electronically Signed   By: Elspeth Bathe M.D.   On:  05/06/2024 17:17    Scheduled Meds:    acetaminophen   1,000 mg Oral Q8H   Chlorhexidine  Gluconate Cloth  6 each Topical Daily   Chlorhexidine  Gluconate Cloth  6 each Topical Daily   cyclobenzaprine  10 mg Oral TID   dexamethasone  4 mg Oral Daily   Followed by   NOREEN ON 05/10/2024] dexamethasone  2 mg Oral Daily   Followed by   NOREEN ON 05/12/2024] dexamethasone  1 mg Oral Daily   methocarbamol  500 mg Oral TID   mupirocin ointment  1 Application Nasal BID   senna-docusate  1 tablet Oral BID   sodium chloride  flush  10-40 mL Intracatheter Q12H   sodium chloride  flush  3 mL Intravenous Q12H    Continuous Infusions:       LOS: 6 days     Trenda Mar, MD,  FACP, [facility], [facility], [facility]   [facility] [place]      To contact the attending provider between 7A-7P or the covering provider during after hours 7P-7A, please log into the web site [email or web address] and access using universal [place] password for that web site. If you do not have the password, please call the hospital operator.  05/08/2024, 2:37 PM

## 2024-05-09 ENCOUNTER — Encounter (HOSPITAL_COMMUNITY): Payer: Self-pay

## 2024-05-09 DIAGNOSIS — C801 Malignant (primary) neoplasm, unspecified: Secondary | ICD-10-CM | POA: Diagnosis not present

## 2024-05-09 DIAGNOSIS — I2699 Other pulmonary embolism without acute cor pulmonale: Secondary | ICD-10-CM | POA: Diagnosis not present

## 2024-05-09 LAB — CBC WITH DIFFERENTIAL/PLATELET
Abs Immature Granulocytes: 0.83 K/uL — ABNORMAL HIGH (ref 0.00–0.07)
Basophils Absolute: 0.1 K/uL (ref 0.0–0.1)
Basophils Relative: 0 %
Eosinophils Absolute: 0.4 K/uL (ref 0.0–0.5)
Eosinophils Relative: 2 %
HCT: 33.3 % — ABNORMAL LOW (ref 39.0–52.0)
Hemoglobin: 11.1 g/dL — ABNORMAL LOW (ref 13.0–17.0)
Immature Granulocytes: 4 %
Lymphocytes Relative: 13 %
Lymphs Abs: 2.5 K/uL (ref 0.7–4.0)
MCH: 30.2 pg (ref 26.0–34.0)
MCHC: 33.3 g/dL (ref 30.0–36.0)
MCV: 90.5 fL (ref 80.0–100.0)
Monocytes Absolute: 2.1 K/uL — ABNORMAL HIGH (ref 0.1–1.0)
Monocytes Relative: 11 %
Neutro Abs: 13.9 K/uL — ABNORMAL HIGH (ref 1.7–7.7)
Neutrophils Relative %: 70 %
Platelets: UNDETERMINED K/uL (ref 150–400)
RBC: 3.68 MIL/uL — ABNORMAL LOW (ref 4.22–5.81)
RDW: 13.8 % (ref 11.5–15.5)
WBC: 19.9 K/uL — ABNORMAL HIGH (ref 4.0–10.5)
nRBC: 0 % (ref 0.0–0.2)

## 2024-05-09 LAB — CBC
HCT: 33.1 % — ABNORMAL LOW (ref 39.0–52.0)
Hemoglobin: 11 g/dL — ABNORMAL LOW (ref 13.0–17.0)
MCH: 30.1 pg (ref 26.0–34.0)
MCHC: 33.2 g/dL (ref 30.0–36.0)
MCV: 90.7 fL (ref 80.0–100.0)
Platelets: UNDETERMINED K/uL (ref 150–400)
RBC: 3.65 MIL/uL — ABNORMAL LOW (ref 4.22–5.81)
RDW: 13.8 % (ref 11.5–15.5)
WBC: 19.7 K/uL — ABNORMAL HIGH (ref 4.0–10.5)
nRBC: 0 % (ref 0.0–0.2)

## 2024-05-09 LAB — TECHNOLOGIST SMEAR REVIEW

## 2024-05-09 LAB — APTT: aPTT: 32 s (ref 24–36)

## 2024-05-09 MED ORDER — ENSURE MAX PROTEIN PO LIQD
11.0000 [oz_av] | Freq: Two times a day (BID) | ORAL | Status: DC
  Administered 2024-05-09 – 2024-05-11 (×5): 11 [oz_av] via ORAL
  Administered 2024-05-12: 237 mL via ORAL
  Administered 2024-05-13 – 2024-05-16 (×6): 11 [oz_av] via ORAL
  Filled 2024-05-09 (×16): qty 330

## 2024-05-09 MED ORDER — ADULT MULTIVITAMIN W/MINERALS CH
1.0000 | ORAL_TABLET | Freq: Every day | ORAL | Status: DC
  Administered 2024-05-09 – 2024-05-16 (×8): 1 via ORAL
  Filled 2024-05-09 (×8): qty 1

## 2024-05-09 MED ORDER — DOCUSATE SODIUM 100 MG PO CAPS
100.0000 mg | ORAL_CAPSULE | Freq: Once | ORAL | Status: AC
  Administered 2024-05-09: 100 mg via ORAL
  Filled 2024-05-09: qty 1

## 2024-05-09 MED ORDER — HEPARIN SODIUM (PORCINE) 5000 UNIT/ML IJ SOLN
5000.0000 [IU] | Freq: Three times a day (TID) | INTRAMUSCULAR | Status: DC
  Administered 2024-05-09 – 2024-05-12 (×9): 5000 [IU] via SUBCUTANEOUS
  Filled 2024-05-09 (×10): qty 1

## 2024-05-09 NOTE — Consult Note (Signed)
 WOC Nurse Consult Note: Reason for Consult:VAC dressing change for post op incisional site : S/p posterior T1-3 instrumentation fusion T2 corpectomy for resection of tumor on 05/05/24 Wound type: surgical incision, sutures intact Pressure Injury POA: NA Measurement: 12 cm suture line, intact Wound bed:not visible, edges are approximated Drainage (amount, consistency, odor) scant weeping Periwound:6 o'clock, nonintact lesion, possibly from medical adhesive.  Ruptured blister with minimal bleeding noted.   Will cover with silicone foam.  Dressing procedure/placement/frequency: Cleanse incision along cervical spine with NS and pat dry. Apply mepitel silicone contact layer.  Cover with foam and drape/TRAC pad.  Seal immediately achieved at 125 mmHg.  Change Monday and Thursday.  Will follow.  Darice Cooley MSN, RN, FNP-BC CWON Wound, Ostomy, Continence Nurse Outpatient [facility] [phone] Work cell phone:  [phone]

## 2024-05-09 NOTE — Evaluation (Signed)
 Physical Therapy Evaluation Patient Details Name: Joseph Joseph [name] MRN: [id number] DOB: 08/26/1980 Today's Date: 05/09/2024  History of Present Illness  Joseph Joseph [name] is a 43 yo male who presented with 4 days of back pain and difficulty walking.  CTA chest PE protocol showed right upper lobe segmental PE. MRI of the thoracic spine ultimately showed pathological T2 compression fracture. Thyroid ultrasound showed large left thyroid nodule. 11/12, IR performed US  guided left lobe thyroid nodule biopsy.  11/13: S/p posterior T1-3 instrumentation fusion T2 corpectomy for resection of tumor.  Thyroid biopsy showed papillary carcinoma. PMHx: none.  Clinical Impression   Pt presents with generalized LE weakness, impaired balance in sitting and Joseph [name], poor activity tolerance, and back pain post-op. Pt to benefit from acute PT to address deficits. Pt requiring mod +2 assist for bed mobility and transfer OOB to recliner, PT reviewed log roll for safe mobility into and out of bed. Anticipate pt would benefit from intensive post-acute rehabilitation, states he can stay with his aunt at d/c who can provide support. PT to progress mobility as tolerated, and will continue to follow acutely.      Positional dizziness with EOB and Joseph [name], SBP 140s throughout    If plan is discharge home, recommend the following: A lot of help with walking and/or transfers;A lot of help with bathing/dressing/bathroom   Can travel by private vehicle        Equipment Recommendations Other (comment);Rolling walker (2 wheels) (tbd)  Recommendations for Other Services       Functional Status Assessment Patient has had a recent decline in their functional status and demonstrates the ability to make significant improvements in function in a reasonable and predictable amount of time.     Precautions / Restrictions Precautions Precautions: Fall;Back Precaution Booklet Issued: No Recall of Precautions/Restrictions:  Intact Precaution/Restrictions Comments: painful R shoulder Restrictions Weight Bearing Restrictions Per Provider Order: No (11/17 Dr. Darnella said WBAT all extremeties via secure chat.)      Mobility  Bed Mobility Overal bed mobility: Needs Assistance Bed Mobility: Rolling, Sidelying to Sit Rolling: Mod assist Sidelying to sit: Mod assist, +2 for physical assistance, +2 for safety/equipment       General bed mobility comments: log roll, assist for trunk elevation and LE progression over EOB    Transfers Overall transfer level: Needs assistance Equipment used: Rolling walker (2 wheels) Transfers: Sit to/from Stand, Bed to chair/wheelchair/BSC Sit to Stand: Max assist, +2 physical assistance, +2 safety/equipment   Step pivot transfers: Mod assist, +2 physical assistance, +2 safety/equipment       General transfer comment: assist for power up, rise, steady, Once Joseph [name] tolerating marching in place and lateral stepping so progressed to step-pivot. Mod +2 for step pivot for truncal steadying, RUE management on RW, RW facilitation given RUE pain.    Ambulation/Gait         Gait velocity: decr        Stairs            Wheelchair Mobility     Tilt Bed    Modified Rankin (Stroke Patients Only)       Balance Overall balance assessment: History of Falls, Needs assistance Sitting-balance support: Feet supported Sitting balance-Leahy Scale: Fair     Joseph [name] balance support: Bilateral upper extremity supported, During functional activity, Reliant on assistive device for balance Joseph [name] balance-Leahy Scale: Poor  Pertinent Vitals/Pain Pain Assessment Pain Assessment: Faces Faces Pain Scale: Hurts even more Pain Location: R shoulder Pain Descriptors / Indicators: Constant, Discomfort, Grimacing, Guarding Pain Intervention(s): Limited activity within patient's tolerance, Monitored during session, Repositioned     Home Living Family/patient expects to be discharged to:: Private residence Living Arrangements: Other (Comment) Available Help at Discharge: Family;Available 24 hours/day Type of Home: House Home Access: Stairs to enter   [employer] of Steps: 1   Home Layout: One level Home Equipment: Cane - single point;Shower seat;BSC/3in1      Prior Function Prior Level of Function : Independent/Modified Independent;History of Falls (last six months)             Mobility Comments: indep, no AD, 1 recent fall due to BLEs giving out ADLs Comments: indep, drives     Extremity/Trunk Assessment   Upper Extremity Assessment Upper Extremity Assessment: Defer to OT evaluation RUE Deficits / Details: painful R shoulder with any movement or WBing, sling missing on eval. RUE Sensation: WNL RUE Coordination: decreased gross motor    Lower Extremity Assessment Lower Extremity Assessment: Generalized weakness (at least 3/5, formal MMT not assessed given recent back surgery and pt truncal weakness sitting EOB)    Cervical / Trunk Assessment Cervical / Trunk Assessment: Back Surgery  Communication   Communication Communication: No apparent difficulties    Cognition Arousal: Alert Behavior During Therapy: WFL for tasks assessed/performed   PT - Cognitive impairments: No apparent impairments                         Following commands: Intact       Cueing Cueing Techniques: Verbal cues     General Comments General comments (skin integrity, edema, etc.): LE edema distal>proximal    Exercises     Assessment/Plan    PT Assessment Patient needs continued PT services  PT Problem List Decreased strength;Decreased activity tolerance;Decreased balance;Decreased mobility;Decreased coordination;Pain;Decreased knowledge of precautions;Decreased safety awareness;Obesity;Cardiopulmonary status limiting activity;Decreased knowledge of use of DME       PT Treatment  Interventions DME instruction;Gait training;Functional mobility training;Therapeutic activities;Therapeutic exercise;Balance training;Neuromuscular re-education;Patient/family education    PT Goals (Current goals can be found in the Care Plan section)  Acute Rehab PT Goals Patient Stated Goal: to get stronger and go home PT Goal Formulation: With patient Time For Goal Achievement: 05/23/24 Potential to Achieve Goals: Good    Frequency Min 3X/week     Co-evaluation PT/OT/SLP Co-Evaluation/Treatment: Yes Reason for Co-Treatment: Complexity of the patient's impairments (multi-system involvement);To address functional/ADL transfers;For patient/therapist safety PT goals addressed during session: Mobility/safety with mobility;Balance OT goals addressed during session: ADL's and self-care       AM-PAC PT 6 Clicks Mobility  Outcome Measure Help needed turning from your back to your side while in a flat bed without using bedrails?: A Little Help needed moving from lying on your back to sitting on the side of a flat bed without using bedrails?: A Lot Help needed moving to and from a bed to a chair (including a wheelchair)?: A Lot Help needed Joseph [name] up from a chair using your arms (e.g., wheelchair or bedside chair)?: A Lot Help needed to walk in hospital room?: Total Help needed climbing 3-5 steps with a railing? : Total 6 Click Score: 11    End of Session Equipment Utilized During Treatment: Gait belt Activity Tolerance: Patient limited by fatigue Patient left: in chair;with call bell/phone within reach;with chair alarm set Nurse Communication: Mobility status  PT Visit Diagnosis: Unsteadiness on feet (R26.81);Other abnormalities of gait and mobility (R26.89);Difficulty in walking, not elsewhere classified (R26.2);Pain Pain - Right/Left: Right Pain - part of body: Shoulder;Arm    Time: 1351-1418 PT Time Calculation (min) (ACUTE ONLY): 27 min   Charges:   PT Evaluation $PT Eval  Low Complexity: 1 Low   PT General Charges $$ ACUTE PT VISIT: 1 Visit         Johana RAMAN, PT DPT Acute Rehabilitation Services Secure Chat Preferred  Office [phone]   Shandel Busic FORBES [place] 05/09/2024, 4:34 PM

## 2024-05-09 NOTE — Progress Notes (Signed)
 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 Underwent posterior T1-3 instrumentation fusion T2 corpectomy for resection of tumor (11/13)  LOS: 7 days    Plan: Decadron wean in place F/u surgical pathology JP drain dc'ed Wound vac on. Wound consult placed for routine changes Activity as tolerated. No brace needed. PTOT Diet as tolerated Ok for DVT ppx now Please hold therapeutic anticoagulation until 11/20 Will plan for removal of sutures in 3 weeks   Subjective: Legs improved.  Patient complaining of pre-existing right shoulder pain  Objective: Vital signs in last 24 hours: Temp:  [97.8 F (36.6 C)-98.7 F (37.1 C)] 97.8 F (36.6 C) (11/17 0734) Pulse Rate:  [75-92] 76 (11/17 0734) Resp:  [11-18] 11 (11/17 0734) BP: (128-169)/(58-70) 142/68 (11/17 0734) SpO2:  [93 %-97 %] 96 % (11/17 0734)  Intake/Output from previous day: 11/16 0701 - 11/17 0700 In: 1093 [P.O.:1080; I.V.:13] Out: 1560 [Urine:1500; Drains:60] Intake/Output this shift: Total I/O In: -  Out: 345 [Urine:315; Drains:30]  Exam: RUE: 5/5 grip LUE: 5/5 grip BLE: 3/5 IP. 4/5 quad, ham, DF, EHL, PF Subjective sensory level from mid thoracic down bilaterally improved since yesterday  Wound VAC   Removed.  Incision clean dry and intact  Lab Results: Recent Labs    05/08/24 0500 05/09/24 0551  WBC 21.5* 19.7*  HGB 11.0* 11.0*  HCT 32.7* 33.1*  PLT PLATELET CLUMPS NOTED ON SMEAR, UNABLE TO ESTIMATE PLATELET CLUMPS NOTED ON SMEAR, UNABLE TO ESTIMATE   BMET Recent Labs    05/07/24 0349  NA 135  K 4.1  CL 101  CO2 24  GLUCOSE 139*  BUN 14  CREATININE 0.78  CALCIUM 8.7DEWAINE Dorn JONELLE Darnella 05/09/2024, 8:47 AM]

## 2024-05-09 NOTE — Plan of Care (Signed)

## 2024-05-09 NOTE — Progress Notes (Signed)
 PROGRESS NOTE   Joseph [name]  FMW:[id number]    DOB: 25-Jul-1980    DOA: 04/27/2024  PCP: Joseph [name], No Pcp Per   I have briefly reviewed patients previous medical records in [facility].   Brief Hospital Course:  43 year old gentleman with no significant medical history presented to the hospital with about 4 days of back pain and difficulty walking, he was having some back pain issues for about 2 weeks.  CT scan of the lumbar spine was unrevealing.  CTA chest PE protocol showed right upper lobe segmental PE. He was started on Eliquis. Joseph [name] continued to have difficulty with mobility.  Multiple investigations were done.  MRI of the thoracic spine ultimately showed pathological T2 compression fracture. Thyroid ultrasound showed large left thyroid nodule.  He was transferred to [facility] from [place] for Neurosurgery.  11/12, IR performed US  guided left lobe thyroid nodule biopsy.  11/13: S/p posterior T1-3 instrumentation fusion T2 corpectomy for resection of tumor.  Thyroid biopsy showed papillary carcinoma, oncology consulted.   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  11/13: S/p posterior T1-3 instrumentation fusion T2 corpectomy for resection of tumor.  Extensive procedure.  Hemorrhagic tumor.  1500 mL EBL.  Got 2 units of PRBC across surgery.   JP drain being removed 11/17.  Wound VAC to stay. Recommends holding therapeutic anticoagulation for a week postop (start back 11/20) and okay to start DVT prophylaxis on 11/15.  I thought we had discussed with pharmacy and started this yesterday but it does not appear so.  Will start today. Thoracic tumor pathology remains pending.  Thyroid  biopsy pathology confirms papillary carcinoma.  Consulted oncology and input appreciated >need to follow thoracic tumor pathology to make sure that he does not have a second primary.  They will follow-up this week. Has defervesced without recurrence postop day 1 fever likely related to surgery and instrumentation. Therapies consulted.  Right upper lobe segmental PE Pulmonary artery hypertension Was on IV heparin bridge pending procedures prior to transitioning back to Eliquis.  See anticoagulation discussion above-to restart 11/20.  Starting DVT prophylaxis today. Given possible cancer diagnosis, anticoagulation will be prolonged.  Acute blood loss anemia As noted above, EBL 1500 mL during neurosurgical procedure and on 11/13 and got 2 units PRBC.  Hemoglobin dropped from 12.8 preop to 10.5 yesterday.  Hemoglobin stable in the 11 g range.  Thrombocytopenia Mild and stable,?  Related to IV heparin.  Platelet clumping on daily serial CBCs and counts not known.  Leukocytosis No clinical concern for infection at this time.  Suspected due to Decadron.  Continue to trend daily CBCs.  Right shoulder pain, chronic Pain predates his current surgery.  Denies any worsening since hospital admission.  Communicated with orthopedics PA 11/17 and recommends outpatient follow-up for suspected rotator cuff pathology.  PT evaluation.  Body mass index is 46.72 kg/m./Very morbid obesity class III Complicates care.  Outpatient follow-up.   DVT prophylaxis: Place and maintain sequential compression device Start: 05/01/24 1226     Code Status: Full Code:  Family Communication: None at bedside.   Disposition:  Immediate postop, recovering.  Awaiting pathology results for further course.     Consultants:   Neurosurgery Neurology Interventional radiology  Procedures:   As above Difficult LUE PICC line placement by  PICC team on 11/15.  Subjective:  Intrascapular pain improving, reported as 6/10 in  severity.  Chronic right-sided shoulder pain for several years, attributes this to football related injuries which he played in high school.  Chronic and pain unchanged since hospital admission.  Advised outpatient follow-up with orthopedics.  Objective:   Vitals:   05/08/24 2335 05/09/24 0349 05/09/24 0437 05/09/24 0734  BP: 128/70 (!) 130/58  (!) 142/68  Pulse: 92 87 86 76  Resp: 18 13 14 11   Temp: 98.7 F (37.1 C) 98.7 F (37.1 C)  97.8 F (36.6 C)  TempSrc: Oral Oral  Oral  SpO2: 96% 97% 95% 96%  Weight:      Height:        General exam: Young male, moderately built and morbidly obese lying comfortably supine in bed without distress.   Respiratory system: Clear to auscultation.  No increased work of breathing.  Stable Cardiovascular system: S1 & S2 heard, RRR. No JVD, murmurs, rubs, gallops or clicks. No pedal edema.  Telemetry personally reviewed: Sinus rhythm.  Stable Gastrointestinal system: Abdomen is nondistended, soft and nontender. No organomegaly or masses felt. Normal bowel sounds heard. Central nervous system: Alert and oriented.  No focal neurological deficits.   Extremities: Symmetric 4 x 5 power at least in all limbs.  Right shoulder without any acute findings externally.  Painful and restricted range of movements. Skin: No rashes, lesions or ulcers Psychiatry: Judgement and insight appear normal. Mood & affect appropriate.  Has surgical drain and wound VAC in place.  Left upper arm PICC line.   Data Reviewed:   I have personally reviewed following labs and imaging studies   CBC: Recent Labs  Lab 05/08/24 0500 05/09/24 0550 05/09/24 0551  WBC 21.5* 19.9* 19.7*  NEUTROABS  --  13.9*  --   HGB 11.0* 11.1* 11.0*  HCT 32.7* 33.3* 33.1*  MCV 90.6 90.5 90.7  PLT PLATELET CLUMPS NOTED ON SMEAR, UNABLE TO ESTIMATE PLATELET CLUMPS NOTED ON SMEAR, UNABLE TO ESTIMATE PLATELET CLUMPS NOTED ON SMEAR, UNABLE TO ESTIMATE    Basic Metabolic Panel: Recent Labs  Lab  05/04/24 0910 05/05/24 0948 05/05/24 1155 05/05/24 1409 05/06/24 0529 05/07/24 0349  NA 137 136 135 137 136 135  K 4.1 4.1 4.2 4.4 4.1 4.1  CL 101  --   --   --  101 101  CO2 25  --   --   --  24 24  GLUCOSE 112*  --   --   --  123* 139*  BUN 13  --   --   --  15 14  CREATININE 0.93  --   --   --  0.86 0.78  CALCIUM 9.0  --   --   --  8.8* 8.7*    Liver Function Tests: Recent Labs  Lab 05/07/24 0349  AST 21  ALT 29  ALKPHOS 46  BILITOT 1.2  PROT 6.8  ALBUMIN 3.1*     CBG: No results for input(s): GLUCAP in the last 168 hours.   Microbiology Studies:   Recent Results (from the past 240 hours)  Surgical pcr screen     Status: Abnormal   Collection Time: 05/04/24  6:45 PM   Specimen: Nasal Mucosa; Nasal Swab  Result Value Ref Range Status   MRSA, PCR NEGATIVE NEGATIVE Final   Staphylococcus aureus POSITIVE (A) NEGATIVE Final    Comment: (NOTE) The Xpert SA Assay (FDA approved for NASAL specimens in patients 67 years of age  and older), is one component of a comprehensive surveillance program. It is not intended to diagnose infection nor to guide or monitor treatment. Performed at [facility], [address] [place], [place] [address]     Radiology Studies:  No results found.   Scheduled Meds:    acetaminophen   1,000 mg Oral Q8H   Chlorhexidine  Gluconate Cloth  6 each Topical Daily   Chlorhexidine  Gluconate Cloth  6 each Topical Daily   cyclobenzaprine  10 mg Oral TID   [START ON 05/10/2024] dexamethasone  2 mg Oral Daily   Followed by   NOREEN ON 05/12/2024] dexamethasone  1 mg Oral Daily   methocarbamol  500 mg Oral TID   mupirocin ointment  1 Application Nasal BID   senna-docusate  1 tablet Oral BID   sodium chloride  flush  10-40 mL Intracatheter Q12H   sodium chloride  flush  3 mL Intravenous Q12H    Continuous Infusions:       LOS: 7 days     Trenda Mar, MD,  FACP, [facility], [facility], [facility]   [facility]  [facility]ician Advisor [place]      To contact the attending provider between 7A-7P or the covering provider during after hours 7P-7A, please log into the web site [email or web address] and access using universal [place] password for that web site. If you do not have the password, please call the hospital operator.  05/09/2024, 1:54 PM

## 2024-05-09 NOTE — Progress Notes (Signed)
 Initial Nutrition Assessment  DOCUMENTATION CODES:   Morbid obesity  INTERVENTION:  Encourage po intake  High protein diet for wound healing Ensure Max BID  MVI with minerals daily  Recommend increased bowel regimen as pt has not had BM since 11/10  *RD will sign off, pt presents with good po intake and appetite. No signs of malnutrition or weight loss, eating 75-100% of meals here.   NUTRITION DIAGNOSIS:   Increased nutrient needs related to post-op healing as evidenced by estimated needs.  GOAL:   Patient will meet greater than or equal to 90% of their needs   MONITOR:   PO intake, Supplement acceptance, Skin, I & O's, Labs, Weight trends  REASON FOR ASSESSMENT:   Consult Wound healing  ASSESSMENT:  43 y/o Male with PMH of morbid obesity, substance abuse, depression, anxiety who presents with back pain and difficulty walking found to have  pathologic T2 osteolytic fracture, high grade ESCC, progressive thoracic myelopathy, unknown metastatic primary seemingly thyroid vs pulm primary. Underwent posterior T1-3 instrumentation fusion T2 corpectomy for resection of tumor.  11/12 - s/p IR left thyroid nodule biopsy - Showed papillary carcinoma  11/13 - S/p posterior T1-3 instrumentation fusion T2 corpectomy for resection of tumor. 11/17 - Neck JP drain removed   Pt up in chair, with lunch on bedside tray. Pt reports slight decrease in appetite leading up to admission however prior has had good po intake. Pt reports he used to weight 375 lbs years ago but has since intentionally lost weight. Pt endorses recent weight gain. Typically consumes a breakfast of eggs, sausage, pancakes. Lunch of a cold cut sandwich with chips and a dinner of a [employer] hot dog or chicken sandwich. Drinks juice, Gatorade, and water.  Has had good po intake here eating 75-100% of meals. No fat or muscle depletions. Encouraged increased protein and for post op healing, provided examples of high protein  foods. Pt willing to try Ensure Max. Has chronic right shoulder pain. Oncology consulted possible metastatic cancer.   Admit weight: 143.5 kg Current weight: 143.5 kg      Wt Readings from Last 10 Encounters:  05/05/24 (!) 143.5 kg  06/27/20 131.5 kg  05/05/20 117.9 kg  03/31/18 90.7 kg  06/20/13 102.2 kg  06/06/13 98.4 kg  01/20/13 104.3 kg  04/23/12 108.9 kg  03/24/12 103.4 kg  09/22/11 107.5 kg   Drains/Lines: Wound vac neck 0 ml x 24 hours Neck JP drain 60 ml x 24 hours  Average Meal Intake: 11/9-11/16: 75-100% intake x 5 recorded meals  Nutritionally Relevant Medications: Scheduled Meds:  multivitamin with minerals  1 tablet Oral Daily   mupirocin ointment  1 Application Nasal BID   Ensure Max Protein  11 oz Oral BID   senna-docusate  1 tablet Oral BID   Labs Reviewed: No recent labs   NUTRITION - FOCUSED PHYSICAL EXAM:  Flowsheet Row Most Recent Value  Orbital Region No depletion  Upper Arm Region No depletion  Thoracic and Lumbar Region No depletion  Buccal Region No depletion  Temple Region No depletion  Clavicle Bone Region No depletion  Clavicle and Acromion Bone Region No depletion  Scapular Bone Region No depletion  Dorsal Hand No depletion  Patellar Region No depletion  Anterior Thigh Region No depletion  Posterior Calf Region No depletion  Edema (RD Assessment) None  Hair Reviewed  Eyes Reviewed  Mouth Reviewed  Skin Reviewed  Nails Reviewed    Diet Order:   Diet Order  Diet regular Room service appropriate? Yes; Fluid consistency: Thin  Diet effective now                   EDUCATION NEEDS:   Education needs have been addressed  Skin:  Skin Assessment: Skin Integrity Issues: Skin Integrity Issues:: Wound VAC, Incisions Wound Vac: Neck Incisions: Closed surgical incision neck  Last BM:  11/10 - type 4, large  Height:   Ht Readings from Last 1 Encounters:  05/05/24 5' 9 (1.753 m)    Weight:   Wt Readings  from Last 1 Encounters:  05/05/24 (!) 143.5 kg    Ideal Body Weight:  72.7 kg  BMI:  Body mass index is 46.72 kg/m.  Estimated Nutritional Needs:   Kcal:  2300-2500 kcal  Protein:  140-160 gm  Fluid:  >2L/day   Olivia Kenning, RD Registered Dietitian  See Amion for more information

## 2024-05-09 NOTE — Evaluation (Signed)
 Occupational Therapy Evaluation Patient Details Name: Joseph [name] MRN: [id number] DOB: Jan 14, 1981 Today's Date: 05/09/2024   History of Present Illness   Joseph [name] is a 43 yo male who presented with 4 days of back pain and difficulty walking.  CTA chest PE protocol showed right upper lobe segmental PE. MRI of the thoracic spine ultimately showed pathological T2 compression fracture. Thyroid ultrasound showed large left thyroid nodule. 11/12, IR performed US  guided left lobe thyroid nodule biopsy.  11/13: S/p posterior T1-3 instrumentation fusion T2 corpectomy for resection of tumor.  Thyroid biopsy showed papillary carcinoma. PMHx: none.     Clinical Impressions [name] was evaluated s/p the above admission list. He is indep at baseline. Upon evaluation the pt was limited by R shoulder pain, global weakness, dizziness with transfers (VSS), poor activity tolerance and impaired balance. Overall he needed mod A +2 for most aspects of mobility with RW. He needed assist with management of his R arm due to pain with any movement, sling not present, RN made aware. Due to the deficits listed below the pt also needs up to max A for LB ADLS and mod A fro UB ADLs. Pt will benefit from continued acute OT services and intensive inpatient follow up therapy, >3 hours/day after discharge.      If plan is discharge home, recommend the following:   A lot of help with walking and/or transfers;Two people to help with walking and/or transfers;A lot of help with bathing/dressing/bathroom;Two people to help with bathing/dressing/bathroom     Functional Status Assessment   Patient has had a recent decline in their functional status and demonstrates the ability to make significant improvements in function in a reasonable and predictable amount of time.     Equipment Recommendations   Other (comment)     Recommendations for Other Services   Rehab consult     Precautions/Restrictions    Precautions Precautions: Fall;Back Precaution Booklet Issued: No Recall of Precautions/Restrictions: Intact Precaution/Restrictions Comments: painful R shoulder Restrictions Weight Bearing Restrictions Per Provider Order: No (11/17 Dr. Darnella said WBAT all extremeties via secure chat.)     Mobility Bed Mobility Overal bed mobility: Needs Assistance Bed Mobility: Rolling, Sidelying to Sit Rolling: Mod assist Sidelying to sit: Mod assist, +2 for physical assistance, +2 for safety/equipment       General bed mobility comments: log roll    Transfers Overall transfer level: Needs assistance Equipment used: Rolling walker (2 wheels) Transfers: Sit to/from Stand, Bed to chair/wheelchair/BSC Sit to Stand: Max assist, +2 physical assistance, +2 safety/equipment     Step pivot transfers: Mod assist, +2 physical assistance, +2 safety/equipment            Balance Overall balance assessment: History of Falls, Needs assistance Sitting-balance support: Feet supported Sitting balance-Leahy Scale: Fair     Standing balance support: Bilateral upper extremity supported, During functional activity, Reliant on assistive device for balance Standing balance-Leahy Scale: Poor                             ADL either performed or assessed with clinical judgement   ADL Overall ADL's : Needs assistance/impaired Eating/Feeding: Independent;Sitting   Grooming: Set up;Sitting   Upper Body Bathing: Moderate assistance;Sitting   Lower Body Bathing: Maximal assistance;+2 for physical assistance;+2 for safety/equipment;Sit to/from stand   Upper Body Dressing : Maximal assistance;Sitting   Lower Body Dressing: Maximal assistance;+2 for physical assistance;+2 for safety/equipment;Sit to/from stand   Toilet Transfer:  Moderate assistance;+2 for physical assistance;+2 for safety/equipment;Stand-pivot   Toileting- Clothing Manipulation and Hygiene: Total assistance;Sit to/from stand        Functional mobility during ADLs: Moderate assistance;+2 for physical assistance;+2 for safety/equipment General ADL Comments: Limited by RUE pain, BLE weakness, dizziness with standing and poor activity tolerace     Vision Baseline Vision/History: 0 No visual deficits Vision Assessment?: No apparent visual deficits     Perception Perception: Not tested       Praxis Praxis: Not tested       Pertinent Vitals/Pain Pain Assessment Pain Assessment: Faces Faces Pain Scale: Hurts whole lot Pain Location: R shoulder Pain Descriptors / Indicators: Constant, Discomfort, Dull, Grimacing, Guarding Pain Intervention(s): Limited activity within patient's tolerance     Extremity/Trunk Assessment Upper Extremity Assessment Upper Extremity Assessment: RUE deficits/detail RUE Deficits / Details: painful R shoulder with any movement or WBing, sling missing on eval. RUE Sensation: WNL RUE Coordination: decreased gross motor   Lower Extremity Assessment Lower Extremity Assessment: Defer to PT evaluation   Cervical / Trunk Assessment Cervical / Trunk Assessment: Normal   Communication Communication Communication: No apparent difficulties   Cognition Arousal: Alert Behavior During Therapy: WFL for tasks assessed/performed Cognition: No apparent impairments     Following commands: Intact       Cueing  General Comments   Cueing Techniques: Verbal cues              Home Living Family/patient expects to be discharged to:: Private residence Living Arrangements: Other (Comment) Available Help at Discharge: Family;Available 24 hours/day Type of Home: House Home Access: Stairs to enter [employer] of Steps: 1   Home Layout: One level     Bathroom Shower/Tub: Producer, Television/film/video: Handicapped height     Home Equipment: Cane - single point;Shower seat;BSC/3in1          Prior Functioning/Environment Prior Level of Function :  Independent/Modified Independent;History of Falls (last six months)             Mobility Comments: indep, no AD, 1 recent fall due to BLEs giving out ADLs Comments: indep, drives    OT Problem List: Decreased strength;Decreased range of motion;Decreased activity tolerance;Impaired balance (sitting and/or standing);Decreased cognition;Decreased safety awareness;Decreased knowledge of use of DME or AE;Decreased knowledge of precautions   OT Treatment/Interventions: Self-care/ADL training;Therapeutic exercise;Energy conservation;DME and/or AE instruction;Therapeutic activities;Patient/family education;Balance training      OT Goals(Current goals can be found in the care plan section)   Acute Rehab OT Goals Patient Stated Goal: to get better OT Goal Formulation: With patient Time For Goal Achievement: 05/23/24 Potential to Achieve Goals: Good   OT Frequency:  Min 2X/week    Co-evaluation PT/OT/SLP Co-Evaluation/Treatment: Yes Reason for Co-Treatment: Complexity of the patient's impairments (multi-system involvement);To address functional/ADL transfers;For patient/therapist safety   OT goals addressed during session: ADL's and self-care      AM-PAC OT 6 Clicks Daily Activity     Outcome Measure Help from another person eating meals?: None Help from another person taking care of personal grooming?: A Little Help from another person toileting, which includes using toliet, bedpan, or urinal?: A Lot Help from another person bathing (including washing, rinsing, drying)?: A Lot Help from another person to put on and taking off regular upper body clothing?: A Lot Help from another person to put on and taking off regular lower body clothing?: A Lot 6 Click Score: 15   End of Session Equipment Utilized During Treatment: Rolling walker (2  wheels) Nurse Communication: Mobility status  Activity Tolerance: Patient limited by pain;Patient limited by fatigue Patient left: in  chair;with call bell/phone within reach  OT Visit Diagnosis: Unsteadiness on feet (R26.81);Other abnormalities of gait and mobility (R26.89);Muscle weakness (generalized) (M62.81);History of falling (Z91.81);Pain                Time: 1351-1418 OT Time Calculation (min): 27 min Charges:  OT General Charges $OT Visit: 1 Visit OT Evaluation $OT Eval Moderate Complexity: 1 Mod  Lucie Kendall, OTR/L Acute Rehabilitation Services Office [phone] Secure Chat Communication Preferred   Lucie JONETTA Kendall 05/09/2024, 2:31 PM

## 2024-05-10 ENCOUNTER — Other Ambulatory Visit: Payer: Self-pay

## 2024-05-10 ENCOUNTER — Other Ambulatory Visit: Payer: Self-pay | Admitting: Radiation Therapy

## 2024-05-10 DIAGNOSIS — C7802 Secondary malignant neoplasm of left lung: Secondary | ICD-10-CM

## 2024-05-10 DIAGNOSIS — K5901 Slow transit constipation: Secondary | ICD-10-CM

## 2024-05-10 DIAGNOSIS — C801 Malignant (primary) neoplasm, unspecified: Secondary | ICD-10-CM | POA: Diagnosis not present

## 2024-05-10 DIAGNOSIS — I2699 Other pulmonary embolism without acute cor pulmonale: Secondary | ICD-10-CM | POA: Diagnosis not present

## 2024-05-10 DIAGNOSIS — C73 Malignant neoplasm of thyroid gland: Secondary | ICD-10-CM

## 2024-05-10 DIAGNOSIS — C7801 Secondary malignant neoplasm of right lung: Secondary | ICD-10-CM | POA: Diagnosis not present

## 2024-05-10 DIAGNOSIS — C7951 Secondary malignant neoplasm of bone: Secondary | ICD-10-CM

## 2024-05-10 LAB — CBC
HCT: 31.8 % — ABNORMAL LOW (ref 39.0–52.0)
Hemoglobin: 10.4 g/dL — ABNORMAL LOW (ref 13.0–17.0)
MCH: 30 pg (ref 26.0–34.0)
MCHC: 32.7 g/dL (ref 30.0–36.0)
MCV: 91.6 fL (ref 80.0–100.0)
Platelets: UNDETERMINED K/uL (ref 150–400)
RBC: 3.47 MIL/uL — ABNORMAL LOW (ref 4.22–5.81)
RDW: 13.9 % (ref 11.5–15.5)
WBC: 20.3 K/uL — ABNORMAL HIGH (ref 4.0–10.5)
nRBC: 0 % (ref 0.0–0.2)

## 2024-05-10 LAB — SURGICAL PATHOLOGY

## 2024-05-10 LAB — APTT: aPTT: 30 s (ref 24–36)

## 2024-05-10 MED ORDER — POLYETHYLENE GLYCOL 3350 17 G PO PACK
17.0000 g | PACK | Freq: Two times a day (BID) | ORAL | Status: DC
  Administered 2024-05-11 – 2024-05-16 (×8): 17 g via ORAL
  Filled 2024-05-10 (×12): qty 1

## 2024-05-10 MED ORDER — BISACODYL 10 MG RE SUPP
10.0000 mg | Freq: Once | RECTAL | Status: AC
  Administered 2024-05-10: 10 mg via RECTAL
  Filled 2024-05-10: qty 1

## 2024-05-10 MED ORDER — SENNOSIDES-DOCUSATE SODIUM 8.6-50 MG PO TABS
2.0000 | ORAL_TABLET | Freq: Two times a day (BID) | ORAL | Status: DC
  Administered 2024-05-10 – 2024-05-16 (×12): 2 via ORAL
  Filled 2024-05-10 (×12): qty 2

## 2024-05-10 MED ORDER — BISACODYL 5 MG PO TBEC
5.0000 mg | DELAYED_RELEASE_TABLET | Freq: Every day | ORAL | Status: DC | PRN
  Administered 2024-05-10 – 2024-05-12 (×2): 5 mg via ORAL
  Filled 2024-05-10 (×2): qty 1

## 2024-05-10 MED ORDER — FLEET ENEMA RE ENEM: 1.0000 | ENEMA | Freq: Every day | RECTAL | Status: DC | PRN

## 2024-05-10 MED ORDER — POLYETHYLENE GLYCOL 3350 17 G PO PACK
17.0000 g | PACK | Freq: Every day | ORAL | Status: DC
  Administered 2024-05-10: 17 g via ORAL
  Filled 2024-05-10: qty 1

## 2024-05-10 NOTE — Progress Notes (Signed)
 Surgical pathology A. THORACIC TWO TUMOR, RESECTION:  - Metastatic papillary thyroid carcinoma, see comment   Referral to radiation oncology had been placed by neurosurgery., ENT and endocrinology referral placed.

## 2024-05-10 NOTE — Assessment & Plan Note (Signed)
 Right segmental Stable. No respiratory distress. Once cleared by neurosurgery, may resume anticoagulation. Recommend apixaban 5 mg twice daily.

## 2024-05-10 NOTE — Progress Notes (Signed)

## 2024-05-10 NOTE — Assessment & Plan Note (Signed)
 Will place referral to ENT, radiation oncology, endocrinology and nurse navigatory Evaluation for total thyroidectomy and RAI as outpatient

## 2024-05-10 NOTE — Assessment & Plan Note (Signed)
 Likely related to thyroid cancer metastases. This can be follow up as outpatient.

## 2024-05-10 NOTE — Assessment & Plan Note (Signed)
 Status post instrumentation and fusion and T1-T3, T2 corpectomy and resection of metastatic tumor

## 2024-05-10 NOTE — Plan of Care (Signed)

## 2024-05-10 NOTE — Progress Notes (Addendum)
 PROGRESS NOTE   Joseph [name]  FMW:[id number]    DOB: Feb 27, 1981    DOA: 04/27/2024  PCP: Patient, No Pcp Per   I have briefly reviewed patients previous medical records in [facility].   Brief Hospital Course:  43 year old gentleman with no significant medical history presented to the hospital with about 4 days of back pain and difficulty walking, he was having some back pain issues for about 2 weeks.  CT scan of the lumbar spine was unrevealing.  CTA chest PE protocol showed right upper lobe segmental PE. He was started on Eliquis. Patient continued to have difficulty with mobility.  Multiple investigations were done.  MRI of the thoracic spine ultimately showed pathological T2 compression fracture. Thyroid ultrasound showed large left thyroid nodule.  He was transferred to [facility] from [place] for Neurosurgery.  11/12, IR performed US  guided left lobe thyroid nodule biopsy.  11/13: S/p posterior T1-3 instrumentation fusion T2 corpectomy for resection of tumor.  Thyroid biopsy showed papillary carcinoma, oncology consulted and seen on 11/14 and plan to follow-up this week.   Assessment & Plan:   Metastatic papillary thyroid cancer Back pain and difficulty walking T2 pathological fracture with compressive myelopathy C6 lucent lesion, metastatic disease suspected Left thyroid enlargement Pulmonary nodules, suspected metastatic Neurosurgery consulted and postop management per them.  11/13: S/p posterior T1-3 instrumentation fusion T2 corpectomy for resection of tumor.  Extensive procedure.  Hemorrhagic tumor.  1500 mL EBL.  Got 2 units of PRBC across surgery.   JP drain removed.  Wound VAC to stay until time of discharge at which point, surgeons plan to apply a 7-day Prevena at discharge. Heparin DVT prophylaxis started 11/17.  Per neurosurgery, okay for therapeutic anticoagulation on 11/20 Thoracic tumor pathology remains pending today.  Thyroid biopsy pathology  confirms papillary carcinoma.  Consulted oncology and input 11/14 appreciated >need to follow thoracic tumor pathology to make sure that he does not have a second primary.  They will follow-up this week. Neurosurgery has communicated with radiation oncology for outpatient follow-up and they would like to have his wound checked prior to administration of radiation or systemic therapy. Therapies recommend AIR, consult to CIR screening RN placed.  Addendum: Thoracic tumor final pathology results as below.  Updated oncologist. FINAL MICROSCOPIC DIAGNOSIS:   A. THORACIC TWO TUMOR, RESECTION:  - Metastatic papillary thyroid carcinoma   Right upper lobe segmental PE Pulmonary artery hypertension Anticoagulation currently on hold due to immediate neurosurgery postop.  As per neurosurgery, okay to start therapeutic anticoagulation on 11/20.  Will likely need indefinite anticoagulation given cancer diagnosis.  Acute blood loss anemia As noted above, EBL 1500 mL during neurosurgical procedure 11/13 and got 2 units PRBC.  Hemoglobin overall stable for the last 3 days.  Follow CBC.  Thrombocytopenia Mild and stable,?  Related to IV heparin.  Platelet clumping on daily serial CBCs and counts not known.  Leukocytosis No clinical concern for infection at this time.  Suspected due to Decadron.  Continue to trend daily CBCs.  Right shoulder pain, chronic Pain predates his current surgery.  Denies any worsening since hospital admission.  Communicated with Orthopedics PA 11/17 and recommends outpatient follow-up for suspected rotator cuff pathology.  PT evaluation.  Constipation Aggressive bowel regimen initiated.  Started with Dulcolax suppository x 1, MiraLAX and Senokot-S.  If no effect with suppository, consider enema.  Body mass index is 46.72 kg/m./Very morbid obesity class III Complicates care.  Outpatient follow-up.   DVT prophylaxis:  heparin injection 5,000 Units Start: 05/09/24 1500 Place  and maintain sequential compression device Start: 05/01/24 1226     Code Status: Full Code:  Family Communication: None at bedside.   Disposition:  Immediate postop, recovering.  Awaiting pathology results for further course.  Awaiting CIR input.     Consultants:   Neurosurgery Neurology Interventional radiology Medical oncology  Procedures:   As above Difficult LUE PICC line placement by PICC team on 11/15.  Subjective:  Intrascapular pain improving rated at 6/10.  Right shoulder pain chronic and unchanged since hospital admission.  Indicates that he ambulated with the help of a walker and was up in chair for several hours yesterday.  No BM for almost a week.  No abdominal pain, nausea or vomiting.  Objective:   Vitals:   05/09/24 1940 05/09/24 2324 05/10/24 0736 05/10/24 1118  BP: 131/82 120/72 95/71 122/73  Pulse: 96 95 93 90  Resp: 15 15 15 13   Temp: 98.3 F (36.8 C) 98.8 F (37.1 C) 100 F (37.8 C) 98.5 F (36.9 C)  TempSrc: Joseph Joseph Joseph Joseph  SpO2: 96% 100% 100% 100%  Weight:      Height:        General exam: Young male, moderately built and morbidly obese lying comfortably supine in bed without distress.   Respiratory system: Clear to auscultation.  No increased work of breathing.  Stable Cardiovascular system: S1 & S2 heard, RRR. No JVD, murmurs, rubs, gallops or clicks. No pedal edema.  Telemetry personally reviewed: Sinus rhythm: Stable. Gastrointestinal system: Abdomen is nondistended, soft and nontender. No organomegaly or masses felt. Normal bowel sounds heard. Central nervous system: Alert and oriented.  No focal neurological deficits.   Extremities: Symmetric 4 x 5 power at least in all limbs.  Right shoulder without any acute findings externally.  Painful and restricted range of movements. Skin: No rashes, lesions or ulcers Psychiatry: Judgement and insight appear normal. Mood & affect appropriate.  Has surgical drain and wound VAC in place.  Left  upper arm PICC line.   Data Reviewed:   I have personally reviewed following labs and imaging studies   CBC: Recent Labs  Lab 05/09/24 0550 05/09/24 0551 05/10/24 0350  WBC 19.9* 19.7* 20.3*  NEUTROABS 13.9*  --   --   HGB 11.1* 11.0* 10.4*  HCT 33.3* 33.1* 31.8*  MCV 90.5 90.7 91.6  PLT PLATELET CLUMPS NOTED ON SMEAR, UNABLE TO ESTIMATE PLATELET CLUMPS NOTED ON SMEAR, UNABLE TO ESTIMATE PLATELET CLUMPS NOTED ON SMEAR, UNABLE TO ESTIMATE    Basic Metabolic Panel: Recent Labs  Lab 05/04/24 0910 05/05/24 0948 05/05/24 1155 05/05/24 1409 05/06/24 0529 05/07/24 0349  NA 137 136 135 137 136 135  K 4.1 4.1 4.2 4.4 4.1 4.1  CL 101  --   --   --  101 101  CO2 25  --   --   --  24 24  GLUCOSE 112*  --   --   --  123* 139*  BUN 13  --   --   --  15 14  CREATININE 0.93  --   --   --  0.86 0.78  CALCIUM 9.0  --   --   --  8.8* 8.7*    Liver Function Tests: Recent Labs  Lab 05/07/24 0349  AST 21  ALT 29  ALKPHOS 46  BILITOT 1.2  PROT 6.8  ALBUMIN 3.1*     CBG: No results for input(s): GLUCAP in the last  168 hours.   Microbiology Studies:   Recent Results (from the past 240 hours)  Surgical pcr screen     Status: Abnormal   Collection Time: 05/04/24  6:45 PM   Specimen: Nasal Mucosa; Nasal Swab  Result Value Ref Range Status   MRSA, PCR NEGATIVE NEGATIVE Final   Staphylococcus aureus POSITIVE (A) NEGATIVE Final    Comment: (NOTE) The Xpert SA Assay (FDA approved for NASAL specimens in patients 62 years of age and older), is one component of a comprehensive surveillance program. It is not intended to diagnose infection nor to guide or monitor treatment. Performed at [facility], [address] [place], [place] [address]     Radiology Studies:  No results found.   Scheduled Meds:    acetaminophen   1,000 mg Joseph Q8H   Chlorhexidine  Gluconate Cloth  6 each Topical Daily   cyclobenzaprine  10 mg Joseph TID   dexamethasone  2 mg Joseph Daily    Followed by   NOREEN ON 05/12/2024] dexamethasone  1 mg Joseph Daily   heparin injection (subcutaneous)  5,000 Units Subcutaneous Q8H   methocarbamol  500 mg Joseph TID   multivitamin with minerals  1 tablet Joseph Daily   polyethylene glycol  17 g Joseph Daily   Ensure Max Protein  11 oz Joseph BID   senna-docusate  1 tablet Joseph BID   sodium chloride  flush  10-40 mL Intracatheter Q12H   sodium chloride  flush  3 mL Intravenous Q12H    Continuous Infusions:       LOS: 8 days     Trenda Mar, MD,  FACP, [facility], [facility], [facility]   [facility] [place]      To contact the attending provider between 7A-7P or the covering provider during after hours 7P-7A, please log into the web site [email or web address] and access using universal [place] password for that web site. If you do not have the password, please call the hospital operator.  05/10/2024, 12:50 PM

## 2024-05-10 NOTE — Progress Notes (Signed)
 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 Underwent posterior T1-3 instrumentation fusion T2 corpectomy for resection of tumor (11/13). Thyroid biopsy with papillary carcinoma  LOS: 8 days    Plan: Decadron wean in place F/u surgical pathology Activity as tolerated. No brace needed. PTOT Diet as tolerated Ok for DVT ppx now [facility] for therapeutic anticoagulation on 11/20 I sent a message to radiation oncology for outpatient follow-up Please consult oncology service Will plan for removal of sutures 3 weeks postop. I would like to have his wound checked prior to administering radiation or systemic therapy Pt will wear wound VAC until it is time for discharge from hospital at which point we will apply a 7 day prevena that he can discharge with   Subjective: Pain tolerable. Pt got up and sat in chair for a few hours yesterday. Eating ok. No BM yet but feels like he will have one soon. Urinating ok. I told him that his thyroid biopsy came back as papillary carcinoma  Objective: Vital signs in last 24 hours: Temp:  [97.8 F (36.6 C)-100 F (37.8 C)] 100 F (37.8 C) (11/18 0736) Pulse Rate:  [90-96] 93 (11/18 0736) Resp:  [12-16] 15 (11/18 0736) BP: (95-165)/(71-91) 95/71 (11/18 0736) SpO2:  [95 %-100 %] 100 % (11/18 0736)  Intake/Output from previous day: 11/17 0701 - 11/18 0700 In: 760 [P.O.:757; I.V.:3] Out: 895 [Urine:865; Drains:30] Intake/Output this shift: No intake/output data recorded.  Exam: RUE: 5/5 grip LUE: 5/5 grip BLE: 3/5 IP. 4/5 quad, ham, DF, EHL, PF Improved sensation BLE compared to preop Wound vac in place  Lab Results: Recent Labs    05/09/24 0551 05/10/24 0350  WBC 19.7* 20.3*  HGB 11.0* 10.4*  HCT 33.1* 31.8*  PLT PLATELET CLUMPS NOTED ON SMEAR, UNABLE TO ESTIMATE PLATELET  CLUMPS NOTED ON SMEAR, UNABLE TO ESTIMATE   BMET No results for input(s): NA, K, CL, CO2, GLUCOSE, BUN, CREATININE, CALCIUM in the last 72 hours.      Joseph [name] 05/10/2024, 10:20 AM]

## 2024-05-10 NOTE — Progress Notes (Signed)
 Joseph [name]   DOB:Dec 05, 1980   FM#:[id number]    ASSESSMENT & PLAN:  43 y.o.male with past medical history of anxiety, asthma, narcotic and alcohol use consulted for spinal metastases and thyroid mass.   FNA showed papillary carcinoma. I reviewed the imaging. Resection of spinal mass with final pathology came back today as papillary thyroid carcinoma. Discussed with patient, and cousin at bedside on the pathology and diagnosis. He has good PS and young age with metastases, recommend total thyroidectomy and evaluation for RAI by radiation oncology after he recovered from this hospitalization. Total thyroidectomy with R0 resection improved survival.  I will communicate with H&N nurse navigator to connect to radiation oncology and ENT for follow up. Will also refer to Endocrinology as outpatient.  Assessment & Plan Papillary carcinoma (HCC) Will place referral to ENT, radiation oncology, endocrinology and nurse navigatory Evaluation for total thyroidectomy and RAI as outpatient Thoracic spine fracture (HCC) Status post instrumentation and fusion and T1-T3, T2 corpectomy and resection of metastatic tumor Acute pulmonary embolism (HCC) Right segmental Stable. No respiratory distress. Once cleared by neurosurgery, may resume anticoagulation. Recommend apixaban 5 mg twice daily. Pulmonary nodules Likely related to thyroid cancer metastases. This can be follow up as outpatient. Metastatic cancer to spine [facility]) Status post instrumentation and fusion and T1-T3, T2 corpectomy and resection of metastatic tumor  Discharge planning Ok to from oncology standpoint to discharge. Please give anticoagulation when patient is ready for discharge when cleared by Neurosurgery.  All questions were answered.    Thank you for the consult. Please call if any questions.  Joseph JAYSON Chihuahua, MD 05/10/2024 9:12 PM  Subjective:  Joseph [name] reports feeling better. No neck mass or new symptoms. Getting up with  walker. No chest pain, stomach pain.  Some constipation from pain medication he is working on. Cousin at bedside.  Objective:  Vitals:   05/10/24 1849 05/10/24 1935  BP: 125/76 117/73  Pulse: 92 95  Resp: 14 14  Temp:  98.1 F (36.7 C)  SpO2: 98% 91%     Intake/Output Summary (Last 24 hours) at 05/10/2024 2112 Last data filed at 05/10/2024 1800 Gross per 24 hour  Intake 560 ml  Output 825 ml  Net -265 ml    GENERAL: alert, no distress and comfortable Musculoskeletal: no lower extremity edema NEURO: alert with fluent speech, no focal motor deficits   Labs:  Recent Labs    04/28/24 0000 04/29/24 0516 05/04/24 0910 05/05/24 0948 05/05/24 1409 05/06/24 0529 05/07/24 0349  NA  --    < > 137   < > 137 136 135  K  --    < > 4.1   < > 4.4 4.1 4.1  CL  --    < > 101  --   --  101 101  CO2  --    < > 25  --   --  24 24  GLUCOSE  --    < > 112*  --   --  123* 139*  BUN  --    < > 13  --   --  15 14  CREATININE  --    < > 0.93  --   --  0.86 0.78  CALCIUM  --    < > 9.0  --   --  8.8* 8.7*  GFRNONAA  --    < > >60  --   --  >60 >60  PROT 7.9  --   --   --   --   --  6.8  ALBUMIN  3.7  --   --   --   --   --  3.1*  AST 28  --   --   --   --   --  21  ALT 43  --   --   --   --   --  29  ALKPHOS 98  --   --   --   --   --  46  BILITOT 0.4  --   --   --   --   --  1.2  BILIDIR 0.2  --   --   --   --   --   --   IBILI 0.3  --   --   --   --   --   --    < > = values in this interval not displayed.    Studies:  I personally review his relevant imaging.

## 2024-05-11 DIAGNOSIS — C73 Malignant neoplasm of thyroid gland: Secondary | ICD-10-CM | POA: Diagnosis not present

## 2024-05-11 DIAGNOSIS — S22029S Unspecified fracture of second thoracic vertebra, sequela: Secondary | ICD-10-CM | POA: Diagnosis not present

## 2024-05-11 DIAGNOSIS — I2699 Other pulmonary embolism without acute cor pulmonale: Secondary | ICD-10-CM | POA: Diagnosis not present

## 2024-05-11 DIAGNOSIS — G8929 Other chronic pain: Secondary | ICD-10-CM

## 2024-05-11 DIAGNOSIS — D649 Anemia, unspecified: Secondary | ICD-10-CM

## 2024-05-11 DIAGNOSIS — Z515 Encounter for palliative care: Secondary | ICD-10-CM

## 2024-05-11 DIAGNOSIS — M25511 Pain in right shoulder: Secondary | ICD-10-CM

## 2024-05-11 LAB — BASIC METABOLIC PANEL WITH GFR
Anion gap: 13 (ref 5–15)
BUN: 15 mg/dL (ref 6–20)
CO2: 23 mmol/L (ref 22–32)
Calcium: 8.1 mg/dL — ABNORMAL LOW (ref 8.9–10.3)
Chloride: 97 mmol/L — ABNORMAL LOW (ref 98–111)
Creatinine, Ser: 0.74 mg/dL (ref 0.61–1.24)
GFR, Estimated: >60 mL/min (ref >60)
Glucose, Bld: 158 mg/dL — ABNORMAL HIGH (ref 70–99)
Potassium: 4 mmol/L (ref 3.5–5.1)
Sodium: 133 mmol/L — ABNORMAL LOW (ref 135–145)

## 2024-05-11 LAB — CBC
HCT: 30.9 % — ABNORMAL LOW (ref 39.0–52.0)
HCT: 29.8 % — ABNORMAL LOW (ref 39.0–52.0)
Hemoglobin: 10.1 g/dL — ABNORMAL LOW (ref 13.0–17.0)
Hemoglobin: 9.8 g/dL — ABNORMAL LOW (ref 13.0–17.0)
MCH: 30.1 pg (ref 26.0–34.0)
MCH: 30.4 pg (ref 26.0–34.0)
MCHC: 32.7 g/dL (ref 30.0–36.0)
MCHC: 32.9 g/dL (ref 30.0–36.0)
MCV: 92 fL (ref 80.0–100.0)
MCV: 92.5 fL (ref 80.0–100.0)
Platelets: UNDETERMINED K/uL (ref 150–400)
Platelets: UNDETERMINED K/uL (ref 150–400)
RBC: 3.36 MIL/uL — ABNORMAL LOW (ref 4.22–5.81)
RBC: 3.22 MIL/uL — ABNORMAL LOW (ref 4.22–5.81)
RDW: 14 % (ref 11.5–15.5)
RDW: 14.1 % (ref 11.5–15.5)
WBC: 19.7 K/uL — ABNORMAL HIGH (ref 4.0–10.5)
WBC: 19.3 K/uL — ABNORMAL HIGH (ref 4.0–10.5)
nRBC: 0 % (ref 0.0–0.2)
nRBC: 0 % (ref 0.0–0.2)

## 2024-05-11 LAB — APTT
aPTT: 38 s — ABNORMAL HIGH (ref 24–36)
aPTT: 34 s (ref 24–36)

## 2024-05-11 LAB — PROTIME-INR
INR: 1.2 (ref 0.8–1.2)
Prothrombin Time: 15.8 s — ABNORMAL HIGH (ref 11.4–15.2)

## 2024-05-11 MED ORDER — OXYCODONE HCL 5 MG PO TABS
10.0000 mg | ORAL_TABLET | ORAL | Status: DC | PRN
  Administered 2024-05-11 – 2024-05-12 (×5): 15 mg via ORAL
  Filled 2024-05-11 (×5): qty 3

## 2024-05-11 MED ORDER — OXYCODONE HCL ER 15 MG PO T12A
30.0000 mg | EXTENDED_RELEASE_TABLET | Freq: Two times a day (BID) | ORAL | Status: DC
Start: 2024-05-11 — End: 2024-05-12
  Administered 2024-05-11 – 2024-05-12 (×2): 30 mg via ORAL
  Filled 2024-05-11 (×2): qty 2

## 2024-05-11 NOTE — Progress Notes (Signed)
 PROGRESS NOTE   Joseph [name]  FMW:[id number]    DOB: 1980/12/24    DOA: 04/27/2024  PCP: Patient, No Pcp Per   I have briefly reviewed patients previous medical records in [facility].   Brief Hospital Course:  43 year old gentleman with no significant medical history presented to the hospital with about 4 days of back pain and difficulty walking, he was having some back pain issues for about 2 weeks.  CT scan of the lumbar spine was unrevealing.  CTA chest PE protocol showed right upper lobe segmental PE. He was started on Eliquis. Patient continued to have difficulty with mobility.  Multiple investigations were done.  MRI of the thoracic spine ultimately showed pathological T2 compression fracture. Thyroid ultrasound showed large left thyroid nodule.  He was transferred to [facility] from [place] for Neurosurgery.  11/12, IR performed US  guided left lobe thyroid nodule biopsy.  11/13: S/p posterior T1-3 instrumentation fusion T2 corpectomy for resection of tumor.  Thyroid biopsy showed papillary carcinoma, oncology consulted and seen on 11/14 and plan to follow-up this week.   Assessment & Plan:   Metastatic papillary thyroid cancer Back pain and difficulty walking T2 pathological fracture with compressive myelopathy C6 lucent lesion, metastatic disease suspected Left thyroid enlargement Pulmonary nodules, suspected metastatic Neurosurgery consulted and postop management per them.  11/13: S/p posterior T1-3 instrumentation fusion T2 corpectomy for resection of tumor.  Extensive procedure.  Hemorrhagic tumor.  1500 mL EBL.  Got 2 units of PRBC across surgery.   JP drain removed.  Wound VAC to stay until time of discharge at which point, surgeons plan to apply a 7-day Prevena at discharge. Heparin DVT prophylaxis started 11/17.  Per neurosurgery, okay for therapeutic anticoagulation on 11/20.  They recommend starting with heparin infusion for 48 to 48 hours and then  transition to apixaban if no bleeding complications. Thyroid biopsy pathology confirms papillary carcinoma.   Patient seen by medical oncology.  They have placed referral to ENT and endocrinology.  Radiation oncology referral was placed by neurosurgery.  All of this will be accomplished in the outpatient setting. Seen by therapy send they recommend inpatient rehabilitation. Dexamethasone to be weaned off gradually.  Right upper lobe segmental PE Pulmonary artery hypertension Anticoagulation currently on hold due to immediate neurosurgery postop.  As per neurosurgery, okay to start therapeutic anticoagulation on 11/20.  Will likely need indefinite anticoagulation given cancer diagnosis.  Acute blood loss anemia As noted above, EBL 1500 mL during neurosurgical procedure 11/13 and got 2 units PRBC.  Hemoglobin stable for the last several days.  Thrombocytopenia Platelet clumping on daily serial CBCs and counts not known.  Leukocytosis No clinical concern for infection at this time.  Suspected due to Decadron.  Remains afebrile.  Right shoulder pain, chronic Pain predates his current surgery.  Denies any worsening since hospital admission.  Communicated with Orthopedics PA 11/17 and recommends outpatient follow-up for suspected rotator cuff pathology.  PT evaluation.  Constipation Aggressive bowel regimen initiated.  Started with Dulcolax suppository x 1, MiraLAX and Senokot-S.  If no effect with suppository, consider enema.  Body mass index is 46.72 kg/m./Very morbid obesity class III Complicates care.  Outpatient follow-up.   DVT prophylaxis: Subcutaneous heparin   Code Status: Full Code:  Family Communication: None at bedside.   Disposition: Inpatient rehabilitation is being pursued.   Consultants:   Neurosurgery Neurology Interventional radiology Medical oncology  Procedures:   As above Difficult LUE PICC line placement by PICC team  on 11/15.  Subjective:  No new  complaints offered.  Able to move his extremities though he is weak.  He understands the plan regarding his thyroid cancer.  Objective:   Vitals:   05/10/24 1935 05/10/24 2334 05/11/24 0249 05/11/24 0732  BP: 117/73 (!) 115/56 (!) 146/61 124/67  Pulse: 95 99 98 96  Resp: 14 15 11 13   Temp: 98.1 F (36.7 C) 99.7 F (37.6 C) 99.1 F (37.3 C) 98.8 F (37.1 C)  TempSrc: Oral Oral Oral Oral  SpO2: 91% 94% 93% 92%  Weight:      Height:        General appearance: Awake alert.  In no distress Resp: Clear to auscultation bilaterally.  Normal effort Cardio: S1-S2 is normal regular.  No S3-S4.  No rubs murmurs or bruit GI: Abdomen is soft.  Nontender nondistended.  Bowel sounds are present normal.  No masses organomegaly Extremities: No edema.  Moves extremities Neurologic: Alert and oriented x3.  No focal neurological deficits.     Data Reviewed:     CBC: Recent Labs  Lab 05/09/24 0550 05/09/24 0551 05/10/24 0350 05/11/24 0411  WBC 19.9* 19.7* 20.3* 19.7*  NEUTROABS 13.9*  --   --   --   HGB 11.1* 11.0* 10.4* 10.1*  HCT 33.3* 33.1* 31.8* 30.9*  MCV 90.5 90.7 91.6 92.0  PLT PLATELET CLUMPS NOTED ON SMEAR, UNABLE TO ESTIMATE PLATELET CLUMPS NOTED ON SMEAR, UNABLE TO ESTIMATE PLATELET CLUMPS NOTED ON SMEAR, UNABLE TO ESTIMATE PLATELET CLUMPS NOTED ON SMEAR, UNABLE TO ESTIMATE    Basic Metabolic Panel: Recent Labs  Lab 05/05/24 0948 05/05/24 1155 05/05/24 1409 05/06/24 0529 05/07/24 0349  NA 136 135 137 136 135  K 4.1 4.2 4.4 4.1 4.1  CL  --   --   --  101 101  CO2  --   --   --  24 24  GLUCOSE  --   --   --  123* 139*  BUN  --   --   --  15 14  CREATININE  --   --   --  0.86 0.78  CALCIUM  --   --   --  8.8* 8.7*    Liver Function Tests: Recent Labs  Lab 05/07/24 0349  AST 21  ALT 29  ALKPHOS 46  BILITOT 1.2  PROT 6.8  ALBUMIN 3.1*     Microbiology Studies:   Recent Results (from the past 240 hours)  Surgical pcr screen     Status: Abnormal    Collection Time: 05/04/24  6:45 PM   Specimen: Nasal Mucosa; Nasal Swab  Result Value Ref Range Status   MRSA, PCR NEGATIVE NEGATIVE Final   Staphylococcus aureus POSITIVE (A) NEGATIVE Final    Comment: (NOTE) The Xpert SA Assay (FDA approved for NASAL specimens in patients 48 years of age and older), is one component of a comprehensive surveillance program. It is not intended to diagnose infection nor to guide or monitor treatment. Performed at [facility], [address] [place], [place] [address]     Radiology Studies:  No results found.   Scheduled Meds:    acetaminophen   1,000 mg Oral Q8H   Chlorhexidine  Gluconate Cloth  6 each Topical Daily   cyclobenzaprine  10 mg Oral TID   [START ON 05/12/2024] dexamethasone  1 mg Oral Daily   heparin injection (subcutaneous)  5,000 Units Subcutaneous Q8H   methocarbamol  500 mg Oral TID   multivitamin with minerals  1 tablet Oral Daily   polyethylene glycol  17 g Oral BID   Ensure Max Protein  11 oz Oral BID   senna-docusate  2 tablet Oral BID   sodium chloride  flush  10-40 mL Intracatheter Q12H   sodium chloride  flush  3 mL Intravenous Q12H    Continuous Infusions:     LOS: 9 days   Joette Pebbles, MD,    To contact the attending provider between 7A-7P or the covering provider during after hours 7P-7A, please log into the web site [email or web address] and access using universal [place] password for that web site. If you do not have the password, please call the hospital operator.  05/11/2024, 11:10 AM

## 2024-05-11 NOTE — Progress Notes (Signed)
 Occupational Therapy Treatment Patient Details Name: Joseph [name] MRN: [id number] DOB: [birth year] Today's Date: 05/11/2024   History of present illness Joseph [name] is a 43 yo male who presented with 4 days of back pain and difficulty walking.  CTA chest PE protocol showed right upper lobe segmental PE. MRI of the thoracic spine ultimately showed pathological T2 compression fracture. Thyroid  ultrasound showed large left thyroid  nodule. 11/12, IR performed US  guided left lobe thyroid  nodule biopsy.  11/13: S/p posterior T1-3 instrumentation fusion T2 corpectomy for resection of tumor.  Thyroid  biopsy showed papillary carcinoma. PMHx: none.   OT comments  Pt progressing toward established OT goals. Premedicated for session with IV dilaudid  after earlier PT session in which PT reports severely pain limited. Pt very guarded during mobility predominantly due to R shoulder and back pain. Pt however, eager and willing to attempt mobility. Needing mod A for bed mobility and mod A +2 for STS transfer with stedy. Diaphoretic with STS transfer (saturated), but VSS. Moved to chair with BLE up and RUE positioned on blankets for pain management. OT to continue to follow. Due to significant functional decline from PLOF recommending intensive multidisciplinary rehabilitation >3 hours/day to optimize safety and independence in ADL.        If plan is discharge home, recommend the following:  A lot of help with walking and/or transfers;Two people to help with walking and/or transfers;A lot of help with bathing/dressing/bathroom;Two people to help with bathing/dressing/bathroom   Equipment Recommendations  Other (comment) (defer)    Recommendations for Other Services Rehab consult    Precautions / Restrictions Precautions Precautions: Fall;Back Precaution Booklet Issued: No Recall of Precautions/Restrictions: Intact Precaution/Restrictions Comments: painful R shoulder Restrictions Weight Bearing  Restrictions Per Provider Order: No Other Position/Activity Restrictions: per chart review, cleared by Dr. Darnella ENGEL 11/17       Mobility Bed Mobility Overal bed mobility: Needs Assistance Bed Mobility: Supine to Sit     Supine to sit: Mod assist (with HOB elevated, turned OOB to optimize maintenance of back precautions as pt reports too painful to roll onto R shoulder)          Transfers Overall transfer level: Needs assistance Equipment used: Ambulation equipment used Transfers: Sit to/from Stand, Bed to chair/wheelchair/BSC Sit to Stand: Mod assist, +2 physical assistance, +2 safety/equipment     Step pivot transfers: Mod assist, +2 physical assistance, +2 safety/equipment       Transfer via Lift Equipment: Stedy   Balance Overall balance assessment: History of Falls, Needs assistance Sitting-balance support: Feet supported Sitting balance-Leahy Scale: Fair     Standing balance support: Bilateral upper extremity supported, During functional activity, Reliant on assistive device for balance Standing balance-Leahy Scale: Poor                             ADL either performed or assessed with clinical judgement   ADL Overall ADL's : Needs assistance/impaired Eating/Feeding: Independent;Sitting Eating/Feeding Details (indicate cue type and reason): to drink from cup with straw with LUE                     Toilet Transfer: Moderate assistance;+2 for physical assistance;+2 for safety/equipment Toilet Transfer Details (indicate cue type and reason): stedy this session secondary to pt being very pain limited         Functional mobility during ADLs: Moderate assistance;+2 for physical assistance;+2 for safety/equipment General ADL Comments: Limited by RUE pain, BLE  weakness, dizziness with standing and poor activity tolerace    Extremity/Trunk Assessment Upper Extremity Assessment Upper Extremity Assessment: RUE deficits/detail RUE Deficits /  Details: painful R shoulder with any movement or WBing, no sling present. Plans for shoulder MRI at outpatient follow up?   Lower Extremity Assessment Lower Extremity Assessment: Generalized weakness        Vision   Vision Assessment?: No apparent visual deficits   Perception Perception Perception: Not tested   Praxis Praxis Praxis: Not tested   Communication Communication Communication: No apparent difficulties   Cognition Arousal: Alert Behavior During Therapy: WFL for tasks assessed/performed Cognition: No apparent impairments                               Following commands: Intact        Cueing   Cueing Techniques: Verbal cues  Exercises      Shoulder Instructions       General Comments      Pertinent Vitals/ Pain       Pain Assessment Pain Assessment: Faces Faces Pain Scale: Hurts whole lot Pain Location: R shoulder>back Pain Descriptors / Indicators: Constant, Discomfort, Grimacing, Guarding Pain Intervention(s): Limited activity within patient's tolerance, Monitored during session  Home Living                                          Prior Functioning/Environment              Frequency  Min 2X/week        Progress Toward Goals  OT Goals(current goals can now be found in the care plan section)  Progress towards OT goals: Progressing toward goals  Acute Rehab OT Goals OT Goal Formulation: With patient Time For Goal Achievement: 05/23/24 Potential to Achieve Goals: Good ADL Goals Pt Will Perform Grooming: with set-up;sitting Pt Will Perform Upper Body Bathing: with min assist;sitting Pt Will Transfer to Toilet: stand pivot transfer;bedside commode;with min assist;with +2 assist Pt Will Perform Toileting - Clothing Manipulation and hygiene: with mod assist;sitting/lateral leans;sit to/from stand Additional ADL Goal #1: Pt to complete bed mobility with Min A in prep for EOB/OOB ADLs  Plan       Co-evaluation                 AM-PAC OT 6 Clicks Daily Activity     Outcome Measure   Help from another person eating meals?: None Help from another person taking care of personal grooming?: A Little Help from another person toileting, which includes using toliet, bedpan, or urinal?: A Lot Help from another person bathing (including washing, rinsing, drying)?: A Lot Help from another person to put on and taking off regular upper body clothing?: A Lot Help from another person to put on and taking off regular lower body clothing?: A Lot 6 Click Score: 15    End of Session Equipment Utilized During Treatment: Other (comment) (stedy)  OT Visit Diagnosis: Unsteadiness on feet (R26.81);Other abnormalities of gait and mobility (R26.89);Muscle weakness (generalized) (M62.81);History of falling (Z91.81);Pain Pain - Right/Left: Right Pain - part of body: Shoulder   Activity Tolerance Patient limited by pain;Patient limited by fatigue   Patient Left in chair;with call bell/phone within reach   Nurse Communication Mobility status        Time: 8785-8754 OT Time Calculation (min): 31 min  Charges: OT General Charges $OT Visit: 1 Visit OT Treatments $Therapeutic Activity: 23-37 mins  Elma JONETTA Lebron FREDERICK, OTR/L [facility] Acute Rehabilitation Office: [phone]   Elma JONETTA Lebron 05/11/2024, 3:32 PM

## 2024-05-11 NOTE — Plan of Care (Signed)

## 2024-05-11 NOTE — Consult Note (Addendum)
 Physical Medicine and Rehabilitation Consult Reason for Consult: Deficits s/p spinal surgery Referring Physician: Joette Pebbles, MD   HPI: Joseph [name] is a 43 y.o. male with no significant PMH, who presented to the hospital with about 4 days of back pain and difficulty walking. He was having some back pain for 2 weeks.CT scan of the lumbar spine was unrevealing. CTA chest showed PE and he was started on Eliquis. He continues to have difficulty with mobility. MRI of th thoracic spine showed T2 compression fracture. Thyroid US  showed large left thyroid nodule. He was transferred to [facility] from [facility] for surgery on 11/12. IR performed an US  guided left lobe thyroid nodule biopsy. On 11/13 he underwent posterior T-1-3 instrumentation fusion T2 corpectomy for resection of tumor. Thyroid biopsy showed papillary carcinoma. Oncology was consulted and he was seen on 11/14. Physical Medicine & Rehabilitation was consulted to assess candidacy for CIR.      Home: Home Living Family/patient expects to be discharged to:: Private residence Living Arrangements: Other (Comment) Available Help at Discharge: Family, Available 24 hours/day Type of Home: House Home Access: Stairs to enter [employer] of Steps: 1 Home Layout: One level Bathroom Shower/Tub: Health Visitor: Handicapped height Home Equipment: Medical Laboratory Scientific Officer - single point, Information systems manager, BSC/3in1 Additional Comments: pt currently in second story hotel with elevator  Functional History: Prior Function Prior Level of Function : Independent/Modified Independent, History of Falls (last six months) Mobility Comments: indep, no AD, 1 recent fall due to BLEs giving out ADLs Comments: indep, drives Functional Status:  Mobility: Bed Mobility Overal bed mobility: Needs Assistance Bed Mobility: Rolling, Sidelying to Sit Rolling: Mod assist Sidelying to sit: Mod assist, +2 for physical assistance, +2 for  safety/equipment Supine to sit: Mod assist, +2 for safety/equipment, +2 for physical assistance, HOB elevated, Used rails General bed mobility comments: log roll, assist for trunk elevation and LE progression over EOB Transfers Overall transfer level: Needs assistance Equipment used: Rolling walker (2 wheels) Transfers: Sit to/from Stand, Bed to chair/wheelchair/BSC Sit to Stand: Max assist, +2 physical assistance, +2 safety/equipment Bed to/from chair/wheelchair/BSC transfer type:: Step pivot Step pivot transfers: Mod assist, +2 physical assistance, +2 safety/equipment Transfer via Lift Equipment: Stedy General transfer comment: assist for power up, rise, steady, Once standing tolerating marching in place and lateral stepping so progressed to step-pivot. Mod +2 for step pivot for truncal steadying, RUE management on RW, RW facilitation given RUE pain. Ambulation/Gait Ambulation/Gait assistance: Min assist Gait Distance (Feet): 10 Feet Assistive device: Rolling walker (2 wheels) Gait Pattern/deviations: Ataxic, Trunk flexed, Knees buckling General Gait Details: pt exhibits an erratic, ataxic like gait pattern with noted B LE instability and poor motor processing and planing R > L and varying stride lengths with reports of fatigue and recliner close Gait velocity: decr    ADL: ADL Overall ADL's : Needs assistance/impaired Eating/Feeding: Independent, Sitting Eating/Feeding Details (indicate cue type and reason): using L hand to self feed w/ some difficulty Grooming: Set up, Sitting Upper Body Bathing: Moderate assistance, Sitting Lower Body Bathing: Maximal assistance, +2 for physical assistance, +2 for safety/equipment, Sit to/from stand Upper Body Dressing : Maximal assistance, Sitting Lower Body Dressing: Maximal assistance, +2 for physical assistance, +2 for safety/equipment, Sit to/from stand Lower Body Dressing Details (indicate cue type and reason): assist to don underwear around  feet, +2 to stand and then assist to pull up over bottom in standing Toilet Transfer: Moderate assistance, +2 for physical assistance, +2 for safety/equipment,  Stand-pivot Toileting- Architect and Hygiene: Total assistance, Sit to/from stand Functional mobility during ADLs: Moderate assistance, +2 for physical assistance, +2 for safety/equipment General ADL Comments: Limited by RUE pain, BLE weakness, dizziness with standing and poor activity tolerace  Cognition: Cognition Orientation Level: Oriented X4 Cognition Arousal: Alert Behavior During Therapy: WFL for tasks assessed/performed   ROS +decreased sensation in bilateral feet Past Medical History:  Diagnosis Date   Anxiety    Asthma    as a child   Depression    Narcotic abuse (HCC)    Pulmonary nodules 04/29/2024   Past Surgical History:  Procedure Laterality Date   CLOSED REDUCTION FINGER WITH PERCUTANEOUS PINNING Right 06/22/2013   Procedure: RIGHT HAND SMALL FINGER CLOSED MANIPULATION AND PINNING;  Surgeon: Prentice LELON Pagan, MD;  Location: [facility] OR;  Service: Orthopedics;  Laterality: Right;   I & D EXTREMITY  03/24/2012   Procedure: IRRIGATION AND DEBRIDEMENT EXTREMITY;  Surgeon: Oneil JAYSON Herald, MD;  Location: [facility] OR;  Service: Orthopedics;  Laterality: Right;  Right Forearm   POSTERIOR CERVICAL FUSION/FORAMINOTOMY N/A 05/05/2024   Procedure: POSTERIOR CERVICAL FUSION CERVICAL SEVEN-THORACIC ONE, THORACIC ONE-THORACIC TWO, THORACIC TWO-THORACIC THREE ,FOR RESECTION OF TUMOR THORACIC TWO CORPECTOMY;  Surgeon: Darnella Dorn SAUNDERS, MD;  Location: [facility] OR;  Service: Neurosurgery;  Laterality: N/A;   Family History  Problem Relation Age of Onset   Other Other    Alcohol abuse Other    Social History:  reports that he has been smoking cigarettes. He has a 10 pack-year smoking history. He has never used smokeless tobacco. He reports that he does not currently use alcohol. He reports current drug use. Drug: Marijuana. Allergies:  No Known Allergies No medications prior to admission.     Blood pressure 124/67, pulse 96, temperature 98.8 F (37.1 C), temperature source Oral, resp. rate 13, height 5' 9 (1.753 m), weight (!) 143.5 kg, SpO2 92%. Physical Exam Gen: no distress, normal appearing HEENT: oral mucosa pink and moist, NCAT Cardio: Reg rate Chest: normal effort, normal rate of breathing Abd: +central obesity Ext: no edema Psych: pleasant, normal affect Skin: +incisional vac Neuro: Alert and oriented x3. 5/5 strength in upper extremities, 4/5 strength in bilateral lower extremities, decreased sensation to bilateral feet  Results for orders placed or performed during the hospital encounter of 04/27/24 (from the past 24 hours)  CBC     Status: Abnormal   Collection Time: 05/11/24  4:11 AM  Result Value Ref Range   WBC 19.7 (H) 4.0 - 10.5 K/uL   RBC 3.36 (L) 4.22 - 5.81 MIL/uL   Hemoglobin 10.1 (L) 13.0 - 17.0 g/dL   HCT 69.0 (L) 60.9 - 47.9 %   MCV 92.0 80.0 - 100.0 fL   MCH 30.1 26.0 - 34.0 pg   MCHC 32.7 30.0 - 36.0 g/dL   RDW 85.9 88.4 - 84.4 %   Platelets PLATELET CLUMPS NOTED ON SMEAR, UNABLE TO ESTIMATE 150 - 400 K/uL   nRBC 0.0 0.0 - 0.2 %  APTT     Status: Abnormal   Collection Time: 05/11/24  4:11 AM  Result Value Ref Range   aPTT 38 (H) 24 - 36 seconds   No results found.  Assessment/Plan: Diagnosis: s/p thoracic spinal surgery Does the need for close, 24 hr/day medical supervision in concert with the patient's rehab needs make it unreasonable for this patient to be served in a less intensive setting? Yes Co-Morbidities requiring supervision/potential complications:  1) class 3 obesity: provide dietary education  2) constipation: consider aggressive bowel regimen 3) chronic right shoulder pain: continue PT 4) Acute blood loss anemia: continue to monitor Hgb 5) PE: plan to restart anticoagulation tomorrow  Due to bladder management, bowel management, safety, skin/wound care, disease  management, medication administration, pain management, and patient education, does the patient require 24 hr/day rehab nursing? Yes Does the patient require coordinated care of a physician, rehab nurse, therapy disciplines of PT, OT to address physical and functional deficits in the context of the above medical diagnosis(es)? Yes Addressing deficits in the following areas: balance, endurance, locomotion, strength, transferring, bowel/bladder control, bathing, dressing, feeding, grooming, toileting, and psychosocial support Can the patient actively participate in an intensive therapy program of at least 3 hrs of therapy per day at least 5 days per week? Yes The potential for patient to make measurable gains while on inpatient rehab is excellent Anticipated functional outcomes upon discharge from inpatient rehab are min assist  with PT, min assist with OT, independent with SLP. Estimated rehab length of stay to reach the above functional goals is: 12-16 days Anticipated discharge destination: Home Overall Rehab/Functional Prognosis: excellent  POST ACUTE RECOMMENDATIONS: This patient's condition is appropriate for continued rehabilitative care in the following setting: CIR Patient has agreed to participate in recommended program. Yes Note that insurance prior authorization may be required for reimbursement for recommended care.   I have personally performed a face to face diagnostic evaluation of this patient. Additionally, I have examined the patient's medical record including any pertinent labs and radiographic images.    Thanks,  Sven SHAUNNA Elks, MD 05/11/2024

## 2024-05-11 NOTE — Progress Notes (Signed)
 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 Underwent posterior T1-3 instrumentation fusion T2 corpectomy for resection of tumor (11/13). Pathology shows thyroid papillary carcinoma  LOS: 9 days    Plan: Decadron wean in place Activity as tolerated. No brace needed. PTOT Diet as tolerated Ok for DVT ppx now [facility] for therapeutic anticoagulation today. Given hemorrhagic nature of tumor intraoperatively, recommend starting heparin gtt without bolus while still inpatient to ensure he does not develop a hematoma / neurologic deficit. Once in therapeutic range, if patient is neurologically stable, ok to transition to Eliquis and discharge Will plan for removal of sutures 3 weeks postop. I would like to have his wound checked prior to administering radiation or systemic therapy Wound VAC removed. Wound team will place the prevena. He will go home with it and take it off after 7 days when the battery runs out   Subjective: ***  Objective: Vital signs in last 24 hours: Temp:  [98.1 F (36.7 C)-99.7 F (37.6 C)] 99.7 F (37.6 C) (11/19 1635) Pulse Rate:  [95-107] 98 (11/19 1635) Resp:  [11-16] 11 (11/19 1635) BP: (115-150)/(56-73) 150/73 (11/19 1635) SpO2:  [91 %-97 %] 97 % (11/19 1635)  Intake/Output from previous day: 11/18 0701 - 11/19 0700 In: 560 [P.O.:560] Out: 1275 [Urine:1275] Intake/Output this shift: No intake/output data recorded.  Exam:*** RUE: 5/5 grip LUE: 5/5 grip BLE: 4/5 throughout Improved sensation BLE compared to preop Incisional vac in place  Lab Results: Recent Labs    05/11/24 0411 05/11/24 1324  WBC 19.7* 19.3*  HGB 10.1* 9.8*  HCT 30.9* 29.8*  PLT PLATELET CLUMPS NOTED ON SMEAR, UNABLE TO ESTIMATE PLATELET CLUMPS NOTED ON SMEAR, UNABLE TO ESTIMATE   BMET Recent Labs     05/11/24 1324  NA 133*  K 4.0  CL 97*  CO2 23  GLUCOSE 158*  BUN 15  CREATININE 0.74  CALCIUM 8.1DEWAINE [name] [name] [name] [name] 05/11/2024, 7:13 PM] ***timestamp

## 2024-05-11 NOTE — Consult Note (Signed)
 Palliative Care Consult Note                                  Date: 05/11/2024   Patient Name: Joseph [name]  DOB: [birth year]  MRN: [id number]  Age / Sex: 43 y.o., male  PCP: Patient, No Pcp Per Referring Physician: Verdene Purchase, MD  Reason for Consultation: Pain control  Past Medical History:  Diagnosis Date   Anxiety    Asthma    as a child   Depression    Narcotic abuse (HCC)    Pulmonary nodules 04/29/2024    Subjective:   This NP Camellia Kays reviewed medical records, received report from team, assessed the patient and then meet at the patient's bedside to discuss diagnosis, prognosis, GOC, EOL wishes disposition and options.  Before meeting with the patient/family, I spent time reviewing the chart notes including nursing note from yesterday, neurosurgery note from yesterday, internal medicine note yesterday, SLP note from yesterday, oncology note from yesterday, nurse note from today, neurosurgery note from today, internal medicine note from today, physical med and rehab note from today. I also reviewed vital signs, nursing flowsheets, medication administrations record, labs, and imaging. Labs reviewed include CBC which shows persistent elevated white count at 19.7 likely in the setting of steroids.  Hemoglobin is stable at 10.1 after surgery with hemorrhagic tumor and intraoperative blood loss.  I met with the patient at bedside, no family was present.   We meet to discuss diagnosis prognosis, GOC, EOL wishes, disposition and options. Concept of Palliative Care was introduced as specialized medical care for people and their families living with serious illness.  If focuses on providing relief from the symptoms and stress of a serious illness.  The goal is to improve quality of life for both the patient and the family. Values and goals of care important to patient and family were attempted to be elicited.  Created space and  opportunity for patient  and family to explore thoughts and feelings regarding current medical situation   Natural trajectory and current clinical status were discussed. Questions and concerns addressed. Patient  encouraged to call with questions or concerns.    Patient/Family Understanding of Illness: He tells me he had surgery for a bloody tumor on the spinal column, has a blood clot in his lungs, noted on his spine.  His tumor was removed and they identified that his thyroid is the primary source.  They told him he needs to get better and then he can evaluate for cancer treatment.  We spent time talk about clinical details related to his diagnosis and workup.  Life Review: The patient is not married, has no children.  He has a girlfriend who is 5 months pregnant whom he typically lives with.  However, he understands he may have to live with family after discharge home depending on how functional he is.  For support he relies on his girlfriend as well as friends Tana Ingle, and Koren.  He also relies on family including his parents, younger brother and younger sister all of whom live locally.  Baseline Status: Prior to admission the patient was essentially functionally independent.  When he came in he was unable to walk well.  Today's Discussion: In addition to discussions described above we had extensive discussion on various topics.  We spent time reviewing the clinical situation, and briefly discussed what his goals are.  His goals remain  to be full code and full scope of care, he is interested in ongoing workup and cancer treatment.  We spent time talking about his pain.  His pain is primarily in 2 areas.  First he has shoulder pain which he describes as on and off, worse in the winter.  He states been going on for a long time and feels like an ice pick, stabbing/sharp.  The oxycodone  is ineffective and the Dilaudid  does not seem to work well either.  We discussed that this is unlikely  related to his cancer pain and he agrees.  We talked about how orthopedics has been consulted and they feel it may be a rotator cuff issue and they are planning to evaluate further as an outpatient.  His other area of pain is in the top of his shoulder blades and a little bit further down his back.  This pain is new and described as tight, constricting, with a dull tingle.  It travels down the path of where he thinks they put the rods and screws in it.  His oral oxycodone  IR sometimes helps, Dilaudid  IV helps more.  We also talked about other associated possible symptoms.  His last bowel movement was yesterday, did pass okay.  This is a good resolution because he came in with significant constipation and difficulty having bowel movement, likely due to pain medication.  He denies nausea and vomiting at this time.  He does have some sleep disturbance secondary to his pain.  Related to pain medication we did discuss substance use history.  He has used multiple drugs in the past.  He has used marijuana.  He has also used cocaine in the past, but has not used this in years.  He also uses heroin and his last use was just prior to coming into the hospital.  We spent time talking about substance use and how this can impact pain management including increased tolerance of medications.  This could possibly explain why his medications in the hospital have not been as effective as we had been helping.  He verbalized understanding.  I shared that we would work on adjusting his pain medications with a goal of managing his pain and keeping it tolerable, especially with a goal of rehab for strengthening and ongoing cancer treatment.  However I did explain that it is often impossible to completely remove pain, and he understands this.  Finally we talked about referral to outpatient palliative care and the [facility] with my colleague Fannie Levon Borer, NP for ongoing care after discharge and  he is in agreement.  Finally I asked him who he would want to be his healthcare decision-maker if he was unable to make his own decisions and he shared he would want his mom and dad to be his decision makers.  Not urgent to complete HCPOA documentation as legally they would be his next of kin.  I provided emotional and general support through therapeutic listening, empathy, sharing of stories, and other techniques. I answered all questions and addressed all concerns to the best of my ability.  After seeing the patient I discussed with my colleague Dr. Amaryllis Meissner to discuss options for moving forward with pain management plan.  I also debriefed with the hospitalist, oncologist, and nursing staff.  Goals: Manage pain adequately to allow rehab and cancer treatment.  Full code and full scope of care.  Open to referral to outpatient palliative care in the cancer center.  Review of Systems  Gastrointestinal:  Positive for constipation (Improved). Negative for abdominal pain, nausea and vomiting.  Musculoskeletal:  Positive for back pain (Location and quality as per HPI).    Objective:   Primary Diagnoses: Present on Admission:  Acute pulmonary embolism (HCC)  Thoracic spine fracture (HCC)   Vital Signs:  BP (!) 141/71 (BP Location: Right Wrist)   Pulse 99   Temp 99.2 F (37.3 C) (Oral)   Resp 16   Ht 5' 9 (1.753 m)   Wt (!) 143.5 kg   SpO2 97%   BMI 46.72 kg/m   Physical Exam Vitals and nursing note reviewed.  Constitutional:      General: He is not in acute distress.    Appearance: He is obese. He is ill-appearing. He is not toxic-appearing.     Comments: Appears uncomfortable  HENT:     Head: Normocephalic and atraumatic.  Cardiovascular:     Rate and Rhythm: Normal rate.  Pulmonary:     Effort: Pulmonary effort is normal. No respiratory distress.  Abdominal:     General: There is no distension.     Palpations: Abdomen is soft.     Tenderness: There is no abdominal  tenderness.  Skin:    General: Skin is warm and dry.  Neurological:     General: No focal deficit present.     Mental Status: He is alert.  Psychiatric:        Mood and Affect: Mood normal.        Behavior: Behavior normal.     Palliative Assessment/Data: 50-60%   Assessment & Plan:   HPI/Patient Profile: 44 y.o. male  with past medical history of no significant medical history presented to the hospital with about 4 days of back pain and difficulty walking, he was having some back pain issues for about 2 weeks.  Workup revealed pathological T2 compression fracture and large left thyroid nodule for which she was transferred to [facility] from [facility] for neurosurgery.  He was admitted on 04/27/2024 with metastatic papillary thyroid cancer, T2 pathological fracture with compression myelopathy, C6 lucent lesion suspected metastatic disease, left thyroid enlargement, pulmonary nodules suspected metastatic disease, right upper lobe segmental PE, pulmonary artery hypertension, acute blood loss anemia, thrombocytopenia, chronic right shoulder pain, constipation, and others.   Palliative medicine was consulted for assistance with cancer associated pain management.  SUMMARY OF RECOMMENDATIONS   Full code Full scope of care Added OxyContin  30 mg p.o. twice daily Increased oxycodone  IR to 10-15 mg p.o. every 4 hours prn (was every 6 hours) Continue Dilaudid  1 mg IV every 3 hours as needed See full symptom management below Referral for outpatient palliative care in the cancer center Palliative medicine will follow-up tomorrow for ongoing adjustment  Symptom Management:  Tylenol  1000 mg p.o. every 8 hours Flexeril 10 mg p.o. 3 times daily Robaxin 500 mg p.o. 3 times daily OxyContin  30 mg p.o. twice daily Oxycodone  IR 10 to 15 mg p.o. every 4 hours as needed moderate pain Dilaudid  1 mg IV every 3 hours as needed severe pain Dulcolax EC 5 mg p.o. daily as needed moderate  constipation MiraLAX 17 g p.o. twice daily Senokot S 2 tablets p.o. twice daily  Code Status: Full Code  Prognosis:  Unable to determine  Discharge Planning:  CIR eval panding   Discussed with: Patient, medical team, nursing team    Thank you for allowing us  to participate in the care of GUALBERTO WAHLEN PMT will continue to support  holistically.  Billing based on MDM: High  Problems Addressed: One or more chronic illnesses with severe exacerbation, progression, or side effects of treatment.  Risks: Parenteral controlled substances  Detailed review of medical records (labs, imaging, vital signs), medically appropriate exam, discussed with treatment team, counseling and education to patient, family, & staff, documenting clinical information, medication management, coordination of care  Signed by: Camellia Kays, NP Palliative Medicine Team  Team Phone # [phone] (Nights/Weekends)  05/11/2024, 3:02 PM

## 2024-05-11 NOTE — Progress Notes (Signed)
 Physical Therapy Treatment Patient Details Name: Joseph [name] MRN: [id number] DOB: 24-Sep-1980 Today's Date: 05/11/2024   History of Present Illness Joseph [name] is a 43 yo male who presented with 4 days of back pain and difficulty walking.  CTA chest PE protocol showed right upper lobe segmental PE. MRI of the thoracic spine ultimately showed pathological T2 compression fracture. Thyroid  ultrasound showed large left thyroid  nodule. 11/12, IR performed US  guided left lobe thyroid  nodule biopsy.  11/13: S/p posterior T1-3 instrumentation fusion T2 corpectomy for resection of tumor.  Thyroid  biopsy showed papillary carcinoma. PMHx: none.    PT Comments  Pt endorsing severe interscapular and RUE pain this date, has already had all the pain medications he is due for at time of PT session. Pt motivated to attempt OOB mobility despite pain. Pt requiring significant assist for transition to EOB and into standing, pt unable to progress beyond just standing given severe pain (trembling, tearful, significant diaphoresis). VSS throughout session. PT to continue to follow.      If plan is discharge home, recommend the following: A lot of help with walking and/or transfers;A lot of help with bathing/dressing/bathroom   Can travel by private vehicle        Equipment Recommendations  Other (comment);Rolling walker (2 wheels) (tbd)    Recommendations for Other Services       Precautions / Restrictions Precautions Precautions: Fall;Back Precaution Booklet Issued: No Recall of Precautions/Restrictions: Intact Precaution/Restrictions Comments: painful R shoulder, present prior to admission but pt states worse now Restrictions Weight Bearing Restrictions Per Provider Order: No Other Position/Activity Restrictions: Dr. Darnella via secure chat 11/17 - WBAT all extremities     Mobility  Bed Mobility Overal bed mobility: Needs Assistance Bed Mobility: Rolling, Sidelying to Sit, Sit to  Sidelying Rolling: Mod assist Sidelying to sit: Max assist     Sit to sidelying: Max assist, +2 for physical assistance General bed mobility comments: log roll towards R with use of LUE to support, assist for LE progression to EOB, trunk elevation, scooting to EOB with assist of bed pad, trunk lowering, LE lift into bed, and boost up.    Transfers Overall transfer level: Needs assistance Equipment used: Right platform walker Transfers: Sit to/from Stand Sit to Stand: +2 physical assistance, From elevated surface, Mod assist           General transfer comment: assist for power up, rise, steadying. Cues for hand placement and PT supporting RUE given increased pain with RUE in dependent position. Unable to place pt's RUE on platform on R today given severe pain    Ambulation/Gait               General Gait Details: unable this date, given severe pain   Stairs             Wheelchair Mobility     Tilt Bed    Modified Rankin (Stroke Patients Only)       Balance Overall balance assessment: History of Falls, Needs assistance Sitting-balance support: Feet supported Sitting balance-Leahy Scale: Fair     Standing balance support: Bilateral upper extremity supported, During functional activity, Reliant on assistive device for balance Standing balance-Leahy Scale: Poor Standing balance comment: reliant on external support                            Communication Communication Communication: No apparent difficulties  Cognition Arousal: Alert Behavior During Therapy: WFL for tasks assessed/performed  PT - Cognitive impairments: No apparent impairments                         Following commands: Intact      Cueing Cueing Techniques: Verbal cues  Exercises      General Comments        Pertinent Vitals/Pain Pain Assessment Pain Assessment: Faces Faces Pain Scale: Hurts whole lot Pain Location: R shoulder>back Pain Descriptors  / Indicators: Constant, Discomfort, Grimacing, Guarding Pain Intervention(s): Limited activity within patient's tolerance, Monitored during session, Repositioned    Home Living                          Prior Function            PT Goals (current goals can now be found in the care plan section) Acute Rehab PT Goals Patient Stated Goal: to get stronger and go home PT Goal Formulation: With patient Time For Goal Achievement: 05/23/24 Potential to Achieve Goals: Good Progress towards PT goals: Progressing toward goals    Frequency    Min 3X/week      PT Plan      Co-evaluation              AM-PAC PT 6 Clicks Mobility   Outcome Measure  Help needed turning from your back to your side while in a flat bed without using bedrails?: A Lot Help needed moving from lying on your back to sitting on the side of a flat bed without using bedrails?: A Lot Help needed moving to and from a bed to a chair (including a wheelchair)?: A Lot Help needed standing up from a chair using your arms (e.g., wheelchair or bedside chair)?: A Lot Help needed to walk in hospital room?: Total Help needed climbing 3-5 steps with a railing? : Total 6 Click Score: 10    End of Session Equipment Utilized During Treatment: Gait belt Activity Tolerance: Patient limited by fatigue Patient left: with call bell/phone within reach;in bed Nurse Communication: Mobility status PT Visit Diagnosis: Unsteadiness on feet (R26.81);Other abnormalities of gait and mobility (R26.89);Difficulty in walking, not elsewhere classified (R26.2);Pain Pain - Right/Left: Right Pain - part of body: Shoulder;Arm     Time: 8890-8867 PT Time Calculation (min) (ACUTE ONLY): 23 min  Charges:    $Therapeutic Activity: 8-22 mins PT General Charges $$ ACUTE PT VISIT: 1 Visit                     Joseph [name], PT DPT Acute Rehabilitation Services Secure Chat Preferred  Office [phone]    Joseph [name] 05/11/2024, 3:39 PM

## 2024-05-11 NOTE — Progress Notes (Signed)
 Assessment 43 y/o M w/ hx morbid obesity, substance abuse who presents with pathologic T2 osteolytic fracture, high grade ESCC, progressive thoracic myelopathy, unknown metastatic primary seemingly thyroid  vs pulm primary. Underwent posterior T1-3 instrumentation fusion T2 corpectomy for resection of tumor (11/13). Pathology shows thyroid  papillary carcinoma  LOS: 9 days    Plan: Decadron  wean in place Activity as tolerated. No brace needed. PTOT Diet as tolerated Ok for DVT ppx now [facility] for therapeutic anticoagulation on 11/20. Given hemorrhagic nature of tumor intraoperatively, recommend starting heparin  gtt without bolus while still inpatient to ensure he does not develop a hematoma / neurologic deficit. Once in therapeutic range, if patient is neurologically stable, ok to transition to Eliquis  and discharge Will plan for removal of sutures 3 weeks postop. I would like to have his wound checked prior to administering radiation or systemic therapy Will place prevena tomorrow in place of wound VAC. He will wear this for 7 days. He can discharge with the prevena   Subjective: Neurologically stable. Was up with therapy  Objective: Vital signs in last 24 hours: Temp:  [98.1 F (36.7 C)-99.7 F (37.6 C)] 98.8 F (37.1 C) (11/19 0732) Pulse Rate:  [90-99] 96 (11/19 0732) Resp:  [11-15] 13 (11/19 0732) BP: (115-146)/(56-76) 124/67 (11/19 0732) SpO2:  [91 %-100 %] 92 % (11/19 0732)  Intake/Output from previous day: 11/18 0701 - 11/19 0700 In: 560 [P.O.:560] Out: 1275 [Urine:1275] Intake/Output this shift: No intake/output data recorded.  Exam: RUE: 5/5 grip LUE: 5/5 grip BLE: 4/5 throughout Improved sensation BLE compared to preop Incisional vac in place  Lab Results: Recent Labs    05/10/24 0350 05/11/24 0411  WBC 20.3* 19.7*  HGB 10.4* 10.1*  HCT 31.8* 30.9*  PLT PLATELET CLUMPS NOTED ON SMEAR, UNABLE TO ESTIMATE PLATELET CLUMPS NOTED ON SMEAR, UNABLE TO ESTIMATE    BMET No results for input(s): NA, K, CL, CO2, GLUCOSE, BUN, CREATININE, CALCIUM in the last 72 hours.     Joseph [name] 05/11/2024, 7:36 AM]

## 2024-05-12 ENCOUNTER — Inpatient Hospital Stay (HOSPITAL_COMMUNITY): Payer: MEDICAID

## 2024-05-12 DIAGNOSIS — I2693 Single subsegmental pulmonary embolism without acute cor pulmonale: Secondary | ICD-10-CM

## 2024-05-12 DIAGNOSIS — I2699 Other pulmonary embolism without acute cor pulmonale: Secondary | ICD-10-CM | POA: Diagnosis not present

## 2024-05-12 DIAGNOSIS — C73 Malignant neoplasm of thyroid gland: Secondary | ICD-10-CM | POA: Diagnosis not present

## 2024-05-12 LAB — BASIC METABOLIC PANEL WITH GFR
Anion gap: 10 (ref 5–15)
BUN: 14 mg/dL (ref 6–20)
CO2: 24 mmol/L (ref 22–32)
Calcium: 8.3 mg/dL — ABNORMAL LOW (ref 8.9–10.3)
Chloride: 100 mmol/L (ref 98–111)
Creatinine, Ser: 0.84 mg/dL (ref 0.61–1.24)
GFR, Estimated: >60 mL/min (ref >60)
Glucose, Bld: 132 mg/dL — ABNORMAL HIGH (ref 70–99)
Potassium: 3.8 mmol/L (ref 3.5–5.1)
Sodium: 134 mmol/L — ABNORMAL LOW (ref 135–145)

## 2024-05-12 LAB — CBC
HCT: 31.4 % — ABNORMAL LOW (ref 39.0–52.0)
Hemoglobin: 10 g/dL — ABNORMAL LOW (ref 13.0–17.0)
MCH: 29.8 pg (ref 26.0–34.0)
MCHC: 31.8 g/dL (ref 30.0–36.0)
MCV: 93.5 fL (ref 80.0–100.0)
Platelets: UNDETERMINED K/uL (ref 150–400)
RBC: 3.36 MIL/uL — ABNORMAL LOW (ref 4.22–5.81)
RDW: 14.2 % (ref 11.5–15.5)
WBC: 19.4 K/uL — ABNORMAL HIGH (ref 4.0–10.5)
nRBC: 0 % (ref 0.0–0.2)

## 2024-05-12 LAB — URINALYSIS, ROUTINE W REFLEX MICROSCOPIC
Bilirubin Urine: NEGATIVE
Glucose, UA: NEGATIVE mg/dL
Hgb urine dipstick: NEGATIVE
Ketones, ur: NEGATIVE mg/dL
Leukocytes,Ua: NEGATIVE
Nitrite: NEGATIVE
Protein, ur: NEGATIVE mg/dL
Specific Gravity, Urine: 1.029 (ref 1.005–1.030)
pH: 5 (ref 5.0–8.0)

## 2024-05-12 LAB — APTT: aPTT: 35 s (ref 24–36)

## 2024-05-12 LAB — HEPARIN LEVEL (UNFRACTIONATED)
Heparin Unfractionated: >1.1 [IU]/mL — ABNORMAL HIGH (ref 0.30–0.70)
Heparin Unfractionated: 0.28 [IU]/mL — ABNORMAL LOW (ref 0.30–0.70)

## 2024-05-12 MED ORDER — OXYCODONE HCL 5 MG PO TABS
20.0000 mg | ORAL_TABLET | ORAL | Status: DC | PRN
  Administered 2024-05-12 – 2024-05-16 (×12): 20 mg via ORAL
  Filled 2024-05-12 (×12): qty 4

## 2024-05-12 MED ORDER — OXYCODONE HCL ER 15 MG PO T12A
60.0000 mg | EXTENDED_RELEASE_TABLET | Freq: Three times a day (TID) | ORAL | Status: DC
  Administered 2024-05-12 – 2024-05-13 (×3): 60 mg via ORAL
  Filled 2024-05-12 (×3): qty 4

## 2024-05-12 MED ORDER — HEPARIN (PORCINE) 25000 UT/250ML-% IV SOLN
1650.0000 [IU]/h | INTRAVENOUS | Status: DC
  Administered 2024-05-12: 1400 [IU]/h via INTRAVENOUS
  Administered 2024-05-13: 1650 [IU]/h via INTRAVENOUS
  Administered 2024-05-13: 1500 [IU]/h via INTRAVENOUS
  Filled 2024-05-12 (×3): qty 250

## 2024-05-12 NOTE — Progress Notes (Signed)
 Physical Therapy Treatment Patient Details Name: Joseph [name] MRN: [id number] DOB: [birth year] Today's Date: 05/12/2024   History of Present Illness Joseph [name] is a 43 yo male who presented with 4 days of back pain and difficulty walking.  CTA chest PE protocol showed right upper lobe segmental PE. MRI of the thoracic spine ultimately showed pathological T2 compression fracture. Thyroid ultrasound showed large left thyroid nodule. 11/12, IR performed US  guided left lobe thyroid nodule biopsy.  11/13: S/p posterior T1-3 instrumentation fusion T2 corpectomy for resection of tumor.  Thyroid biopsy showed papillary carcinoma. PMHx: none.    PT Comments  Pt up in chair upon arrival to room, endorsing severe RUE and mid-thoracic pain even though was premedicated. PT attempting to progress pt away from use of stedy with therapy, however pt not tolerating RUE platform RW or regular RW given severe pain in RUE to the point of tears. Stedy required for safe transfer back to bed, mod +2-3 assist. Pt would benefit from use of sling to support RUE, tends to be most painful with WB and when allowed to hang dependently.     If plan is discharge home, recommend the following: Two people to help with walking and/or transfers;Two people to help with bathing/dressing/bathroom   Can travel by private vehicle        Equipment Recommendations  Other (comment);Rolling walker (2 wheels) (tbd)    Recommendations for Other Services       Precautions / Restrictions Precautions Precautions: Fall;Back Precaution Booklet Issued: No Recall of Precautions/Restrictions: Intact Precaution/Restrictions Comments: painful R shoulder, present prior to admission but pt states worse now Restrictions Weight Bearing Restrictions Per Provider Order: No Other Position/Activity Restrictions: Dr. Darnella via secure chat 11/17 - WBAT all extremities     Mobility  Bed Mobility Overal bed mobility: Needs Assistance Bed  Mobility: Rolling, Sit to Sidelying Rolling: Mod assist       Sit to sidelying: Mod assist, +2 for physical assistance General bed mobility comments: assist for trunk and LE management, boost up in bed upon return to supine.    Transfers Overall transfer level: Needs assistance Equipment used: Right platform walker, Ambulation equipment used Transfers: Sit to/from Stand Sit to Stand: +2 physical assistance, From elevated surface, Mod assist           General transfer comment: assist for power up, rise, steady, x2 techs assisting laterally with bed pad and PT assisting with anterior weight shift anteriorly. Pt very limited by RUE pain on R platform RW, transitioned to use of stedy with pt's RUE placed in sling-type position with use of gait belt which improved pt pain.    Ambulation/Gait               General Gait Details: unable this date, given severe pain   Stairs             Wheelchair Mobility     Tilt Bed    Modified Rankin (Stroke Patients Only)       Balance Overall balance assessment: History of Falls, Needs assistance Sitting-balance support: Feet supported Sitting balance-Leahy Scale: Fair     Standing balance support: Bilateral upper extremity supported, During functional activity, Reliant on assistive device for balance Standing balance-Leahy Scale: Poor Standing balance comment: reliant on external support                            Communication Communication Communication: No apparent difficulties  Cognition  Arousal: Alert Behavior During Therapy: WFL for tasks assessed/performed   PT - Cognitive impairments: No apparent impairments                         Following commands: Intact      Cueing Cueing Techniques: Verbal cues  Exercises      General Comments        Pertinent Vitals/Pain Pain Assessment Pain Assessment: Faces Faces Pain Scale: Hurts even more Pain Location: R shoulder>back Pain  Descriptors / Indicators: Constant, Discomfort, Grimacing, Guarding Pain Intervention(s): Limited activity within patient's tolerance, Monitored during session, Repositioned    Home Living                          Prior Function            PT Goals (current goals can now be found in the care plan section) Acute Rehab PT Goals Patient Stated Goal: to get stronger and go home PT Goal Formulation: With patient Time For Goal Achievement: 05/23/24 Potential to Achieve Goals: Good Progress towards PT goals: Progressing toward goals    Frequency    Min 3X/week      PT Plan      Co-evaluation              AM-PAC PT 6 Clicks Mobility   Outcome Measure  Help needed turning from your back to your side while in a flat bed without using bedrails?: A Lot Help needed moving from lying on your back to sitting on the side of a flat bed without using bedrails?: A Lot Help needed moving to and from a bed to a chair (including a wheelchair)?: Total Help needed standing up from a chair using your arms (e.g., wheelchair or bedside chair)?: Total Help needed to walk in hospital room?: Total Help needed climbing 3-5 steps with a railing? : Total 6 Click Score: 8    End of Session Equipment Utilized During Treatment: Gait belt Activity Tolerance: Patient limited by fatigue Patient left: with call bell/phone within reach;in bed;with bed alarm set Nurse Communication: Mobility status PT Visit Diagnosis: Unsteadiness on feet (R26.81);Other abnormalities of gait and mobility (R26.89);Difficulty in walking, not elsewhere classified (R26.2);Pain Pain - Right/Left: Right Pain - part of body: Shoulder;Arm     Time: 1107-1130 PT Time Calculation (min) (ACUTE ONLY): 23 min  Charges:    $Therapeutic Activity: 23-37 mins PT General Charges $$ ACUTE PT VISIT: 1 Visit                     Joseph [name], PT DPT Acute Rehabilitation Services Secure Chat Preferred  Office  [phone]    Joseph [name] 05/12/2024, 2:39 PM

## 2024-05-12 NOTE — Progress Notes (Signed)
 PHARMACY - ANTICOAGULATION CONSULT NOTE  Pharmacy Consult for heparin   Indication: pulmonary embolus  No Known Allergies  Patient Measurements: Height: 5' 9 (175.3 cm) Weight: (!) 143.5 kg (316 lb 5.8 oz) IBW/kg (Calculated) : 70.7 HEPARIN  DW (KG): 104.9  Vital Signs: Temp: 98.4 F (36.9 C) (11/20 1558) Temp Source: Oral (11/20 1558) BP: 152/64 (11/20 1558) Pulse Rate: 96 (11/20 1558)  Labs: Recent Labs    05/11/24 0411 05/11/24 1324 05/12/24 0609 05/12/24 1840 05/12/24 1930  HGB 10.1* 9.8* 10.0*  --   --   HCT 30.9* 29.8* 31.4*  --   --   PLT PLATELET CLUMPS NOTED ON SMEAR, UNABLE TO ESTIMATE PLATELET CLUMPS NOTED ON SMEAR, UNABLE TO ESTIMATE PLATELET CLUMPS NOTED ON SMEAR, UNABLE TO ESTIMATE  --   --   APTT 38* 34 35  --   --   LABPROT  --  15.8*  --   --   --   INR  --  1.2  --   --   --   HEPARINUNFRC  --   --   --  >1.10* 0.28*  CREATININE  --  0.74 0.84  --   --     Estimated Creatinine Clearance: 160.1 mL/min (by C-G formula based on SCr of 0.84 mg/dL).   Medical History: Past Medical History:  Diagnosis Date   Anxiety    Asthma    as a child   Depression    Narcotic abuse (HCC)    Pulmonary nodules 04/29/2024    Assessment: Patient admitted with CC of back pain. Found to have acute PE on 11/6. Started on Eliquis  which was stopped on 11/9. Found to have T2 fracture with compressive myelopathy and C6 lesion. Patient underwent a T1-T3 instrumentation fusion T2 for resection of tumor on 11/13 with pathology showing thyroid  papillary carcinoma. Tumor was noted to be hemorrhagic intraoperatively, NS recommending heparin  w/o bolus.   No CBC from today, HgB from 11/19 stable at 9.8 and PLTs unable to be calculated due to clumping. PLTs at admission 112. Confirmed with MD okay to start heparin  gtt without recent PLT count.   Heparin  level 0.28 this PM  Goal of Therapy:  Heparin  level 0.3-0.5  Monitor platelets by anticoagulation protocol: Yes   Plan:   Increase heparin  to 1500 units / hr  Follow up AM labs Monitor closely for s/sx of bleeding, Eliquis  and Xarelto co-pay $4.00.   Lestat Golob Blush, PharmD, BCCCP  05/12/2024,8:48 PM

## 2024-05-12 NOTE — Progress Notes (Signed)
 Daily Progress Note   Date: 05/12/2024   Patient Name: Joseph [name]  DOB: 09-13-1980  MRN: [id number]  Age / Sex: 43 y.o., male  Attending Physician: Verdene Purchase, MD Primary Care Physician: Patient, No Pcp Per Admit Date: 04/27/2024 Length of Stay: 10 days  Reason for Follow-up: Establishing goals of care  Past Medical History:  Diagnosis Date   Anxiety    Asthma    as a child   Depression    Narcotic abuse (HCC)    Pulmonary nodules 04/29/2024    Subjective:   Subjective: Chart Reviewed. Updates received. Patient Assessed. Created space and opportunity for patient  and family to explore thoughts and feelings regarding current medical situation.  Today's Discussion: Today before meeting with the patient/family, I reviewed the chart notes including PT and OT notes yesterday, neurosurgery note from yesterday, internal medicine note from yesterday, rehab admission coordinator note from yesterday. I also reviewed vital signs, nursing flowsheets, medication administrations record, labs, and imaging. Labs reviewed include BMP with BUN/creatinine normal at 14/0.84 important in the setting of medication titration, white blood cell count remains elevated but stable at 19.4 postop in the setting of metastatic cancer as well as PE with continued low-grade fever likely multifactorial.  Prior to seeing the patient I did extensive review of his medication administration to aid in titration of pain medication for pain management in the setting of cancer related pain.  Today saw the patient at bedside, a good friend of his was present as well.  Today he states his pain is about the same as it was yesterday.  On the chart it appears he has requested increasing amounts of as needed oxycodone  IR and Dilaudid , despite the addition of extended release OxyContin  yesterday.  However, he was working with PT and OT today and this could be affecting his severity of pain.  The patient asked very  appropriate questions about pain management.  He understands that it we are unlikely to eliminate his pain.  We again discussed appropriate goal for reduction in pain to a tolerable level that allows him to maintain function and participate in rehab which he agrees with.  We spent time talking about that it might be a slow process to get his pain under control with the need to cautiously adjust medications to not overdo it and risk of respiratory compromise.  We spent time talking about the pathophysiology of opioids and how they can affect the body multiple ways.  I shared that I would make adjustments to his Chane medications based on this information and review his chart.  I shared that I would follow-up tomorrow as well. I provided emotional and general support through therapeutic listening, empathy, sharing of stories, and other techniques. I answered all questions and addressed all concerns to the best of my ability.  Review of Systems  Respiratory:  Negative for shortness of breath.   Cardiovascular:  Negative for chest pain.  Gastrointestinal:  Negative for abdominal pain, nausea and vomiting.  Musculoskeletal:  Positive for back pain (Upper back near shoulder blades and slightly lower).       Shoulder pain    Objective:   Primary Diagnoses: Present on Admission:  Acute pulmonary embolism (HCC)  Thoracic spine fracture (HCC)   Vital Signs:  BP (!) 167/78 (BP Location: Right Leg)   Pulse 92   Temp 97.8 F (36.6 C) (Oral)   Resp 12   Ht 5' 9 (1.753 m)   Wt (!) 143.5 kg  SpO2 95%   BMI 46.72 kg/m   Physical Exam Vitals and nursing note reviewed.  Constitutional:      General: He is not in acute distress.    Appearance: He is not toxic-appearing.  HENT:     Head: Normocephalic and atraumatic.  Cardiovascular:     Rate and Rhythm: Normal rate.  Pulmonary:     Effort: Pulmonary effort is normal. No respiratory distress.     Breath sounds: No wheezing or rhonchi.   Abdominal:     General: Abdomen is flat. Bowel sounds are normal. There is no distension.     Palpations: Abdomen is soft.     Tenderness: There is no abdominal tenderness.  Skin:    General: Skin is warm and dry.  Neurological:     General: No focal deficit present.     Mental Status: He is alert and oriented to person, place, and time.  Psychiatric:        Mood and Affect: Mood normal.        Behavior: Behavior normal.     Palliative Assessment/Data: 60%   Existing Vynca/ACP Documentation: None  Assessment & Plan:   HPI/Patient Profile:  43 y.o. male  with past medical history of no significant medical history presented to the hospital with about 4 days of back pain and difficulty walking, he was having some back pain issues for about 2 weeks.  Workup revealed pathological T2 compression fracture and large left thyroid  nodule for which she was transferred to [facility] from [facility] for neurosurgery.  He was admitted on 04/27/2024 with metastatic papillary thyroid  cancer, T2 pathological fracture with compression myelopathy, C6 lucent lesion suspected metastatic disease, left thyroid  enlargement, pulmonary nodules suspected metastatic disease, right upper lobe segmental PE, pulmonary artery hypertension, acute blood loss anemia, thrombocytopenia, chronic right shoulder pain, constipation, and others.    Palliative medicine was consulted for assistance with cancer associated pain management.  SUMMARY OF RECOMMENDATIONS   Full code Full scope of care Increased OxyContin  to 60 mg p.o. every 8 hours, starting 2200 tonight Increased oxycodone  IR to 20 mg p.o. every 4 hours prn Continue Dilaudid  1 mg IV every 3 hours as needed Use oxycodone  IR first for moderate to severe pain IV Dilaudid  as a rescue medication for ineffective oxycodone  IR See full symptom management below Referral for outpatient palliative care in the cancer center Palliative medicine will follow-up  tomorrow for ongoing adjustment  Symptom Management:  Tylenol  1000 mg p.o. every 8 hours Flexeril  10 mg p.o. 3 times daily Robaxin  500 mg p.o. 3 times daily INCREASED OxyContin  60 mg p.o. every 8 hours INCREASED Oxycodone  IR 20 mg p.o. every 4 hours as needed moderate pain Dilaudid  1 mg IV every 3 hours as needed severe pain Dulcolax EC 5 mg p.o. daily as needed moderate constipation MiraLAX  17 g p.o. twice daily Senokot S 2 tablets p.o. twice daily  Code Status: Full Code  Prognosis: Unable to determine  Discharge Planning: To Be Determined  Discussed with: Patient, friend, medical team, nursing team  Thank you for allowing us  to participate in the care of GARON MELANDER PMT will continue to support holistically.  Billing based on MDM: High  Problems Addressed: One acute or chronic illness or injury that poses a threat to life or bodily function  Risks: Parenteral controlled substances  Detailed review of medical records (labs, imaging, vital signs), medically appropriate exam, discussed with treatment team, counseling and education to patient, family, &  staff, documenting clinical information, medication management, coordination of care  Camellia Kays, NP Palliative Medicine Team  Team Phone # [phone] (Nights/Weekends)  02/19/2021, 8:17 AM

## 2024-05-12 NOTE — Progress Notes (Addendum)
 PHARMACY - ANTICOAGULATION CONSULT NOTE  Pharmacy Consult for heparin   Indication: pulmonary embolus  No Known Allergies  Patient Measurements: Height: 5' 9 (175.3 cm) Weight: (!) 143.5 kg (316 lb 5.8 oz) IBW/kg (Calculated) : 70.7 HEPARIN  DW (KG): 104.9  Vital Signs: Temp: 98.7 F (37.1 C) (11/20 0746) Temp Source: Oral (11/20 0746) BP: 96/81 (11/20 0746) Pulse Rate: 90 (11/20 0746)  Labs: Recent Labs    05/10/24 0350 05/11/24 0411 05/11/24 1324 05/12/24 0609  HGB 10.4* 10.1* 9.8*  --   HCT 31.8* 30.9* 29.8*  --   PLT PLATELET CLUMPS NOTED ON SMEAR, UNABLE TO ESTIMATE PLATELET CLUMPS NOTED ON SMEAR, UNABLE TO ESTIMATE PLATELET CLUMPS NOTED ON SMEAR, UNABLE TO ESTIMATE  --   APTT 30 38* 34 35  LABPROT  --   --  15.8*  --   INR  --   --  1.2  --   CREATININE  --   --  0.74 0.84    Estimated Creatinine Clearance: 160.1 mL/min (by C-G formula based on SCr of 0.84 mg/dL).   Medical History: Past Medical History:  Diagnosis Date   Anxiety    Asthma    as a child   Depression    Narcotic abuse (HCC)    Pulmonary nodules 04/29/2024    Assessment: Patient admitted with CC of back pain. Found to have acute PE on 11/6. Started on Eliquis  which was stopped on 11/9. Found to have T2 fracture with compressive myelopathy and C6 lesion. Patient underwent a T1-T3 instrumentation fusion T2 for resection of tumor on 11/13 with pathology showing thyroid  papillary carcinoma. Tumor was noted to be hemorrhagic intraoperatively, NS recommending heparin  w/o bolus.   No CBC from today, HgB from 11/19 stable at 9.8 and PLTs unable to be calculated due to clumping. PLTs at admission 112. Confirmed with MD okay to start heparin  gtt without recent PLT count.   Goal of Therapy:  Heparin  level 0.3-0.5  Monitor platelets by anticoagulation protocol: Yes   Plan:  No bolus per NGSY.  Start heparin  at 1400u/hr, lower rate due to higher risk of bleeding. Plan to transition to DOAC once  patient is therapeutic and confirmed no bleeding.  CBC in process for this AM.  6 hour HL. D/c heparin  subcutaneous.  Monitor closely for s/sx of bleeding, Eliquis  and Xarelto co-pay $4.00.   Powell Blush, PharmD, BCCCP  05/12/2024,9:35 AM

## 2024-05-12 NOTE — Progress Notes (Signed)
 Inpatient Rehab Coordinator Note:  I met with patient, his dad, and later his cousin, at bedside to discuss CIR recommendations and goals/expectations of CIR stay.  We reviewed 3 hrs/day of therapy, physician follow up, and average length of stay 2 weeks (dependent upon progress) with goals of supervision to min assist.  Pt plans to discharge to his aunt's home at discharge, one level with 1+1 step entry and is RW accessible (maybe w/c too).  He will have assist from aunt, dad, and other family members at discharge.  We discussed need for insurance approval and MD approval for rehab admission as well.  Note pt continues to have elevated WBC and intermittent fevers (tmax. 100.8) and CXR/UA negative.  Will discuss with MDs in AM.    Reche Lowers, PT, DPT Admissions Coordinator [phone] 05/12/24 3:48 PM

## 2024-05-12 NOTE — Progress Notes (Signed)
 PROGRESS NOTE   Joseph [name]  FMW:[id number]    DOB: [birth year]    DOA: 04/27/2024  PCP: Patient, No Pcp Per    Brief Hospital Course:  43 year old gentleman with no significant medical history presented to the hospital with about 4 days of back pain and difficulty walking, he was having some back pain issues for about 2 weeks.  CT scan of the lumbar spine was unrevealing.  CTA chest PE protocol showed right upper lobe segmental PE. He was started on Eliquis. Patient continued to have difficulty with mobility.  Multiple investigations were done.  MRI of the thoracic spine ultimately showed pathological T2 compression fracture. Thyroid ultrasound showed large left thyroid nodule.  He was transferred to [facility] from [place] for Neurosurgery.  11/12, IR performed US  guided left lobe thyroid nodule biopsy.  11/13: S/p posterior T1-3 instrumentation fusion T2 corpectomy for resection of tumor.  Thyroid biopsy showed papillary carcinoma, oncology consulted and seen on 11/14 and plan to follow-up this week.   Assessment & Plan:   Metastatic papillary thyroid cancer Back pain and difficulty walking T2 pathological fracture with compressive myelopathy C6 lucent lesion, metastatic disease suspected Left thyroid enlargement Pulmonary nodules, suspected metastatic Neurosurgery consulted and postop management per them.  11/13: S/p posterior T1-3 instrumentation fusion T2 corpectomy for resection of tumor.  Extensive procedure.  Hemorrhagic tumor.  1500 mL EBL.  Got 2 units of PRBC across surgery.   JP drain removed.  Wound VAC to stay until time of discharge at which point, surgeons plan to apply a 7-day Prevena at discharge. Heparin DVT prophylaxis started 11/17.   Thyroid biopsy pathology confirms papillary carcinoma.   Patient seen by medical oncology.  They have placed referral to ENT and endocrinology.  Radiation oncology referral was placed by neurosurgery.  All of this will be  accomplished in the outpatient setting. Seen by therapy send they recommend inpatient rehabilitation. Dexamethasone to be weaned off gradually.  Right upper lobe segmental PE Pulmonary artery hypertension Anticoagulation was on hold due to surgery.   Per neurosurgery, okay for therapeutic anticoagulation on 11/20.  They recommend starting with heparin infusion for 48 to 48 hours and then transition to apixaban if no bleeding complications.  Will initiate heparin today.  Fever/leukocytosis Noted to have low-grade fever early this morning but spontaneously resolved.  WBC has been elevated.  Will check UA and chest x-ray.  Will recheck CBC tomorrow.  Low threshold to initiate antibiotics if he has fever.  Acute blood loss anemia As noted above, EBL 1500 mL during neurosurgical procedure 11/13 and got 2 units PRBC.  Hemoglobin stable for the last several days.  Thrombocytopenia Platelet clumping on daily serial CBCs and counts not known.  Leukocytosis No clinical concern for infection at this time.  Suspected due to Decadron.  Remains afebrile.  Right shoulder pain, chronic Pain predates his current surgery.  Denies any worsening since hospital admission.  Communicated with Orthopedics PA 11/17 and recommends outpatient follow-up for suspected rotator cuff pathology.  PT evaluation.  Constipation Continue bowel regimen.  Patient did have a bowel movement on 11/18.    Body mass index is 46.72 kg/m./Very morbid obesity class III Complicates care.  Outpatient follow-up.   DVT prophylaxis: Subcutaneous heparin   Code Status: Full Code:  Family Communication: None at bedside.   Disposition: Inpatient rehabilitation is being pursued.   Consultants:   Neurosurgery Neurology Interventional radiology Medical oncology  Procedures:   As above Difficult LUE PICC  line placement by PICC team on 11/15.  Subjective:  No new complaints offered.  Denies any significant shortness of breath.   Denies any abdominal pain or diarrhea.  No joint pains except for his right shoulder pain which is chronic.  Objective:   Vitals:   05/11/24 2313 05/12/24 0317 05/12/24 0400 05/12/24 0746  BP: 129/78 123/70  96/81  Pulse: 94 97  90  Resp: 11 11  14   Temp: 99.2 F (37.3 C) (!) 100.8 F (38.2 C) 99.4 F (37.4 C) 98.7 F (37.1 C)  TempSrc: Oral Axillary  Oral  SpO2: 95% 92% 95% 93%  Weight:      Height:        General appearance: Awake alert.  In no distress Resp: Clear to auscultation bilaterally.  Normal effort Cardio: S1-S2 is normal regular.  No S3-S4.  No rubs murmurs or bruit GI: Abdomen is soft.  Nontender nondistended.  Bowel sounds are present normal.  No masses organomegaly Extremities: Noted to move his extremities.     Data Reviewed:     CBC: Recent Labs  Lab 05/09/24 0550 05/09/24 0551 05/10/24 0350 05/11/24 0411 05/11/24 1324  WBC 19.9*   < > 20.3* 19.7* 19.3*  NEUTROABS 13.9*  --   --   --   --   HGB 11.1*   < > 10.4* 10.1* 9.8*  HCT 33.3*   < > 31.8* 30.9* 29.8*  MCV 90.5   < > 91.6 92.0 92.5  PLT PLATELET CLUMPS NOTED ON SMEAR, UNABLE TO ESTIMATE   < > PLATELET CLUMPS NOTED ON SMEAR, UNABLE TO ESTIMATE PLATELET CLUMPS NOTED ON SMEAR, UNABLE TO ESTIMATE PLATELET CLUMPS NOTED ON SMEAR, UNABLE TO ESTIMATE   < > = values in this interval not displayed.    Basic Metabolic Panel: Recent Labs  Lab 05/05/24 1409 05/06/24 0529 05/07/24 0349 05/11/24 1324 05/12/24 0609  NA 137 136 135 133* 134*  K 4.4 4.1 4.1 4.0 3.8  CL  --  101 101 97* 100  CO2  --  24 24 23 24   GLUCOSE  --  123* 139* 158* 132*  BUN  --  15 14 15 14   CREATININE  --  0.86 0.78 0.74 0.84  CALCIUM  --  8.8* 8.7* 8.1* 8.3*    Liver Function Tests: Recent Labs  Lab 05/07/24 0349  AST 21  ALT 29  ALKPHOS 46  BILITOT 1.2  PROT 6.8  ALBUMIN 3.1*     Microbiology Studies:   Recent Results (from the past 240 hours)  Surgical pcr screen     Status: Abnormal   Collection  Time: 05/04/24  6:45 PM   Specimen: Nasal Mucosa; Nasal Swab  Result Value Ref Range Status   MRSA, PCR NEGATIVE NEGATIVE Final   Staphylococcus aureus POSITIVE (A) NEGATIVE Final    Comment: (NOTE) The Xpert SA Assay (FDA approved for NASAL specimens in patients 74 years of age and older), is one component of a comprehensive surveillance program. It is not intended to diagnose infection nor to guide or monitor treatment. Performed at [facility], [address], [place], [place] [address]     Radiology Studies:  No results found.   Scheduled Meds:    acetaminophen   1,000 mg Oral Q8H   Chlorhexidine  Gluconate Cloth  6 each Topical Daily   cyclobenzaprine  10 mg Oral TID   dexamethasone  1 mg Oral Daily   heparin injection (subcutaneous)  5,000 Units Subcutaneous Q8H  methocarbamol  500 mg Oral TID   multivitamin with minerals  1 tablet Oral Daily   oxyCODONE   30 mg Oral Q12H   polyethylene glycol  17 g Oral BID   Ensure Max Protein  11 oz Oral BID   senna-docusate  2 tablet Oral BID   sodium chloride  flush  10-40 mL Intracatheter Q12H   sodium chloride  flush  3 mL Intravenous Q12H    Continuous Infusions:     LOS: 10 days   Joette Pebbles, MD,    To contact the attending provider between 7A-7P or the covering provider during after hours 7P-7A, please log into the web site [email or web address] and access using universal [place] password for that web site. If you do not have the password, please call the hospital operator.  05/12/2024, 9:23 AM

## 2024-05-12 NOTE — Consult Note (Signed)
 WOC Nurse Consult Note: Refer to neurosurgical team notes; they were in earlier this morning to remove dressing and assess wound appearance.  Requested to place Prevena dressing and machine.  Upper back with 12X2cm full thickness post-op incision, intact with well-approximated sutures to the location; no open wound, drainage, or fluctuance.  Darker-colored skin in the center.   Applied Prevena dressing and attached to home Prevena suction machine.  Pt tolerated with minimal amt discomfort.  Prevena dressings are designed to remain in place for 7 days, then be removed and discarded.   Please re-consult if further assistance is needed.  Thank-you,  Stephane Fought MSN, RN, CWOCN, CWCN-AP, CNS Contact Mon-Fri 0700-1500: [phone]

## 2024-05-13 DIAGNOSIS — C73 Malignant neoplasm of thyroid gland: Secondary | ICD-10-CM | POA: Diagnosis not present

## 2024-05-13 DIAGNOSIS — I2699 Other pulmonary embolism without acute cor pulmonale: Secondary | ICD-10-CM | POA: Diagnosis not present

## 2024-05-13 DIAGNOSIS — Z7189 Other specified counseling: Secondary | ICD-10-CM

## 2024-05-13 LAB — CBC
HCT: 31.3 % — ABNORMAL LOW (ref 39.0–52.0)
Hemoglobin: 10.1 g/dL — ABNORMAL LOW (ref 13.0–17.0)
MCH: 30.1 pg (ref 26.0–34.0)
MCHC: 32.3 g/dL (ref 30.0–36.0)
MCV: 93.4 fL (ref 80.0–100.0)
Platelets: UNDETERMINED K/uL (ref 150–400)
RBC: 3.35 MIL/uL — ABNORMAL LOW (ref 4.22–5.81)
RDW: 14.1 % (ref 11.5–15.5)
WBC: 20 K/uL — ABNORMAL HIGH (ref 4.0–10.5)
nRBC: 0 % (ref 0.0–0.2)

## 2024-05-13 LAB — APTT: aPTT: 69 s — ABNORMAL HIGH (ref 24–36)

## 2024-05-13 LAB — HEPARIN LEVEL (UNFRACTIONATED)
Heparin Unfractionated: 0.25 [IU]/mL — ABNORMAL LOW (ref 0.30–0.70)
Heparin Unfractionated: 0.39 [IU]/mL (ref 0.30–0.70)

## 2024-05-13 MED ORDER — ALTEPLASE 2 MG IJ SOLR
2.0000 mg | Freq: Once | INTRAMUSCULAR | Status: AC
  Administered 2024-05-13: 2 mg
  Filled 2024-05-13: qty 2

## 2024-05-13 MED ORDER — OXYCODONE HCL ER 15 MG PO T12A
90.0000 mg | EXTENDED_RELEASE_TABLET | Freq: Three times a day (TID) | ORAL | Status: DC
  Administered 2024-05-13 – 2024-05-16 (×9): 90 mg via ORAL
  Filled 2024-05-13 (×9): qty 6

## 2024-05-13 NOTE — Progress Notes (Signed)
 Assessment 43 y/o M w/ hx morbid obesity, substance abuse who presents with pathologic T2 osteolytic fracture, high grade ESCC, progressive thoracic myelopathy, unknown metastatic primary seemingly thyroid  vs pulm primary. Underwent posterior T1-3 instrumentation fusion T2 corpectomy for resection of tumor (11/13). Pathology shows thyroid  papillary carcinoma  LOS: 11 days    Plan: Decadron  wean in place Activity as tolerated. No brace needed. PTOT Diet as tolerated Ok for DVT ppx now Once heparin  is in therapeutic range, if patient is neurologically stable, ok to transition to Eliquis  and discharge.  Will plan for removal of sutures 3 weeks postop. I would like to have his wound checked prior to administering radiation or systemic therapy Wound VAC removed. Wound team will place the prevena. He will go home with it and take it off after 7 days when the battery runs out Please notify neurosurgery if he develops leg weakness beyond the current state Neurosurgery team to sign off. Thank you for allowing us  to participate in the care of this patient. Please do not hesitate to call us  with questions or concerns    Subjective: Heparin  gtt started yesterday. Not yet therapeutic as of 0500.   Objective: Vital signs in last 24 hours: Temp:  [97.8 F (36.6 C)-99.2 F (37.3 C)] 99.1 F (37.3 C) (11/21 0408) Pulse Rate:  [90-105] 102 (11/21 0408) Resp:  [8-15] 11 (11/21 0408) BP: (96-167)/(64-81) 126/72 (11/21 0408) SpO2:  [93 %-97 %] 93 % (11/21 0408)  Intake/Output from previous day: 11/20 0701 - 11/21 0700 In: 254.2 [I.V.:254.2] Out: 1150 [Urine:1150] Intake/Output this shift: No intake/output data recorded.  Exam: RUE: 5/5 grip LUE: 5/5 grip BLE: 4/5 throughout Improved sensation BLE compared to preop Incisional vac in place  Lab Results: Recent Labs    05/12/24 0609 05/13/24 0500  WBC 19.4* 20.0*  HGB 10.0* 10.1*  HCT 31.4* 31.3*  PLT PLATELET CLUMPS NOTED ON SMEAR,  UNABLE TO ESTIMATE PENDING   BMET Recent Labs    05/11/24 1324 05/12/24 0609  NA 133* 134*  K 4.0 3.8  CL 97* 100  CO2 23 24  GLUCOSE 158* 132*  BUN 15 14  CREATININE 0.74 0.84  CALCIUM 8.1* 8.3*       Dorn SAUNDERS Godwin Tedesco 05/13/2024, 7:18 AM

## 2024-05-13 NOTE — Progress Notes (Addendum)
 PHARMACY - ANTICOAGULATION CONSULT NOTE  Pharmacy Consult for heparin   Indication: pulmonary embolus  No Known Allergies  Patient Measurements: Height: 5' 9 (175.3 cm) Weight: (!) 143.5 kg (316 lb 5.8 oz) IBW/kg (Calculated) : 70.7 HEPARIN  DW (KG): 104.9  Vital Signs: Temp: 99.1 F (37.3 C) (11/21 0408) Temp Source: Oral (11/20 2323) BP: 126/72 (11/21 0408) Pulse Rate: 102 (11/21 0408)  Labs: Recent Labs    05/11/24 1324 05/12/24 0609 05/12/24 1840 05/12/24 1930 05/13/24 0500  HGB 9.8* 10.0*  --   --  10.1*  HCT 29.8* 31.4*  --   --  31.3*  PLT PLATELET CLUMPS NOTED ON SMEAR, UNABLE TO ESTIMATE PLATELET CLUMPS NOTED ON SMEAR, UNABLE TO ESTIMATE  --   --  PENDING  APTT 34 35  --   --  69*  LABPROT 15.8*  --   --   --   --   INR 1.2  --   --   --   --   HEPARINUNFRC  --   --  >1.10* 0.28* 0.25*  CREATININE 0.74 0.84  --   --   --     Estimated Creatinine Clearance: 160.1 mL/min (by C-G formula based on SCr of 0.84 mg/dL).   Medical History: Past Medical History:  Diagnosis Date   Anxiety    Asthma    as a child   Depression    Narcotic abuse (HCC)    Pulmonary nodules 04/29/2024    Assessment: Patient admitted with CC of back pain. Found to have acute PE on 11/6. Started on Eliquis  which was stopped on 11/9. Found to have T2 fracture with compressive myelopathy and C6 lesion. Patient underwent a T1-T3 instrumentation fusion T2 for resection of tumor on 11/13 with pathology showing thyroid  papillary carcinoma. Tumor was noted to be hemorrhagic intraoperatively, NS recommending heparin  w/o bolus.   HgB 10.1 and PLTs unable to be quantified due to clumping. Heparin  level subtherapeutic this AM at 0.25 despite rate increase to 1500u/hr of heparin . No issues noted per RN.   Goal of Therapy:  Heparin  level 0.3-0.5  Monitor platelets by anticoagulation protocol: Yes   Plan:  Increase heparin  to 1650 units / hr  Follow up 6 hour labs Monitor closely for s/sx of  bleeding, Eliquis  and Xarelto co-pay $4.00.   Powell Blush, PharmD, BCCCP  05/13/2024,7:20 AM

## 2024-05-13 NOTE — Progress Notes (Signed)
 Inpatient Rehab Admissions Coordinator:   Insurance auth pending.  Discussed with Dr. Babs and Dr. Urbano this AM.  Pt has excellent family support.  Plan to transition to eliquis  in the next day or so.  Will watch for WBC to trend down and f/u on Monday.   Reche Lowers, PT, DPT Admissions Coordinator [phone] 05/13/24 10:52 AM

## 2024-05-13 NOTE — Progress Notes (Signed)
 PHARMACY - ANTICOAGULATION CONSULT NOTE  Pharmacy Consult for heparin   Indication: pulmonary embolus  No Known Allergies  Patient Measurements: Height: 5' 9 (175.3 cm) Weight: (!) 143.5 kg (316 lb 5.8 oz) IBW/kg (Calculated) : 70.7 HEPARIN  DW (KG): 104.9  Vital Signs: Temp: 98.6 F (37 C) (11/21 1205) Temp Source: Oral (11/21 1205) BP: 167/79 (11/21 1205) Pulse Rate: 104 (11/21 1205)  Labs: Recent Labs    05/11/24 1324 05/12/24 0609 05/12/24 1840 05/12/24 1930 05/13/24 0500 05/13/24 1330  HGB 9.8* 10.0*  --   --  10.1*  --   HCT 29.8* 31.4*  --   --  31.3*  --   PLT PLATELET CLUMPS NOTED ON SMEAR, UNABLE TO ESTIMATE PLATELET CLUMPS NOTED ON SMEAR, UNABLE TO ESTIMATE  --   --  PLATELET CLUMPS NOTED ON SMEAR, UNABLE TO ESTIMATE  --   APTT 34 35  --   --  69*  --   LABPROT 15.8*  --   --   --   --   --   INR 1.2  --   --   --   --   --   HEPARINUNFRC  --   --    < > 0.28* 0.25* 0.39  CREATININE 0.74 0.84  --   --   --   --    < > = values in this interval not displayed.    Estimated Creatinine Clearance: 160.1 mL/min (by C-G formula based on SCr of 0.84 mg/dL).   Medical History: Past Medical History:  Diagnosis Date   Anxiety    Asthma    as a child   Depression    Narcotic abuse (HCC)    Pulmonary nodules 04/29/2024    Assessment: Patient admitted with CC of back pain. Found to have acute PE on 11/6. Started on Eliquis  which was stopped on 11/9. Found to have T2 fracture with compressive myelopathy and C6 lesion. Patient underwent a T1-T3 instrumentation fusion T2 for resection of tumor on 11/13 with pathology showing thyroid  papillary carcinoma. Tumor was noted to be hemorrhagic intraoperatively, NS recommending heparin  w/o bolus.   HgB 10.1 and PLTs unable to be quantified due to clumping. Heparin  level therapeutic at 0.39. No issues noted with infusion.   Goal of Therapy:  Heparin  level 0.3-0.5  Monitor platelets by anticoagulation protocol: Yes    Plan:  Continue heparin  to 1650 units / hr  Confirmatory heparin  level in AM.  Monitor closely for s/sx of bleeding, Eliquis  and Xarelto co-pay $4.00.   Powell Blush, PharmD, BCCCP  05/13/2024,2:57 PM

## 2024-05-13 NOTE — Progress Notes (Signed)
 Daily Progress Note   Date: 05/13/2024   Patient Name: Joseph [name]  DOB: Apr 30, 1981  MRN: [id number]  Age / Sex: 43 y.o., male  Attending Physician: Verdene Purchase, MD Primary Care Physician: Patient, No Pcp Per Admit Date: 04/27/2024 Length of Stay: 11 days  Reason for Follow-up: Establishing goals of care  Past Medical History:  Diagnosis Date   Anxiety    Asthma    as a child   Depression    Narcotic abuse (HCC)    Pulmonary nodules 04/29/2024    Subjective:   Subjective: Chart Reviewed. Updates received. Patient Assessed. Created space and opportunity for patient  and family to explore thoughts and feelings regarding current medical situation.  Today's Discussion: Today before meeting with the patient/family, I reviewed the chart notes including neurosurgery note from today, internal medicine note from today, rehab admission coordinator note from today, PT and OT notes from today. I also reviewed vital signs, nursing flowsheets, medication administrations record, labs, and imaging. Labs reviewed include CBC which shows white blood cell count remains elevated but stable at 20.0 postop in the setting of metastatic cancer as well as PE with continued low-grade fever likely multifactorial and also in the setting of steroids.  Prior to seeing the patient I did extensive review of his medication administration to aid in titration of pain medication for pain management in the setting of cancer related pain.  Today saw the patient at bedside, no family was present.  We talked about his work with PT and OT today.  He states that premedication helped, but afterward he was having some significant soreness.  Overall he feels his pain is a little bit better controlled today than yesterday.  We spent time talking about the details of his pain, onset, timing, duration, and other important factors to help guide medication titration.  We again talked about philosophy of pain management  where we aim to control pain but balance with the dual goals of managing pain but promoting functionality and rehab.  He understands his pain will not be completely removed but we hope to make it tolerable.  We also talked about the critical importance of avoiding any drugs while on pain medications due to risk of overdose and adverse effects.  We also talked about likelihood of a pain contract to be instituted as an outpatient with pain management.  He verbalized understanding and agreement with all this.  I shared that I would make adjustments to his pain medications based on this information and review his chart.  I shared that somebody from palliative medicine will follow over the weekend and our team would remain engaged while he is in the hospital. I provided emotional and general support through therapeutic listening, empathy, sharing of stories, and other techniques. I answered all questions and addressed all concerns to the best of my ability.  After seeing the patient I consulted with my colleague Dr. Tinnie Radar also on the palliative medicine team for medication adjustments.  Review of Systems  Respiratory:  Negative for shortness of breath.   Cardiovascular:  Negative for chest pain.  Gastrointestinal:  Negative for abdominal pain, nausea and vomiting.  Musculoskeletal:  Positive for back pain (Upper back near shoulder blades and slightly lower).       Shoulder pain    Objective:   Primary Diagnoses: Present on Admission:  Acute pulmonary embolism (HCC)  Thoracic spine fracture (HCC)   Vital Signs:  BP (!) 167/79 (BP Location: Left Leg)  Pulse (!) 104   Temp 98.6 F (37 C) (Oral)   Resp 13   Ht 5' 9 (1.753 m)   Wt (!) 143.5 kg   SpO2 92%   BMI 46.72 kg/m   Physical Exam Vitals and nursing note reviewed.  Constitutional:      General: He is not in acute distress.    Appearance: He is not toxic-appearing.  HENT:     Head: Normocephalic and atraumatic.   Cardiovascular:     Rate and Rhythm: Normal rate.  Pulmonary:     Effort: Pulmonary effort is normal. No respiratory distress.     Breath sounds: No wheezing or rhonchi.  Abdominal:     General: Abdomen is flat. Bowel sounds are normal. There is no distension.     Palpations: Abdomen is soft.     Tenderness: There is no abdominal tenderness.  Skin:    General: Skin is warm and dry.  Neurological:     General: No focal deficit present.     Mental Status: He is alert and oriented to person, place, and time.  Psychiatric:        Mood and Affect: Mood normal.        Behavior: Behavior normal.     Palliative Assessment/Data: 60%   Existing Vynca/ACP Documentation: None  Assessment & Plan:   HPI/Patient Profile:  43 y.o. male  with past medical history of no significant medical history presented to the hospital with about 4 days of back pain and difficulty walking, he was having some back pain issues for about 2 weeks.  Workup revealed pathological T2 compression fracture and large left thyroid  nodule for which she was transferred to [facility] from [facility] for neurosurgery.  He was admitted on 04/27/2024 with metastatic papillary thyroid  cancer, T2 pathological fracture with compression myelopathy, C6 lucent lesion suspected metastatic disease, left thyroid  enlargement, pulmonary nodules suspected metastatic disease, right upper lobe segmental PE, pulmonary artery hypertension, acute blood loss anemia, thrombocytopenia, chronic right shoulder pain, constipation, and others.    Palliative medicine was consulted for assistance with cancer associated pain management.  SUMMARY OF RECOMMENDATIONS   Full code Full scope of care Use oxycodone  IR first for moderate to severe pain IV Dilaudid  as a rescue medication for ineffective oxycodone  IR See medication ingestion calculations below See full symptom management below Referral for outpatient palliative care in the cancer  center Palliative medicine will follow-up tomorrow for ongoing adjustment  Within the past 24 hours hours, patient has required 6 mg of IV Dilaudid , 60 mg of as needed oral oxycodone , and 180 mg of oral OxyContin  for opioid management. Based on OMEs calculated for this dose, which would be approximately 375 OME, when reducing by 50% for incomplete cross tolerance, will appropriately start patient on the listed regimen below.   Increase OxyContin  to 90 mg every 8 hours with last dose at 10 PM Continue oxycodone  20 mg every 4 hours as needed moderate to severe pain Continue Dilaudid  1 mg IV every 3 hours as needed breakthrough pain after oxycodone   Symptom Management:  Tylenol  1000 mg p.o. every 8 hours Flexeril  10 mg p.o. 3 times daily Robaxin  500 mg p.o. 3 times daily OxyContin  90 mg p.o. every 8 hours Oxycodone  IR 20 mg p.o. every 4 hours as needed moderate to severe pain Dilaudid  1 mg IV every 3 hours as needed breakthrough severe pain not relieved by oxycodone  IR Dulcolax EC 5 mg p.o. daily as needed moderate constipation MiraLAX   17 g p.o. twice daily Senokot S 2 tablets p.o. twice daily  Code Status: Full Code  Prognosis: Unable to determine  Discharge Planning: To Be Determined  Discussed with: Patient, friend, medical team, nursing team  Thank you for allowing us  to participate in the care of PHENIX VANDERMEULEN PMT will continue to support holistically.  Billing based on MDM: High  Problems Addressed: One acute or chronic illness or injury that poses a threat to life or bodily function  Risks: Parenteral controlled substances  Detailed review of medical records (labs, imaging, vital signs), medically appropriate exam, discussed with treatment team, counseling and education to patient, family, & staff, documenting clinical information, medication management, coordination of care  Camellia Kays, NP Palliative Medicine Team  Team Phone # [phone] (Nights/Weekends)   02/19/2021, 8:17 AM

## 2024-05-13 NOTE — Progress Notes (Signed)
 Orthopedic Tech Progress Note Patient Details:  Joseph [name] 10-07-1980 [id number]  Ortho Devices Type of Ortho Device: Arm sling Ortho Device/Splint Location: RUE Ortho Device/Splint Interventions: Ordered, Application   Post Interventions Patient Tolerated: Well  Allicia Culley A Taiyana Kissler 05/13/2024, 10:29 AM

## 2024-05-13 NOTE — Progress Notes (Signed)
 PROGRESS NOTE   Joseph [name]  FMW:[id number]    DOB: 02-18-1981    DOA: 04/27/2024  PCP: Patient, No Pcp Per    Brief Hospital Course:  43 year old gentleman with no significant medical history presented to the hospital with about 4 days of back pain and difficulty walking, he was having some back pain issues for about 2 weeks.  CT scan of the lumbar spine was unrevealing.  CTA chest PE protocol showed right upper lobe segmental PE. He was started on Eliquis . Patient continued to have difficulty with mobility.  Multiple investigations were done.  MRI of the thoracic spine ultimately showed pathological T2 compression fracture. Thyroid  ultrasound showed large left thyroid  nodule.  He was transferred to [facility] from [place] for Neurosurgery.  11/12, IR performed US  guided left lobe thyroid  nodule biopsy.  11/13: S/p posterior T1-3 instrumentation fusion T2 corpectomy for resection of tumor.  Thyroid  biopsy showed papillary carcinoma, oncology consulted and seen on 11/14 and plan to follow-up this week.   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  11/13: S/p posterior T1-3 instrumentation fusion T2 corpectomy for resection of tumor.  Extensive procedure.  Hemorrhagic tumor.  1500 mL EBL.  Got 2 units of PRBC across surgery.   JP drain removed.  Wound VAC to stay until time of discharge at which point, surgeons plan to apply a 7-day Prevena at discharge. Heparin  DVT prophylaxis started 11/17.   Thyroid  biopsy pathology confirms papillary carcinoma.   Patient seen by medical oncology.  They have placed referral to ENT and endocrinology.  Radiation oncology referral was placed by neurosurgery.  All of this will be  accomplished in the outpatient setting. Seen by therapy and they recommend inpatient rehabilitation. Dexamethasone  has been weaned off. Outpatient follow-up with neurosurgery  Right upper lobe segmental PE Pulmonary artery hypertension Anticoagulation was on hold due to surgery.   Per neurosurgery, okay for therapeutic anticoagulation on 11/20.  They recommend starting with heparin  infusion for 48 to 48 hours and then transition to apixaban  if no bleeding complications.   Patient was initiated on heparin  on 11/20.  Somewhat of a challenging situation since platelet counts have not been available on CBC.  Clumps have been noted. If patient remains stable over the next 24 hours and without any evidence for overt bleeding, we will transition him to Eliquis  tomorrow.  Fever/leukocytosis Continues to have low-grade fever.  Likely multifactorial.  Could be due to his pulmonary embolism.  Steroids also contributing. UA was unremarkable.  Chest x-ray did not show any pneumonia. Continue to hold off on antibiotics.  Hopefully WBC will start trending downwards.    Acute blood loss anemia As noted above, EBL 1500 mL during neurosurgical procedure 11/13 and got 2 units PRBC.  Hemoglobin stable for the last several days.  Thrombocytopenia Platelet clumping on daily serial CBCs and counts not known.  Right shoulder pain, chronic Pain predates his current surgery.  Denies any worsening since hospital admission.  Communicated with Orthopedics PA 11/17 and recommends outpatient follow-up for suspected rotator cuff pathology.  PT evaluation.  Constipation Continue bowel regimen.  Patient did have a bowel movement on 11/18.    Body mass index is 46.72 kg/m./Very morbid obesity class III Complicates care.  Outpatient follow-up.   DVT prophylaxis: Subcutaneous heparin   Code Status: Full Code:  Family Communication: None at bedside.   Disposition: Inpatient rehabilitation is being  pursued.   Consultants:   Neurosurgery Neurology Interventional radiology Medical oncology  Procedures:   As above Difficult LUE PICC line placement by PICC team on 11/15.  Subjective:  No complaints offered.  Feels well.  Denies any bleeding.    Objective:   Vitals:   05/12/24 1558 05/12/24 2323 05/13/24 0023 05/13/24 0408  BP: (!) 152/64 112/65  126/72  Pulse: 96 (!) 105 (!) 104 (!) 102  Resp: 13 (!) 8 15 11   Temp: 98.4 F (36.9 C) 99.2 F (37.3 C)  99.1 F (37.3 C)  TempSrc: Oral Oral    SpO2: 96% 97% 95% 93%  Weight:      Height:        General appearance: Awake alert.  In no distress Resp: Clear to auscultation bilaterally.  Normal effort Cardio: S1-S2 is normal regular.  No S3-S4.  No rubs murmurs or bruit GI: Abdomen is soft.  Nontender nondistended.  Bowel sounds are present normal.  No masses organomegaly     Data Reviewed:     CBC: Recent Labs  Lab 05/09/24 0550 05/09/24 0551 05/11/24 1324 05/12/24 0609 05/13/24 0500  WBC 19.9*   < > 19.3* 19.4* 20.0*  NEUTROABS 13.9*  --   --   --   --   HGB 11.1*   < > 9.8* 10.0* 10.1*  HCT 33.3*   < > 29.8* 31.4* 31.3*  MCV 90.5   < > 92.5 93.5 93.4  PLT PLATELET CLUMPS NOTED ON SMEAR, UNABLE TO ESTIMATE   < > PLATELET CLUMPS NOTED ON SMEAR, UNABLE TO ESTIMATE PLATELET CLUMPS NOTED ON SMEAR, UNABLE TO ESTIMATE PLATELET CLUMPS NOTED ON SMEAR, UNABLE TO ESTIMATE   < > = values in this interval not displayed.    Basic Metabolic Panel: Recent Labs  Lab 05/07/24 0349 05/11/24 1324 05/12/24 0609  NA 135 133* 134*  K 4.1 4.0 3.8  CL 101 97* 100  CO2 24 23 24   GLUCOSE 139* 158* 132*  BUN 14 15 14   CREATININE 0.78 0.74 0.84  CALCIUM 8.7* 8.1* 8.3*    Liver Function Tests: Recent Labs  Lab 05/07/24 0349  AST 21  ALT 29  ALKPHOS 46  BILITOT 1.2  PROT 6.8  ALBUMIN  3.1*     Microbiology Studies:   Recent Results (from the past 240 hours)  Surgical pcr screen     Status: Abnormal    Collection Time: 05/04/24  6:45 PM   Specimen: Nasal Mucosa; Nasal Swab  Result Value Ref Range Status   MRSA, PCR NEGATIVE NEGATIVE Final   Staphylococcus aureus POSITIVE (A) NEGATIVE Final    Comment: (NOTE) The Xpert SA Assay (FDA approved for NASAL specimens in patients 59 years of age and older), is one component of a comprehensive surveillance program. It is not intended to diagnose infection nor to guide or monitor treatment. Performed at [facility], [address], [place], [place] [address]     Radiology Studies:  DG CHEST PORT 1 VIEW Result Date: 05/12/2024 EXAM: 1 VIEW(S) XRAY OF THE CHEST 05/12/2024 09:52:22 AM COMPARISON: 05/06/2024 CLINICAL HISTORY: Fever FINDINGS: LINES, TUBES AND DEVICES: Left PICC in place with tip overlying superior aspect of SVC. LUNGS AND PLEURA: Small bilateral pulmonary nodules, unchanged from previous exam. . No pleural effusion. No pneumothorax. HEART AND MEDIASTINUM: No acute abnormality of the cardiac and mediastinal silhouettes. BONES AND SOFT TISSUES:  Cervicothoracic surgical hardware. No acute osseous abnormality. IMPRESSION: 1. No acute cardiopulmonary pathology. 2. Small bilateral pulmonary nodules are similar to the previous exam, better visualized on CT of the chest from 04/02/2024. Electronically signed by: Tingler Stroud MD 05/12/2024 12:40 PM EST RP Workstation: [id number]     Scheduled Meds:    acetaminophen   1,000 mg Oral Q8H   Chlorhexidine  Gluconate Cloth  6 each Topical Daily   cyclobenzaprine   10 mg Oral TID   methocarbamol   500 mg Oral TID   multivitamin with minerals  1 tablet Oral Daily   oxyCODONE   60 mg Oral Q8H   polyethylene glycol  17 g Oral BID   Ensure Max Protein  11 oz Oral BID   senna-docusate  2 tablet Oral BID   sodium chloride  flush  10-40 mL Intracatheter Q12H   sodium chloride  flush  3 mL Intravenous Q12H    Continuous Infusions:    heparin  1,650 Units/hr (05/13/24 0725)    LOS: 11 days    Joette Pebbles, MD,    To contact the attending provider between 7A-7P or the covering provider during after hours 7P-7A, please log into the web site [email or web address] and access using universal Noel password for that web site. If you do not have the password, please call the hospital operator.  05/13/2024, 9:49 AM

## 2024-05-13 NOTE — Progress Notes (Signed)
 Physical Therapy Treatment Patient Details Name: Joseph [name] MRN: [id number] DOB: 1980/10/03 Today's Date: 05/13/2024   History of Present Illness Joseph [name] is a 43 yo male who presented with 4 days of back pain and difficulty walking.  CTA chest PE protocol showed right upper lobe segmental PE. MRI of the thoracic spine ultimately showed pathological T2 compression fracture. Thyroid  ultrasound showed large left thyroid  nodule. 11/12, IR performed US  guided left lobe thyroid  nodule biopsy.  11/13: S/p posterior T1-3 instrumentation fusion T2 corpectomy for resection of tumor.  Thyroid  biopsy showed papillary carcinoma. PMHx: none.    PT Comments  PT and OT saw pt together to attempt to progress mobility, working on standing and stepping. Pt remains significantly limited by RUE and back pain, tolerating x2 rises into semi-standing with max +2 assist but unable to progress to pre-gait or gait tasks. Pt requiring max +2 for lateral scoot to drop arm recliner towards R, focusing on head/hips mechanics. PT to continue to follow, progressing slowly.     If plan is discharge home, recommend the following: Two people to help with walking and/or transfers;Two people to help with bathing/dressing/bathroom   Can travel by private vehicle        Equipment Recommendations  Other (comment);Rolling walker (2 wheels)    Recommendations for Other Services       Precautions / Restrictions Precautions Precautions: Fall;Back Precaution Booklet Issued: No Recall of Precautions/Restrictions: Intact Precaution/Restrictions Comments: painful R shoulder, present prior to admission but pt states worse now Required Braces or Orthoses: Sling (RUE for pain mgmt) Restrictions Weight Bearing Restrictions Per Provider Order: Yes Other Position/Activity Restrictions: Dr. Darnella via secure chat 11/17 - WBAT all extremities     Mobility  Bed Mobility Overal bed mobility: Needs Assistance Bed Mobility:  Rolling, Sidelying to Sit Rolling: +2 for safety/equipment, +2 for physical assistance, Min assist Sidelying to sit: Mod assist Supine to sit: Used rails, HOB elevated     General bed mobility comments: cues for problem solving    Transfers Overall transfer level: Needs assistance Equipment used: Ambulation equipment used, 2 person hand held assist Transfers: Sit to/from Stand, Bed to chair/wheelchair/BSC Sit to Stand: +2 physical assistance, From elevated surface, Max assist          Lateral/Scoot Transfers: Max assist, +2 physical assistance, +2 safety/equipment General transfer comment: pt unable to obtain full upright standing despite max A +2. Max A +2 needed to laterally scoot from bed to recliner to the R    Ambulation/Gait                   Stairs             Wheelchair Mobility     Tilt Bed    Modified Rankin (Stroke Patients Only)       Balance Overall balance assessment: History of Falls, Needs assistance Sitting-balance support: Feet supported Sitting balance-Leahy Scale: Fair     Standing balance support: Bilateral upper extremity supported, During functional activity, Reliant on assistive device for balance Standing balance-Leahy Scale: Zero                              Communication Communication Communication: No apparent difficulties  Cognition Arousal: Alert Behavior During Therapy: WFL for tasks assessed/performed, Anxious   PT - Cognitive impairments: No apparent impairments  Following commands: Intact      Cueing Cueing Techniques: Verbal cues  Exercises      General Comments General comments (skin integrity, edema, etc.): ordered sling for pain mgmt      Pertinent Vitals/Pain Pain Assessment Pain Assessment: Faces Faces Pain Scale: Hurts whole lot Pain Location: R shoulder>back Pain Descriptors / Indicators: Constant, Discomfort, Grimacing, Guarding Pain  Intervention(s): Limited activity within patient's tolerance, Monitored during session, Repositioned    Home Living                          Prior Function            PT Goals (current goals can now be found in the care plan section) Acute Rehab PT Goals Patient Stated Goal: to get stronger and go home PT Goal Formulation: With patient Time For Goal Achievement: 05/23/24 Potential to Achieve Goals: Good Progress towards PT goals: Progressing toward goals    Frequency    Min 3X/week      PT Plan      Co-evaluation   Reason for Co-Treatment: Complexity of the patient's impairments (multi-system involvement);To address functional/ADL transfers;For patient/therapist safety PT goals addressed during session: Mobility/safety with mobility;Balance OT goals addressed during session: ADL's and self-care      AM-PAC PT 6 Clicks Mobility   Outcome Measure  Help needed turning from your back to your side while in a flat bed without using bedrails?: A Lot Help needed moving from lying on your back to sitting on the side of a flat bed without using bedrails?: A Lot Help needed moving to and from a bed to a chair (including a wheelchair)?: Total Help needed standing up from a chair using your arms (e.g., wheelchair or bedside chair)?: Total Help needed to walk in hospital room?: Total Help needed climbing 3-5 steps with a railing? : Total 6 Click Score: 8    End of Session Equipment Utilized During Treatment: Gait belt;Other (comment) (RUE sling) Activity Tolerance: Patient limited by fatigue Patient left: with call bell/phone within reach;in chair;with chair alarm set Nurse Communication: Mobility status;Need for lift equipment (stedy for back to bed) PT Visit Diagnosis: Unsteadiness on feet (R26.81);Other abnormalities of gait and mobility (R26.89);Difficulty in walking, not elsewhere classified (R26.2);Pain Pain - Right/Left: Right Pain - part of body:  Shoulder;Arm     Time: 8946-8880 PT Time Calculation (min) (ACUTE ONLY): 26 min  Charges:    $Therapeutic Activity: 8-22 mins PT General Charges $$ ACUTE PT VISIT: 1 Visit                     Johana RAMAN, PT DPT Acute Rehabilitation Services Secure Chat Preferred  Office [phone]    Cassie Shedlock E Johna 05/13/2024, 12:02 PM

## 2024-05-13 NOTE — Progress Notes (Addendum)
 Occupational Therapy Treatment Patient Details Name: Joseph [name] MRN: [id number] DOB: 16-May-1981 Today's Date: 05/13/2024   History of present illness Joseph [name] is a 43 yo male who presented with 4 days of back pain and difficulty walking.  CTA chest PE protocol showed right upper lobe segmental PE. MRI of the thoracic spine ultimately showed pathological T2 compression fracture. Thyroid  ultrasound showed large left thyroid  nodule. 11/12, IR performed US  guided left lobe thyroid  nodule biopsy.  11/13: S/p posterior T1-3 instrumentation fusion T2 corpectomy for resection of tumor.  Thyroid  biopsy showed papillary carcinoma. PMHx: none.   OT comments  Pt is making limited progress towards their acute OT goals. Ultimately he is limited by pain and general debility but is willing to participate. He needed min A+2 for bed mobility and max A +2 for 2 standing attempts. Pt tolerated a lateral scoot transfer from the bed to the recliner with max A +2. Total A +2 required for LB dressing. OT to continue to follow acutely to facilitate progress towards established goals. Pt will continue to benefit from intensive inpatient follow up therapy, >3 hours/day after discharge.         If plan is discharge home, recommend the following:  A lot of help with walking and/or transfers;Two people to help with walking and/or transfers;A lot of help with bathing/dressing/bathroom;Two people to help with bathing/dressing/bathroom   Equipment Recommendations  Other (comment)    Recommendations for Other Services Rehab consult    Precautions / Restrictions Precautions Precautions: Fall;Back Precaution Booklet Issued: No Recall of Precautions/Restrictions: Intact Precaution/Restrictions Comments: painful R shoulder, present prior to admission but pt states worse now Required Braces or Orthoses: Sling (RUE for pain mgmt) Restrictions Weight Bearing Restrictions Per Provider Order: Yes Other  Position/Activity Restrictions: Dr. Darnella via secure chat 11/17 - WBAT all extremities       Mobility Bed Mobility Overal bed mobility: Needs Assistance Bed Mobility: Supine to Sit Rolling: +2 for safety/equipment, +2 for physical assistance, Min assist         General bed mobility comments: cues for problem solving    Transfers Overall transfer level: Needs assistance Equipment used: Ambulation equipment used, 2 person hand held assist Transfers: Sit to/from Stand, Bed to chair/wheelchair/BSC Sit to Stand: +2 physical assistance, From elevated surface, Max assist          Lateral/Scoot Transfers: Max assist, +2 physical assistance, +2 safety/equipment General transfer comment: pt unable to obtain full upright standing despite max A +2. Max A +2 needed to laterally scoot from bed to recliner to the R     Balance Overall balance assessment: History of Falls, Needs assistance Sitting-balance support: Feet supported Sitting balance-Leahy Scale: Fair     Standing balance support: Bilateral upper extremity supported, During functional activity, Reliant on assistive device for balance Standing balance-Leahy Scale: Zero                             ADL either performed or assessed with clinical judgement   ADL Overall ADL's : Needs assistance/impaired                     Lower Body Dressing: Total assistance Lower Body Dressing Details (indicate cue type and reason): unable to get underwear on today despite total A +2 Toilet Transfer: +2 for physical assistance;+2 for safety/equipment;Maximal assistance Toilet Transfer Details (indicate cue type and reason): max A +2 for lateral scoot transfer  Functional mobility during ADLs: Maximal assistance;+2 for physical assistance;+2 for safety/equipment General ADL Comments: pt unable to obtain full upright posture in 2 standing attempts    Extremity/Trunk Assessment Upper Extremity Assessment Upper  Extremity Assessment: RUE deficits/detail RUE Deficits / Details: painful R shoulder with any movement or WBing, no sling present. Plans for shoulder MRI at outpatient follow up? RUE Sensation: WNL RUE Coordination: decreased gross motor   Lower Extremity Assessment Lower Extremity Assessment: Defer to PT evaluation        Vision   Vision Assessment?: No apparent visual deficits   Perception Perception Perception: Not tested   Praxis Praxis Praxis: Not tested   Communication Communication Communication: No apparent difficulties   Cognition Arousal: Alert Behavior During Therapy: WFL for tasks assessed/performed, Anxious Cognition: No apparent impairments                               Following commands: Intact        Cueing   Cueing Techniques: Verbal cues  Exercises      Shoulder Instructions       General Comments ordered sling for pain mgmt    Pertinent Vitals/ Pain       Pain Assessment Pain Assessment: Faces Faces Pain Scale: Hurts whole lot Pain Location: R shoulder>back Pain Descriptors / Indicators: Constant, Discomfort, Grimacing, Guarding Pain Intervention(s): Limited activity within patient's tolerance, Monitored during session  Home Living                                          Prior Functioning/Environment              Frequency  Min 2X/week        Progress Toward Goals  OT Goals(current goals can now be found in the care plan section)  Progress towards OT goals: Progressing toward goals  Acute Rehab OT Goals Patient Stated Goal: less pain OT Goal Formulation: With patient Time For Goal Achievement: 05/23/24 Potential to Achieve Goals: Good ADL Goals Pt Will Perform Grooming: with set-up;sitting Pt Will Perform Upper Body Bathing: with min assist;sitting Pt Will Transfer to Toilet: stand pivot transfer;bedside commode;with min assist;with +2 assist Pt Will Perform Toileting - Clothing  Manipulation and hygiene: with mod assist;sitting/lateral leans;sit to/from stand Additional ADL Goal #1: Pt to complete bed mobility with Min A in prep for EOB/OOB ADLs  Plan      Co-evaluation    PT/OT/SLP Co-Evaluation/Treatment: Yes Reason for Co-Treatment: Complexity of the patient's impairments (multi-system involvement);To address functional/ADL transfers;For patient/therapist safety   OT goals addressed during session: ADL's and self-care      AM-PAC OT 6 Clicks Daily Activity     Outcome Measure   Help from another person eating meals?: None Help from another person taking care of personal grooming?: A Little Help from another person toileting, which includes using toliet, bedpan, or urinal?: Total Help from another person bathing (including washing, rinsing, drying)?: A Lot Help from another person to put on and taking off regular upper body clothing?: A Lot Help from another person to put on and taking off regular lower body clothing?: Total 6 Click Score: 13    End of Session    OT Visit Diagnosis: Unsteadiness on feet (R26.81);Other abnormalities of gait and mobility (R26.89);Muscle weakness (generalized) (M62.81);History of falling (Z91.81);Pain Pain - Right/Left: Right  Pain - part of body: Shoulder   Activity Tolerance Patient limited by pain   Patient Left in chair;with call bell/phone within reach   Nurse Communication Mobility status        Time: 8946-8880 OT Time Calculation (min): 26 min  Charges: OT General Charges $OT Visit: 1 Visit OT Treatments $Self Care/Home Management : 8-22 mins  Lucie Kendall, OTR/L Acute Rehabilitation Services Office [phone] Secure Chat Communication Preferred   Lucie JONETTA Kendall 05/13/2024, 11:29 AM

## 2024-05-14 DIAGNOSIS — C73 Malignant neoplasm of thyroid gland: Secondary | ICD-10-CM | POA: Diagnosis not present

## 2024-05-14 DIAGNOSIS — I2699 Other pulmonary embolism without acute cor pulmonale: Secondary | ICD-10-CM | POA: Diagnosis not present

## 2024-05-14 LAB — BASIC METABOLIC PANEL WITH GFR
Anion gap: 10 (ref 5–15)
BUN: 14 mg/dL (ref 6–20)
CO2: 24 mmol/L (ref 22–32)
Calcium: 7.8 mg/dL — ABNORMAL LOW (ref 8.9–10.3)
Chloride: 100 mmol/L (ref 98–111)
Creatinine, Ser: 0.68 mg/dL (ref 0.61–1.24)
GFR, Estimated: >60 mL/min (ref >60)
Glucose, Bld: 121 mg/dL — ABNORMAL HIGH (ref 70–99)
Potassium: 3.6 mmol/L (ref 3.5–5.1)
Sodium: 134 mmol/L — ABNORMAL LOW (ref 135–145)

## 2024-05-14 LAB — CBC
HCT: 30.8 % — ABNORMAL LOW (ref 39.0–52.0)
Hemoglobin: 9.8 g/dL — ABNORMAL LOW (ref 13.0–17.0)
MCH: 29.7 pg (ref 26.0–34.0)
MCHC: 31.8 g/dL (ref 30.0–36.0)
MCV: 93.3 fL (ref 80.0–100.0)
Platelets: UNDETERMINED K/uL (ref 150–400)
RBC: 3.3 MIL/uL — ABNORMAL LOW (ref 4.22–5.81)
RDW: 14.1 % (ref 11.5–15.5)
WBC: 14.9 K/uL — ABNORMAL HIGH (ref 4.0–10.5)
nRBC: 0 % (ref 0.0–0.2)

## 2024-05-14 LAB — APTT: aPTT: 67 s — ABNORMAL HIGH (ref 24–36)

## 2024-05-14 LAB — PHOSPHORUS: Phosphorus: 3.7 mg/dL (ref 2.5–4.6)

## 2024-05-14 LAB — MAGNESIUM: Magnesium: 1.5 mg/dL — ABNORMAL LOW (ref 1.7–2.4)

## 2024-05-14 MED ORDER — MAGNESIUM SULFATE 4 GM/100ML IV SOLN
4.0000 g | Freq: Once | INTRAVENOUS | Status: AC
  Administered 2024-05-14: 4 g via INTRAVENOUS
  Filled 2024-05-14: qty 100

## 2024-05-14 MED ORDER — APIXABAN 5 MG PO TABS: 5.0000 mg | ORAL_TABLET | Freq: Two times a day (BID) | ORAL | Status: DC

## 2024-05-14 MED ORDER — APIXABAN 5 MG PO TABS
10.0000 mg | ORAL_TABLET | Freq: Two times a day (BID) | ORAL | Status: DC
  Administered 2024-05-14 – 2024-05-16 (×5): 10 mg via ORAL
  Filled 2024-05-14 (×5): qty 2

## 2024-05-14 NOTE — Plan of Care (Signed)

## 2024-05-14 NOTE — Progress Notes (Signed)
 PROGRESS NOTE   Joseph [name]  FMW:[id number]    DOB: 1980-08-09    DOA: 04/27/2024  PCP: Patient, No Pcp Per    Brief Hospital Course:  43 year old gentleman with no significant medical history presented to the hospital with about 4 days of back pain and difficulty walking, he was having some back pain issues for about 2 weeks.  CT scan of the lumbar spine was unrevealing.  CTA chest PE protocol showed right upper lobe segmental PE. He was started on Eliquis . Patient continued to have difficulty with mobility.  Multiple investigations were done.  MRI of the thoracic spine ultimately showed pathological T2 compression fracture. Thyroid  ultrasound showed large left thyroid  nodule.  He was transferred to [facility] from [place] for Neurosurgery.  11/12, IR performed US  guided left lobe thyroid  nodule biopsy.  11/13: S/p posterior T1-3 instrumentation fusion T2 corpectomy for resection of tumor.  Thyroid  biopsy showed papillary carcinoma, oncology consulted and seen on 11/14 and plan to follow-up this week.   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  11/13: S/p posterior T1-3 instrumentation fusion T2 corpectomy for resection of tumor.  Extensive procedure.  Hemorrhagic tumor.  1500 mL EBL.  Got 2 units of PRBC across surgery.   JP drain removed.  Wound VAC to stay until time of discharge at which point, surgeons plan to apply a 7-day Prevena at discharge. Heparin  DVT prophylaxis started 11/17.   Thyroid  biopsy pathology confirms papillary carcinoma.   Patient seen by medical oncology.  They have placed referral to ENT and endocrinology.  Radiation oncology referral was placed by neurosurgery.  All of this will be  accomplished in the outpatient setting. Seen by therapy and they recommend inpatient rehabilitation. Dexamethasone  has been weaned off. Outpatient follow-up with neurosurgery  Right upper lobe segmental PE Pulmonary artery hypertension Anticoagulation was on hold due to surgery.   Subsequently cleared by neurosurgery to initiate anticoagulation.  Patient was started on IV heparin  which he has tolerated well in the last 48 hours without any overt signs of bleeding.  Okay to transition to apixaban  today. Somewhat of a challenging situation since platelet counts have not been available on CBC.  Clumps have been noted. Continue to monitor for signs of bleeding.  Fever/leukocytosis Continues to have low-grade fever.  Likely multifactorial.  Could be due to his pulmonary embolism.  Steroids also contributing. UA was unremarkable.  Chest x-ray did not show any pneumonia. WBC finally trending downwards.  Remains afebrile.  Hypomagnesemia Will be supplemented.  Acute blood loss anemia As noted above, EBL 1500 mL during neurosurgical procedure 11/13 and got 2 units PRBC.  Hemoglobin stable for the last several days.  Thrombocytopenia Platelet clumping on daily serial CBCs and counts not known.  Right shoulder pain, chronic Pain predates his current surgery.  Denies any worsening since hospital admission.  Communicated with Orthopedics PA 11/17 and recommends outpatient follow-up for suspected rotator cuff pathology.  PT evaluation.  Constipation Continue bowel regimen.  Patient did have a bowel movement on 11/18.    Body mass index is 46.72 kg/m./Very morbid obesity class III Complicates care.  Outpatient follow-up.   DVT prophylaxis: Subcutaneous heparin    Code Status: Full Code:  Family Communication: None at bedside.   Disposition: Inpatient rehabilitation is being pursued.  Consultants:   Neurosurgery Neurology Interventional radiology Medical oncology  Procedures:   As  above Difficult LUE PICC line placement by PICC team on 11/15.  Subjective:  Feels well.  Pain is reasonably well-controlled.  Has not noted any bleeding from any site.  Agrees with plan to change to oral anticoagulants.  Objective:   Vitals:   05/13/24 0408 05/13/24 1205 05/13/24 1657 05/13/24 2030  BP: 126/72 (!) 167/79 (!) 153/84 129/67  Pulse: (!) 102 (!) 104 (!) 107   Resp: 11 13 16    Temp: 99.1 F (37.3 C) 98.6 F (37 C) 99.2 F (37.3 C) 98.2 F (36.8 C)  TempSrc:  Oral Axillary Oral  SpO2: 93% 92% 93%   Weight:      Height:        General appearance: Awake alert.  In no distress Resp: Clear to auscultation bilaterally.  Normal effort Cardio: S1-S2 is normal regular.  No S3-S4.  No rubs murmurs or bruit GI: Abdomen is soft.  Nontender nondistended.  Bowel sounds are present normal.  No masses organomegaly    Data Reviewed:     CBC: Recent Labs  Lab 05/09/24 0550 05/09/24 0551 05/12/24 0609 05/13/24 0500 05/14/24 0745  WBC 19.9*   < > 19.4* 20.0* 14.9*  NEUTROABS 13.9*  --   --   --   --   HGB 11.1*   < > 10.0* 10.1* 9.8*  HCT 33.3*   < > 31.4* 31.3* 30.8*  MCV 90.5   < > 93.5 93.4 93.3  PLT PLATELET CLUMPS NOTED ON SMEAR, UNABLE TO ESTIMATE   < > PLATELET CLUMPS NOTED ON SMEAR, UNABLE TO ESTIMATE PLATELET CLUMPS NOTED ON SMEAR, UNABLE TO ESTIMATE PLATELET CLUMPS NOTED ON SMEAR, UNABLE TO ESTIMATE   < > = values in this interval not displayed.    Basic Metabolic Panel: Recent Labs  Lab 05/11/24 1324 05/12/24 0609 05/14/24 0745  NA 133* 134* 134*  K 4.0 3.8 3.6  CL 97* 100 100  CO2 23 24 24   GLUCOSE 158* 132* 121*  BUN 15 14 14   CREATININE 0.74 0.84 0.68  CALCIUM 8.1* 8.3* 7.8*  MG  --   --  1.5*  PHOS  --   --  3.7      Microbiology Studies:   Recent Results (from the past 240 hours)  Surgical pcr screen     Status: Abnormal   Collection Time: 05/04/24  6:45 PM   Specimen: Nasal Mucosa; Nasal Swab  Result Value Ref Range Status    MRSA, PCR NEGATIVE NEGATIVE Final   Staphylococcus aureus POSITIVE (A) NEGATIVE Final    Comment: (NOTE) The Xpert SA Assay (FDA approved for NASAL specimens in patients 43 years of age and older), is one component of a comprehensive surveillance program. It is not intended to diagnose infection nor to guide or monitor treatment. Performed at [facility], [address] [place], [place] [address]     Radiology Studies:  No results found.    Scheduled Meds:    acetaminophen   1,000 mg Oral Q8H   apixaban   10 mg Oral BID   Followed by   NOREEN ON 05/21/2024] apixaban   5 mg Oral BID   Chlorhexidine  Gluconate Cloth  6 each Topical Daily   cyclobenzaprine   10 mg Oral TID   methocarbamol   500 mg Oral TID   multivitamin with minerals  1 tablet Oral Daily   oxyCODONE   90 mg Oral Q8H   polyethylene glycol  17 g Oral BID   Ensure Max Protein  11 oz Oral BID   senna-docusate  2 tablet Oral BID   sodium chloride  flush  10-40 mL Intracatheter Q12H   sodium chloride  flush  3 mL Intravenous Q12H    Continuous Infusions:    magnesium  sulfate bolus IVPB 4 g (05/14/24 0938)    LOS: 12 days   Joette Pebbles, MD,    To contact the attending provider between 7A-7P or the covering provider during after hours 7P-7A, please log into the web site [email or web address] and access using universal [place] password for that web site. If you do not have the password, please call the hospital operator.  05/14/2024, 10:08 AM

## 2024-05-14 NOTE — Progress Notes (Signed)
 Daily Progress Note   Patient Name: Joseph [name]       Date: 05/14/2024 DOB: 04-Mar-1981  Age: 43 y.o. MRN#: [id number] Attending Physician: Verdene Purchase, MD Primary Care Physician: Patient, No Pcp Per Admit Date: 04/27/2024  Reason for Consultation/Follow-up: Pain control  Length of Stay: 12  Current Medications: Scheduled Meds:   acetaminophen   1,000 mg Oral Q8H   apixaban   10 mg Oral BID   Followed by   NOREEN ON 05/21/2024] apixaban   5 mg Oral BID   Chlorhexidine  Gluconate Cloth  6 each Topical Daily   cyclobenzaprine   10 mg Oral TID   methocarbamol   500 mg Oral TID   multivitamin with minerals  1 tablet Oral Daily   oxyCODONE   90 mg Oral Q8H   polyethylene glycol  17 g Oral BID   Ensure Max Protein  11 oz Oral BID   senna-docusate  2 tablet Oral BID   sodium chloride  flush  10-40 mL Intracatheter Q12H   sodium chloride  flush  3 mL Intravenous Q12H    Continuous Infusions:   PRN Meds: bisacodyl , HYDROmorphone  (DILAUDID ) injection, ondansetron  **OR** ondansetron  (ZOFRAN ) IV, oxyCODONE , sodium chloride  flush, sodium phosphate   Physical Exam Vitals reviewed.  Constitutional:      General: He is sleeping.  Neurological:     Mental Status: He is easily aroused.             Vital Signs: BP (!) 146/63 (BP Location: Right Leg)   Pulse (!) 107   Temp 98.7 F (37.1 C) (Oral)   Resp 15   Ht 5' 9 (1.753 m)   Wt (!) 143.5 kg   SpO2 93%   BMI 46.72 kg/m  SpO2: SpO2: 93 % O2 Device: O2 Device: Room Air O2 Flow Rate: O2 Flow Rate (L/min): 0 L/min    Patient Active Problem List   Diagnosis Date Noted   Papillary thyroid  carcinoma (HCC) 05/10/2024   Malignant neoplasm metastatic to both lungs (HCC) 05/10/2024   Metastatic cancer to spine (HCC) 05/06/2024    Papillary carcinoma (HCC) 05/06/2024   Thoracic spine fracture (HCC) 05/02/2024   Ataxia 05/01/2024   Right shoulder pain 05/01/2024   Pulmonary HTN (HCC) 04/29/2024   Low back pain 04/29/2024   Pulmonary nodules 04/29/2024   Thyroid  nodule 04/29/2024   Acute pulmonary embolism (  HCC) 04/28/2024   Abscess 03/31/2018   Thrombocytopenia 03/31/2018   Depression 03/31/2018   Abscess of hand 03/31/2018   Abscess of right hand 03/31/2018   GAD (generalized anxiety disorder) 12/04/2014   Alcohol use disorder, severe, dependence (HCC) 12/03/2014   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 male  with past medical history of no significant medical history presented to the hospital with about 4 days of back pain and difficulty walking, he was having some back pain issues for about 2 weeks.  Workup revealed pathological T2 compression fracture and large left thyroid  nodule for which she was transferred to [facility] from [facility] for neurosurgery.  He was admitted on 04/27/2024 with metastatic papillary thyroid  cancer, T2 pathological fracture with compression myelopathy, C6 lucent lesion suspected metastatic disease, left thyroid  enlargement, pulmonary nodules suspected metastatic disease, right upper lobe segmental PE, pulmonary artery hypertension, acute blood loss anemia, thrombocytopenia, chronic right shoulder pain, constipation, and others.    Palliative medicine was consulted for assistance with cancer associated pain management.   Today's Discussion: Reviewed chart. Patient received first dose of Oxycontin  90 mg every 8 hours at 2200 yesterday 11/21. In the last 24 hours he required  three doses of IV dilaudid  1 mg and two doses of oral oxycodone  20 mg. His last bowel movement was today.   Patient is awake watching television. We reviewed the changes made to his pain regimen yesterday and his pain medication requirements over the last 24 hours. Patient reports his overall pain is mildly improved with the medication changes and he has been able to sleep some. He would like to see further improvement in his pain- especially in his right shoulder. We discussed that we will not be able to get his pain to a zero-- He understands this. We discussed the importance of a good bowel regimen with the increased opioid use. Patient appeared sleepy and began dozing off. We agreed to keep his pain regimen the same and re-evaluate tomorrow.  Encouraged the patient to reach out to PMT with needs. PMT will continue to monitor.  Recommendations/Plan: Full code Full scope of care Use oxycodone  IR first for moderate to severe pain IV Dilaudid  as a rescue medication for ineffective oxycodone  IR See full symptom management below Referral for outpatient palliative care in the cancer center Palliative medicine will follow-up tomorrow for ongoing adjustment  Symptom Management: Tylenol  1000 mg p.o. every 8 hours Flexeril  10 mg p.o. 3 times daily Robaxin  500 mg p.o. 3 times daily OxyContin  90 mg p.o. every 8 hours Oxycodone  IR 20 mg p.o. every 4 hours as needed moderate to severe pain Dilaudid  1 mg IV every 3 hours as needed breakthrough severe pain not relieved by oxycodone  IR Dulcolax EC 5 mg p.o. daily as needed moderate constipation MiraLAX  17 g p.o. twice daily Senokot S 2 tablets p.o. twice daily   Code Status:    Code Status Orders  (From admission, onward)           Start     Ordered   04/28/24 2016  Full code  Continuous       Question:  By:  Answer:  Consent: discussion documented in EHR   04/28/24 2016  Extensive chart review has been completed prior to  seeing the patient including labs, vital signs, imaging, progress/consult notes, orders, medications, and available advance directive documents.  Care plan was discussed with Dr. Verdene  Time spent: 50 minutes    Thank you for allowing the Palliative Medicine Team to assist in the care of this patient.  SABRA Stephane CHRISTELLA Dannette, NP  Please contact Palliative Medicine Team phone at [phone] for questions and concerns.

## 2024-05-14 NOTE — Progress Notes (Signed)
 PHARMACY - ANTICOAGULATION CONSULT NOTE  Pharmacy Consult for heparin  > Apixaban  Indication: pulmonary embolus  No Known Allergies  Patient Measurements: Height: 5' 9 (175.3 cm) Weight: (!) 143.5 kg (316 lb 5.8 oz) IBW/kg (Calculated) : 70.7 HEPARIN  DW (KG): 104.9  Vital Signs:    Labs: Recent Labs    05/11/24 1324 05/12/24 0609 05/12/24 1840 05/12/24 1930 05/13/24 0500 05/13/24 1330 05/14/24 0745  HGB 9.8* 10.0*  --   --  10.1*  --  9.8*  HCT 29.8* 31.4*  --   --  31.3*  --  30.8*  PLT PLATELET CLUMPS NOTED ON SMEAR, UNABLE TO ESTIMATE PLATELET CLUMPS NOTED ON SMEAR, UNABLE TO ESTIMATE  --   --  PLATELET CLUMPS NOTED ON SMEAR, UNABLE TO ESTIMATE  --  PLATELET CLUMPS NOTED ON SMEAR, UNABLE TO ESTIMATE  APTT 34 35  --   --  69*  --  67*  LABPROT 15.8*  --   --   --   --   --   --   INR 1.2  --   --   --   --   --   --   HEPARINUNFRC  --   --    < > 0.28* 0.25* 0.39  --   CREATININE 0.74 0.84  --   --   --   --  0.68   < > = values in this interval not displayed.    Estimated Creatinine Clearance: 168.1 mL/min (by C-G formula based on SCr of 0.68 mg/dL).   Medical History: Past Medical History:  Diagnosis Date   Anxiety    Asthma    as a child   Depression    Narcotic abuse (HCC)    Pulmonary nodules 04/29/2024    Assessment: Patient admitted with CC of back pain. Found to have acute PE on 11/6. Started on Eliquis  which was stopped on 11/9. Found to have T2 fracture with compressive myelopathy and C6 lesion. Patient underwent a T1-T3 instrumentation fusion T2 for resection of tumor on 11/13 with pathology showing thyroid  papillary carcinoma. Tumor was noted to be hemorrhagic intraoperatively, NS recommending heparin  w/o bolus.   Pharmacy consulted to transition IV Heparin  to Apixaban  today.  Hgb 9.8 - stable. Pltatelets remain clumpled. No bleeding reported. aPTT was at goal.   Goal of Therapy:  Monitor platelets by anticoagulation protocol: Yes   Plan:   Discontinue Heparin . Start Apixaban  10mg  PO BID x7 days then 5 mg PO BID.  Monitor closely for s/sx of bleeding,  Eliquis   co-pay $4.00.   Harlene Boga, PharmD, BCPS, BCCCP Please refer to [facility] for [facility] numbers 05/14/2024,9:19 AM

## 2024-05-15 DIAGNOSIS — I2699 Other pulmonary embolism without acute cor pulmonale: Secondary | ICD-10-CM | POA: Diagnosis not present

## 2024-05-15 DIAGNOSIS — C73 Malignant neoplasm of thyroid gland: Secondary | ICD-10-CM | POA: Diagnosis not present

## 2024-05-15 LAB — CBC
HCT: 30.8 % — ABNORMAL LOW (ref 39.0–52.0)
Hemoglobin: 9.8 g/dL — ABNORMAL LOW (ref 13.0–17.0)
MCH: 29.5 pg (ref 26.0–34.0)
MCHC: 31.8 g/dL (ref 30.0–36.0)
MCV: 92.8 fL (ref 80.0–100.0)
Platelets: UNDETERMINED K/uL (ref 150–400)
RBC: 3.32 MIL/uL — ABNORMAL LOW (ref 4.22–5.81)
RDW: 13.9 % (ref 11.5–15.5)
WBC: 11.5 K/uL — ABNORMAL HIGH (ref 4.0–10.5)
nRBC: 0 % (ref 0.0–0.2)

## 2024-05-15 NOTE — Progress Notes (Signed)
 Daily Progress Note   Patient Name: Joseph [name]       Date: 05/15/2024 DOB: 1980-08-27  Age: 43 y.o. MRN#: [id number] Attending Physician: Verdene Purchase, MD Primary Care Physician: Patient, No Pcp Per Admit Date: 04/27/2024  Reason for Consultation/Follow-up: Pain control  Length of Stay: 13  Current Medications: Scheduled Meds:   acetaminophen   1,000 mg Oral Q8H   apixaban   10 mg Oral BID   Followed by   NOREEN ON 05/21/2024] apixaban   5 mg Oral BID   Chlorhexidine  Gluconate Cloth  6 each Topical Daily   cyclobenzaprine   10 mg Oral TID   methocarbamol   500 mg Oral TID   multivitamin with minerals  1 tablet Oral Daily   oxyCODONE   90 mg Oral Q8H   polyethylene glycol  17 g Oral BID   Ensure Max Protein  11 oz Oral BID   senna-docusate  2 tablet Oral BID   sodium chloride  flush  10-40 mL Intracatheter Q12H   sodium chloride  flush  3 mL Intravenous Q12H    Continuous Infusions:   PRN Meds: bisacodyl , HYDROmorphone  (DILAUDID ) injection, ondansetron  **OR** ondansetron  (ZOFRAN ) IV, oxyCODONE , sodium chloride  flush, sodium phosphate   Physical Exam Vitals reviewed.  Constitutional:      General: He is not in acute distress. Cardiovascular:     Rate and Rhythm: Normal rate.  Pulmonary:     Effort: Pulmonary effort is normal.  Skin:    General: Skin is warm and dry.  Neurological:     Mental Status: He is alert, oriented to person, place, and time and easily aroused.  Psychiatric:        Behavior: Behavior normal.             Vital Signs: BP 137/76 (BP Location: Right Leg)   Pulse 93   Temp 98.7 F (37.1 C) (Oral)   Resp 11   Ht 5' 9 (1.753 m)   Wt (!) 143.5 kg   SpO2 94%   BMI 46.72 kg/m  SpO2: SpO2: 94 % O2 Device: O2 Device: Room Air O2 Flow Rate: O2  Flow Rate (L/min): 0 L/min    Patient Active Problem List   Diagnosis Date Noted   Papillary thyroid  carcinoma (HCC) 05/10/2024   Malignant neoplasm metastatic to both lungs (HCC) 05/10/2024   Metastatic cancer to  spine (HCC) 05/06/2024   Papillary carcinoma (HCC) 05/06/2024   Thoracic spine fracture (HCC) 05/02/2024   Ataxia 05/01/2024   Right shoulder pain 05/01/2024   Pulmonary HTN (HCC) 04/29/2024   Low back pain 04/29/2024   Pulmonary nodules 04/29/2024   Thyroid  nodule 04/29/2024   Acute pulmonary embolism (HCC) 04/28/2024   Abscess 03/31/2018   Thrombocytopenia 03/31/2018   Depression 03/31/2018   Abscess of hand 03/31/2018   Abscess of right hand 03/31/2018   GAD (generalized anxiety disorder) 12/04/2014   Alcohol use disorder, severe, dependence (HCC) 12/03/2014   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 male  with past medical history of no significant medical history presented to the hospital with about 4 days of back pain and difficulty walking, he was having some back pain issues for about 2 weeks.  Workup revealed pathological T2 compression fracture and large left thyroid  nodule for which she was transferred to [facility] from [facility] for neurosurgery.  He was admitted on 04/27/2024 with metastatic papillary thyroid  cancer, T2 pathological fracture with compression myelopathy, C6 lucent lesion suspected metastatic disease, left thyroid  enlargement, pulmonary nodules suspected metastatic disease, right upper lobe segmental PE, pulmonary artery hypertension, acute blood loss anemia, thrombocytopenia, chronic right shoulder pain, constipation, and others.     Palliative medicine was consulted for assistance with cancer associated pain management.   Today's Discussion: Reviewed chart. Patient continues with Oxycontin  90 mg three times a day. In the last 24 hours he required three doses of IV dilaudid  1 mg and one dose of oral oxycodone  20 mg. His last bowel movement was yesterday.    Patient was asleep. He easily awakens when I called his name. We reviewed his pain medication requirements over the last 24 hours. Patient reports satisfaction with current pain regimen we will keep it the same and re-evaluate tomorrow.   Encouraged the patient to reach out to PMT with needs. PMT will continue to monitor.  Recommendations/Plan: Full code Full scope of care See full symptom management below Referral for outpatient palliative care in the cancer center Palliative medicine will follow-up tomorrow for ongoing adjustment  Symptom Management: Tylenol  1000 mg p.o. every 8 hours Flexeril  10 mg p.o. 3 times daily Robaxin  500 mg p.o. 3 times daily OxyContin  90 mg p.o. every 8 hours Oxycodone  IR 20 mg p.o. every 4 hours as needed moderate to severe pain Dilaudid  1 mg IV every 3 hours as needed breakthrough severe pain not relieved by oxycodone  IR Dulcolax EC 5 mg p.o. daily as needed moderate constipation MiraLAX  17 g p.o. twice daily Senokot S 2 tablets p.o. twice daily   Code Status:    Code Status Orders  (From admission, onward)           Start     Ordered   04/28/24 2016  Full code  Continuous       Question:  By:  Answer:  Consent: discussion documented in EHR   04/28/24 2016         Extensive chart review has been completed prior to seeing the patient including labs, vital signs, imaging, progress/consult notes, orders, medications, and available advance directive documents.  Care plan was discussed with Dr. Verdene  Time  spent: 25 minutes    Thank you for allowing the Palliative Medicine Team to assist in the care of this  patient.  SABRA Stephane CHRISTELLA Dannette, NP  Please contact Palliative Medicine Team phone at [phone] for questions and concerns.

## 2024-05-15 NOTE — Progress Notes (Signed)
 PROGRESS NOTE   Joseph [name]  FMW:[id number]    DOB: 1980-09-19    DOA: 04/27/2024  PCP: Patient, No Pcp Per    Brief Hospital Course:  43 year old gentleman with no significant medical history presented to the hospital with about 4 days of back pain and difficulty walking, he was having some back pain issues for about 2 weeks.  CT scan of the lumbar spine was unrevealing.  CTA chest PE protocol showed right upper lobe segmental PE. He was started on Eliquis . Patient continued to have difficulty with mobility.  Multiple investigations were done.  MRI of the thoracic spine ultimately showed pathological T2 compression fracture. Thyroid  ultrasound showed large left thyroid  nodule.  He was transferred to [facility] from [place] for Neurosurgery.  11/12, IR performed US  guided left lobe thyroid  nodule biopsy.  11/13: S/p posterior T1-3 instrumentation fusion T2 corpectomy for resection of tumor.  Thyroid  biopsy showed papillary carcinoma, oncology consulted and seen on 11/14 and plan to follow-up this week.   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  11/13: S/p posterior T1-3 instrumentation fusion T2 corpectomy for resection of tumor.  Extensive procedure.  Hemorrhagic tumor.  1500 mL EBL.  Got 2 units of PRBC across surgery.   JP drain removed.  Wound VAC was removed.  Prevena suction machine was applied on 11/20.  This is to stay in place until 11/27.   Thyroid  biopsy pathology confirms papillary carcinoma.   Patient seen by medical oncology.  They have placed referral to ENT and endocrinology.  Radiation oncology referral was placed by neurosurgery.  All of this will be accomplished in the outpatient  setting. Seen by therapy and they recommend inpatient rehabilitation. Dexamethasone  has been weaned off. Outpatient follow-up with neurosurgery. Palliative care is following for pain management.  Patient noted to be on scheduled Tylenol , OxyContin  90 mg every 8 hours.  Also on as needed IV hydromorphone  and oxycodone .  Continue with bowel regimen.  Right upper lobe segmental PE Pulmonary artery hypertension Anticoagulation was on hold due to surgery.   Subsequently cleared by neurosurgery to initiate anticoagulation.  Patient was started on IV heparin  which he tolerated well.  Patient was transitioned to apixaban  on 11/22.  Somewhat of a challenging situation since platelet counts have not been available on CBC.  Clumps have been noted. No bleeding has been noted.  Hemoglobin is stable.  Continue apixaban .  Fever/leukocytosis Low-grade fever was thought to be multifactorial.  Could be due to his pulmonary embolism.  Steroids also contributing to leukocytosis.  Steroids have been discontinued. UA was unremarkable.  Chest x-ray did not show any pneumonia. BBC is now trending downwards.    Hypomagnesemia Supplemented.  Recheck labs in the morning.  Acute blood loss anemia As noted above, EBL 1500 mL during neurosurgical procedure 11/13 and got 2 units PRBC.  Hemoglobin stable for the last several days.  Thrombocytopenia Platelet clumping on daily serial CBCs and counts not known.  Right shoulder pain, chronic Pain predates his current surgery.  Denies any worsening since hospital admission.  Communicated with Orthopedics PA 11/17 and recommends outpatient follow-up for suspected rotator cuff pathology.  PT evaluation.  Constipation Continue bowel regimen.  Patient did have a bowel movement on 11/18.    Body mass index is 46.72 kg/m./Very  morbid obesity class III Complicates care.  Outpatient follow-up.   DVT prophylaxis: Subcutaneous heparin    Code Status: Full Code:  Family  Communication: None at bedside.   Disposition: Medically stable to go to inpatient rehabilitation.  Anticipate discharge tomorrow.   Consultants:   Neurosurgery Neurology Interventional radiology Medical oncology  Procedures:   As above Difficult LUE PICC line placement by PICC team on 11/15.  Subjective:  Feels well.  No complaints offered.  Pain is well-controlled.  Objective:   Vitals:   05/14/24 1939 05/14/24 2320 05/15/24 0300 05/15/24 0804  BP: 132/74 132/62 131/67 137/76  Pulse: (!) 102 (!) 102 (!) 111 93  Resp: 18 20  11   Temp: 98.2 F (36.8 C) 97.7 F (36.5 C) 99.1 F (37.3 C) 98.7 F (37.1 C)  TempSrc: Oral Oral Oral Oral  SpO2: 96% 96% 94% 94%  Weight:      Height:        General appearance: Awake alert.  In no distress Resp: Clear to auscultation bilaterally.  Normal effort Cardio: S1-S2 is normal regular.  No S3-S4.  No rubs murmurs or bruit GI: Abdomen is soft.  Nontender nondistended.  Bowel sounds are present normal.  No masses organomegaly    Data Reviewed:     CBC: Recent Labs  Lab 05/09/24 0550 05/09/24 0551 05/13/24 0500 05/14/24 0745 05/15/24 0535  WBC 19.9*   < > 20.0* 14.9* 11.5*  NEUTROABS 13.9*  --   --   --   --   HGB 11.1*   < > 10.1* 9.8* 9.8*  HCT 33.3*   < > 31.3* 30.8* 30.8*  MCV 90.5   < > 93.4 93.3 92.8  PLT PLATELET CLUMPS NOTED ON SMEAR, UNABLE TO ESTIMATE   < > PLATELET CLUMPS NOTED ON SMEAR, UNABLE TO ESTIMATE PLATELET CLUMPS NOTED ON SMEAR, UNABLE TO ESTIMATE PLATELET CLUMPS NOTED ON SMEAR, UNABLE TO ESTIMATE   < > = values in this interval not displayed.    Basic Metabolic Panel: Recent Labs  Lab 05/11/24 1324 05/12/24 0609 05/14/24 0745  NA 133* 134* 134*  K 4.0 3.8 3.6  CL 97* 100 100  CO2 23 24 24   GLUCOSE 158* 132* 121*  BUN 15 14 14   CREATININE 0.74 0.84 0.68  CALCIUM 8.1* 8.3* 7.8*  MG  --   --  1.5*  PHOS  --   --  3.7      Microbiology Studies:   No results found for this or any previous  visit (from the past 240 hours).   Radiology Studies:  No results found.    Scheduled Meds:    acetaminophen   1,000 mg Oral Q8H   apixaban   10 mg Oral BID   Followed by   NOREEN ON 05/21/2024] apixaban   5 mg Oral BID   Chlorhexidine  Gluconate Cloth  6 each Topical Daily   cyclobenzaprine   10 mg Oral TID   methocarbamol   500 mg Oral TID   multivitamin with minerals  1 tablet Oral Daily   oxyCODONE   90 mg Oral Q8H   polyethylene glycol  17 g Oral BID   Ensure Max Protein  11 oz Oral BID   senna-docusate  2 tablet Oral BID   sodium chloride  flush  10-40 mL Intracatheter Q12H   sodium chloride  flush  3 mL Intravenous Q12H    Continuous Infusions:      LOS: 13 days   Joette Pebbles, MD,    To contact the attending provider between 7A-7P  or the covering provider during after hours 7P-7A, please log into the web site [email or web address] and access using universal [place] password for that web site. If you do not have the password, please call the hospital operator.  05/15/2024, 10:05 AM

## 2024-05-15 NOTE — Plan of Care (Signed)

## 2024-05-16 ENCOUNTER — Encounter (HOSPITAL_COMMUNITY): Payer: Self-pay | Admitting: Physical Medicine and Rehabilitation

## 2024-05-16 ENCOUNTER — Telehealth (HOSPITAL_COMMUNITY): Payer: Self-pay

## 2024-05-16 ENCOUNTER — Inpatient Hospital Stay (HOSPITAL_COMMUNITY)
Admission: AD | Admit: 2024-05-16 | Discharge: 2024-05-20 | DRG: 560 | Disposition: A | Discharge status: Other Acute Inpatient Hospital | Payer: MEDICAID | Source: Intra-hospital | Attending: Physical Medicine and Rehabilitation | Admitting: Physical Medicine and Rehabilitation

## 2024-05-16 ENCOUNTER — Other Ambulatory Visit (HOSPITAL_COMMUNITY): Payer: Self-pay

## 2024-05-16 ENCOUNTER — Other Ambulatory Visit: Payer: Self-pay

## 2024-05-16 DIAGNOSIS — M25511 Pain in right shoulder: Secondary | ICD-10-CM | POA: Diagnosis present

## 2024-05-16 DIAGNOSIS — M62838 Other muscle spasm: Secondary | ICD-10-CM | POA: Diagnosis present

## 2024-05-16 DIAGNOSIS — Z5901 Sheltered homelessness: Secondary | ICD-10-CM

## 2024-05-16 DIAGNOSIS — G8222 Paraplegia, incomplete: Principal | ICD-10-CM | POA: Diagnosis present

## 2024-05-16 DIAGNOSIS — Z7901 Long term (current) use of anticoagulants: Secondary | ICD-10-CM

## 2024-05-16 DIAGNOSIS — G992 Myelopathy in diseases classified elsewhere: Secondary | ICD-10-CM | POA: Diagnosis present

## 2024-05-16 DIAGNOSIS — I272 Pulmonary hypertension, unspecified: Secondary | ICD-10-CM | POA: Diagnosis present

## 2024-05-16 DIAGNOSIS — F32A Depression, unspecified: Secondary | ICD-10-CM | POA: Diagnosis present

## 2024-05-16 DIAGNOSIS — C7801 Secondary malignant neoplasm of right lung: Secondary | ICD-10-CM | POA: Diagnosis present

## 2024-05-16 DIAGNOSIS — M84521A Pathological fracture in neoplastic disease, right humerus, initial encounter for fracture: Secondary | ICD-10-CM | POA: Diagnosis not present

## 2024-05-16 DIAGNOSIS — M8448XK Pathological fracture, other site, subsequent encounter for fracture with nonunion: Secondary | ICD-10-CM | POA: Diagnosis not present

## 2024-05-16 DIAGNOSIS — F17210 Nicotine dependence, cigarettes, uncomplicated: Secondary | ICD-10-CM | POA: Diagnosis present

## 2024-05-16 DIAGNOSIS — C7802 Secondary malignant neoplasm of left lung: Secondary | ICD-10-CM | POA: Diagnosis present

## 2024-05-16 DIAGNOSIS — M4714 Other spondylosis with myelopathy, thoracic region: Secondary | ICD-10-CM | POA: Diagnosis not present

## 2024-05-16 DIAGNOSIS — F1420 Cocaine dependence, uncomplicated: Secondary | ICD-10-CM | POA: Diagnosis present

## 2024-05-16 DIAGNOSIS — K5901 Slow transit constipation: Secondary | ICD-10-CM

## 2024-05-16 DIAGNOSIS — M84421S Pathological fracture, right humerus, sequela: Secondary | ICD-10-CM | POA: Diagnosis present

## 2024-05-16 DIAGNOSIS — Z4789 Encounter for other orthopedic aftercare: Secondary | ICD-10-CM | POA: Diagnosis not present

## 2024-05-16 DIAGNOSIS — M84421A Pathological fracture, right humerus, initial encounter for fracture: Secondary | ICD-10-CM | POA: Insufficient documentation

## 2024-05-16 DIAGNOSIS — N319 Neuromuscular dysfunction of bladder, unspecified: Secondary | ICD-10-CM | POA: Diagnosis present

## 2024-05-16 DIAGNOSIS — Z981 Arthrodesis status: Secondary | ICD-10-CM

## 2024-05-16 DIAGNOSIS — E041 Nontoxic single thyroid nodule: Secondary | ICD-10-CM | POA: Diagnosis present

## 2024-05-16 DIAGNOSIS — K5900 Constipation, unspecified: Secondary | ICD-10-CM | POA: Diagnosis present

## 2024-05-16 DIAGNOSIS — M899 Disorder of bone, unspecified: Secondary | ICD-10-CM | POA: Diagnosis present

## 2024-05-16 DIAGNOSIS — C801 Malignant (primary) neoplasm, unspecified: Secondary | ICD-10-CM | POA: Diagnosis present

## 2024-05-16 DIAGNOSIS — C799 Secondary malignant neoplasm of unspecified site: Secondary | ICD-10-CM

## 2024-05-16 DIAGNOSIS — F1220 Cannabis dependence, uncomplicated: Secondary | ICD-10-CM | POA: Diagnosis present

## 2024-05-16 DIAGNOSIS — C7951 Secondary malignant neoplasm of bone: Secondary | ICD-10-CM | POA: Diagnosis present

## 2024-05-16 DIAGNOSIS — C73 Malignant neoplasm of thyroid gland: Secondary | ICD-10-CM | POA: Diagnosis present

## 2024-05-16 DIAGNOSIS — I2699 Other pulmonary embolism without acute cor pulmonale: Secondary | ICD-10-CM | POA: Diagnosis present

## 2024-05-16 DIAGNOSIS — S46001D Unspecified injury of muscle(s) and tendon(s) of the rotator cuff of right shoulder, subsequent encounter: Secondary | ICD-10-CM | POA: Diagnosis not present

## 2024-05-16 DIAGNOSIS — R270 Ataxia, unspecified: Secondary | ICD-10-CM

## 2024-05-16 DIAGNOSIS — R159 Full incontinence of feces: Secondary | ICD-10-CM | POA: Diagnosis present

## 2024-05-16 DIAGNOSIS — F411 Generalized anxiety disorder: Secondary | ICD-10-CM | POA: Diagnosis present

## 2024-05-16 DIAGNOSIS — M545 Low back pain, unspecified: Secondary | ICD-10-CM | POA: Diagnosis present

## 2024-05-16 DIAGNOSIS — M8448XA Pathological fracture, other site, initial encounter for fracture: Principal | ICD-10-CM | POA: Diagnosis present

## 2024-05-16 DIAGNOSIS — M8448XS Pathological fracture, other site, sequela: Secondary | ICD-10-CM | POA: Diagnosis present

## 2024-05-16 LAB — CBC
HCT: 31.5 % — ABNORMAL LOW (ref 39.0–52.0)
Hemoglobin: 9.9 g/dL — ABNORMAL LOW (ref 13.0–17.0)
MCH: 29.6 pg (ref 26.0–34.0)
MCHC: 31.4 g/dL (ref 30.0–36.0)
MCV: 94 fL (ref 80.0–100.0)
Platelets: 122 K/uL — ABNORMAL LOW (ref 150–400)
RBC: 3.35 MIL/uL — ABNORMAL LOW (ref 4.22–5.81)
RDW: 13.8 % (ref 11.5–15.5)
WBC: 10.8 K/uL — ABNORMAL HIGH (ref 4.0–10.5)
nRBC: 0 % (ref 0.0–0.2)

## 2024-05-16 LAB — COMPREHENSIVE METABOLIC PANEL WITH GFR
ALT: 33 U/L (ref 0–44)
AST: 18 U/L (ref 15–41)
Albumin: 2.4 g/dL — ABNORMAL LOW (ref 3.5–5.0)
Alkaline Phosphatase: 69 U/L (ref 38–126)
Anion gap: 8 (ref 5–15)
BUN: 18 mg/dL (ref 6–20)
CO2: 27 mmol/L (ref 22–32)
Calcium: 8.5 mg/dL — ABNORMAL LOW (ref 8.9–10.3)
Chloride: 98 mmol/L (ref 98–111)
Creatinine, Ser: 0.61 mg/dL (ref 0.61–1.24)
GFR, Estimated: >60 mL/min (ref >60)
Glucose, Bld: 91 mg/dL (ref 70–99)
Potassium: 4.3 mmol/L (ref 3.5–5.1)
Sodium: 133 mmol/L — ABNORMAL LOW (ref 135–145)
Total Bilirubin: 0.4 mg/dL (ref 0.0–1.2)
Total Protein: 6.4 g/dL — ABNORMAL LOW (ref 6.5–8.1)

## 2024-05-16 LAB — MAGNESIUM: Magnesium: 1.7 mg/dL (ref 1.7–2.4)

## 2024-05-16 LAB — PHOSPHORUS: Phosphorus: 4.1 mg/dL (ref 2.5–4.6)

## 2024-05-16 MED ORDER — LIDOCAINE-PRILOCAINE 2.5-2.5 % EX CREA: TOPICAL_CREAM | CUTANEOUS | Status: DC | PRN

## 2024-05-16 MED ORDER — POLYETHYLENE GLYCOL 3350 17 G PO PACK
17.0000 g | PACK | Freq: Two times a day (BID) | ORAL | Status: DC
  Administered 2024-05-17 – 2024-05-20 (×6): 17 g via ORAL
  Filled 2024-05-16 (×9): qty 1

## 2024-05-16 MED ORDER — APIXABAN 5 MG PO TABS: 5.0000 mg | ORAL_TABLET | Freq: Two times a day (BID) | ORAL | Status: DC

## 2024-05-16 MED ORDER — APIXABAN 5 MG PO TABS
10.0000 mg | ORAL_TABLET | Freq: Two times a day (BID) | ORAL | Status: AC
  Administered 2024-05-16 – 2024-05-20 (×9): 10 mg via ORAL
  Filled 2024-05-16 (×9): qty 2

## 2024-05-16 MED ORDER — FLEET ENEMA RE ENEM: 1.0000 | ENEMA | Freq: Every day | RECTAL | Status: DC | PRN

## 2024-05-16 MED ORDER — OXYCODONE HCL ER 15 MG PO T12A
90.0000 mg | EXTENDED_RELEASE_TABLET | Freq: Three times a day (TID) | ORAL | Status: DC
  Administered 2024-05-16 – 2024-05-17 (×2): 90 mg via ORAL
  Filled 2024-05-16 (×2): qty 6

## 2024-05-16 MED ORDER — SORBITOL 70 % SOLN: 60.0000 mL | Freq: Every day | Status: DC | PRN

## 2024-05-16 MED ORDER — MAGNESIUM SULFATE 4 GM/100ML IV SOLN
4.0000 g | Freq: Once | INTRAVENOUS | Status: AC
  Administered 2024-05-16: 4 g via INTRAVENOUS
  Filled 2024-05-16: qty 100

## 2024-05-16 MED ORDER — CYCLOBENZAPRINE HCL 10 MG PO TABS
10.0000 mg | ORAL_TABLET | Freq: Three times a day (TID) | ORAL | Status: DC
  Administered 2024-05-16 – 2024-05-20 (×12): 10 mg via ORAL
  Filled 2024-05-16 (×13): qty 1

## 2024-05-16 MED ORDER — BISACODYL 5 MG PO TBEC: 5.0000 mg | DELAYED_RELEASE_TABLET | Freq: Every day | ORAL | Status: DC | PRN

## 2024-05-16 MED ORDER — ACETAMINOPHEN 325 MG PO TABS
325.0000 mg | ORAL_TABLET | ORAL | Status: DC | PRN
  Administered 2024-05-17 – 2024-05-19 (×3): 650 mg via ORAL
  Filled 2024-05-16 (×3): qty 2

## 2024-05-16 MED ORDER — SENNOSIDES-DOCUSATE SODIUM 8.6-50 MG PO TABS
2.0000 | ORAL_TABLET | Freq: Two times a day (BID) | ORAL | Status: DC
  Administered 2024-05-17 – 2024-05-20 (×7): 2 via ORAL
  Filled 2024-05-16 (×9): qty 2

## 2024-05-16 MED ORDER — ADULT MULTIVITAMIN W/MINERALS CH
1.0000 | ORAL_TABLET | Freq: Every day | ORAL | Status: DC
  Administered 2024-05-17 – 2024-05-20 (×4): 1 via ORAL
  Filled 2024-05-16 (×4): qty 1

## 2024-05-16 MED ORDER — METHOCARBAMOL 500 MG PO TABS
500.0000 mg | ORAL_TABLET | Freq: Three times a day (TID) | ORAL | Status: DC
  Administered 2024-05-16 – 2024-05-17 (×2): 500 mg via ORAL
  Filled 2024-05-16 (×2): qty 1

## 2024-05-16 MED ORDER — ONDANSETRON HCL 4 MG PO TABS
4.0000 mg | ORAL_TABLET | Freq: Four times a day (QID) | ORAL | Status: DC | PRN
  Filled 2024-05-16: qty 1

## 2024-05-16 MED ORDER — ENSURE MAX PROTEIN PO LIQD
11.0000 [oz_av] | Freq: Two times a day (BID) | ORAL | Status: DC
  Administered 2024-05-16 – 2024-05-20 (×9): 11 [oz_av] via ORAL

## 2024-05-16 MED ORDER — OXYCODONE HCL 5 MG PO TABS
20.0000 mg | ORAL_TABLET | ORAL | Status: DC | PRN
  Administered 2024-05-16 – 2024-05-20 (×15): 20 mg via ORAL
  Filled 2024-05-16 (×15): qty 4

## 2024-05-16 MED ORDER — ONDANSETRON HCL 4 MG/2ML IJ SOLN: 4.0000 mg | Freq: Four times a day (QID) | INTRAMUSCULAR | Status: DC | PRN

## 2024-05-16 NOTE — PMR Pre-admission (Signed)
 PMR Admission Coordinator Pre-Admission Assessment  Patient: Joseph [name] is an 43 y.o., male MRN: [id number] DOB: 08/08/1980 Height: 5' 9 (175.3 cm) Weight: (!) 143.5 kg  Insurance Information HMO:     PPO:      PCP:      IPA:      80/20:      OTHER:  PRIMARY: [employer]      Policy#: [id number]      Subscriber: pt CM Name: faxed approval from Lisa Koziol    Phone#: [phone]     Fax#: [phone] Pre-Cert#: [id number] auth for CIR from [place] with [employer] for admit 05/13/24 with next review date 05/20/24.  Updates due to [place] at fax listed above.        Employer: n/a Benefits:  Phone #: [phone]     Name:  Eff. Date: 02/22/23     Deduct: $0      Out of Pocket Max: $0      Life Max:  CIR: 100%      SNF: 100% Outpatient:      Co-Pay: $4/visit Home Health:       Co-Pay: $4/visit DME: 100%     Co-Pay:  Providers:  SECONDARY:       Policy#:      Phone#:   Artist:       Phone#:   The "Data Collection Information Summary" for patients in Inpatient Rehabilitation Facilities with attached "Privacy Act Statement-Health Care Records" was provided and verbally reviewed with: Patient and Family  Emergency Contact Information Contact Information     Name Relation Home Work Mobile   [place] Mother [phone]        Other Contacts     Name Relation Home Work Mobile   [place] Father   [phone]       Current Medical History  Patient Admitting Diagnosis: pathological SCI   History of Present Illness: Pt is a 43 y/o male with PMH of asthma, anxiety, and PSA who presented to [facility] on 11/5 with c/o low back pain, rib pain for several weeks until he was having difficulty walking and saddle anesthesia.  In ED he was hemodynamically stable, and xray showed possible LUL nodule, and degenerative changes in the lumbar spine.  CT of lumbar spine showed large Schmorl's node at the L5 endplate.  CTA chest showed RUL segmental PE with  incidental RLL nodules, enlarge L lobe of the thyroid , and gallstones.  He was started on heparin  gtt and admitted to [facility] on 11/6. He continued to have difficulty with ambulation and LE sensory changes so neurology was consulted and recommended full spine MRI which showed a pathologic fracture at T2 with several spinal canal stenosis and cord compression L>R with subtle thoracic cord signal change suggesting early cord edema.  Xray shoulder showed humeral lucency concerning for malignancy.  He was then transferred to [facility] on 11/9 for neurosurgery consultation.  Recommendations were for operative intervention after eliquis  wash out.  Pt underwent a posterior T1-3 instrumentation and fusion, with T1-3 laminectomy, T2 corpectomy for resection of metastatic tumor per dr. Darnella on 11/13.  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  Oncology was consulted and recommend outpatient follow up with ENT, radiation oncology, and endocrinology.  Palliative consulted for goals and pain management.  He has a prevena vac to his surgical incision which will be d/c'd on 11/27.  He has  been weaned off of dexamethasone .  He is currently on eliquis .  He did develop a fever the week of 11/17 but workup was negative and he has been afebrile with WBC trending down.  His PICC line was d/c'd on 11/24 in anticipation of transition to CIR.  Therapy ongoing and pt was recommended for CIR.     Patient's medical record from [facility] has been reviewed by the rehabilitation admission coordinator and physician.  Past Medical History  Past Medical History:  Diagnosis Date   Anxiety    Asthma    as a child   Depression    Narcotic abuse (HCC)    Pulmonary nodules 04/29/2024    Has the patient had major surgery during 100 days prior to admission? Yes  Family History   family history includes Alcohol abuse in an other  family member; Other in an other family member.  Current Medications  Current Facility-Administered Medications:    acetaminophen  (TYLENOL ) tablet 1,000 mg, 1,000 mg, Oral, Q8H, Garst, Jonathan R, MD, 1,000 mg at 05/16/24 9363   apixaban  (ELIQUIS ) tablet 10 mg, 10 mg, Oral, BID, 10 mg at 05/16/24 0823 **FOLLOWED BY** [START ON 05/21/2024] apixaban  (ELIQUIS ) tablet 5 mg, 5 mg, Oral, BID, Millen, Jessica B, RPH   bisacodyl  (DULCOLAX) EC tablet 5 mg, 5 mg, Oral, Daily PRN, Garst, Jonathan R, MD, 5 mg at 05/12/24 9164   Chlorhexidine  Gluconate Cloth 2 % PADS 6 each, 6 each, Topical, Daily, Darnella Dorn SAUNDERS, MD, 6 each at 05/16/24 0825   cyclobenzaprine  (FLEXERIL ) tablet 10 mg, 10 mg, Oral, TID, Garst, Jonathan R, MD, 10 mg at 05/16/24 9175   HYDROmorphone  (DILAUDID ) injection 1 mg, 1 mg, Intravenous, Q3H PRN, Marvis Camellia LABOR, NP, 1 mg at 05/16/24 9250   magnesium  sulfate IVPB 4 g 100 mL, 4 g, Intravenous, Once, Krishnan, Gokul, MD, Last Rate: 50 mL/hr at 05/16/24 1114, 4 g at 05/16/24 1114   methocarbamol  (ROBAXIN ) tablet 500 mg, 500 mg, Oral, TID, Garst, Jonathan R, MD, 500 mg at 05/16/24 9176   multivitamin with minerals tablet 1 tablet, 1 tablet, Oral, Daily, Hongalgi, Anand D, MD, 1 tablet at 05/16/24 9176   ondansetron  (ZOFRAN ) tablet 4 mg, 4 mg, Oral, Q6H PRN **OR** ondansetron  (ZOFRAN ) injection 4 mg, 4 mg, Intravenous, Q6H PRN, Darnella Dorn SAUNDERS, MD   oxyCODONE  (Oxy IR/ROXICODONE ) immediate release tablet 20 mg, 20 mg, Oral, Q4H PRN, Marvis Camellia A, NP, 20 mg at 05/16/24 1155   oxyCODONE  (OXYCONTIN ) 12 hr tablet 90 mg, 90 mg, Oral, Q8H, Gill, Eric A, NP, 90 mg at 05/16/24 0636   polyethylene glycol (MIRALAX  / GLYCOLAX ) packet 17 g, 17 g, Oral, BID, Hongalgi, Anand D, MD, 17 g at 05/16/24 9175   protein supplement (ENSURE MAX) liquid, 11 oz, Oral, BID, Hongalgi, Anand D, MD, 11 oz at 05/16/24 0825   senna-docusate (Senokot-S) tablet 2 tablet, 2 tablet, Oral, BID, Hongalgi, Anand D, MD, 2 tablet at  05/16/24 9176   sodium chloride  flush (NS) 0.9 % injection 10-40 mL, 10-40 mL, Intracatheter, Q12H, Darnella Dorn SAUNDERS, MD, 20 mL at 05/16/24 0825   sodium chloride  flush (NS) 0.9 % injection 10-40 mL, 10-40 mL, Intracatheter, PRN, Darnella Dorn SAUNDERS, MD   sodium chloride  flush (NS) 0.9 % injection 3 mL, 3 mL, Intravenous, Q12H, Darnella Dorn SAUNDERS, MD, 3 mL at 05/16/24 0825   sodium phosphate  (FLEET) enema 1 enema, 1 enema, Rectal, Daily PRN, Hongalgi, Anand D, MD  Patients Current Diet:  Diet Order  Diet regular Room service appropriate? Yes; Fluid consistency: Thin  Diet effective now                   Precautions / Restrictions Precautions Precautions: Fall, Back Precaution Booklet Issued: No Precaution/Restrictions Comments: painful R shoulder, present prior to admission but pt states worse now Restrictions Weight Bearing Restrictions Per Provider Order: Yes RUE Weight Bearing Per Provider Order: Non weight bearing RLE Weight Bearing Per Provider Order: Non weight bearing LLE Weight Bearing Per Provider Order: Non weight bearing Other Position/Activity Restrictions: Dr. Darnella via secure chat 11/17 - WBAT all extremities. NWB through RUE due to pain.   Has the patient had 2 or more falls or a fall with injury in the past year? Yes  Prior Activity Level Community (5-7x/wk): independent without DME prior to admit, driving  Prior Functional Level Self Care: Did the patient need help bathing, dressing, using the toilet or eating? Independent  Indoor Mobility: Did the patient need assistance with walking from room to room (with or without device)? Independent  Stairs: Did the patient need assistance with internal or external stairs (with or without device)? Independent  Functional Cognition: Did the patient need help planning regular tasks such as shopping or remembering to take medications? Independent  Patient Information Are you of Hispanic, Latino/a,or Spanish  origin?: A. No, not of Hispanic, Latino/a, or Spanish origin What is your race?: B. Black or African American Do you need or want an interpreter to communicate with a doctor or health care staff?: 0. No  Patient's Response To:  Health Literacy and Transportation Is the patient able to respond to health literacy and transportation needs?: Yes Health Literacy - How often do you need to have someone help you when you read instructions, pamphlets, or other written material from your doctor or pharmacy?: Never In the past 12 months, has lack of transportation kept you from medical appointments or from getting medications?: No In the past 12 months, has lack of transportation kept you from meetings, work, or from getting things needed for daily living?: No  Journalist, Newspaper / Equipment Home Equipment: Cane - single point, Information systems manager, BSC/3in1  Prior Device Use: Indicate devices/aids used by the patient prior to current illness, exacerbation or injury? None of the above  Current Functional Level Cognition  Orientation Level: Oriented X4    Extremity Assessment (includes Sensation/Coordination)  Upper Extremity Assessment: RUE deficits/detail RUE Deficits / Details: significant pain, maintained NWB and no ROM due to pain. Left pt out of the sling at the end of the session to encourage elbow, wrist and hand ROM RUE Sensation: WNL RUE Coordination: decreased gross motor  Lower Extremity Assessment: Defer to PT evaluation    ADLs  Overall ADL's : Needs assistance/impaired Eating/Feeding: Independent, Sitting Eating/Feeding Details (indicate cue type and reason): to drink from cup with straw with LUE Grooming: Set up, Sitting Upper Body Bathing: Moderate assistance, Sitting Lower Body Bathing: Maximal assistance, +2 for physical assistance, +2 for safety/equipment, Sit to/from stand Upper Body Dressing : Maximal assistance, Sitting Lower Body Dressing: Total assistance Lower Body  Dressing Details (indicate cue type and reason): unable to get underwear on today despite total A +2 Toilet Transfer: Maximal assistance, +2 for physical assistance, +2 for safety/equipment Toilet Transfer Details (indicate cue type and reason): attempted standing, squat pivoting and lateral scoots to progress towards OOB toileting Toileting- Clothing Manipulation and Hygiene: Total assistance, Sit to/from stand Functional mobility during ADLs: Maximal assistance, +2 for physical  assistance, +2 for safety/equipment General ADL Comments: continues to be limited by significant pain and weakness    Mobility  Overal bed mobility: Needs Assistance Bed Mobility: Supine to Sit Rolling: +2 for safety/equipment, +2 for physical assistance, Min assist Sidelying to sit: Mod assist Supine to sit: Mod assist Sit to sidelying: Mod assist, +2 for physical assistance General bed mobility comments: +1 assist for bed mobility with date, improved tolerance. assist for trunk elevation via LUE HHA, scooting to EOB, hip positioning EOB.    Transfers  Overall transfer level: Needs assistance Equipment used: 2 person hand held assist Transfers: Sit to/from Stand, Bed to chair/wheelchair/BSC Sit to Stand: Max assist, +2 physical assistance, +2 safety/equipment Bed to/from chair/wheelchair/BSC transfer type:: Lateral/scoot transfer Step pivot transfers: Mod assist, +2 physical assistance, +2 safety/equipment  Lateral/Scoot Transfers: Max assist, +2 safety/equipment, +2 physical assistance Transfer via Lift Equipment: Stedy General transfer comment: 3x STS attempts with max A +2, he was able to obtain full upright standing 1x. Straight back chair placed in front of patient to offer LUE support in standing. Unable to offload his feet to progress to a pivot. Lateral scoot from bed to chair with max A +2, pt demonstrated great carryover of technique from last session    Ambulation / Gait / Stairs / Wheelchair  Mobility  Ambulation/Gait Ambulation/Gait assistance: Editor, Commissioning (Feet): 10 Feet Assistive device: Rolling walker (2 wheels) Gait Pattern/deviations: Ataxic, Trunk flexed, Knees buckling General Gait Details: unable this date, given severe pain Gait velocity: decr    Posture / Balance Balance Overall balance assessment: History of Falls, Needs assistance Sitting-balance support: Feet supported Sitting balance-Leahy Scale: Fair Standing balance support: Bilateral upper extremity supported, During functional activity, Reliant on assistive device for balance Standing balance-Leahy Scale: Poor Standing balance comment: tolerated standing for ~10 seconds    Special considerations/life events  Wound Vac yes to upper back, Skin surgical incision to upper back, and Special service needs will need ENT. Radiation, and oncology f/u as an outpatient    Previous Home Environment (from acute therapy documentation) Living Arrangements: Other (Comment) Available Help at Discharge: Family, Available 24 hours/day Type of Home: House Home Layout: One level Home Access: Stairs to enter [employer] of Steps: 1 Bathroom Shower/Tub: Health Visitor: Handicapped height Home Care Services: No Additional Comments: pt currently in second story hotel with elevator  Discharge Living Setting Plans for Discharge Living Setting: Lives with (comment) (aunt) Type of Home at Discharge: House Discharge Home Layout: One level Discharge Home Access: Stairs to enter Entrance Stairs-Rails: None Entrance Stairs-Number of Steps: 1 Discharge Bathroom Shower/Tub: Walk-in shower Discharge Bathroom Toilet: Standard Discharge Bathroom Accessibility: Yes How Accessible: Accessible via walker Does the patient have any problems obtaining your medications?: No  Social/Family/Support Systems Anticipated Caregiver: patient's aunt primary Levorn Cook, dad Geroge) and other family  members Anticipated Caregiver's Contact Information: [place] [phone] Ability/Limitations of Caregiver: none stated Caregiver Availability: 24/7 Discharge Plan Discussed with Primary Caregiver: Yes Is Caregiver In Agreement with Plan?: Yes Does Caregiver/Family have Issues with Lodging/Transportation while Pt is in Rehab?: No  Goals Patient/Family Goal for Rehab: PT/OT supervision to min assist w/c level, SLP n/a Expected length of stay: 28-30 days Additional Information: Discharge plan: home to patient's aunt's home with aunt as 24/7 and other family in to provide physical assist Pt/Family Agrees to Admission and willing to participate: Yes Program Orientation Provided & Reviewed with Pt/Caregiver Including Roles  & Responsibilities: Yes  Decrease burden of Care  through IP rehab admission: n/a  Possible need for SNF placement upon discharge: Not anticipated.  Plan for discharge home to aunt's home (address in chart) with Levorn providing 24/7 support.  Pt's father and other family members at bedside confirm they will provide additional support so pt has 24/7 physical care available.  We also discussed w/c level goals.    Patient Condition: This patient's medical and functional status has changed since the consult dated: 05/11/2024 in which the Rehabilitation Physician determined and documented that the patient's condition is appropriate for intensive rehabilitative care in an inpatient rehabilitation facility. See History of Present Illness (above) for medical update. Functional changes are: pt max +2 for lateral scoot transfers. Patient's medical and functional status update has been discussed with the Rehabilitation physician and patient remains appropriate for inpatient rehabilitation. Will admit to inpatient rehab today.  Preadmission Screen Completed By:  Reche FORBES Lowers, PT, DPT 05/16/2024 12:16 PM ______________________________________________________________________   Discussed  status with Dr. Babs on 05/16/24  at 12:16 PM  and received approval for admission today.  Admission Coordinator:  Caitlin E Warren, PT, time 12:16 PM Pattricia 05/16/24    Assessment/Plan: Diagnosis: T2 pathological fx Does the need for close, 24 hr/day Medical supervision in concert with the patient's rehab needs make it unreasonable for this patient to be served in a less intensive setting? Yes Co-Morbidities requiring supervision/potential complications: thyroid  cancer, pain, PE Due to bladder management, bowel management, safety, skin/wound care, disease management, medication administration, pain management, and patient education, does the patient require 24 hr/day rehab nursing? Yes Does the patient require coordinated care of a physician, rehab nurse, PT, OT, to address physical and functional deficits in the context of the above medical diagnosis(es)? Yes Addressing deficits in the following areas: balance, endurance, locomotion, strength, transferring, bowel/bladder control, bathing, dressing, feeding, toileting, and psychosocial support Can the patient actively participate in an intensive therapy program of at least 3 hrs of therapy 5 days a week? Yes The potential for patient to make measurable gains while on inpatient rehab is excellent Anticipated functional outcomes upon discharge from inpatient rehab: supervision and min assist PT, supervision and min assist OT, n/a SLP Estimated rehab length of stay to reach the above functional goals is: 28-30 days Anticipated discharge destination: Home 10. Overall Rehab/Functional Prognosis: excellent   MD Signature: Arthea IVAR Babs, MD, [facility] Physical Medicine & Rehabilitation Medical Director Rehabilitation Services 05/16/2024

## 2024-05-16 NOTE — Progress Notes (Signed)
 Physical Therapy Treatment Patient Details Name: Joseph [name] MRN: [id number] DOB: [birth year] Today's Date: 05/16/2024   History of Present Illness Joseph [name] is a 43 yo male who presented with 4 days of back pain and difficulty walking.  CTA chest PE protocol showed right upper lobe segmental PE. MRI of the thoracic spine ultimately showed pathological T2 compression fracture. Thyroid  ultrasound showed large left thyroid  nodule. 11/12, IR performed US  guided left lobe thyroid  nodule biopsy.  11/13: S/p posterior T1-3 instrumentation fusion T2 corpectomy for resection of tumor.  Thyroid  biopsy showed papillary carcinoma. PMHx: none.    PT Comments  PT focus of session on repeated transfer training to improve pt form, tolerance, and assist level. Pt demonstrating improved bed mobility technique and good carryover of form during transfers this session, though still requiring mod-max +2 assist for transfer-level mobility. Pt tolerating repeated stands from EOB, benefits from elevated bed height and use of bed pad to boost hips. PT to continue to follow, plan remains appropriate.      If plan is discharge home, recommend the following: Two people to help with walking and/or transfers;Two people to help with bathing/dressing/bathroom   Can travel by private vehicle        Equipment Recommendations  Other (comment);Rolling walker (2 wheels)    Recommendations for Other Services       Precautions / Restrictions Precautions Precautions: Fall;Back Precaution Booklet Issued: No Recall of Precautions/Restrictions: Intact Precaution/Restrictions Comments: painful R shoulder, present prior to admission but pt states worse now Required Braces or Orthoses: Sling Restrictions Weight Bearing Restrictions Per Provider Order: Yes Other Position/Activity Restrictions: Dr. Darnella via secure chat 11/17 - WBAT all extremities. NWB through RUE due to pain.     Mobility  Bed Mobility Overal bed  mobility: Needs Assistance Bed Mobility: Supine to Sit     Supine to sit: Mod assist     General bed mobility comments: +1 assist for bed mobility with date, improved tolerance. assist for trunk elevation via LUE HHA, scooting to EOB, hip positioning EOB.    Transfers Overall transfer level: Needs assistance Equipment used: 2 person hand held assist Transfers: Sit to/from Stand, Bed to chair/wheelchair/BSC Sit to Stand: Max assist, +2 physical assistance, +2 safety/equipment          Lateral/Scoot Transfers: Max assist, +2 safety/equipment, +2 physical assistance General transfer comment: 3x STS attempts with max A +2, he was able to obtain full upright standing 1x. Straight back chair placed in front of patient to offer LUE support in standing. Unable to offload his feet to progress to a pivot. Lateral scoot from bed to chair with max A +2, pt demonstrated great carryover of technique from last session    Ambulation/Gait                   Stairs             Wheelchair Mobility     Tilt Bed    Modified Rankin (Stroke Patients Only)       Balance Overall balance assessment: History of Falls, Needs assistance Sitting-balance support: Feet supported Sitting balance-Leahy Scale: Fair     Standing balance support: Bilateral upper extremity supported, During functional activity, Reliant on assistive device for balance Standing balance-Leahy Scale: Poor Standing balance comment: tolerated standing for ~10 seconds                            Communication Communication  Communication: No apparent difficulties  Cognition Arousal: Alert Behavior During Therapy: WFL for tasks assessed/performed, Anxious   PT - Cognitive impairments: No apparent impairments                         Following commands: Intact      Cueing Cueing Techniques: Verbal cues  Exercises      General Comments General comments (skin integrity, edema, etc.):  VSS      Pertinent Vitals/Pain Pain Assessment Pain Assessment: 0-10 Pain Score: 8  Pain Location: R shoulder, back Pain Descriptors / Indicators: Constant, Discomfort, Grimacing, Guarding Pain Intervention(s): Monitored during session, Limited activity within patient's tolerance, Repositioned    Home Living                          Prior Function            PT Goals (current goals can now be found in the care plan section) Acute Rehab PT Goals Patient Stated Goal: to get stronger and go home PT Goal Formulation: With patient Time For Goal Achievement: 05/23/24 Potential to Achieve Goals: Good Progress towards PT goals: Progressing toward goals    Frequency    Min 3X/week      PT Plan      Co-evaluation   Reason for Co-Treatment: Complexity of the patient's impairments (multi-system involvement);To address functional/ADL transfers;For patient/therapist safety PT goals addressed during session: Mobility/safety with mobility;Balance OT goals addressed during session: ADL's and self-care      AM-PAC PT 6 Clicks Mobility   Outcome Measure  Help needed turning from your back to your side while in a flat bed without using bedrails?: A Lot Help needed moving from lying on your back to sitting on the side of a flat bed without using bedrails?: A Lot Help needed moving to and from a bed to a chair (including a wheelchair)?: Total Help needed standing up from a chair using your arms (e.g., wheelchair or bedside chair)?: Total Help needed to walk in hospital room?: Total Help needed climbing 3-5 steps with a railing? : Total 6 Click Score: 8    End of Session Equipment Utilized During Treatment: Gait belt;Other (comment) (RUE sling) Activity Tolerance: Patient limited by fatigue Patient left: with call bell/phone within reach;in chair;with chair alarm set Nurse Communication: Mobility status;Need for lift equipment (stedy back to bed) PT Visit Diagnosis:  Unsteadiness on feet (R26.81);Other abnormalities of gait and mobility (R26.89);Difficulty in walking, not elsewhere classified (R26.2);Pain Pain - Right/Left: Right Pain - part of body: Shoulder;Arm     Time: 1029-1101 PT Time Calculation (min) (ACUTE ONLY): 32 min  Charges:    $Therapeutic Activity: 8-22 mins PT General Charges $$ ACUTE PT VISIT: 1 Visit                     Johana RAMAN, PT DPT Acute Rehabilitation Services Secure Chat Preferred  Office [phone]    Latima Hamza E Johna 05/16/2024, 12:03 PM

## 2024-05-16 NOTE — Progress Notes (Signed)
 Joseph Sven SQUIBB, MD  Physician Physical Medicine and Rehabilitation   Consult Note    Addendum   Date of Service: 05/11/2024 11:27 AM  Related encounter: ED to Hosp-Admission (Current) from 04/27/2024 in [facility]   Expand All Collapse All           Physical Medicine and Rehabilitation Consult Reason for Consult: Deficits s/p spinal surgery Referring Physician: Joette Pebbles, MD     HPI: Joseph [name] is a 43 y.o. male with no significant PMH, who presented to the hospital with about 4 days of back pain and difficulty walking. He was having some back pain for 2 weeks.CT scan of the lumbar spine was unrevealing. CTA chest showed PE and he was started on Eliquis . He continues to have difficulty with mobility. MRI of th thoracic spine showed T2 compression fracture. Thyroid  US  showed large left thyroid  nodule. He was transferred to [facility] for surgery on 11/12. IR performed an US  guided left lobe thyroid  nodule biopsy. On 11/13 he underwent posterior T-1-3 instrumentation fusion T2 corpectomy for resection of tumor. Thyroid  biopsy showed papillary carcinoma. Oncology was consulted and he was seen on 11/14. Physical Medicine & Rehabilitation was consulted to assess candidacy for CIR.         Home: Home Living Family/patient expects to be discharged to:: Private residence Living Arrangements: Other (Comment) Available Help at Discharge: Family, Available 24 hours/day Type of Home: House Home Access: Stairs to enter [employer] of Steps: 1 Home Layout: One level Bathroom Shower/Tub: Health Visitor: Handicapped height Home Equipment: Medical Laboratory Scientific Officer - single point, Information systems manager, BSC/3in1 Additional Comments: pt currently in second story hotel with elevator  Functional History: Prior Function Prior Level of Function : Independent/Modified Independent, History of Falls (last six months) Mobility Comments: indep, no AD, 1  recent fall due to BLEs giving out ADLs Comments: indep, drives Functional Status:  Mobility: Bed Mobility Overal bed mobility: Needs Assistance Bed Mobility: Rolling, Sidelying to Sit Rolling: Mod assist Sidelying to sit: Mod assist, +2 for physical assistance, +2 for safety/equipment Supine to sit: Mod assist, +2 for safety/equipment, +2 for physical assistance, HOB elevated, Used rails General bed mobility comments: log roll, assist for trunk elevation and LE progression over EOB Transfers Overall transfer level: Needs assistance Equipment used: Rolling walker (2 wheels) Transfers: Sit to/from Stand, Bed to chair/wheelchair/BSC Sit to Stand: Max assist, +2 physical assistance, +2 safety/equipment Bed to/from chair/wheelchair/BSC transfer type:: Step pivot Step pivot transfers: Mod assist, +2 physical assistance, +2 safety/equipment Transfer via Lift Equipment: Stedy General transfer comment: assist for power up, rise, steady, Once standing tolerating marching in place and lateral stepping so progressed to step-pivot. Mod +2 for step pivot for truncal steadying, RUE management on RW, RW facilitation given RUE pain. Ambulation/Gait Ambulation/Gait assistance: Min assist Gait Distance (Feet): 10 Feet Assistive device: Rolling walker (2 wheels) Gait Pattern/deviations: Ataxic, Trunk flexed, Knees buckling General Gait Details: pt exhibits an erratic, ataxic like gait pattern with noted B LE instability and poor motor processing and planing R > L and varying stride lengths with reports of fatigue and recliner close Gait velocity: decr   ADL: ADL Overall ADL's : Needs assistance/impaired Eating/Feeding: Independent, Sitting Eating/Feeding Details (indicate cue type and reason): using L hand to self feed w/ some difficulty Grooming: Set up, Sitting Upper Body Bathing: Moderate assistance, Sitting Lower Body Bathing: Maximal assistance, +2 for physical assistance, +2 for  safety/equipment, Sit to/from stand Upper  Body Dressing : Maximal assistance, Sitting Lower Body Dressing: Maximal assistance, +2 for physical assistance, +2 for safety/equipment, Sit to/from stand Lower Body Dressing Details (indicate cue type and reason): assist to don underwear around feet, +2 to stand and then assist to pull up over bottom in standing Toilet Transfer: Moderate assistance, +2 for physical assistance, +2 for safety/equipment, Stand-pivot Toileting- Clothing Manipulation and Hygiene: Total assistance, Sit to/from stand Functional mobility during ADLs: Moderate assistance, +2 for physical assistance, +2 for safety/equipment General ADL Comments: Limited by RUE pain, BLE weakness, dizziness with standing and poor activity tolerace   Cognition: Cognition Orientation Level: Oriented X4 Cognition Arousal: Alert Behavior During Therapy: WFL for tasks assessed/performed     ROS +decreased sensation in bilateral feet     Past Medical History:  Diagnosis Date   Anxiety     Asthma      as a child   Depression     Narcotic abuse (HCC)     Pulmonary nodules 04/29/2024             Past Surgical History:  Procedure Laterality Date   CLOSED REDUCTION FINGER WITH PERCUTANEOUS PINNING Right 06/22/2013    Procedure: RIGHT HAND SMALL FINGER CLOSED MANIPULATION AND PINNING;  Surgeon: Prentice LELON Pagan, MD;  Location: [facility] OR;  Service: Orthopedics;  Laterality: Right;   I & D EXTREMITY   03/24/2012    Procedure: IRRIGATION AND DEBRIDEMENT EXTREMITY;  Surgeon: Oneil JAYSON Herald, MD;  Location: [facility] OR;  Service: Orthopedics;  Laterality: Right;  Right Forearm   POSTERIOR CERVICAL FUSION/FORAMINOTOMY N/A 05/05/2024    Procedure: POSTERIOR CERVICAL FUSION CERVICAL SEVEN-THORACIC ONE, THORACIC ONE-THORACIC TWO, THORACIC TWO-THORACIC THREE ,FOR RESECTION OF TUMOR THORACIC TWO CORPECTOMY;  Surgeon: Darnella Dorn SAUNDERS, MD;  Location: [facility] OR;  Service: Neurosurgery;  Laterality: N/A;              Family History  Problem Relation Age of Onset   Other Other     Alcohol abuse Other          Social History:  reports that he has been smoking cigarettes. He has a 10 pack-year smoking history. He has never used smokeless tobacco. He reports that he does not currently use alcohol. He reports current drug use. Drug: Marijuana. Allergies:  Allergies  No Known Allergies   No medications prior to admission.            Blood pressure 124/67, pulse 96, temperature 98.8 F (37.1 C), temperature source Oral, resp. rate 13, height 5' 9 (1.753 m), weight (!) 143.5 kg, SpO2 92%. Physical Exam Gen: no distress, normal appearing HEENT: oral mucosa pink and moist, NCAT Cardio: Reg rate Chest: normal effort, normal rate of breathing Abd: +central obesity Ext: no edema Psych: pleasant, normal affect Skin: +incisional vac Neuro: Alert and oriented x3. 5/5 strength in upper extremities, 4/5 strength in bilateral lower extremities, decreased sensation to bilateral feet   Lab Results Last 24 Hours       Results for orders placed or performed during the hospital encounter of 04/27/24 (from the past 24 hours)  CBC     Status: Abnormal    Collection Time: 05/11/24  4:11 AM  Result Value Ref Range    WBC 19.7 (H) 4.0 - 10.5 K/uL    RBC 3.36 (L) 4.22 - 5.81 MIL/uL    Hemoglobin 10.1 (L) 13.0 - 17.0 g/dL    HCT 69.0 (L) 60.9 - 52.0 %    MCV 92.0 80.0 - 100.0  fL    MCH 30.1 26.0 - 34.0 pg    MCHC 32.7 30.0 - 36.0 g/dL    RDW 85.9 88.4 - 84.4 %    Platelets PLATELET CLUMPS NOTED ON SMEAR, UNABLE TO ESTIMATE 150 - 400 K/uL    nRBC 0.0 0.0 - 0.2 %  APTT     Status: Abnormal    Collection Time: 05/11/24  4:11 AM  Result Value Ref Range    aPTT 38 (H) 24 - 36 seconds      Imaging Results (Last 48 hours)  No results found.     Assessment/Plan: Diagnosis: s/p thoracic spinal surgery Does the need for close, 24 hr/day medical supervision in concert with the patient's rehab needs make it  unreasonable for this patient to be served in a less intensive setting? Yes Co-Morbidities requiring supervision/potential complications:  1) class 3 obesity: provide dietary education 2) constipation: consider aggressive bowel regimen 3) chronic right shoulder pain: continue PT 4) Acute blood loss anemia: continue to monitor Hgb 5) PE: plan to restart anticoagulation tomorrow   Due to bladder management, bowel management, safety, skin/wound care, disease management, medication administration, pain management, and patient education, does the patient require 24 hr/day rehab nursing? Yes Does the patient require coordinated care of a physician, rehab nurse, therapy disciplines of PT, OT to address physical and functional deficits in the context of the above medical diagnosis(es)? Yes Addressing deficits in the following areas: balance, endurance, locomotion, strength, transferring, bowel/bladder control, bathing, dressing, feeding, grooming, toileting, and psychosocial support Can the patient actively participate in an intensive therapy program of at least 3 hrs of therapy per day at least 5 days per week? Yes The potential for patient to make measurable gains while on inpatient rehab is excellent Anticipated functional outcomes upon discharge from inpatient rehab are min assist  with PT, min assist with OT, independent with SLP. Estimated rehab length of stay to reach the above functional goals is: 12-16 days Anticipated discharge destination: Home Overall Rehab/Functional Prognosis: excellent   POST ACUTE RECOMMENDATIONS: This patient's condition is appropriate for continued rehabilitative care in the following setting: CIR Patient has agreed to participate in recommended program. Yes Note that insurance prior authorization may be required for reimbursement for recommended care.     I have personally performed a face to face diagnostic evaluation of this patient. Additionally, I have examined  the patient's medical record including any pertinent labs and radiographic images.     Thanks,   Sven SHAUNNA Elks, MD 05/11/2024        Revision History

## 2024-05-16 NOTE — Progress Notes (Signed)
 Daily Progress Note   Patient Name: Joseph [name]       Date: 05/16/2024 DOB: Dec 10, 1980  Age: 43 y.o. MRN#: [id number] Attending Physician: Verdene Purchase, MD Primary Care Physician: Patient, No Pcp Per Admit Date: 04/27/2024  Reason for Consultation/Follow-up: Pain control  Length of Stay: 14  Current Medications: Scheduled Meds:   acetaminophen   1,000 mg Oral Q8H   apixaban   10 mg Oral BID   Followed by   NOREEN ON 05/21/2024] apixaban   5 mg Oral BID   Chlorhexidine  Gluconate Cloth  6 each Topical Daily   cyclobenzaprine   10 mg Oral TID   methocarbamol   500 mg Oral TID   multivitamin with minerals  1 tablet Oral Daily   oxyCODONE   90 mg Oral Q8H   polyethylene glycol  17 g Oral BID   Ensure Max Protein  11 oz Oral BID   senna-docusate  2 tablet Oral BID   sodium chloride  flush  10-40 mL Intracatheter Q12H   sodium chloride  flush  3 mL Intravenous Q12H    Continuous Infusions:   PRN Meds: bisacodyl , HYDROmorphone  (DILAUDID ) injection, ondansetron  **OR** ondansetron  (ZOFRAN ) IV, oxyCODONE , sodium chloride  flush, sodium phosphate   Physical Exam Vitals reviewed.  Constitutional:      General: He is not in acute distress. HENT:     Head: Normocephalic and atraumatic.  Cardiovascular:     Rate and Rhythm: Normal rate.  Pulmonary:     Effort: Pulmonary effort is normal.  Skin:    General: Skin is warm and dry.  Neurological:     Mental Status: He is alert, oriented to person, place, and time and easily aroused.  Psychiatric:        Behavior: Behavior normal.             Vital Signs: BP (!) 164/85 (BP Location: Right Leg)   Pulse 90   Temp 98 F (36.7 C) (Oral)   Resp 18   Ht 5' 9 (1.753 m)   Wt (!) 143.5 kg   SpO2 96%   BMI 46.72 kg/m  SpO2: SpO2: 96  % O2 Device: O2 Device: Room Air O2 Flow Rate: O2 Flow Rate (L/min): 0 L/min    Patient Active Problem List   Diagnosis Date Noted   Papillary thyroid  carcinoma (HCC) 05/10/2024   Malignant neoplasm  metastatic to both lungs (HCC) 05/10/2024   Metastatic cancer to spine (HCC) 05/06/2024   Papillary carcinoma (HCC) 05/06/2024   Thoracic spine fracture (HCC) 05/02/2024   Ataxia 05/01/2024   Right shoulder pain 05/01/2024   Pulmonary HTN (HCC) 04/29/2024   Low back pain 04/29/2024   Pulmonary nodules 04/29/2024   Thyroid  nodule 04/29/2024   Acute pulmonary embolism (HCC) 04/28/2024   Abscess 03/31/2018   Thrombocytopenia 03/31/2018   Depression 03/31/2018   Abscess of hand 03/31/2018   Abscess of right hand 03/31/2018   GAD (generalized anxiety disorder) 12/04/2014   Alcohol use disorder, severe, dependence (HCC) 12/03/2014   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 male  with past medical history of no significant medical history presented to the hospital with about 4 days of back pain and difficulty walking, he was having some back pain issues for about 2 weeks.  Workup revealed pathological T2 compression fracture and large left thyroid  nodule for which she was transferred to [facility] from [facility] for neurosurgery.  He was admitted on 04/27/2024 with metastatic papillary thyroid  cancer, T2 pathological fracture with compression myelopathy, C6 lucent lesion suspected metastatic disease, left thyroid  enlargement, pulmonary nodules suspected metastatic disease, right upper lobe segmental PE, pulmonary artery hypertension, acute blood loss anemia, thrombocytopenia, chronic  right shoulder pain, constipation, and others.    Palliative medicine was consulted for assistance with cancer associated pain management.   Today's Discussion: Reviewed chart. Patient continues with Oxycontin  90 mg three times a day. In the last 24 hours he required four doses of IV dilaudid  1 mg and four doses of oral oxycodone  20 mg. His last bowel movement was today  Patient is awake with nursing and family member at bedside. We reviewed his pain medication requirements over the last 24 hours. Patient reports his pain level has been higher over the last 24 hours. We discussed increasing his Oxycontin  dose. We discussed his plan to discharge to CIR today and follow up with oncology after he is discharged.     Encouraged the patient to reach out to PMT with needs. PMT will continue to monitor.  Recommendations/Plan: Full code Full scope of care Recommend increasing OxyContin  to 100 mg po every 8 hours See full symptom management below Referral for outpatient palliative care in the cancer center Patient to go to CIR for rehab  Symptom Management: Tylenol  1000 mg p.o. every 8 hours Flexeril  10 mg p.o. 3 times daily Robaxin  500 mg p.o. 3 times daily Oxycodone  IR 20 mg p.o. every 4 hours as needed moderate to severe pain Dilaudid  1 mg IV every 3 hours as needed breakthrough severe pain not relieved by oxycodone  IR Dulcolax EC 5 mg p.o. daily as needed moderate constipation MiraLAX  17 g p.o. twice daily Senokot S 2 tablets p.o. twice daily   Code Status:    Code Status Orders  (From admission, onward)           Start     Ordered   04/28/24 2016  Full code  Continuous       Question:  By:  Answer:  Consent: discussion documented in EHR   04/28/24 2016         Extensive chart  review has been completed prior to seeing the patient including labs, vital signs, imaging, progress/consult notes, orders, medications, and available advance directive documents.  Care plan was  discussed with Dr. Verdene  Time spent: 35 minutes    Thank you for allowing the Palliative Medicine Team to assist in the care of this patient.  SABRA Stephane CHRISTELLA Dannette, NP  Please contact Palliative Medicine Team phone at [phone] for questions and concerns.

## 2024-05-16 NOTE — TOC Transition Note (Signed)
 Transition of Care (TOC) - Discharge Note Rayfield Gobble RN,BSN Inpatient Care Management Unit 4NP (Non Trauma)- RN Case Manager See Treatment Team for direct Phone #   Patient Details  Name: Joseph [name] MRN: [id number] Date of Birth: 1980/11/24  Transition of Care [facility]) CM/SW Contact:  Gobble Rayfield Hurst, RN Phone Number: 05/16/2024, 12:30 PM   Clinical Narrative:    Noted per CIR liaison note that pt has received insurance approval for INPT rehab and they have a bed to admit today.   Pt has been cleared to transition to [facility] INPT rehab, unit staff will transport to rehab unit once bed has been assigned this afternoon.   No further IP CM needs noted.    Final next level of care: IP Rehab Facility Barriers to Discharge: Barriers Resolved   Patient Goals and CMS Choice Patient states their goals for this hospitalization and ongoing recovery are:: return home   Choice offered to / list presented to : Patient      Discharge Placement               [facility] INPT rehab        Discharge Plan and Services Additional resources added to the After Visit Summary for     Discharge Planning Services: CM Consult Post Acute Care Choice: IP Rehab          DME Arranged: N/A         HH Arranged: NA HH Agency: NA        Social Drivers of Health (SDOH) Interventions SDOH Screenings   Tobacco Use: High Risk (05/05/2024)     Readmission Risk Interventions    05/16/2024   12:30 PM  Readmission Risk Prevention Plan  Transportation Screening Complete  HRI or Home Care Consult Complete  Social Work Consult for Recovery Care Planning/Counseling Complete  Palliative Care Screening Not Applicable  Medication Review Oceanographer) Complete

## 2024-05-16 NOTE — Telephone Encounter (Signed)
 Pharmacy Patient Advocate Encounter  Insurance verification completed.    The patient is insured through [employer] [place].     Ran test claim for Eliquis  5mg  and the current 30 day co-pay is $4.   This test claim was processed through [facility]- copay amounts may vary at other pharmacies due to [employer], or as the patient moves through the different stages of their insurance plan.

## 2024-05-16 NOTE — Progress Notes (Signed)
 Babs Arthea DASEN, MD  Physician Physical Medicine and Rehabilitation   PMR Pre-admission    Signed   Date of Service: 05/16/2024 10:17 AM  Related encounter: ED to Hosp-Admission (Current) from 04/27/2024 in [place] [facility]   Signed     Expand All Collapse All  PMR Admission Coordinator Pre-Admission Assessment   Patient: Joseph [name] is an 43 y.o., male MRN: [id number] DOB: 07-20-1980 Height: 5' 9 (175.3 cm) Weight: (!) 143.5 kg   Insurance Information HMO:     PPO:      PCP:      IPA:      80/20:      OTHER:  PRIMARY: [employer]      Policy#: [id number]      Subscriber: pt CM Name: faxed approval from Lisa Koziol    Phone#: [phone]     Fax#: [phone] Pre-Cert#: [id number] auth for CIR from [place] with [employer] for admit 05/13/24 with next review date 05/20/24.  Updates due to [place] at fax listed above.        Employer: n/a Benefits:  Phone #: [phone]     Name:  Eff. Date: 02/22/23     Deduct: $0      Out of Pocket Max: $0      Life Max:  CIR: 100%      SNF: 100% Outpatient:      Co-Pay: $4/visit Home Health:       Co-Pay: $4/visit DME: 100%     Co-Pay:  Providers:  SECONDARY:       Policy#:      Phone#:    Artist:       Phone#:    The "Data Collection Information Summary" for patients in Inpatient Rehabilitation Facilities with attached "Privacy Act Statement-Health Care Records" was provided and verbally reviewed with: Patient and Family   Emergency Contact Information Contact Information       Name Relation Home Work Mobile    [place] Mother [phone]             Other Contacts       Name Relation Home Work Mobile    [place] Father     [phone]           Current Medical History  Patient Admitting Diagnosis: pathological SCI   History of Present Illness: Pt is a 43 y/o male with PMH of asthma, anxiety, and PSA who presented to [facility] on [facility]5 with c/o low back  pain, rib pain for several weeks until he was having difficulty walking and saddle anesthesia.  In ED he was hemodynamically stable, and xray showed possible LUL nodule, and degenerative changes in the lumbar spine.  CT of lumbar spine showed large Schmorl's node at the L5 endplate.  CTA chest showed RUL segmental PE with incidental RLL nodules, enlarge L lobe of the thyroid , and gallstones.  He was started on heparin  gtt and admitted to [facility] on 11/6. He continued to have difficulty with ambulation and LE sensory changes so neurology was consulted and recommended full spine MRI which showed a pathologic fracture at T2 with several spinal canal stenosis and cord compression L>R with subtle thoracic cord signal change suggesting early cord edema.  Xray shoulder showed humeral lucency concerning for malignancy.  He was then transferred to [facility] on 11/9 for neurosurgery consultation.  Recommendations were for operative intervention after eliquis  wash out.  Pt underwent a posterior T1-3 instrumentation and  fusion, with T1-3 laminectomy, T2 corpectomy for resection of metastatic tumor per dr. Darnella on 11/13.  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  Oncology was consulted and recommend outpatient follow up with ENT, radiation oncology, and endocrinology.  Palliative consulted for goals and pain management.  He has a prevena vac to his surgical incision which will be d/c'd on 11/27.  He has been weaned off of dexamethasone .  He is currently on eliquis .  He did develop a fever the week of 11/17 but workup was negative and he has been afebrile with WBC trending down.  His PICC line was d/c'd on 11/24 in anticipation of transition to CIR.  Therapy ongoing and pt was recommended for CIR.    Patient's medical record from [facility] has been reviewed by the rehabilitation admission coordinator and physician.    Past Medical History      Past Medical History:  Diagnosis Date   Anxiety     Asthma      as a child   Depression     Narcotic abuse (HCC)     Pulmonary nodules 04/29/2024          Has the patient had major surgery during 100 days prior to admission? Yes   Family History   family history includes Alcohol abuse in an other family member; Other in an other family member.   Current Medications  Current Medications    Current Facility-Administered Medications:    acetaminophen  (TYLENOL ) tablet 1,000 mg, 1,000 mg, Oral, Q8H, Darnella, Jonathan R, MD, 1,000 mg at 05/16/24 9363   apixaban  (ELIQUIS ) tablet 10 mg, 10 mg, Oral, BID, 10 mg at 05/16/24 0823 **FOLLOWED BY** [START ON 05/21/2024] apixaban  (ELIQUIS ) tablet 5 mg, 5 mg, Oral, BID, Millen, Jessica B, RPH   bisacodyl  (DULCOLAX) EC tablet 5 mg, 5 mg, Oral, Daily PRN, Garst, Jonathan R, MD, 5 mg at 05/12/24 9164   Chlorhexidine  Gluconate Cloth 2 % PADS 6 each, 6 each, Topical, Daily, Darnella Dorn SAUNDERS, MD, 6 each at 05/16/24 0825   cyclobenzaprine  (FLEXERIL ) tablet 10 mg, 10 mg, Oral, TID, Garst, Jonathan R, MD, 10 mg at 05/16/24 9175   HYDROmorphone  (DILAUDID ) injection 1 mg, 1 mg, Intravenous, Q3H PRN, Marvis Camellia LABOR, NP, 1 mg at 05/16/24 0749   magnesium  sulfate IVPB 4 g 100 mL, 4 g, Intravenous, Once, Krishnan, Gokul, MD, Last Rate: 50 mL/hr at 05/16/24 1114, 4 g at 05/16/24 1114   methocarbamol  (ROBAXIN ) tablet 500 mg, 500 mg, Oral, TID, Darnella Dorn SAUNDERS, MD, 500 mg at 05/16/24 9176   multivitamin with minerals tablet 1 tablet, 1 tablet, Oral, Daily, Hongalgi, Anand D, MD, 1 tablet at 05/16/24 9176   ondansetron  (ZOFRAN ) tablet 4 mg, 4 mg, Oral, Q6H PRN **OR** ondansetron  (ZOFRAN ) injection 4 mg, 4 mg, Intravenous, Q6H PRN, Darnella Dorn SAUNDERS, MD   oxyCODONE  (Oxy IR/ROXICODONE ) immediate release tablet 20 mg, 20 mg, Oral, Q4H PRN, Marvis Camellia A, NP, 20 mg at 05/16/24 1155   oxyCODONE  (OXYCONTIN ) 12 hr tablet 90 mg, 90 mg, Oral, Q8H,  Gill, Eric A, NP, 90 mg at 05/16/24 0636   polyethylene glycol (MIRALAX  / GLYCOLAX ) packet 17 g, 17 g, Oral, BID, Hongalgi, Anand D, MD, 17 g at 05/16/24 9175   protein supplement (ENSURE MAX) liquid, 11 oz, Oral, BID, Hongalgi, Anand D, MD, 11 oz at 05/16/24 0825   senna-docusate (Senokot-S) tablet 2 tablet, 2 tablet, Oral, BID, Hongalgi,  Trenda BIRCH, MD, 2 tablet at 05/16/24 9176   sodium chloride  flush (NS) 0.9 % injection 10-40 mL, 10-40 mL, Intracatheter, Q12H, Darnella Dorn SAUNDERS, MD, 20 mL at 05/16/24 0825   sodium chloride  flush (NS) 0.9 % injection 10-40 mL, 10-40 mL, Intracatheter, PRN, Darnella Dorn SAUNDERS, MD   sodium chloride  flush (NS) 0.9 % injection 3 mL, 3 mL, Intravenous, Q12H, Darnella Dorn SAUNDERS, MD, 3 mL at 05/16/24 0825   sodium phosphate  (FLEET) enema 1 enema, 1 enema, Rectal, Daily PRN, Hongalgi, Anand D, MD     Patients Current Diet:  Diet Order                  Diet regular Room service appropriate? Yes; Fluid consistency: Thin  Diet effective now                         Precautions / Restrictions Precautions Precautions: Fall, Back Precaution Booklet Issued: No Precaution/Restrictions Comments: painful R shoulder, present prior to admission but pt states worse now Restrictions Weight Bearing Restrictions Per Provider Order: Yes RUE Weight Bearing Per Provider Order: Non weight bearing RLE Weight Bearing Per Provider Order: Non weight bearing LLE Weight Bearing Per Provider Order: Non weight bearing Other Position/Activity Restrictions: Dr. Darnella via secure chat 11/17 - WBAT all extremities. NWB through RUE due to pain.    Has the patient had 2 or more falls or a fall with injury in the past year? Yes   Prior Activity Level Community (5-7x/wk): independent without DME prior to admit, driving   Prior Functional Level Self Care: Did the patient need help bathing, dressing, using the toilet or eating? Independent   Indoor Mobility: Did the patient need  assistance with walking from room to room (with or without device)? Independent   Stairs: Did the patient need assistance with internal or external stairs (with or without device)? Independent   Functional Cognition: Did the patient need help planning regular tasks such as shopping or remembering to take medications? Independent   Patient Information Are you of Hispanic, Latino/a,or Spanish origin?: A. No, not of Hispanic, Latino/a, or Spanish origin What is your race?: B. Black or African American Do you need or want an interpreter to communicate with a doctor or health care staff?: 0. No   Patient's Response To:  Health Literacy and Transportation Is the patient able to respond to health literacy and transportation needs?: Yes Health Literacy - How often do you need to have someone help you when you read instructions, pamphlets, or other written material from your doctor or pharmacy?: Never In the past 12 months, has lack of transportation kept you from medical appointments or from getting medications?: No In the past 12 months, has lack of transportation kept you from meetings, work, or from getting things needed for daily living?: No   Journalist, Newspaper / Equipment Home Equipment: Cane - single point, Information systems manager, BSC/3in1   Prior Device Use: Indicate devices/aids used by the patient prior to current illness, exacerbation or injury? None of the above   Current Functional Level Cognition   Orientation Level: Oriented X4    Extremity Assessment (includes Sensation/Coordination)   Upper Extremity Assessment: RUE deficits/detail RUE Deficits / Details: significant pain, maintained NWB and no ROM due to pain. Left pt out of the sling at the end of the session to encourage elbow, wrist and hand ROM RUE Sensation: WNL RUE Coordination: decreased gross motor  Lower Extremity Assessment: Defer  to PT evaluation     ADLs   Overall ADL's : Needs assistance/impaired Eating/Feeding:  Independent, Sitting Eating/Feeding Details (indicate cue type and reason): to drink from cup with straw with LUE Grooming: Set up, Sitting Upper Body Bathing: Moderate assistance, Sitting Lower Body Bathing: Maximal assistance, +2 for physical assistance, +2 for safety/equipment, Sit to/from stand Upper Body Dressing : Maximal assistance, Sitting Lower Body Dressing: Total assistance Lower Body Dressing Details (indicate cue type and reason): unable to get underwear on today despite total A +2 Toilet Transfer: Maximal assistance, +2 for physical assistance, +2 for safety/equipment Toilet Transfer Details (indicate cue type and reason): attempted standing, squat pivoting and lateral scoots to progress towards OOB toileting Toileting- Clothing Manipulation and Hygiene: Total assistance, Sit to/from stand Functional mobility during ADLs: Maximal assistance, +2 for physical assistance, +2 for safety/equipment General ADL Comments: continues to be limited by significant pain and weakness     Mobility   Overal bed mobility: Needs Assistance Bed Mobility: Supine to Sit Rolling: +2 for safety/equipment, +2 for physical assistance, Min assist Sidelying to sit: Mod assist Supine to sit: Mod assist Sit to sidelying: Mod assist, +2 for physical assistance General bed mobility comments: +1 assist for bed mobility with date, improved tolerance. assist for trunk elevation via LUE HHA, scooting to EOB, hip positioning EOB.     Transfers   Overall transfer level: Needs assistance Equipment used: 2 person hand held assist Transfers: Sit to/from Stand, Bed to chair/wheelchair/BSC Sit to Stand: Max assist, +2 physical assistance, +2 safety/equipment Bed to/from chair/wheelchair/BSC transfer type:: Lateral/scoot transfer Step pivot transfers: Mod assist, +2 physical assistance, +2 safety/equipment  Lateral/Scoot Transfers: Max assist, +2 safety/equipment, +2 physical assistance Transfer via Lift  Equipment: Stedy General transfer comment: 3x STS attempts with max A +2, he was able to obtain full upright standing 1x. Straight back chair placed in front of patient to offer LUE support in standing. Unable to offload his feet to progress to a pivot. Lateral scoot from bed to chair with max A +2, pt demonstrated great carryover of technique from last session     Ambulation / Gait / Stairs / Wheelchair Mobility   Ambulation/Gait Ambulation/Gait assistance: Editor, Commissioning (Feet): 10 Feet Assistive device: Rolling walker (2 wheels) Gait Pattern/deviations: Ataxic, Trunk flexed, Knees buckling General Gait Details: unable this date, given severe pain Gait velocity: decr     Posture / Balance Balance Overall balance assessment: History of Falls, Needs assistance Sitting-balance support: Feet supported Sitting balance-Leahy Scale: Fair Standing balance support: Bilateral upper extremity supported, During functional activity, Reliant on assistive device for balance Standing balance-Leahy Scale: Poor Standing balance comment: tolerated standing for ~10 seconds     Special considerations/life events  Wound Vac yes to upper back, Skin surgical incision to upper back, and Special service needs will need ENT. Radiation, and oncology f/u as an outpatient     Previous Home Environment (from acute therapy documentation) Living Arrangements: Other (Comment) Available Help at Discharge: Family, Available 24 hours/day Type of Home: House Home Layout: One level Home Access: Stairs to enter [employer] of Steps: 1 Bathroom Shower/Tub: Health Visitor: Handicapped height Home Care Services: No Additional Comments: pt currently in second story hotel with elevator   Discharge Living Setting Plans for Discharge Living Setting: Lives with (comment) (aunt) Type of Home at Discharge: House Discharge Home Layout: One level Discharge Home Access: Stairs to  enter Entrance Stairs-Rails: None Entrance Stairs-Number of Steps: 1 Discharge Bathroom Shower/Tub:  Walk-in shower Discharge Bathroom Toilet: Standard Discharge Bathroom Accessibility: Yes How Accessible: Accessible via walker Does the patient have any problems obtaining your medications?: No   Social/Family/Support Systems Anticipated Caregiver: patient's aunt primary Levorn Cook, dad Geroge) and other family members Anticipated Caregiver's Contact Information: [place] [phone] Ability/Limitations of Caregiver: none stated Caregiver Availability: 24/7 Discharge Plan Discussed with Primary Caregiver: Yes Is Caregiver In Agreement with Plan?: Yes Does Caregiver/Family have Issues with Lodging/Transportation while Pt is in Rehab?: No   Goals Patient/Family Goal for Rehab: PT/OT supervision to min assist w/c level, SLP n/a Expected length of stay: 28-30 days Additional Information: Discharge plan: home to patient's aunt's home with aunt as 24/7 and other family in to provide physical assist Pt/Family Agrees to Admission and willing to participate: Yes Program Orientation Provided & Reviewed with Pt/Caregiver Including Roles  & Responsibilities: Yes   Decrease burden of Care through IP rehab admission: n/a   Possible need for SNF placement upon discharge: Not anticipated.  Plan for discharge home to aunt's home (address in chart) with Levorn providing 24/7 support.  Pt's father and other family members at bedside confirm they will provide additional support so pt has 24/7 physical care available.  We also discussed w/c level goals.     Patient Condition: This patient's medical and functional status has changed since the consult dated: 05/11/2024 in which the Rehabilitation Physician determined and documented that the patient's condition is appropriate for intensive rehabilitative care in an inpatient rehabilitation facility. See History of Present Illness (above) for medical update.  Functional changes are: pt max +2 for lateral scoot transfers. Patient's medical and functional status update has been discussed with the Rehabilitation physician and patient remains appropriate for inpatient rehabilitation. Will admit to inpatient rehab today.   Preadmission Screen Completed By:  Reche FORBES Lowers, PT, DPT 05/16/2024 12:16 PM ______________________________________________________________________   Discussed status with Dr. Babs on 05/16/24  at 12:16 PM  and received approval for admission today.   Admission Coordinator:  Asusena Sigley E Ramonda Galyon, PT, time 12:16 PM Pattricia 05/16/24     Assessment/Plan: Diagnosis: T2 pathological fx Does the need for close, 24 hr/day Medical supervision in concert with the patient's rehab needs make it unreasonable for this patient to be served in a less intensive setting? Yes Co-Morbidities requiring supervision/potential complications: thyroid  cancer, pain, PE Due to bladder management, bowel management, safety, skin/wound care, disease management, medication administration, pain management, and patient education, does the patient require 24 hr/day rehab nursing? Yes Does the patient require coordinated care of a physician, rehab nurse, PT, OT, to address physical and functional deficits in the context of the above medical diagnosis(es)? Yes Addressing deficits in the following areas: balance, endurance, locomotion, strength, transferring, bowel/bladder control, bathing, dressing, feeding, toileting, and psychosocial support Can the patient actively participate in an intensive therapy program of at least 3 hrs of therapy 5 days a week? Yes The potential for patient to make measurable gains while on inpatient rehab is excellent Anticipated functional outcomes upon discharge from inpatient rehab: supervision and min assist PT, supervision and min assist OT, n/a SLP Estimated rehab length of stay to reach the above functional goals is: 28-30 days Anticipated  discharge destination: Home 10. Overall Rehab/Functional Prognosis: excellent     MD Signature: Arthea IVAR Babs, MD, [facility] Physical Medicine & Rehabilitation Medical Director Rehabilitation Services 05/16/2024          Revision History  Date/Time User Provider Type Action  05/16/2024 12:25 PM Swartz, Zachary  T, MD Physician Sign  05/16/2024 12:16 PM Butler Reche BRAVO, PT Rehab Admission Coordinator Share  05/16/2024 10:57 AM Butler Reche BRAVO, PT Rehab Admission Coordinator Share   View Details Report
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
## 2024-05-16 NOTE — Discharge Summary (Signed)
 [facility]  Physician Discharge Summary   Patient ID: Joseph [name] MRN: [id number] DOB/AGE: [birth year] 43 y.o.  Admit date: 04/27/2024 Discharge date: 05/16/2024    PCP: Patient, No Pcp Per  DISCHARGE DIAGNOSES:    Acute pulmonary embolism (HCC)   Pulmonary HTN (HCC)   Pulmonary nodules   Thyroid  nodule   Ataxia   Right shoulder pain   Thoracic spine fracture (HCC)   Metastatic cancer to spine (HCC)   Papillary thyroid  carcinoma (HCC)   Malignant neoplasm metastatic to both lungs (HCC)   RECOMMENDATIONS FOR OUTPATIENT FOLLOW UP: Patient being discharged to inpatient rehabilitation.   CODE STATUS: Full code  DISCHARGE CONDITION: fair  Diet recommendation: Regular  INITIAL HISTORY: 43 year old gentleman with no significant medical history presented to the hospital with about 4 days of back pain and difficulty walking, he was having some back pain issues for about 2 weeks.  CT scan of the lumbar spine was unrevealing.  CTA chest PE protocol showed right upper lobe segmental PE. He was started on Eliquis . Patient continued to have difficulty with mobility.  Multiple investigations were done.  MRI of the thoracic spine ultimately showed pathological T2 compression fracture. Thyroid  ultrasound showed large left thyroid  nodule.  He was transferred to [facility] from [place] for Neurosurgery.  11/12, IR performed US  guided left lobe thyroid  nodule biopsy.  11/13: S/p posterior T1-3 instrumentation fusion T2 corpectomy for resection of tumor.  Thyroid  biopsy showed papillary carcinoma, oncology consulted and seen on 11/14 and plan to follow-up this week.    HOSPITAL COURSE:   Metastatic papillary thyroid  cancer Back pain and difficulty walking T2 pathological fracture with compressive myelopathy C6 lucent lesion, metastatic disease suspected Left thyroid  enlargement Pulmonary nodules, suspected metastatic Neurosurgery consulted and postop management  per them.  11/13: S/p posterior T1-3 instrumentation fusion T2 corpectomy for resection of tumor.  Extensive procedure.  Hemorrhagic tumor.  1500 mL EBL.  Got 2 units of PRBC across surgery.   JP drain removed.  Wound VAC was removed.  Prevena suction machine was applied on 11/20.  This is to stay in place until 11/27.   Dexamethasone  has been weaned off. Thyroid  biopsy pathology confirms papillary carcinoma.   Patient seen by medical oncology.  They have placed referral to ENT and endocrinology.  Radiation oncology referral was placed by neurosurgery.  All of this will be accomplished in the outpatient setting. Seen by therapy and they recommend inpatient rehabilitation. Outpatient follow-up with neurosurgery. Palliative care is following for pain management.  Patient noted to be on scheduled Tylenol , OxyContin  90 mg every 8 hours.  Also on as needed IV hydromorphone  and oxycodone .  Continue with bowel regimen.  He did have a large bowel movement this morning.   Right upper lobe segmental PE Pulmonary artery hypertension Anticoagulation was on hold due to surgery.   Subsequently cleared by neurosurgery to initiate anticoagulation.  Patient was started on IV heparin  which he tolerated well.  Patient was transitioned to apixaban  on 11/22.  Somewhat of a challenging situation since platelet counts have not been consistently available on CBC.  Clumps have been noted.  Platelet count of 122,000 noted this morning. No bleeding has been noted.  Hemoglobin is stable.  Continue apixaban .  No need to check labs daily now.   Fever/leukocytosis Low-grade fever was noted a few days ago.  Low-grade fever was thought to be multifactorial.  Could be due to his pulmonary embolism.  Steroids also contributing to leukocytosis.  Steroids have been discontinued. UA was unremarkable.  Chest x-ray did not show any pneumonia. WBC trending downwards.  Fever has subsided.   Hypomagnesemia Continue to supplement.    Acute blood loss anemia As noted above, EBL 1500 mL during neurosurgical procedure 11/13 and got 2 units PRBC.  Hemoglobin stable for the last several days.   Thrombocytopenia Platelet clumping on daily serial CBCs and counts not known.   Right shoulder pain, chronic Pain predates his current surgery.  Denies any worsening since hospital admission.  Communicated with Orthopedics PA 11/17 and recommends outpatient follow-up for suspected rotator cuff pathology.  PT evaluation.   Constipation Continue bowel regimen.  Patient did have a bowel movement on 11/18.     Class III obesity Estimated body mass index is 46.72 kg/m as calculated from the following:   Height as of this encounter: 5' 9 (1.753 m).   Weight as of this encounter: 143.5 kg.  Patient stable.  Accepted to inpatient rehab today.  Okay for discharge.  PERTINENT LABS:  The results of significant diagnostics from this hospitalization (including imaging, microbiology, ancillary and laboratory) are listed below for reference.     Labs:   Basic Metabolic Panel: Recent Labs  Lab 05/11/24 1324 05/12/24 0609 05/14/24 0745 05/16/24 0426 05/17/24 0528  NA 133* 134* 134* 133* 133*  K 4.0 3.8 3.6 4.3 4.4  CL 97* 100 100 98 98  CO2 23 24 24 27 26   GLUCOSE 158* 132* 121* 91 117*  BUN 15 14 14 18 14   CREATININE 0.74 0.84 0.68 0.61 0.76  CALCIUM 8.1* 8.3* 7.8* 8.5* 8.4*  MG  --   --  1.5* 1.7  --   PHOS  --   --  3.7 4.1  --    Liver Function Tests: Recent Labs  Lab 05/16/24 0426 05/17/24 0528  AST 18 18  ALT 33 34  ALKPHOS 69 79  BILITOT 0.4 0.4  PROT 6.4* 6.3*  ALBUMIN  2.4* 2.3*    CBC: Recent Labs  Lab 05/13/24 0500 05/14/24 0745 05/15/24 0535 05/16/24 0426 05/17/24 0749  WBC 20.0* 14.9* 11.5* 10.8* 9.9  NEUTROABS  --   --   --   --  7.1  HGB 10.1* 9.8* 9.8* 9.9* 9.6*  HCT 31.3* 30.8* 30.8* 31.5* 30.0*  MCV 93.4 93.3 92.8 94.0 92.6  PLT PLATELET CLUMPS NOTED ON SMEAR, UNABLE TO ESTIMATE  PLATELET CLUMPS NOTED ON SMEAR, UNABLE TO ESTIMATE PLATELET CLUMPS NOTED ON SMEAR, UNABLE TO ESTIMATE 122* PLATELET CLUMPS NOTED ON SMEAR, UNABLE TO ESTIMATE      DISCHARGE EXAMINATION: See progress note from earlier today  DISPOSITION: Inpatient rehabilitation   Current Inpatient Medications: Scheduled:  acetaminophen   1,000 mg Oral Q8H   apixaban   10 mg Oral BID   Followed by   NOREEN ON 05/21/2024] apixaban   5 mg Oral BID   Chlorhexidine  Gluconate Cloth  6 each Topical Daily   cyclobenzaprine   10 mg Oral TID   methocarbamol   500 mg Oral TID   multivitamin with minerals  1 tablet Oral Daily   oxyCODONE   90 mg Oral Q8H   polyethylene glycol  17 g Oral BID   Ensure Max Protein  11 oz Oral BID   senna-docusate  2 tablet Oral BID   sodium chloride  flush  10-40 mL Intracatheter Q12H   sodium chloride  flush  3 mL Intravenous Q12H     PRN:bisacodyl , HYDROmorphone  (DILAUDID ) injection, ondansetron  **OR** ondansetron  (ZOFRAN ) IV, oxyCODONE , sodium chloride  flush, sodium phosphate   Follow-up Information     Darnella Dorn SAUNDERS, MD Follow up.   Specialty: Neurosurgery Why: Please call our office to set up a 3 week wound check in Dr. Shelbie office. thank you Contact information: [address] [place] [address] [phone]                 TOTAL DISCHARGE TIME: 35 minutes  Amalee Olsen Verdene  [facility] Pager on [email or web address]  05/17/2024, 10:40 AM

## 2024-05-16 NOTE — Progress Notes (Signed)
 Inpatient Rehab Admissions Coordinator:    I have insurance approval and a bed available for pt to admit to CIR today. Dr. Verdene in agreement and [facility] aware.  I spoke to pt at bedside and updated father on the phone.  Plan for about a month on rehab, with goals of 24/7 physical assist at w/c level.  Pt will stay at his aunt's home (address in chart) with her as 24/7 caregiver and numerous other family members to provide additional support to help reduce caregiver burden.  We discussed RC pain currently limiting and pt encouraged to discuss with PMR MD.  We discussed pt/family working on disability for long term resources but reiterated these would not be in place for discharge from rehab.  Discussed with pt that with insurance potential barrier for [facility] and will potentially need to f/u outpatient.    Reche Lowers, PT, DPT Admissions Coordinator [phone] 05/16/24 12:10 PM
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
## 2024-05-16 NOTE — Progress Notes (Addendum)
 PROGRESS NOTE   Joseph [name]  FMW:[id number]    DOB: Sep 12, 1980    DOA: 04/27/2024  PCP: Joseph [name], No Pcp Per    Brief Hospital Course:  43 year old gentleman with no significant medical history presented to the hospital with about 4 days of back pain and difficulty walking, he was having some back pain issues for about 2 weeks.  CT scan of the lumbar spine was unrevealing.  CTA chest PE protocol showed right upper lobe segmental PE. He was started on Eliquis . Joseph [name] continued to have difficulty with mobility.  Multiple investigations were done.  MRI of the thoracic spine ultimately showed pathological T2 compression fracture. Thyroid  ultrasound showed large left thyroid  nodule.  He was transferred to [name] from [name] for Neurosurgery.  11/12, IR performed US  guided left lobe thyroid  nodule biopsy.  11/13: S/p posterior T1-3 instrumentation fusion T2 corpectomy for resection of tumor.  Thyroid  biopsy showed papillary carcinoma, oncology consulted and seen on 11/14 and plan to follow-up this week.   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  11/13: S/p posterior T1-3 instrumentation fusion T2 corpectomy for resection of tumor.  Extensive procedure.  Hemorrhagic tumor.  1500 mL EBL.  Got 2 units of PRBC across surgery.   JP drain removed.  Wound VAC was removed.  Prevena suction machine was applied on 11/20.  This is to stay in place until 11/27.   Dexamethasone  has been weaned off. Thyroid  biopsy pathology confirms papillary carcinoma.   Joseph [name] seen by medical oncology.  They have placed referral to ENT and endocrinology.  Radiation oncology referral was placed by neurosurgery.  All of this will be  accomplished in the outpatient setting. Seen by therapy and they recommend inpatient rehabilitation. Outpatient follow-up with neurosurgery. Palliative care is following for pain management.  Joseph [name] noted to be on scheduled Tylenol , OxyContin  90 mg every 8 hours.  Also on as needed IV hydromorphone  and oxycodone .  Continue with bowel regimen.  He did have a large bowel movement this morning.  Right upper lobe segmental PE Pulmonary artery hypertension Anticoagulation was on hold due to surgery.   Subsequently cleared by neurosurgery to initiate anticoagulation.  Joseph [name] was started on IV heparin  which he tolerated well.  Joseph [name] was transitioned to apixaban  on 11/22.  Somewhat of a challenging situation since platelet counts have not been consistently available on CBC.  Clumps have been noted.  Platelet count of 122,000 noted this morning. No bleeding has been noted.  Hemoglobin is stable.  Continue apixaban .  No need to check labs daily now.  Fever/leukocytosis Low-grade fever was noted a few days ago.  Low-grade fever was thought to be multifactorial.  Could be due to his pulmonary embolism.  Steroids also contributing to leukocytosis.  Steroids have been discontinued. UA was unremarkable.  Chest x-ray did not show any pneumonia. WBC trending downwards.  Fever has subsided.  Hypomagnesemia Continue to supplement.  Acute blood loss anemia As noted above, EBL 1500 mL during neurosurgical procedure 11/13 and got 2 units PRBC.  Hemoglobin stable for the last several days.  Thrombocytopenia Platelet clumping on daily serial CBCs and counts not known.  Right shoulder pain, chronic Pain predates his current surgery.  Denies any worsening since hospital admission.  Communicated with Orthopedics PA 11/17 and recommends outpatient  follow-up for suspected rotator cuff pathology.  PT evaluation.  Constipation Continue bowel regimen.  Joseph [name] did have a bowel movement on 11/18.    Informed by  nursing staff that the PICC line is not functioning well.  Joseph [name] not noted to be on scheduled IV medications.  Most of his medications are to be administered orally.  Will discontinue PICC line and have a IV team place a peripheral access.  Body mass index is 46.72 kg/m./Very morbid obesity class III Complicates care.  Outpatient follow-up.   DVT prophylaxis: Subcutaneous heparin    Code Status: Full Code:  Family Communication: None at bedside.   Disposition: Medically stable to go to inpatient rehabilitation.    Consultants:   Neurosurgery Neurology Interventional radiology Medical oncology  Procedures:   As above Difficult LUE PICC line placement by PICC team on 11/15.  Subjective:  He is well.  Pain is 6 out of 10 in intensity.  No other complaints offered.  Had a bowel movement this morning.  Objective:   Vitals:   05/16/24 0333 05/16/24 0744 05/16/24 0749 05/16/24 0753  BP: (!) 149/64   (!) 155/74  Pulse: 96   100  Resp: 16   10  Temp: 98.8 F (37.1 C)  98.8 F (37.1 C) 98.8 F (37.1 C)  TempSrc: Oral Oral Oral Oral  SpO2: 96%   94%  Weight:      Height:       General appearance: Awake alert.  In no distress Resp: Clear to auscultation bilaterally.  Normal effort Cardio: S1-S2 is normal regular.  No S3-S4.  No rubs murmurs or bruit GI: Abdomen is soft.  Nontender nondistended.  Bowel sounds are present normal.  No masses organomegaly    Data Reviewed:     CBC: Recent Labs  Lab 05/14/24 0745 05/15/24 0535 05/16/24 0426  WBC 14.9* 11.5* 10.8*  HGB 9.8* 9.8* 9.9*  HCT 30.8* 30.8* 31.5*  MCV 93.3 92.8 94.0  PLT PLATELET CLUMPS NOTED ON SMEAR, UNABLE TO ESTIMATE PLATELET CLUMPS NOTED ON SMEAR, UNABLE TO ESTIMATE 122*    Basic Metabolic Panel: Recent Labs  Lab 05/11/24 1324 05/12/24 0609 05/14/24 0745 05/16/24 0426  NA 133* 134* 134* 133*  K 4.0 3.8 3.6 4.3  CL 97* 100 100 98  CO2 23 24 24 27   GLUCOSE 158* 132* 121* 91  BUN 15 14 14 18    CREATININE 0.74 0.84 0.68 0.61  CALCIUM 8.1* 8.3* 7.8* 8.5*  MG  --   --  1.5* 1.7  PHOS  --   --  3.7 4.1      Microbiology Studies:   No results found for this or any previous visit (from the past 240 hours).   Radiology Studies:  No results found.    Scheduled Meds:    acetaminophen   1,000 mg Oral Q8H   apixaban   10 mg Oral BID   Followed by   NOREEN ON 05/21/2024] apixaban   5 mg Oral BID   Chlorhexidine  Gluconate Cloth  6 each Topical Daily   cyclobenzaprine   10 mg Oral TID   methocarbamol   500 mg Oral TID   multivitamin with minerals  1 tablet Oral Daily   oxyCODONE   90 mg Oral Q8H   polyethylene glycol  17 g Oral BID   Ensure Max Protein  11 oz Oral BID   senna-docusate  2 tablet Oral BID   sodium chloride  flush  10-40 mL Intracatheter Q12H   sodium chloride  flush  3 mL Intravenous Q12H  Continuous Infusions:      LOS: 14 days   Joette Pebbles, MD,    To contact the attending provider between 7A-7P or the covering provider during after hours 7P-7A, please log into the web site [email or web address] and access using universal [place] password for that web site. If you do not have the password, please call the hospital operator.  05/16/2024, 9:40 AM

## 2024-05-16 NOTE — Progress Notes (Signed)
 Occupational Therapy Treatment Patient Details Name: GUS LITTLER MRN: [id number] DOB: 08-Apr-1981 Today's Date: 05/16/2024   History of present illness Dixon Luczak is a 43 yo male who presented with 4 days of back pain and difficulty walking.  CTA chest PE protocol showed right upper lobe segmental PE. MRI of the thoracic spine ultimately showed pathological T2 compression fracture. Thyroid  ultrasound showed large left thyroid  nodule. 11/12, IR performed US  guided left lobe thyroid  nodule biopsy.  11/13: S/p posterior T1-3 instrumentation fusion T2 corpectomy for resection of tumor.  Thyroid  biopsy showed papillary carcinoma. PMHx: none.   OT comments  Pt is making steady progress towards their acute OT goals. He continues to benefit from co-treats to safely progress towards OOB goals. Pt needed mod A for bed mobility and max A +2 for standing and lateral transfers. He stood fro ~10 seconds this date but was unable to progress to pivoting. Pt left in chair with pillows supporting RUE, he is significantly pain limited but encouraged ROM of elbow wrist and hand while out of the sling. OT to continue to follow acutely to facilitate progress towards established goals. Pt will continue to benefit from intensive inpatient follow up therapy, >3 hours/day after discharge.         If plan is discharge home, recommend the following:  A lot of help with walking and/or transfers;Two people to help with walking and/or transfers;A lot of help with bathing/dressing/bathroom;Two people to help with bathing/dressing/bathroom   Equipment Recommendations  Other (comment)    Recommendations for Other Services Rehab consult    Precautions / Restrictions Precautions Precautions: Fall;Back Precaution Booklet Issued: No Recall of Precautions/Restrictions: Intact Precaution/Restrictions Comments: painful R shoulder, present prior to admission but pt states worse now Required Braces or Orthoses:  Sling Restrictions Weight Bearing Restrictions Per Provider Order: Yes Other Position/Activity Restrictions: Dr. Darnella via secure chat 11/17 - WBAT all extremities. NWB through RUE due to pain.       Mobility Bed Mobility Overal bed mobility: Needs Assistance Bed Mobility: Supine to Sit     Supine to sit: Mod assist     General bed mobility comments: +1 assist for bed mobility with date, improved tolerance    Transfers Overall transfer level: Needs assistance Equipment used: 2 person hand held assist Transfers: Sit to/from Stand, Bed to chair/wheelchair/BSC Sit to Stand: Max assist, +2 physical assistance, +2 safety/equipment          Lateral/Scoot Transfers: Max assist, +2 safety/equipment, +2 physical assistance General transfer comment: 3x STS attempts with max A +2, he was able to obtain full upright standing 1x. Straight back chair placed in front of patient to offer LUE support in standing. Unable to offload his feet to progress to a pivot. Lateral scoot from bed to chair with max A +2, pt demonstrated great carryover of technique from last session     Balance Overall balance assessment: History of Falls, Needs assistance Sitting-balance support: Feet supported Sitting balance-Leahy Scale: Fair     Standing balance support: Bilateral upper extremity supported, During functional activity, Reliant on assistive device for balance Standing balance-Leahy Scale: Poor Standing balance comment: tolerated standing for ~10 seconds                           ADL either performed or assessed with clinical judgement   ADL Overall ADL's : Needs assistance/impaired     Grooming: Set up;Sitting  Toilet Transfer: Maximal assistance;+2 for physical assistance;+2 for safety/equipment Toilet Transfer Details (indicate cue type and reason): attempted standing, squat pivoting and lateral scoots to progress towards OOB toileting          Functional mobility during ADLs: Maximal assistance;+2 for physical assistance;+2 for safety/equipment General ADL Comments: continues to be limited by significant pain and weakness    Extremity/Trunk Assessment Upper Extremity Assessment Upper Extremity Assessment: RUE deficits/detail RUE Deficits / Details: significant pain, maintained NWB and no ROM due to pain. Left pt out of the sling at the end of the session to encourage elbow, wrist and hand ROM RUE Sensation: WNL RUE Coordination: decreased gross motor   Lower Extremity Assessment Lower Extremity Assessment: Defer to PT evaluation        Vision       Perception Perception Perception: Not tested   Praxis Praxis Praxis: Not tested   Communication Communication Communication: No apparent difficulties   Cognition Arousal: Alert Behavior During Therapy: WFL for tasks assessed/performed, Anxious Cognition: No apparent impairments                               Following commands: Intact        Cueing   Cueing Techniques: Verbal cues  Exercises Exercises: Other exercises Other Exercises Other Exercises: pt not tolerating any movement of the RUE, encouraged elbow wrist and hand movement once his pain settles down    Shoulder Instructions       General Comments VSS    Pertinent Vitals/ Pain       Pain Assessment Pain Assessment: Faces Pain Score: 8  Pain Location: R shoulder, back Pain Descriptors / Indicators: Constant, Discomfort, Grimacing, Guarding Pain Intervention(s): Limited activity within patient's tolerance, Monitored during session  Home Living                                          Prior Functioning/Environment              Frequency  Min 2X/week        Progress Toward Goals  OT Goals(current goals can now be found in the care plan section)  Progress towards OT goals: Progressing toward goals  Acute Rehab OT Goals Patient Stated Goal: less  pain OT Goal Formulation: With patient Time For Goal Achievement: 05/23/24 Potential to Achieve Goals: Good ADL Goals Pt Will Perform Grooming: with set-up;sitting Pt Will Perform Upper Body Bathing: with min assist;sitting Pt Will Transfer to Toilet: stand pivot transfer;bedside commode;with min assist;with +2 assist Pt Will Perform Toileting - Clothing Manipulation and hygiene: with mod assist;sitting/lateral leans;sit to/from stand Additional ADL Goal #1: Pt to complete bed mobility with Min A in prep for EOB/OOB ADLs  Plan      Co-evaluation    PT/OT/SLP Co-Evaluation/Treatment: Yes Reason for Co-Treatment: Complexity of the patient's impairments (multi-system involvement);To address functional/ADL transfers;For patient/therapist safety   OT goals addressed during session: ADL's and self-care      AM-PAC OT 6 Clicks Daily Activity     Outcome Measure   Help from another person eating meals?: None Help from another person taking care of personal grooming?: A Little Help from another person toileting, which includes using toliet, bedpan, or urinal?: Total Help from another person bathing (including washing, rinsing, drying)?: A Lot Help from another person to put on  and taking off regular upper body clothing?: A Lot Help from another person to put on and taking off regular lower body clothing?: Total 6 Click Score: 13    End of Session Equipment Utilized During Treatment: Other (comment)  OT Visit Diagnosis: Unsteadiness on feet (R26.81);Other abnormalities of gait and mobility (R26.89);Muscle weakness (generalized) (M62.81);History of falling (Z91.81);Pain Pain - Right/Left: Right Pain - part of body: Shoulder   Activity Tolerance Patient limited by pain   Patient Left in chair;with call bell/phone within reach   Nurse Communication Mobility status        Time: 1029-1100 OT Time Calculation (min): 31 min  Charges: OT General Charges $OT Visit: 1 Visit OT  Treatments $Therapeutic Activity: 8-22 mins  Lucie Kendall, OTR/L Acute Rehabilitation Services Office [phone] Secure Chat Communication Preferred   Lucie JONETTA Kendall 05/16/2024, 11:09 AM

## 2024-05-16 NOTE — Progress Notes (Signed)
 Order to discharge patient to CIR 4W-08 and Report given to [facility]. Personal belongings sent with the patient.

## 2024-05-17 DIAGNOSIS — G8222 Paraplegia, incomplete: Principal | ICD-10-CM | POA: Diagnosis present

## 2024-05-17 LAB — CBC WITH DIFFERENTIAL/PLATELET
Abs Immature Granulocytes: 0.21 K/uL — ABNORMAL HIGH (ref 0.00–0.07)
Basophils Absolute: 0.1 K/uL (ref 0.0–0.1)
Basophils Relative: 1 %
Eosinophils Absolute: 0.3 K/uL (ref 0.0–0.5)
Eosinophils Relative: 3 %
HCT: 30 % — ABNORMAL LOW (ref 39.0–52.0)
Hemoglobin: 9.6 g/dL — ABNORMAL LOW (ref 13.0–17.0)
Immature Granulocytes: 2 %
Lymphocytes Relative: 15 %
Lymphs Abs: 1.5 K/uL (ref 0.7–4.0)
MCH: 29.6 pg (ref 26.0–34.0)
MCHC: 32 g/dL (ref 30.0–36.0)
MCV: 92.6 fL (ref 80.0–100.0)
Monocytes Absolute: 0.7 K/uL (ref 0.1–1.0)
Monocytes Relative: 7 %
Neutro Abs: 7.1 K/uL (ref 1.7–7.7)
Neutrophils Relative %: 72 %
Platelets: UNDETERMINED K/uL (ref 150–400)
RBC: 3.24 MIL/uL — ABNORMAL LOW (ref 4.22–5.81)
RDW: 13.6 % (ref 11.5–15.5)
WBC: 9.9 K/uL (ref 4.0–10.5)
nRBC: 0 % (ref 0.0–0.2)

## 2024-05-17 LAB — COMPREHENSIVE METABOLIC PANEL WITH GFR
ALT: 34 U/L (ref 0–44)
AST: 18 U/L (ref 15–41)
Albumin: 2.3 g/dL — ABNORMAL LOW (ref 3.5–5.0)
Alkaline Phosphatase: 79 U/L (ref 38–126)
Anion gap: 9 (ref 5–15)
BUN: 14 mg/dL (ref 6–20)
CO2: 26 mmol/L (ref 22–32)
Calcium: 8.4 mg/dL — ABNORMAL LOW (ref 8.9–10.3)
Chloride: 98 mmol/L (ref 98–111)
Creatinine, Ser: 0.76 mg/dL (ref 0.61–1.24)
GFR, Estimated: >60 mL/min (ref >60)
Glucose, Bld: 117 mg/dL — ABNORMAL HIGH (ref 70–99)
Potassium: 4.4 mmol/L (ref 3.5–5.1)
Sodium: 133 mmol/L — ABNORMAL LOW (ref 135–145)
Total Bilirubin: 0.4 mg/dL (ref 0.0–1.2)
Total Protein: 6.3 g/dL — ABNORMAL LOW (ref 6.5–8.1)

## 2024-05-17 MED ORDER — LIDOCAINE HCL 1 % IJ SOLN
10.0000 mL | Freq: Once | INTRAMUSCULAR | Status: AC
  Administered 2024-05-17: 10 mL
  Filled 2024-05-17: qty 10

## 2024-05-17 MED ORDER — OXYCODONE HCL ER 10 MG PO T12A
100.0000 mg | EXTENDED_RELEASE_TABLET | Freq: Three times a day (TID) | ORAL | Status: DC
  Administered 2024-05-17 – 2024-05-18 (×5): 100 mg via ORAL
  Filled 2024-05-17 (×5): qty 1

## 2024-05-17 MED ORDER — METHOCARBAMOL 750 MG PO TABS
750.0000 mg | ORAL_TABLET | Freq: Four times a day (QID) | ORAL | Status: DC
  Administered 2024-05-17 – 2024-05-20 (×14): 750 mg via ORAL
  Filled 2024-05-17 (×14): qty 1

## 2024-05-17 MED ORDER — PREGABALIN 50 MG PO CAPS
50.0000 mg | ORAL_CAPSULE | Freq: Three times a day (TID) | ORAL | Status: DC
  Administered 2024-05-17 – 2024-05-20 (×12): 50 mg via ORAL
  Filled 2024-05-17 (×12): qty 1
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
## 2024-05-17 NOTE — Progress Notes (Signed)
 Occupational Therapy Session Note  Patient Details  Name: Joseph [name] MRN: [id number] Date of Birth: 02-06-1981  Today's Date: 05/17/2024 OT Individual Time: 1135-1200 OT Individual Time Calculation (min): 25 min    Short Term Goals: Week 1:  OT Short Term Goal 1 (Week 1): Pt will improve sit <> stands in stedy with CGA OT Short Term Goal 2 (Week 1): Pt will perform toileting in most appropriate environment with MAX A OT Short Term Goal 3 (Week 1): Pt will improve sitting balance to sit EOB for 5 mins while performing ADL of choice  Skilled Therapeutic Interventions/Progress Updates:  Pt resting in w/c upon arrival. See pain below. Skilled OT intervention with focus on review of LTG, home set up, DME recommendations, and discharge plans. Pt remained in TIS w/c with all needs within reach.  Therapy Documentation Precautions:  Precautions Precautions: Fall, Back Recall of Precautions/Restrictions: Intact Precaution/Restrictions Comments: painful R shoulder, present prior to admission but pt states worse now Required Braces or Orthoses: Sling Restrictions Weight Bearing Restrictions Per Provider Order: Yes RUE Weight Bearing Per Provider Order: Non weight bearing Other Position/Activity Restrictions: Dr. Darnella via secure chat 11/17 - WBAT all extremities. NWB through RUE due to pain. Pain: Pt reports 9/10 Rt shoulder pain; repositioned   Therapy/Group: Individual Therapy  [name] 05/17/2024, 3:05 PM

## 2024-05-17 NOTE — Progress Notes (Signed)
 Inpatient Rehabilitation  Patient information reviewed and entered into eRehab system by [facility]. Karen Kays., CCC/SLP, PPS Coordinator.  Information including medical coding, functional ability and quality indicators will be reviewed and updated through discharge.

## 2024-05-17 NOTE — Progress Notes (Signed)
 Patient ID: Joseph [name], male   DOB: 1980-08-12, 43 y.o.   MRN: [id number]  SW went by pt room to complete assessment, but pt meting with attending and nursing. SW will follow-up to complete.   Graeme Jude, MSW, LCSW Office: [phone] Cell: [phone] Fax: [phone]

## 2024-05-17 NOTE — Progress Notes (Signed)
 Physical Therapy Session Note  Patient Details  Name: Joseph [name] MRN: [id number] Date of Birth: September 06, 1980  Today's Date: 05/17/2024 PT Individual Time: 1445-1530 PT Individual Time Calculation (min): 45 min   Short Term Goals: Week 1:  PT Short Term Goal 1 (Week 1): pt will perform STS with assist of 1 using stedy or LRAD PT Short Term Goal 2 (Week 1): Pt will perform bed mobiltiy with assist of 1 PT Short Term Goal 3 (Week 1): Pt will initiate transfer training with RW, transfer board, or squat pivot  Skilled Therapeutic Interventions/Progress Updates:    Pt seated in w/c on arrival and agreeable to therapy. Pt with up to 8/10 pain during session, but able to work through pain at this level, premedicated. Rest and positioning provided as needed. Discussed pt prognosis and options for transfers with assist at home. Discussed working toward highest level goal of Stand pivot transfer with RW, but pt agreeable to d/c with slideboard transfer or hoyer if needed pending assist levels at home to allow for fluctuating energy levels in regard to cancer treatments. Pt agreeable this. Also discussed private purchase of stedy as option for safe tranfers with low caregiver burden with fluctuating pt ability, pt agreeable.   Pt transported to therapy gym for time management and energy conservation. Session focused on standing in //bars to progress standing tolerance in hopes of future transfer training. Pt performed Sit to stand x 5 with max +2, therapist providing knee block throughout. Pt able to tolerate brief periods of standing, note L knee tending toward hyperextension, but pt able to notice and attempt to fix. Pt fatigues quickly from this and required extended rest breaks. Discussed energy conservation and maintaining enough reserve to complete therapy sessions each day, pt expressed understanding.  Pt returned to room and remained in TIS, was left with all needs in reach and alarm active.    Therapy Documentation Precautions:  Precautions Precautions: Fall, Back Recall of Precautions/Restrictions: Intact Precaution/Restrictions Comments: painful R shoulder, present prior to admission but pt states worse now Required Braces or Orthoses: Sling Restrictions Weight Bearing Restrictions Per Provider Order: Yes RUE Weight Bearing Per Provider Order: Non weight bearing RLE Weight Bearing Per Provider Order: Non weight bearing Other Position/Activity Restrictions: Dr. Darnella via secure chat 11/17 - WBAT all extremities. NWB through RUE due to pain. General:       Therapy/Group: Individual Therapy  Joseph [name] 05/17/2024, 4:34 PM

## 2024-05-17 NOTE — Progress Notes (Signed)
 PROGRESS NOTE   Subjective/Complaints:  Pt reports pain is awful still- there was a note I went through from palliative that were to increase his Oxycontin  to 100 mg TID- which was done.   Said woke up around 1 am and up til 3am due to pain that couldn't calm down. Severe muscle spasms, but not spasticity.  Rates pain 9/10 this AM Feels like he has control of B/B, but admits had bowel incontinence- loose stools. LBM yesterday.   Has sling on RUE  ROS: Per HPI   Pt denies SOB, abd pain, CP, N/V/C/D, and vision changes   Objective:   No results found. Recent Labs    05/16/24 0426 05/17/24 0749  WBC 10.8* 9.9  HGB 9.9* 9.6*  HCT 31.5* 30.0*  PLT 122* PLATELET CLUMPS NOTED ON SMEAR, UNABLE TO ESTIMATE   Recent Labs    05/16/24 0426 05/17/24 0528  NA 133* 133*  K 4.3 4.4  CL 98 98  CO2 27 26  GLUCOSE 91 117*  BUN 18 14  CREATININE 0.61 0.76  CALCIUM 8.5* 8.4*    Intake/Output Summary (Last 24 hours) at 05/17/2024 1752 Last data filed at 05/17/2024 1700 Gross per 24 hour  Intake 340 ml  Output 1025 ml  Net -685 ml        Physical Exam: Vital Signs Blood pressure 128/85, pulse 100, temperature 98.4 F (36.9 C), temperature source Oral, resp. rate 16, height 5' 9 (1.753 m), weight (!) 145 kg, SpO2 97%.    General: awake, alert, appropriate,  supine in bed; and then turned slightly to side; also seen later and up in w/c; NAD HENT: conjugate gaze; oropharynx dry CV: regular rate and rhythm- rate in 90's; no JVD Pulmonary: CTA B/L; no W/R/R- good air movement GI: soft, NT, ND, (+)BS Psychiatric: appropriate- flat Neurological: Ox3 MSK: severe trp's in neck/shoulders and upper back HF 2/5; KE 4-/5; DF and PF 4-/5 B/L- could be limited due to pain as well as weakness?  Musculoskeletal:     Cervical back: Tenderness present.     Comments: trp's in neck/sholders and upper back and pecs- Tenderness  along posterior and right neck, trap. Right shoulder with generalized tenderness to palpation and attempts at active/passive ER/IR/ABD  Skin:    Comments: Prevena vac in place on upper thoracic incision. No fluid in cannister.   Neurological:     Mental Status: He is alert.     Comments: Alert and oriented x 3. Normal insight and awareness. Intact Memory. Normal language and speech. Cranial nerve exam unremarkable. MMT: RUE limited by pain in right shoulder. LUE is 5/5 prox to distal. BLE: 4-/5 HF, 4/5 KE and 4+/5 ADF/PF. Sl sensory loss to LT below level of injury, but sensation is near normal. DTR's 1+. No abnl resting tone appreciated. .  Assessment/Plan: 1. Functional deficits which require 3+ hours per day of interdisciplinary therapy in a comprehensive inpatient rehab setting. Physiatrist is providing close team supervision and 24 hour management of active medical problems listed below. Physiatrist and rehab team continue to assess barriers to discharge/monitor patient progress toward functional and medical goals  Care Tool:  Bathing    Body  parts bathed by patient: Right arm, Chest, Abdomen, Face   Body parts bathed by helper: Front perineal area, Buttocks, Left arm, Right lower leg, Left lower leg, Right upper leg, Left upper leg     Bathing assist Assist Level: Total Assistance - Patient < 25%     Upper Body Dressing/Undressing Upper body dressing   What is the patient wearing?: Hospital gown only    Upper body assist Assist Level: Total Assistance - Patient < 25%    Lower Body Dressing/Undressing Lower body dressing      What is the patient wearing?: Incontinence brief, Pants     Lower body assist Assist for lower body dressing: 2 Helpers     Toileting Toileting    Toileting assist Assist for toileting: 2 Helpers     Transfers Chair/bed transfer  Transfers assist     Chair/bed transfer assist level: Dependent - mechanical lift      Locomotion Ambulation   Ambulation assist   Ambulation activity did not occur: Safety/medical concerns          Walk 10 feet activity   Assist  Walk 10 feet activity did not occur: Safety/medical concerns        Walk 50 feet activity   Assist Walk 50 feet with 2 turns activity did not occur: Safety/medical concerns         Walk 150 feet activity   Assist Walk 150 feet activity did not occur: Safety/medical concerns         Walk 10 feet on uneven surface  activity   Assist Walk 10 feet on uneven surfaces activity did not occur: Safety/medical concerns         Wheelchair     Assist Is the patient using a wheelchair?: Yes Type of Wheelchair: Manual    Wheelchair assist level: Dependent - Patient 0%      Wheelchair 50 feet with 2 turns activity    Assist        Assist Level: Dependent - Patient 0%   Wheelchair 150 feet activity     Assist      Assist Level: Dependent - Patient 0%   Blood pressure 128/85, pulse 100, temperature 98.4 F (36.9 C), temperature source Oral, resp. rate 16, height 5' 9 (1.753 m), weight (!) 145 kg, SpO2 97%.  Medical Problem List and Plan: 1. 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   To see Oncology 12/9 for starting Radiation? 2.  Antithrombotics/ RUL PE:             -continue eliquis . Monitor CBC             -Can use TEDS for swelling if needed             -antiplatelet therapy: n/a   3. Pain Management: Scheduled Tylenol , Flexeril , Robaxin  -palliative care recommended increasing oxyContin   to 100 mg q8.  -will stay at 90mg  q8 for now.  -  oxycodone  20mg  q4 prn  for breakthrough -Palliative care helping with pain management.    11/25- increased oxycontin  to 100 mg TID per palliative note; and increased robaxin  to 750 mg QID- per nursing, sleepy, but appeared to be able to participate in therapy  TRP injections today- pain down to 7/10 within 1 minute after injections- really helped per pt 4. Mood/Behavior/Sleep: LCSW to follow for evaluation and support when available.  -antipsychotic agents: N/A             -sleep:    5. Neuropsych/cognition: This patient is capable of making decisions on his own behalf.   6. Skin/Wound Care: Routine pressure relief measures. Monitor post op incision for signs of infection. -Prevena in place-remove 11/27  - Remove sutures 3 weeks postop-11/30.    7. Fluids/Electrolytes/Nutrition: Monitor I&O and weight. Follow up labs CBC/CMP     8.  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  Completed course of dexamethasone .  Patient to follow up outpatient with ENT, endocrinology and radiation oncology.    11.  Fever/Leukocytosis: Felt to be multifactorial and due to PE. Steroids course completed. Monitor CBC and vitals for fever on  Tylenol  1000 mg Q8h.    11/25- T 99.5 overnight, but WBC back down to normal range- con't to monitor 12. Hypomagnesemia: Mag 1.7, continue mag supplement.   13.  ABLA: Post procedure EBL 1500 mL, received 2 units PRBC.  Hemoglobin stable.   14.  Right shoulder pain, chronic: Per orthopedics PA follow-up outpatient for suspected rotator cuff pathology.   11/25- In sling for R shoulder pain-  14.  Constipation:  LBM 11/24.              -pt is having gas and is sensing when he needs to have bm             -has generally been continent             -stay aggressive with bowel regimen given the amount of opiates he's on.  -bid miralax  and senokot-s 2 tabs bid              -prn sorbitol , dulcolax   11/25- Bowels loose and incontinent- not clear if due  to loose stools or need for bowel program- will montor 15. Polysubstance Abuse: Continue discussion of cessation and risk factors.    11/25- will need to see Palliative care after d/c for pain meds- due to high doses-    -  16. Neurogenic Bladder:             -pt has been emptying bladder, typically continent             - check a few PVR's   11/25- bladder scan 52 overnight- don't see any from today- will f/u with nursing   Patient here for trigger point injections for  Consent done and on chart.  Cleaned areas with alcohol and injected using a 27 gauge 1.5 inch needle  Injected 3cc none wasted Using 1% Lidocaine  with no EPI  Upper traps B/L x2- a lot of twitches Levators B/L  Posterior scalenes Middle scalenes- B/L - a lot of twitch responses Splenius Capitus- more laterally- B/L Pectoralis Major Rhomboids Infraspinatus Teres Major/minor Thoracic paraspinals Lumbar paraspinals Other injections-    Patient's level of pain prior was 9-10/10 Current level of pain after injections is 7/10 within 1 minute doing better  There was no bleeding or complications.  Patient was advised to drink a lot of water on day after  injections to flush system Will have increased soreness for 12-48 hours after injections.  Can use Lidocaine  patches the day AFTER injections Can use theracane on day of injections in places didn't inject Can use heating pad 4-6 hours AFTER injections    I spent a total of 56   minutes on total care today- >50% coordination of care- due to  D/w pt at length about pain and trying to get control of Sx's- trp injections, team conference done; and d/w nursing multiple times    LOS: 1 days A FACE TO FACE EVALUATION WAS PERFORMED  Dagmawi Venable 05/17/2024, 5:52 PM

## 2024-05-17 NOTE — Patient Care Conference (Signed)
 Inpatient RehabilitationTeam Conference and Plan of Care Update Date: 05/17/2024   Time:  1117 am   Patient Name: Joseph [name]      Medical Record Number: [id number]  Date of Birth: May 31, 1981 Sex: Male         Room/Bed: 4W08C/4W08C-01 Payor Info: Payor: [employer] / Plan: [employer] / Product Type: *No Product type* /    Admit Date/Time:  05/16/2024  3:19 PM  Primary Diagnosis:  Pathologic fracture of thoracic vertebrae  Hospital Problems: Principal Problem:   Pathologic fracture of thoracic vertebrae    Expected Discharge Date: Expected Discharge Date:  (TBD)  Team Members Present: Physician leading conference: Dr. Duwaine Barrs Social Worker Present: Graeme Jude, LCSW Nurse Present: Eulalio Falls, RN PT Present: Schuyler Batter, PT OT Present: Charlena Cha, Darrol Hoe, OT     Current Status/Progress Goal Weekly Team Focus  Bowel/Bladder   Cont x2, Last BM 11/23   Remain continent x2   Assess bowel and bladder needs Q4 hours and PRN    Swallow/Nutrition/ Hydration               ADL's   total A/2 assist for LB ADL, bed level LB dressing, pain significantly limiting, nonuse of RUE, stedy 2+ transfers   MOD A overall   movements within pain tolerance, OOB activity, LB ADLs    Mobility   +2  STS to stedy, limited eval   mod I w/c, potential for reccomending stedy/hoyer d/t poor cancer prognosis  pain management, transfer training, family ed    Communication                Safety/Cognition/ Behavioral Observations               Pain   Pain remains relatively high with scheduled pain meds and PRN, aquatherma applied   To have a decrease in pain   Pain control    Skin   Incision to back with wound vac  Remove suture 05/22/24 To maintain wound vac closure  Assess status/function of wound vac      Discharge Planning:  TBA    Team Discussion: Patient was admitted post pathological T2 fx with myelopathy  d/t metastatic thyroid  cancer.Patient post T1-3 laminectomy. Patient T2 incomplete paraplegia- ASIA D .  Patient with severe pain right upper arm: medications adjusted by MD and palliative care helping with pain management.  Patient on target to meet rehab goals: Patient needs total assistance for ADLs. Patient needs stedy +2 with transfers. Overall goals at discharge are set for mod assistance.   *See Care Plan and progress notes for long and short-term goals.   Revisions to Treatment Plan:  Sling right upper arm TRP injections by MD Increase fluid intake Air mattress Wheelchair evaluation Stedy  Wound vac  Teaching Needs: Safety, medications, transfers, toileting, encourage fluid intake, etc   Current Barriers to Discharge: Decreased caregiver support, Home enviroment access/layout, and Incontinence, neurogenic bowel and bladder, weight bearing restrictions.   Possible Resolutions to Barriers: Family education     Medical Summary Current Status: thyroid  CA- with mets- to spine T2 incomplete paraplegia- ASIA D;  Barriers to Discharge: Automomic dysreflexia;Complicated Wound;Self-care education;Pending chemo/radiation;Neurogenic Bowel & Bladder;Incontinence;Medical stability;Uncontrolled Pain;Weight bearing restrictions  Barriers to Discharge Comments: limited by cancer/radiation- out of ocntrol pain-  on oxycontin  100 mg TID- and Oxycodone  high doses- Possible Resolutions to [employer]: super limited by pain- trigger point injections today-  needs w/c evaluation- manual w/c- TBD  Continued Need for Acute Rehabilitation Level of Care: The patient requires daily medical management by a physician with specialized training in physical medicine and rehabilitation for the following reasons: Direction of a multidisciplinary physical rehabilitation program to maximize functional independence : Yes Medical management of patient stability for increased activity during  participation in an intensive rehabilitation regime.: Yes Analysis of laboratory values and/or radiology reports with any subsequent need for medication adjustment and/or medical intervention. : Yes   I attest that I was present, lead the team conference, and concur with the assessment and plan of the team.   Joseph [name] 05/17/2024, 1117 am

## 2024-05-17 NOTE — Plan of Care (Signed)
  Problem: RH Balance Goal: LTG: Patient will maintain dynamic sitting balance (OT) Description: LTG:  Patient will maintain dynamic sitting balance with assistance during activities of daily living (OT) Flowsheets (Taken 05/17/2024 1253) LTG: Pt will maintain dynamic sitting balance during ADLs with: Supervision/Verbal cueing   Problem: Sit to Stand Goal: LTG:  Patient will perform sit to stand in prep for activites of daily living with assistance level (OT) Description: LTG:  Patient will perform sit to stand in prep for activites of daily living with assistance level (OT) Flowsheets (Taken 05/17/2024 1253) LTG: PT will perform sit to stand in prep for activites of daily living with assistance level: Moderate Assistance - Patient 50 - 74% Note: In stedy   Problem: RH Eating Goal: LTG Patient will perform eating w/assist, cues/equip (OT) Description: LTG: Patient will perform eating with assist, with/without cues using equipment (OT) Flowsheets (Taken 05/17/2024 1253) LTG: Pt will perform eating with assistance level of: Independent with assistive device    Problem: RH Grooming Goal: LTG Patient will perform grooming w/assist,cues/equip (OT) Description: LTG: Patient will perform grooming with assist, with/without cues using equipment (OT) Flowsheets (Taken 05/17/2024 1253) LTG: Pt will perform grooming with assistance level of: Set up assist    Problem: RH Bathing Goal: LTG Patient will bathe all body parts with assist levels (OT) Description: LTG: Patient will bathe all body parts with assist levels (OT) Flowsheets (Taken 05/17/2024 1253) LTG: Pt will perform bathing with assistance level/cueing: Moderate Assistance - Patient 50 - 74%   Problem: RH Dressing Goal: LTG Patient will perform upper body dressing (OT) Description: LTG Patient will perform upper body dressing with assist, with/without cues (OT). Flowsheets (Taken 05/17/2024 1253) LTG: Pt will perform upper body  dressing with assistance level of: Moderate Assistance - Patient 50 - 74% Goal: LTG Patient will perform lower body dressing w/assist (OT) Description: LTG: Patient will perform lower body dressing with assist, with/without cues in positioning using equipment (OT) Flowsheets (Taken 05/17/2024 1253) LTG: Pt will perform lower body dressing with assistance level of: Moderate Assistance - Patient 50 - 74%   Problem: RH Toileting Goal: LTG Patient will perform toileting task (3/3 steps) with assistance level (OT) Description: LTG: Patient will perform toileting task (3/3 steps) with assistance level (OT)  Flowsheets (Taken 05/17/2024 1253) LTG: Pt will perform toileting task (3/3 steps) with assistance level: Maximal Assistance - Patient 25 - 49%   Problem: RH Toilet Transfers Goal: LTG Patient will perform toilet transfers w/assist (OT) Description: LTG: Patient will perform toilet transfers with assist, with/without cues using equipment (OT) Flowsheets (Taken 05/17/2024 1253) LTG: Pt will perform toilet transfers with assistance level of: Maximal Assistance - Patient 25 - 49%

## 2024-05-17 NOTE — Discharge Instructions (Signed)
 Information on my medicine - ELIQUIS  (apixaban )  This medication education was reviewed with me or my healthcare representative as part of my discharge preparation.    Why was Eliquis  prescribed for you? Eliquis  was prescribed to treat blood clots that may have been found in the veins of your legs (deep vein thrombosis) or in your lungs (pulmonary embolism) and to reduce the risk of them occurring again.  What do You need to know about Eliquis  ? The starting dose is 10 mg (two 5 mg tablets) taken TWICE daily for the FIRST SEVEN (7) DAYS, then on May 21, 2024 the dose is reduced to ONE 5 mg tablet taken TWICE daily.  Eliquis  may be taken with or without food.   Try to take the dose about the same time in the morning and in the evening. If you have difficulty swallowing the tablet whole please discuss with your pharmacist how to take the medication safely.  Take Eliquis  exactly as prescribed and DO NOT stop taking Eliquis  without talking to the doctor who prescribed the medication.  Stopping may increase your risk of developing a new blood clot.  Refill your prescription before you run out.  After discharge, you should have regular check-up appointments with your healthcare provider that is prescribing your Eliquis .    What do you do if you miss a dose? If a dose of ELIQUIS  is not taken at the scheduled time, take it as soon as possible on the same day and twice-daily administration should be resumed. The dose should not be doubled to make up for a missed dose.  Important Safety Information A possible side effect of Eliquis  is bleeding. You should call your healthcare provider right away if you experience any of the following: Bleeding from an injury or your nose that does not stop. Unusual colored urine (red or dark brown) or unusual colored stools (red or black). Unusual bruising for unknown reasons. A serious fall or if you hit your head (even if there is no bleeding).  Some  medicines may interact with Eliquis  and might increase your risk of bleeding or clotting while on Eliquis . To help avoid this, consult your healthcare provider or pharmacist prior to using any new prescription or non-prescription medications, including herbals, vitamins, non-steroidal anti-inflammatory drugs (NSAIDs) and supplements.  This website has more information on Eliquis  (apixaban ): http://[email or web address] .com/eliquis dena
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
## 2024-05-17 NOTE — Progress Notes (Signed)
 Occupational Therapy Session Note  Patient Details  Name: JESON CAMACHO MRN: [id number] Date of Birth: Apr 29, 1981  Session 1: {CHL IP REHAB OT TIME CALCULATIONS:304400400} Session 2: {CHL IP REHAB OT TIME CALCULATIONS:304400400}  Short Term Goals: Week 1:  OT Short Term Goal 1 (Week 1): Pt will improve sit <> stands in stedy with CGA OT Short Term Goal 2 (Week 1): Pt will perform toileting in most appropriate environment with MAX A OT Short Term Goal 3 (Week 1): Pt will improve sitting balance to sit EOB for 5 mins while performing ADL of choice  Skilled Therapeutic Interventions/Progress Updates:    Session 1: Patient agreeable to participate in OT session. Reports *** pain level.   Patient participated in skilled OT session focusing on ***. Therapist facilitated/assessed/developed/educated/integrated/elicited *** in order to improve/facilitate/promote   Session 2: Patient agreeable to participate in OT session. Reports *** pain level.   Patient participated in skilled OT session focusing on ***. Therapist facilitated/assessed/developed/educated/integrated/elicited *** in order to improve/facilitate/promote    Therapy Documentation Precautions:  Precautions Precautions: Fall, Back Precaution Booklet Issued: No Recall of Precautions/Restrictions: Intact Precaution/Restrictions Comments: painful R shoulder, present prior to admission but pt states worse now Required Braces or Orthoses: Sling Restrictions Weight Bearing Restrictions Per Provider Order: Yes RUE Weight Bearing Per Provider Order: Non weight bearing Other Position/Activity Restrictions: Dr. Darnella via secure chat 11/17 - WBAT all extremities. NWB through RUE due to pain.  Therapy/Group: Individual Therapy  Leita Howell, OTR/L,CBIS  Supplemental OT - [facility] Secure Chat Preferred   05/17/2024, 5:07 PM

## 2024-05-17 NOTE — Progress Notes (Addendum)
 Inpatient Rehabilitation Admission Medication Review by a Pharmacist  A complete drug regimen review was completed for this patient to identify any potential clinically significant medication issues.  High Risk Drug Classes Is patient taking? Indication by Medication  Antipsychotic No   Anticoagulant Yes Apixaban  - acute PE  Antibiotic No   Opioid Yes Oxycodone  ER and  OxyIR pain management  Antiplatelet No   Hypoglycemics/insulin No   Vasoactive Medication No   Chemotherapy No   Other Yes Acetaminophen , flexeril ,  methocarbamol , pregabalin  - pain management Ondansetron  - nausea Multivitamin- supplement PEG, bisacodyl , fleet enema, senokot -S, sorbitol  - bowel regimen/ constipation     Type of Medication Issue Identified Description of Issue Recommendation(s)  Drug Interaction(s) (clinically significant)     Duplicate Therapy     Allergy     No Medication Administration End Date     Incorrect Dose     Additional Drug Therapy Needed     Significant med changes from prior encounter (inform family/care partners about these prior to discharge). No medications PTA.   Communicate new medications instructions  to patient /family/ caregiver prior to discharge.   Other       Clinically significant medication issues were identified that warrant physician communication and completion of prescribed/recommended actions by midnight of the next day:  No  Name of provider notified for urgent issues identified:   Provider Method of Notification:    Pharmacist comments:   Time spent performing this drug regimen review (minutes):  15  Levorn Gaskins, [place] Clinical Pharmacist 05/17/2024 11:26 AM

## 2024-05-18 NOTE — Progress Notes (Signed)
 Patient ID: [name], male   DOB: 1981-02-16, 43 y.o.   MRN: [id number]  [phone] called pt father Darryle to introduce self, explain role, discuss discharge process, and inform on ELOS. He confirms pt will d/c to his aunt's home and will have support from various family members. He does not report any current concerns at this time. He reports he is thinking about what his son will be able to do for himself prior to discharge. SW shared there will be an opportunity for hands on family edu closer towards discharge to get an idea of what things will be like at home. Pleased to hear this is an option. SW will follow-up with update after team conference.  Graeme Jude, MSW, LCSW Office: [phone] Cell: [phone] Fax: [phone]

## 2024-05-18 NOTE — Progress Notes (Signed)
 Met with patient to review current situation, team conference and plan of care. Reviwed medications, bowel , bladder, sleep and skin. Reveiwed wound vac and incision care. Continue to follow along to provide educational needs to facilitate preparation for discharge.

## 2024-05-18 NOTE — Progress Notes (Signed)
 PROGRESS NOTE   Subjective/Complaints:   Pt reports that trp injections were extremely helpful.  Not hurting in neck hardly anymore- and no tension.   LBM yesterday on toilet.  Sleepy yesterday due to no sleep the night prior- but slept much better last night.     ROS: Per HPI   Pt denies SOB, abd pain, CP, N/V/C/D, and vision changes    Objective:   No results found. Recent Labs    05/16/24 0426 05/17/24 0749  WBC 10.8* 9.9  HGB 9.9* 9.6*  HCT 31.5* 30.0*  PLT 122* PLATELET CLUMPS NOTED ON SMEAR, UNABLE TO ESTIMATE   Recent Labs    05/16/24 0426 05/17/24 0528  NA 133* 133*  K 4.3 4.4  CL 98 98  CO2 27 26  GLUCOSE 91 117*  BUN 18 14  CREATININE 0.61 0.76  CALCIUM 8.5* 8.4*    Intake/Output Summary (Last 24 hours) at 05/18/2024 1815 Last data filed at 05/18/2024 1300 Gross per 24 hour  Intake 480 ml  Output 1680 ml  Net -1200 ml        Physical Exam: Vital Signs Blood pressure 132/70, pulse 100, temperature 98.4 F (36.9 C), temperature source Oral, resp. rate 18, height 5' 9 (1.753 m), weight (!) 145 kg, SpO2 97%.     General: awake, alert, appropriate, sitting up in bed;  NAD HENT: conjugate gaze; oropharynx moist CV: regular rate and rhythm- rate in 90's; no JVD Pulmonary: CTA B/L; no W/R/R- good air movement GI: soft, NT, ND, (+)BS Psychiatric: appropriate- interactive Neurological: Ox3 Skin- vac on neck  MSK: severe trp's in neck/shoulders and upper back are doing much better today-  HF 2/5; KE 4-/5; DF and PF 4-/5 B/L- could be limited due to pain as well as weakness?  Musculoskeletal:     Cervical back: Tenderness present.     Comments: trp's in neck/sholders and upper back and pecs- Tenderness along posterior and right neck, trap. Right shoulder with generalized tenderness to palpation and attempts at active/passive ER/IR/ABD  Skin:    Comments: Prevena vac in place on  upper thoracic incision. No fluid in cannister.   Neurological:     Mental Status: He is alert.     Comments: Alert and oriented x 3. Normal insight and awareness. Intact Memory. Normal language and speech. Cranial nerve exam unremarkable. MMT: RUE limited by pain in right shoulder. LUE is 5/5 prox to distal. BLE: 4-/5 HF, 4/5 KE and 4+/5 ADF/PF. Sl sensory loss to LT below level of injury, but sensation is near normal. DTR's 1+. No abnl resting tone appreciated. .  Assessment/Plan: 1. Functional deficits which require 3+ hours per day of interdisciplinary therapy in a comprehensive inpatient rehab setting. Physiatrist is providing close team supervision and 24 hour management of active medical problems listed below. Physiatrist and rehab team continue to assess barriers to discharge/monitor patient progress toward functional and medical goals  Care Tool:  Bathing    Body parts bathed by patient: Right arm, Chest, Abdomen, Face   Body parts bathed by helper: Front perineal area, Buttocks, Left arm, Right lower leg, Left lower leg, Right upper leg, Left upper leg  Bathing assist Assist Level: Total Assistance - Patient < 25%     Upper Body Dressing/Undressing Upper body dressing   What is the patient wearing?: Hospital gown only    Upper body assist Assist Level: Total Assistance - Patient < 25%    Lower Body Dressing/Undressing Lower body dressing      What is the patient wearing?: Incontinence brief, Pants     Lower body assist Assist for lower body dressing: 2 Helpers     Toileting Toileting    Toileting assist Assist for toileting: 2 Helpers     Transfers Chair/bed transfer  Transfers assist     Chair/bed transfer assist level: Dependent - mechanical lift     Locomotion Ambulation   Ambulation assist   Ambulation activity did not occur: Safety/medical concerns          Walk 10 feet activity   Assist  Walk 10 feet activity did not occur:  Safety/medical concerns        Walk 50 feet activity   Assist Walk 50 feet with 2 turns activity did not occur: Safety/medical concerns         Walk 150 feet activity   Assist Walk 150 feet activity did not occur: Safety/medical concerns         Walk 10 feet on uneven surface  activity   Assist Walk 10 feet on uneven surfaces activity did not occur: Safety/medical concerns         Wheelchair     Assist Is the patient using a wheelchair?: Yes Type of Wheelchair: Manual    Wheelchair assist level: Dependent - Patient 0%      Wheelchair 50 feet with 2 turns activity    Assist        Assist Level: Dependent - Patient 0%   Wheelchair 150 feet activity     Assist      Assist Level: Dependent - Patient 0%   Blood pressure 132/70, pulse 100, temperature 98.4 F (36.9 C), temperature source Oral, resp. rate 18, height 5' 9 (1.753 m), weight (!) 145 kg, SpO2 97%.  Medical Problem List and Plan: 1. 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 2.  Antithrombotics/ RUL PE:             -continue eliquis . Monitor CBC             -Can use TEDS for swelling if needed             -antiplatelet therapy: n/a   3. Pain Management: Scheduled Tylenol , Flexeril , Robaxin  -palliative care recommended increasing oxyContin   to 100 mg q8.  -will stay at 90mg  q8 for now.  - oxycodone  20mg  q4 prn for breakthrough -Palliative care helping with pain management.    11/25- increased oxycontin  to 100 mg TID per palliative note; and increased robaxin  to 750 mg QID- per nursing, sleepy, but appeared to be able to participate in  therapy  TRP injections today- pain down to 7/10  within 1 minute after injections- really helped per pt  11/26- pt's pain is doing somewhat better- is more awake today as well  4. Mood/Behavior/Sleep: LCSW to follow for evaluation and support when available.  -antipsychotic agents: N/A             -sleep:    5. Neuropsych/cognition: This patient is capable of making decisions on his own behalf.   6. Skin/Wound Care: Routine pressure relief measures. Monitor post op incision for signs of infection. -Prevena in place-remove 11/27  - Remove sutures 3 weeks postop-11/30.    11/26- will remove Prevena in AM 7. Fluids/Electrolytes/Nutrition: Monitor I&O and weight. Follow up labs CBC/CMP     8.  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  Completed course of dexamethasone .  Patient to follow up outpatient with ENT, endocrinology and radiation oncology.    11.  Fever/Leukocytosis: Felt to be multifactorial and due to PE. Steroids course completed. Monitor CBC and vitals for fever on  Tylenol  1000 mg Q8h.    11/25- T 99.5 overnight, but WBC back down to normal range- con't to monitor 12. Hypomagnesemia: Mag 1.7, continue mag supplement.   13.  ABLA: Post procedure EBL 1500 mL, received 2 units PRBC.  Hemoglobin stable.   14.  Right shoulder pain, chronic: Per orthopedics PA follow-up outpatient for suspected rotator cuff pathology.   11/25- In sling for R shoulder pain-   11/26- thought to be RTC injury 14.  Constipation:  LBM 11/24.              -pt is having gas and is sensing when he needs to have bm             -has generally been continent             -stay aggressive with bowel regimen given the amount of opiates he's on.  -bid miralax  and senokot-s 2 tabs bid              -prn sorbitol , dulcolax   11/25- Bowels loose and incontinent- not clear if due to loose stools or need for bowel program- will montor  11/26- Had BM yesterday on toilet 15. Polysubstance  Abuse: Continue discussion of cessation and risk factors.    11/25- will need to see Palliative care after d/c for pain meds- due to high doses-    -  16. Neurogenic Bladder:             -pt has been emptying bladder, typically continent             - check a few PVR's   11/25- bladder scan 52 overnight- don't see any from today- will f/u with nursing   11/26 Upper traps B/L x2- a lot of twitches Levators B/L  Posterior scalenes Middle scalenes- B/L - a lot of twitch responses Splenius Capitus- more laterally- B/L Pectoralis Major Rhomboids Infraspinatus Teres Major/minor Thoracic paraspinals Lumbar paraspinals Other injections-       I spent a total of  37  minutes on total care today- >50% coordination of care- due to  D/w pt about B/B0 pain and sleep- also d/w nursing, review of vitals, and B/B.   LOS: 2 days A FACE TO FACE EVALUATION WAS PERFORMED  Vonnie Ligman 05/18/2024, 6:15 PM

## 2024-05-18 NOTE — Progress Notes (Signed)
 Physical Therapy Session Note  Patient Details  Name: Joseph [name] MRN: [id number] Date of Birth: [birth year]  Today's Date: 05/18/2024 PT Individual Time: 0930-1015 PT Individual Time Calculation (min): 45 min   Short Term Goals: Week 1:  PT Short Term Goal 1 (Week 1): [name] perform STS with assist of 1 using stedy or LRAD PT Short Term Goal 2 (Week 1): [name] perform bed mobiltiy with assist of 1 PT Short Term Goal 3 (Week 1): [name] initiate transfer training with RW, transfer board, or squat pivot  Skilled Therapeutic Interventions/Progress Updates: Pt presented in TIS agreeable to therapy. Pt states mild unrated pain in RUE (in sling for comfort). PTA providing education/introduction to binder and added SCI pamphlet. Pt then transported to day room for environmental change. Pt participated in ankle pumps, LAQ 2 x 10, hamstring pulls with green theraband x 10, hip ER with green theraband 2 x 10. Pt noted to requiring significant effort and required extended rest breaks between bouts.      Therapy Documentation Precautions:  Precautions Precautions: Fall, Back Precaution Booklet Issued: No Recall of Precautions/Restrictions: Intact Precaution/Restrictions Comments: painful R shoulder, present prior to admission but pt states worse now Required Braces or Orthoses: Sling Restrictions Weight Bearing Restrictions Per Provider Order: Yes RUE Weight Bearing Per Provider Order: Non weight bearing RLE Weight Bearing Per Provider Order: Non weight bearing Other Position/Activity Restrictions: Dr. Darnella via secure chat 11/17 - WBAT all extremities. NWB through RUE due to pain. General:   Vital Signs:   Pain: Pain Assessment Pain Scale: 0-10 Pain Score: 9  Pain Intervention(s): Medication (See eMAR)  Therapy/Group: Individual Therapy  Johari Pinney 05/18/2024, 12:56 PM

## 2024-05-18 NOTE — Care Management (Signed)
 Inpatient Rehabilitation Center Individual Statement of Services  Patient Name:  Joseph [name]  Date:  05/18/2024  Welcome to the Inpatient Rehabilitation Center.  Our goal is to provide you with an individualized program based on your diagnosis and situation, designed to meet your specific needs.  With this comprehensive rehabilitation program, you will be expected to participate in at least 3 hours of rehabilitation therapies Monday-Friday, with modified therapy programming on the weekends.  Your rehabilitation program will include the following services:  Physical Therapy (PT), Occupational Therapy (OT), 24 hour per day rehabilitation nursing, Therapeutic Recreaction (TR), Psychology, Neuropsychology, Care Coordinator, Rehabilitation Medicine, Nutrition Services, Pharmacy Services, and Other  Weekly team conferences will be held on Tuesday to discuss your progress.  Your Inpatient Rehabilitation Care Coordinator will talk with you frequently to get your input and to update you on team discussions.  Team conferences with you and your family in attendance may also be held.  Expected length of stay: 2-3 weeks    Overall anticipated outcome: Minimal Assistance  Depending on your progress and recovery, your program may change. Your Inpatient Rehabilitation Care Coordinator will coordinate services and will keep you informed of any changes. Your Inpatient Rehabilitation Care Coordinator's name and contact numbers are listed  below.  The following services may also be recommended but are not provided by the Inpatient Rehabilitation Center:  Driving Evaluations Home Health Rehabiltiation Services Outpatient Rehabilitation Services Vocational Rehabilitation   Arrangements will be made to provide these services after discharge if needed.  Arrangements include referral to agencies that provide these services.  Your insurance has been verified to be:  [employer]  Your primary  doctor is:  No PCP listed  Pertinent information will be shared with your doctor and your insurance company.  Inpatient Rehabilitation Care Coordinator:  Graeme Jude, KEN [phone] or ([phone]  Information discussed with and copy given to patient by: [name], 05/18/2024, 10:17 AM

## 2024-05-18 NOTE — Progress Notes (Signed)
 Inpatient Rehabilitation Care Coordinator Assessment and Plan Patient Details  Name: Joseph [name] MRN: [id number] Date of Birth: [birth year]  Today's Date: 05/18/2024  Hospital Problems: Principal Problem:   Acute incomplete paraplegia [facility]) Active Problems:   Metastatic cancer to spine [facility])   Papillary thyroid  carcinoma [facility])   Pathologic fracture of thoracic vertebrae  Past Medical History:  Past Medical History:  Diagnosis Date   Anxiety    Asthma    as Joseph child   Depression    Narcotic abuse (HCC)    Pulmonary nodules 04/29/2024   Past Surgical History:  Past Surgical History:  Procedure Laterality Date   CLOSED REDUCTION FINGER WITH PERCUTANEOUS PINNING Right 06/22/2013   Procedure: RIGHT HAND SMALL FINGER CLOSED MANIPULATION AND PINNING;  Surgeon: Joseph LELON Pagan, MD;  Location: [facility] OR;  Service: Orthopedics;  Laterality: Right;   I & D EXTREMITY  03/24/2012   Procedure: IRRIGATION AND DEBRIDEMENT EXTREMITY;  Surgeon: Joseph JAYSON Herald, MD;  Location: [facility] OR;  Service: Orthopedics;  Laterality: Right;  Right Forearm   POSTERIOR CERVICAL FUSION/FORAMINOTOMY N/Joseph 05/05/2024   Procedure: POSTERIOR CERVICAL FUSION CERVICAL SEVEN-THORACIC ONE, THORACIC ONE-THORACIC TWO, THORACIC TWO-THORACIC THREE ,FOR RESECTION OF TUMOR THORACIC TWO CORPECTOMY;  Surgeon: Joseph Dorn SAUNDERS, MD;  Location: [facility] OR;  Service: Neurosurgery;  Laterality: N/Joseph;   Social History:  reports that he has been smoking cigarettes. He has Joseph 10 pack-year smoking history. He has never used smokeless tobacco. He reports that he does not currently use alcohol. He reports current drug use. Drug: Marijuana.  Family / Support Systems Marital Status: Divorced How Long?: 10 years Patient Roles: Parent Spouse/Significant Other: Divorced Children: 24 yr old; and one child on the way Other Supports: mother, father, and various family members Anticipated Caregiver: TBD Ability/Limitations of Caregiver: Pt will discharge  to his aunt's home who will allow him to remain in her home. Per pt mother, aunt recently diagnosed with breast cancer and unabe to provide any assitnace since she will be going through treatment. Pt mother reports tha everyone works and pt will need assistance when he goes home. 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 Last worked 3.5 weeks ago Education: high school Primary School Teacher - How often do you need to have someone help you when you read instructions, pamphlets, or other written material from your doctor or pharmacy?: Never Writes: Yes Employment Status: Unemployed Marine Scientist Issues: Pt reports an incident 5 years ago in which he served 30 days but no other issues since. Guardian/Conservator: No HCPOA. Would like to complete while here. Informed his assigned RN.   Abuse/Neglect Abuse/Neglect Assessment Can Be Completed: Yes Physical Abuse: Denies Verbal Abuse: Denies Sexual Abuse: Denies Exploitation of patient/patient's resources: Denies Self-Neglect: Denies  Patient response to: Social Isolation - How often do you feel lonely or isolated from those around you?: Rarely  Emotional Status Pt's affect, behavior and adjustment status: Pt in good spirits at time of visit Recent Psychosocial Issues: Denies Psychiatric History: Pt reports Joseph hx of dperession and anxiety. No meds; has taken Zoloft  in the past; counseling services in the past as well. Substance Abuse History: Pt admits to smoking atleast 10 cigarettes per day and unsure if he would like to quit. Pt reports he quit drinking 7-8 yrs ago.  Admits to rec  drug use fentanyl  and THC daily. Reports ready to quit.  Patient / Family Perceptions, Expectations & Goals Pt/Family understanding of  illness & functional limitations: Pt and family have Joseph general understanding of care needs Premorbid pt/family roles/activities: Independent Anticipated changes in roles/activities/participation: Assistance with ADLs/IADLs Pt/family expectations/goals: Pt goal is to work on staying in Joseph positive state of mind; would like to be independent where he can get up and go to bathroom on his own, walking etc.  [employer]: None Premorbid Home Care/DME Agencies: None Transportation available at discharge: TBD Is the patient able to respond to transportation needs?: Yes In the past 12 months, has lack of transportation kept you from medical appointments or from getting medications?: No In the past 12 months, has lack of transportation kept you from meetings, work, or from getting things needed for daily living?: No Resource referrals recommended: Neuropsychology  Discharge Planning Living Arrangements: Other relatives Support Systems: Other relatives, Parent Type of Residence: Private residence Insurance Resources: Media Planner (specify) Administrator Tailored PLan) Financial Resources: Employment Surveyor, Quantity Screen Referred: No Money Management: Family Does the patient have any problems obtaining your medications?: No Home Management: N/Joseph Patient/Family Preliminary Plans: TBD Care Coordinator Barriers to Discharge: Decreased caregiver support, Lack of/limited family support, Insurance for SNF coverage Care Coordinator Anticipated Follow Up Needs: HH/OP Expected length of stay: 2-3 weeks  Clinical Impression SW met with pt and pt mother. SW informed on limitations of services able to put in place due to insurance. Pt has no DME. SW will continue to provide updates as available.   Joseph [name] Joseph [name] 05/18/2024, 4:01 PM

## 2024-05-18 NOTE — Progress Notes (Signed)
 Physical Therapy Session Note  Patient Details  Name: Joseph [name] MRN: [id number] Date of Birth: 1980/10/23  Today's Date: 05/18/2024 PT Individual Time: 1105-1201 PT Individual Time Calculation (min): 56 min   Short Term Goals: Week 1:  PT Short Term Goal 1 (Week 1): pt will perform STS with assist of 1 using stedy or LRAD PT Short Term Goal 2 (Week 1): Pt will perform bed mobiltiy with assist of 1 PT Short Term Goal 3 (Week 1): Pt will initiate transfer training with RW, transfer board, or squat pivot  Skilled Therapeutic Interventions/Progress Updates:  Patient seated upright in TIS w/c with min back tilt on entrance to room. Patient alert and agreeable to PT session.   Patient with 9/10 pain complaint at start of session. Chart reviewed available medications and messaged RN re: prn oxycodone  and tylenol  administration. Prior to leaving room for session, pt is able to take medications.   Relates need to toilet. Pt transferred to toilet via STEDY transfer device requiring MaxA to stand to perch seat, descent to toilet with ModA +2. Completed toileting with continent urine void and passing of gas only. Then requires MaxA for pericare and return to stance with ModA +2. Then ModA +2 to descend to w/c.  Throughout STEDY transfer and placement of Bil feet on/ off w/c legrests, pt notes that he was not able to move legs as well the previous day.   Discussed with pt his desires in results during therapy when pt relates desire to walk again and to reach more independent level. Provided education re: slow nature of healing of spinal cord. Potential for continued need for physical assist on return home.   Also discussed need for alternative method of transferring seat to seat instead of use of STEDY in the event that pt's strength to use STEDY does not improve. Due to his increase in motor control with BLE, discussed use of slide board and educated on head/ hips relationship. Pt receptive to  attempt. After description and demonstration of technique for pt to perform, he requires TotA for board placement, then Mod/ MaxA +2 to cross board to target seat. Remains in unsupported seated position on EOM with SBA. Then returns to w/c seat with MaxA +2 overall in order to position well into w/c seat with neutral body positioning.   Patient seated upright in TIS w/c with only slight tilt at end of session with brakes locked, belt alarm set, and all needs within reach.    Therapy Documentation Precautions:  Precautions Precautions: Fall, Back Precaution Booklet Issued: No Recall of Precautions/Restrictions: Intact Precaution/Restrictions Comments: painful R shoulder, present prior to admission but pt states worse now Required Braces or Orthoses: Sling Restrictions Weight Bearing Restrictions Per Provider Order: Yes RUE Weight Bearing Per Provider Order: Non weight bearing RLE Weight Bearing Per Provider Order: Non weight bearing Other Position/Activity Restrictions: Dr. Darnella via secure chat 11/17 - WBAT all extremities. NWB through RUE due to pain.  Pain: 9/10 pain at start of session and addressed with RN providing prn oxycodone  20mg  with tylenol .    Therapy/Group: Individual Therapy  Mliss DELENA Milliner PT, DPT, CSRS 05/18/2024, 2:32 PM

## 2024-05-18 NOTE — Plan of Care (Signed)
  Problem: Consults Goal: RH SPINAL CORD INJURY PATIENT EDUCATION Description:  See Patient Education module for education specifics.  05/18/2024 1847 by Lang Saucier, RN Outcome: Progressing 05/18/2024 1721 by Lang Saucier, RN Outcome: Progressing   Problem: SCI BOWEL ELIMINATION Goal: RH STG MANAGE BOWEL WITH ASSISTANCE Description: STG Manage Bowel with supevision - min assist  Assistance. 05/18/2024 1847 by Lang Saucier, RN Outcome: Progressing 05/18/2024 1721 by Lang Saucier, RN Outcome: Progressing   Problem: SCI BLADDER ELIMINATION Goal: RH STG MANAGE BLADDER WITH ASSISTANCE Description: STG Manage Bladder With supervision- min Assistance 05/18/2024 1847 by Lang Saucier, RN Outcome: Progressing 05/18/2024 1721 by Lang Saucier, RN Outcome: Progressing   Problem: RH SKIN INTEGRITY Goal: RH STG SKIN FREE OF INFECTION/BREAKDOWN Description: Manage skin free of infection with supervision- min assistance 05/18/2024 1847 by Lang Saucier, RN Outcome: Progressing 05/18/2024 1721 by Lang Saucier, RN Outcome: Progressing   Problem: RH SAFETY Goal: RH STG ADHERE TO SAFETY PRECAUTIONS W/ASSISTANCE/DEVICE Description: STG Adhere to Safety Precautions With supervision - min Assistance/Device. 05/18/2024 1847 by Lang Saucier, RN Outcome: Progressing 05/18/2024 1721 by Lang Saucier, RN Outcome: Progressing   Problem: RH PAIN MANAGEMENT Goal: RH STG PAIN MANAGED AT OR BELOW PT'S PAIN GOAL Description: <4 w/ prns 05/18/2024 1847 by Lang Saucier, RN Outcome: Progressing 05/18/2024 1721 by Lang Saucier, RN Outcome: Progressing   Problem: RH KNOWLEDGE DEFICIT SCI Goal: RH STG INCREASE KNOWLEDGE OF SELF CARE AFTER SCI Description: Manage increase  knowledge of self care after SCI with supervision- min assistance from family using educational materials provided 05/18/2024 1847 by  Lang Saucier, RN Outcome: Progressing 05/18/2024 1721 by Lang Saucier, RN Outcome: Progressing

## 2024-05-18 NOTE — Plan of Care (Signed)
  Problem: Consults Goal: RH SPINAL CORD INJURY PATIENT EDUCATION Description:  See Patient Education module for education specifics.  Outcome: Progressing   Problem: SCI BOWEL ELIMINATION Goal: RH STG MANAGE BOWEL WITH ASSISTANCE Description: STG Manage Bowel with supevision - min assist  Assistance. Outcome: Progressing   Problem: SCI BLADDER ELIMINATION Goal: RH STG MANAGE BLADDER WITH ASSISTANCE Description: STG Manage Bladder With supervision- min Assistance Outcome: Progressing   Problem: RH SKIN INTEGRITY Goal: RH STG SKIN FREE OF INFECTION/BREAKDOWN Description: Manage skin free of infection with supervision- min assistance Outcome: Progressing   Problem: RH SAFETY Goal: RH STG ADHERE TO SAFETY PRECAUTIONS W/ASSISTANCE/DEVICE Description: STG Adhere to Safety Precautions With supervision - min Assistance/Device. Outcome: Progressing   Problem: RH PAIN MANAGEMENT Goal: RH STG PAIN MANAGED AT OR BELOW PT'S PAIN GOAL Description: <4 w/ prns Outcome: Progressing   Problem: RH KNOWLEDGE DEFICIT SCI Goal: RH STG INCREASE KNOWLEDGE OF SELF CARE AFTER SCI Description: Manage increase  knowledge of self care after SCI with supervision- min assistance from family using educational materials provided Outcome: Progressing

## 2024-05-19 ENCOUNTER — Inpatient Hospital Stay (HOSPITAL_COMMUNITY): Payer: MEDICAID

## 2024-05-19 MED ORDER — LORAZEPAM 2 MG/ML IJ SOLN
2.0000 mg | Freq: Once | INTRAMUSCULAR | Status: AC
  Administered 2024-05-19: 2 mg via INTRAVENOUS
  Filled 2024-05-19: qty 1

## 2024-05-19 MED ORDER — OXYCODONE HCL ER 20 MG PO T12A
100.0000 mg | EXTENDED_RELEASE_TABLET | Freq: Three times a day (TID) | ORAL | Status: DC
  Administered 2024-05-19 – 2024-05-20 (×6): 100 mg via ORAL
  Filled 2024-05-19: qty 10
  Filled 2024-05-19 (×3): qty 5
  Filled 2024-05-19: qty 10
  Filled 2024-05-19: qty 5

## 2024-05-19 MED ORDER — LORAZEPAM 0.5 MG PO TABS: 2.0000 mg | ORAL_TABLET | Freq: Once | ORAL | Status: DC

## 2024-05-19 NOTE — IPOC Note (Signed)
 Overall Plan of Care [facility]) Patient Details Name: Joseph [name] MRN: [id number] DOB: February 22, 1981  Admitting Diagnosis: Acute incomplete paraplegia [facility])  Hospital Problems: Principal Problem:   Acute incomplete paraplegia (HCC) Active Problems:   Metastatic cancer to spine (HCC)   Papillary thyroid  carcinoma (HCC)   Pathologic fracture of thoracic vertebrae     Functional Problem List: Nursing Bladder, Bowel, Edema, Endurance, Medication Management, Motor, Pain, Safety, Skin Integrity  PT Balance, Skin Integrity, Pain, Endurance, Safety, Motor, Sensory  OT Balance, Edema, Endurance, Motor, Pain, Perception, Safety, Skin Integrity  SLP    TR         Basic ADL's: OT Eating, Grooming, Bathing, Dressing, Toileting     Advanced  ADL's: OT None     Transfers: PT Bed Mobility, Bed to Chair  OT Toilet     Locomotion: PT Wheelchair Mobility     Additional Impairments: OT None  SLP        TR      Anticipated Outcomes Item Anticipated Outcome  Self Feeding MOD I  Swallowing      Basic self-care  MOD A  Toileting  MOD A   Bathroom Transfers planning via lift equipment given prognosis and plan to get pt home to be with family  Bowel/Bladder  manage bowels with medications/ manage bladder with toileting assistance  Transfers  min a with stedy, hoyer transfer  Locomotion  mod I w/c  Communication     Cognition     Pain  <4 w/ prns  Safety/Judgment  Manage safety with supervision to min assistance w/c level   Therapy Plan: PT Intensity: Minimum of 1-2 x/day ,45 to 90 minutes PT Frequency: 5 out of 7 days PT Duration Estimated Length of Stay: 2-3 weeks OT Intensity: Minimum of 1-2 x/day, 45 to 90 minutes OT Frequency: 5 out of 7 days OT Duration/Estimated Length of Stay: 2-3 weeks     Team Interventions: Nursing Interventions Patient/Family Education, Skin Care/Wound Management, Bladder Management, Bowel Management, Disease Management/Prevention, Pain  Management, Medication Management, Discharge Planning  PT interventions Ambulation/gait training, Cognitive remediation/compensation, Discharge planning, DME/adaptive equipment instruction, Functional mobility training, Pain management, Psychosocial support, Splinting/orthotics, Therapeutic Activities, UE/LE Strength taining/ROM, Visual/perceptual remediation/compensation, Warden/ranger, Community reintegration, Disease management/prevention, Functional electrical stimulation, Neuromuscular re-education, Patient/family education, Skin care/wound management, Stair training, Therapeutic Exercise, UE/LE Coordination activities, Wheelchair propulsion/positioning  OT Interventions Warden/ranger, Self Care/advanced ADL retraining, UE/LE Coordination activities, Functional mobility training, Visual/perceptual remediation/compensation, Skin care/wound managment, Community reintegration, Neuromuscular re-education, Splinting/orthotics, Wheelchair propulsion/positioning, Discharge planning, Pain management, Therapeutic Activities, Disease mangement/prevention, Patient/family education, Therapeutic Exercise, DME/adaptive equipment instruction, Psychosocial support, UE/LE Strength taining/ROM  SLP Interventions    TR Interventions    SW/CM Interventions Discharge Planning, Psychosocial Support, Patient/Family Education   Barriers to Discharge MD  Medical stability, Home enviroment access/loayout, Incontinence, Neurogenic bowel and bladder, Wound care, Weight, Weight bearing restrictions, and Pending chemo/radiation  Nursing Decreased caregiver support, Home environment access/layout, Incontinence Discharge: House  Discharge Home Layout: One level  Discharge Home Access: Stairs to enter  Entrance Stairs-Rails: None  Entrance Stairs-Number of Steps: 1  PT Inaccessible home environment, Decreased caregiver support, Home environment access/layout, Neurogenic Bowel & Bladder, Wound Care, Other  (comments) complications of cancer diagnosis, poor prognosis  OT Decreased caregiver support DC plan TBD  SLP      SW Decreased caregiver support, Lack of/limited family support, Insurance for SNF coverage     Team Discharge Planning: Destination: PT-Home ,OT- Home , SLP-  Projected Follow-up: PT-Home  health PT, OT-  Home health OT, SLP-  Projected Equipment Needs: PT-To be determined, OT- To be determined, SLP-  Equipment Details: PT- , OT-  Patient/family involved in discharge planning: PT- Patient,  OT-Patient, SLP-   MD ELOS: 2-3 weeks Medical Rehab Prognosis:  Good Assessment: The patient has been admitted for CIR therapies with the diagnosis of 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 The team will be addressing functional mobility, strength, stamina, balance, safety, adaptive techniques and equipment, self-care, bowel and bladder mgt, patient and caregiver education, . Goals have been set at min- mod A. Anticipated discharge destination is home with family.        See Team Conference Notes for weekly updates to the plan of care

## 2024-05-19 NOTE — Progress Notes (Signed)
 PROGRESS NOTE   Subjective/Complaints:  Pt reports slept a little better Still having a lot of pain- feels like oxycodone  doses don't help a lot. Gets 20 mg q4 hours   LBM 2 days ago- doesn't really feel constipated today.   Today is day to remove VAC  ROS: Per HPI   Pt denies SOB, abd pain, CP, N/V/C/D, and vision changes    Objective:   No results found. Recent Labs    05/17/24 0749  WBC 9.9  HGB 9.6*  HCT 30.0*  PLT PLATELET CLUMPS NOTED ON SMEAR, UNABLE TO ESTIMATE   Recent Labs    05/17/24 0528  NA 133*  K 4.4  CL 98  CO2 26  GLUCOSE 117*  BUN 14  CREATININE 0.76  CALCIUM 8.4*    Intake/Output Summary (Last 24 hours) at 05/19/2024 0934 Last data filed at 05/19/2024 0827 Gross per 24 hour  Intake 720 ml  Output 1150 ml  Net -430 ml        Physical Exam: Vital Signs Blood pressure 135/62, pulse 96, temperature 98.3 F (36.8 C), temperature source Oral, resp. rate 18, height 5' 9 (1.753 m), weight (!) 145 kg, SpO2 97%.       General: awake, alert, appropriate, woke pt up; supine in bed;  NAD HENT: conjugate gaze; oropharynx moist CV: regular rate and rhythm- rate in 90's; no JVD Pulmonary: CTA B/L; no W/R/R- good air movement GI: soft, NT, ND, (+)BS- hypoactive Psychiatric: appropriate- interactive Neurological: Ox3 Skin- vac on neck MSK: severe trp's in neck/shoulders and upper back are doing much better today-  HF 2/5; KE 4-/5; DF and PF 4-/5 B/L- could be limited due to pain as well as weakness?  Musculoskeletal:     Cervical back: Tenderness present.     Comments: trp's in neck/sholders and upper back and pecs- Tenderness along posterior and right neck, trap. Right shoulder with generalized tenderness to palpation and attempts at active/passive ER/IR/ABD  Skin:    Comments: Prevena vac in place on upper thoracic incision. No fluid in cannister.   Neurological:     Mental  Status: He is alert.     Comments: Alert and oriented x 3. Normal insight and awareness. Intact Memory. Normal language and speech. Cranial nerve exam unremarkable. MMT: RUE limited by pain in right shoulder. LUE is 5/5 prox to distal. BLE: 4-/5 HF, 4/5 KE and 4+/5 ADF/PF. Sl sensory loss to LT below level of injury, but sensation is near normal. DTR's 1+. No abnl resting tone appreciated. .  Assessment/Plan: 1. Functional deficits which require 3+ hours per day of interdisciplinary therapy in a comprehensive inpatient rehab setting. Physiatrist is providing close team supervision and 24 hour management of active medical problems listed below. Physiatrist and rehab team continue to assess barriers to discharge/monitor patient progress toward functional and medical goals  Care Tool:  Bathing    Body parts bathed by patient: Right arm, Chest, Abdomen, Face   Body parts bathed by helper: Front perineal area, Buttocks, Left arm, Right lower leg, Left lower leg, Right upper leg, Left upper leg     Bathing assist Assist Level: Total Assistance - Patient < 25%  Upper Body Dressing/Undressing Upper body dressing   What is the patient wearing?: Hospital gown only    Upper body assist Assist Level: Total Assistance - Patient < 25%    Lower Body Dressing/Undressing Lower body dressing      What is the patient wearing?: Incontinence brief, Pants     Lower body assist Assist for lower body dressing: 2 Helpers     Toileting Toileting    Toileting assist Assist for toileting: 2 Helpers     Transfers Chair/bed transfer  Transfers assist     Chair/bed transfer assist level: Dependent - mechanical lift     Locomotion Ambulation   Ambulation assist   Ambulation activity did not occur: Safety/medical concerns          Walk 10 feet activity   Assist  Walk 10 feet activity did not occur: Safety/medical concerns        Walk 50 feet activity   Assist Walk 50 feet  with 2 turns activity did not occur: Safety/medical concerns         Walk 150 feet activity   Assist Walk 150 feet activity did not occur: Safety/medical concerns         Walk 10 feet on uneven surface  activity   Assist Walk 10 feet on uneven surfaces activity did not occur: Safety/medical concerns         Wheelchair     Assist Is the patient using a wheelchair?: Yes Type of Wheelchair: Manual    Wheelchair assist level: Dependent - Patient 0%      Wheelchair 50 feet with 2 turns activity    Assist        Assist Level: Dependent - Patient 0%   Wheelchair 150 feet activity     Assist      Assist Level: Dependent - Patient 0%   Blood pressure 135/62, pulse 96, temperature 98.3 F (36.8 C), temperature source Oral, resp. rate 18, height 5' 9 (1.753 m), weight (!) 145 kg, SpO2 97%.  Medical Problem List and Plan: 1. 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 2.  Antithrombotics/ RUL PE:             -continue eliquis . Monitor CBC             -Can use TEDS for swelling if needed             -antiplatelet therapy: n/a   3. Pain Management: Scheduled Tylenol , Flexeril , Robaxin  -palliative care recommended increasing oxyContin   to 100 mg q8.  -will stay at 90mg  q8 for now.  - oxycodone  20mg  q4 prn for breakthrough -Palliative care helping with pain management.    11/25- increased oxycontin  to 100 mg TID per palliative note; and increased robaxin  to 750 mg QID- per nursing, sleepy, but appeared to be able to participate in therapy  TRP injections today- pain down to 7/10 within 1 minute after injections-  really helped per pt  11/26- pt's  pain is doing somewhat better- is more awake today as well   11/27- will see if removing VAC will help pain- it frequently does- if not, will call Palliative care tomorrow to discuss pain med regimen 4. Mood/Behavior/Sleep: LCSW to follow for evaluation and support when available.  -antipsychotic agents: N/A             -sleep:    5. Neuropsych/cognition: This patient is capable of making decisions on his own behalf.   6. Skin/Wound Care: Routine pressure relief measures. Monitor post op incision for signs of infection. -Prevena in place-remove 11/27  - Remove sutures 3 weeks postop-11/30.    11/26- will remove Prevena in AM  11/27- wrote to remove today 7. Fluids/Electrolytes/Nutrition: Monitor I&O and weight. Follow up labs CBC/CMP     8.  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  Completed course of dexamethasone .  Patient to follow up outpatient with ENT, endocrinology and radiation oncology.    11.  Fever/Leukocytosis: Felt to be multifactorial and due to PE. Steroids course completed. Monitor CBC and vitals for fever on  Tylenol  1000 mg Q8h.    11/25- T 99.5 overnight, but WBC back down to normal range- con't to monitor  11/27- reordered labs for Monday 12. Hypomagnesemia: Mag 1.7, continue mag supplement.   13.  ABLA: Post procedure EBL 1500 mL, received 2 units PRBC.  Hemoglobin stable.   11/27- labs Monday 14.  Right shoulder pain, chronic: Per orthopedics PA follow-up outpatient for suspected rotator cuff pathology.   11/25- In sling for R shoulder pain-   11/26- thought to be RTC injury  11/27- will get MRI since affecting pt's ability to participate in therapy 14.  Constipation:  LBM 11/24.              -pt is having gas and is sensing when he needs to have bm             -has generally been continent             -stay aggressive with bowel regimen given the amount of opiates he's on.  -bid miralax  and senokot-s  2 tabs bid              -prn sorbitol , dulcolax   11/25- Bowels loose and incontinent- not clear if due to loose stools or need for bowel program- will montor  11/26- Had BM yesterday on toilet  11/27- LBM 2 days ago- if doesn't have 1 by tomorrow, will need to intervene 15. Polysubstance Abuse: Continue discussion of cessation and risk factors.    11/25- will need to see Palliative care after d/c for pain meds- due to high doses-    -  16. Neurogenic Bladder:             -pt has been emptying bladder, typically continent             - check a few PVR's   11/25- bladder scan 52 overnight- don't see any from today- will f/u with nursing   11/26 Upper traps B/L x2- a lot of twitches Levators B/L  Posterior scalenes Middle scalenes- B/L - a lot of twitch responses Splenius Capitus- more laterally- B/L Pectoralis Major Rhomboids Infraspinatus Teres Major/minor Thoracic paraspinals Lumbar paraspinals Other injections-     I spent a total of 41   minutes on total care today- >50% coordination of care- due to  D/w pt about VAC removal and pain meds- also d/w nursing about Shoulder  MRI and VAC- review of orders and ordered labs- and IPOC  LOS: 3 days A FACE TO FACE EVALUATION WAS PERFORMED  Maceo Hernan 05/19/2024, 9:34 AM

## 2024-05-20 ENCOUNTER — Encounter (INDEPENDENT_AMBULATORY_CARE_PROVIDER_SITE_OTHER): Payer: Self-pay

## 2024-05-20 ENCOUNTER — Inpatient Hospital Stay (HOSPITAL_COMMUNITY): Payer: MEDICAID

## 2024-05-20 ENCOUNTER — Encounter (HOSPITAL_COMMUNITY): Payer: Self-pay

## 2024-05-20 DIAGNOSIS — C73 Malignant neoplasm of thyroid gland: Secondary | ICD-10-CM

## 2024-05-20 DIAGNOSIS — M84421A Pathological fracture, right humerus, initial encounter for fracture: Secondary | ICD-10-CM | POA: Insufficient documentation

## 2024-05-20 DIAGNOSIS — D649 Anemia, unspecified: Secondary | ICD-10-CM

## 2024-05-20 DIAGNOSIS — I2699 Other pulmonary embolism without acute cor pulmonale: Secondary | ICD-10-CM

## 2024-05-20 DIAGNOSIS — G8222 Paraplegia, incomplete: Secondary | ICD-10-CM

## 2024-05-20 DIAGNOSIS — M84521A Pathological fracture in neoplastic disease, right humerus, initial encounter for fracture: Secondary | ICD-10-CM

## 2024-05-20 MED ORDER — CEFAZOLIN SODIUM-DEXTROSE 3-4 GM/150ML-% IV SOLN
3.0000 g | INTRAVENOUS | Status: DC
  Filled 2024-05-20: qty 150

## 2024-05-20 NOTE — Progress Notes (Signed)
 Physical Therapy Note  Patient Details  Name: Joseph [name] MRN: [id number] Date of Birth: 08-13-1980 Today's Date: 05/20/2024    Physical Therapy Discharge Note  This patient was unable to complete the inpatient rehab program due to new humerus fx requiring surgical intervention; therefore did not meet their long term goals. Pt left the program at a total+2 assist level for their functional mobility/ transfers. This patient is being discharged from PT services at this time.  Pt's perception of pain in the last five days was unable to answer at this time.    See CareTool for functional status details  If the patient is able to return to inpatient rehabilitation within 3 midnights, this may be considered an interrupted stay and therapy services will resume as ordered. Modification and reinstatement of their goals will be made upon completion of therapy service reevaluations.     Joseph [name] Joseph [name] Joseph [name] Joseph, PT, DPT, CBIS  05/20/2024, 3:17 PM

## 2024-05-20 NOTE — Progress Notes (Signed)
 Occupational Therapy Session Note  Patient Details  Name: Joseph [name] MRN: [id number] Date of Birth: 26-Apr-1981  Today's Date: 05/20/2024 OT Individual Time: 9069-8984 OT Individual Time Calculation (min): 45 min  and Today's Date: 05/20/2024 OT Missed Time: 30 Minutes Missed Time Reason: Patient fatigue;Pain   Short Term Goals: Week 1:  OT Short Term Goal 1 (Week 1): Pt will improve sit <> stands in stedy with CGA OT Short Term Goal 2 (Week 1): Pt will perform toileting in most appropriate environment with MAX A OT Short Term Goal 3 (Week 1): Pt will improve sitting balance to sit EOB for 5 mins while performing ADL of choice  Skilled Therapeutic Interventions/Progress Updates:    Pt seated on BSC over toilet upon arrival with NT present. Skilled OT intervention with focus on sit<>stand and standing balance to facilitate toileting hygiene and transfer back to w/c via Stedy. Sit<>stand tot A+3 in Stedy X 7 and standing ~10 secs each time followed by extended rest breaks. Pt unable to maintain standing adequate amount of time to complete hygiene. Pt with increased pain with each attempt. Pt transferred back to TIS with Stedy. Pt with increased pain and exhaustion following toileting. Pt missed 30 mins skilled OT services 2/2 increased pain and fatigue. Will attempt to see pt again as schedule allows.   Therapy Documentation Precautions:  Precautions Precautions: Fall, Back Precaution Booklet Issued: No Recall of Precautions/Restrictions: Intact Precaution/Restrictions Comments: painful R shoulder, present prior to admission but pt states worse now Required Braces or Orthoses: Sling Restrictions Weight Bearing Restrictions Per Provider Order: Yes RUE Weight Bearing Per Provider Order: Non weight bearing RLE Weight Bearing Per Provider Order: Non weight bearing LLE Weight Bearing Per Provider Order: Non weight bearing Other Position/Activity Restrictions: Dr. Darnella via secure chat  11/17 - WBAT all extremities. NWB through RUE due to pain. General: General OT Amount of Missed Time: 30 Minutes Vital Signs: Therapy Vitals BP: (!) 136/90 (taken after bm) Patient Position (if appropriate): Sitting Pain: Pt c/o 9/10 RUE/shoulder pain; meds admin prior to therapy   Therapy/Group: Individual Therapy  [name] 05/20/2024, 10:45 AM

## 2024-05-20 NOTE — Progress Notes (Signed)
 D/C report provided to [address] nurse [place], [facility]. Pt transported to floor via bed all personal belongings removed from room and placed in bed with pt. Pt medicated with scheduled po analgesic prior leaving floor.

## 2024-05-20 NOTE — Progress Notes (Addendum)
 PROGRESS NOTE   Subjective/Complaints:  Per nursing, didn't get VAC off- will do today  Pt reports still very sore- felt like day of rest a little helpful.   LBM yesterday per chart  ROS: Per HPI   Pt denies SOB, abd pain, CP, N/V/C/D, and vision changes    Objective:   No results found. No results for input(s): WBC, HGB, HCT, PLT in the last 72 hours.  No results for input(s): NA, K, CL, CO2, GLUCOSE, BUN, CREATININE, CALCIUM in the last 72 hours.   Intake/Output Summary (Last 24 hours) at 05/20/2024 0936 Last data filed at 05/20/2024 0729 Gross per 24 hour  Intake 720 ml  Output 2400 ml  Net -1680 ml        Physical Exam: Vital Signs Blood pressure (!) 144/88, pulse (!) 103, temperature 98.9 F (37.2 C), temperature source Oral, resp. rate 16, height 5' 9 (1.753 m), weight (!) 145 kg, SpO2 97%.        General: awake, alert, appropriate, sitting up in bed; nurse at bedside; NAD HENT: conjugate gaze; oropharynx moist CV: regular rhythm; borderline tachycardic; no JVD Pulmonary: CTA B/L; no W/R/R- good air movement GI: soft, NT, ND, (+)BS- protuberant Psychiatric: appropriate, quiet Neurological: Ox3  Skin- vac on neck MSK: severe trp's in neck/shoulders and upper back are doing much better today-  HF 2/5; KE 4-/5; DF and PF 4-/5 B/L- could be limited due to pain as well as weakness?  Musculoskeletal:     Cervical back: Tenderness present.     Comments: trp's in neck/sholders and upper back and pecs- Tenderness along posterior and right neck, trap. Right shoulder with generalized tenderness to palpation and attempts at active/passive ER/IR/ABD  Skin:    Comments: Prevena vac in place on upper thoracic incision. No fluid in cannister.   Neurological:     Mental Status: He is alert.     Comments: Alert and oriented x 3. Normal insight and awareness. Intact Memory. Normal  language and speech. Cranial nerve exam unremarkable. MMT: RUE limited by pain in right shoulder. LUE is 5/5 prox to distal. BLE: 4-/5 HF, 4/5 KE and 4+/5 ADF/PF. Sl sensory loss to LT below level of injury, but sensation is near normal. DTR's 1+. No abnl resting tone appreciated. .  Assessment/Plan: 1. Functional deficits which require 3+ hours per day of interdisciplinary therapy in a comprehensive inpatient rehab setting. Physiatrist is providing close team supervision and 24 hour management of active medical problems listed below. Physiatrist and rehab team continue to assess barriers to discharge/monitor patient progress toward functional and medical goals  Care Tool:  Bathing    Body parts bathed by patient: Right arm, Chest, Abdomen, Face   Body parts bathed by helper: Front perineal area, Buttocks, Left arm, Right lower leg, Left lower leg, Right upper leg, Left upper leg     Bathing assist Assist Level: Total Assistance - Patient < 25%     Upper Body Dressing/Undressing Upper body dressing   What is the patient wearing?: Hospital gown only    Upper body assist Assist Level: Total Assistance - Patient < 25%    Lower Body Dressing/Undressing Lower body dressing  What is the patient wearing?: Incontinence brief, Pants     Lower body assist Assist for lower body dressing: 2 Helpers     Toileting Toileting    Toileting assist Assist for toileting: 2 Helpers     Transfers Chair/bed transfer  Transfers assist     Chair/bed transfer assist level: Dependent - mechanical lift     Locomotion Ambulation   Ambulation assist   Ambulation activity did not occur: Safety/medical concerns          Walk 10 feet activity   Assist  Walk 10 feet activity did not occur: Safety/medical concerns        Walk 50 feet activity   Assist Walk 50 feet with 2 turns activity did not occur: Safety/medical concerns         Walk 150 feet activity   Assist  Walk 150 feet activity did not occur: Safety/medical concerns         Walk 10 feet on uneven surface  activity   Assist Walk 10 feet on uneven surfaces activity did not occur: Safety/medical concerns         Wheelchair     Assist Is the patient using a wheelchair?: Yes Type of Wheelchair: Manual    Wheelchair assist level: Dependent - Patient 0%      Wheelchair 50 feet with 2 turns activity    Assist        Assist Level: Dependent - Patient 0%   Wheelchair 150 feet activity     Assist      Assist Level: Dependent - Patient 0%   Blood pressure (!) 144/88, pulse (!) 103, temperature 98.9 F (37.2 C), temperature source Oral, resp. rate 16, height 5' 9 (1.753 m), weight (!) 145 kg, SpO2 97%.  Medical Problem List and Plan: 1. 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 Has appt 2.  Antithrombotics/ RUL PE:             -continue eliquis . Monitor CBC             -Can use TEDS for swelling if needed             -antiplatelet therapy: n/a   3. Pain Management: Scheduled Tylenol , Flexeril , Robaxin  -palliative care recommended increasing oxyContin   to 100 mg q8.  -will stay at 90mg  q8 for now.  - oxycodone  20mg  q4 prn for breakthrough -Palliative care helping with pain management.    11/25- increased oxycontin  to 100 mg TID per palliative note; and increased robaxin  to 750 mg QID- per nursing, sleepy, but appeared to be able to participate in therapy  TRP injections today- pain down to 7/10 within 1 minute after injections- really helped per pt  11/26- pt's pain is doing somewhat better- is more  awake today as well   11/27- will see if removing VAC will help pain- it frequently does- if not, will call Palliative care tomorrow to discuss pain med regimen  11/28- to remove today 4. Mood/Behavior/Sleep: LCSW to follow for evaluation and support when available.  -antipsychotic agents: N/A             -sleep:    5. Neuropsych/cognition: This patient is capable of making decisions on his own behalf.   6. Skin/Wound Care: Routine pressure relief measures. Monitor post op incision for signs of infection. -Prevena in place-remove 11/27  - Remove sutures 3 weeks postop-11/30.    11/26- will remove Prevena in AM  11/28- wrote to remove today 7. Fluids/Electrolytes/Nutrition: Monitor I&O and weight. Follow up labs CBC/CMP     8.  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  Completed course of dexamethasone .  Patient to follow up outpatient with ENT, endocrinology and radiation oncology.    11.  Fever/Leukocytosis: Felt to be multifactorial and due to PE. Steroids course completed. Monitor CBC and vitals for fever on  Tylenol  1000 mg Q8h.    11/25- T 99.5 overnight, but WBC back down to normal range- con't to monitor  11/27- reordered labs for Monday  11/28- no more temps-  12. Hypomagnesemia: Mag 1.7, continue mag supplement.   11/28- will recheck Monday 13.  ABLA: Post procedure EBL 1500 mL, received 2 units PRBC.  Hemoglobin stable.   11/27- labs Monday 14.  Right shoulder pain, chronic: Per orthopedics PA follow-up outpatient for suspected rotator cuff pathology.   11/25- In sling for R shoulder pain-   11/26- thought to be RTC injury  11/27- will get MRI since affecting pt's ability to participate in therapy  11/28- not read yet- will con't to follow 14.  Constipation:  LBM 11/24.              -pt is having gas and is sensing when he needs to have bm             -has generally been continent             -stay aggressive with bowel regimen given the amount of  opiates he's on.  -bid miralax  and senokot-s 2 tabs bid              -prn sorbitol , dulcolax   11/25- Bowels loose and incontinent- not clear if due to loose stools or need for bowel program- will montor  11/26- Had BM yesterday on toilet  11/27- LBM 2 days ago- if doesn't have 1 by tomorrow, will need to intervene  11/28- LBM yesterday 15. Polysubstance Abuse: Continue discussion of cessation and risk factors.    11/25- will need to see Palliative care after d/c for pain meds- due to high doses-    11/28- if needs increase in pain meds, will call Palliative to see 16. Neurogenic Bladder:             -pt has been emptying bladder, typically continent             - check a few PVR's   11/25- bladder scan 52 overnight- don't see any from today- will f/u with nursing   11/26 Upper traps B/L x2- a lot of twitches Levators B/L  Posterior scalenes Middle scalenes- B/L - a lot of twitch responses Splenius Capitus- more laterally- B/L Pectoralis Major Rhomboids Infraspinatus Teres Major/minor Thoracic paraspinals Lumbar paraspinals Other injections-     I spent a total of 41   minutes on total care today- >50% coordination of care- due to  D/w nursing about VAC- and getting removed today- also review of R shoulder  MRI independently, appears to have a tear?  but need a read by radiology.  Also d/w pt about pain- wait on calling palliative care for pain med titration  LOS: 4 days A FACE TO FACE EVALUATION WAS PERFORMED  Maddelyn Rocca 05/20/2024, 9:36 AM

## 2024-05-20 NOTE — H&P (View-Only) (Signed)
 Joseph [name] for Consult:right shoulder pain Referring Physician: Lovorn  Joseph Joseph [name] is Joseph 43 y.o. Joseph [name].  HPI: Joseph Joseph [name] is a 43 year old Joseph [name] who presents to ortho with Joseph chief complaint of right shoulder pain. He was admitted to [facility] hospital about 3 weeks ago following acute loss of LE function. He reported severe pain in Joseph abdomen and legs with poor motor function. Multiple metastatic lesions found on various scans with possible primary lesion in Joseph thyroid . He underwent a C7-T3 fusion about 2 weeks ago. He reports that he has not talked to anyone from oncology yet. He states that right shoulder started bothering him in Joseph last few days. No specific injuries to Joseph shoulder. XR and MRI show lytic lesion involving Joseph surgical neck of Joseph right proximal humerus with pathologic fracture. Ortho called for consult.  Past Medical History:  Diagnosis Date   Anxiety    Asthma    as a child   Depression    Narcotic abuse (HCC)    Pulmonary nodules 04/29/2024    Past Surgical History:  Procedure Laterality Date   CLOSED REDUCTION FINGER WITH PERCUTANEOUS PINNING Right 06/22/2013   Procedure: RIGHT HAND SMALL FINGER CLOSED MANIPULATION AND PINNING;  Surgeon: Prentice Joseph Pagan, MD;  Location: [facility] OR;  Service: Orthopedics;  Laterality: Right;   I & D EXTREMITY  03/24/2012   Procedure: IRRIGATION AND DEBRIDEMENT EXTREMITY;  Surgeon: Joseph JAYSON Herald, MD;  Location: [facility] OR;  Service: Orthopedics;  Laterality: Right;  Right Forearm   POSTERIOR CERVICAL FUSION/FORAMINOTOMY N/A 05/05/2024   Procedure: POSTERIOR CERVICAL FUSION CERVICAL SEVEN-THORACIC ONE, THORACIC ONE-THORACIC TWO, THORACIC TWO-THORACIC THREE ,FOR RESECTION OF TUMOR THORACIC TWO CORPECTOMY;  Surgeon: Joseph Dorn SAUNDERS, MD;  Location: [facility] OR;  Service: Neurosurgery;  Laterality: N/A;    Family History  Problem Relation Age of Onset   Other Other    Alcohol abuse Other     Social History:  reports that he has been smoking cigarettes.  He has a 10 pack-year smoking history. He has never used smokeless tobacco. He reports that he does not currently use alcohol. He reports current drug use. Drug: Marijuana.  Allergies: No Known Allergies  Medications: I have reviewed Joseph Joseph [name]'s current medications.  No results found for this or any previous visit (from Joseph past 48 hours).  DG Humerus Right Result Date: 05/20/2024 EXAM: 1 VIEW(S) XRAY OF Joseph RIGHT HUMERUS 05/20/2024 01:46:00 PM COMPARISON: MRI shoulder 05/19/2024 and right shoulder radiograph 05/01/2024. CLINICAL HISTORY: Right shoulder pain. FINDINGS: BONES AND JOINTS: Lytic lesion involving Joseph surgical neck of Joseph right proximal humerus with pathologic fracture. Mild displacement. Periosteal reaction at Joseph fracture site. Joseph glenohumeral and acromioclavicular joints and elbow are approximated. No joint dislocation. SOFT TISSUES: Numerous linear metallic densities in Joseph soft tissues of Joseph right upper arm and distal arm compatible with needle fragments. Soft tissue swelling surrounding Joseph proximal humerus. Areas of pneumatosis in Joseph soft tissues. IMPRESSION: 1. Lytic lesion involving Joseph surgical neck of Joseph right proximal humerus with pathologic fracture. Mild displacement is new since Joseph prior radiograph. 2. Soft tissue swelling surrounding Joseph proximal humerus with areas of soft tissue swelling and periosteal reaction at Joseph fracture site. 3. Numerous linear metallic densities in Joseph soft tissues of Joseph right upper arm and distal arm compatible with needle fragments. Electronically signed by: Donnice Mania MD 05/20/2024 02:06 PM EST RP Workstation: [id number]   MR SHOULDER RIGHT WO CONTRAST Result Date: 05/20/2024 EXAM: MRI OF Joseph RIGHT SHOULDER  WITHOUT CONTRAST 05/19/2024 12:04:39 PM TECHNIQUE: Multiplanar multisequence MRI of Joseph right shoulder was performed without Joseph administration of intravenous contrast. COMPARISON: Radiograph 05/01/2024. CLINICAL HISTORY: Chronic  shoulder pain, paraplegia related to metastatic papillary thyroid  carcinoma to Joseph thoracic spine. FINDINGS: ROTATOR CUFF: Intact supraspinatus, infraspinatus, subscapularis and teres minor tendons. Edema signal in Joseph teres minor muscle. No significant muscle atrophy. BICEPS TENDON: Long head biceps tendon is intact and normally located. LABRUM: No gross labral tear or paralabral cyst; motion artifact precludes sensitive assessment of Joseph labrum. GLENOHUMERAL JOINT: Physiologic amount of joint fluid. No high-grade cartilage loss. Normal alignment. AC JOINT AND ACROMIOCLAVICULAR ARCH: No significant acromial downsloping or subacromial spur. No significant degenerative changes. Intact acromioclavicular and coracoclavicular ligaments. BURSA: No significant subacromial/subdeltoid bursitis. BONE MARROW: Pathologic transverse fracture of Joseph proximal humerus just below Joseph surgical neck with moderate apex anterolateral angulation associated with a 6.0 x 3.7 x 4.1 cm metastatic lesion of Joseph right proximal humerus. Joseph lesion is mildly expansile and has high T2 and low T1 signal in a relatively homogeneous pattern. OUTLET SPACES: Normal MRI appearance of Joseph quadrilateral space. No significant narrowing of Joseph supraspinatus outlet. SOFT TISSUES: Edema tracks along fascial planes around Joseph fracture site. Edema signal proximally in Joseph long head of Joseph triceps. Nonspecific edema tracks along Joseph superficial fascial margin of Joseph posterolateral deltoid. LIMITATIONS/ARTIFACTS: Motion artifact is present, reducing diagnostic sensitivity and specificity. IMPRESSION: 1. Pathologic transverse fracture of Joseph proximal humerus just below Joseph surgical neck with moderate apex anterolateral angulation, associated with a 6.0 x 3.7 x 4.1 cm metastatic lesion of Joseph right proximal humerus. Edema tracks along fascial planes around Joseph fracture site. 2. Edema signal in Joseph teres minor muscle and proximally in Joseph long head of Joseph triceps.  3. Nonspecific edema along Joseph superficial fascial margin of Joseph posterolateral deltoid. Electronically signed by: Ryan Salvage MD 05/20/2024 10:36 AM EST RP Workstation: [id number]    Review of Systems  Constitutional:  Negative for chills and fever.  Respiratory:  Negative for shortness of breath.   Cardiovascular:  Negative for chest pain.  Gastrointestinal:  Negative for abdominal pain.  Musculoskeletal:  Positive for arthralgias, back pain, gait problem and neck pain.  Neurological:  Positive for weakness and numbness.  Psychiatric/Behavioral:  Negative for agitation and behavioral problems.    Blood pressure (!) 136/90, pulse (!) 103, temperature 98.9 F (37.2 C), temperature source Oral, resp. rate 16, height 5' 9 (1.753 m), weight (!) 145 kg, SpO2 97%. Physical Exam Constitutional:      Appearance: Normal appearance. He is obese.  HENT:     Head: Normocephalic and atraumatic.  Eyes:     Extraocular Movements: Extraocular movements intact.  Cardiovascular:     Rate and Rhythm: Normal rate.  Pulmonary:     Effort: No respiratory distress.  Musculoskeletal:     Comments: RUE in sling. Joseph [name] about to move right fingers, hand, and wrist without difficulty. No open wounds or abrasions about Joseph right shoulder or arm. Tender to palpation diffusely about Joseph right shoulder and humerus. Distally neurovascularly intact in RUE.   Skin:    Capillary Refill: Capillary refill takes less than 2 seconds.  Neurological:     Mental Status: He is alert and oriented to person, place, and time. Mental status is at baseline.  Psychiatric:        Mood and Affect: Mood normal.        Behavior: Behavior normal.     Assessment/Plan: Lytic lesion involving  Joseph surgical neck of Joseph right proximal humerus with pathologic fracture- plan for OR for intramedullary nail for fixation of Joseph fracture site. Will likely obtain biopsy at Joseph time of surgery. Discussed Joseph procedure with Joseph Joseph [name]  as well as expected postoperative course, risks, benefits. Joseph [name] elected to proceed. Possible for surgery tomorrow. Plan for OR after midnight.   Shawndra Clute L. [place], PA-C 05/20/2024, 2:47 PM

## 2024-05-20 NOTE — Progress Notes (Signed)
 Call placed to [facility] to provide report to pt prior to D/C writer unable to speak to receiving nurse.

## 2024-05-20 NOTE — Consult Note (Signed)
 Reason for Consult:right shoulder pain Referring Physician: Lovorn  Joseph [name] is an 43 y.o. male.  HPI: The patient is a 43 year old male who presents to ortho with the chief complaint of right shoulder pain. He was admitted to the hospital about 3 weeks ago following acute loss of LE function. He reported severe pain in the abdomen and legs with poor motor function. Multiple metastatic lesions found on various scans with possible primary lesion in the thyroid . He underwent a C7-T3 fusion about 2 weeks ago. He reports that he has not talked to anyone from oncology yet. He states that right shoulder started bothering him in the last few days. No specific injuries to the shoulder. XR and MRI show lytic lesion involving the surgical neck of the right proximal humerus with pathologic fracture. Ortho called for consult.  Past Medical History:  Diagnosis Date   Anxiety    Asthma    as a child   Depression    Narcotic abuse (HCC)    Pulmonary nodules 04/29/2024    Past Surgical History:  Procedure Laterality Date   CLOSED REDUCTION FINGER WITH PERCUTANEOUS PINNING Right 06/22/2013   Procedure: RIGHT HAND SMALL FINGER CLOSED MANIPULATION AND PINNING;  Surgeon: Prentice LELON Pagan, MD;  Location: [facility] OR;  Service: Orthopedics;  Laterality: Right;   I & D EXTREMITY  03/24/2012   Procedure: IRRIGATION AND DEBRIDEMENT EXTREMITY;  Surgeon: Oneil JAYSON Herald, MD;  Location: [facility] OR;  Service: Orthopedics;  Laterality: Right;  Right Forearm   POSTERIOR CERVICAL FUSION/FORAMINOTOMY N/A 05/05/2024   Procedure: POSTERIOR CERVICAL FUSION CERVICAL SEVEN-THORACIC ONE, THORACIC ONE-THORACIC TWO, THORACIC TWO-THORACIC THREE ,FOR RESECTION OF TUMOR THORACIC TWO CORPECTOMY;  Surgeon: Darnella Dorn SAUNDERS, MD;  Location: [facility] OR;  Service: Neurosurgery;  Laterality: N/A;    Family History  Problem Relation Age of Onset   Other Other    Alcohol abuse Other     Social History:  reports that he has been smoking cigarettes.  He has a 10 pack-year smoking history. He has never used smokeless tobacco. He reports that he does not currently use alcohol. He reports current drug use. Drug: Marijuana.  Allergies: No Known Allergies  Medications: I have reviewed the patient's current medications.  No results found for this or any previous visit (from the past 48 hours).  DG Humerus Right Result Date: 05/20/2024 EXAM: 1 VIEW(S) XRAY OF THE RIGHT HUMERUS 05/20/2024 01:46:00 PM COMPARISON: MRI shoulder 05/19/2024 and right shoulder radiograph 05/01/2024. CLINICAL HISTORY: Right shoulder pain. FINDINGS: BONES AND JOINTS: Lytic lesion involving the surgical neck of the right proximal humerus with pathologic fracture. Mild displacement. Periosteal reaction at the fracture site. The glenohumeral and acromioclavicular joints and elbow are approximated. No joint dislocation. SOFT TISSUES: Numerous linear metallic densities in the soft tissues of the right upper arm and distal arm compatible with needle fragments. Soft tissue swelling surrounding the proximal humerus. Areas of pneumatosis in the soft tissues. IMPRESSION: 1. Lytic lesion involving the surgical neck of the right proximal humerus with pathologic fracture. Mild displacement is new since the prior radiograph. 2. Soft tissue swelling surrounding the proximal humerus with areas of soft tissue swelling and periosteal reaction at the fracture site. 3. Numerous linear metallic densities in the soft tissues of the right upper arm and distal arm compatible with needle fragments. Electronically signed by: Donnice Mania MD 05/20/2024 02:06 PM EST RP Workstation: [id number]   MR SHOULDER RIGHT WO CONTRAST Result Date: 05/20/2024 EXAM: MRI OF THE RIGHT SHOULDER  WITHOUT CONTRAST 05/19/2024 12:04:39 PM TECHNIQUE: Multiplanar multisequence MRI of the right shoulder was performed without the administration of intravenous contrast. COMPARISON: Radiograph 05/01/2024. 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 FINDINGS: ROTATOR CUFF: Intact supraspinatus, infraspinatus, subscapularis and teres minor tendons. Edema signal in the teres minor muscle. No significant muscle atrophy. BICEPS TENDON: Long head biceps tendon is intact and normally located. LABRUM: No gross labral tear or paralabral cyst; motion artifact precludes sensitive assessment of the labrum. GLENOHUMERAL JOINT: Physiologic amount of joint fluid. No high-grade cartilage loss. Normal alignment. AC JOINT AND ACROMIOCLAVICULAR ARCH: No significant acromial downsloping or subacromial spur. No significant degenerative changes. Intact acromioclavicular and coracoclavicular ligaments. BURSA: No significant subacromial/subdeltoid bursitis. BONE MARROW: Pathologic transverse fracture of the proximal humerus just below the surgical neck with moderate apex anterolateral angulation associated with a 6.0 x 3.7 x 4.1 cm metastatic lesion of the right proximal humerus. The lesion is mildly expansile and has high T2 and low T1 signal in a relatively homogeneous pattern. OUTLET SPACES: Normal MRI appearance of the quadrilateral space. No significant narrowing of the supraspinatus outlet. SOFT TISSUES: Edema tracks along fascial planes around the fracture site. Edema signal proximally in the long head of the triceps. Nonspecific edema tracks along the superficial fascial margin of the posterolateral deltoid. LIMITATIONS/ARTIFACTS: Motion artifact is present, reducing diagnostic sensitivity and specificity. IMPRESSION: 1. Pathologic transverse fracture of the proximal humerus just below the surgical neck with moderate apex anterolateral angulation, associated with a 6.0 x 3.7 x 4.1 cm metastatic lesion of the right proximal humerus. Edema tracks along fascial planes around the fracture site. 2. Edema signal in the teres minor muscle and proximally in the long head of the triceps.  3. Nonspecific edema along the superficial fascial margin of the posterolateral deltoid. Electronically signed by: Ryan Salvage MD 05/20/2024 10:36 AM EST RP Workstation: [id number]    Review of Systems  Constitutional:  Negative for chills and fever.  Respiratory:  Negative for shortness of breath.   Cardiovascular:  Negative for chest pain.  Gastrointestinal:  Negative for abdominal pain.  Musculoskeletal:  Positive for arthralgias, back pain, gait problem and neck pain.  Neurological:  Positive for weakness and numbness.  Psychiatric/Behavioral:  Negative for agitation and behavioral problems.    Blood pressure (!) 136/90, pulse (!) 103, temperature 98.9 F (37.2 C), temperature source Oral, resp. rate 16, height 5' 9 (1.753 m), weight (!) 145 kg, SpO2 97%. Physical Exam Constitutional:      Appearance: Normal appearance. He is obese.  HENT:     Head: Normocephalic and atraumatic.  Eyes:     Extraocular Movements: Extraocular movements intact.  Cardiovascular:     Rate and Rhythm: Normal rate.  Pulmonary:     Effort: No respiratory distress.  Musculoskeletal:     Comments: RUE in sling. Patient about to move right fingers, hand, and wrist without difficulty. No open wounds or abrasions about the right shoulder or arm. Tender to palpation diffusely about the right shoulder and humerus. Distally neurovascularly intact in RUE.   Skin:    Capillary Refill: Capillary refill takes less than 2 seconds.  Neurological:     Mental Status: He is alert and oriented to person, place, and time. Mental status is at baseline.  Psychiatric:        Mood and Affect: Mood normal.        Behavior: Behavior normal.     Assessment/Plan: Lytic lesion involving  the surgical neck of the right proximal humerus with pathologic fracture- plan for OR for intramedullary nail for fixation of the fracture site. Will likely obtain biopsy at the time of surgery. Discussed the procedure with the patient  as well as expected postoperative course, risks, benefits. Patient elected to proceed. Possible for surgery tomorrow. Plan for OR after midnight.   Shawndra Clute L. [place], PA-C 05/20/2024, 2:47 PM

## 2024-05-20 NOTE — Plan of Care (Signed)
  Problem: Consults Goal: RH SPINAL CORD INJURY PATIENT EDUCATION Description:  See Patient Education module for education specifics.  Outcome: Progressing   Problem: SCI BOWEL ELIMINATION Goal: RH STG MANAGE BOWEL WITH ASSISTANCE Description: STG Manage Bowel with supevision - min assist  Assistance. Outcome: Progressing   Problem: SCI BLADDER ELIMINATION Goal: RH STG MANAGE BLADDER WITH ASSISTANCE Description: STG Manage Bladder With supervision- min Assistance Outcome: Progressing   Problem: RH SKIN INTEGRITY Goal: RH STG SKIN FREE OF INFECTION/BREAKDOWN Description: Manage skin free of infection with supervision- min assistance Outcome: Progressing   Problem: RH SAFETY Goal: RH STG ADHERE TO SAFETY PRECAUTIONS W/ASSISTANCE/DEVICE Description: STG Adhere to Safety Precautions With supervision - min Assistance/Device. Outcome: Progressing   Problem: RH PAIN MANAGEMENT Goal: RH STG PAIN MANAGED AT OR BELOW PT'S PAIN GOAL Description: <4 w/ prns Outcome: Progressing   Problem: RH KNOWLEDGE DEFICIT SCI Goal: RH STG INCREASE KNOWLEDGE OF SELF CARE AFTER SCI Description: Manage increase  knowledge of self care after SCI with supervision- min assistance from family using educational materials provided Outcome: Progressing
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
## 2024-05-20 NOTE — Progress Notes (Signed)
 Occupational Therapy Session Note  Patient Details  Name: Joseph [name] MRN: [id number] Date of Birth: 02/04/1981  Today's Date: 05/20/2024 OT Individual Time: 9199-9084 OT Individual Time Calculation (min): 75 min    Short Term Goals: Week 1:  OT Short Term Goal 1 (Week 1): Pt will improve sit <> stands in stedy with CGA OT Short Term Goal 2 (Week 1): Pt will perform toileting in most appropriate environment with MAX A OT Short Term Goal 3 (Week 1): Pt will improve sitting balance to sit EOB for 5 mins while performing ADL of choice   Skilled Therapeutic Interventions/Progress Updates:    1:1 Pt received in the bed. Performed bathing at bed level with focus on rolling, attempts to bridge, mobility in LEs to help OT with threading pants. ACe wrapped LEs. Pt came to EOB with max A +2. Pt required A to maintain sitting balance with left Ue support with min to mod A to maintain. PT required max A to don shirt and total A to don pants.  Education on slide board transfer with head hip relationship. Pt required max A +2 to scoot to the right into the chair. Pt continues to guard his right UE and does not use it functional in ADL. At the sink with setup pt was able to perform oral care.   Pt left sitting up in TIS chair with slight tilt with call bell at hand.   Therapy Documentation Precautions:  Precautions Precautions: Fall, Back Precaution Booklet Issued: No Recall of Precautions/Restrictions: Intact Precaution/Restrictions Comments: painful R shoulder, present prior to admission but pt states worse now Required Braces or Orthoses: Sling Restrictions Weight Bearing Restrictions Per Provider Order: Yes RUE Weight Bearing Per Provider Order: Non weight bearing RLE Weight Bearing Per Provider Order: Non weight bearing LLE Weight Bearing Per Provider Order: Non weight bearing Other Position/Activity Restrictions: Dr. Darnella via secure chat 11/17 - WBAT all extremities. NWB through RUE  due to pain.   Pain: Pain Assessment Pain Scale: 0-10 Pain Score: 10-Worst pain ever Pain Location: Generalized Pain Intervention(s): Medication (See eMAR);Repositioned;Relaxation; rest breaks as needed.    Therapy/Group: Individual Therapy  Claudene Nest [facility] 05/20/2024, 11:59 AM

## 2024-05-20 NOTE — Progress Notes (Addendum)
 MRI shows a large tumor with associated pathological fracture of proximal right humerus! Called ortho- they want to take for IM nail- also spoke with Oncology- Dr Lanny mentioned could have radiation for pain control if need be- if needed, call Dr Lanny over the weekend. Dr Laurence is his assigned Oncologist and will be back Monday. I think oncology might need to assess before we bring back to CIR since will likely be NWB on rue as well as incomplete paraplegic - which makes it hard to progress. And see what the plan is overall. He might need admission to [facility] for treatment/ endo will need to assist per Dr Lanny. Will find out from Ortho when to transfer to acute  Spoke to IM- they will get him moved to Acute hospital- with probable plan for surgery tomorrow- and mgmt from hospitalist.

## 2024-05-20 NOTE — Progress Notes (Signed)
 Physical Therapy Note  Patient Details  Name: Joseph [name] MRN: [id number] Date of Birth: 22-Nov-1980 Today's Date: 05/20/2024    Pt missed 45 min of skilled PT due to being on medical hold due to new fx - pending possible transfer back to acute.    [name] [name], PT, DPT, CBIS  05/20/2024, 2:00 PM

## 2024-05-20 NOTE — Progress Notes (Signed)
   05/20/24 1126  Spiritual Encounters  Type of Visit Initial  Care provided to: Patient  Conversation partners present during encounter Nurse  Reason for visit Advance directives  OnCall Visit No   This chaplain visited the patient. Educated him on the need for an advance directive to be in place. He said that he had already had a conversation with his family about primary and secondary and they would be filling out the document later. He knows to have the nurse page the chaplain when ready to finalize it.

## 2024-05-20 NOTE — Progress Notes (Signed)
 Occupational Therapy Discharge Note  This patient was unable to complete the inpatient rehab program due to new fx with plan to go to acute for surgical intervention; therefore did not meet their long term goals. Pt left the program at a total A +2 assist level for their  functional ADLs. This patient is being discharged from OT services at this time.  BIMS at time of d/c  Pt unable to complete due to medical status  See CareTool for functional status details.  If the patient is able to return to inpatient rehabilitation within 3 midnights, this may be considered an interrupted stay and therapy services will resume as ordered. Modification and reinstatement of their goals will be made upon completion of therapy service reevaluations.

## 2024-05-21 ENCOUNTER — Inpatient Hospital Stay (HOSPITAL_COMMUNITY): Payer: MEDICAID | Admitting: Certified Registered"

## 2024-05-21 ENCOUNTER — Inpatient Hospital Stay (HOSPITAL_COMMUNITY): Payer: MEDICAID

## 2024-05-21 ENCOUNTER — Inpatient Hospital Stay (HOSPITAL_COMMUNITY)
Admission: EM | Admit: 2024-05-21 | Discharge: 2024-06-02 | DRG: 478 | Disposition: A | Discharge status: Rehabilitation Facility | Payer: MEDICAID | Attending: Family Medicine | Admitting: Family Medicine

## 2024-05-21 ENCOUNTER — Encounter (HOSPITAL_COMMUNITY)
Admission: EM | Disposition: A | Discharge status: Rehabilitation Facility | Payer: Self-pay | Source: Home / Self Care | Attending: Family Medicine

## 2024-05-21 ENCOUNTER — Encounter (HOSPITAL_COMMUNITY): Payer: Self-pay | Admitting: Family Medicine

## 2024-05-21 DIAGNOSIS — M5127 Other intervertebral disc displacement, lumbosacral region: Secondary | ICD-10-CM | POA: Diagnosis present

## 2024-05-21 DIAGNOSIS — Z515 Encounter for palliative care: Secondary | ICD-10-CM | POA: Diagnosis not present

## 2024-05-21 DIAGNOSIS — Z7901 Long term (current) use of anticoagulants: Secondary | ICD-10-CM

## 2024-05-21 DIAGNOSIS — D649 Anemia, unspecified: Secondary | ICD-10-CM | POA: Diagnosis not present

## 2024-05-21 DIAGNOSIS — M8448XD Pathological fracture, other site, subsequent encounter for fracture with routine healing: Secondary | ICD-10-CM | POA: Diagnosis present

## 2024-05-21 DIAGNOSIS — Z981 Arthrodesis status: Secondary | ICD-10-CM

## 2024-05-21 DIAGNOSIS — E8809 Other disorders of plasma-protein metabolism, not elsewhere classified: Secondary | ICD-10-CM | POA: Diagnosis present

## 2024-05-21 DIAGNOSIS — D696 Thrombocytopenia, unspecified: Secondary | ICD-10-CM | POA: Diagnosis present

## 2024-05-21 DIAGNOSIS — D630 Anemia in neoplastic disease: Secondary | ICD-10-CM | POA: Diagnosis present

## 2024-05-21 DIAGNOSIS — K592 Neurogenic bowel, not elsewhere classified: Secondary | ICD-10-CM | POA: Diagnosis present

## 2024-05-21 DIAGNOSIS — F1910 Other psychoactive substance abuse, uncomplicated: Secondary | ICD-10-CM | POA: Diagnosis present

## 2024-05-21 DIAGNOSIS — C73 Malignant neoplasm of thyroid gland: Principal | ICD-10-CM | POA: Diagnosis present

## 2024-05-21 DIAGNOSIS — M84421A Pathological fracture, right humerus, initial encounter for fracture: Principal | ICD-10-CM | POA: Diagnosis present

## 2024-05-21 DIAGNOSIS — R5381 Other malaise: Secondary | ICD-10-CM | POA: Diagnosis present

## 2024-05-21 DIAGNOSIS — M84521A Pathological fracture in neoplastic disease, right humerus, initial encounter for fracture: Secondary | ICD-10-CM

## 2024-05-21 DIAGNOSIS — C7802 Secondary malignant neoplasm of left lung: Secondary | ICD-10-CM | POA: Diagnosis present

## 2024-05-21 DIAGNOSIS — W19XXXA Unspecified fall, initial encounter: Secondary | ICD-10-CM | POA: Diagnosis present

## 2024-05-21 DIAGNOSIS — F17210 Nicotine dependence, cigarettes, uncomplicated: Secondary | ICD-10-CM

## 2024-05-21 DIAGNOSIS — M25511 Pain in right shoulder: Secondary | ICD-10-CM | POA: Diagnosis present

## 2024-05-21 DIAGNOSIS — R52 Pain, unspecified: Secondary | ICD-10-CM | POA: Diagnosis not present

## 2024-05-21 DIAGNOSIS — F1110 Opioid abuse, uncomplicated: Secondary | ICD-10-CM | POA: Diagnosis present

## 2024-05-21 DIAGNOSIS — E66813 Obesity, class 3: Secondary | ICD-10-CM | POA: Diagnosis present

## 2024-05-21 DIAGNOSIS — M5106 Intervertebral disc disorders with myelopathy, lumbar region: Secondary | ICD-10-CM | POA: Diagnosis present

## 2024-05-21 DIAGNOSIS — F32A Depression, unspecified: Secondary | ICD-10-CM | POA: Diagnosis present

## 2024-05-21 DIAGNOSIS — F1420 Cocaine dependence, uncomplicated: Secondary | ICD-10-CM

## 2024-05-21 DIAGNOSIS — R000 Tachycardia, unspecified: Secondary | ICD-10-CM | POA: Diagnosis not present

## 2024-05-21 DIAGNOSIS — J45909 Unspecified asthma, uncomplicated: Secondary | ICD-10-CM | POA: Diagnosis present

## 2024-05-21 DIAGNOSIS — F1220 Cannabis dependence, uncomplicated: Secondary | ICD-10-CM

## 2024-05-21 DIAGNOSIS — Z7189 Other specified counseling: Secondary | ICD-10-CM | POA: Diagnosis not present

## 2024-05-21 DIAGNOSIS — Z789 Other specified health status: Secondary | ICD-10-CM | POA: Diagnosis not present

## 2024-05-21 DIAGNOSIS — E871 Hypo-osmolality and hyponatremia: Secondary | ICD-10-CM | POA: Diagnosis not present

## 2024-05-21 DIAGNOSIS — Z6841 Body Mass Index (BMI) 40.0 and over, adult: Secondary | ICD-10-CM | POA: Diagnosis not present

## 2024-05-21 DIAGNOSIS — K5900 Constipation, unspecified: Secondary | ICD-10-CM | POA: Diagnosis present

## 2024-05-21 DIAGNOSIS — G8222 Paraplegia, incomplete: Secondary | ICD-10-CM | POA: Diagnosis present

## 2024-05-21 DIAGNOSIS — Z86711 Personal history of pulmonary embolism: Secondary | ICD-10-CM

## 2024-05-21 DIAGNOSIS — C7951 Secondary malignant neoplasm of bone: Secondary | ICD-10-CM | POA: Diagnosis present

## 2024-05-21 DIAGNOSIS — F1990 Other psychoactive substance use, unspecified, uncomplicated: Secondary | ICD-10-CM | POA: Diagnosis not present

## 2024-05-21 DIAGNOSIS — F419 Anxiety disorder, unspecified: Secondary | ICD-10-CM | POA: Diagnosis present

## 2024-05-21 DIAGNOSIS — J398 Other specified diseases of upper respiratory tract: Secondary | ICD-10-CM | POA: Diagnosis present

## 2024-05-21 DIAGNOSIS — I2699 Other pulmonary embolism without acute cor pulmonale: Secondary | ICD-10-CM | POA: Diagnosis present

## 2024-05-21 DIAGNOSIS — N319 Neuromuscular dysfunction of bladder, unspecified: Secondary | ICD-10-CM | POA: Diagnosis present

## 2024-05-21 DIAGNOSIS — G893 Neoplasm related pain (acute) (chronic): Secondary | ICD-10-CM | POA: Diagnosis present

## 2024-05-21 DIAGNOSIS — M4800 Spinal stenosis, site unspecified: Secondary | ICD-10-CM | POA: Diagnosis present

## 2024-05-21 DIAGNOSIS — M84521D Pathological fracture in neoplastic disease, right humerus, subsequent encounter for fracture with routine healing: Secondary | ICD-10-CM | POA: Diagnosis not present

## 2024-05-21 DIAGNOSIS — I272 Pulmonary hypertension, unspecified: Secondary | ICD-10-CM | POA: Diagnosis not present

## 2024-05-21 DIAGNOSIS — R531 Weakness: Secondary | ICD-10-CM | POA: Diagnosis not present

## 2024-05-21 DIAGNOSIS — F418 Other specified anxiety disorders: Secondary | ICD-10-CM | POA: Diagnosis not present

## 2024-05-21 DIAGNOSIS — C7801 Secondary malignant neoplasm of right lung: Secondary | ICD-10-CM | POA: Diagnosis present

## 2024-05-21 DIAGNOSIS — R0902 Hypoxemia: Secondary | ICD-10-CM | POA: Diagnosis not present

## 2024-05-21 DIAGNOSIS — Z7982 Long term (current) use of aspirin: Secondary | ICD-10-CM | POA: Diagnosis not present

## 2024-05-21 DIAGNOSIS — Z7952 Long term (current) use of systemic steroids: Secondary | ICD-10-CM

## 2024-05-21 DIAGNOSIS — Z79899 Other long term (current) drug therapy: Secondary | ICD-10-CM

## 2024-05-21 DIAGNOSIS — M84429A Pathological fracture, unspecified humerus, initial encounter for fracture: Secondary | ICD-10-CM | POA: Diagnosis present

## 2024-05-21 DIAGNOSIS — R509 Fever, unspecified: Secondary | ICD-10-CM | POA: Diagnosis not present

## 2024-05-21 HISTORY — PX: HUMERUS IM NAIL: SHX1769

## 2024-05-21 LAB — TYPE AND SCREEN
ABO/RH(D): A POS
Antibody Screen: NEGATIVE

## 2024-05-21 LAB — BASIC METABOLIC PANEL WITH GFR
Anion gap: 7 (ref 5–15)
BUN: 13 mg/dL (ref 6–20)
CO2: 27 mmol/L (ref 22–32)
Calcium: 8.5 mg/dL — ABNORMAL LOW (ref 8.9–10.3)
Chloride: 100 mmol/L (ref 98–111)
Creatinine, Ser: 0.76 mg/dL (ref 0.61–1.24)
GFR, Estimated: >60 mL/min (ref >60)
Glucose, Bld: 129 mg/dL — ABNORMAL HIGH (ref 70–99)
Potassium: 4.2 mmol/L (ref 3.5–5.1)
Sodium: 134 mmol/L — ABNORMAL LOW (ref 135–145)

## 2024-05-21 LAB — CBC
HCT: 30.6 % — ABNORMAL LOW (ref 39.0–52.0)
Hemoglobin: 9.6 g/dL — ABNORMAL LOW (ref 13.0–17.0)
MCH: 29.5 pg (ref 26.0–34.0)
MCHC: 31.4 g/dL (ref 30.0–36.0)
MCV: 94.2 fL (ref 80.0–100.0)
Platelets: 106 K/uL — ABNORMAL LOW (ref 150–400)
RBC: 3.25 MIL/uL — ABNORMAL LOW (ref 4.22–5.81)
RDW: 13.6 % (ref 11.5–15.5)
WBC: 7.8 K/uL (ref 4.0–10.5)
nRBC: 0 % (ref 0.0–0.2)

## 2024-05-21 LAB — MAGNESIUM: Magnesium: 1.7 mg/dL (ref 1.7–2.4)

## 2024-05-21 SURGERY — INSERTION, INTRAMEDULLARY ROD, HUMERUS
Anesthesia: Regional | Laterality: Right

## 2024-05-21 MED ORDER — SENNOSIDES-DOCUSATE SODIUM 8.6-50 MG PO TABS
2.0000 | ORAL_TABLET | Freq: Two times a day (BID) | ORAL | Status: DC
  Administered 2024-05-21 – 2024-06-02 (×14): 2 via ORAL
  Filled 2024-05-21 (×19): qty 2

## 2024-05-21 MED ORDER — CEFAZOLIN SODIUM-DEXTROSE 3-4 GM/150ML-% IV SOLN
INTRAVENOUS | Status: AC
  Filled 2024-05-21: qty 150

## 2024-05-21 MED ORDER — DOCUSATE SODIUM 100 MG PO CAPS
100.0000 mg | ORAL_CAPSULE | Freq: Two times a day (BID) | ORAL | Status: DC
  Administered 2024-05-21 – 2024-05-22 (×3): 100 mg via ORAL
  Filled 2024-05-21 (×3): qty 1

## 2024-05-21 MED ORDER — CHLORHEXIDINE GLUCONATE 0.12 % MT SOLN
OROMUCOSAL | Status: AC
Start: 2024-05-21 — End: 2024-05-21
  Filled 2024-05-21: qty 15

## 2024-05-21 MED ORDER — MUPIROCIN 2 % EX OINT
1.0000 | TOPICAL_OINTMENT | Freq: Two times a day (BID) | CUTANEOUS | Status: AC
  Administered 2024-05-21 – 2024-05-25 (×9): 1 via NASAL
  Filled 2024-05-21: qty 22

## 2024-05-21 MED ORDER — FENTANYL CITRATE (PF) 250 MCG/5ML IJ SOLN
INTRAMUSCULAR | Status: DC | PRN
Start: 2024-05-21 — End: 2024-05-21
  Administered 2024-05-21: 50 ug via INTRAVENOUS

## 2024-05-21 MED ORDER — FENTANYL CITRATE (PF) 250 MCG/5ML IJ SOLN
INTRAMUSCULAR | Status: AC
  Filled 2024-05-21: qty 5

## 2024-05-21 MED ORDER — ORAL CARE MOUTH RINSE: 15.0000 mL | Freq: Once | OROMUCOSAL | Status: AC

## 2024-05-21 MED ORDER — POLYETHYLENE GLYCOL 3350 17 G PO PACK
17.0000 g | PACK | Freq: Two times a day (BID) | ORAL | Status: DC
  Administered 2024-05-21 – 2024-05-24 (×3): 17 g via ORAL
  Filled 2024-05-21 (×6): qty 1

## 2024-05-21 MED ORDER — OXYCODONE HCL 5 MG PO TABS
20.0000 mg | ORAL_TABLET | ORAL | Status: DC | PRN
  Administered 2024-05-21 – 2024-05-27 (×24): 20 mg via ORAL
  Filled 2024-05-21 (×27): qty 4

## 2024-05-21 MED ORDER — FENTANYL CITRATE (PF) 100 MCG/2ML IJ SOLN
INTRAMUSCULAR | Status: AC
  Filled 2024-05-21: qty 2

## 2024-05-21 MED ORDER — PHENYLEPHRINE HCL-NACL 20-0.9 MG/250ML-% IV SOLN
INTRAVENOUS | Status: DC | PRN
Start: 2024-05-21 — End: 2024-05-21
  Administered 2024-05-21: 65 ug/min via INTRAVENOUS
  Administered 2024-05-21: 40 ug/min via INTRAVENOUS
  Administered 2024-05-21: 25 ug/min via INTRAVENOUS

## 2024-05-21 MED ORDER — DROPERIDOL 2.5 MG/ML IJ SOLN: 0.6250 mg | Freq: Once | INTRAMUSCULAR | Status: DC | PRN

## 2024-05-21 MED ORDER — FENTANYL CITRATE (PF) 100 MCG/2ML IJ SOLN: 50.0000 ug | Freq: Once | INTRAMUSCULAR | Status: DC

## 2024-05-21 MED ORDER — METHOCARBAMOL 750 MG PO TABS
750.0000 mg | ORAL_TABLET | Freq: Three times a day (TID) | ORAL | Status: DC
  Administered 2024-05-21 – 2024-06-02 (×48): 750 mg via ORAL
  Filled 2024-05-21 (×50): qty 1

## 2024-05-21 MED ORDER — LIDOCAINE HCL (PF) 2 % IJ SOLN
INTRAMUSCULAR | Status: DC | PRN
  Administered 2024-05-21: 40 mg via INTRADERMAL

## 2024-05-21 MED ORDER — ALBUTEROL SULFATE (2.5 MG/3ML) 0.083% IN NEBU: 2.5000 mg | INHALATION_SOLUTION | Freq: Four times a day (QID) | RESPIRATORY_TRACT | Status: DC | PRN

## 2024-05-21 MED ORDER — FENTANYL CITRATE (PF) 100 MCG/2ML IJ SOLN
25.0000 ug | INTRAMUSCULAR | Status: DC | PRN
  Administered 2024-05-21: 50 ug via INTRAVENOUS

## 2024-05-21 MED ORDER — ACETAMINOPHEN 325 MG PO TABS
650.0000 mg | ORAL_TABLET | Freq: Four times a day (QID) | ORAL | Status: DC | PRN
  Administered 2024-05-21 – 2024-05-23 (×4): 650 mg via ORAL
  Filled 2024-05-21 (×4): qty 2

## 2024-05-21 MED ORDER — ONDANSETRON HCL 4 MG/2ML IJ SOLN: 4.0000 mg | Freq: Four times a day (QID) | INTRAMUSCULAR | Status: DC | PRN

## 2024-05-21 MED ORDER — STERILE WATER FOR IRRIGATION IR SOLN
Status: DC | PRN
  Administered 2024-05-21: 1000 mL

## 2024-05-21 MED ORDER — DROPERIDOL 2.5 MG/ML IJ SOLN
0.6250 mg | Freq: Once | INTRAMUSCULAR | Status: DC | PRN
Start: 2024-05-21 — End: 2024-05-21

## 2024-05-21 MED ORDER — CHLORHEXIDINE GLUCONATE 0.12 % MT SOLN
15.0000 mL | Freq: Once | OROMUCOSAL | Status: AC
  Administered 2024-05-21: 15 mL via OROMUCOSAL

## 2024-05-21 MED ORDER — LACTATED RINGERS IV SOLN: INTRAVENOUS | Status: AC

## 2024-05-21 MED ORDER — PREGABALIN 25 MG PO CAPS
50.0000 mg | ORAL_CAPSULE | Freq: Three times a day (TID) | ORAL | Status: DC
  Administered 2024-05-21 – 2024-06-02 (×36): 50 mg via ORAL
  Filled 2024-05-21 (×37): qty 2

## 2024-05-21 MED ORDER — PROPOFOL 10 MG/ML IV BOLUS
INTRAVENOUS | Status: AC
Start: 2024-05-21 — End: 2024-05-21
  Filled 2024-05-21: qty 20

## 2024-05-21 MED ORDER — BUPIVACAINE HCL (PF) 0.5 % IJ SOLN
INTRAMUSCULAR | Status: DC | PRN
  Administered 2024-05-21: 10 mL via PERINEURAL

## 2024-05-21 MED ORDER — OXYCODONE HCL ER 20 MG PO T12A
100.0000 mg | EXTENDED_RELEASE_TABLET | Freq: Three times a day (TID) | ORAL | Status: DC
  Administered 2024-05-21 – 2024-05-22 (×4): 100 mg via ORAL
  Filled 2024-05-21 (×4): qty 5

## 2024-05-21 MED ORDER — 0.9 % SODIUM CHLORIDE (POUR BTL) OPTIME
TOPICAL | Status: DC | PRN
  Administered 2024-05-21: 1000 mL

## 2024-05-21 MED ORDER — MIDAZOLAM HCL 2 MG/2ML IJ SOLN
INTRAMUSCULAR | Status: AC
  Filled 2024-05-21: qty 2

## 2024-05-21 MED ORDER — SUGAMMADEX SODIUM 200 MG/2ML IV SOLN
INTRAVENOUS | Status: DC | PRN
  Administered 2024-05-21 (×2): 200 mg via INTRAVENOUS

## 2024-05-21 MED ORDER — PROPOFOL 10 MG/ML IV BOLUS
INTRAVENOUS | Status: DC | PRN
  Administered 2024-05-21: 150 mg via INTRAVENOUS
  Administered 2024-05-21: 50 mg via INTRAVENOUS

## 2024-05-21 MED ORDER — LACTATED RINGERS IV SOLN: INTRAVENOUS | Status: DC

## 2024-05-21 MED ORDER — VASOPRESSIN 20 UNIT/ML IV SOLN
INTRAVENOUS | Status: AC
  Filled 2024-05-21: qty 1

## 2024-05-21 MED ORDER — FLEET ENEMA RE ENEM: 1.0000 | ENEMA | Freq: Once | RECTAL | Status: DC | PRN

## 2024-05-21 MED ORDER — ONDANSETRON HCL 4 MG/2ML IJ SOLN
INTRAMUSCULAR | Status: DC | PRN
  Administered 2024-05-21: 4 mg via INTRAVENOUS

## 2024-05-21 MED ORDER — CEFAZOLIN SODIUM-DEXTROSE 3-4 GM/150ML-% IV SOLN
3.0000 g | Freq: Once | INTRAVENOUS | Status: AC
  Administered 2024-05-21: 3 g via INTRAVENOUS

## 2024-05-21 MED ORDER — ACETAMINOPHEN 10 MG/ML IV SOLN: 1000.0000 mg | Freq: Once | INTRAVENOUS | Status: AC

## 2024-05-21 MED ORDER — PHENYLEPHRINE HCL (PRESSORS) 10 MG/ML IV SOLN
INTRAVENOUS | Status: DC | PRN
  Administered 2024-05-21 (×2): 100 ug via INTRAVENOUS

## 2024-05-21 MED ORDER — ONDANSETRON HCL 4 MG PO TABS: 4.0000 mg | ORAL_TABLET | Freq: Four times a day (QID) | ORAL | Status: DC | PRN

## 2024-05-21 MED ORDER — PHENYLEPHRINE HCL-NACL 20-0.9 MG/250ML-% IV SOLN
INTRAVENOUS | Status: AC
  Filled 2024-05-21: qty 250

## 2024-05-21 MED ORDER — BUPIVACAINE LIPOSOME 1.3 % IJ SUSP
INTRAMUSCULAR | Status: DC | PRN
  Administered 2024-05-21: 10 mL via PERINEURAL

## 2024-05-21 MED ORDER — ROCURONIUM BROMIDE 100 MG/10ML IV SOLN
INTRAVENOUS | Status: DC | PRN
  Administered 2024-05-21: 20 mg via INTRAVENOUS
  Administered 2024-05-21: 60 mg via INTRAVENOUS

## 2024-05-21 MED ORDER — ACETAMINOPHEN 10 MG/ML IV SOLN
1000.0000 mg | Freq: Once | INTRAVENOUS | Status: DC | PRN
  Administered 2024-05-21: 1000 mg via INTRAVENOUS

## 2024-05-21 MED ORDER — ACETAMINOPHEN 10 MG/ML IV SOLN
INTRAVENOUS | Status: AC
  Filled 2024-05-21: qty 100

## 2024-05-21 MED ORDER — MIDAZOLAM HCL (PF) 2 MG/2ML IJ SOLN
2.0000 mg | Freq: Once | INTRAMUSCULAR | Status: AC
  Administered 2024-05-21: 2 mg via INTRAVENOUS

## 2024-05-21 MED ORDER — CYCLOBENZAPRINE HCL 10 MG PO TABS
10.0000 mg | ORAL_TABLET | Freq: Three times a day (TID) | ORAL | Status: DC
  Administered 2024-05-21 – 2024-06-02 (×37): 10 mg via ORAL
  Filled 2024-05-21 (×37): qty 1

## 2024-05-21 MED ORDER — BISACODYL 5 MG PO TBEC: 5.0000 mg | DELAYED_RELEASE_TABLET | Freq: Every day | ORAL | Status: DC | PRN

## 2024-05-21 MED ORDER — CHLORHEXIDINE GLUCONATE CLOTH 2 % EX PADS
6.0000 | MEDICATED_PAD | Freq: Every day | CUTANEOUS | Status: AC
  Administered 2024-05-21 – 2024-05-25 (×5): 6 via TOPICAL

## 2024-05-21 MED ORDER — FENTANYL CITRATE (PF) 100 MCG/2ML IJ SOLN: 25.0000 ug | INTRAMUSCULAR | Status: DC | PRN

## 2024-05-21 MED ORDER — HYDROMORPHONE HCL 1 MG/ML IJ SOLN
INTRAMUSCULAR | Status: AC
  Filled 2024-05-21: qty 1

## 2024-05-21 MED ORDER — MIDAZOLAM HCL (PF) 2 MG/2ML IJ SOLN
INTRAMUSCULAR | Status: DC | PRN
  Administered 2024-05-21 (×2): 1 mg via INTRAVENOUS

## 2024-05-21 MED ORDER — SUCCINYLCHOLINE CHLORIDE 200 MG/10ML IV SOSY
PREFILLED_SYRINGE | INTRAVENOUS | Status: DC | PRN
  Administered 2024-05-21: 150 mg via INTRAVENOUS

## 2024-05-21 MED ORDER — ACETAMINOPHEN 650 MG RE SUPP: 650.0000 mg | Freq: Four times a day (QID) | RECTAL | Status: DC | PRN

## 2024-05-21 MED ORDER — ASPIRIN 325 MG PO TBEC
325.0000 mg | DELAYED_RELEASE_TABLET | Freq: Every day | ORAL | Status: DC
  Administered 2024-05-22: 325 mg via ORAL
  Filled 2024-05-21: qty 1

## 2024-05-21 SURGICAL SUPPLY — 46 items
BAG COUNTER SPONGE SURGICOUNT (BAG) ×1 IMPLANT
BIT DRILL 10 HOLLOW (BIT) IMPLANT
BIT DRILL 3.8X270 (BIT) IMPLANT
BIT DRILL CAL 3.2 LONG (BIT) IMPLANT
CHLORAPREP W/TINT 26 (MISCELLANEOUS) ×1 IMPLANT
COVER SURGICAL LIGHT HANDLE (MISCELLANEOUS) ×2 IMPLANT
DRAPE C-ARM 42X72 X-RAY (DRAPES) ×1 IMPLANT
DRAPE HALF SHEET 40X57 (DRAPES) ×1 IMPLANT
DRAPE INCISE IOBAN 66X45 STRL (DRAPES) ×1 IMPLANT
DRAPE SURG 17X23 STRL (DRAPES) ×1 IMPLANT
DRAPE SURG ORHT 6 SPLT 77X108 (DRAPES) ×2 IMPLANT
DRAPE U-SHAPE 47X51 STRL (DRAPES) ×1 IMPLANT
DRESSING MEPILEX FLEX 4X4 (GAUZE/BANDAGES/DRESSINGS) IMPLANT
DRSG MEPILEX POST OP 4X8 (GAUZE/BANDAGES/DRESSINGS) IMPLANT
ELECTRODE BLDE 4.0 EZ CLN MEGD (MISCELLANEOUS) IMPLANT
ELECTRODE REM PT RTRN 9FT ADLT (ELECTROSURGICAL) ×1 IMPLANT
GAUZE PAD ABD 8X10 STRL (GAUZE/BANDAGES/DRESSINGS) ×1 IMPLANT
GAUZE SPONGE 4X4 12PLY STRL (GAUZE/BANDAGES/DRESSINGS) IMPLANT
GLOVE BIO SURGEON STRL SZ7.5 (GLOVE) ×1 IMPLANT
GLOVE BIOGEL PI IND STRL 8 (GLOVE) ×1 IMPLANT
GOWN STRL REUS W/ TWL LRG LVL3 (GOWN DISPOSABLE) ×3 IMPLANT
GUIDEROD SS 2.5MMX280MM (ROD) IMPLANT
KIT BASIN OR (CUSTOM PROCEDURE TRAY) ×1 IMPLANT
KIT TURNOVER KIT B (KITS) ×1 IMPLANT
NAIL HUMERAL CANN 8.5 (Nail) IMPLANT
PACK SHOULDER (CUSTOM PROCEDURE TRAY) ×1 IMPLANT
PAD ARMBOARD POSITIONER FOAM (MISCELLANEOUS) ×2 IMPLANT
RESTRAINT HEAD UNIVERSAL NS (MISCELLANEOUS) ×1 IMPLANT
ROD REAMING 2.5 (INSTRUMENTS) IMPLANT
SCREW LOCK ML FT 4.5X50 (Screw) IMPLANT
SCREW LOCKING 4.0 28MM (Screw) IMPLANT
SCREW TI MULTILOC 4.5X42 (Screw) IMPLANT
SLING ARM FOAM STRAP XLG (SOFTGOODS) IMPLANT
SOLN 0.9% NACL POUR BTL 1000ML (IV SOLUTION) ×1 IMPLANT
SOLN STERILE WATER BTL 1000 ML (IV SOLUTION) ×1 IMPLANT
SPONGE T-LAP 18X18 ~~LOC~~+RFID (SPONGE) ×2 IMPLANT
STAPLER SKIN PROX 35W (STAPLE) ×1 IMPLANT
STRIP CLOSURE SKIN 1/2X4 (GAUZE/BANDAGES/DRESSINGS) ×1 IMPLANT
SUCTION TUBE FRAZIER 10FR DISP (SUCTIONS) ×1 IMPLANT
SUPPORT WRAP ARM LG (MISCELLANEOUS) ×1 IMPLANT
SUT VIC AB 0 CT1 27XBRD ANBCTR (SUTURE) IMPLANT
SUT VIC AB 2-0 CT1 TAPERPNT 27 (SUTURE) ×2 IMPLANT
SUTURE FIBERWR #2 38 T-5 BLUE (SUTURE) ×2 IMPLANT
TOWEL GREEN STERILE (TOWEL DISPOSABLE) ×1 IMPLANT
TUBE CONNECTING 12X1/4 (SUCTIONS) ×1 IMPLANT
YANKAUER SUCT BULB TIP NO VENT (SUCTIONS) ×1 IMPLANT

## 2024-05-21 NOTE — Progress Notes (Signed)
 OT Cancellation Note  Patient Details Name: Joseph [name] MRN: [id number] DOB: [birth year]   Cancelled Treatment:    Reason Eval/Treat Not Completed: Patient at procedure or test/ unavailable. Out to OR. (Will follow up as able.)   Warrick POUR OTR/L  Acute Rehab Services  [phone] office number   Warrick Berber 05/21/2024, 7:26 AM

## 2024-05-21 NOTE — Anesthesia Preprocedure Evaluation (Addendum)
 Anesthesia Evaluation  Patient identified by MRN, date of birth, ID band Patient awake    Reviewed: Allergy & Precautions, NPO status , Patient's Chart, lab work & pertinent test results  Airway Mallampati: III  TM Distance: >3 FB Neck ROM: Full    Dental no notable dental hx.    Pulmonary asthma , Current Smoker and Patient abstained from smoking., PE (on Eliquis , LD 11/28)   Pulmonary exam normal        Cardiovascular negative cardio ROS  Rhythm:Regular Rate:Normal     Neuro/Psych   Anxiety Depression    negative neurological ROS     GI/Hepatic negative GI ROS,,,(+)     substance abuse    Endo/Other  Metastatic thyroid  Ca  Renal/GU negative Renal ROS  negative genitourinary   Musculoskeletal Humerus fx   Abdominal  (+) + obese  Peds  Hematology  (+) Blood dyscrasia, anemia Lab Results      Component                Value               Date                      WBC                      7.8                 05/21/2024                HGB                      9.6 (L)             05/21/2024                HCT                      30.6 (L)            05/21/2024                MCV                      94.2                05/21/2024                PLT                      106 (L)             05/21/2024             Lab Results      Component                Value               Date                      NA                       134 (L)             05/21/2024                K  4.2                 05/21/2024                CO2                      27                  05/21/2024                GLUCOSE                  129 (H)             05/21/2024                BUN                      13                  05/21/2024                CREATININE               0.76                05/21/2024                CALCIUM                  8.5 (L)             05/21/2024                GFRNONAA                 >60                  05/21/2024              Anesthesia Other Findings   Reproductive/Obstetrics                              Anesthesia Physical Anesthesia Plan  ASA: 3  Anesthesia Plan: General and Regional   Post-op Pain Management: Regional block*   Induction: Intravenous  PONV Risk Score and Plan: 1 and Ondansetron , Dexamethasone , Midazolam  and Treatment may vary due to age or medical condition  Airway Management Planned: Mask, Oral ETT and Video Laryngoscope Planned  Additional Equipment: None  Intra-op Plan:   Post-operative Plan: Extubation in OR  Informed Consent: I have reviewed the patients History and Physical, chart, labs and discussed the procedure including the risks, benefits and alternatives for the proposed anesthesia with the patient or authorized representative who has indicated his/her understanding and acceptance.     Dental advisory given  Plan Discussed with: CRNA  Anesthesia Plan Comments:         Anesthesia Quick Evaluation

## 2024-05-21 NOTE — Transfer of Care (Signed)
 Immediate Anesthesia Transfer of Care Note  Patient: Joseph [name]  Procedure(s) Performed: INSERTION, INTRAMEDULLARY ROD, HUMERUS (Right)  Patient Location: PACU  Anesthesia Type:General  Level of Consciousness: awake, alert , oriented, and patient cooperative  Airway & Oxygen Therapy: Patient Spontanous Breathing and Patient connected to face mask oxygen  Post-op Assessment: Report given to RN and Post -op Vital signs reviewed and stable  Post vital signs: Reviewed and stable  Last Vitals:  Vitals Value Taken Time  BP 150/72 05/21/24 12:46  Temp    Pulse 98 05/21/24 12:56  Resp 24 05/21/24 12:56  SpO2 99 % 05/21/24 12:56  Vitals shown include unfiled device data.  Last Pain:  Vitals:   05/21/24 0940  TempSrc:   PainSc: 7       Patients Stated Pain Goal: 0 (05/21/24 0031)  Complications: No notable events documented.

## 2024-05-21 NOTE — Interval H&P Note (Signed)
 History and Physical Interval Note:  05/21/2024 9:18 AM  Joseph [name] [place]  has presented today for surgery, with the diagnosis of Right Proximal Humerus Fracture.  The various methods of treatment have been discussed with the patient and family. After consideration of risks, benefits and other options for treatment, the patient has consented to  Procedure(s): OPEN REDUCTION INTERNAL FIXATION (ORIF) PROXIMAL HUMERUS FRACTURE (Right) as a surgical intervention.  The patient's history has been reviewed, patient examined, no change in status, stable for surgery.  I have reviewed the patient's chart and labs.  Questions were answered to the patient's satisfaction.     [name]

## 2024-05-21 NOTE — Anesthesia Procedure Notes (Signed)
 Procedure Name: Intubation Date/Time: 05/21/2024 12:23 PM  Performed by: Rhodia Debby MATSU, CRNAPre-anesthesia Checklist: Patient identified, Emergency Drugs available, Suction available, Patient being monitored and Timeout performed Patient Re-evaluated:Patient Re-evaluated prior to induction Oxygen Delivery Method: Circle system utilized Preoxygenation: Pre-oxygenation with 100% oxygen Induction Type: IV induction Ventilation: Mask ventilation without difficulty Laryngoscope Size: Glidescope, 3 and Mac Grade View: Grade I Tube type: Oral Tube size: 8.0 mm Number of attempts: 1 Airway Equipment and Method: Stylet Placement Confirmation: ETT inserted through vocal cords under direct vision, positive ETCO2 and breath sounds checked- equal and bilateral Secured at: 24 cm Tube secured with: Tape Dental Injury: Teeth and Oropharynx as per pre-operative assessment  Comments: Brief atraumatic dentition unchanged

## 2024-05-21 NOTE — Anesthesia Postprocedure Evaluation (Signed)
 Anesthesia Post Note  Patient: Joseph [name]  Procedure(s) Performed: INSERTION, INTRAMEDULLARY ROD, HUMERUS (Right)     Patient location during evaluation: PACU Anesthesia Type: Regional and General Level of consciousness: awake and alert Pain management: pain level controlled Vital Signs Assessment: post-procedure vital signs reviewed and stable Respiratory status: spontaneous breathing, nonlabored ventilation, respiratory function stable and patient connected to nasal cannula oxygen Cardiovascular status: blood pressure returned to baseline and stable Postop Assessment: no apparent nausea or vomiting Anesthetic complications: no   No notable events documented.  Last Vitals:  Vitals:   05/21/24 1420 05/21/24 1516  BP: (!) 141/88 (!) 143/88  Pulse: (!) 101 (!) 110  Resp:  16  Temp: 36.9 C 36.5 C  SpO2: 97% 98%    Last Pain:  Vitals:   05/21/24 1945  TempSrc:   PainSc: 10-Worst pain ever                 Cordella P Arnelle Nale

## 2024-05-21 NOTE — Plan of Care (Signed)
  Problem: Activity: Goal: Risk for activity intolerance will decrease Outcome: Progressing   Problem: Elimination: Goal: Will not experience complications related to bowel motility Outcome: Progressing Goal: Will not experience complications related to urinary retention Outcome: Progressing   Problem: Pain Managment: Goal: General experience of comfort will improve and/or be controlled Outcome: Progressing   Problem: Safety: Goal: Ability to remain free from injury will improve Outcome: Progressing   Problem: Skin Integrity: Goal: Risk for impaired skin integrity will decrease Outcome: Progressing

## 2024-05-21 NOTE — Hospital Course (Addendum)
 Joseph [name] is a 43 y.o. male with a history of asthma, polysubstance abuse, metastatic papillary thyroid  cancer. Patient presented from acute inpatient rehabilitation secondary to right shoulder pain and found to have evidence of a pathologic right proximal humerus fracture. Orthopedic surgery consulted for surgical repair. He underwent IM rod placement on 11/29. ENT consulted while admitted and recommended outpatient follow-up.  Rad onc also consulted and recommended radiation to T1-3 and right humerus. Started on 12/11 beginning to back first.      Assessment and Plan:   Pathologic right proximal humerus fracture Patient with previously known lucency of right proximal humerus consistent with metastasis. X-ray just prior to this admission significant for a lytic lesion with pathologic fracture of the right proximal humerus with mild displacement.  Orthopedic surgery consulted and successfully placed an intramedullary rod on 11/29. -Orthopedic surgery recommendations: weight bearing as tolerated, hand, wrist, elbow only -pain control  - radiation to begin after T-spine treatment  - per ortho: Up with therapy- work with OT, hand, wrist elbow only Weight Bearing as Tolerated (WBAT) RUE VTE prophylaxis: Eliquis   Follow up in office in 2 weeks for recheck and updated xrays. Patient to remain in sling until then.    Metastatic papillary thyroid  cancer Pulmonary nodules and bone lesions concerning for metastatic disease. Recently diagnosed on recent admission. Patient set up to follow-up with medical oncology, Dr. Tina. ENT and endocrinology referral placed on prior admission. ENT consulted. CT neck obtained and without lateral or central cervical lymphadenopathy. ENT plan to discuss at multidisciplinary tumor board on 06/08/24.  - outpatient further management with oncology and ENT already in place   History of thoracic spine fracture Pathological in etiology. Patient underwent  instrumentation and fusion on prior admission. -Continue pain management - involved T1-3 - per rad onc: course of 2 weeks of radiotherapy to the thoracic spine beginning on Thursday this week, and 5 fraction. We will simulate today and begin treatment to the spine on 06/02/24, and to the right humerus on 06/09/24. - continues to ambulate well with PT   History of pulmonary embolism Recently diagnosed on 11/6. Provoked secondary to metastatic cancer. Patient is managed on Eliquis , which was initially held for surgery and is now resumed. -Continue Eliquis    Paraplegia Neurogenic bladder Patient was admitted from acute inpatient rehabilitation. Patient  -Continue PT/OT   Chronic normocytic anemia Stable.   Constipation -Continue MiraLAX    Chronic pain Patient's pain medication regimen was managed while in acute inpatient rehab. Patient developed overdose requiring narcan . Analgesics adjusted. Palliative care consulted. Patient started on methadone . -Palliative care recommendations: methadone , Lyrica , oxycodone  as needed, dilaudid  IV as needed-- trying to limit IV so patient can return to CIR   History of substance abuse Noted. Patient counseled on admission.

## 2024-05-21 NOTE — Anesthesia Procedure Notes (Signed)
 Anesthesia Regional Block: Interscalene brachial plexus block   Pre-Anesthetic Checklist: , timeout performed,  Correct Patient, Correct Site, Correct Laterality,  Correct Procedure, Correct Position, site marked,  Risks and benefits discussed,  Surgical consent,  Pre-op evaluation,  At surgeon's request and post-op pain management  Laterality: Right  Prep: Dura Prep       Needles:  Injection technique: Single-shot  Needle Type: Echogenic Stimulator Needle     Needle Length: 5cm  Needle Gauge: 20     Additional Needles:   Procedures:,,,, ultrasound used (permanent image in chart),,    Narrative:  Start time: 05/21/2024 9:33 AM End time: 05/21/2024 9:36 AM Injection made incrementally with aspirations every 5 mL.  Performed by: Personally  Anesthesiologist: Dorethea Cordella SQUIBB, DO  Additional Notes: Patient identified. Risks/Benefits/Options discussed with patient including but not limited to bleeding, infection, nerve damage, failed block, incomplete pain control. Patient expressed understanding and wished to proceed. All questions were answered. Sterile technique was used throughout the entire procedure. Please see nursing notes for vital signs. Aspirated in 5cc intervals with injection for negative confirmation. Patient was given instructions on fall risk and not to get out of bed. All questions and concerns addressed with instructions to call with any issues or inadequate analgesia.

## 2024-05-21 NOTE — Progress Notes (Signed)
 PROGRESS NOTE    Joseph [name]  FMW:[id number] DOB: [birth year] DOA: 05/21/2024 PCP: Patient, No Pcp Per   Brief Narrative: Joseph [name] is a 43 y.o. male with a history of asthma, polysubstance abuse, metastatic papillary thyroid  cancer.  Patient presented from acute inpatient rehabilitation secondary to right shoulder pain and found to have evidence of a pathologic right proximal humerus fracture. Orthopedic surgery consulted for surgical repair.   Assessment and Plan:  Pathologic right proximal humerus fracture Patient with previously known lucency of right proximal humerus consistent with metastasis. X-ray just prior to this admission significant for a lytic lesion with pathologic fracture of the right proximal humerus with mild displacement. Orthopedic surgery consulted and successfully placed an intramedullary rod on 11/29. -Orthopedic surgery recommendations: pending today -PT/OT eval  Metastatic papillary thyroid  cancer Pulmonary nodules and bone lesions concerning for metastatic disease. Recently diagnosed on recent admission. Patient set up to follow-up with medical oncology, Dr. Tina. ENT and endocrinology referral placed on prior admission.  History of thoracic spine fracture Pathological in etiology. Patient underwent instrumentation and fusion on prior admission. -Continue pain management  History of pulmonary embolism Recently diagnosed on 11/6. Provoked secondary to metastatic cancer. Patient is managed on Eliquis , which was held for surgery. -Resume Eliquis  as soon as able per orthopedic surgery recommendations  Paraplegia Neurogenic bladder Patient was admitted from acute inpatient rehabilitation. Patient  -Continue PT/OT  Chronic normocytic anemia Stable.  Constipation -Continue MiraLAX   Chronic pain Patient's pain medication regimen was managed while in acute inpatient rehab. Currently on oxycodone  12 hour formulation and oxycodone  immediate  release -Continue Oxycodone  12 hr 100 mg q8 hours -Continue oxycodone   20 mg q4 hours as needed  History of substance abuse Noted. Patient counseled on admission.   DVT prophylaxis: SCDs; restart Eliquis  as soon as able. Code Status:   Code Status: Full Code Family Communication: None at bedside Disposition Plan: Discharge likely back to acute inpatient rehabilitation pending PT/OT eval, stable hemoglobin, orthopedic surgery recommendations   Consultants:  Orthopedic surgery  Procedures:  Insertion, intramedullary rod, humerus  Antimicrobials: None    Subjective: Patient is currently in PACU still recovering.  Objective: BP 113/85 (BP Location: Left Arm)   Pulse 98   Temp 98.6 F (37 C) (Oral)   Resp 18   SpO2 97%   Examination:  General exam: Appears calm and comfortable. Respiratory system: Clear to auscultation. Respiratory effort normal. Cardiovascular system: S1 & S2 heard, RRR. Gastrointestinal system: Abdomen is nondistended, soft and nontender. Normal bowel sounds heard. Central nervous system: Somnolent but arouses easily Musculoskeletal: Bilateral LE edema    Data Reviewed: I have personally reviewed following labs and imaging studies  CBC Lab Results  Component Value Date   WBC 7.8 05/21/2024   RBC 3.25 (L) 05/21/2024   HGB 9.6 (L) 05/21/2024   HCT 30.6 (L) 05/21/2024   MCV 94.2 05/21/2024   MCH 29.5 05/21/2024   PLT 106 (L) 05/21/2024   MCHC 31.4 05/21/2024   RDW 13.6 05/21/2024   LYMPHSABS 1.5 05/17/2024   MONOABS 0.7 05/17/2024   EOSABS 0.3 05/17/2024   BASOSABS 0.1 05/17/2024     Last metabolic panel Lab Results  Component Value Date   NA 134 (L) 05/21/2024   K 4.2 05/21/2024   CL 100 05/21/2024   CO2 27 05/21/2024   BUN 13 05/21/2024   CREATININE 0.76 05/21/2024   GLUCOSE 129 (H) 05/21/2024   GFRNONAA >60 05/21/2024   GFRAA >60 08/16/2019  CALCIUM 8.5 (L) 05/21/2024   PHOS 4.1 05/16/2024   PROT 6.3 (L) 05/17/2024    ALBUMIN  2.3 (L) 05/17/2024   BILITOT 0.4 05/17/2024   ALKPHOS 79 05/17/2024   AST 18 05/17/2024   ALT 34 05/17/2024   ANIONGAP 7 05/21/2024    GFR: Estimated Creatinine Clearance: 169.1 mL/min (by C-G formula based on SCr of 0.76 mg/dL).  No results found for this or any previous visit (from the past 240 hours).    Radiology Studies: DG Humerus Right Result Date: 05/20/2024 EXAM: 1 VIEW(S) XRAY OF THE RIGHT HUMERUS 05/20/2024 01:46:00 PM COMPARISON: MRI shoulder 05/19/2024 and right shoulder radiograph 05/01/2024. CLINICAL HISTORY: Right shoulder pain. FINDINGS: BONES AND JOINTS: Lytic lesion involving the surgical neck of the right proximal humerus with pathologic fracture. Mild displacement. Periosteal reaction at the fracture site. The glenohumeral and acromioclavicular joints and elbow are approximated. No joint dislocation. SOFT TISSUES: Numerous linear metallic densities in the soft tissues of the right upper arm and distal arm compatible with needle fragments. Soft tissue swelling surrounding the proximal humerus. Areas of pneumatosis in the soft tissues. IMPRESSION: 1. Lytic lesion involving the surgical neck of the right proximal humerus with pathologic fracture. Mild displacement is new since the prior radiograph. 2. Soft tissue swelling surrounding the proximal humerus with areas of soft tissue swelling and periosteal reaction at the fracture site. 3. Numerous linear metallic densities in the soft tissues of the right upper arm and distal arm compatible with needle fragments. Electronically signed by: Donnice Mania MD 05/20/2024 02:06 PM EST RP Workstation: [id number]   MR SHOULDER RIGHT WO CONTRAST Result Date: 05/20/2024 EXAM: MRI OF THE RIGHT SHOULDER WITHOUT CONTRAST 05/19/2024 12:04:39 PM TECHNIQUE: Multiplanar multisequence MRI of the right shoulder was performed without the administration of intravenous contrast. COMPARISON: Radiograph 05/01/2024. 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 FINDINGS: ROTATOR CUFF: Intact supraspinatus, infraspinatus, subscapularis and teres minor tendons. Edema signal in the teres minor muscle. No significant muscle atrophy. BICEPS TENDON: Long head biceps tendon is intact and normally located. LABRUM: No gross labral tear or paralabral cyst; motion artifact precludes sensitive assessment of the labrum. GLENOHUMERAL JOINT: Physiologic amount of joint fluid. No high-grade cartilage loss. Normal alignment. AC JOINT AND ACROMIOCLAVICULAR ARCH: No significant acromial downsloping or subacromial spur. No significant degenerative changes. Intact acromioclavicular and coracoclavicular ligaments. BURSA: No significant subacromial/subdeltoid bursitis. BONE MARROW: Pathologic transverse fracture of the proximal humerus just below the surgical neck with moderate apex anterolateral angulation associated with a 6.0 x 3.7 x 4.1 cm metastatic lesion of the right proximal humerus. The lesion is mildly expansile and has high T2 and low T1 signal in a relatively homogeneous pattern. OUTLET SPACES: Normal MRI appearance of the quadrilateral space. No significant narrowing of the supraspinatus outlet. SOFT TISSUES: Edema tracks along fascial planes around the fracture site. Edema signal proximally in the long head of the triceps. Nonspecific edema tracks along the superficial fascial margin of the posterolateral deltoid. LIMITATIONS/ARTIFACTS: Motion artifact is present, reducing diagnostic sensitivity and specificity. IMPRESSION: 1. Pathologic transverse fracture of the proximal humerus just below the surgical neck with moderate apex anterolateral angulation, associated with a 6.0 x 3.7 x 4.1 cm metastatic lesion of the right proximal humerus. Edema tracks along fascial planes around the fracture site. 2. Edema signal in the teres minor muscle and proximally in the long head of the triceps.  3. Nonspecific edema along the superficial fascial margin of the posterolateral deltoid.  Electronically signed by: Ryan Salvage MD 05/20/2024 10:36 AM EST RP Workstation: [id number]      LOS: 0 days    [name], MD [facility] 05/21/2024, 7:22 AM   If 7PM-7AM, please contact night-coverage [email or web address]

## 2024-05-21 NOTE — Progress Notes (Signed)
   05/21/24 1420  Vitals  Temp 98.4 F (36.9 C)  Temp Source Oral  BP (!) 141/88  MAP (mmHg) 103  BP Location Left Arm  BP Method Automatic  Patient Position (if appropriate) Lying  Pulse Rate (!) 101  Pulse Rate Source Monitor  Level of Consciousness  Level of Consciousness Alert  MEWS COLOR  MEWS Score Color Green  Oxygen Therapy  SpO2 97 %  O2 Device Room Air  MEWS Score  MEWS Temp 0  MEWS Systolic 0  MEWS Pulse 1  MEWS RR 0  MEWS LOC 0  MEWS Score 1   Returned from PACU

## 2024-05-21 NOTE — H&P (Signed)
 History and Physical    KARRON ALVIZO FMW:[id number] DOB: 1981/04/25 DOA: 05/21/2024  PCP: Patient, No Pcp Per   Patient coming from: CIR   Chief Complaint: Right shoulder pain, pathologic fracture of proximal humerus   HPI: Joseph [name] is a 43 y.o. male with medical history significant for childhood asthma, polysubstance abuse, and recently diagnosed metastatic papillary thyroid  cancer who is directly admitted from Inpatient Rehabilitation for likely surgical management of pathologic right proximal humerus fracture.  Patient had presented to the hospital on 04/27/2024 with back pain and difficulty ambulating.  He was found to have pulmonary embolism, pathologic T2 fracture, and large thyroid  nodule.    Papillary carcinoma was diagnosed with thyroid  biopsy from 05/04/2024.  He underwent T1-T3 laminectomy and T2 corpectomy for resection of metastatic tumor on 05/05/2024.  Surgical pathology confirms metastatic papillary thyroid  carcinoma.  He was seen by medical oncology.  He was referred to ENT, endocrinology, and radiation oncology.  He had acute blood loss anemia after the back surgery and was transfused with 2 units RBCs.  He was started on Eliquis  for his PE.   He was discharged to inpatient rehabilitation on 05/16/2024.  Palliative care has assisted with pain management.  He has been on a bowel regimen for constipation.  His ability to participate with rehabilitation was limited by severe right shoulder pain, prompting imaging that reveals pathologic transverse fracture of the proximal right humerus.  Orthopedic surgery was consulted and is considering surgical repair, possibly tomorrow.  Hospitalists were contacted for admission back to the inpatient unit.    Review of Systems:  All other systems reviewed and apart from HPI, are negative.  Past Medical History:  Diagnosis Date   Anxiety    Asthma    as a child   Depression    Narcotic abuse (HCC)    Pulmonary  nodules 04/29/2024    Past Surgical History:  Procedure Laterality Date   CLOSED REDUCTION FINGER WITH PERCUTANEOUS PINNING Right 06/22/2013   Procedure: RIGHT HAND SMALL FINGER CLOSED MANIPULATION AND PINNING;  Surgeon: Prentice LELON Pagan, MD;  Location: [facility] OR;  Service: Orthopedics;  Laterality: Right;   I & D EXTREMITY  03/24/2012   Procedure: IRRIGATION AND DEBRIDEMENT EXTREMITY;  Surgeon: Oneil JAYSON Herald, MD;  Location: [facility] OR;  Service: Orthopedics;  Laterality: Right;  Right Forearm   POSTERIOR CERVICAL FUSION/FORAMINOTOMY N/A 05/05/2024   Procedure: POSTERIOR CERVICAL FUSION CERVICAL SEVEN-THORACIC ONE, THORACIC ONE-THORACIC TWO, THORACIC TWO-THORACIC THREE ,FOR RESECTION OF TUMOR THORACIC TWO CORPECTOMY;  Surgeon: Darnella Dorn SAUNDERS, MD;  Location: [facility] OR;  Service: Neurosurgery;  Laterality: N/A;    Social History:   reports that he has been smoking cigarettes. He has a 10 pack-year smoking history. He has never used smokeless tobacco. He reports that he does not currently use alcohol. He reports current drug use. Drug: Marijuana.  No Known Allergies  Family History  Problem Relation Age of Onset   Other Other    Alcohol abuse Other      Prior to Admission medications   Not on File    Physical Exam: There were no vitals filed for this visit.   Constitutional: NAD, no pallor or diaphoresis   Eyes: PERTLA, lids and conjunctivae normal ENMT: Mucous membranes are moist. Posterior pharynx clear of any exudate or lesions.   Neck: supple, no masses  Respiratory: no wheezing, no crackles. No accessory muscle use.  Cardiovascular: S1 & S2 heard, regular rate and rhythm. No JVD.  Abdomen: No  tenderness, soft. Bowel sounds active.  Musculoskeletal: no clubbing / cyanosis. Right shoulder tender; neurovascularly intact.   Skin: no significant rashes, lesions, ulcers. Warm, dry, well-perfused. Neurologic: Sleeping, wakes to voice and is fully oriented. Mild b/l LE weakness.  Psychiatric:  Calm. Cooperative.    Labs and Imaging on Admission: I have personally reviewed following labs and imaging studies  CBC: Recent Labs  Lab 05/14/24 0745 05/15/24 0535 05/16/24 0426 05/17/24 0749  WBC 14.9* 11.5* 10.8* 9.9  NEUTROABS  --   --   --  7.1  HGB 9.8* 9.8* 9.9* 9.6*  HCT 30.8* 30.8* 31.5* 30.0*  MCV 93.3 92.8 94.0 92.6  PLT PLATELET CLUMPS NOTED ON SMEAR, UNABLE TO ESTIMATE PLATELET CLUMPS NOTED ON SMEAR, UNABLE TO ESTIMATE 122* PLATELET CLUMPS NOTED ON SMEAR, UNABLE TO ESTIMATE   Basic Metabolic Panel: Recent Labs  Lab 05/14/24 0745 05/16/24 0426 05/17/24 0528  NA 134* 133* 133*  K 3.6 4.3 4.4  CL 100 98 98  CO2 24 27 26   GLUCOSE 121* 91 117*  BUN 14 18 14   CREATININE 0.68 0.61 0.76  CALCIUM 7.8* 8.5* 8.4*  MG 1.5* 1.7  --   PHOS 3.7 4.1  --    GFR: Estimated Creatinine Clearance: 169.1 mL/min (by C-G formula based on SCr of 0.76 mg/dL). Liver Function Tests: Recent Labs  Lab 05/16/24 0426 05/17/24 0528  AST 18 18  ALT 33 34  ALKPHOS 69 79  BILITOT 0.4 0.4  PROT 6.4* 6.3*  ALBUMIN  2.4* 2.3*   No results for input(s): LIPASE, AMYLASE in the last 168 hours. No results for input(s): AMMONIA in the last 168 hours. Coagulation Profile: No results for input(s): INR, PROTIME in the last 168 hours. Cardiac Enzymes: No results for input(s): CKTOTAL, CKMB, CKMBINDEX, TROPONINI in the last 168 hours. BNP (last 3 results) No results for input(s): PROBNP in the last 8760 hours. HbA1C: No results for input(s): HGBA1C in the last 72 hours. CBG: No results for input(s): GLUCAP in the last 168 hours. Lipid Profile: No results for input(s): CHOL, HDL, LDLCALC, TRIG, CHOLHDL, LDLDIRECT in the last 72 hours. Thyroid  Function Tests: No results for input(s): TSH, T4TOTAL, FREET4, T3FREE, THYROIDAB in the last 72 hours. Anemia Panel: No results for input(s): VITAMINB12, FOLATE, FERRITIN, TIBC, IRON ,  RETICCTPCT in the last 72 hours. Urine analysis:    Component Value Date/Time   COLORURINE AMBER (A) 05/12/2024 0929   APPEARANCEUR CLEAR 05/12/2024 0929   LABSPEC 1.029 05/12/2024 0929   PHURINE 5.0 05/12/2024 0929   GLUCOSEU NEGATIVE 05/12/2024 0929   HGBUR NEGATIVE 05/12/2024 0929   BILIRUBINUR NEGATIVE 05/12/2024 0929   KETONESUR NEGATIVE 05/12/2024 0929   PROTEINUR NEGATIVE 05/12/2024 0929   UROBILINOGEN 1.0 04/23/2012 1842   NITRITE NEGATIVE 05/12/2024 0929   LEUKOCYTESUR NEGATIVE 05/12/2024 0929   Sepsis Labs: @LABRCNTIP (procalcitonin:4,lacticidven:4) )No results found for this or any previous visit (from the past 240 hours).   Radiological Exams on Admission: DG Humerus Right Result Date: 05/20/2024 EXAM: 1 VIEW(S) XRAY OF THE RIGHT HUMERUS 05/20/2024 01:46:00 PM COMPARISON: MRI shoulder 05/19/2024 and right shoulder radiograph 05/01/2024. CLINICAL HISTORY: Right shoulder pain. FINDINGS: BONES AND JOINTS: Lytic lesion involving the surgical neck of the right proximal humerus with pathologic fracture. Mild displacement. Periosteal reaction at the fracture site. The glenohumeral and acromioclavicular joints and elbow are approximated. No joint dislocation. SOFT TISSUES: Numerous linear metallic densities in the soft tissues of the right upper arm and distal arm compatible with needle fragments. Soft tissue swelling surrounding  the proximal humerus. Areas of pneumatosis in the soft tissues. IMPRESSION: 1. Lytic lesion involving the surgical neck of the right proximal humerus with pathologic fracture. Mild displacement is new since the prior radiograph. 2. Soft tissue swelling surrounding the proximal humerus with areas of soft tissue swelling and periosteal reaction at the fracture site. 3. Numerous linear metallic densities in the soft tissues of the right upper arm and distal arm compatible with needle fragments. Electronically signed by: Donnice Mania MD 05/20/2024 02:06 PM EST RP  Workstation: [id number]   MR SHOULDER RIGHT WO CONTRAST Result Date: 05/20/2024 EXAM: MRI OF THE RIGHT SHOULDER WITHOUT CONTRAST 05/19/2024 12:04:39 PM TECHNIQUE: Multiplanar multisequence MRI of the right shoulder was performed without the administration of intravenous contrast. COMPARISON: Radiograph 05/01/2024. 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 FINDINGS: ROTATOR CUFF: Intact supraspinatus, infraspinatus, subscapularis and teres minor tendons. Edema signal in the teres minor muscle. No significant muscle atrophy. BICEPS TENDON: Long head biceps tendon is intact and normally located. LABRUM: No gross labral tear or paralabral cyst; motion artifact precludes sensitive assessment of the labrum. GLENOHUMERAL JOINT: Physiologic amount of joint fluid. No high-grade cartilage loss. Normal alignment. AC JOINT AND ACROMIOCLAVICULAR ARCH: No significant acromial downsloping or subacromial spur. No significant degenerative changes. Intact acromioclavicular and coracoclavicular ligaments. BURSA: No significant subacromial/subdeltoid bursitis. BONE MARROW: Pathologic transverse fracture of the proximal humerus just below the surgical neck with moderate apex anterolateral angulation associated with a 6.0 x 3.7 x 4.1 cm metastatic lesion of the right proximal humerus. The lesion is mildly expansile and has high T2 and low T1 signal in a relatively homogeneous pattern. OUTLET SPACES: Normal MRI appearance of the quadrilateral space. No significant narrowing of the supraspinatus outlet. SOFT TISSUES: Edema tracks along fascial planes around the fracture site. Edema signal proximally in the long head of the triceps. Nonspecific edema tracks along the superficial fascial margin of the posterolateral deltoid. LIMITATIONS/ARTIFACTS: Motion artifact is present, reducing diagnostic sensitivity and specificity. IMPRESSION: 1. Pathologic  transverse fracture of the proximal humerus just below the surgical neck with moderate apex anterolateral angulation, associated with a 6.0 x 3.7 x 4.1 cm metastatic lesion of the right proximal humerus. Edema tracks along fascial planes around the fracture site. 2. Edema signal in the teres minor muscle and proximally in the long head of the triceps. 3. Nonspecific edema along the superficial fascial margin of the posterolateral deltoid. Electronically signed by: Ryan Salvage MD 05/20/2024 10:36 AM EST RP Workstation: [id number]    Assessment/Plan   1. Pathologic right proximal humerus fracture  - Appreciate orthopedic surgery consultation    - Hold Eliquis  (last dose was 8:15 PM on 11/28), type & screen, keep NPO after midnight, continue pain-control and supportive care    2. Metastatic papillary thyroid  cancer  - Referred to medical and radiation oncology, ENT, and endocrinology  - Dr. Laurence will be his medical oncologist, Dr. Lanny covering this weekend    3. Pulmonary embolism  - RUL segmental PE without heart strain diagnosed by CTA on 11/5 and Eliquis  started  - Hold Eliquis  in anticipation of surgery, last dose was 20:15 on 11/28    4. Paraplegia; neurogenic bladder   - Continue PT/OT, supportive care    5. Anemia  - Required RBC transfusions after surgery; Hgb appeared to stabilize in the 10-range  - Repeat CBC, type & screen prior to surgery   6. Constipation  - Continue bowel regimen  7. Hx of substance abuse  - Counseled     DVT prophylaxis: SCDs  Code Status: Full  Level of Care: Level of care: Med-Surg Family Communication: None present  Disposition Plan:  Patient is from: CIR  Anticipated d/c is to: TBD Anticipated d/c date is: 05/24/24  Patient currently: Pending likely operative repair of humerus fracture, disposition planning  Consults called: Orthopedic surgery  Admission status: Inpatient     Evalene GORMAN Sprinkles, MD [facility]  05/21/2024,  1:34 AM

## 2024-05-21 NOTE — Progress Notes (Signed)
       Overnight   NAME: Joseph [name] MRN: [id number] DOB : [birth year]    Date of Service   05/21/2024   HPI/Events of Note    HPI: Joseph [name] is a 43 y.o. male with a history of asthma, polysubstance abuse, metastatic papillary thyroid  cancer. Patient presented from acute inpatient rehabilitation secondary to right shoulder pain and found to have evidence of a pathologic right proximal humerus fracture. Orthopedic surgery consulted for surgical repair.   Events of note: fever of 101.6 noted at 2130, tachycardia (125).  Tylenol  given p.o. ordered blood cultures, urinalysis to rule out infectious etiology.  Ordered incentive spirometry due to recent intubation.    Interventions/ Plan   Draw Blood cultures Collect UA Incentive spirometry      Update: 2306 - Fever has decreased to 100.5, tachycardia remains with a heart rate of 121.   Joscelyne Renville Donati- Aram BSN RN CCRN AGACNP-BC Acute Care Nurse Practitioner [facility]

## 2024-05-21 NOTE — Op Note (Addendum)
 Procedure(s): INSERTION, INTRAMEDULLARY ROD, HUMERUS Procedure Note  Joseph [name] male 43 y.o. 05/21/2024  Preoperative diagnosis: Right humerus pathologic fracture  Postoperative diagnosis: Same  Procedure(s) and Anesthesia Type:    * INSERTION, INTRAMEDULLARY ROD, HUMERUS - General Biopsy right humeral lesion  Surgeons and Role:    DEWAINE Dozier Soulier, MD - Primary  Specimen: Biopsy specimen from the pathologic fracture site was sent for pathology.   Indications:  43 y.o. male with recent diagnosis of thyroid  cancer with wide metastasis.  Found recently to have pathologic fracture through the right proximal humerus.  Indicated for stabilization for palliation.   Surgeon: Josefa LELON Dozier   Assistants: Jeoffrey Northern PA-C Amber was present and scrubbed throughout the procedure and was essential in positioning, retraction, exposure, and closure)  Anesthesia: General endotracheal anesthesia with preoperative interscalene block given by the attending anesthesiologist     Procedure Detail  INSERTION, INTRAMEDULLARY ROD, HUMERUS  Findings: Long humeral nail, Synthes, 2 proximal locking screws, 1 distal interlock.  Estimated Blood Loss:  less than 100 mL         Drains: none  Blood Given: none         Specimens: none        Complications:  * No complications entered in OR log *         Disposition: PACU - hemodynamically stable.         Condition: stable    Procedure:  DESCRIPTION OF PROCEDURE: The patient was identified in preoperative  holding area where I personally marked the operative site after  verifying site, side, and procedure with the patient. The patient was taken back  to the operating room where general anesthesia was induced without  complication and was placed in the beach-chair position with the back  elevated about 40 degrees and all extremities carefully padded and  positioned. The neck was turned very slightly away from the  operative field  to assist in exposure. The right upper extremity was then prepped and  draped in a standard sterile fashion. The appropriate time-out  procedure was carried out. The patient did receive IV antibiotics  within 30 minutes of incision.   An approximately 3 cm incision was made over the anterolateral corner of the acromion.  Dissection was carried down through subcutaneous tissues and the deltoid was split anterolaterally.  The underlying rotator cuff was identified and the humeral head was palpated.  The guidepin was placed in the center of the superior aspect of the humeral head in 2 views and advanced.  The entry reamer was used to gain entry.  A pituitary rondure was used to obtain some sample from the pathologic fracture site for tissue biopsy under x-ray guidance.  A ball-tipped guidewire was then advanced with x-ray guidance.  Once the guidewire was passed the fracture of the appropriate length was measured.  The 8.5 x 270 mm nail was then advanced over the guidewire without difficulty.  2 proximal locking screws were placed using the jig with x-ray guidance with careful spreading down to bone.  The jig was then removed and perfect circles technique distally was used to place 1 anterior to posterior interlocking screw with good fixation.  Final fluoroscopic imaging demonstrated anatomic alignment with appropriate fixation and length with the nail and screws. Copious irrigation was used followed by layered closure with 0 Vicryl in a deep fascial layer, 2-0 Vicryl and staples for skin.  A light sterile dressing was applied.  The patient was placed in  a sling and allowed to awaken from anesthesia and transferred to the stretcher in stable condition.  Postoperative plan: We will allow weightbearing to tolerance given his lower extremity paraplegia and need for his upper extremities.

## 2024-05-22 ENCOUNTER — Inpatient Hospital Stay (HOSPITAL_COMMUNITY): Payer: MEDICAID

## 2024-05-22 DIAGNOSIS — C7801 Secondary malignant neoplasm of right lung: Secondary | ICD-10-CM | POA: Diagnosis not present

## 2024-05-22 DIAGNOSIS — C7951 Secondary malignant neoplasm of bone: Secondary | ICD-10-CM | POA: Diagnosis not present

## 2024-05-22 DIAGNOSIS — G8222 Paraplegia, incomplete: Secondary | ICD-10-CM | POA: Diagnosis not present

## 2024-05-22 DIAGNOSIS — M84521A Pathological fracture in neoplastic disease, right humerus, initial encounter for fracture: Secondary | ICD-10-CM | POA: Diagnosis not present

## 2024-05-22 DIAGNOSIS — C7802 Secondary malignant neoplasm of left lung: Secondary | ICD-10-CM

## 2024-05-22 LAB — HEPATIC FUNCTION PANEL
ALT: 37 U/L (ref 0–44)
AST: 23 U/L (ref 15–41)
Albumin: 2.3 g/dL — ABNORMAL LOW (ref 3.5–5.0)
Alkaline Phosphatase: 101 U/L (ref 38–126)
Bilirubin, Direct: 0.1 mg/dL (ref 0.0–0.2)
Indirect Bilirubin: 0.2 mg/dL — ABNORMAL LOW (ref 0.3–0.9)
Total Bilirubin: 0.3 mg/dL (ref 0.0–1.2)
Total Protein: 6.4 g/dL — ABNORMAL LOW (ref 6.5–8.1)

## 2024-05-22 LAB — BASIC METABOLIC PANEL WITH GFR
Anion gap: 7 (ref 5–15)
Anion gap: 9 (ref 5–15)
BUN: 12 mg/dL (ref 6–20)
BUN: 13 mg/dL (ref 6–20)
CO2: 26 mmol/L (ref 22–32)
CO2: 29 mmol/L (ref 22–32)
Calcium: 8.1 mg/dL — ABNORMAL LOW (ref 8.9–10.3)
Calcium: 8.6 mg/dL — ABNORMAL LOW (ref 8.9–10.3)
Chloride: 97 mmol/L — ABNORMAL LOW (ref 98–111)
Chloride: 99 mmol/L (ref 98–111)
Creatinine, Ser: 0.76 mg/dL (ref 0.61–1.24)
Creatinine, Ser: 0.83 mg/dL (ref 0.61–1.24)
GFR, Estimated: >60 mL/min (ref >60)
GFR, Estimated: >60 mL/min (ref >60)
Glucose, Bld: 125 mg/dL — ABNORMAL HIGH (ref 70–99)
Glucose, Bld: 163 mg/dL — ABNORMAL HIGH (ref 70–99)
Potassium: 4.3 mmol/L (ref 3.5–5.1)
Potassium: 4.7 mmol/L (ref 3.5–5.1)
Sodium: 132 mmol/L — ABNORMAL LOW (ref 135–145)
Sodium: 135 mmol/L (ref 135–145)

## 2024-05-22 LAB — URINALYSIS, ROUTINE W REFLEX MICROSCOPIC
Bilirubin Urine: NEGATIVE
Glucose, UA: NEGATIVE mg/dL
Hgb urine dipstick: NEGATIVE
Ketones, ur: NEGATIVE mg/dL
Leukocytes,Ua: NEGATIVE
Nitrite: NEGATIVE
Protein, ur: NEGATIVE mg/dL
Specific Gravity, Urine: <1.005 — ABNORMAL LOW (ref 1.005–1.030)
pH: 6.5 (ref 5.0–8.0)

## 2024-05-22 LAB — URINALYSIS, COMPLETE (UACMP) WITH MICROSCOPIC
Bilirubin Urine: NEGATIVE
Glucose, UA: NEGATIVE mg/dL
Hgb urine dipstick: NEGATIVE
Ketones, ur: NEGATIVE mg/dL
Leukocytes,Ua: NEGATIVE
Nitrite: NEGATIVE
Protein, ur: NEGATIVE mg/dL
Specific Gravity, Urine: 1.02 (ref 1.005–1.030)
pH: 6.5 (ref 5.0–8.0)

## 2024-05-22 LAB — CBC
HCT: 29.1 % — ABNORMAL LOW (ref 39.0–52.0)
Hemoglobin: 9.3 g/dL — ABNORMAL LOW (ref 13.0–17.0)
MCH: 29.3 pg (ref 26.0–34.0)
MCHC: 32 g/dL (ref 30.0–36.0)
MCV: 91.8 fL (ref 80.0–100.0)
Platelets: UNDETERMINED K/uL (ref 150–400)
RBC: 3.17 MIL/uL — ABNORMAL LOW (ref 4.22–5.81)
RDW: 13.5 % (ref 11.5–15.5)
WBC: 9 K/uL (ref 4.0–10.5)
nRBC: 0 % (ref 0.0–0.2)

## 2024-05-22 LAB — GLUCOSE, CAPILLARY: Glucose-Capillary: 108 mg/dL — ABNORMAL HIGH (ref 70–99)

## 2024-05-22 LAB — PHOSPHORUS: Phosphorus: 5.2 mg/dL — ABNORMAL HIGH (ref 2.5–4.6)

## 2024-05-22 LAB — MAGNESIUM: Magnesium: 1.7 mg/dL (ref 1.7–2.4)

## 2024-05-22 MED ORDER — APIXABAN 5 MG PO TABS
5.0000 mg | ORAL_TABLET | Freq: Two times a day (BID) | ORAL | Status: DC
  Administered 2024-05-22 – 2024-06-02 (×22): 5 mg via ORAL
  Filled 2024-05-22 (×22): qty 1

## 2024-05-22 MED ORDER — SODIUM CHLORIDE 0.9 % IV SOLN: INTRAVENOUS | Status: DC

## 2024-05-22 MED ORDER — NALOXONE HCL 0.4 MG/ML IJ SOLN
0.2000 mg | Freq: Once | INTRAMUSCULAR | Status: AC
  Administered 2024-05-22: 0.2 mg via INTRAVENOUS
  Filled 2024-05-22: qty 1

## 2024-05-22 MED ORDER — ENSURE MAX PROTEIN PO LIQD
11.0000 [oz_av] | Freq: Two times a day (BID) | ORAL | Status: DC
  Administered 2024-05-22 – 2024-06-02 (×15): 11 [oz_av] via ORAL

## 2024-05-22 MED ORDER — ADULT MULTIVITAMIN W/MINERALS CH
1.0000 | ORAL_TABLET | Freq: Every day | ORAL | Status: DC
  Administered 2024-05-22 – 2024-06-02 (×12): 1 via ORAL
  Filled 2024-05-22 (×12): qty 1

## 2024-05-22 NOTE — Plan of Care (Signed)
  Problem: Clinical Measurements: Goal: Ability to maintain clinical measurements within normal limits will improve Outcome: Progressing Goal: Will remain free from infection Outcome: Progressing Goal: Diagnostic test results will improve Outcome: Progressing Goal: Cardiovascular complication will be avoided Outcome: Progressing   Problem: Nutrition: Goal: Adequate nutrition will be maintained Outcome: Progressing   

## 2024-05-22 NOTE — Progress Notes (Signed)
 PHARMACY - ANTICOAGULATION CONSULT NOTE  Pharmacy Consult for apixaban  Indication: pulmonary embolus  No Known Allergies  Patient Measurements: Height: 5' 9 (175.3 cm) Weight: (!) 142.9 kg (315 lb) IBW/kg (Calculated) : 70.7 HEPARIN  DW (KG): 104.7  Vital Signs: Temp: 98.9 F (37.2 C) (11/30 1002) Temp Source: Oral (11/30 1002) BP: 127/73 (11/30 0541) Pulse Rate: 100 (11/30 0541)  Labs: Recent Labs    05/21/24 0243 05/22/24 0022 05/22/24 0201  HGB 9.6*  --  9.3*  HCT 30.6*  --  29.1*  PLT 106*  --  PLATELET CLUMPS NOTED ON SMEAR, UNABLE TO ESTIMATE  CREATININE 0.76 0.76  --     Estimated Creatinine Clearance: 167.7 mL/min (by C-G formula based on SCr of 0.76 mg/dL).   Medical History: Past Medical History:  Diagnosis Date   Anxiety    Asthma    as a child   Depression    Narcotic abuse (HCC)    Pulmonary nodules 04/29/2024      Assessment: 49 yoM admitted s/p orthopedic surgery for R humerus fracture. Pt started on apixaban  for PE diagnosed 11/6 which was held preop, pharmacy consulted to resume. Pt completed 7 day load so will begin maintenance dosing.   Plan:  Apixaban  5mg  BID Pharmacy will sign off, reconsult as needed  Ozell Jamaica, PharmD, BCPS, [facility] Clinical Pharmacist [phone] Please check AMION for all [facility] Pharmacy numbers 05/22/2024

## 2024-05-22 NOTE — Plan of Care (Signed)
 Patient was discharged to acute care due to medical reasons and unable to complete program

## 2024-05-22 NOTE — Progress Notes (Signed)
       Overnight   NAME: BRENTYN SEEHAFER MRN: [id number] DOB : 03/01/1981    Date of Service   05/22/2024   HPI/Events of Note   HPI: 43 year old history of asthma polysubstance abuse metastatic papillary thyroid  cancer.  Patient presented from acute inpatient rehabilitation secondary to right shoulder pain and found to have evidence of pathologic right proximal humerus fracture orthopedic surgery consulted for surgical repair.  11/29 patient received intramedullary rod in the right humerus.  Subsequently postoperatively he has been spiking fevers of 102 and is currently being worked up for sepsis.    Overnight events: Rapid response called for change in level of consciousness, temperature of 101 and heart rate of 126.  RN checked glucose which was 108.  Rapid response nurse indicated she thought the patient may need a dose of Narcan .  0.2 mg of Narcan  ordered with immediate response and patient's level of consciousness.  Advised RN to hold  opioid pain medications at this time.   Interventions/ Plan   Narcan  0.2 mg Hold opioids for time being        Update 0050:   Per RN patient more alert and oriented at this time.  Patient requesting pain medication.  Advised to start with Flexeril  and Robaxin .  Vital signs stable 124/62 bp, heart rate 105, SpO2 97 on 2-3L nasal cannula, temperature 100.9.    Roderica Cathell Donati- Aram BSN RN CCRN AGACNP-BC Acute Care Nurse Practitioner [facility]

## 2024-05-22 NOTE — Progress Notes (Signed)
   05/22/24 2206  Assess: MEWS Score  Temp (!) 101 F (38.3 C)  BP (!) 122/90  MAP (mmHg) 99  Pulse Rate (!) 128  Resp 16  Level of Consciousness Alert  SpO2 92 %  O2 Device Nasal Cannula  O2 Flow Rate (L/min) 3 L/min  Assess: MEWS Score  MEWS Temp 1  MEWS Systolic 0  MEWS Pulse 2  MEWS RR 0  MEWS LOC 0  MEWS Score 3  MEWS Score Color Yellow  Assess: if the MEWS score is Yellow or Red  Were vital signs accurate and taken at a resting state? Yes  Does the patient meet 2 or more of the SIRS criteria? Yes  Does the patient have a confirmed or suspected source of infection? No  MEWS guidelines implemented  Yes, yellow  Treat  MEWS Interventions Considered administering scheduled or prn medications/treatments as ordered  Take Vital Signs  Increase Vital Sign Frequency  Yellow: Q2hr x1, continue Q4hrs until patient remains green for 12hrs  Escalate  MEWS: Escalate Yellow: Discuss with charge nurse and consider notifying provider and/or RRT  Notify: Charge Nurse/RN  Name of Charge Nurse/RN Notified Mahima RN  Provider Notification  Provider Name/Title Lynwood Kipper NP  Date Provider Notified 05/22/24  Time Provider Notified 2206  Method of Notification Page (secure chat)  Notification Reason Change in status  Provider response See new orders  Date of Provider Response 05/22/24  Time of Provider Response 2214  Notify: Rapid Response  Name of Rapid Response RN Notified Mindy Hopper  Date Rapid Response Notified 05/22/24  Time Rapid Response Notified 2206  Assess: SIRS CRITERIA  SIRS Temperature  1  SIRS Respirations  0  SIRS Pulse 1  SIRS WBC 0  SIRS Score Sum  2

## 2024-05-22 NOTE — Progress Notes (Signed)
 PATIENT ID: Joseph [name]  MRN: [id number]  DOB/AGE:  [birth year] / 43 y.o.  1 Day Post-Op Procedure(s) (LRB): INSERTION, INTRAMEDULLARY ROD, HUMERUS (Right)  Subjective: Patient sleeping in room. Upon waking, asks for pain medication and then fell back asleep.   Objective: Vital signs in last 24 hours: Temp:  [97.7 F (36.5 C)-102 F (38.9 C)] 98.6 F (37 C) (11/30 0541) Pulse Rate:  [93-125] 100 (11/30 0541) Resp:  [9-24] 18 (11/30 0541) BP: (107-153)/(65-88) 127/73 (11/30 0541) SpO2:  [95 %-100 %] 97 % (11/30 0541)  Intake/Output from previous day: 11/29 0701 - 11/30 0700 In: 806 [I.V.:556; IV Piggyback:250] Out: 3400 [Urine:3150; Blood:250] Intake/Output this shift: No intake/output data recorded.  Recent Labs    05/21/24 0243 05/22/24 0201  HGB 9.6* 9.3*   Recent Labs    05/21/24 0243 05/22/24 0201  WBC 7.8 9.0  RBC 3.25* 3.17*  HCT 30.6* 29.1*  PLT 106* PLATELET CLUMPS NOTED ON SMEAR, UNABLE TO ESTIMATE   Recent Labs    05/21/24 0243 05/22/24 0022  NA 134* 132*  K 4.2 4.3  CL 100 97*  CO2 27 26  BUN 13 12  CREATININE 0.76 0.76  GLUCOSE 129* 163*  CALCIUM 8.5* 8.1*    Physical Exam: Alert and oriented Sensation intact distally RUE Intact pulses distally RUE Incision: dressing changes as they were falling off due to diaphoresis No cellulitis present Sling in place RUE  Assessment/Plan: 1 Day Post-Op Procedure(s) (LRB): INSERTION, INTRAMEDULLARY ROD, HUMERUS (Right)   Advance diet Up with therapy- work with OT, hand, wrist elbow only Weight Bearing as Tolerated (WBAT) RUE VTE prophylaxis: aspirin  Follow up in office in 2 weeks for recheck and updated xrays. Patient to remain in sling until then.   Today's  total administered Morphine  Milligram Equivalents: 210 Yesterday's total administered Morphine  Milligram Equivalents: 420  Earon Rivest L. [place], PA-C 05/22/2024, 9:11 AM

## 2024-05-22 NOTE — Progress Notes (Signed)
 PROGRESS NOTE    Joseph [name]  FMW:[id number] DOB: 1981/02/12 DOA: 05/21/2024 PCP: Patient, No Pcp Per   Brief Narrative:  Joseph [name] is a 43 y.o. male with a history of asthma, polysubstance abuse, metastatic papillary thyroid  cancer.  Patient presented from acute inpatient rehabilitation secondary to right shoulder pain and found to have evidence of a pathologic right proximal humerus fracture. Orthopedic surgery consulted for surgical repair and underwent insertion of the intramedullary rod of his humerus with general biopsy of the right humeral lesion as well on 11/29. Subsequently Post-operatively he has been spiking Fevers of 102 and currently being worked up for this.   Assessment and Plan:  Pathologic right proximal humerus fracture: Patient with previously known lucency of right proximal humerus consistent with metastasis. X-ray just prior to this admission significant for a lytic lesion with pathologic fracture of the right proximal humerus with mild displacement. Orthopedic surgery consulted and successfully placed an intramedullary rod on 11/29. -Orthopedic surgery recommendations: They are currently recommending up with therapy and working with OT and hand and wrist elbow only; weightbearing as tolerated on the right upper extremity and they are also recommending resuming anticoagulation with an aspirin after further discussion.  They feel that the patient needs to remain in his sling until his 2-week office recheck and updated x-rays -PT/OT recommending CIR  Metastatic papillary thyroid  cancer: Pulmonary nodules and bone lesions concerning for metastatic disease. Recently diagnosed on recent admission. Patient set up to follow-up with Medical Oncology, Dr. Tina. ENT and Endocrinology referral placed on prior admission.   History of thoracic spine fracture: Pathological in etiology. Patient underwent posterior T1 and T3 instrumentation fusion and T2 corpectomy for  resection of the tumor as well as fusion on prior admission. Continue pain management as below.   Fever: Spiked Temp of 101.6 overnight and was tachycardic. Currently has no Leukocytosis. Start IVF with NS @ 75 mL/hr. Check Blood Cx x2 and pending. Check U/A and CXR. Recently has been diagnosed with PE. CTM off of Abx and if worsens consider placing on Empiric Abx. Flutter Valve and Incentive Spirometry for ATX.  Patient has Albuterol nebs 2.5 mg every 6 as needed for wheezing or shortness of breath   History of Pulmonary Embolism: Recently diagnosed on 11/5. Provoked secondary to metastatic cancer. Patient is managed on Apixaban , which was held for surgery (last dose was 11/28 at 20:15. Resuming Apixaban  now; Orthopedic surgery had initiated ASA 325 mg po Daily but after discussion w/ Ortho PA Amber Porterfield, ok to resume AC now.  Albuterol as above   Paraplegia and Neurogenic bladder: Patient was admitted from acute inpatient rehabilitation. C/w Supportive Care. Continue PT/OT and they are recommending CIR  Hyponatremia: Na+ Trend:  Recent Labs  Lab 05/11/24 1324 05/12/24 0609 05/14/24 0745 05/16/24 0426 05/17/24 0528 05/21/24 0243 05/22/24 0022  NA 133* 134* 134* 133* 133* 134* 132*  -Start NS @ 75 mL/hr. CTM and Trend and repeat CMP in the AM    Constipation: Continue MiraLAX  17 grams po BID and Senna-Docusate 2 tab po BID; Also has Fleet Enema 1 enema RC onceprn Severe Constipation; D/C Scheduled Docusate 100 mg po BID   Chronic pain: Patient's pain medication regimen was managed while in acute inpatient rehab.Continue Oxycodone  12 hr 100 mg q8 hours and Oxycodone  IR  20 mg q4 hours as needed. Also has Methocarbamol  750 mg po AC/HS and Cyclobenzaprine  10 mg po TID; also has Pregabalin  50 mg p.o. 3 times daily  History of substance abuse: Noted. Patient counseled on admission.  Normocytic Anemia: Chronic. Hgb/Hct Trend:  Recent Labs  Lab 05/13/24 0500 05/14/24 0745  05/15/24 0535 05/16/24 0426 05/17/24 0749 05/21/24 0243 05/22/24 0201  HGB 10.1* 9.8* 9.8* 9.9* 9.6* 9.6* 9.3*  HCT 31.3* 30.8* 30.8* 31.5* 30.0* 30.6* 29.1*  MCV 93.4 93.3 92.8 94.0 92.6 94.2 91.8  -Check Anemia Panel in the AM. CTM for S/Sx of Bleeding; No overt bleeding noted. Repeat CBC in the AM  Thrombocytopenia: Mild. Plt Count Trend:  Recent Labs  Lab 05/13/24 0500 05/14/24 0745 05/15/24 0535 05/16/24 0426 05/17/24 0749 05/21/24 0243 05/22/24 0201  PLT PLATELET CLUMPS NOTED ON SMEAR, UNABLE TO ESTIMATE PLATELET CLUMPS NOTED ON SMEAR, UNABLE TO ESTIMATE PLATELET CLUMPS NOTED ON SMEAR, UNABLE TO ESTIMATE 122* PLATELET CLUMPS NOTED ON SMEAR, UNABLE TO ESTIMATE 106* PLATELET CLUMPS NOTED ON SMEAR, UNABLE TO ESTIMATE  -CTM and Trend and Monitor closely now that [facility] is being resumed. Repeat CBC in the AM  Hypoalbuminemia: Patient's Albumin  Trend: Recent Labs  Lab 04/28/24 0000 05/07/24 0349 05/16/24 0426 05/17/24 0528 05/22/24 0201  ALBUMIN  3.7 3.1* 2.4* 2.3* 2.3*  -Continue to Monitor and Trend and repeat CMP in the AM  Class III (Morbid) Obesity: Complicates overall prognosis and care. Estimated body mass index is 46.52 kg/m as calculated from the following:   Height as of this encounter: 5' 9 (1.753 m).   Weight as of this encounter: 142.9 kg. Weight Loss and Dietary Counseling given   DVT prophylaxis: SCDs Start: 05/21/24 1410 SCDs Start: 05/21/24 0130 apixaban  (ELIQUIS ) tablet 5 mg    Code Status: Full Code Family Communication: No family present @ bedside  Disposition Plan:  Level of care: Med-Surg Status is: Inpatient Remains inpatient appropriate because: Needs further Ortho Clearance, workup of Fever, and bed availability at [facility]   Consultants:  Orthopedic Surgery  Procedures:  As delineated as above   Antimicrobials:  Anti-infectives (From admission, onward)    Start     Dose/Rate Route Frequency Ordered Stop   05/21/24 0945  ceFAZolin  (ANCEF )  IVPB 3g/150 mL premix        3 g 300 mL/hr over 30 Minutes Intravenous  Once 05/21/24 0944 05/21/24 1042       Subjective: Seen and examined at bedside and he was diaphoretic this morning and not feeling as well.  States that he felt hot.  Complained of shoulder and arm pain.  No nausea or vomiting.  Denies any lightheadedness or dizziness.  States that he is coughing up some yellowish phlegm.  No other concerns or complaints at this time.  Objective: Vitals:   05/22/24 0427 05/22/24 0541 05/22/24 1002 05/22/24 1547  BP: 122/85 127/73  138/73  Pulse: 99 100  (!) 109  Resp: 18 18  18   Temp: 99.7 F (37.6 C) 98.6 F (37 C) 98.9 F (37.2 C) 98.6 F (37 C)  TempSrc: Oral Oral Oral Oral  SpO2: 97% 97%  92%  Weight:      Height:        Intake/Output Summary (Last 24 hours) at 05/22/2024 1601 Last data filed at 05/22/2024 1444 Gross per 24 hour  Intake 600 ml  Output 3875 ml  Net -3275 ml   Filed Weights   05/21/24 0844  Weight: (!) 142.9 kg   Examination: Physical Exam:  Constitutional: WN/WD morbidly obese chronically ill-appearing African-American male who is diaphoretic and appears overall comfortable Respiratory: Diminished to auscultation bilaterally with some coarse breath sounds, no wheezing,  rales, rhonchi or crackles. Normal respiratory effort and patient is not tachypenic. No accessory muscle use.  Unlabored breathing Cardiovascular: RRR, no murmurs / rubs / gallops. S1 and S2 auscultated.  Trace extremity edema Abdomen: Soft, non-tender, distended secondary to body habitus. Bowel sounds positive.  GU: Deferred. Musculoskeletal: Right shoulder is in a sling and surgical incisions are covered Skin: Patient is diaphoretic.  No appreciable rashes or lesions on limited skin evaluation Neurologic: CN 2-12 grossly intact with no focal deficits. Romberg sign cerebellar reflexes not assessed.  Psychiatric: Normal judgment and insight. Alert and oriented x 3. Normal mood  and appropriate affect.   Data Reviewed: I have personally reviewed following labs and imaging studies  CBC: Recent Labs  Lab 05/16/24 0426 05/17/24 0749 05/21/24 0243 05/22/24 0201  WBC 10.8* 9.9 7.8 9.0  NEUTROABS  --  7.1  --   --   HGB 9.9* 9.6* 9.6* 9.3*  HCT 31.5* 30.0* 30.6* 29.1*  MCV 94.0 92.6 94.2 91.8  PLT 122* PLATELET CLUMPS NOTED ON SMEAR, UNABLE TO ESTIMATE 106* PLATELET CLUMPS NOTED ON SMEAR, UNABLE TO ESTIMATE   Basic Metabolic Panel: Recent Labs  Lab 05/16/24 0426 05/17/24 0528 05/21/24 0243 05/22/24 0022 05/22/24 0201  NA 133* 133* 134* 132*  --   K 4.3 4.4 4.2 4.3  --   CL 98 98 100 97*  --   CO2 27 26 27 26   --   GLUCOSE 91 117* 129* 163*  --   BUN 18 14 13 12   --   CREATININE 0.61 0.76 0.76 0.76  --   CALCIUM 8.5* 8.4* 8.5* 8.1*  --   MG 1.7  --  1.7  --  1.7  PHOS 4.1  --   --   --  5.2*   GFR: Estimated Creatinine Clearance: 167.7 mL/min (by C-G formula based on SCr of 0.76 mg/dL). Liver Function Tests: Recent Labs  Lab 05/16/24 0426 05/17/24 0528 05/22/24 0201  AST 18 18 23   ALT 33 34 37  ALKPHOS 69 79 101  BILITOT 0.4 0.4 0.3  PROT 6.4* 6.3* 6.4*  ALBUMIN  2.4* 2.3* 2.3*   No results for input(s): LIPASE, AMYLASE in the last 168 hours. No results for input(s): AMMONIA in the last 168 hours. Coagulation Profile: No results for input(s): INR, PROTIME in the last 168 hours. Cardiac Enzymes: No results for input(s): CKTOTAL, CKMB, CKMBINDEX, TROPONINI in the last 168 hours. BNP (last 3 results) No results for input(s): PROBNP in the last 8760 hours. HbA1C: No results for input(s): HGBA1C in the last 72 hours. CBG: No results for input(s): GLUCAP in the last 168 hours. Lipid Profile: No results for input(s): CHOL, HDL, LDLCALC, TRIG, CHOLHDL, LDLDIRECT in the last 72 hours. Thyroid  Function Tests: No results for input(s): TSH, T4TOTAL, FREET4, T3FREE, THYROIDAB in the last 72  hours. Anemia Panel: No results for input(s): VITAMINB12, FOLATE, FERRITIN, TIBC, IRON, RETICCTPCT in the last 72 hours. Sepsis Labs: No results for input(s): PROCALCITON, LATICACIDVEN in the last 168 hours.  Recent Results (from the past 240 hours)  Culture, blood (Routine X 2) w Reflex to ID Panel     Status: None (Preliminary result)   Collection Time: 05/21/24 10:58 PM   Specimen: BLOOD  Result Value Ref Range Status   Specimen Description BLOOD BLOOD LEFT HAND  Final   Special Requests   Final    BOTTLES DRAWN AEROBIC AND ANAEROBIC Blood Culture results may not be optimal due to an inadequate volume of blood  received in culture bottles   Culture   Final    NO GROWTH < 12 HOURS Performed at [facility], [address], [place], [place] [address]    Report Status PENDING  Incomplete  Culture, blood (Routine X 2) w Reflex to ID Panel     Status: None (Preliminary result)   Collection Time: 05/21/24 10:58 PM   Specimen: BLOOD  Result Value Ref Range Status   Specimen Description BLOOD BLOOD LEFT ARM  Final   Special Requests   Final    BOTTLES DRAWN AEROBIC ONLY Blood Culture results may not be optimal due to an inadequate volume of blood received in culture bottles   Culture   Final    NO GROWTH < 12 HOURS Performed at [facility], [address] [place], [place] [address]    Report Status PENDING  Incomplete    Radiology Studies: DG Humerus Right Result Date: 05/21/2024 CLINICAL DATA:  Operative imaging from right proximal humerus ORIF. EXAM: RIGHT HUMERUS - 2+ VIEW; DG C-ARM 1-60 MIN-NO REPORT COMPARISON:  05/12/2024. FLUOROSCOPY: Exposure Index (as provided by the fluoroscopic device): 18.84 mGy Kerma FINDINGS: Five submitted images show placement an intramedullary rod humeral head to the distal metadiaphysis. Rod and associated fixation screws are well seated. Proximal pathologic fracture is well aligned. IMPRESSION: 1. Intraoperative  fluoroscopy provided for right humeral ORIF. Electronically Signed   By: Alm Parkins M.D.   On: 05/21/2024 18:56   DG C-Arm 1-60 Min-No Report Result Date: 05/21/2024 Fluoroscopy was utilized by the requesting physician.  No radiographic interpretation.   DG C-Arm 1-60 Min-No Report Result Date: 05/21/2024 Fluoroscopy was utilized by the requesting physician.  No radiographic interpretation.   Scheduled Meds:  apixaban   5 mg Oral BID   Chlorhexidine  Gluconate Cloth  6 each Topical Daily   cyclobenzaprine   10 mg Oral TID   methocarbamol   750 mg Oral TID AC & HS   multivitamin with minerals  1 tablet Oral Daily   mupirocin  ointment  1 Application Nasal BID   oxyCODONE   100 mg Oral Q8H   polyethylene glycol  17 g Oral BID   pregabalin   50 mg Oral TID   Ensure Max Protein  11 oz Oral BID   senna-docusate  2 tablet Oral BID   Continuous Infusions:  sodium chloride  75 mL/hr at 05/22/24 1541    LOS: 1 day   Alejandro Marker, DO [facility] Available via Epic secure chat 7am-7pm After these hours, please refer to coverage provider listed on [email or web address] 05/22/2024, 4:01 PM

## 2024-05-22 NOTE — Discharge Instructions (Signed)
 Discharge Instructions after Open Shoulder Repair  A sling has been provided for you. Remain in your sling at all times. This includes sleeping in your sling.  Use ice on the shoulder intermittently over the first 48 hours after surgery.  Pain medicine has been prescribed for you.  Use your medicine liberally over the first 48 hours, and then you can begin to taper your use. You may take Extra Strength Tylenol  or Tylenol  only in place of the pain pills. DO NOT take ANY nonsteroidal anti-inflammatory pain medications: Advil , Motrin , Ibuprofen , Aleve, Naproxen or Naprosyn.  Change dressing as needed You may shower 5 days after surgery. The incisions CANNOT get wet prior to 5 days. Simply allow the water to wash over the site and then pat dry. Do not rub the incisions. Make sure your axilla (armpit) is completely dry after showering.    Please call [phone] during normal business hours or [phone] after hours for any problems. Including the following:  - excessive redness of the incisions - drainage for more than 4 days - fever of more than 101.5 F  *Please note that pain medications will not be refilled after hours or on weekends.

## 2024-05-22 NOTE — Significant Event (Signed)
 Rapid Response Event Note   Reason for Call :  Decreased MS, hypoxia-80s on RA  Initial Focused Assessment:  Pt lying in bed with eyes open. He has an upward gaze. He will respond and answer some questions with continue verbal stimulation. He is oriented to person/place/time, disoriented to situation. Pupils-3, equal. He moves all extremities to commands. He falls asleep very easily with  no stimulation. Lungs diminished t/o. Skin hot to touch.   T-101, HR-120, BP-122/90, RR-18, SpO2-92% on 3L [place]  Interventions:  [place] 3L  CBG-108 CBC CMP EKG-ST PCXR- New PIV Narcan  0.2mg  Plan of Care:  Pt awake/SpO2 better after narcan . Await lab values. Continue to monitor pt. Please call RRT if further assistance needed.   Event Summary:   MD Notified: Blondie TRH NP Call Time:2002 Arrival Time:2006 End Time:2307  Tish Graeme Piety, RN

## 2024-05-22 NOTE — Evaluation (Signed)
 Physical Therapy Evaluation Patient Details Name: Joseph [name] MRN: [id number] DOB: Mar 16, 1981 Today's Date: 05/22/2024  History of Present Illness  Pt is a 43 y.o. male presenting 11/28 from CIR with R shoulder pain; found to have R humeral pathologic fx. S/p IM rod placement 11/29. 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 at [facility] 11/24-11/28. PMH: cocaine use disorder, cannibis use disorder.  Clinical Impression   Pt presents with LE weakness, back and RUE pain, impaired seated balance, impaired activity tolerance, decreased light touch sensation bilat Les but pt states it's better. Pt to benefit from acute PT to address deficits. Pt endorsing improved RUE pain vs initial acute stay s/p surgical fixation, tolerating transition to/from EOB with mod +2 and sat EOB x15 minutes before fatiguing. PT hopeful pt will progress well now that RUE is improved and pt is motivated to progress. Patient will benefit from intensive inpatient follow-up therapy, >3 hours/day. PT to progress mobility as tolerated, and will continue to follow acutely.          If plan is discharge home, recommend the following: Two people to help with walking and/or transfers;Two people to help with bathing/dressing/bathroom   Can travel by private vehicle        Equipment Recommendations Other (comment) (defer to next venue)  Recommendations for Other Services       Functional Status Assessment Patient has had a recent decline in their functional status and demonstrates the ability to make significant improvements in function in a reasonable and predictable amount of time.     Precautions / Restrictions Precautions Precautions: Fall;Back;Shoulder Type of Shoulder Precautions: elbow/wrist/hand ROM only, WBAT  for transfers per op note Recall of Precautions/Restrictions: Intact Required Braces or Orthoses: Sling Restrictions Weight Bearing Restrictions Per Provider Order: Yes RUE Weight Bearing Per Provider Order: Weight bearing as tolerated RLE Weight Bearing Per Provider Order: Weight bearing as tolerated LLE Weight Bearing Per Provider Order: Weight bearing as tolerated Other Position/Activity Restrictions: Per Dr. Dozier We will allow weightbearing to tolerance given his lower extremity paraplegia and need for his upper extremities      Mobility  Bed Mobility Overal bed mobility: Needs Assistance Bed Mobility: Supine to Sit, Sit to Supine Rolling: +2 for safety/equipment, +2 for physical assistance, Min assist   Supine to sit: Mod assist, +2 for safety/equipment, +2 for physical assistance Sit to supine: Mod assist, +2 for physical assistance, +2 for safety/equipment   General bed mobility comments: assist for trunk and LE management, scooting to/from EOB with bed pad, pt able to boost self up with LUE and trendelenburg upon return to supine    Transfers                   General transfer comment: NT this session given truncal fatigue with EOB sitting x15 minutes    Ambulation/Gait                  Stairs            Wheelchair Mobility     Tilt Bed    Modified Rankin (Stroke Patients Only)       Balance Overall balance assessment: History of Falls, Needs assistance Sitting-balance support: Feet supported Sitting balance-Leahy Scale: Fair Sitting balance - Comments: fair to poor with fatigue, requiring min-mod truncal support posteriorly after  15 minutes EOB sitting       Standing balance comment: nt                             Pertinent Vitals/Pain Pain Assessment Pain Assessment: Faces Faces Pain Scale: Hurts little more Pain Location: R shoulder, back Pain Descriptors / Indicators: Constant, Discomfort, Grimacing,  Guarding Pain Intervention(s): Monitored during session, Limited activity within patient's tolerance, Repositioned    Home Living Family/patient expects to be discharged to:: Private residence Living Arrangements: Other relatives Available Help at Discharge: Family;Available 24 hours/day Type of Home: House Home Access: Stairs to enter Entrance Stairs-Rails: None Entrance Stairs-Number of Steps: 2   Home Layout: One level Home Equipment: Cane - single point;Shower seat;BSC/3in1      Prior Function Prior Level of Function : Independent/Modified Independent;History of Falls (last six months)             Mobility Comments: indep, no AD, 1 recent fall due to BLEs giving out ADLs Comments: indep, drives     Extremity/Trunk Assessment   Upper Extremity Assessment Upper Extremity Assessment: Defer to OT evaluation RUE Deficits / Details: elbow/wrist/hand ROM WFL. orders for elbow/wrist/hand ROM only    Lower Extremity Assessment Lower Extremity Assessment: Generalized weakness;LLE deficits/detail;RLE deficits/detail (2/5 DF/PF, at least 3/5 knee extension, hip abd/add but limited by sitting tolerance and balance to do true MMT) RLE Sensation: decreased light touch LLE Sensation: decreased light touch    Cervical / Trunk Assessment Cervical / Trunk Assessment: Back Surgery  Communication   Communication Communication: No apparent difficulties    Cognition Arousal: Alert Behavior During Therapy: WFL for tasks assessed/performed, Anxious   PT - Cognitive impairments: No apparent impairments                         Following commands: Intact       Cueing Cueing Techniques: Verbal cues     General Comments General comments (skin integrity, edema, etc.): 2LO2, VSS    Exercises     Assessment/Plan    PT Assessment Patient needs continued PT services  PT Problem List Decreased strength;Decreased activity tolerance;Decreased balance;Decreased  mobility;Decreased coordination;Pain;Decreased knowledge of precautions;Decreased safety awareness;Obesity;Cardiopulmonary status limiting activity;Decreased knowledge of use of DME       PT Treatment Interventions DME instruction;Gait training;Functional mobility training;Therapeutic activities;Therapeutic exercise;Balance training;Neuromuscular re-education;Patient/family education    PT Goals (Current goals can be found in the Care Plan section)  Acute Rehab PT Goals Patient Stated Goal: progress mobility PT Goal Formulation: With patient Time For Goal Achievement: 06/05/24 Potential to Achieve Goals: Good    Frequency Min 3X/week     Co-evaluation PT/OT/SLP Co-Evaluation/Treatment: Yes Reason for Co-Treatment: Complexity of the patient's impairments (multi-system involvement);To address functional/ADL transfers;For patient/therapist safety PT goals addressed during session: Mobility/safety with mobility;Balance OT goals addressed during session: ADL's and self-care       AM-PAC PT 6 Clicks Mobility  Outcome Measure Help needed turning from your back to your side while in a flat bed without using bedrails?: A Lot Help needed moving from lying on your back to sitting on the side of a flat bed without using bedrails?: A Lot Help needed moving to and from a bed to a chair (including a wheelchair)?: Total Help needed standing up from a chair using your arms (e.g., wheelchair or bedside chair)?: Total Help needed to walk in hospital room?: Total Help needed climbing 3-5 steps with  a railing? : Total 6 Click Score: 8    End of Session Equipment Utilized During Treatment: Other (comment) (RUE sling) Activity Tolerance: Patient limited by fatigue Patient left: in bed;with bed alarm set;with call bell/phone within reach;with SCD's reapplied Nurse Communication: Mobility status PT Visit Diagnosis: Unsteadiness on feet (R26.81);Other abnormalities of gait and mobility  (R26.89);Difficulty in walking, not elsewhere classified (R26.2);Pain Pain - Right/Left: Right Pain - part of body: Shoulder;Arm    Time: 8785-8757 PT Time Calculation (min) (ACUTE ONLY): 28 min   Charges:   PT Evaluation $PT Eval Moderate Complexity: 1 Mod   PT General Charges $$ ACUTE PT VISIT: 1 Visit         Johana RAMAN, PT DPT Acute Rehabilitation Services Secure Chat Preferred  Office [phone]   Neeva Trew E Johna 05/22/2024, 2:57 PM

## 2024-05-22 NOTE — Evaluation (Signed)
 Occupational Therapy Evaluation Patient Details Name: Joseph [name] MRN: [id number] DOB: [birth year] Today's Date: 05/22/2024   History of Present Illness   Pt is a 43 y.o. male presenting 11/28 from CIR with R shoulder pain; found to have R humeral pathologic fx. S/p IM rod placement 11/29. 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 At CIR 11/24-11/28. PMH: cocaine use disorder, cannibis use disorder.     Clinical Impressions Pt admitted with problem above with limitations listed below. Pt eager to mobilize this session after being premedicated for pain. Pt needing up to mod A +2 for bed mobility and with good tolerance of seated UE HEP following physician orders for RUE ROM at EOB. Pt fatiguing after sitting EOB ~12 minutes. Will continue to follow. Given improved pain management s/p surgery, suspect pt will progress well. Recommending return to intensive multidisciplinary rehabilitation >3 hours/day to optimize safety and independence in ADL.       If plan is discharge home, recommend the following:   A lot of help with walking and/or transfers;Two people to help with walking and/or transfers;A lot of help with bathing/dressing/bathroom;Two people to help with bathing/dressing/bathroom     Functional Status Assessment   Patient has had a recent decline in their functional status and demonstrates the ability to make significant improvements in function in a reasonable and predictable amount of time.     Equipment Recommendations   Other (comment) (defer)     Recommendations for Other Services         Precautions/Restrictions   Precautions Precautions: Fall;Back;Shoulder Type of Shoulder Precautions: elbow/wrist/hand ROM only, WBAT for transfers per op notw Recall  of Precautions/Restrictions: Intact Required Braces or Orthoses: Sling Restrictions Weight Bearing Restrictions Per Provider Order: Yes RUE Weight Bearing Per Provider Order: Weight bearing as tolerated Other Position/Activity Restrictions: Per Dr. Dozier We will allow weightbearing to tolerance given his lower extremity paraplegia and need for his upper extremities     Mobility Bed Mobility Overal bed mobility: Needs Assistance Bed Mobility: Supine to Sit, Sit to Supine Rolling: +2 for safety/equipment, +2 for physical assistance, Min assist   Supine to sit: Mod assist, +2 for safety/equipment, +2 for physical assistance Sit to supine: Mod assist, +2 for physical assistance, +2 for safety/equipment        Transfers                   General transfer comment: NT this session      Balance Overall balance assessment: History of Falls, Needs assistance Sitting-balance support: Feet supported Sitting balance-Leahy Scale: Fair                                     ADL either performed or assessed with clinical judgement   ADL Overall ADL's : Needs assistance/impaired Eating/Feeding: Set up;Sitting Eating/Feeding Details (indicate cue type and reason): for cutting up food Grooming: Set up;Sitting   Upper Body Bathing: Moderate assistance;Sitting   Lower Body Bathing: Maximal assistance;Sitting/lateral leans;Sit to/from stand   Upper Body Dressing : Maximal assistance;Sitting   Lower Body Dressing: Total assistance;Bed level Lower Body Dressing Details (indicate cue type and reason): socks   Toilet Transfer Details (indicate cue type and reason): deferred  Vision Baseline Vision/History: 0 No visual deficits Ability to See in Adequate Light: 0 Adequate Patient Visual Report: No change from baseline Vision Assessment?: No apparent visual deficits     Perception Perception: Within Functional Limits       Praxis Praxis:  WFL       Pertinent Vitals/Pain Pain Assessment Pain Assessment: Faces Faces Pain Scale: Hurts little more Pain Location: R shoulder, back Pain Descriptors / Indicators: Constant, Discomfort, Grimacing, Guarding Pain Intervention(s): Limited activity within patient's tolerance, Monitored during session     Extremity/Trunk Assessment Upper Extremity Assessment Upper Extremity Assessment: RUE deficits/detail RUE Deficits / Details: elbow/wrist/hand ROM WFL. orders for elbow/wrist/hand ROM only   Lower Extremity Assessment Lower Extremity Assessment: Defer to PT evaluation   Cervical / Trunk Assessment Cervical / Trunk Assessment: Back Surgery   Communication Communication Communication: No apparent difficulties   Cognition Arousal: Alert Behavior During Therapy: WFL for tasks assessed/performed, Anxious Cognition: No apparent impairments                               Following commands: Intact       Cueing  General Comments   Cueing Techniques: Verbal cues  VSS   Exercises Other Exercises Other Exercises: AROM elbow/wrist/hand elbow flexion/extension 10x, composite flexion/extension 10x, wrist flexion/extension 10x, pronation/supination 10x   Shoulder Instructions      Home Living Family/patient expects to be discharged to:: Private residence Living Arrangements: Other relatives Available Help at Discharge: Family;Available 24 hours/day Type of Home: House Home Access: Stairs to enter [employer] of Steps: 2 Entrance Stairs-Rails: None Home Layout: One level     Bathroom Shower/Tub: Producer, Television/film/video: Handicapped height     Home Equipment: Cane - single point;Shower seat;BSC/3in1          Prior Functioning/Environment Prior Level of Function : Independent/Modified Independent;History of Falls (last six months)             Mobility Comments: indep, no AD, 1 recent fall due to BLEs giving out ADLs  Comments: indep, drives    OT Problem List: Decreased strength;Decreased range of motion;Decreased activity tolerance;Impaired balance (sitting and/or standing);Decreased cognition;Decreased safety awareness;Decreased knowledge of use of DME or AE;Decreased knowledge of precautions   OT Treatment/Interventions:        OT Goals(Current goals can be found in the care plan section)   Acute Rehab OT Goals Patient Stated Goal: get better OT Goal Formulation: With patient Time For Goal Achievement: 06/05/24 Potential to Achieve Goals: Good   OT Frequency:       Co-evaluation              AM-PAC OT 6 Clicks Daily Activity     Outcome Measure Help from another person eating meals?: None Help from another person taking care of personal grooming?: A Little Help from another person toileting, which includes using toliet, bedpan, or urinal?: Total Help from another person bathing (including washing, rinsing, drying)?: A Lot Help from another person to put on and taking off regular upper body clothing?: A Lot Help from another person to put on and taking off regular lower body clothing?: Total 6 Click Score: 13   End of Session Equipment Utilized During Treatment: Other (comment) (R shoulder sling) Nurse Communication: Mobility status  Activity Tolerance: Patient tolerated treatment well Patient left: in bed;with call bell/phone within reach  OT Visit Diagnosis: Unsteadiness on feet (R26.81);Other abnormalities of gait and  mobility (R26.89);Muscle weakness (generalized) (M62.81);History of falling (Z91.81);Pain Pain - Right/Left: Right Pain - part of body: Shoulder                Time: 8785-8757 OT Time Calculation (min): 28 min Charges:  OT General Charges $OT Visit: 1 Visit OT Evaluation $OT Eval Moderate Complexity: 1 Mod  Elma JONETTA Lebron FREDERICK, OTR/L [facility] Office: [phone]   Elma JONETTA Lebron 05/22/2024, 2:37 PM

## 2024-05-23 ENCOUNTER — Other Ambulatory Visit: Payer: Self-pay

## 2024-05-23 ENCOUNTER — Encounter (HOSPITAL_COMMUNITY): Payer: Self-pay | Admitting: Orthopedic Surgery

## 2024-05-23 DIAGNOSIS — C7951 Secondary malignant neoplasm of bone: Secondary | ICD-10-CM | POA: Diagnosis not present

## 2024-05-23 DIAGNOSIS — Z515 Encounter for palliative care: Secondary | ICD-10-CM | POA: Diagnosis not present

## 2024-05-23 DIAGNOSIS — G8222 Paraplegia, incomplete: Secondary | ICD-10-CM | POA: Diagnosis not present

## 2024-05-23 DIAGNOSIS — C7801 Secondary malignant neoplasm of right lung: Secondary | ICD-10-CM | POA: Diagnosis not present

## 2024-05-23 DIAGNOSIS — Z7189 Other specified counseling: Secondary | ICD-10-CM | POA: Diagnosis not present

## 2024-05-23 DIAGNOSIS — G893 Neoplasm related pain (acute) (chronic): Secondary | ICD-10-CM | POA: Diagnosis not present

## 2024-05-23 DIAGNOSIS — M84521A Pathological fracture in neoplastic disease, right humerus, initial encounter for fracture: Secondary | ICD-10-CM | POA: Diagnosis not present

## 2024-05-23 LAB — CBC WITH DIFFERENTIAL/PLATELET
Abs Immature Granulocytes: 0.17 K/uL — ABNORMAL HIGH (ref 0.00–0.07)
Basophils Absolute: 0 K/uL (ref 0.0–0.1)
Basophils Relative: 0 %
Eosinophils Absolute: 0.4 K/uL (ref 0.0–0.5)
Eosinophils Relative: 4 %
HCT: 30.6 % — ABNORMAL LOW (ref 39.0–52.0)
Hemoglobin: 9.5 g/dL — ABNORMAL LOW (ref 13.0–17.0)
Immature Granulocytes: 2 %
Lymphocytes Relative: 18 %
Lymphs Abs: 1.7 K/uL (ref 0.7–4.0)
MCH: 29.1 pg (ref 26.0–34.0)
MCHC: 31 g/dL (ref 30.0–36.0)
MCV: 93.6 fL (ref 80.0–100.0)
Monocytes Absolute: 1 K/uL (ref 0.1–1.0)
Monocytes Relative: 11 %
Neutro Abs: 6 K/uL (ref 1.7–7.7)
Neutrophils Relative %: 65 %
Platelets: UNDETERMINED K/uL (ref 150–400)
RBC: 3.27 MIL/uL — ABNORMAL LOW (ref 4.22–5.81)
RDW: 13.5 % (ref 11.5–15.5)
WBC: 9.2 K/uL (ref 4.0–10.5)
nRBC: 0 % (ref 0.0–0.2)

## 2024-05-23 LAB — PHOSPHORUS: Phosphorus: 4.9 mg/dL — ABNORMAL HIGH (ref 2.5–4.6)

## 2024-05-23 LAB — CBC
HCT: 33.8 % — ABNORMAL LOW (ref 39.0–52.0)
Hemoglobin: 10.5 g/dL — ABNORMAL LOW (ref 13.0–17.0)
MCH: 29.2 pg (ref 26.0–34.0)
MCHC: 31.1 g/dL (ref 30.0–36.0)
MCV: 94.2 fL (ref 80.0–100.0)
Platelets: UNDETERMINED K/uL (ref 150–400)
RBC: 3.59 MIL/uL — ABNORMAL LOW (ref 4.22–5.81)
RDW: 13.4 % (ref 11.5–15.5)
WBC: 9.8 K/uL (ref 4.0–10.5)
nRBC: 0 % (ref 0.0–0.2)

## 2024-05-23 LAB — COMPREHENSIVE METABOLIC PANEL WITH GFR
ALT: 37 U/L (ref 0–44)
AST: 18 U/L (ref 15–41)
Albumin: 2.3 g/dL — ABNORMAL LOW (ref 3.5–5.0)
Alkaline Phosphatase: 102 U/L (ref 38–126)
Anion gap: 7 (ref 5–15)
BUN: 12 mg/dL (ref 6–20)
CO2: 30 mmol/L (ref 22–32)
Calcium: 8.5 mg/dL — ABNORMAL LOW (ref 8.9–10.3)
Chloride: 100 mmol/L (ref 98–111)
Creatinine, Ser: 0.71 mg/dL (ref 0.61–1.24)
GFR, Estimated: >60 mL/min (ref >60)
Glucose, Bld: 111 mg/dL — ABNORMAL HIGH (ref 70–99)
Potassium: 4.3 mmol/L (ref 3.5–5.1)
Sodium: 137 mmol/L (ref 135–145)
Total Bilirubin: 0.6 mg/dL (ref 0.0–1.2)
Total Protein: 6.5 g/dL (ref 6.5–8.1)

## 2024-05-23 LAB — MAGNESIUM: Magnesium: 1.8 mg/dL (ref 1.7–2.4)

## 2024-05-23 MED ORDER — GUAIFENESIN ER 600 MG PO TB12
1200.0000 mg | ORAL_TABLET | Freq: Two times a day (BID) | ORAL | Status: DC
  Administered 2024-05-23 – 2024-06-02 (×19): 1200 mg via ORAL
  Filled 2024-05-23 (×19): qty 2

## 2024-05-23 MED ORDER — LIDOCAINE 5 % EX PTCH
1.0000 | MEDICATED_PATCH | Freq: Every day | CUTANEOUS | Status: DC
  Administered 2024-05-23 – 2024-05-31 (×5): 1 via TRANSDERMAL
  Filled 2024-05-23 (×10): qty 1

## 2024-05-23 MED ORDER — OXYCODONE HCL ER 20 MG PO T12A: 50.0000 mg | EXTENDED_RELEASE_TABLET | Freq: Three times a day (TID) | ORAL | Status: DC

## 2024-05-23 MED ORDER — MAGNESIUM SULFATE 2 GM/50ML IV SOLN
2.0000 g | Freq: Once | INTRAVENOUS | Status: AC
  Administered 2024-05-23: 2 g via INTRAVENOUS
  Filled 2024-05-23: qty 50

## 2024-05-23 MED ORDER — FENTANYL CITRATE (PF) 50 MCG/ML IJ SOSY
25.0000 ug | PREFILLED_SYRINGE | INTRAMUSCULAR | Status: DC | PRN
  Administered 2024-05-23 – 2024-05-25 (×9): 25 ug via INTRAVENOUS
  Filled 2024-05-23 (×9): qty 1

## 2024-05-23 MED ORDER — METHADONE HCL 5 MG PO TABS
5.0000 mg | ORAL_TABLET | Freq: Three times a day (TID) | ORAL | Status: DC
  Administered 2024-05-23 – 2024-05-26 (×9): 5 mg via ORAL
  Filled 2024-05-23 (×9): qty 1

## 2024-05-23 NOTE — Progress Notes (Signed)
 PROGRESS NOTE    Joseph [name]  FMW:[id number] DOB: 08-Dec-1980 DOA: 05/21/2024 PCP: Patient, No Pcp Per   Brief Narrative:  Joseph [name] is a 43 y.o. male with a history of asthma, polysubstance abuse, metastatic papillary thyroid  cancer.  Patient presented from acute inpatient rehabilitation secondary to right shoulder pain and found to have evidence of a pathologic right proximal humerus fracture. Orthopedic surgery consulted for surgical repair and underwent insertion of the intramedullary rod of his humerus with general biopsy of the right humeral lesion as well on 11/29. Subsequently Post-operatively he has been spiking Fevers and currently being worked up for this.  Overnight had a rapid response given his somnolence and drowsiness requiring Narcan .  Palliative care has been consulted for further pain control.  Assessment and Plan:  Pathologic right proximal humerus fracture: Patient with previously known lucency of right proximal humerus consistent with metastasis. X-ray just prior to this admission significant for a lytic lesion with pathologic fracture of the right proximal humerus with mild displacement. Orthopedic surgery consulted and successfully placed an intramedullary rod on 11/29. -Orthopedic surgery recommendations: They are currently recommending up with therapy and working with OT and hand and wrist elbow only; weightbearing as tolerated on the right upper extremity and they are also recommending resuming anticoagulation with an aspirin after further discussion.  They feel that the patient needs to remain in his sling until his 2-week office recheck and updated x-rays -PT/OT recommending CIR and continues to work with them   Metastatic Papillary Thyroid  Cancer: Pulmonary nodules and bone lesions concerning for metastatic disease. Recently diagnosed on recent admission. Patient set up to follow-up with Medical Oncology, Dr. Tina. ENT and Endocrinology referral placed on  prior admission.   History of Thoracic Spine Fracture: Pathological in etiology. Patient underwent posterior T1 and T3 instrumentation fusion and T2 corpectomy for resection of the tumor as well as fusion on prior admission. Continue pain management as below and appreciate Palliative Care assistance    Fever: Spiked Temp of 101.6 the night before last and was tachycardic. T Max in the last 24 hours was 101. Currently has no Leukocytosis still. Start IVF with NS @ 75 mL/hr. Check Blood Cx x2 and showing NGTD @ 2 days. Checked U/A and Negative. CXR showed Low lung volumes and No acute cardiopulmonary process. Recently has been diagnosed with PE. CTM off of Abx and if worsens consider placing on Empiric Abx. Flutter Valve and Incentive Spirometry for ATX.  Patient has Albuterol nebs 2.5 mg every 6 as needed for wheezing or shortness of breath. CTM and will hold off starting Abx currently   History of Pulmonary Embolism: Recently diagnosed on 11/5. Provoked secondary to metastatic cancer. Patient is managed on Apixaban , which was held for surgery (last dose was 11/28 at 20:15. Resuming Apixaban  now;  Albuterol as above   Paraplegia and Neurogenic bladder: Patient was admitted from acute inpatient rehabilitation. C/w Supportive Care. Continue PT/OT and they are recommending CIR when stable for D/C  Hyponatremia: Na+ Trend:  Recent Labs  Lab 05/14/24 0745 05/16/24 0426 05/17/24 0528 05/21/24 0243 05/22/24 0022 05/22/24 2253 05/23/24 0552  NA 134* 133* 133* 134* 132* 135 137  -C/w NS @ 75 mL/hr. CTM and Trend and repeat CMP in the AM    Constipation: Continue MiraLAX  17 grams po BID and Senna-Docusate 2 tab po BID; Also has Fleet Enema 1 enema RC onceprn Severe Constipation; D/C Scheduled Docusate 100 mg po BID   Chronic Pain: Patient's pain  medication regimen was managed while in acute inpatient rehab.Given somnolence and need for Narcan  will change Regimen. Discontinue Oxycodone  12 hr 100 mg q8  hours but will continue Oxycodone  IR  20 mg q4 hours as needed. Also has Methocarbamol  750 mg po AC/HS and Cyclobenzaprine  10 mg po TID; also has Pregabalin  50 mg p.o. 3 times daily; Palliative Care consulted and added 25 mcg IV q3hprn Severe Pain and added Methadone  5 mg po q8h; Also add Lidocaine  1 patch TD   History of Substance Abuse: Noted. Patient counseled on admission.  Normocytic Anemia: Chronic. Hgb/Hct Trend:  Recent Labs  Lab 05/15/24 0535 05/16/24 0426 05/17/24 0749 05/21/24 0243 05/22/24 0201 05/22/24 2253 05/23/24 0552  HGB 9.8* 9.9* 9.6* 9.6* 9.3* 10.5* 9.5*  HCT 30.8* 31.5* 30.0* 30.6* 29.1* 33.8* 30.6*  MCV 92.8 94.0 92.6 94.2 91.8 94.2 93.6  -Check Anemia Panel in the AM. CTM for S/Sx of Bleeding; No overt bleeding noted. Repeat CBC in the AM  Thrombocytopenia: Mild. Plt Count Trend:  Recent Labs  Lab 05/15/24 0535 05/16/24 0426 05/17/24 0749 05/21/24 0243 05/22/24 0201 05/22/24 2253 05/23/24 0552  PLT PLATELET CLUMPS NOTED ON SMEAR, UNABLE TO ESTIMATE 122* PLATELET CLUMPS NOTED ON SMEAR, UNABLE TO ESTIMATE 106* PLATELET CLUMPS NOTED ON SMEAR, UNABLE TO ESTIMATE PLATELET CLUMPS NOTED ON SMEAR, UNABLE TO ESTIMATE PLATELET CLUMPS NOTED ON SMEAR, UNABLE TO ESTIMATE  -CTM and Trend and Monitor closely now that [facility] is being resumed. Repeat CBC in the AM  Hypoalbuminemia: Patient's Albumin  Trend: Recent Labs  Lab 04/28/24 0000 05/07/24 0349 05/16/24 0426 05/17/24 0528 05/22/24 0201 05/23/24 0552  ALBUMIN  3.7 3.1* 2.4* 2.3* 2.3* 2.3*  -CTM & Trend & repeat CMP in the AM  Class III (Morbid) Obesity: Complicates overall prognosis and care. Estimated body mass index is 46.52 kg/m as calculated from the following:   Height as of this encounter: 5' 9 (1.753 m).   Weight as of this encounter: 142.9 kg. Weight Loss and Dietary Counseling given   DVT prophylaxis: SCDs Start: 05/21/24 1410 SCDs Start: 05/21/24 0130 apixaban  (ELIQUIS ) tablet 5 mg    Code Status:  Full Code Family Communication: No family present @ bedside  Disposition Plan:  Level of care: Med-Surg Status is: Inpatient Remains inpatient appropriate because: Needs further clinical improvement and further pain control and improvement in his temperature  Consultants:  Orthopedic Surgery Palliative Care Medicine  Procedures:  As delineated as above  Antimicrobials:  Anti-infectives (From admission, onward)    Start     Dose/Rate Route Frequency Ordered Stop   05/21/24 0945  ceFAZolin  (ANCEF ) IVPB 3g/150 mL premix        3 g 300 mL/hr over 30 Minutes Intravenous  Once 05/21/24 0944 05/21/24 1042       Subjective: Seen and examined at bedside and was still having pain.  Resting.  States that he is coughing up some yellowish phlegm.  No nausea or vomiting.  Spiked another temperature overnight and was more somnolent so to be given Narcan .  Palliative consulted for further pain control and adjustments.  Objective: Vitals:   05/23/24 0435 05/23/24 0719 05/23/24 1245 05/23/24 1450  BP: 127/74 135/72 (!) 145/69 (!) 148/80  Pulse: (!) 105 (!) 107 97 97  Resp: 17 16 16 16   Temp: 99.9 F (37.7 C) 98.4 F (36.9 C) (!) 97.4 F (36.3 C) (!) 97.4 F (36.3 C)  TempSrc: Oral  Oral   SpO2: 94% 97% 98% 98%  Weight:  Height:        Intake/Output Summary (Last 24 hours) at 05/23/2024 1600 Last data filed at 05/23/2024 0700 Gross per 24 hour  Intake 953.11 ml  Output 600 ml  Net 353.11 ml   Filed Weights   05/21/24 0844  Weight: (!) 142.9 kg   Examination: Physical Exam:  Constitutional: WN/WD morbidly obese African-American male in no acute distress Respiratory: Diminished to auscultation bilaterally, no wheezing, rales, rhonchi or crackles. Normal respiratory effort and patient is not tachypenic. No accessory muscle use.  Unlabored breathing Cardiovascular: RRR, no murmurs / rubs / gallops. S1 and S2 auscultated. No extremity edema.  Abdomen: Soft, non-tender,  distended secondary body habitus. Bowel sounds positive.  GU: Deferred. Musculoskeletal: Right is in a sling and surgical incisions are covered Skin: No rashes, lesions, ulcers limited skin evaluation. No induration; Warm and dry.  Neurologic: CN 2-12 grossly intact with no focal deficits. Romberg sign and cerebellar reflexes not assessed.  Psychiatric: Appears calm but is fully awake and alert  Data Reviewed: I have personally reviewed following labs and imaging studies  CBC: Recent Labs  Lab 05/17/24 0749 05/21/24 0243 05/22/24 0201 05/22/24 2253 05/23/24 0552  WBC 9.9 7.8 9.0 9.8 9.2  NEUTROABS 7.1  --   --   --  6.0  HGB 9.6* 9.6* 9.3* 10.5* 9.5*  HCT 30.0* 30.6* 29.1* 33.8* 30.6*  MCV 92.6 94.2 91.8 94.2 93.6  PLT PLATELET CLUMPS NOTED ON SMEAR, UNABLE TO ESTIMATE 106* PLATELET CLUMPS NOTED ON SMEAR, UNABLE TO ESTIMATE PLATELET CLUMPS NOTED ON SMEAR, UNABLE TO ESTIMATE PLATELET CLUMPS NOTED ON SMEAR, UNABLE TO ESTIMATE   Basic Metabolic Panel: Recent Labs  Lab 05/17/24 0528 05/21/24 0243 05/22/24 0022 05/22/24 0201 05/22/24 2253 05/23/24 0552  NA 133* 134* 132*  --  135 137  K 4.4 4.2 4.3  --  4.7 4.3  CL 98 100 97*  --  99 100  CO2 26 27 26   --  29 30  GLUCOSE 117* 129* 163*  --  125* 111*  BUN 14 13 12   --  13 12  CREATININE 0.76 0.76 0.76  --  0.83 0.71  CALCIUM 8.4* 8.5* 8.1*  --  8.6* 8.5*  MG  --  1.7  --  1.7  --  1.8  PHOS  --   --   --  5.2*  --  4.9*   GFR: Estimated Creatinine Clearance: 167.7 mL/min (by C-G formula based on SCr of 0.71 mg/dL). Liver Function Tests: Recent Labs  Lab 05/17/24 0528 05/22/24 0201 05/23/24 0552  AST 18 23 18   ALT 34 37 37  ALKPHOS 79 101 102  BILITOT 0.4 0.3 0.6  PROT 6.3* 6.4* 6.5  ALBUMIN  2.3* 2.3* 2.3*   No results for input(s): LIPASE, AMYLASE in the last 168 hours. No results for input(s): AMMONIA in the last 168 hours. Coagulation Profile: No results for input(s): INR, PROTIME in the last 168  hours. Cardiac Enzymes: No results for input(s): CKTOTAL, CKMB, CKMBINDEX, TROPONINI in the last 168 hours. BNP (last 3 results) No results for input(s): PROBNP in the last 8760 hours. HbA1C: No results for input(s): HGBA1C in the last 72 hours. CBG: Recent Labs  Lab 05/22/24 2205  GLUCAP 108*   Lipid Profile: No results for input(s): CHOL, HDL, LDLCALC, TRIG, CHOLHDL, LDLDIRECT in the last 72 hours. Thyroid  Function Tests: No results for input(s): TSH, T4TOTAL, FREET4, T3FREE, THYROIDAB in the last 72 hours. Anemia Panel: No results for input(s): VITAMINB12, FOLATE,  FERRITIN, TIBC, IRON, RETICCTPCT in the last 72 hours. Sepsis Labs: No results for input(s): PROCALCITON, LATICACIDVEN in the last 168 hours.  Recent Results (from the past 240 hours)  Culture, blood (Routine X 2) w Reflex to ID Panel     Status: None (Preliminary result)   Collection Time: 05/21/24 10:58 PM   Specimen: BLOOD  Result Value Ref Range Status   Specimen Description BLOOD BLOOD LEFT HAND  Final   Special Requests   Final    BOTTLES DRAWN AEROBIC AND ANAEROBIC Blood Culture results may not be optimal due to an inadequate volume of blood received in culture bottles   Culture   Final    NO GROWTH 2 DAYS Performed at [facility], [address], [place], [place] [address]    Report Status PENDING  Incomplete  Culture, blood (Routine X 2) w Reflex to ID Panel     Status: None (Preliminary result)   Collection Time: 05/21/24 10:58 PM   Specimen: BLOOD  Result Value Ref Range Status   Specimen Description BLOOD BLOOD LEFT ARM  Final   Special Requests   Final    BOTTLES DRAWN AEROBIC ONLY Blood Culture results may not be optimal due to an inadequate volume of blood received in culture bottles   Culture   Final    NO GROWTH 2 DAYS Performed at [facility], [address] [place], [place] [address]    Report Status PENDING  Incomplete     Radiology Studies: DG CHEST PORT 1 VIEW Result Date: 05/22/2024 CLINICAL DATA:  Respiratory compromise EXAM: PORTABLE CHEST 1 VIEW COMPARISON:  Chest x-ray 05/22/2024 FINDINGS: Lung volumes are low. This likely accentuating central pulmonary vascularity. There is no lung consolidation, pleural effusion or pneumothorax. Cardiomediastinal silhouette is within normal limits. Cervical/thoracic spinal fusion hardware is present. No acute fractures are seen. IMPRESSION: Low lung volumes. No acute cardiopulmonary process. Electronically Signed   By: Greig Pique M.D.   On: 05/22/2024 22:52   DG CHEST PORT 1 VIEW Result Date: 05/22/2024 EXAM: 1 VIEW(S) XRAY OF THE CHEST 05/22/2024 03:42:00 PM COMPARISON: 05/12/2024 CLINICAL HISTORY: Fever FINDINGS: LUNGS AND PLEURA: Low lung volumes. Elevated right hemidiaphragm. Bilateral pulmonary nodules better visualized on prior chest CT. Mild pulmonary vascular congestion is slightly improved from the prior exam. No pleural effusion. No pneumothorax. HEART AND MEDIASTINUM: No acute abnormality of the cardiac and mediastinal silhouettes. BONES AND SOFT TISSUES: Cervicothoracic surgical hardware noted. No acute osseous abnormality. IMPRESSION: 1. Mild pulmonary vascular congestion, slightly improved from the prior exam. 2. Otherwise stable exam Electronically signed by: Lonni Necessary MD 05/22/2024 07:57 PM EST RP Workstation: [id number]   Scheduled Meds:  apixaban   5 mg Oral BID   Chlorhexidine  Gluconate Cloth  6 each Topical Daily   cyclobenzaprine   10 mg Oral TID   lidocaine   1 patch Transdermal Daily   methadone   5 mg Oral Q8H   methocarbamol   750 mg Oral TID AC & HS   multivitamin with minerals  1 tablet Oral Daily   mupirocin  ointment  1 Application Nasal BID   polyethylene glycol  17 g Oral BID   pregabalin   50 mg Oral TID   Ensure Max Protein  11 oz Oral BID   senna-docusate  2 tablet Oral BID   Continuous Infusions:  sodium chloride  75 mL/hr at  05/22/24 1541    LOS: 2 days   Joseph Marker, DO [facility] Available via Epic secure chat 7am-7pm After these  hours, please refer to coverage provider listed on [email or web address] 05/23/2024, 4:00 PM

## 2024-05-23 NOTE — Consult Note (Signed)
 Consultation Note Date: 05/23/2024   Patient Name: Joseph [name]  DOB: [birth year]  MRN: [id number]  Age / Sex: 43 y.o., male  PCP: Patient, No Pcp Per Referring Physician: Sherrill Alejandro Donovan, DO  Reason for Consultation: Pain control  HPI/Patient Profile: 43 y.o. male  with past medical history of childhood asthma, polysubstance abuse, and recently diagnosed metastatic papillary thyroid  cancer admitted from CIR on 05/21/2024 with pathologic right proximal humerus fracture.   Patient initially presented to the hospital on 04/27/2024 with back pain and difficulty ambulating. He was found to have pulmonary embolism, pathologic T2 fracture, and large thyroid  nodule. Papillary carcinoma was diagnosed with thyroid  biopsy from 05/04/2024.  He underwent T1-T3 laminectomy and T2 corpectomy for resection of metastatic tumor on 05/05/2024. Patient was seen by PMT for pain management during this admission. He was discharged to inpatient rehabilitation on 05/16/2024.   PMT has been consulted to assist with pain management s/p ORIF of proximal humerus fracture and excessive sedation requiring narcan .  Clinical Assessment and Goals of Care:  I have reviewed medical records including EPIC notes, labs and imaging, assessed the patient and then met at the bedside with to discuss pain management options.  I introduced Palliative Medicine as specialized medical care for people living with serious illness. It focuses on providing relief from the symptoms and stress of a serious illness. The goal is to improve quality of life for both the patient and the family. He is familiar with PMT from previous visits.  Palliative Symptoms: Pain in right shoulder and across shoulder blades, rated 9 out of 10  Discussion: Patient describes his pain before his surgical repair as a 15 out of 10. He states that IV fentanyl  has worked in the past. He is not sure why he is no longer on scheduled  APAP.  Patient feels that even with previous regimen discussed with PMT, the pain was only ever dulled and not as effective as he would like.  He is open to trying opioid rotation with other medications including options of methadone , fentanyl  patch.  Discussed risks and benefits and process of titrating.  Patient verbalized understanding.  In the past 24 hours, patient has taken 5 doses of as needed oxy 1 mg as well as 1 dose of his long-acting OxyContin  100 mg.  Total MME of 250 mg.  Reviewed dose reduction for cross tolerance with patient.  PDMP reviewed.  Patient previously has been arranged for outpatient palliative care follow-up in the cancer center.   Discussed the importance of continued conversation with family and the medical providers regarding overall plan of care and treatment options, ensuring decisions are within the context of the patient's values and GOCs.   Questions and concerns were addressed.The family was encouraged to call with questions or concerns.  PMT will continue to support holistically.   SUMMARY OF RECOMMENDATIONS   -Discontinued Oxycontin  ER -Ordered Methadone  5 mg Q8H for opioid rotation given ineffectiveness of Oxycodone  and increased adverse effects/sedation -Continue Oxycodone  20 mg IR Q4H for breakthrough pain -Ordered Fentanyl  IV 25 mcg Q3H for breakthrough pain uncontrolled with PRN Oxycodone  -PMT will continue to follow and support  Prognosis:  Unable to determine  Discharge Planning: To Be Determined      Primary Diagnoses: Present on Admission:  Pathological fracture of right humerus  Papillary thyroid  carcinoma (HCC)  Metastatic cancer to spine [facility])  Malignant neoplasm metastatic to both lungs (HCC)  Incomplete paraplegia (HCC)  Pulmonary embolism (HCC)  Normocytic anemia  Pathologic  humeral fracture   Physical Exam Vitals and nursing note reviewed.  Constitutional:      General: He is not in acute distress.    Appearance: He is  obese. He is ill-appearing.  HENT:     Head: Normocephalic and atraumatic.  Cardiovascular:     Rate and Rhythm: Tachycardia present.  Pulmonary:     Effort: Pulmonary effort is normal.  Skin:    General: Skin is warm and dry.  Neurological:     Mental Status: He is alert.     Comments: Appears very fatigued  Psychiatric:        Behavior: Behavior is cooperative.     Vital Signs: BP 135/72 (BP Location: Left Arm)   Pulse (!) 107   Temp 98.4 F (36.9 C)   Resp 16   Ht 5' 9 (1.753 m)   Wt (!) 142.9 kg   SpO2 97%   BMI 46.52 kg/m  Pain Scale: 0-10 POSS *See Group Information*: 4-INTERVENTION REQUIRED,Unacceptable,Somnolent, mininal or no response to verbal and physical stimulation Pain Score: 10-Worst pain ever   SpO2: SpO2: 97 % O2 Device:SpO2: 97 % O2 Flow Rate: .O2 Flow Rate (L/min): 1 L/min   Joseph Whittinghill [name], PA-C  Palliative Medicine Team Team phone # [phone]  Thank you for allowing the Palliative Medicine Team to assist in the care of this patient. Please utilize secure chat with additional questions, if there is no response within 30 minutes please call the above phone number.  Palliative Medicine Team providers are available by phone from 7am to 7pm daily and can be reached through the team cell phone.  Should this patient require assistance outside of these hours, please call the patient's attending physician.     Time Total: 75  Visit consisted of counseling and education dealing with the complex and emotionally intense issues of symptom management and palliative care in the setting of serious and potentially life-threatening illness. Greater than 50% of this time was spent counseling and coordinating care related to the above assessment and plan.  Personally spent 75 minutes in patient care including extensive chart review (labs, imaging, progress/consult notes, vital signs), medically appropraite exam, discussed with treatment team, education to  patient, family, and staff, documenting clinical information, medication review and management, coordination of care, and available advanced directive documents.

## 2024-05-23 NOTE — Progress Notes (Signed)
 Inpatient Rehabilitation Care Coordinator Discharge Note   Patient Details  Name: Joseph [name] MRN: [id number] Date of Birth: 07-29-1980   Discharge location: D/c to acute for OR procedure  Length of Stay: 4 days  Discharge activity level: Total Joseph  Home/community participation: Limited  Patient response un:Yzjouy Literacy - How often do you need to have someone help you when you read instructions, pamphlets, or other written material from your doctor or pharmacy?: Rarely  Patient response un:Dnrpjo Isolation - How often do you feel lonely or isolated from those around you?: Patient unable to respond  Services provided included: MD, RD, OT, PT, RN, CM, TR, Pharmacy, Neuropsych, SW  Financial Services:  Field Seismologist Utilized: Private Insurance [employer]  Choices offered to/list presented to: N/Joseph  Follow-up services arranged:              Patient response to transportation need: Is the patient able to respond to transportation needs?: Yes In the past 12 months, has lack of transportation kept you from medical appointments or from getting medications?: No In the past 12 months, has lack of transportation kept you from meetings, work, or from getting things needed for daily living?: No   Patient/Family verbalized understanding of follow-up arrangements:  Yes  Individual responsible for coordination of the follow-up plan: contact pt or pt mother Joseph [name] [phone]  Confirmed correct DME delivered: Joseph [name] 05/23/2024    Comments (or additional information):  Summary of Stay    Date/Time Discharge Planning CSW  05/17/24 1118 TBA AAC       Joseph [name] Joseph [name]

## 2024-05-23 NOTE — Progress Notes (Signed)
 PATIENT ID: Joseph [name]  MRN: [id number]  DOB/AGE:  June 17, 1981 / 43 y.o.  2 Days Post-Op Procedure(s) (LRB): INSERTION, INTRAMEDULLARY ROD, HUMERUS (Right)  Subjective: Patient reports moderate pain in the right shoulder and arm. Asking for more pain medication.   Objective: Vital signs in last 24 hours: Temp:  [98 F (36.7 C)-101 F (38.3 C)] 98.4 F (36.9 C) (12/01 0719) Pulse Rate:  [105-128] 107 (12/01 0719) Resp:  [16-19] 16 (12/01 0719) BP: (121-138)/(60-90) 135/72 (12/01 0719) SpO2:  [92 %-100 %] 97 % (12/01 0719)  Intake/Output from previous day: 11/30 0701 - 12/01 0700 In: 1313.1 [P.O.:840; I.V.:473.1] Out: 1825 [Urine:1825] Intake/Output this shift: No intake/output data recorded.  Recent Labs    05/21/24 0243 05/22/24 0201 05/22/24 2253 05/23/24 0552  HGB 9.6* 9.3* 10.5* 9.5*   Recent Labs    05/22/24 2253 05/23/24 0552  WBC 9.8 9.2  RBC 3.59* 3.27*  HCT 33.8* 30.6*  PLT PLATELET CLUMPS NOTED ON SMEAR, UNABLE TO ESTIMATE PLATELET CLUMPS NOTED ON SMEAR, UNABLE TO ESTIMATE   Recent Labs    05/22/24 2253 05/23/24 0552  NA 135 137  K 4.7 4.3  CL 99 100  CO2 29 30  BUN 13 12  CREATININE 0.83 0.71  GLUCOSE 125* 111*  CALCIUM 8.6* 8.5*     Physical Exam: Alert and oriented Sensation intact distally RUE Intact pulses distally RUE Incision: dressing C/D/I No cellulitis present Sling in place RUE  Assessment/Plan: 2 Days Post-Op Procedure(s) (LRB): INSERTION, INTRAMEDULLARY ROD, HUMERUS (Right)   Advance diet Up with therapy- work with OT, hand, wrist elbow only Weight Bearing as Tolerated (WBAT) RUE VTE prophylaxis: Eliquis    Follow up in office in 2 weeks for recheck and updated xrays. Patient to remain in sling until then.    Today's  total administered Morphine  Milligram Equivalents: 30 Yesterday's total administered Morphine  Milligram Equivalents: 450  Ival Basquez L. [place], PA-C 05/23/2024, 8:34 AM

## 2024-05-23 NOTE — Plan of Care (Signed)
  Problem: Clinical Measurements: Goal: Respiratory complications will improve Outcome: Progressing Goal: Cardiovascular complication will be avoided Outcome: Progressing   Problem: Activity: Goal: Risk for activity intolerance will decrease Outcome: Progressing   Problem: Pain Managment: Goal: General experience of comfort will improve and/or be controlled Outcome: Progressing   Problem: Skin Integrity: Goal: Risk for impaired skin integrity will decrease Outcome: Progressing

## 2024-05-23 NOTE — Progress Notes (Signed)
 Occupational Therapy Treatment Patient Details Name: Joseph [name] MRN: [id number] DOB: 1981-03-09 Today's Date: 05/23/2024   History of present illness Pt is a 43 y.o. male presenting 11/28 from CIR with R shoulder pain; found to have R humeral pathologic fx. S/p IM rod placement 11/29. 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 At CIR 11/24-11/28. PMH: cocaine use disorder, cannibis use disorder.   OT comments  Pt progressing toward established OT goals and motivated to mobilize this session. Pt needing mod A +2 for bed mobility and min-mod A for sitting balance at EOB with fatigue. Able to support self for short periods with L UE support with CGA. Pt eager to perform transfer this session; engaged in STS transfer with mod-max A +2 and min A to maintain upright standing with LUE support ~45 seconds. Engaged in RUE HEP and repositioned at end of session. Will continue to follow. Pt remains and excellent candidate for intensive inpatient rehab > 3 hours.       If plan is discharge home, recommend the following:  A lot of help with walking and/or transfers;Two people to help with walking and/or transfers;A lot of help with bathing/dressing/bathroom;Two people to help with bathing/dressing/bathroom   Equipment Recommendations  Other (comment) (defer)    Recommendations for Other Services Rehab consult    Precautions / Restrictions Precautions Precautions: Fall;Back;Shoulder Type of Shoulder Precautions: elbow/wrist/hand ROM only, WBAT for transfers per op note Precaution Booklet Issued: Yes (comment) Recall of Precautions/Restrictions: Intact Required Braces or Orthoses: Sling Restrictions Weight Bearing Restrictions Per Provider Order: Yes RUE Weight Bearing Per Provider Order:  Weight bearing as tolerated RLE Weight Bearing Per Provider Order: Weight bearing as tolerated LLE Weight Bearing Per Provider Order: Weight bearing as tolerated Other Position/Activity Restrictions: Per Dr. Dozier We will allow weightbearing to tolerance given his lower extremity paraplegia and need for his upper extremities       Mobility Bed Mobility Overal bed mobility: Needs Assistance Bed Mobility: Supine to Sit, Sit to Supine, Rolling Rolling: +2 for safety/equipment, +2 for physical assistance, Min assist   Supine to sit: Mod assist, +2 for safety/equipment, +2 for physical assistance Sit to supine: Mod assist, +2 for physical assistance, +2 for safety/equipment   General bed mobility comments: assist for trunk and LE management, scooting to/from EOB with bed pad, pt able to boost self up with LUE and trendelenburg upon return to supine    Transfers Overall transfer level: Needs assistance Equipment used: Ambulation equipment used Transfers: Sit to/from Stand Sit to Stand: Max assist, +2 physical assistance, +2 safety/equipment, Via lift equipment           General transfer comment: STS with bed pad boost and use of stedy with heavy mod-max A +2 Transfer via Lift Equipment: Stedy   Balance Overall balance assessment: History of Falls, Needs assistance Sitting-balance support: Feet supported Sitting balance-Leahy Scale: Poor     Standing balance support: Single extremity supported, During functional activity, Reliant on assistive device for balance Standing balance-Leahy Scale: Poor Standing balance comment: +2 A for STS and then min A+2 for standing balance with cues for hip extension. pt fatiguing after ~45 seconds needing to return to supine  ADL either performed or assessed with clinical judgement   ADL Overall ADL's : Needs assistance/impaired                         Toilet Transfer: Moderate  assistance;Maximal assistance;+2 for physical assistance;+2 for safety/equipment Toilet Transfer Details (indicate cue type and reason): STS with stedy this session                Extremity/Trunk Assessment Upper Extremity Assessment Upper Extremity Assessment: RUE deficits/detail RUE Deficits / Details: elbow/wrist/hand ROM WFL. orders for elbow/wrist/hand ROM only. handout provided   Lower Extremity Assessment Lower Extremity Assessment: Defer to PT evaluation        Vision   Vision Assessment?: No apparent visual deficits   Perception     Praxis     Communication Communication Communication: No apparent difficulties   Cognition Arousal: Alert Behavior During Therapy: WFL for tasks assessed/performed, Anxious Cognition: No apparent impairments                               Following commands: Intact        Cueing   Cueing Techniques: Verbal cues  Exercises Exercises: Other exercises Other Exercises Other Exercises: AROM elbow/wrist/hand elbow flexion/extension 10x, composite flexion/extension 10x, wrist flexion/extension 10x, pronation/supination 10x Other Exercises: scapular elevation/depression, protraction/retraction without engagement of ROM at shoulder joint    Shoulder Instructions       General Comments VSS on 2 L O2    Pertinent Vitals/ Pain       Pain Assessment Pain Assessment: Faces Faces Pain Scale: Hurts little more Pain Location: R shoulder, back Pain Descriptors / Indicators: Constant, Discomfort, Grimacing, Guarding Pain Intervention(s): Limited activity within patient's tolerance, Monitored during session, Premedicated before session  Home Living                                          Prior Functioning/Environment              Frequency  Min 2X/week        Progress Toward Goals  OT Goals(current goals can now be found in the care plan section)  Progress towards OT goals: Progressing  toward goals  Acute Rehab OT Goals Patient Stated Goal: get better OT Goal Formulation: With patient Time For Goal Achievement: 06/05/24 Potential to Achieve Goals: Good ADL Goals Pt Will Perform Grooming: with set-up;sitting Pt Will Perform Upper Body Bathing: with min assist;sitting Pt Will Transfer to Toilet: with min assist;with +2 assist;stand pivot transfer Pt/caregiver will Perform Home Exercise Program: Right Upper extremity;With written HEP provided  Plan      Co-evaluation                 AM-PAC OT 6 Clicks Daily Activity     Outcome Measure   Help from another person eating meals?: None Help from another person taking care of personal grooming?: A Little Help from another person toileting, which includes using toliet, bedpan, or urinal?: Total Help from another person bathing (including washing, rinsing, drying)?: A Lot Help from another person to put on and taking off regular upper body clothing?: A Lot Help from another person to put on and taking off regular lower body clothing?: Total 6 Click Score: 13    End of Session Equipment Utilized During Treatment:  Other (comment) (R shoulder sling)  OT Visit Diagnosis: Unsteadiness on feet (R26.81);Other abnormalities of gait and mobility (R26.89);Muscle weakness (generalized) (M62.81);History of falling (Z91.81);Pain Pain - Right/Left: Right Pain - part of body: Shoulder   Activity Tolerance Patient tolerated treatment well   Patient Left in bed;with call bell/phone within reach   Nurse Communication Mobility status        Time: 1415-1440 OT Time Calculation (min): 25 min  Charges: OT General Charges $OT Visit: 1 Visit OT Treatments $Therapeutic Activity: 23-37 mins  Elma JONETTA Lebron FREDERICK, OTR/L [facility] Acute Rehabilitation Office: [phone]   Elma JONETTA Lebron 05/23/2024, 3:36 PM

## 2024-05-23 NOTE — Progress Notes (Signed)
 Inpatient Rehab Admissions Coordinator:   Consult received and chart reviewed.  Readmitted to the acute setting after pathological R humerus fx discovered on CIR.  Admitted for R humerus IM nail.  Post operatively he is requiring +2 assist for mobility.  Pain is limiting factor and currently working with palliative medicine on regimen.  We will follow.  Not ready to return to CIR yet until pain under better control without use of IV medications.    Reche Lowers, PT, DPT Admissions Coordinator [phone] 05/23/24 4:03 PM

## 2024-05-23 NOTE — Progress Notes (Signed)
 Inpatient Rehabilitation Discharge Medication Review by a Pharmacist   A complete drug regimen review was completed for this patient to identify any potential clinically significant medication issues.   High Risk Drug Classes Is patient taking? Indication by Medication  Antipsychotic No    Anticoagulant Yes Apixaban  - acute PE  Antibiotic No    Opioid Yes Oxycodone  ER and  OxyIR pain management  Antiplatelet No    Hypoglycemics/insulin No    Vasoactive Medication No    Chemotherapy No    Other Yes Flexeril , methocarbamol - muscle spams Pregabalin  - chronic pain/neuropathic pain  PEG, senokot -S - bowel regimen/ constipation Multivitamin - supplement         Type of Medication Issue Identified Description of Issue Recommendation(s)  Drug Interaction(s) (clinically significant)        Duplicate Therapy        Allergy        No Medication Administration End Date        Incorrect Dose        Additional Drug Therapy Needed        Significant med changes from prior encounter (inform family/care partners about these prior to discharge). No medications PTA (initial acute care admission 04/27/24).    Communicate new medications instructions  to patient /family/ caregiver prior to discharge.   Other            Clinically significant medication issues were identified that warrant physician communication and completion of prescribed/recommended actions by midnight of the next day:  No   Name of provider notified for urgent issues identified:    Provider Method of Notification:      Pharmacist comments:    Time spent performing this drug regimen review (minutes):  10   Levorn Gaskins, RPh Clinical Pharmacist   Gaskins Levorn Requena 05/23/2024 12:39 PM

## 2024-05-23 NOTE — Plan of Care (Signed)
   Problem: Clinical Measurements: Goal: Respiratory complications will improve Outcome: Progressing Goal: Cardiovascular complication will be avoided Outcome: Progressing   Problem: Activity: Goal: Risk for activity intolerance will decrease Outcome: Progressing   Problem: Nutrition: Goal: Adequate nutrition will be maintained Outcome: Progressing

## 2024-05-24 DIAGNOSIS — G8222 Paraplegia, incomplete: Secondary | ICD-10-CM | POA: Diagnosis not present

## 2024-05-24 DIAGNOSIS — M84521A Pathological fracture in neoplastic disease, right humerus, initial encounter for fracture: Secondary | ICD-10-CM | POA: Diagnosis not present

## 2024-05-24 DIAGNOSIS — Z7189 Other specified counseling: Secondary | ICD-10-CM | POA: Diagnosis not present

## 2024-05-24 DIAGNOSIS — C73 Malignant neoplasm of thyroid gland: Secondary | ICD-10-CM | POA: Diagnosis not present

## 2024-05-24 DIAGNOSIS — C7801 Secondary malignant neoplasm of right lung: Secondary | ICD-10-CM | POA: Diagnosis not present

## 2024-05-24 DIAGNOSIS — I2699 Other pulmonary embolism without acute cor pulmonale: Secondary | ICD-10-CM | POA: Diagnosis not present

## 2024-05-24 DIAGNOSIS — C7951 Secondary malignant neoplasm of bone: Secondary | ICD-10-CM | POA: Diagnosis not present

## 2024-05-24 DIAGNOSIS — C7802 Secondary malignant neoplasm of left lung: Secondary | ICD-10-CM | POA: Diagnosis not present

## 2024-05-24 DIAGNOSIS — D649 Anemia, unspecified: Secondary | ICD-10-CM | POA: Diagnosis not present

## 2024-05-24 DIAGNOSIS — G893 Neoplasm related pain (acute) (chronic): Secondary | ICD-10-CM | POA: Diagnosis not present

## 2024-05-24 DIAGNOSIS — M84521D Pathological fracture in neoplastic disease, right humerus, subsequent encounter for fracture with routine healing: Secondary | ICD-10-CM

## 2024-05-24 DIAGNOSIS — Z515 Encounter for palliative care: Secondary | ICD-10-CM | POA: Diagnosis not present

## 2024-05-24 LAB — CBC WITH DIFFERENTIAL/PLATELET
Abs Immature Granulocytes: 0.18 K/uL — ABNORMAL HIGH (ref 0.00–0.07)
Basophils Absolute: 0 K/uL (ref 0.0–0.1)
Basophils Relative: 0 %
Eosinophils Absolute: 0.3 K/uL (ref 0.0–0.5)
Eosinophils Relative: 4 %
HCT: 28.9 % — ABNORMAL LOW (ref 39.0–52.0)
Hemoglobin: 9.1 g/dL — ABNORMAL LOW (ref 13.0–17.0)
Immature Granulocytes: 2 %
Lymphocytes Relative: 21 %
Lymphs Abs: 1.6 K/uL (ref 0.7–4.0)
MCH: 28.9 pg (ref 26.0–34.0)
MCHC: 31.5 g/dL (ref 30.0–36.0)
MCV: 91.7 fL (ref 80.0–100.0)
Monocytes Absolute: 0.7 K/uL (ref 0.1–1.0)
Monocytes Relative: 9 %
Neutro Abs: 4.9 K/uL (ref 1.7–7.7)
Neutrophils Relative %: 64 %
Platelets: UNDETERMINED K/uL (ref 150–400)
RBC: 3.15 MIL/uL — ABNORMAL LOW (ref 4.22–5.81)
RDW: 13.2 % (ref 11.5–15.5)
WBC: 7.7 K/uL (ref 4.0–10.5)
nRBC: 0 % (ref 0.0–0.2)

## 2024-05-24 LAB — RETICULOCYTES
Immature Retic Fract: 20.5 % — ABNORMAL HIGH (ref 2.3–15.9)
RBC.: 3.18 MIL/uL — ABNORMAL LOW (ref 4.22–5.81)
Retic Count, Absolute: 135.8 K/uL (ref 19.0–186.0)
Retic Ct Pct: 4.3 % — ABNORMAL HIGH (ref 0.4–3.1)

## 2024-05-24 LAB — COMPREHENSIVE METABOLIC PANEL WITH GFR
ALT: 36 U/L (ref 0–44)
AST: 22 U/L (ref 15–41)
Albumin: 2.2 g/dL — ABNORMAL LOW (ref 3.5–5.0)
Alkaline Phosphatase: 93 U/L (ref 38–126)
Anion gap: 8 (ref 5–15)
BUN: 12 mg/dL (ref 6–20)
CO2: 27 mmol/L (ref 22–32)
Calcium: 8.4 mg/dL — ABNORMAL LOW (ref 8.9–10.3)
Chloride: 102 mmol/L (ref 98–111)
Creatinine, Ser: 0.74 mg/dL (ref 0.61–1.24)
GFR, Estimated: >60 mL/min (ref >60)
Glucose, Bld: 113 mg/dL — ABNORMAL HIGH (ref 70–99)
Potassium: 4.2 mmol/L (ref 3.5–5.1)
Sodium: 137 mmol/L (ref 135–145)
Total Bilirubin: 0.4 mg/dL (ref 0.0–1.2)
Total Protein: 6.4 g/dL — ABNORMAL LOW (ref 6.5–8.1)

## 2024-05-24 LAB — IRON AND TIBC
Iron: 35 ug/dL — ABNORMAL LOW (ref 45–182)
Saturation Ratios: 13 % — ABNORMAL LOW (ref 17.9–39.5)
TIBC: 267 ug/dL (ref 250–450)
UIBC: 232 ug/dL

## 2024-05-24 LAB — SURGICAL PATHOLOGY

## 2024-05-24 LAB — VITAMIN B12: Vitamin B-12: 414 pg/mL (ref 180–914)

## 2024-05-24 LAB — FERRITIN: Ferritin: 61 ng/mL (ref 24–336)

## 2024-05-24 LAB — FOLATE: Folate: 11.8 ng/mL (ref >5.9)

## 2024-05-24 LAB — PHOSPHORUS: Phosphorus: 4.6 mg/dL (ref 2.5–4.6)

## 2024-05-24 LAB — MAGNESIUM: Magnesium: 1.7 mg/dL (ref 1.7–2.4)

## 2024-05-24 MED ORDER — SODIUM CHLORIDE 0.9 % IV SOLN: INTRAVENOUS | Status: AC

## 2024-05-24 MED ORDER — MAGNESIUM SULFATE 2 GM/50ML IV SOLN
2.0000 g | Freq: Once | INTRAVENOUS | Status: AC
  Administered 2024-05-24: 2 g via INTRAVENOUS
  Filled 2024-05-24: qty 50

## 2024-05-24 MED ORDER — POLYSACCHARIDE IRON COMPLEX 150 MG PO CAPS
150.0000 mg | ORAL_CAPSULE | Freq: Every day | ORAL | Status: DC
  Administered 2024-05-24 – 2024-06-02 (×10): 150 mg via ORAL
  Filled 2024-05-24 (×10): qty 1

## 2024-05-24 NOTE — Progress Notes (Signed)
 PATIENT ID: Joseph [name]  MRN: [id number]  DOB/AGE:  43-Aug-1982 / 43 y.o.  3 Days Post-Op Procedure(s) (LRB): INSERTION, INTRAMEDULLARY ROD, HUMERUS (Right)  Subjective: Patient reports that he is feeling achy and tired all over. Patient easily awaken during rounds  Objective: Vital signs in last 24 hours: Temp:  [97.4 F (36.3 C)-99.2 F (37.3 C)] 98.4 F (36.9 C) (12/02 0753) Pulse Rate:  [97-113] 109 (12/02 0753) Resp:  [16-20] 20 (12/02 0753) BP: (120-148)/(67-80) 126/73 (12/02 0753) SpO2:  [96 %-98 %] 96 % (12/02 0753)  Intake/Output from previous day: 12/01 0701 - 12/02 0700 In: 0.2 [I.V.:0.2] Out: 700 [Urine:700]   Recent Labs    05/22/24 0201 05/22/24 2253 05/23/24 0552 05/24/24 0435  HGB 9.3* 10.5* 9.5* 9.1*   Recent Labs    05/23/24 0552 05/24/24 0435  WBC 9.2 7.7  RBC 3.27* 3.15*  3.18*  HCT 30.6* 28.9*  PLT PLATELET CLUMPS NOTED ON SMEAR, UNABLE TO ESTIMATE PLATELET CLUMPS NOTED ON SMEAR, UNABLE TO ESTIMATE   Recent Labs    05/23/24 0552 05/24/24 0435  NA 137 137  K 4.3 4.2  CL 100 102  CO2 30 27  BUN 12 12  CREATININE 0.71 0.74  GLUCOSE 111* 113*  CALCIUM 8.5* 8.4*     Physical Exam: Alert and oriented Sensation intact distally RUE Intact pulses distally RUE Incision: dressing C/D/I No cellulitis present Sling in place RUE   Assessment/Plan: 3 Days Post-Op Procedure(s) (LRB): INSERTION, INTRAMEDULLARY ROD, HUMERUS (Right)   Advance diet Up with therapy- work with OT, hand, wrist elbow only Weight Bearing as Tolerated (WBAT) RUE VTE prophylaxis: Eliquis    No concerns from ortho about localized infection in the right shoulder. Follow up in office in 2 weeks for recheck and updated xrays. Patient to remain in sling until then.     Today's  total administered Morphine  Milligram Equivalents: [phone] Yesterday's total administered Morphine  Milligram Equivalents: [phone]  Yesha Muchow L. Porter[place], PA-C 05/24/2024, 8:46 AM

## 2024-05-24 NOTE — Progress Notes (Signed)
 Inpatient Rehab Admissions Coordinator:   Discussed with Dr. Cornelio PMR, Dr. Sherrill hospitalist, Dr. Darnella neurosurgery, Dr. Dewey and Donald Husband radiation oncology, Dr. Tina ENT  regarding plan for this patient.  Per discussion plan to f/u with NS this week for assessment, plan to discuss pt at brain/spine conference on Monday 12/8.  Dr. Tina to see pt for consult this week as well.  Still working on pain control.  We will continue to follow for medical progression/plan before revisiting CIR discussions.    Joseph [name], PT, DPT Admissions Coordinator [phone] 05/24/24 1:56 PM

## 2024-05-24 NOTE — Progress Notes (Signed)
 Physical Therapy Treatment Patient Details Name: Joseph [name] MRN: [id number] DOB: 11-18-1980 Today's Date: 05/24/2024   History of Present Illness Pt is a 43 y.o. male presenting 11/28 from CIR with R shoulder pain; found to have R humeral pathologic fx. S/p IM rod placement 11/29. 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 At CIR 11/24-11/28. PMH: cocaine use disorder, cannibis use disorder.    PT Comments  Pt resting in bed on arrival, motivated for mobility and demonstrating good progress towards acute goals. Pt able to come to sitting on L EOB with mod A to manage Les and upright trunk. Pt standing x3 in stedy frame with up to max A to stand from EOB fading to min A from stedy seat. Pt able to maintain standing with close CGA ~30 seconds each stand with standing tolerance limited by LE weakness. Pt returning to supine at end of session. Patient will benefit from intensive inpatient follow-up therapy, >3 hours/day, will continue to follow acutely.     If plan is discharge home, recommend the following: Two people to help with walking and/or transfers;Two people to help with bathing/dressing/bathroom   Can travel by private vehicle        Equipment Recommendations  Other (comment) (defer to next venue)    Recommendations for Other Services       Precautions / Restrictions Precautions Precautions: Fall;Back;Shoulder Type of Shoulder Precautions: elbow/wrist/hand ROM only, WBAT for transfers per op note Precaution Booklet Issued: Yes (comment) Recall of Precautions/Restrictions: Intact Precaution/Restrictions Comments: painful R shoulder, present prior to admission but pt states worse now Required Braces or Orthoses: Sling Restrictions Weight Bearing Restrictions Per Provider  Order: Yes RUE Weight Bearing Per Provider Order: Weight bearing as tolerated RLE Weight Bearing Per Provider Order: Weight bearing as tolerated LLE Weight Bearing Per Provider Order: Weight bearing as tolerated Other Position/Activity Restrictions: Per Dr. Dozier We will allow weightbearing to tolerance given his lower extremity paraplegia and need for his upper extremities     Mobility  Bed Mobility Overal bed mobility: Needs Assistance Bed Mobility: Supine to Sit, Sit to Supine     Supine to sit: Mod assist, Used rails Sit to supine: Mod assist   General bed mobility comments: pt able to mobilze LEs to EOB, assist to bring off EOB, assist for trunk to midline    Transfers Overall transfer level: Needs assistance Equipment used: Ambulation equipment used Transfers: Sit to/from Stand Sit to Stand: Max assist, Via lift equipment, Min assist           General transfer comment: pt standing from elevated EOB with max A, standing from stedy pads x2 with min A, able to maintain ~30 seconds at longest    Ambulation/Gait                   Stairs             Wheelchair Mobility     Tilt Bed    Modified Rankin (Stroke Patients Only)       Balance Overall balance assessment: History of Falls, Needs assistance Sitting-balance support: Feet supported Sitting balance-Leahy Scale: Poor Sitting balance - Comments: fair to poor with fatigue, requiring min-mod truncal support posteriorly after 15 minutes EOB sitting   Standing balance support: Single extremity supported, During functional activity, Reliant on assistive device  for balance Standing balance-Leahy Scale: Poor Standing balance comment: max A to stand in stedy frame, close CGA to maintain                            Communication Communication Communication: No apparent difficulties  Cognition Arousal: Alert Behavior During Therapy: WFL for tasks assessed/performed, Anxious   PT -  Cognitive impairments: No apparent impairments                         Following commands: Intact      Cueing Cueing Techniques: Verbal cues  Exercises      General Comments General comments (skin integrity, edema, etc.): VSS on supplemental O2      Pertinent Vitals/Pain Pain Assessment Pain Assessment: Faces Faces Pain Scale: Hurts a little bit Pain Location: R shoulder, back Pain Descriptors / Indicators: Constant, Discomfort, Grimacing, Guarding Pain Intervention(s): Monitored during session, Limited activity within patient's tolerance, Premedicated before session    Home Living                          Prior Function            PT Goals (current goals can now be found in the care plan section) Acute Rehab PT Goals Patient Stated Goal: progress mobility PT Goal Formulation: With patient Time For Goal Achievement: 06/05/24 Progress towards PT goals: Progressing toward goals    Frequency    Min 3X/week      PT Plan      Co-evaluation              AM-PAC PT 6 Clicks Mobility   Outcome Measure  Help needed turning from your back to your side while in a flat bed without using bedrails?: A Lot Help needed moving from lying on your back to sitting on the side of a flat bed without using bedrails?: A Lot Help needed moving to and from a bed to a chair (including a wheelchair)?: Total Help needed standing up from a chair using your arms (e.g., wheelchair or bedside chair)?: Total Help needed to walk in hospital room?: Total Help needed climbing 3-5 steps with a railing? : Total 6 Click Score: 8    End of Session Equipment Utilized During Treatment: Other (comment);Oxygen (RUE sling) Activity Tolerance: Patient tolerated treatment well Patient left: in bed;with call bell/phone within reach;with family/visitor present Nurse Communication: Mobility status PT Visit Diagnosis: Unsteadiness on feet (R26.81);Other abnormalities of gait  and mobility (R26.89);Difficulty in walking, not elsewhere classified (R26.2);Pain Pain - Right/Left: Right Pain - part of body: Shoulder;Arm     Time: 8587-8554 PT Time Calculation (min) (ACUTE ONLY): 33 min  Charges:    $Therapeutic Activity: 23-37 mins PT General Charges $$ ACUTE PT VISIT: 1 Visit                     Kreig Parson R. PTA Acute Rehabilitation Services Office: [phone]   Therisa CHRISTELLA Boor 05/24/2024, 4:35 PM

## 2024-05-24 NOTE — Progress Notes (Signed)
 Daily Progress Note   Patient Name: LIBERO PUTHOFF       Date: 05/24/2024 DOB: 1981/01/04  Age: 43 y.o. MRN#: [id number] Attending Physician: Sherrill Alejandro Donovan, DO Primary Care Physician: Patient, No Pcp Per Admit Date: 05/21/2024  Reason for Consultation/Follow-up: Pain control  Subjective: Medical records reviewed including progress notes, labs and imaging, MAR. He has received 4 doses of PRN Oxycodone  and 2 doses of PRN IV Fentanyl  in the past 24 hours. Patient assessed at the bedside. He is sleepy but easily aroused. Reports pain is a little better and now rated 5/10. He feels IV fentanyl  is working when he needs it. No adverse effects. We discussed process of titrating after at least 3 days if necessary and next steps of checking EKG again around then as well. Patient verbalized his understanding.  Questions and concerns addressed. PMT will continue to support holistically.   Length of Stay: 3   Physical Exam Vitals and nursing note reviewed.  Constitutional:      General: He is not in acute distress.    Appearance: He is ill-appearing.  HENT:     Head: Normocephalic and atraumatic.  Cardiovascular:     Rate and Rhythm: Tachycardia present.  Pulmonary:     Effort: Pulmonary effort is normal.  Skin:    General: Skin is warm and dry.  Neurological:     Mental Status: He is alert and oriented to person, place, and time.  Psychiatric:        Behavior: Behavior normal.            Vital Signs: BP 126/73 (BP Location: Right Wrist)   Pulse (!) 109   Temp 98.4 F (36.9 C)   Resp 20   Ht 5' 9 (1.753 m)   Wt (!) 142.9 kg   SpO2 96%   BMI 46.52 kg/m  SpO2: SpO2: 96 % O2 Device: O2 Device: Nasal Cannula O2 Flow Rate: O2 Flow Rate (L/min): 1 L/min      Palliative Care Assessment & Plan   Patient Profile: 43 y.o.  male  with past medical history of childhood asthma, polysubstance abuse, and recently diagnosed metastatic papillary thyroid  cancer admitted from CIR on 05/21/2024 with pathologic right proximal humerus fracture.    Patient initially presented to the hospital on 04/27/2024 with back pain and difficulty ambulating. He was found to have pulmonary embolism, pathologic T2 fracture, and large thyroid  nodule. Papillary carcinoma was diagnosed with thyroid  biopsy from 05/04/2024.  He underwent T1-T3 laminectomy and T2 corpectomy for resection of metastatic tumor on 05/05/2024. Patient was seen by PMT for pain management during  this admission. He was discharged to inpatient rehabilitation on 05/16/2024.    PMT has been consulted to assist with pain management s/p ORIF of proximal humerus fracture and excessive sedation requiring narcan .  Assessment: Acute right shoulder pain S/p ORIF of right humerus due to pathological fracture Recently diagnosed metastatic thyroid  cancer   Recommendations/Plan: Continue Methadone  5 mg Q8H Continue Oxycodone  20 mg IR Q4H for breakthrough pain  Continue Fentanyl  IV 25 mcg Q3H for breakthrough pain uncontrolled with PRN Oxycodone   PMT will continue to follow and support   Prognosis:  Unable to determine  Discharge Planning: To Be Determined  Care plan was discussed with patient   Ishmael Berkovich [name], PA-C  Palliative Medicine Team Team phone # [phone]  Thank you for allowing the Palliative Medicine Team to assist in the care of this patient. Please utilize secure chat with additional questions, if there is no response within 30 minutes please call the above phone number.  Palliative Medicine Team providers are available by phone from 7am to 7pm daily and can be reached through the team cell phone.  Should this patient require assistance outside of these hours, please call the patient's attending physician.

## 2024-05-24 NOTE — Consult Note (Signed)
 Reason for Consult:Metastatic Papillary thyroid  carcinoma Referring Physician: Pauletta Chihuahua MD  Joseph [name] is an 43 y.o. male.  HPI: 43 y.o. male with a history of asthma, polysubstance abuse, metastatic papillary thyroid  cancer. Patient presented from acute inpatient rehabilitation secondary to right shoulder pain and found to have evidence of a pathologic right proximal humerus fracture. Orthopedic surgery consulted for surgical repair and underwent insertion of the intramedullary rod of his humerus with general biopsy of the right humeral lesion as well on 11/29. Pathology demonstrating thyroid  carcinoma.   Relevant recent history -Also with pathological thoracic fracture with pathology positive for PTC - 05/05/24.   US  Thyroid  - Nodule # 1: Large irregular isoechoic predominantly solid nodule with areas of internal cystic change occupies the majority of the left gland. The mass measures 8.2 x 4.5 x 4.9 cm. There are a few dystrophic calcifications. Overall, this constitutes TI-RADS category 4. **Given size (>/= 1.5 cm) and appearance, fine needle aspiration of this moderately suspicious nodule should be considered based on TI-RADS criteria.   IMPRESSION: Solitary large TI-RADS category 4 nodule occupies the majority of the left mid gland. This lesion meets criteria to consider fine-needle aspiration biopsy and biopsy is recommended.  Thyroid  US  FNAb 11/12/5 - papillary carcinoma [facility]   Past Medical History:  Diagnosis Date   Anxiety    Asthma    as a child   Depression    Narcotic abuse (HCC)    Pulmonary nodules 04/29/2024    Past Surgical History:  Procedure Laterality Date   CLOSED REDUCTION FINGER WITH PERCUTANEOUS PINNING Right 06/22/2013   Procedure: RIGHT HAND SMALL FINGER CLOSED MANIPULATION AND PINNING;  Surgeon: Prentice LELON Pagan, MD;  Location: [facility] OR;  Service: Orthopedics;  Laterality: Right;   HUMERUS IM NAIL Right 05/21/2024   Procedure: INSERTION,  INTRAMEDULLARY ROD, HUMERUS;  Surgeon: Dozier Soulier, MD;  Location: [facility] OR;  Service: Orthopedics;  Laterality: Right;   I & D EXTREMITY  03/24/2012   Procedure: IRRIGATION AND DEBRIDEMENT EXTREMITY;  Surgeon: Oneil JAYSON Herald, MD;  Location: [facility] OR;  Service: Orthopedics;  Laterality: Right;  Right Forearm   POSTERIOR CERVICAL FUSION/FORAMINOTOMY N/A 05/05/2024   Procedure: POSTERIOR CERVICAL FUSION CERVICAL SEVEN-THORACIC ONE, THORACIC ONE-THORACIC TWO, THORACIC TWO-THORACIC THREE ,FOR RESECTION OF TUMOR THORACIC TWO CORPECTOMY;  Surgeon: Darnella Dorn SAUNDERS, MD;  Location: [facility] OR;  Service: Neurosurgery;  Laterality: N/A;    Family History  Problem Relation Age of Onset   Other Other    Alcohol abuse Other     Social History:  reports that he has been smoking cigarettes. He has a 10 pack-year smoking history. He has never used smokeless tobacco. He reports that he does not currently use alcohol. He reports current drug use. Drug: Marijuana.  Allergies: No Known Allergies  Medications: I have reviewed the patient's current medications.  Results for orders placed or performed during the hospital encounter of 05/21/24 (from the past 48 hours)  Glucose, capillary     Status: Abnormal   Collection Time: 05/22/24 10:05 PM  Result Value Ref Range   Glucose-Capillary 108 (H) 70 - 99 mg/dL    Comment: Glucose reference range applies only to samples taken after fasting for at least 8 hours.  CBC     Status: Abnormal   Collection Time: 05/22/24 10:53 PM  Result Value Ref Range   WBC 9.8 4.0 - 10.5 K/uL   RBC 3.59 (L) 4.22 - 5.81 MIL/uL   Hemoglobin 10.5 (L) 13.0 - 17.0  g/dL   HCT 66.1 (L) 60.9 - 47.9 %   MCV 94.2 80.0 - 100.0 fL   MCH 29.2 26.0 - 34.0 pg   MCHC 31.1 30.0 - 36.0 g/dL   RDW 86.5 88.4 - 84.4 %   Platelets PLATELET CLUMPS NOTED ON SMEAR, UNABLE TO ESTIMATE 150 - 400 K/uL   nRBC 0.0 0.0 - 0.2 %    Comment: Performed at [facility], [address], [place], [place] [address]   Basic metabolic panel     Status: Abnormal   Collection Time: 05/22/24 10:53 PM  Result Value Ref Range   Sodium 135 135 - 145 mmol/L   Potassium 4.7 3.5 - 5.1 mmol/L   Chloride 99 98 - 111 mmol/L   CO2 29 22 - 32 mmol/L   Glucose, Bld 125 (H) 70 - 99 mg/dL    Comment: Glucose reference range applies only to samples taken after fasting for at least 8 hours.   BUN 13 6 - 20 mg/dL   Creatinine, Ser 9.16 0.61 - 1.24 mg/dL   Calcium 8.6 (L) 8.9 - 10.3 mg/dL   GFR, Estimated >39 >39 mL/min    Comment: (NOTE) Calculated using the CKD-EPI Creatinine Equation (2021)    Anion gap 7 5 - 15    Comment: Performed at [facility], [address] [place], [place] [address]  Comprehensive metabolic panel with GFR     Status: Abnormal   Collection Time: 05/23/24  5:52 AM  Result Value Ref Range   Sodium 137 135 - 145 mmol/L   Potassium 4.3 3.5 - 5.1 mmol/L   Chloride 100 98 - 111 mmol/L   CO2 30 22 - 32 mmol/L   Glucose, Bld 111 (H) 70 - 99 mg/dL    Comment: Glucose reference range applies only to samples taken after fasting for at least 8 hours.   BUN 12 6 - 20 mg/dL   Creatinine, Ser 9.28 0.61 - 1.24 mg/dL   Calcium 8.5 (L) 8.9 - 10.3 mg/dL   Total Protein 6.5 6.5 - 8.1 g/dL   Albumin  2.3 (L) 3.5 - 5.0 g/dL   AST 18 15 - 41 U/L   ALT 37 0 - 44 U/L   Alkaline Phosphatase 102 38 - 126 U/L   Total Bilirubin 0.6 0.0 - 1.2 mg/dL   GFR, Estimated >39 >39 mL/min    Comment: (NOTE) Calculated using the CKD-EPI Creatinine Equation (2021)    Anion gap 7 5 - 15    Comment: Performed at [facility], [address], [place], [place] [address]  CBC with Differential/Platelet     Status: Abnormal   Collection Time: 05/23/24  5:52 AM  Result Value Ref Range   WBC 9.2 4.0 - 10.5 K/uL   RBC 3.27 (L) 4.22 - 5.81 MIL/uL   Hemoglobin 9.5 (L) 13.0 - 17.0 g/dL   HCT 69.3 (L) 60.9 - 47.9 %   MCV 93.6 80.0 - 100.0 fL   MCH 29.1 26.0 - 34.0 pg   MCHC 31.0 30.0 - 36.0 g/dL   RDW 86.4 88.4  - 84.4 %   Platelets PLATELET CLUMPS NOTED ON SMEAR, UNABLE TO ESTIMATE 150 - 400 K/uL   nRBC 0.0 0.0 - 0.2 %   Neutrophils Relative % 65 %   Neutro Abs 6.0 1.7 - 7.7 K/uL   Lymphocytes Relative 18 %   Lymphs Abs 1.7 0.7 - 4.0 K/uL   Monocytes Relative 11 %   Monocytes  Absolute 1.0 0.1 - 1.0 K/uL   Eosinophils Relative 4 %   Eosinophils Absolute 0.4 0.0 - 0.5 K/uL   Basophils Relative 0 %   Basophils Absolute 0.0 0.0 - 0.1 K/uL   WBC Morphology MORPHOLOGY UNREMARKABLE    RBC Morphology MORPHOLOGY UNREMARKABLE    Smear Review See Note     Comment: PLATELET CLUMPS NOTED ON SMEAR, UNABLE TO ESTIMATE   Immature Granulocytes 2 %   Abs Immature Granulocytes 0.17 (H) 0.00 - 0.07 K/uL    Comment: Performed at [facility], [address] [place], [place] [address]  Magnesium      Status: None   Collection Time: 05/23/24  5:52 AM  Result Value Ref Range   Magnesium  1.8 1.7 - 2.4 mg/dL    Comment: Performed at [facility], [address] [place], [place] [address]  Phosphorus     Status: Abnormal   Collection Time: 05/23/24  5:52 AM  Result Value Ref Range   Phosphorus 4.9 (H) 2.5 - 4.6 mg/dL    Comment: Performed at [facility], [address] [place], [place] [address]  CBC with Differential/Platelet     Status: Abnormal   Collection Time: 05/24/24  4:35 AM  Result Value Ref Range   WBC 7.7 4.0 - 10.5 K/uL   RBC 3.15 (L) 4.22 - 5.81 MIL/uL   Hemoglobin 9.1 (L) 13.0 - 17.0 g/dL   HCT 71.0 (L) 60.9 - 47.9 %   MCV 91.7 80.0 - 100.0 fL   MCH 28.9 26.0 - 34.0 pg   MCHC 31.5 30.0 - 36.0 g/dL   RDW 86.7 88.4 - 84.4 %   Platelets PLATELET CLUMPS NOTED ON SMEAR, UNABLE TO ESTIMATE 150 - 400 K/uL    Comment: Immature Platelet Fraction may be clinically indicated, consider ordering this additional test [id number]    nRBC 0.0 0.0 - 0.2 %   Neutrophils Relative % 64 %   Neutro Abs 4.9 1.7 - 7.7 K/uL   Lymphocytes Relative 21 %   Lymphs Abs 1.6 0.7 - 4.0 K/uL    Monocytes Relative 9 %   Monocytes Absolute 0.7 0.1 - 1.0 K/uL   Eosinophils Relative 4 %   Eosinophils Absolute 0.3 0.0 - 0.5 K/uL   Basophils Relative 0 %   Basophils Absolute 0.0 0.0 - 0.1 K/uL   Immature Granulocytes 2 %   Abs Immature Granulocytes 0.18 (H) 0.00 - 0.07 K/uL    Comment: Performed at [facility], [address], [place], [place] [address]  Comprehensive metabolic panel with GFR     Status: Abnormal   Collection Time: 05/24/24  4:35 AM  Result Value Ref Range   Sodium 137 135 - 145 mmol/L   Potassium 4.2 3.5 - 5.1 mmol/L   Chloride 102 98 - 111 mmol/L   CO2 27 22 - 32 mmol/L   Glucose, Bld 113 (H) 70 - 99 mg/dL    Comment: Glucose reference range applies only to samples taken after fasting for at least 8 hours.   BUN 12 6 - 20 mg/dL   Creatinine, Ser 9.25 0.61 - 1.24 mg/dL   Calcium 8.4 (L) 8.9 - 10.3 mg/dL   Total Protein 6.4 (L) 6.5 - 8.1 g/dL   Albumin  2.2 (L) 3.5 - 5.0 g/dL   AST 22 15 - 41 U/L   ALT 36 0 - 44 U/L   Alkaline Phosphatase 93 38 - 126 U/L   Total Bilirubin 0.4 0.0 -  1.2 mg/dL   GFR, Estimated >39 >39 mL/min    Comment: (NOTE) Calculated using the CKD-EPI Creatinine Equation (2021)    Anion gap 8 5 - 15    Comment: Performed at [facility], [address] [place], [place] [address]  Phosphorus     Status: None   Collection Time: 05/24/24  4:35 AM  Result Value Ref Range   Phosphorus 4.6 2.5 - 4.6 mg/dL    Comment: Performed at [facility], [address] [place], [place] [address]  Magnesium      Status: None   Collection Time: 05/24/24  4:35 AM  Result Value Ref Range   Magnesium  1.7 1.7 - 2.4 mg/dL    Comment: Performed at [facility], [address] [place], [place] [address]  Vitamin B12     Status: None   Collection Time: 05/24/24  4:35 AM  Result Value Ref Range   Vitamin B-12 414 180 - 914 pg/mL    Comment: (NOTE) This assay is not validated for testing neonatal or myeloproliferative syndrome  specimens for Vitamin B12 levels. Performed at [facility], [address], [place], [place] [address]   Folate     Status: None   Collection Time: 05/24/24  4:35 AM  Result Value Ref Range   Folate 11.8 >5.9 ng/mL    Comment: Performed at [facility] Lab, [address], [place], [place] [address]  Iron and TIBC     Status: Abnormal   Collection Time: 05/24/24  4:35 AM  Result Value Ref Range   Iron 35 (L) 45 - 182 ug/dL   TIBC 732 749 - 549 ug/dL   Saturation Ratios 13 (L) 17.9 - 39.5 %   UIBC 232 ug/dL    Comment: Performed at [facility], [address], [place], [place] [address]  Ferritin     Status: None   Collection Time: 05/24/24  4:35 AM  Result Value Ref Range   Ferritin 61 24 - 336 ng/mL    Comment: Performed at [facility], [address] [place], [place] [address]  Reticulocytes     Status: Abnormal   Collection Time: 05/24/24  4:35 AM  Result Value Ref Range   Retic Ct Pct 4.3 (H) 0.4 - 3.1 %   RBC. 3.18 (L) 4.22 - 5.81 MIL/uL   Retic Count, Absolute 135.8 19.0 - 186.0 K/uL   Immature Retic Fract 20.5 (H) 2.3 - 15.9 %    Comment: Performed at [facility] Lab, [address], [place], [place] [address]    DG CHEST PORT 1 VIEW Result Date: 05/22/2024 CLINICAL DATA:  Respiratory compromise EXAM: PORTABLE CHEST 1 VIEW COMPARISON:  Chest x-ray 05/22/2024 FINDINGS: Lung volumes are low. This likely accentuating central pulmonary vascularity. There is no lung consolidation, pleural effusion or pneumothorax. Cardiomediastinal silhouette is within normal limits. Cervical/thoracic spinal fusion hardware is present. No acute fractures are seen. IMPRESSION: Low lung volumes. No acute cardiopulmonary process. Electronically Signed   By: Greig Pique M.D.   On: 05/22/2024 22:52    ROS Blood pressure 134/84, pulse (!) 102, temperature 98.6 F (37 C), temperature source Oral, resp. rate 16, height 5' 9 (1.753 m), weight (!) 142.9 kg, SpO2  95%. Physical Exam Constitutional:      Appearance: Normal appearance. He is obese.  HENT:     Head: Normocephalic and atraumatic.     Right Ear: External ear normal.  Left Ear: External ear normal.     Mouth/Throat:     Mouth: Mucous membranes are moist.     Pharynx: Oropharynx is clear.  Eyes:     Extraocular Movements: Extraocular movements intact.     Conjunctiva/sclera: Conjunctivae normal.     Pupils: Pupils are equal, round, and reactive to light.  Neck:     Thyroid : Thyroid  mass and thyromegaly present.  Pulmonary:     Effort: Pulmonary effort is normal. No respiratory distress.  Musculoskeletal:     Cervical back: Normal range of motion.  Neurological:     Mental Status: He is alert.    Reviewed CT Chest - enlarged left thyroid . Limited view.   Assessment/Plan: 53M with metastatic papillary thyroid  carcinoma. Positive path in thyroid  nodule, thoracic spine and humerus. Concern for pulmonary mets.   - No respiratory distresss or difficulty swallowing - Recommend CT neck with contrast before discharge - ordered - Will discuss at multidisciplinary tumor board. Pt added to list.  - Follow up outpatient with me at [facility].  Penne Croak 05/24/2024, 6:07 PM  Available on Haiku chat and [employer]  Department of Otolaryngology Contact Info: Otolaryngology Front Desk Phone including questions about appointments or questions: [phone] - If after normal business hours (Monday-Friday after 5PM or Weekends/Holidays), please call same number and follow prompts for Patient Access Line. There is a physician on call for urgent matters. For life threatening emergencies, please call 911

## 2024-05-24 NOTE — Progress Notes (Addendum)
 PROGRESS NOTE    Joseph [name]  FMW:[id number] DOB: 1980/10/12 DOA: 05/21/2024 PCP: Patient, No Pcp Per   Brief Narrative:  Joseph [name] is a 43 y.o. male with a history of asthma, polysubstance abuse, metastatic papillary thyroid  cancer.  Patient presented from acute inpatient rehabilitation secondary to right shoulder pain and found to have evidence of a pathologic right proximal humerus fracture. Orthopedic surgery consulted for surgical repair and underwent insertion of the intramedullary rod of his humerus with general biopsy of the right humeral lesion as well on 11/29. Subsequently Post-operatively he has been spiking Fevers and currently being worked up for this.  Overnight 11/30-12/1 had a rapid response given his somnolence and drowsiness requiring Narcan .  Palliative care has been consulted for further pain control and they have adjusted his medications.  I spoke with medical oncology and they have recommended ENT consultation while inpatient as well as radiation oncology evaluation.  Radiation oncology wants to hold off on any radiation therapy at this time and wait till outpatient setting next week to allow for additional healing.  Case was discussed with neurosurgery Dr. Darnella who recommends outpatient follow-up for his posterior T1 and T3 instrumentation Tatian fusion and T2 corpectomy for resection of the tumor as well as fusion.  Patient is to be presented at the brain and spine conference on Monday, 05/30/2024.  Awaiting ENT evaluation and further pain control prior to discharging back to CIR  Assessment and Plan:  Pathologic right proximal humerus fracture: Patient with previously known lucency of right proximal humerus consistent with metastasis. X-ray just prior to this admission significant for a lytic lesion with pathologic fracture of the right proximal humerus with mild displacement. Orthopedic surgery consulted and successfully placed an intramedullary rod on  11/29. -Orthopedic surgery recommendations: They are currently recommending up with therapy and working with OT and hand and wrist elbow only; weightbearing as tolerated on the right upper extremity and they are also recommending resuming anticoagulation with an aspirin after further discussion.  They feel that the patient needs to remain in his sling until his 2-week office recheck and updated x-rays -PT/OT recommending CIR and continues to work with them and pain control is being addressed.  The CIR team will continue to follow for medical progression and plan before revisiting CIR discussions.  Metastatic Papillary Thyroid  Cancer: Pulmonary nodules and bone lesions concerning for metastatic disease. Recently diagnosed on recent admission. Patient set up to follow-up with Medical Oncology, Dr. Tina. ENT and Endocrinology referral placed on prior admission.  Dr. Tina aware of the admission and recommending obtaining ENT consultation while inpatient.  I have consulted Dr. Penne Croak of ENT and he will see the patient.  Dr. Tina also consulted radiation oncology and they are recommending holding off on radiation therapy at this time and recommended for additional healing prior to initiation   History of Thoracic Spine Fracture: Pathological in etiology. Patient underwent posterior T1 and T3 instrumentation fusion and T2 corpectomy for resection of the tumor as well as fusion on prior admission on 05/05/2024. Continue pain management as below and appreciate Palliative Care assistance.  Dr. Darnella of neurosurgery recommends a 3-week clinic follow-up for postoperative check and wound check.  He feels that is okay to initiate radiation therapy if needed but the radiation oncology team is going to hold off for now and plan to follow him up in the outpatient setting next week.  Currently the patient is will be discussed at the brain and spine conference  on Monday, 05/30/2024.  Fever: Improved. Spiked Temp of  101.6 after surgical intervention and was tachycardic. T Max in the last 48 hours hours was 101 and in last 24 hours he has been afebrile with a Tmax of 99.2. Currently has no Leukocytosis still. IVF with NS @ 75 mL/hr is to stop the 12 more hours. Check Blood Cx x2 and showing NGTD @ 3 days. Checked U/A and Negative. CXR showed Low lung volumes and No acute cardiopulmonary process. Recently has been diagnosed with PE. CTM off of Abx currently and if worsens consider placing on Empiric Abx. Flutter Valve and Incentive Spirometry for ATX.  Patient has Albuterol  nebs 2.5 mg every 6 as needed for wheezing or shortness of breath.   Hypoxia: Likely placed on O2 for Comfort. Will need to Wean and continue Flutter Valve, Incentive Spirometry, and Guaifenesin  1200 mg po BID   History of Pulmonary Embolism: Recently diagnosed on 11/5. Provoked secondary to metastatic cancer. Patient is managed on Apixaban , which was held for surgery (last dose was 11/28 at 20:15. Resumed Apixaban  now;  Albuterol  as above   Paraplegia and Neurogenic bladder: Patient was admitted from acute inpatient rehabilitation. C/w Supportive Care. Continue PT/OT and they are recommending CIR when stable for D/C  Hyponatremia: Na+ Trend improved and stable at 137: Discontinue NS @ 75 mL/hr hour after another 12 hours. CTM and Trend and repeat CMP in the AM    Constipation: Continue MiraLAX  17 grams po BID and Senna-Docusate 2 tab po BID; Also has Fleet Enema 1 enema RC onceprn Severe Constipation; D/C Scheduled Docusate 100 mg po BID   Chronic Pain: Patient's pain medication regimen was managed while in acute inpatient rehab.Given somnolence and need for Narcan  the night of 11/30-12/1 will change Regimen. Discontinued Oxycodone  12 hr 100 mg q8 hours but will continue Oxycodone  IR  20 mg q4 hours as needed. Also has Methocarbamol  750 mg po AC/HS and Cyclobenzaprine  10 mg po TID; also has Pregabalin  50 mg p.o. 3 times daily; Palliative Care  consulted and added 25 mcg IV q3hprn Severe Pain and added Methadone  5 mg po q8h; Also add Lidocaine  1 patch TD.  Since starting has adjusted his pain regimen and is doing better.   History of Substance Abuse: Noted. Patient counseled on admission.  Normocytic Anemia: Chronic. Hgb/Hct Trend has been relatively stable but did drop a little bit and is now showing a value of 9.1/28.9. Checked Anemia Panel and it showed an iron  level of 35, UIBC of 232, TIBC of 267, saturation ratios of 13%, ferritin level 61, folate level 11.8, vitamin B12 level 414.  Will initiate iron  supplementation with Niferex 150 mg p.o. daily. CTM for S/Sx of Bleeding; No overt bleeding noted. Repeat CBC in the AM  Thrombocytopenia: Mild. Plt Count Trend went from 122 -> 106 but on the last few checks the platelet clumps have been noted on the smear and they have unable to estimate the platelet count. CTM and Trend and Monitor closely now that [facility] is being resumed. Repeat CBC in the AM  Hypoalbuminemia: Patient's Albumin  Trend ranging from 2.2-3.1. Last check was 2.2 this morning. CTM & Trend & repeat CMP in the AM  Class III (Morbid) Obesity: Complicates overall prognosis and care. Estimated body mass index is 46.52 kg/m as calculated from the following:   Height as of this encounter: 5' 9 (1.753 m).   Weight as of this encounter: 142.9 kg. Weight Loss and Dietary Counseling given   DVT  prophylaxis: SCDs Start: 05/21/24 1410 SCDs Start: 05/21/24 0130 apixaban  (ELIQUIS ) tablet 5 mg    Code Status: Full Code Family Communication: No family currently at bedside  Disposition Plan:  Level of care: Med-Surg Status is: Inpatient Remains inpatient appropriate because: Needs further clinical improvement and clearance by the specialist and further pain control prior to going to CIR.   Consultants:  Radiation oncology ENT Case discussed with medical oncology Case was discussed with neurosurgery CIR Palliative care  medicine  Procedures:  As delineated as above  Antimicrobials:  Anti-infectives (From admission, onward)    Start     Dose/Rate Route Frequency Ordered Stop   05/21/24 0945  ceFAZolin  (ANCEF ) IVPB 3g/150 mL premix        3 g 300 mL/hr over 30 Minutes Intravenous  Once 05/21/24 0944 05/21/24 1042       Subjective: Seen and examined at bedside and he thinks his pain is doing little bit better today.  No nausea or vomiting.  Resting.  Has not worked with therapy yet today.  No other concerns or complaints at this time.  Objective: Vitals:   05/24/24 0007 05/24/24 0416 05/24/24 0753 05/24/24 1636  BP: 120/67 135/69 126/73 134/84  Pulse: (!) 109 99 (!) 109 (!) 102  Resp: 17 18 20 16   Temp: 99.2 F (37.3 C) 98.6 F (37 C) 98.4 F (36.9 C) 98.6 F (37 C)  TempSrc:  Oral  Oral  SpO2: 97% 96% 96% 95%  Weight:      Height:        Intake/Output Summary (Last 24 hours) at 05/24/2024 1720 Last data filed at 05/24/2024 1300 Gross per 24 hour  Intake 600.22 ml  Output 1900 ml  Net -1299.78 ml   Filed Weights   05/21/24 0844  Weight: (!) 142.9 kg   Examination: Physical Exam:  Constitutional: WN/WD morbidly obese African-American male in no acute distress Respiratory: Diminished to auscultation bilaterally with some coarse breath sounds, no wheezing, rales, rhonchi or crackles. Normal respiratory effort and patient is not tachypenic. No accessory muscle use.  Wearing supplemental oxygen via nasal cannula Cardiovascular: RRR, no murmurs / rubs / gallops. S1 and S2 auscultated. No extremity edema. Abdomen: Soft, non-tender, distended secondary to body habitus. Bowel sounds positive.  GU: Deferred. Musculoskeletal: No clubbing / cyanosis of digits/nails. No joint deformity upper and lower extremities.  Right arm is not in the sling currently and his incisions appear clean dry and intact Skin: No rashes, lesions, ulcers on a limited skin evaluation. No induration; Warm and dry.   Neurologic: CN 2-12 grossly intact with no focal deficits. Romberg sign and cerebellar reflexes not assessed.  Psychiatric: Normal judgment and insight. Alert and oriented x 3. Normal mood and appropriate affect.   Data Reviewed: I have personally reviewed following labs and imaging studies  CBC: Recent Labs  Lab 05/21/24 0243 05/22/24 0201 05/22/24 2253 05/23/24 0552 05/24/24 0435  WBC 7.8 9.0 9.8 9.2 7.7  NEUTROABS  --   --   --  6.0 4.9  HGB 9.6* 9.3* 10.5* 9.5* 9.1*  HCT 30.6* 29.1* 33.8* 30.6* 28.9*  MCV 94.2 91.8 94.2 93.6 91.7  PLT 106* PLATELET CLUMPS NOTED ON SMEAR, UNABLE TO ESTIMATE PLATELET CLUMPS NOTED ON SMEAR, UNABLE TO ESTIMATE PLATELET CLUMPS NOTED ON SMEAR, UNABLE TO ESTIMATE PLATELET CLUMPS NOTED ON SMEAR, UNABLE TO ESTIMATE   Basic Metabolic Panel: Recent Labs  Lab 05/21/24 0243 05/22/24 0022 05/22/24 0201 05/22/24 2253 05/23/24 0552 05/24/24 0435  NA 134*  132*  --  135 137 137  K 4.2 4.3  --  4.7 4.3 4.2  CL 100 97*  --  99 100 102  CO2 27 26  --  29 30 27   GLUCOSE 129* 163*  --  125* 111* 113*  BUN 13 12  --  13 12 12   CREATININE 0.76 0.76  --  0.83 0.71 0.74  CALCIUM 8.5* 8.1*  --  8.6* 8.5* 8.4*  MG 1.7  --  1.7  --  1.8 1.7  PHOS  --   --  5.2*  --  4.9* 4.6   GFR: Estimated Creatinine Clearance: 167.7 mL/min (by C-G formula based on SCr of 0.74 mg/dL). Liver Function Tests: Recent Labs  Lab 05/22/24 0201 05/23/24 0552 05/24/24 0435  AST 23 18 22   ALT 37 37 36  ALKPHOS 101 102 93  BILITOT 0.3 0.6 0.4  PROT 6.4* 6.5 6.4*  ALBUMIN  2.3* 2.3* 2.2*   No results for input(s): LIPASE, AMYLASE in the last 168 hours. No results for input(s): AMMONIA in the last 168 hours. Coagulation Profile: No results for input(s): INR, PROTIME in the last 168 hours. Cardiac Enzymes: No results for input(s): CKTOTAL, CKMB, CKMBINDEX, TROPONINI in the last 168 hours. BNP (last 3 results) No results for input(s): PROBNP in the last  8760 hours. HbA1C: No results for input(s): HGBA1C in the last 72 hours. CBG: Recent Labs  Lab 05/22/24 2205  GLUCAP 108*   Lipid Profile: No results for input(s): CHOL, HDL, LDLCALC, TRIG, CHOLHDL, LDLDIRECT in the last 72 hours. Thyroid  Function Tests: No results for input(s): TSH, T4TOTAL, FREET4, T3FREE, THYROIDAB in the last 72 hours. Anemia Panel: Recent Labs    05/24/24 0435  VITAMINB12 414  FOLATE 11.8  FERRITIN 61  TIBC 267  IRON  35*  RETICCTPCT 4.3*   Sepsis Labs: No results for input(s): PROCALCITON, LATICACIDVEN in the last 168 hours.  Recent Results (from the past 240 hours)  Culture, blood (Routine X 2) w Reflex to ID Panel     Status: None (Preliminary result)   Collection Time: 05/21/24 10:58 PM   Specimen: BLOOD  Result Value Ref Range Status   Specimen Description BLOOD BLOOD LEFT HAND  Final   Special Requests   Final    BOTTLES DRAWN AEROBIC AND ANAEROBIC Blood Culture results may not be optimal due to an inadequate volume of blood received in culture bottles   Culture   Final    NO GROWTH 3 DAYS Performed at [facility], [address], [place], [place] [address]    Report Status PENDING  Incomplete  Culture, blood (Routine X 2) w Reflex to ID Panel     Status: None (Preliminary result)   Collection Time: 05/21/24 10:58 PM   Specimen: BLOOD  Result Value Ref Range Status   Specimen Description BLOOD BLOOD LEFT ARM  Final   Special Requests   Final    BOTTLES DRAWN AEROBIC ONLY Blood Culture results may not be optimal due to an inadequate volume of blood received in culture bottles   Culture   Final    NO GROWTH 3 DAYS Performed at [facility], [address], [place], [place] [address]    Report Status PENDING  Incomplete    Radiology Studies: DG CHEST PORT 1 VIEW Result Date: 05/22/2024 CLINICAL DATA:  Respiratory compromise EXAM: PORTABLE CHEST 1 VIEW COMPARISON:  Chest x-ray 05/22/2024 FINDINGS:  Lung volumes are low. This likely accentuating central pulmonary  vascularity. There is no lung consolidation, pleural effusion or pneumothorax. Cardiomediastinal silhouette is within normal limits. Cervical/thoracic spinal fusion hardware is present. No acute fractures are seen. IMPRESSION: Low lung volumes. No acute cardiopulmonary process. Electronically Signed   By: Greig Pique M.D.   On: 05/22/2024 22:52   Scheduled Meds:  apixaban   5 mg Oral BID   Chlorhexidine  Gluconate Cloth  6 each Topical Daily   cyclobenzaprine   10 mg Oral TID   guaiFENesin  1,200 mg Oral BID   iron polysaccharides  150 mg Oral Daily   lidocaine   1 patch Transdermal Daily   methadone   5 mg Oral Q8H   methocarbamol   750 mg Oral TID AC & HS   multivitamin with minerals  1 tablet Oral Daily   mupirocin  ointment  1 Application Nasal BID   polyethylene glycol  17 g Oral BID   pregabalin   50 mg Oral TID   Ensure Max Protein  11 oz Oral BID   senna-docusate  2 tablet Oral BID   Continuous Infusions:  sodium chloride       LOS: 3 days   Alejandro Marker, DO [facility] Available via Epic secure chat 7am-7pm After these hours, please refer to coverage provider listed on [email or web address] 05/24/2024, 5:20 PM

## 2024-05-25 ENCOUNTER — Inpatient Hospital Stay (HOSPITAL_COMMUNITY): Payer: MEDICAID

## 2024-05-25 DIAGNOSIS — M84521A Pathological fracture in neoplastic disease, right humerus, initial encounter for fracture: Secondary | ICD-10-CM | POA: Diagnosis not present

## 2024-05-25 LAB — COMPREHENSIVE METABOLIC PANEL WITH GFR
ALT: 36 U/L (ref 0–44)
AST: 19 U/L (ref 15–41)
Albumin: 2.3 g/dL — ABNORMAL LOW (ref 3.5–5.0)
Alkaline Phosphatase: 95 U/L (ref 38–126)
Anion gap: 12 (ref 5–15)
BUN: 9 mg/dL (ref 6–20)
CO2: 27 mmol/L (ref 22–32)
Calcium: 8.7 mg/dL — ABNORMAL LOW (ref 8.9–10.3)
Chloride: 100 mmol/L (ref 98–111)
Creatinine, Ser: 0.67 mg/dL (ref 0.61–1.24)
GFR, Estimated: >60 mL/min (ref >60)
Glucose, Bld: 114 mg/dL — ABNORMAL HIGH (ref 70–99)
Potassium: 4 mmol/L (ref 3.5–5.1)
Sodium: 139 mmol/L (ref 135–145)
Total Bilirubin: 0.4 mg/dL (ref 0.0–1.2)
Total Protein: 6.4 g/dL — ABNORMAL LOW (ref 6.5–8.1)

## 2024-05-25 LAB — CBC WITH DIFFERENTIAL/PLATELET
Abs Immature Granulocytes: 0.11 K/uL — ABNORMAL HIGH (ref 0.00–0.07)
Basophils Absolute: 0 K/uL (ref 0.0–0.1)
Basophils Relative: 0 %
Eosinophils Absolute: 0.3 K/uL (ref 0.0–0.5)
Eosinophils Relative: 4 %
HCT: 29.5 % — ABNORMAL LOW (ref 39.0–52.0)
Hemoglobin: 9.6 g/dL — ABNORMAL LOW (ref 13.0–17.0)
Immature Granulocytes: 1 %
Lymphocytes Relative: 19 %
Lymphs Abs: 1.5 K/uL (ref 0.7–4.0)
MCH: 29.7 pg (ref 26.0–34.0)
MCHC: 32.5 g/dL (ref 30.0–36.0)
MCV: 91.3 fL (ref 80.0–100.0)
Monocytes Absolute: 0.7 K/uL (ref 0.1–1.0)
Monocytes Relative: 9 %
Neutro Abs: 5.4 K/uL (ref 1.7–7.7)
Neutrophils Relative %: 67 %
Platelets: UNDETERMINED K/uL (ref 150–400)
RBC: 3.23 MIL/uL — ABNORMAL LOW (ref 4.22–5.81)
RDW: 13.4 % (ref 11.5–15.5)
WBC: 8 K/uL (ref 4.0–10.5)
nRBC: 0 % (ref 0.0–0.2)

## 2024-05-25 LAB — PHOSPHORUS: Phosphorus: 4.8 mg/dL — ABNORMAL HIGH (ref 2.5–4.6)

## 2024-05-25 LAB — MAGNESIUM: Magnesium: 1.7 mg/dL (ref 1.7–2.4)

## 2024-05-25 MED ORDER — IOHEXOL 350 MG/ML SOLN
75.0000 mL | Freq: Once | INTRAVENOUS | Status: AC | PRN
  Administered 2024-05-25: 75 mL via INTRAVENOUS

## 2024-05-25 MED ORDER — POLYETHYLENE GLYCOL 3350 17 G PO PACK: 17.0000 g | PACK | Freq: Every day | ORAL | Status: DC

## 2024-05-25 MED ORDER — FENTANYL CITRATE (PF) 50 MCG/ML IJ SOSY
25.0000 ug | PREFILLED_SYRINGE | INTRAMUSCULAR | Status: DC | PRN
  Administered 2024-05-25 – 2024-05-27 (×9): 25 ug via INTRAVENOUS
  Filled 2024-05-25 (×9): qty 1

## 2024-05-25 NOTE — Progress Notes (Signed)
 Daily Progress Note   Patient Name: Joseph [name]       Date: 05/25/2024 DOB: [birth year]  Age: 43 y.o. MRN#: [id number] Attending Physician: Briana Elgin LABOR, MD Primary Care Physician: Patient, No Pcp Per Admit Date: 05/21/2024  Reason for Consultation/Follow-up: Pain control  Subjective: Medical records reviewed including progress notes, labs and imaging, MAR. He has received 3 doses of PRN Oxycodone  and 5 doses of PRN IV Fentanyl  in the past 24 hours. Patient assessed at the bedside. He reports 8/10 pain, noticing overall decline in effectiveness of pain regiment compared to yesterday, though he is more mobile. His aunt is present visiting.  Created space and opportunity for patient and family's thoughts and feelings on patient's current illness. Keshon feels that his methadone  is only effective for about 30-60 minutes. Oxycodone  is only helping a little. Fentanyl  works well when given IV, though it is not available as frequently as his breakthrough symptoms are occurring. He understands relationship between increased pain and increased activity. We reviewed concerns and rationale for not titrating further methadone  at this time, with plan for increasing dose tomorrow. Considered increasing dose tonight unfortunately, but he did not start current dose until 2pm on day 1/Monday.   Questions and concerns addressed. PMT will continue to support holistically.  Length of Stay: 4   Physical Exam Vitals and nursing note reviewed.  Constitutional:      General: He is not in acute distress.    Appearance: He is ill-appearing.  HENT:     Head: Normocephalic and atraumatic.  Cardiovascular:     Rate and Rhythm: Normal rate.  Pulmonary:     Effort: Pulmonary effort is normal.  Skin:    General: Skin is warm and dry.  Neurological:      Mental Status: He is alert and oriented to person, place, and time.  Psychiatric:        Behavior: Behavior normal.            Vital Signs: BP 126/77   Pulse (!) 101   Temp 98.4 F (36.9 C)   Resp 16   Ht 5' 9 (1.753 m)   Wt (!) 142.9 kg   SpO2 98%   BMI 46.52 kg/m  SpO2: SpO2: 98 % O2 Device: O2 Device: Room Air O2 Flow Rate: O2 Flow Rate (L/min): 1 L/min      Palliative Care Assessment & Plan   Patient Profile: 43 y.o. male  with past medical history of childhood asthma, polysubstance abuse, and recently diagnosed metastatic papillary thyroid  cancer admitted from CIR on 05/21/2024 with pathologic right proximal humerus fracture.  Patient initially presented to the hospital on 04/27/2024 with back pain and difficulty ambulating. He was found to have pulmonary embolism, pathologic T2 fracture, and large thyroid  nodule. Papillary carcinoma was diagnosed with thyroid  biopsy from 05/04/2024.  He underwent T1-T3 laminectomy and T2 corpectomy for resection of metastatic tumor on 05/05/2024. Patient was seen by PMT for pain management during this admission. He was discharged to inpatient rehabilitation on 05/16/2024.    PMT has been consulted to assist with pain management s/p ORIF of proximal humerus fracture and excessive sedation requiring narcan .  Assessment: Acute right shoulder pain S/p ORIF of right humerus due to pathological fracture Recently diagnosed metastatic thyroid  cancer   Recommendations/Plan: Continue Methadone  5 mg Q8H, will increase to 7.5 mg Q8H tomorrow Continue Oxycodone  20 mg IR Q4H for breakthrough pain  Increased frequency of Fentanyl  IV 25 mcg to Q2H for worsening breakthrough pain, would still try PRN Oxycodone  first if able PMT will continue to follow and support   Prognosis:  Unable to determine  Discharge Planning: To Be Determined  Care plan was discussed with patient, RN, MD   Mickle [name], PA-C  Palliative Medicine Team Team  phone # [phone]  Thank you for allowing the Palliative Medicine Team to assist in the care of this patient. Please utilize secure chat with additional questions, if there is no response within 30 minutes please call the above phone number.  Palliative Medicine Team providers are available by phone from 7am to 7pm daily and can be reached through the team cell phone.  Should this patient require assistance outside of these hours, please call the patient's attending physician.    Billing based on MDM: High  Problems Addressed: One acute or chronic illness or injury that poses a threat to life or bodily function  Amount and/or Complexity of Data: Category 1:Review of prior external note(s) from each unique source, Review of the result(s) of each unique test, and Assessment requiring an independent historian(s), Category 2:Independent interpretation of a test performed by another physician/other qualified health care professional (not separately reported), and Category 3:Discussion of management or test interpretation with external physician/other qualified health care professional/appropriate source (not separately reported)  Risks: Parenteral controlled substances

## 2024-05-25 NOTE — Progress Notes (Signed)
 PROGRESS NOTE    Joseph [name]  FMW:[id number] DOB: 02-11-1981 DOA: 05/21/2024 PCP: Patient, No Pcp Per   Brief Narrative: Joseph [name] is a 43 y.o. male with a history of asthma, polysubstance abuse, metastatic papillary thyroid  cancer. Patient presented from acute inpatient rehabilitation secondary to right shoulder pain and found to have evidence of a pathologic right proximal humerus fracture. Orthopedic surgery consulted for surgical repair. ENT consulted while admitted and recommended outpatient follow-up. Plan for discharge back to acute inpatient rehabilitation.   Assessment and Plan:  Pathologic right proximal humerus fracture Patient with previously known lucency of right proximal humerus consistent with metastasis. X-ray just prior to this admission significant for a lytic lesion with pathologic fracture of the right proximal humerus with mild displacement. Orthopedic surgery consulted and successfully placed an intramedullary rod on 11/29. -Orthopedic surgery recommendations: weight bearint as tolerated, hand, wrist, elbow only  Metastatic papillary thyroid  cancer Pulmonary nodules and bone lesions concerning for metastatic disease. Recently diagnosed on recent admission. Patient set up to follow-up with medical oncology, Dr. Tina. ENT and endocrinology referral placed on prior admission. ENT consulted, recommending outpatient follow-up in addition to a CT neck. -Order CT neck  History of thoracic spine fracture Pathological in etiology. Patient underwent instrumentation and fusion on prior admission. -Continue pain management  History of pulmonary embolism Recently diagnosed on 11/6. Provoked secondary to metastatic cancer. Patient is managed on Eliquis , which was initially held for surgery and is now resumed. -Continue Eliquis   Paraplegia Neurogenic bladder Patient was admitted from acute inpatient rehabilitation. Patient  -Continue PT/OT  Chronic  normocytic anemia Stable.  Constipation -Continue MiraLAX   Chronic pain Patient's pain medication regimen was managed while in acute inpatient rehab. Patient developed overdose requiring narcan . Analgesics adjusted. Palliative care consulted. Patient started on methadone . -Continue methadone , Lyrica , oxycodone  as needed, Fentanyl  IV as needed  History of substance abuse Noted. Patient counseled on admission.  Constipation Patient now having stool incontinence. -Hold MiraLAX  for now; likely restart daily in 1-2 days   DVT prophylaxis: Eliquis  Code Status:   Code Status: Full Code Family Communication: None at bedside Disposition Plan: Discharge likely back to acute inpatient rehabilitation pending PT/OT eval, stable hemoglobin, orthopedic surgery recommendations   Consultants:  Orthopedic surgery Palliative care medicine  Procedures:  Insertion, intramedullary rod, humerus  Antimicrobials: None    Subjective: Concerns about his pain management. Reports having stool incontinence.  Objective: BP 126/77   Pulse (!) 101   Temp 98.4 F (36.9 C)   Resp 16   Ht 5' 9 (1.753 m)   Wt (!) 142.9 kg   SpO2 98%   BMI 46.52 kg/m   Examination:  General exam: Appears calm and comfortable. Respiratory system: Clear to auscultation. Respiratory effort normal. Cardiovascular system: S1 & S2 heard, RRR. Gastrointestinal system: Abdomen is nondistended, soft and nontender. Normal bowel sounds heard. Central nervous system: Somnolent but arouses easily    Data Reviewed: I have personally reviewed following labs and imaging studies  CBC Lab Results  Component Value Date   WBC 8.0 05/25/2024   RBC 3.23 (L) 05/25/2024   HGB 9.6 (L) 05/25/2024   HCT 29.5 (L) 05/25/2024   MCV 91.3 05/25/2024   MCH 29.7 05/25/2024   PLT PLATELET CLUMPS NOTED ON SMEAR, UNABLE TO ESTIMATE 05/25/2024   MCHC 32.5 05/25/2024   RDW 13.4 05/25/2024   LYMPHSABS 1.5 05/25/2024   MONOABS 0.7  05/25/2024   EOSABS 0.3 05/25/2024   BASOSABS 0.0 05/25/2024  Last metabolic panel Lab Results  Component Value Date   NA 139 05/25/2024   K 4.0 05/25/2024   CL 100 05/25/2024   CO2 27 05/25/2024   BUN 9 05/25/2024   CREATININE 0.67 05/25/2024   GLUCOSE 114 (H) 05/25/2024   GFRNONAA >60 05/25/2024   GFRAA >60 08/16/2019   CALCIUM 8.7 (L) 05/25/2024   PHOS 4.8 (H) 05/25/2024   PROT 6.4 (L) 05/25/2024   ALBUMIN  2.3 (L) 05/25/2024   BILITOT 0.4 05/25/2024   ALKPHOS 95 05/25/2024   AST 19 05/25/2024   ALT 36 05/25/2024   ANIONGAP 12 05/25/2024    GFR: Estimated Creatinine Clearance: 167.7 mL/min (by C-G formula based on SCr of 0.67 mg/dL).  Recent Results (from the past 240 hours)  Culture, blood (Routine X 2) w Reflex to ID Panel     Status: None (Preliminary result)   Collection Time: 05/21/24 10:58 PM   Specimen: BLOOD  Result Value Ref Range Status   Specimen Description BLOOD BLOOD LEFT HAND  Final   Special Requests   Final    BOTTLES DRAWN AEROBIC AND ANAEROBIC Blood Culture results may not be optimal due to an inadequate volume of blood received in culture bottles   Culture   Final    NO GROWTH 4 DAYS Performed at [facility], [address], [place], [place] [address]    Report Status PENDING  Incomplete  Culture, blood (Routine X 2) w Reflex to ID Panel     Status: None (Preliminary result)   Collection Time: 05/21/24 10:58 PM   Specimen: BLOOD  Result Value Ref Range Status   Specimen Description BLOOD BLOOD LEFT ARM  Final   Special Requests   Final    BOTTLES DRAWN AEROBIC ONLY Blood Culture results may not be optimal due to an inadequate volume of blood received in culture bottles   Culture   Final    NO GROWTH 4 DAYS Performed at [facility], [address] [place], [place] [address]    Report Status PENDING  Incomplete      Radiology Studies: No results found.     LOS: 4 days    [name], MD [facility] 05/25/2024, 12:38 PM   If 7PM-7AM, please contact night-coverage [email or web address]

## 2024-05-25 NOTE — Progress Notes (Signed)
 Occupational Therapy Treatment Patient Details Name: Joseph [name] MRN: [id number] DOB: Jan 01, 1981 Today's Date: 05/25/2024   History of present illness Pt is a 43 y.o. male presenting 11/28 from CIR with R shoulder pain; found to have R humeral pathologic fx. S/p IM rod placement 11/29. 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 At CIR 11/24-11/28. PMH: cocaine use disorder, cannibis use disorder.   OT comments  Pt progressing toward established OT goals. Challenging activity tolerance and mobility progression with co-tx with PTA to progress to SPT transfer safely. Used R platform RW with pt performing STS x2 this session and standing ~20 second bout then ~45 second bout with pivot to chair. Pt benefits from direct cues for maintenance of upright posture and extension of hips/knees with fatigue to prevent stooped posture. Engaged in UE HEP and RUE positioned on pillows in chair on departure. Will continue to follow acutely. Pt remains an excellent candidate for intensive follow up therapy due to pt motivation and good progression.       If plan is discharge home, recommend the following:  A lot of help with walking and/or transfers;Two people to help with walking and/or transfers;A lot of help with bathing/dressing/bathroom;Two people to help with bathing/dressing/bathroom   Equipment Recommendations  Other (comment) (defer)    Recommendations for Other Services Rehab consult    Precautions / Restrictions Precautions Precautions: Fall;Back;Shoulder Type of Shoulder Precautions: elbow/wrist/hand ROM only, WBAT for transfers per op note Precaution Booklet Issued: Yes (comment) Recall of Precautions/Restrictions: Intact Precaution/Restrictions Comments: painful R shoulder, present prior  to admission but pt states worse now Required Braces or Orthoses: Sling Restrictions Weight Bearing Restrictions Per Provider Order: Yes RUE Weight Bearing Per Provider Order: Weight bearing as tolerated RLE Weight Bearing Per Provider Order: Weight bearing as tolerated LLE Weight Bearing Per Provider Order: Weight bearing as tolerated Other Position/Activity Restrictions: Per Dr. Dozier We will allow weightbearing to tolerance given his lower extremity paraplegia and need for his upper extremities       Mobility Bed Mobility Overal bed mobility: Needs Assistance Bed Mobility: Supine to Sit   Sidelying to sit: Min assist, +2 for physical assistance       General bed mobility comments: pt able to mobilze LEs to EOB, assist to bring off EOB, assist for trunk to midline    Transfers Overall transfer level: Needs assistance Equipment used: Right platform walker Transfers: Sit to/from Stand, Bed to chair/wheelchair/BSC Sit to Stand: Mod assist, +2 physical assistance, From elevated surface     Step pivot transfers: Mod assist, +2 physical assistance     General transfer comment: pt standing from slightly elevated EOB x2 qith mod A +2 to boost to stand. Assist needed to place RUE on platform of RW. fair clearacne with stepping over to chair, mod A +2 mainly to manage RW     Balance Overall balance assessment: History of Falls, Needs assistance Sitting-balance support: Feet supported Sitting balance-Leahy Scale: Poor Sitting balance - Comments: able to maintain with LUE support with close CGA   Standing balance support: Single extremity supported, During functional activity, Reliant on assistive device for balance Standing balance-Leahy Scale: Poor Standing balance comment: reliant on UE support in standign  ADL either performed or assessed with clinical judgement   ADL Overall ADL's : Needs assistance/impaired                      Lower Body Dressing: Total assistance;Bed level Lower Body Dressing Details (indicate cue type and reason): socks Toilet Transfer: Moderate assistance;+2 for physical assistance;+2 for safety/equipment;Stand-pivot (R platform RW)                  Extremity/Trunk Assessment Upper Extremity Assessment Upper Extremity Assessment: RUE deficits/detail RUE Deficits / Details: elbow/wrist/hand ROM WFL. shakiness with supination of hand. orders for elbow/wrist/hand ROM only. handout provided. pt reports he has been doing these exercises frequently throughout the day   Lower Extremity Assessment Lower Extremity Assessment: Defer to PT evaluation        Vision   Vision Assessment?: No apparent visual deficits   Perception     Praxis     Communication Communication Communication: No apparent difficulties   Cognition Arousal: Alert Behavior During Therapy: WFL for tasks assessed/performed, Anxious Cognition: No apparent impairments                               Following commands: Intact        Cueing   Cueing Techniques: Verbal cues  Exercises Exercises: Other exercises Other Exercises Other Exercises: AROM elbow/wrist/hand elbow flexion/extension 10x, composite flexion/extension 10x, wrist flexion/extension 10x, pronation/supination 10x; repositioned on pillows in chair at end of session, with sling loosened around neck to give pt a break    Shoulder Instructions       General Comments VSS on RA    Pertinent Vitals/ Pain       Pain Assessment Pain Assessment: Faces Faces Pain Scale: Hurts a little bit Pain Location: R shoulder, back Pain Descriptors / Indicators: Constant, Discomfort, Grimacing, Guarding Pain Intervention(s): Limited activity within patient's tolerance, Monitored during session  Home Living                                          Prior Functioning/Environment              Frequency  Min 2X/week         Progress Toward Goals  OT Goals(current goals can now be found in the care plan section)  Progress towards OT goals: Progressing toward goals  Acute Rehab OT Goals Patient Stated Goal: get better OT Goal Formulation: With patient Time For Goal Achievement: 06/05/24 Potential to Achieve Goals: Good ADL Goals Pt Will Perform Grooming: with set-up;sitting Pt Will Perform Upper Body Bathing: with min assist;sitting Pt Will Transfer to Toilet: with min assist;with +2 assist;stand pivot transfer Pt/caregiver will Perform Home Exercise Program: Right Upper extremity;With written HEP provided  Plan      Co-evaluation    PT/OT/SLP Co-Evaluation/Treatment: Yes Reason for Co-Treatment: Complexity of the patient's impairments (multi-system involvement);To address functional/ADL transfers;For patient/therapist safety PT goals addressed during session: Mobility/safety with mobility;Balance OT goals addressed during session: ADL's and self-care;Proper use of Adaptive equipment and DME (transfer progression)      AM-PAC OT 6 Clicks Daily Activity     Outcome Measure   Help from another person eating meals?: None           6 Click Score: 4    End of Session Equipment Utilized During  Treatment: Other (comment) (R shoulder sling, R platform RW)  OT Visit Diagnosis: Unsteadiness on feet (R26.81);Other abnormalities of gait and mobility (R26.89);Muscle weakness (generalized) (M62.81);History of falling (Z91.81);Pain Pain - Right/Left: Right Pain - part of body: Shoulder   Activity Tolerance Patient tolerated treatment well   Patient Left in bed;with call bell/phone within reach   Nurse Communication Mobility status        Time: 1030-1055 OT Time Calculation (min): 25 min  Charges: OT General Charges $OT Visit: 1 Visit OT Treatments $Therapeutic Activity: 8-22 mins  Elma JONETTA Lebron FREDERICK, OTR/L [facility] Acute Rehabilitation Office: [phone]   Elma JONETTA Lebron 05/25/2024, 4:34 PM

## 2024-05-25 NOTE — Progress Notes (Signed)
 Physical Therapy Treatment Patient Details Name: Joseph [name] MRN: [id number] DOB: Jun 02, 1981 Today's Date: 05/25/2024   History of Present Illness Pt is a 43 y.o. male presenting 11/28 from CIR with R shoulder pain; found to have R humeral pathologic fx. S/p IM rod placement 11/29. 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 At CIR 11/24-11/28. PMH: cocaine use disorder, cannibis use disorder.    PT Comments  Pt resting in bed on arrival, agreeable to session and demonstrating good progress towards acute goals. Pt seen in conjunction with OT to maximize pt activity tolerance and for progression of functional mobility transfers. Pt performing bed mobility with min A +2, transfers sit<>stand and step pivot transfer EOB>chair with grossly mod A +2 with RPFRW for support. Pt demonstrating fair LE clearance with stepping with assist needed mostly to manage RW. Pt up in chair at end of session with all needs met. Pt continues to benefit from skilled PT services to progress toward functional mobility goals.     If plan is discharge home, recommend the following: Two people to help with walking and/or transfers;Two people to help with bathing/dressing/bathroom   Can travel by private vehicle        Equipment Recommendations  Other (comment) (defer to next venue)    Recommendations for Other Services       Precautions / Restrictions Precautions Precautions: Fall;Back;Shoulder Type of Shoulder Precautions: elbow/wrist/hand ROM only, WBAT for transfers per op note Precaution Booklet Issued: Yes (comment) Recall of Precautions/Restrictions: Intact Precaution/Restrictions Comments: painful R shoulder, present prior to admission but pt states worse now Required Braces or Orthoses:  Sling Restrictions Weight Bearing Restrictions Per Provider Order: Yes RUE Weight Bearing Per Provider Order: Weight bearing as tolerated RLE Weight Bearing Per Provider Order: Weight bearing as tolerated LLE Weight Bearing Per Provider Order: Weight bearing as tolerated Other Position/Activity Restrictions: Per Dr. Dozier We will allow weightbearing to tolerance given his lower extremity paraplegia and need for his upper extremities     Mobility  Bed Mobility Overal bed mobility: Needs Assistance Bed Mobility: Supine to Sit   Sidelying to sit: Min assist, +2 for physical assistance       General bed mobility comments: pt able to mobilze LEs to EOB, assist to bring off EOB, assist for trunk to midline    Transfers Overall transfer level: Needs assistance Equipment used: Right platform walker Transfers: Sit to/from Stand, Bed to chair/wheelchair/BSC Sit to Stand: Mod assist, +2 physical assistance, From elevated surface   Step pivot transfers: Mod assist, +2 physical assistance       General transfer comment: pt standing from slightly elevated EOB x2 qith mod A +2 to boost to stand. Assist needed to place RUE on platform of RW. fair clearacne with stepping over to chair, mod A +2 mainly to manage RW    Ambulation/Gait                   Stairs             Wheelchair Mobility     Tilt Bed    Modified Rankin (Stroke Patients Only)       Balance Overall balance assessment: History of Falls, Needs assistance Sitting-balance support: Feet supported Sitting balance-Leahy Scale: Poor Sitting balance - Comments: able to maintain with LUE support with close CGA  Standing balance support: Single extremity supported, During functional activity, Reliant on assistive device for balance Standing balance-Leahy Scale: Poor Standing balance comment: reliant on UE support in standign                            Communication  Communication Communication: No apparent difficulties  Cognition Arousal: Alert Behavior During Therapy: WFL for tasks assessed/performed, Anxious   PT - Cognitive impairments: No apparent impairments                         Following commands: Intact      Cueing Cueing Techniques: Verbal cues  Exercises      General Comments General comments (skin integrity, edema, etc.): VSS on RA      Pertinent Vitals/Pain Pain Assessment Pain Assessment: Faces Faces Pain Scale: Hurts a little bit Pain Location: R shoulder, back Pain Descriptors / Indicators: Constant, Discomfort, Grimacing, Guarding    Home Living                          Prior Function            PT Goals (current goals can now be found in the care plan section) Acute Rehab PT Goals Patient Stated Goal: progress mobility PT Goal Formulation: With patient Time For Goal Achievement: 06/05/24 Progress towards PT goals: Progressing toward goals    Frequency    Min 3X/week      PT Plan      Co-evaluation PT/OT/SLP Co-Evaluation/Treatment: Yes Reason for Co-Treatment: Complexity of the patient's impairments (multi-system involvement);To address functional/ADL transfers;For patient/therapist safety PT goals addressed during session: Mobility/safety with mobility;Balance        AM-PAC PT 6 Clicks Mobility   Outcome Measure  Help needed turning from your back to your side while in a flat bed without using bedrails?: A Lot Help needed moving from lying on your back to sitting on the side of a flat bed without using bedrails?: A Lot Help needed moving to and from a bed to a chair (including a wheelchair)?: Total Help needed standing up from a chair using your arms (e.g., wheelchair or bedside chair)?: Total Help needed to walk in hospital room?: Total Help needed climbing 3-5 steps with a railing? : Total 6 Click Score: 8    End of Session Equipment Utilized During Treatment: Other  (comment) (RUE sling) Activity Tolerance: Patient tolerated treatment well Patient left: with call bell/phone within reach;in chair Nurse Communication: Mobility status PT Visit Diagnosis: Unsteadiness on feet (R26.81);Other abnormalities of gait and mobility (R26.89);Difficulty in walking, not elsewhere classified (R26.2);Pain Pain - Right/Left: Right Pain - part of body: Shoulder;Arm     Time: 1030-1053 PT Time Calculation (min) (ACUTE ONLY): 23 min  Charges:    $Therapeutic Activity: 8-22 mins PT General Charges $$ ACUTE PT VISIT: 1 Visit                     Othman Masur R. PTA Acute Rehabilitation Services Office: [phone]   Therisa CHRISTELLA Boor 05/25/2024, 1:29 PM

## 2024-05-26 DIAGNOSIS — M84521A Pathological fracture in neoplastic disease, right humerus, initial encounter for fracture: Secondary | ICD-10-CM | POA: Diagnosis not present

## 2024-05-26 LAB — CULTURE, BLOOD (ROUTINE X 2)
Culture: NO GROWTH
Culture: NO GROWTH

## 2024-05-26 MED ORDER — METHADONE HCL 5 MG PO TABS
7.5000 mg | ORAL_TABLET | Freq: Three times a day (TID) | ORAL | Status: DC
  Administered 2024-05-26 – 2024-05-29 (×9): 7.5 mg via ORAL
  Filled 2024-05-26 (×9): qty 2

## 2024-05-26 NOTE — Progress Notes (Signed)
 PROGRESS NOTE    Joseph [name]  FMW:[id number] DOB: 11/19/1980 DOA: 05/21/2024 PCP: Patient, No Pcp Per   Brief Narrative: Joseph [name] is a 43 y.o. male with a history of asthma, polysubstance abuse, metastatic papillary thyroid  cancer. Patient presented from acute inpatient rehabilitation secondary to right shoulder pain and found to have evidence of a pathologic right proximal humerus fracture. Orthopedic surgery consulted for surgical repair. ENT consulted while admitted and recommended outpatient follow-up. Plan for discharge back to acute inpatient rehabilitation.   Assessment and Plan:  Pathologic right proximal humerus fracture Patient with previously known lucency of right proximal humerus consistent with metastasis. X-ray just prior to this admission significant for a lytic lesion with pathologic fracture of the right proximal humerus with mild displacement. Orthopedic surgery consulted and successfully placed an intramedullary rod on 11/29. -Orthopedic surgery recommendations: weight bearint as tolerated, hand, wrist, elbow only  Metastatic papillary thyroid  cancer Pulmonary nodules and bone lesions concerning for metastatic disease. Recently diagnosed on recent admission. Patient set up to follow-up with medical oncology, Dr. Tina. ENT and endocrinology referral placed on prior admission. ENT consulted, recommending outpatient follow-up in addition to a CT neck. -Order CT neck  History of thoracic spine fracture Pathological in etiology. Patient underwent instrumentation and fusion on prior admission. -Continue pain management  History of pulmonary embolism Recently diagnosed on 11/6. Provoked secondary to metastatic cancer. Patient is managed on Eliquis , which was initially held for surgery and is now resumed. -Continue Eliquis   Paraplegia Neurogenic bladder Patient was admitted from acute inpatient rehabilitation. Patient  -Continue PT/OT  Chronic  normocytic anemia Stable.  Constipation -Continue MiraLAX   Chronic pain Patient's pain medication regimen was managed while in acute inpatient rehab. Patient developed overdose requiring narcan . Analgesics adjusted. Palliative care consulted. Patient started on methadone . -Continue methadone , Lyrica , oxycodone  as needed, Fentanyl  IV as needed  History of substance abuse Noted. Patient counseled on admission.  Constipation Patient now having stool incontinence. -Hold MiraLAX  for now; likely restart daily in 1-2 days   DVT prophylaxis: Eliquis  Code Status:   Code Status: Full Code Family Communication: None at bedside Disposition Plan: Discharge likely back to acute inpatient rehabilitation. Appears to be stable for discharge at this time.   Consultants:  Orthopedic surgery Palliative care medicine  Procedures:  Insertion, intramedullary rod, humerus  Antimicrobials: None    Subjective: No issues this morning except for pain. Hoping to get back to rehab.  Objective: BP 128/74 (BP Location: Left Arm)   Pulse 100   Temp 98.5 F (36.9 C)   Resp 16   Ht 5' 9 (1.753 m)   Wt (!) 142.9 kg   SpO2 97%   BMI 46.52 kg/m   Examination:  General exam: Appears calm and comfortable. Respiratory system: Clear to auscultation. Respiratory effort normal. Cardiovascular system: S1 & S2 heard, RRR.  Gastrointestinal system: Abdomen is nondistended, soft and nontender. Central nervous system: Alert and oriented. Musculoskeletal: No edema. No calf tenderness Psychiatry: Judgement and insight appear normal. Mood & affect appropriate.     Data Reviewed: I have personally reviewed following labs and imaging studies  CBC Lab Results  Component Value Date   WBC 8.0 05/25/2024   RBC 3.23 (L) 05/25/2024   HGB 9.6 (L) 05/25/2024   HCT 29.5 (L) 05/25/2024   MCV 91.3 05/25/2024   MCH 29.7 05/25/2024   PLT PLATELET CLUMPS NOTED ON SMEAR, UNABLE TO ESTIMATE 05/25/2024   MCHC  32.5 05/25/2024   RDW 13.4 05/25/2024  LYMPHSABS 1.5 05/25/2024   MONOABS 0.7 05/25/2024   EOSABS 0.3 05/25/2024   BASOSABS 0.0 05/25/2024     Last metabolic panel Lab Results  Component Value Date   NA 139 05/25/2024   K 4.0 05/25/2024   CL 100 05/25/2024   CO2 27 05/25/2024   BUN 9 05/25/2024   CREATININE 0.67 05/25/2024   GLUCOSE 114 (H) 05/25/2024   GFRNONAA >60 05/25/2024   GFRAA >60 08/16/2019   CALCIUM 8.7 (L) 05/25/2024   PHOS 4.8 (H) 05/25/2024   PROT 6.4 (L) 05/25/2024   ALBUMIN  2.3 (L) 05/25/2024   BILITOT 0.4 05/25/2024   ALKPHOS 95 05/25/2024   AST 19 05/25/2024   ALT 36 05/25/2024   ANIONGAP 12 05/25/2024    GFR: Estimated Creatinine Clearance: 167.7 mL/min (by C-G formula based on SCr of 0.67 mg/dL).  Recent Results (from the past 240 hours)  Culture, blood (Routine X 2) w Reflex to ID Panel     Status: None   Collection Time: 05/21/24 10:58 PM   Specimen: BLOOD  Result Value Ref Range Status   Specimen Description BLOOD BLOOD LEFT HAND  Final   Special Requests   Final    BOTTLES DRAWN AEROBIC AND ANAEROBIC Blood Culture results may not be optimal due to an inadequate volume of blood received in culture bottles   Culture   Final    NO GROWTH 5 DAYS Performed at [facility], [address] [place], [place] [address]    Report Status 05/26/2024 FINAL  Final  Culture, blood (Routine X 2) w Reflex to ID Panel     Status: None   Collection Time: 05/21/24 10:58 PM   Specimen: BLOOD  Result Value Ref Range Status   Specimen Description BLOOD BLOOD LEFT ARM  Final   Special Requests   Final    BOTTLES DRAWN AEROBIC ONLY Blood Culture results may not be optimal due to an inadequate volume of blood received in culture bottles   Culture   Final    NO GROWTH 5 DAYS Performed at [facility], [address], [place], [place] [address]    Report Status 05/26/2024 FINAL  Final      Radiology Studies: CT SOFT TISSUE NECK W  CONTRAST Result Date: 05/26/2024 EXAM: CT NECK WITH CONTRAST 05/25/2024 03:14:00 PM TECHNIQUE: CT of the neck was performed with the administration of 75 mL of iohexol  (OMNIPAQUE ) 350 MG/ML injection. Multiplanar reformatted images are provided for review. Automated exposure control, iterative reconstruction, and/or weight based adjustment of the mA/kV was utilized to reduce the radiation dose to as low as reasonably achievable. COMPARISON: None available. CLINICAL HISTORY: Thyroid  cancer. FINDINGS: AERODIGESTIVE TRACT: No discrete mass. No edema. SALIVARY GLANDS: The parotid and submandibular glands are unremarkable. THYROID : Markedly enlarged left lobe of the thyroid  gland, which has been previously sampled by FNA. LYMPH NODES: No suspicious cervical lymphadenopathy. SOFT TISSUES: No mass or fluid collection. BRAIN, ORBITS, SINUSES AND MASTOIDS: No acute abnormality. LUNGS AND MEDIASTINUM: No acute abnormality. BONES: Posterior fusion hardware of the upper thoracic spine. There is a lucent lesion within the C6 vertebra replacing most of the normal bone marrow. Smaller lesion noted within the dorsal C5 vertebral body. No current evidence of pathologic fracture. IMPRESSION: 1. Markedly enlarged left thyroid  lobe, previously sampled by FNA. 2. C5 and C6 vertebral body metastases without pathologic fracture. 3. No cervical lymphadenopathy. Electronically signed by: Franky Stanford MD 05/26/2024 12:37 AM EST RP Workstation: [id number]  LOS: 5 days    [name], MD [facility] 05/26/2024, 1:44 PM   If 7PM-7AM, please contact night-coverage [email or web address]

## 2024-05-26 NOTE — Progress Notes (Signed)
 CSW met with pt and father regarding SDOH: food, housing.  Housing: pt reports has been living in hotels for extended period of time.  Reports he does work with income up to [employer] per month.  Reports he has attempted to get apartment, has been turned down due to my record.  CSW provided contact information for [employer] coalition and [place] [facility] for housing.  Pt agreeable to call them for assistance in locating permanent housing.  Food: pt reports he had food stamps in the past, lost them once he started working. He is aware he will not qualify if he has significant income.  Food pantry contact list provided to supplement if needed.  Father reports he and his wife will be assisting pt moving forward in these areas. Cathlyn Ferry, MSW, LCSW 12/4/202511:09 AM

## 2024-05-26 NOTE — Progress Notes (Signed)
 Physical Therapy Treatment Patient Details Name: Joseph [name] MRN: [id number] DOB: [birth year] Today's Date: 05/26/2024   History of Present Illness Pt is a 43 y.o. male presenting 11/28 from CIR with R shoulder pain; found to have R humeral pathologic fx. S/p IM rod placement 11/29. 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 At CIR 11/24-11/28. PMH: cocaine use disorder, cannibis use disorder.    PT Comments  Pt resting in bed on arrival, pleasant and agreeable to session with pt demonstrating good progress towards acute goals this session. Pt requiring min A to complete bed mobility, mod A +2 for transfers sit<>stand and min A +2 to progress gait away from EOB with PFRW for support. Pt continues to be limited by LE weakness, limiting standing tolerance. Pt performing seated LE exercises for increased ROM and strength and verbalizing understanding of benefits and importance of completion throughout day between therapies. Pt up in chair at end of session with all needs met. Pt continues to benefit from skilled PT services to progress toward functional mobility goals.     If plan is discharge home, recommend the following: Two people to help with walking and/or transfers;Two people to help with bathing/dressing/bathroom   Can travel by private vehicle        Equipment Recommendations  Other (comment) (defer to next venue)    Recommendations for Other Services       Precautions / Restrictions Precautions Precautions: Fall;Back;Shoulder Type of Shoulder Precautions: elbow/wrist/hand ROM only, WBAT for transfers per op note Precaution Booklet Issued: Yes (comment) Recall of Precautions/Restrictions: Intact Precaution/Restrictions Comments: painful R shoulder, present prior to admission  but pt states worse now Required Braces or Orthoses: Sling Restrictions Weight Bearing Restrictions Per Provider Order: Yes RUE Weight Bearing Per Provider Order: Weight bearing as tolerated RLE Weight Bearing Per Provider Order: Weight bearing as tolerated LLE Weight Bearing Per Provider Order: Weight bearing as tolerated Other Position/Activity Restrictions: Per Dr. Dozier We will allow weightbearing to tolerance given his lower extremity paraplegia and need for his upper extremities     Mobility  Bed Mobility Overal bed mobility: Needs Assistance Bed Mobility: Supine to Sit     Supine to sit: Used rails, Min assist     General bed mobility comments: pt able to mobilze LEs to EOB, assist to bring off EOB, assist for trunk to midline    Transfers Overall transfer level: Needs assistance Equipment used: Right platform walker Transfers: Sit to/from Stand Sit to Stand: Mod assist, +2 physical assistance, From elevated surface           General transfer comment: pt standing from slightly elevated EOB with mod A +2 to boost to stand. Assist needed to place RUE on platform of RW.    Ambulation/Gait Ambulation/Gait assistance: Min assist, +2 physical assistance, +2 safety/equipment Gait Distance (Feet): 4 Feet Assistive device: Right platform walker Gait Pattern/deviations: Trunk flexed, Step-to pattern Gait velocity: decr     General Gait Details: pt taking steps away from EOB and then over to chair, fatigues quickly, step to pattern with low clearance on R compared to L.   Stairs             Wheelchair Mobility     Tilt Bed    Modified Rankin (Stroke Patients Only)       Balance Overall  balance assessment: History of Falls, Needs assistance Sitting-balance support: Feet supported Sitting balance-Leahy Scale: Fair Sitting balance - Comments: able to maintain at EOB with CGA   Standing balance support: Single extremity supported, During functional  activity, Reliant on assistive device for balance Standing balance-Leahy Scale: Poor Standing balance comment: reliant on UE support in standign                            Communication Communication Communication: No apparent difficulties  Cognition Arousal: Alert Behavior During Therapy: WFL for tasks assessed/performed, Anxious   PT - Cognitive impairments: No apparent impairments                         Following commands: Intact      Cueing Cueing Techniques: Verbal cues  Exercises General Exercises - Lower Extremity Long Arc Quad: AROM, Both, 20 reps Hip Flexion/Marching: AROM, Both, 20 reps    General Comments General comments (skin integrity, edema, etc.): VSS on RA, p father present and supportive      Pertinent Vitals/Pain Pain Assessment Pain Assessment: Faces Faces Pain Scale: Hurts a little bit Pain Location: R shoulder, back Pain Descriptors / Indicators: Constant, Discomfort, Grimacing, Guarding Pain Intervention(s): Monitored during session, Limited activity within patient's tolerance, Repositioned    Home Living                          Prior Function            PT Goals (current goals can now be found in the care plan section) Acute Rehab PT Goals Patient Stated Goal: progress mobility PT Goal Formulation: With patient Time For Goal Achievement: 06/05/24 Progress towards PT goals: Progressing toward goals    Frequency    Min 3X/week      PT Plan      Co-evaluation              AM-PAC PT 6 Clicks Mobility   Outcome Measure  Help needed turning from your back to your side while in a flat bed without using bedrails?: A Lot Help needed moving from lying on your back to sitting on the side of a flat bed without using bedrails?: A Lot Help needed moving to and from a bed to a chair (including a wheelchair)?: Total Help needed standing up from a chair using your arms (e.g., wheelchair or bedside  chair)?: Total Help needed to walk in hospital room?: Total Help needed climbing 3-5 steps with a railing? : Total 6 Click Score: 8    End of Session Equipment Utilized During Treatment: Other (comment) (RUE sling) Activity Tolerance: Patient tolerated treatment well Patient left: with call bell/phone within reach;in chair;with family/visitor present Nurse Communication: Mobility status PT Visit Diagnosis: Unsteadiness on feet (R26.81);Other abnormalities of gait and mobility (R26.89);Difficulty in walking, not elsewhere classified (R26.2);Pain Pain - Right/Left: Right Pain - part of body: Shoulder;Arm     Time: 8941-8888 PT Time Calculation (min) (ACUTE ONLY): 13 min  Charges:    $Gait Training: 8-22 mins PT General Charges $$ ACUTE PT VISIT: 1 Visit                     Abdias Hickam R. PTA Acute Rehabilitation Services Office: [phone]   Therisa CHRISTELLA Boor 05/26/2024, 12:14 PM

## 2024-05-26 NOTE — Progress Notes (Signed)
 Daily Progress Note   Patient Name: Joseph [name]       Date: 05/26/2024 DOB: 04-Sep-1980  Age: 43 y.o. MRN#: [id number] Attending Physician: Briana Elgin LABOR, MD Primary Care Physician: Patient, No Pcp Per Admit Date: 05/21/2024  Reason for Consultation/Follow-up: Pain control  Subjective: Medical records reviewed including progress notes, labs and imaging, MAR. He received 4 doses of PRN Fentanyl  and 4 doses of PRN Oxycodone  in the past 24 hours. Less use of IV Fentanyl  than yesterday. Patient assessed at the bedside. He shares that he is feeling motivated and glad for his improved strength since discharge. Trying to stay in good spirits despite not getting his IV pain medicines as soon as he feels would have been helpful. No visitors present.  We reviewed plan to increase dose of methadone  today. Reviewed EKG tomorrow and rationale. He wonders if he will be able to go back to rehab soon. I shared that PMT would be able to continue to follow in CIR for titration of methadone  as necessary and that I would confirm discharge referral to outpatient palliative care at the cancer center as well. He is very adult nurse.  Questions and concerns addressed. PMT will continue to support holistically.  Length of Stay: 5   Physical Exam Vitals and nursing note reviewed.  Constitutional:      General: He is not in acute distress.    Appearance: He is ill-appearing.  HENT:     Head: Normocephalic and atraumatic.  Cardiovascular:     Rate and Rhythm: Normal rate.  Pulmonary:     Effort: Pulmonary effort is normal.  Skin:    General: Skin is warm and dry.  Neurological:     Mental Status: He is alert and oriented to person, place, and time.  Psychiatric:        Behavior: Behavior normal.            Vital Signs: BP 128/74 (BP Location: Left  Arm)   Pulse 100   Temp 98.5 F (36.9 C)   Resp 16   Ht 5' 9 (1.753 m)   Wt (!) 142.9 kg   SpO2 97%   BMI 46.52 kg/m  SpO2: SpO2: 97 % O2 Device: O2 Device: Room Air O2 Flow Rate: O2 Flow Rate (L/min): 1 L/min      Palliative Care Assessment & Plan   Patient Profile: 43 y.o. male  with past medical history of childhood asthma, polysubstance abuse, and recently diagnosed metastatic papillary thyroid  cancer admitted from CIR on 05/21/2024 with pathologic right proximal humerus fracture.    Patient initially presented to the hospital  on 04/27/2024 with back pain and difficulty ambulating. He was found to have pulmonary embolism, pathologic T2 fracture, and large thyroid  nodule. Papillary carcinoma was diagnosed with thyroid  biopsy from 05/04/2024.  He underwent T1-T3 laminectomy and T2 corpectomy for resection of metastatic tumor on 05/05/2024. Patient was seen by PMT for pain management during this admission. He was discharged to inpatient rehabilitation on 05/16/2024.    PMT has been consulted to assist with pain management s/p ORIF of proximal humerus fracture and excessive sedation requiring narcan .  Assessment: Acute right shoulder pain S/p ORIF of right humerus due to pathological fracture Recently diagnosed metastatic thyroid  cancer   Recommendations/Plan: Increase to Methadone  7.5 mg Q8H today Continue Oxycodone  20 mg IR Q4H PRN for breakthrough pain  Continue Fentanyl  IV 25 mcg Q2H PRN for breakthrough pain, would still try PRN Oxycodone  first if able Ordered EKG for tomorrow to assess QTC Placed referral to outpatient palliative care clinic at [facility]. Patient would benefit from PMT consult in CIR for possible titration of methadone  if new dose remains sub optimal PMT will continue to follow and support   Prognosis:  Unable to determine  Discharge Planning: Hopeful for CIR  Care plan was discussed with patient, RN, MD   Mickle [name], PA-C  Palliative Medicine  Team Team phone # [phone]  Thank you for allowing the Palliative Medicine Team to assist in the care of this patient. Please utilize secure chat with additional questions, if there is no response within 30 minutes please call the above phone number.  Palliative Medicine Team providers are available by phone from 7am to 7pm daily and can be reached through the team cell phone.  Should this patient require assistance outside of these hours, please call the patient's attending physician.    Billing based on MDM: High  Problems Addressed: One acute or chronic illness or injury that poses a threat to life or bodily function  Amount and/or Complexity of Data: Category 1:Review of prior external note(s) from each unique source, Review of the result(s) of each unique test, and Assessment requiring an independent historian(s), Category 2:Independent interpretation of a test performed by another physician/other qualified health care professional (not separately reported), and Category 3:Discussion of management or test interpretation with external physician/other qualified health care professional/appropriate source (not separately reported)  Risks: Parenteral controlled substances

## 2024-05-26 NOTE — Progress Notes (Signed)
 Assessment 43 year old male with history of morbid obesity BMI 46 who is 3 weeks status post T2 corpectomy for the treatment of pathologic fracture and high-grade spinal cord compression due to metastatic capillary thyroid  carcinoma  LOS: 5 days    Plan: Sutures removed Will plan for follow-up in 3 weeks Clear from my standpoint for systemic treatment   Subjective: Patient requiring high doses of narcotic pain medication.  Patient complaining of pain in the neck and right arm which are both postsurgical.  Objective: Vital signs in last 24 hours: Temp:  [97.9 F (36.6 C)-99.8 F (37.7 C)] 98.5 F (36.9 C) (12/04 0737) Pulse Rate:  [99-104] 100 (12/04 0737) Resp:  [16-18] 16 (12/04 0737) BP: (128-151)/(74-84) 128/74 (12/04 0737) SpO2:  [97 %-99 %] 97 % (12/04 0737)  Intake/Output from previous day: No intake/output data recorded. Intake/Output this shift: Total I/O In: 240 [P.O.:240] Out: 1600 [Urine:1600]  Exam: RUE: 5/5 grip LUE: 5/5 grip BLE: 4/5 throughout Improved sensation BLE compared to preop  Lab Results: Recent Labs    05/24/24 0435 05/25/24 0425  WBC 7.7 8.0  HGB 9.1* 9.6*  HCT 28.9* 29.5*  PLT PLATELET CLUMPS NOTED ON SMEAR, UNABLE TO ESTIMATE PLATELET CLUMPS NOTED ON SMEAR, UNABLE TO ESTIMATE   BMET Recent Labs    05/24/24 0435 05/25/24 0425  NA 137 139  K 4.2 4.0  CL 102 100  CO2 27 27  GLUCOSE 113* 114*  BUN 12 9  CREATININE 0.74 0.67  CALCIUM 8.4* 8.7*    Studies/Results: CT SOFT TISSUE NECK W CONTRAST Result Date: 05/26/2024 EXAM: CT NECK WITH CONTRAST 05/25/2024 03:14:00 PM TECHNIQUE: CT of the neck was performed with the administration of 75 mL of iohexol  (OMNIPAQUE ) 350 MG/ML injection. Multiplanar reformatted images are provided for review. Automated exposure control, iterative reconstruction, and/or weight based adjustment of the mA/kV was utilized to reduce the radiation dose to as low as reasonably achievable. COMPARISON: None  available. CLINICAL HISTORY: Thyroid  cancer. FINDINGS: AERODIGESTIVE TRACT: No discrete mass. No edema. SALIVARY GLANDS: The parotid and submandibular glands are unremarkable. THYROID : Markedly enlarged left lobe of the thyroid  gland, which has been previously sampled by FNA. LYMPH NODES: No suspicious cervical lymphadenopathy. SOFT TISSUES: No mass or fluid collection. BRAIN, ORBITS, SINUSES AND MASTOIDS: No acute abnormality. LUNGS AND MEDIASTINUM: No acute abnormality. BONES: Posterior fusion hardware of the upper thoracic spine. There is a lucent lesion within the C6 vertebra replacing most of the normal bone marrow. Smaller lesion noted within the dorsal C5 vertebral body. No current evidence of pathologic fracture. IMPRESSION: 1. Markedly enlarged left thyroid  lobe, previously sampled by FNA. 2. C5 and C6 vertebral body metastases without pathologic fracture. 3. No cervical lymphadenopathy. Electronically signed by: Franky Stanford MD 05/26/2024 12:37 AM EST RP Workstation: [id number]      Dorn JONELLE Glade 05/26/2024, 1:20 PM

## 2024-05-27 DIAGNOSIS — M84521A Pathological fracture in neoplastic disease, right humerus, initial encounter for fracture: Secondary | ICD-10-CM | POA: Diagnosis not present

## 2024-05-27 MED ORDER — POLYETHYLENE GLYCOL 3350 17 G PO PACK
17.0000 g | PACK | Freq: Every day | ORAL | Status: DC
  Administered 2024-05-27 – 2024-05-29 (×3): 17 g via ORAL
  Filled 2024-05-27 (×3): qty 1

## 2024-05-27 MED ORDER — HYDROMORPHONE HCL 1 MG/ML IJ SOLN
0.5000 mg | INTRAMUSCULAR | Status: DC | PRN
  Administered 2024-05-27 – 2024-05-29 (×4): 1 mg via INTRAVENOUS
  Filled 2024-05-27 (×4): qty 1

## 2024-05-27 MED ORDER — OXYCODONE HCL 5 MG PO TABS
30.0000 mg | ORAL_TABLET | ORAL | Status: DC | PRN
  Administered 2024-05-27 – 2024-05-29 (×7): 30 mg via ORAL
  Filled 2024-05-27 (×7): qty 6

## 2024-05-27 NOTE — Progress Notes (Signed)
 ENT CARE PLAN UPDATE: HPI: Joseph [name] is an 43 y.o. male with a history of asthma, polysubstance abuse, metastatic papillary thyroid  cancer. Patient presented from acute inpatient rehabilitation secondary to right shoulder pain and found to have evidence of a pathologic right proximal humerus fracture. Orthopedic surgery consulted for surgical repair and underwent insertion of the intramedullary rod of his humerus with general biopsy of the right humeral lesion as well on 11/29. Pathology demonstrating thyroid  carcinoma.    Relevant recent history -Also with pathological thoracic fracture with pathology positive for PTC - 05/05/24.    US  Thyroid  - Nodule # 1: Large irregular isoechoic predominantly solid nodule with areas of internal cystic change occupies the majority of the left gland. The mass measures 8.2 x 4.5 x 4.9 cm. There are a few dystrophic calcifications. Overall, this constitutes TI-RADS category 4. **Given size (>/= 1.5 cm) and appearance, fine needle aspiration of this moderately suspicious nodule should be considered based on TI-RADS criteria.  Past Medical History:  Diagnosis Date   Anxiety    Asthma    as a child   Depression    Narcotic abuse (HCC)    Pulmonary nodules 04/29/2024    Past Surgical History:  Procedure Laterality Date   CLOSED REDUCTION FINGER WITH PERCUTANEOUS PINNING Right 06/22/2013   Procedure: RIGHT HAND SMALL FINGER CLOSED MANIPULATION AND PINNING;  Surgeon: Prentice LELON Pagan, MD;  Location: [facility] OR;  Service: Orthopedics;  Laterality: Right;   HUMERUS IM NAIL Right 05/21/2024   Procedure: INSERTION, INTRAMEDULLARY ROD, HUMERUS;  Surgeon: Dozier Soulier, MD;  Location: [facility] OR;  Service: Orthopedics;  Laterality: Right;   I & D EXTREMITY  03/24/2012   Procedure: IRRIGATION AND DEBRIDEMENT EXTREMITY;  Surgeon: Oneil JAYSON Herald, MD;  Location: [facility] OR;  Service: Orthopedics;  Laterality: Right;  Right Forearm   POSTERIOR CERVICAL FUSION/FORAMINOTOMY N/A  05/05/2024   Procedure: POSTERIOR CERVICAL FUSION CERVICAL SEVEN-THORACIC ONE, THORACIC ONE-THORACIC TWO, THORACIC TWO-THORACIC THREE ,FOR RESECTION OF TUMOR THORACIC TWO CORPECTOMY;  Surgeon: Darnella Dorn SAUNDERS, MD;  Location: [facility] OR;  Service: Neurosurgery;  Laterality: N/A;    Family History  Problem Relation Age of Onset   Other Other    Alcohol abuse Other     Social History:  reports that he has been smoking cigarettes. He has a 10 pack-year smoking history. He has never used smokeless tobacco. He reports that he does not currently use alcohol. He reports current drug use. Drug: Marijuana.  No results found for this or any previous visit (from the past 48 hours).  CT SOFT TISSUE NECK W CONTRAST Result Date: 05/26/2024 EXAM: CT NECK WITH CONTRAST 05/25/2024 03:14:00 PM TECHNIQUE: CT of the neck was performed with the administration of 75 mL of iohexol  (OMNIPAQUE ) 350 MG/ML injection. Multiplanar reformatted images are provided for review. Automated exposure control, iterative reconstruction, and/or weight based adjustment of the mA/kV was utilized to reduce the radiation dose to as low as reasonably achievable. COMPARISON: None available. CLINICAL HISTORY: Thyroid  cancer. FINDINGS: AERODIGESTIVE TRACT: No discrete mass. No edema. SALIVARY GLANDS: The parotid and submandibular glands are unremarkable. THYROID : Markedly enlarged left lobe of the thyroid  gland, which has been previously sampled by FNA. LYMPH NODES: No suspicious cervical lymphadenopathy. SOFT TISSUES: No mass or fluid collection. BRAIN, ORBITS, SINUSES AND MASTOIDS: No acute abnormality. LUNGS AND MEDIASTINUM: No acute abnormality. BONES: Posterior fusion hardware of the upper thoracic spine. There is a lucent lesion within the C6 vertebra replacing most of the normal bone marrow. Smaller  lesion noted within the dorsal C5 vertebral body. No current evidence of pathologic fracture. IMPRESSION: 1. Markedly enlarged left thyroid  lobe,  previously sampled by FNA. 2. C5 and C6 vertebral body metastases without pathologic fracture. 3. No cervical lymphadenopathy. Electronically signed by: Franky Stanford MD 05/26/2024 12:37 AM EST RP Workstation: [id number]    Assessment/Plan: 11M with metastatic papillary thyroid  carcinoma. Positive path in left thyroid  nodule, thoracic spine and humerus. Concern for pulmonary metastatic disease.    - No respiratory distresss or difficulty swallowing - CT neck w/ contrast completed and reviewed by me  - no lateral or central cervical lymphadenopathy noted - Will discuss at multidisciplinary tumor board, 06/08/24.  - Follow up outpatient with me at [facility].  Penne Croak 05/27/2024 11:25 AM Available on Haiku chat and [employer]  Department of Otolaryngology Contact Info: Otolaryngology [employer] including questions about appointments or questions: [phone] - If after normal business hours (Monday-Friday after 5PM or Weekends/Holidays), please call same number and follow prompts for Patient Access Line. There is a physician on call for urgent matters. For life threatening emergencies, please call 911

## 2024-05-27 NOTE — Progress Notes (Signed)
 Inpatient Rehab Admissions Coordinator:   We continue to follow for potential return to CIR.  Rad/onc plan to discuss patient on Monday at brain/spine conference.  Palliative working on pain control, still requiring IV pain control, dosed Q2 hrs and received 6 doses yesterday.  He is mobilizing very well with therapy! I'm so glad to see him up and starting to ambulate, which is a remarkable improvement from his previous level.  Discussed with Dr. Cornelio yesterday and she and I will f/u with patient next week after rad/onc discussions.   Reche Lowers, PT, DPT Admissions Coordinator [phone] 05/27/24 10:08 AM

## 2024-05-27 NOTE — Progress Notes (Signed)
 Occupational Therapy Treatment Patient Details Name: Joseph [name] MRN: [id number] DOB: February 10, 1981 Today's Date: 05/27/2024   History of present illness Pt is a 43 y.o. male presenting 11/28 from CIR with R shoulder pain; found to have R humeral pathologic fx. S/p IM rod placement 11/29. 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 At CIR 11/24-11/28. PMH: cocaine use disorder, cannibis use disorder.   OT comments  Pt remains eager to work with therapies. Emphasis on bathing and grooming tasks along with transfer OOB in prep for PT session later. Pt continues to require a fair amount of ADL assist due to impaired RUE use. However, pt able to progress bed mobility to Supervision and transfers with R platform walker to Min-Mod A x 2. Continue to recommend intensive rehab services upon DC based on high PLOF and good rehab potential.      If plan is discharge home, recommend the following:  A lot of help with walking and/or transfers;Two people to help with walking and/or transfers;A lot of help with bathing/dressing/bathroom;Two people to help with bathing/dressing/bathroom   Equipment Recommendations  Other (comment) (TBD)    Recommendations for Other Services Rehab consult    Precautions / Restrictions Precautions Precautions: Fall;Back;Shoulder Type of Shoulder Precautions: elbow/wrist/hand ROM only, WBAT for transfers per op note Shoulder Interventions: Shoulder sling/immobilizer Precaution Booklet Issued: Yes (comment) Recall of Precautions/Restrictions: Intact Required Braces or Orthoses: Sling Restrictions Weight Bearing Restrictions Per Provider Order: Yes RUE Weight Bearing Per Provider Order: Weight bearing as tolerated RLE Weight Bearing Per Provider Order: Weight bearing as  tolerated LLE Weight Bearing Per Provider Order: Weight bearing as tolerated Other Position/Activity Restrictions: Per Dr. Dozier We will allow weightbearing to tolerance given his lower extremity paraplegia and need for his upper extremities       Mobility Bed Mobility Overal bed mobility: Needs Assistance Bed Mobility: Supine to Sit     Supine to sit: Supervision, Used rails     General bed mobility comments: use of bed rails and increased time    Transfers Overall transfer level: Needs assistance Equipment used: Right platform walker Transfers: Sit to/from Stand, Bed to chair/wheelchair/BSC Sit to Stand: Mod assist, +2 physical assistance, +2 safety/equipment     Step pivot transfers: Min assist, +2 physical assistance, +2 safety/equipment     General transfer comment: Mod A x 2 to stand from bedside and assist RUE onto platform. Min A x 2 to pivot to recliner with less physical assist, more cueing for step sequence and assist to keep walker close for increased support. R platform also adjusted to allow pt to more comfortably rest UE on it     Balance Overall balance assessment: History of Falls, Needs assistance Sitting-balance support: Feet supported Sitting balance-Leahy Scale: Fair     Standing balance support: Single extremity supported, During functional activity, Reliant on assistive device for balance Standing balance-Leahy Scale: Poor                             ADL either performed or assessed with clinical judgement   ADL Overall ADL's : Needs assistance/impaired     Grooming: Set up;Moderate assistance;Wash/dry face;Applying deodorant Grooming Details (indicate cue type and reason): Setup to wash face and ears, Mod A to don deodorant - assist to don  under L arm. Pt able to use LUE to don under R arm Upper Body Bathing: Moderate assistance;Sitting   Lower Body Bathing: Maximal assistance;Sit to/from stand;Sitting/lateral leans Lower Body  Bathing Details (indicate cue type and reason): assist to bathe bottom in standing Upper Body Dressing : Maximal assistance;Sitting   Lower Body Dressing: Maximal assistance;Sit to/from stand;Sitting/lateral leans Lower Body Dressing Details (indicate cue type and reason): sock mgmt, pt unable to reach or manage with one UE                    Extremity/Trunk Assessment Upper Extremity Assessment Upper Extremity Assessment: Right hand dominant;RUE deficits/detail RUE Deficits / Details: elbow/wrist/hand ROM WFL. RUE Coordination: decreased gross motor   Lower Extremity Assessment Lower Extremity Assessment: Defer to PT evaluation        Vision   Vision Assessment?: No apparent visual deficits   Perception     Praxis     Communication Communication Communication: No apparent difficulties   Cognition Arousal: Alert Behavior During Therapy: WFL for tasks assessed/performed, Anxious Cognition: No apparent impairments                               Following commands: Intact        Cueing   Cueing Techniques: Verbal cues  Exercises      Shoulder Instructions       General Comments      Pertinent Vitals/ Pain       Pain Assessment Pain Assessment: Faces Faces Pain Scale: Hurts little more Pain Location: R shoulder Pain Descriptors / Indicators: Grimacing, Guarding Pain Intervention(s): Monitored during session, Patient requesting pain meds-RN notified  Home Living                                          Prior Functioning/Environment              Frequency  Min 2X/week        Progress Toward Goals  OT Goals(current goals can now be found in the care plan section)  Progress towards OT goals: Progressing toward goals  Acute Rehab OT Goals Patient Stated Goal: continue working with therapy, avoid taking pain meds as much if possible OT Goal Formulation: With patient Time For Goal Achievement:  06/05/24 Potential to Achieve Goals: Good ADL Goals Pt Will Perform Grooming: with set-up;sitting Pt Will Perform Upper Body Bathing: with min assist;sitting Pt Will Transfer to Toilet: with min assist;with +2 assist;stand pivot transfer Pt Will Perform Toileting - Clothing Manipulation and hygiene: with mod assist;sitting/lateral leans;sit to/from stand Pt/caregiver will Perform Home Exercise Program: Right Upper extremity;With written HEP provided Additional ADL Goal #1: Pt to complete bed mobility with Min A in prep for EOB/OOB ADLs  Plan      Co-evaluation                 AM-PAC OT 6 Clicks Daily Activity     Outcome Measure   Help from another person eating meals?: None Help from another person taking care of personal grooming?: A Little Help from another person toileting, which includes using toliet, bedpan, or urinal?: Total Help from another person bathing (including washing, rinsing, drying)?: A Lot Help from another person to put on and taking off regular upper body clothing?: A Lot Help from another person to put  on and taking off regular lower body clothing?: Total 6 Click Score: 13    End of Session Equipment Utilized During Treatment: Gait belt;Other (comment) (R platform walker)  OT Visit Diagnosis: Unsteadiness on feet (R26.81);Other abnormalities of gait and mobility (R26.89);Muscle weakness (generalized) (M62.81);History of falling (Z91.81);Pain Pain - Right/Left: Right Pain - part of body: Shoulder   Activity Tolerance Patient tolerated treatment well   Patient Left in chair;with call bell/phone within reach   Nurse Communication Mobility status;Patient requests pain meds        Time: 8967-8897 OT Time Calculation (min): 30 min  Charges: OT General Charges $OT Visit: 1 Visit OT Treatments $Self Care/Home Management : 8-22 mins $Therapeutic Activity: 8-22 mins  Mliss NOVAK, OTR/L Acute Rehab Services Office: [phone]   Mliss Fish 05/27/2024, 12:18 PM

## 2024-05-27 NOTE — Progress Notes (Signed)
 Daily Progress Note   Patient Name: Joseph [name]       Date: 05/27/2024 DOB: 30-Sep-1980  Age: 43 y.o. MRN#: [id number] Attending Physician: Briana Elgin LABOR, MD Primary Care Physician: Patient, No Pcp Per Admit Date: 05/21/2024  Reason for Consultation/Follow-up: Establishing goals of care  Subjective: I have reviewed medical records including: EPIC notes: Hospitalist, nursing, PT/OT, otolaryngology, neurosurgery, TOC, PMT.  Patient's case to be discussed during tumor board on 12/17 [facility]: Receiving for pain - Flexeril  3 times daily, lidocaine  patch, Robaxin  4 times daily, Lyrica  3 times daily, methadone  every 8 hours, 25 mcg IV fentanyl  as needed, 20 mg oxycodone  as needed.  Methadone  7.5 mg started 12/4 at 1300, which was an increase from 5 mg.  Bowel regimen includes MiraLAX  daily and Senokot twice daily.  As needed medications administered in the last 24 hours: Oxycodone  x 3, fentanyl  x 4 Available advanced directives in ACP: none Labs: Creatinine 0.67 assessed for opioid prescribing, hemoglobin 9.6, albumin  2.3 assessed for overall health, nutritional status, and disease severity, helping predict prognosis and guide care. Tests: EKG to assess QTc after methadone  increment has not been performed yet today  Received report from primary RN - no acute concerns.  RN reports patient is eating and drinking and has been up to the chair today.  Requested RN to perform EKG.  Went to visit patient at bedside - aunt present.  Patient was sitting up in chair awake, alert, oriented, and able to participate in conversation. Signs of discomfort noted to include grimacing with movement. No respiratory distress, increased work of breathing, or secretions noted.   Emotional support provided to patient and  family.  Patient tells me that his pain is all right.  He reports a 7-8/10 in his shoulder and back, near surgical site.  He reports pain feels sore, sharp, and consistent.  He endorses pain worsens with movement/specific positions which is consistent with musculoskeletal pain.  He is unsure if muscle relaxers are helping his pain due to all other medications he is on, which is understandable.  Patient tells me that oxycodone  helps a little bit, for a little while, about 1.5 hours, but wears off.  Education provided that methadone  would be continued at current dose for a minimum of 3 days prior to any further adjustments.  Reviewed symptom management  plan to include discontinuing fentanyl  and starting Dilaudid  as this opioid has a longer therapeutic effect.  Will also increase oxycodone .  Encourage patient to utilize oral prior to IV pain medications.  Patient expressed understanding and is agreeable.  Patient endorses his last bowel movement was 1 to 2 days ago.  Documentation shows last BM 12/3. Education provided on the importance of adequate bowel regimen in context of taking opioids.  Patient and family expressed appreciation for PMT assistance.  Patient confirms goal is for discharge to CIR.  Patient understands that adequate pain management is important for optimal rehabilitation experience. He remains motivated to work with therapies.   All questions and concerns addressed.   4:17 PM EKG not yet performed. Asked RN to obtain.   Length of Stay: 6  Current Medications: Scheduled Meds:   apixaban   5 mg Oral BID   cyclobenzaprine   10 mg Oral TID   guaiFENesin   1,200 mg Oral BID   iron  polysaccharides  150 mg Oral Daily   lidocaine   1 patch Transdermal Daily   methadone   7.5 mg Oral Q8H   methocarbamol   750 mg Oral TID AC & HS   multivitamin with minerals  1 tablet Oral Daily   pregabalin   50 mg Oral TID   Ensure Max Protein  11 oz Oral BID   senna-docusate  2 tablet Oral BID     Continuous Infusions:   PRN Meds: acetaminophen  **OR** acetaminophen , albuterol , bisacodyl , fentaNYL  (SUBLIMAZE ) injection, ondansetron  **OR** ondansetron  (ZOFRAN ) IV, oxyCODONE , sodium phosphate   Physical Exam Vitals and nursing note reviewed.  Constitutional:      General: He is not in acute distress. Pulmonary:     Effort: No respiratory distress.  Skin:    General: Skin is warm and dry.  Neurological:     Mental Status: He is alert and oriented to person, place, and time.     Motor: Weakness present.  Psychiatric:        Attention and Perception: Attention normal.        Behavior: Behavior is cooperative.        Cognition and Memory: Cognition and memory normal.             Vital Signs: BP (!) 151/128   Pulse 96   Temp 98.2 F (36.8 C)   Resp 18   Ht 5' 9 (1.753 m)   Wt (!) 142.9 kg   SpO2 97%   BMI 46.52 kg/m  SpO2: SpO2: 97 % O2 Device: O2 Device: Room Air O2 Flow Rate: O2 Flow Rate (L/min): 1 L/min  Intake/output summary:  Intake/Output Summary (Last 24 hours) at 05/27/2024 1202 Last data filed at 05/27/2024 0553 Gross per 24 hour  Intake 240 ml  Output 3700 ml  Net -3460 ml   LBM: Last BM Date : 05/24/24 Baseline Weight: Weight: (!) 142.9 kg Most recent weight: Weight: (!) 142.9 kg       Palliative Assessment/Data: PPS 50%      Patient Active Problem List   Diagnosis Date Noted   Malignant neoplasm metastatic to bone (HCC) 05/24/2024   Normocytic anemia 05/21/2024   Pathologic humeral fracture 05/21/2024   Pathological fracture of right humerus 05/20/2024   Incomplete paraplegia (HCC) 05/17/2024   Pathologic fracture of thoracic vertebrae 05/16/2024   Papillary thyroid  carcinoma (HCC) 05/10/2024   Malignant neoplasm metastatic to both lungs (HCC) 05/10/2024   Metastatic cancer to spine (HCC) 05/06/2024   Papillary carcinoma (HCC) 05/06/2024   Thoracic spine fracture (HCC)  05/02/2024   Ataxia 05/01/2024   Right shoulder pain  05/01/2024   Pulmonary HTN (HCC) 04/29/2024   Low back pain 04/29/2024   Pulmonary nodules 04/29/2024   Thyroid  nodule 04/29/2024   Pulmonary embolism (HCC) 04/28/2024   Abscess 03/31/2018   Thrombocytopenia 03/31/2018   Depression 03/31/2018   Abscess of hand 03/31/2018   Abscess of right hand 03/31/2018   GAD (generalized anxiety disorder) 12/04/2014   Alcohol use disorder, severe, dependence (HCC) 12/03/2014   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 male  with past medical history of childhood asthma, polysubstance abuse, and recently diagnosed metastatic papillary thyroid  cancer admitted from CIR on 05/21/2024 with pathologic right proximal humerus fracture.    Patient initially presented to the hospital on 04/27/2024 with back pain and difficulty ambulating. He was found to have pulmonary embolism, pathologic T2 fracture, and large thyroid  nodule. Papillary carcinoma was diagnosed with thyroid  biopsy from 05/04/2024.  He underwent T1-T3 laminectomy and T2 corpectomy for resection of metastatic tumor on 05/05/2024. Patient was seen by PMT for pain management during this admission. He was discharged to inpatient rehabilitation on 05/16/2024.    PMT has been consulted to assist with pain management s/p ORIF of proximal humerus fracture and excessive sedation requiring narcan .  Assessment: Principal Problem:   Pathological fracture of right humerus Active Problems:   Pulmonary embolism (HCC)   Metastatic cancer to spine (HCC)   Papillary thyroid  carcinoma (HCC)   Malignant neoplasm metastatic to both lungs (HCC)   Incomplete paraplegia (HCC)   Normocytic anemia   Pathologic  humeral fracture   Malignant neoplasm metastatic to bone [facility])     Recommendations/Plan: Continue current plan of care Continue full code as previously documented Patient's goal is discharge to CIR Continue methadone  7.5 mg every 8 hours for minimum of 3 days prior to any further increases.  Current dose started on 12/4 at 1300 Discontinued IV fentanyl . Started IV dilaudid  0.5-1mg  every 3 hours as needed for breakthrough pain after oral oxycodone  Oxycodone  increased from 20 to 30mg  every 4 hours as needed Referral to outpatient Palliative Medicine clinic at [facility] previously placed Patient would benefit from PMT consult in CIR for possible titration of methadone  if new dose remains suboptimal  PMT will continue to follow and support holistically   Goals of Care and Additional Recommendations: Limitations on Scope of Treatment: Full Scope Treatment  Code Status:    Code Status Orders  (From admission, onward)           Start     Ordered   05/21/24 0130  Full code  Continuous       Question:  By:  Answer:  Consent: discussion documented in EHR   05/21/24 0131           Code Status History     Date Active Date Inactive Code Status Order ID Comments User Context   05/16/2024 1529 05/21/2024 0023 Full Code [id number]  Pegge Toribio JINNY DEVONNA Inpatient   05/16/2024 1529 05/16/2024 1529 Full Code [id number]  Pegge Toribio JINNY DEVONNA Inpatient   04/28/2024 2016 05/16/2024 1523 Full Code [id number]  Arthea Child, MD Inpatient   03/31/2018 2330 04/02/2018 1721 Full Code [id number]  Alfornia Madison, MD Inpatient  12/02/2014 2154 12/07/2014 0901 Full Code [id number]  Cathryne Sherrell BRAVO, NP Inpatient   12/02/2014 1731 12/02/2014 2154 Full Code [id number]  Vicky Lamar [facility]       Prognosis:  Unable to determine  Discharge Planning: CIR   Care plan was discussed with primary RN, patient, patient's family, Dr. Briana, Dr. Clayton  Thank you for allowing the Palliative  Medicine Team to assist in the care of this patient.  Billing based on MDM: High  Problems Addressed: One acute or chronic illness or injury that poses a threat to life or bodily function  Amount and/or Complexity of Data: Category 1:Review of prior external note(s) from each unique source, Review of the result(s) of each unique test, and Assessment requiring an independent historian(s) and Category 3:Discussion of management or test interpretation with external physician/other qualified health care professional/appropriate source (not separately reported)  Risks: Parenteral controlled substances     Taneal Sonntag CHRISTELLA Sharps, NP  Please contact Palliative Medicine Team phone at [phone] for questions and concerns.   *Portions of this note are a verbal dictation therefore any spelling and/or grammatical errors are due to the [employer] One system interpretation.

## 2024-05-27 NOTE — Progress Notes (Signed)
 Physical Therapy Treatment Patient Details Name: Joseph [name] MRN: [id number] DOB: [birth year] Today's Date: 05/27/2024   History of Present Illness Pt is a 43 y.o. male presenting 11/28 from CIR with R shoulder pain; found to have R humeral pathologic fx. S/p IM rod placement 11/29. 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 At CIR 11/24-11/28. PMH: cocaine use disorder, cannibis use disorder.    PT Comments  Pt received up in the recliner chair on arrival, agreeable to session and demonstrating great progress towards acute goals. Pt progressing ambulation this session for ~33' with RPFRW for support, chair follow for safety and grossly min A to maintain balance. Pt fatigues quickly with quality of gait deteriorating with fatigue with pt needing increased cues to maintain posture and appropriate BOS with pt ultimately needing seated rest. Pt performing transfer sit<>stand from low recliner with mod A to power up with cues for use of momentum with fair eccentric control when coming to sit. Pt performing seated LE exercises for increased strength and ROM and verbalizing understanding of competing between therapies. Patient will benefit from intensive inpatient follow-up therapy, >3 hours/day, will continue to follow acutely.     If plan is discharge home, recommend the following: Two people to help with walking and/or transfers;Two people to help with bathing/dressing/bathroom   Can travel by private vehicle        Equipment Recommendations  Other (comment)    Recommendations for Other Services       Precautions / Restrictions Precautions Precautions: Fall;Back;Shoulder Type of Shoulder Precautions: elbow/wrist/hand ROM only, WBAT for transfers per op note Shoulder Interventions:  Shoulder sling/immobilizer Precaution Booklet Issued: Yes (comment) Recall of Precautions/Restrictions: Intact Required Braces or Orthoses: Sling Restrictions Weight Bearing Restrictions Per Provider Order: Yes RUE Weight Bearing Per Provider Order: Weight bearing as tolerated RLE Weight Bearing Per Provider Order: Weight bearing as tolerated LLE Weight Bearing Per Provider Order: Weight bearing as tolerated Other Position/Activity Restrictions: Per Dr. Dozier We will allow weightbearing to tolerance given his lower extremity paraplegia and need for his upper extremities     Mobility  Bed Mobility Overal bed mobility: Needs Assistance             General bed mobility comments: pt up in chair on arrival and at end of session    Transfers Overall transfer level: Needs assistance Equipment used: Right platform walker Transfers: Sit to/from Stand, Bed to chair/wheelchair/BSC Sit to Stand: Mod assist           General transfer comment: mod A to boost to stand from low recliner, cues for use of momentum to rise. pt lightly utilizing RUE to push up, still needing assist to place on platform of RW    Ambulation/Gait Ambulation/Gait assistance: Min assist, +2 safety/equipment, Contact guard assist (chair follow) Gait Distance (Feet): 33 Feet Assistive device: Right platform walker Gait Pattern/deviations: Step-through pattern, Decreased stride length, Decreased dorsiflexion - right, Decreased dorsiflexion - left, Trunk flexed, Narrow base of support Gait velocity: decr     General Gait Details: pt ambulating with low steps with low clearance on R>L. no LOB and no buckling this session. Chair follow provided for safety as pt quick to fatigue with BOS becoming more narrow with distance and ataxic with fatigue. Good proximity to RW, cues for more uprigth posture and forward gaze  Stairs             Wheelchair Mobility     Tilt Bed    Modified Rankin (Stroke  Patients Only)       Balance Overall balance assessment: History of Falls, Needs assistance Sitting-balance support: Feet supported Sitting balance-Leahy Scale: Fair     Standing balance support: Single extremity supported, During functional activity, Reliant on assistive device for balance Standing balance-Leahy Scale: Poor Standing balance comment: reliant on UE support                            Communication Communication Communication: No apparent difficulties  Cognition Arousal: Alert Behavior During Therapy: WFL for tasks assessed/performed, Anxious                             Following commands: Intact      Cueing Cueing Techniques: Verbal cues  Exercises General Exercises - Lower Extremity Long Arc Quad: AROM, Both, 20 reps Hip Flexion/Marching: AROM, Both, 20 reps Toe Raises: AROM, Both, 20 reps Heel Raises: AROM, Both, 20 reps Other Exercises Other Exercises: encouraged pt to compelte above exercises throughout day between therapies and x2 throughout day over weekend when up in chair    General Comments General comments (skin integrity, edema, etc.): VSS on RA      Pertinent Vitals/Pain Pain Assessment Pain Assessment: Faces Faces Pain Scale: Hurts little more Pain Location: R shoulder Pain Descriptors / Indicators: Grimacing, Guarding Pain Intervention(s): Monitored during session, Limited activity within patient's tolerance, Repositioned    Home Living                          Prior Function            PT Goals (current goals can now be found in the care plan section) Acute Rehab PT Goals PT Goal Formulation: With patient Time For Goal Achievement: 06/05/24 Progress towards PT goals: Progressing toward goals    Frequency    Min 3X/week      PT Plan      Co-evaluation              AM-PAC PT 6 Clicks Mobility   Outcome Measure  Help needed turning from your back to your side while in a flat  bed without using bedrails?: A Lot Help needed moving from lying on your back to sitting on the side of a flat bed without using bedrails?: A Lot Help needed moving to and from a bed to a chair (including a wheelchair)?: A Lot Help needed standing up from a chair using your arms (e.g., wheelchair or bedside chair)?: A Lot Help needed to walk in hospital room?: A Lot Help needed climbing 3-5 steps with a railing? : Total 6 Click Score: 11    End of Session Equipment Utilized During Treatment: Other (comment) (RUE sling) Activity Tolerance: Patient tolerated treatment well Patient left: in chair;with call bell/phone within reach Nurse Communication: Mobility status PT Visit Diagnosis: Unsteadiness on feet (R26.81);Other abnormalities of gait and mobility (R26.89);Difficulty in walking, not elsewhere classified (R26.2);Pain Pain - Right/Left: Right Pain - part of body: Shoulder;Arm     Time: 8861-8844 PT Time Calculation (min) (ACUTE ONLY): 17 min  Charges:    $Gait Training: 8-22 mins PT General Charges $$ ACUTE PT VISIT: 1 Visit  Therisa SAUNDERS. PTA Acute Rehabilitation Services Office: [phone]   Therisa CHRISTELLA Boor 05/27/2024, 1:14 PM

## 2024-05-27 NOTE — Progress Notes (Signed)
 PROGRESS NOTE    Joseph [name]  FMW:[id number] DOB: 1980-07-19 DOA: 05/21/2024 PCP: Patient, No Pcp Per   Brief Narrative: Joseph [name] is a 42 y.o. male with a history of asthma, polysubstance abuse, metastatic papillary thyroid  cancer. Patient presented from acute inpatient rehabilitation secondary to right shoulder pain and found to have evidence of a pathologic right proximal humerus fracture. Orthopedic surgery consulted for surgical repair. ENT consulted while admitted and recommended outpatient follow-up. Plan for discharge back to acute inpatient rehabilitation.   Assessment and Plan:  Pathologic right proximal humerus fracture Patient with previously known lucency of right proximal humerus consistent with metastasis. X-ray just prior to this admission significant for a lytic lesion with pathologic fracture of the right proximal humerus with mild displacement. Orthopedic surgery consulted and successfully placed an intramedullary rod on 11/29. -Orthopedic surgery recommendations: weight bearint as tolerated, hand, wrist, elbow only  Metastatic papillary thyroid  cancer Pulmonary nodules and bone lesions concerning for metastatic disease. Recently diagnosed on recent admission. Patient set up to follow-up with medical oncology, Dr. Tina. ENT and endocrinology referral placed on prior admission. ENT consulted. CT neck obtained and without lateral or central cervical lymphadenopathy. ENT plan to discuss at multidisciplinary tumor board on 06/08/24.  History of thoracic spine fracture Pathological in etiology. Patient underwent instrumentation and fusion on prior admission. -Continue pain management  History of pulmonary embolism Recently diagnosed on 11/6. Provoked secondary to metastatic cancer. Patient is managed on Eliquis , which was initially held for surgery and is now resumed. -Continue Eliquis   Paraplegia Neurogenic bladder Patient was admitted from acute  inpatient rehabilitation. Patient  -Continue PT/OT  Chronic normocytic anemia Stable.  Constipation -Continue MiraLAX   Chronic pain Patient's pain medication regimen was managed while in acute inpatient rehab. Patient developed overdose requiring narcan . Analgesics adjusted. Palliative care consulted. Patient started on methadone . -Palliative care recommendations: methadone , Lyrica , oxycodone  as needed, Fentanyl  IV as needed  History of substance abuse Noted. Patient counseled on admission.  Constipation -Restart MiraLAX    DVT prophylaxis: Eliquis  Code Status:   Code Status: Full Code Family Communication: None at bedside Disposition Plan: Discharge likely back to acute inpatient rehabilitation. Appears to be stable for discharge at this time.   Consultants:  Orthopedic surgery Palliative care medicine  Procedures:  Insertion, intramedullary rod, humerus  Antimicrobials: None    Subjective: No specific concerns today. Eagerly awaiting return to rehab.  Objective: BP (!) 151/128   Pulse 96   Temp 98.2 F (36.8 C)   Resp 18   Ht 5' 9 (1.753 m)   Wt (!) 142.9 kg   SpO2 97%   BMI 46.52 kg/m   Examination:  General exam: Appears calm and comfortable. Respiratory system: Clear to auscultation. Respiratory effort normal. Cardiovascular system: S1 & S2 heard, RRR.  Gastrointestinal system: Abdomen is nondistended, soft and nontender. Central nervous system: Alert and oriented. Musculoskeletal: No calf tenderness Psychiatry: Judgement and insight appear normal. Mood & affect appropriate.     Data Reviewed: I have personally reviewed following labs and imaging studies  CBC Lab Results  Component Value Date   WBC 8.0 05/25/2024   RBC 3.23 (L) 05/25/2024   HGB 9.6 (L) 05/25/2024   HCT 29.5 (L) 05/25/2024   MCV 91.3 05/25/2024   MCH 29.7 05/25/2024   PLT PLATELET CLUMPS NOTED ON SMEAR, UNABLE TO ESTIMATE 05/25/2024   MCHC 32.5 05/25/2024   RDW 13.4  05/25/2024   LYMPHSABS 1.5 05/25/2024   MONOABS 0.7 05/25/2024   EOSABS  0.3 05/25/2024   BASOSABS 0.0 05/25/2024     Last metabolic panel Lab Results  Component Value Date   NA 139 05/25/2024   K 4.0 05/25/2024   CL 100 05/25/2024   CO2 27 05/25/2024   BUN 9 05/25/2024   CREATININE 0.67 05/25/2024   GLUCOSE 114 (H) 05/25/2024   GFRNONAA >60 05/25/2024   GFRAA >60 08/16/2019   CALCIUM 8.7 (L) 05/25/2024   PHOS 4.8 (H) 05/25/2024   PROT 6.4 (L) 05/25/2024   ALBUMIN  2.3 (L) 05/25/2024   BILITOT 0.4 05/25/2024   ALKPHOS 95 05/25/2024   AST 19 05/25/2024   ALT 36 05/25/2024   ANIONGAP 12 05/25/2024    GFR: Estimated Creatinine Clearance: 167.7 mL/min (by C-G formula based on SCr of 0.67 mg/dL).  Recent Results (from the past 240 hours)  Culture, blood (Routine X 2) w Reflex to ID Panel     Status: None   Collection Time: 05/21/24 10:58 PM   Specimen: BLOOD  Result Value Ref Range Status   Specimen Description BLOOD BLOOD LEFT HAND  Final   Special Requests   Final    BOTTLES DRAWN AEROBIC AND ANAEROBIC Blood Culture results may not be optimal due to an inadequate volume of blood received in culture bottles   Culture   Final    NO GROWTH 5 DAYS Performed at [facility], [address], [place], [place] [address]    Report Status 05/26/2024 FINAL  Final  Culture, blood (Routine X 2) w Reflex to ID Panel     Status: None   Collection Time: 05/21/24 10:58 PM   Specimen: BLOOD  Result Value Ref Range Status   Specimen Description BLOOD BLOOD LEFT ARM  Final   Special Requests   Final    BOTTLES DRAWN AEROBIC ONLY Blood Culture results may not be optimal due to an inadequate volume of blood received in culture bottles   Culture   Final    NO GROWTH 5 DAYS Performed at [facility], [address], [place], [place] [address]    Report Status 05/26/2024 FINAL  Final      Radiology Studies: CT SOFT TISSUE NECK W CONTRAST Result Date: 05/26/2024 EXAM: CT  NECK WITH CONTRAST 05/25/2024 03:14:00 PM TECHNIQUE: CT of the neck was performed with the administration of 75 mL of iohexol  (OMNIPAQUE ) 350 MG/ML injection. Multiplanar reformatted images are provided for review. Automated exposure control, iterative reconstruction, and/or weight based adjustment of the mA/kV was utilized to reduce the radiation dose to as low as reasonably achievable. COMPARISON: None available. CLINICAL HISTORY: Thyroid  cancer. FINDINGS: AERODIGESTIVE TRACT: No discrete mass. No edema. SALIVARY GLANDS: The parotid and submandibular glands are unremarkable. THYROID : Markedly enlarged left lobe of the thyroid  gland, which has been previously sampled by FNA. LYMPH NODES: No suspicious cervical lymphadenopathy. SOFT TISSUES: No mass or fluid collection. BRAIN, ORBITS, SINUSES AND MASTOIDS: No acute abnormality. LUNGS AND MEDIASTINUM: No acute abnormality. BONES: Posterior fusion hardware of the upper thoracic spine. There is a lucent lesion within the C6 vertebra replacing most of the normal bone marrow. Smaller lesion noted within the dorsal C5 vertebral body. No current evidence of pathologic fracture. IMPRESSION: 1. Markedly enlarged left thyroid  lobe, previously sampled by FNA. 2. C5 and C6 vertebral body metastases without pathologic fracture. 3. No cervical lymphadenopathy. Electronically signed by: Franky Stanford MD 05/26/2024 12:37 AM EST RP Workstation: [id number]       LOS: 6 days    [name], MD  [facility] 05/27/2024, 12:22 PM   If 7PM-7AM, please contact night-coverage [email or web address]

## 2024-05-27 NOTE — Plan of Care (Signed)
  Problem: Education: Goal: Knowledge of General Education information will improve Description Including pain rating scale, medication(s)/side effects and non-pharmacologic comfort measures Outcome: Progressing   Problem: Health Behavior/Discharge Planning: Goal: Ability to manage health-related needs will improve Outcome: Progressing   Problem: Clinical Measurements: Goal: Will remain free from infection Outcome: Progressing   Problem: Nutrition: Goal: Adequate nutrition will be maintained Outcome: Progressing   Problem: Coping: Goal: Level of anxiety will decrease Outcome: Progressing   

## 2024-05-28 DIAGNOSIS — M84521A Pathological fracture in neoplastic disease, right humerus, initial encounter for fracture: Secondary | ICD-10-CM | POA: Diagnosis not present

## 2024-05-28 NOTE — Progress Notes (Signed)
 PROGRESS NOTE    [name]  FMW:[id number] DOB: 01/06/1981 DOA: 05/21/2024 PCP: Patient, No Pcp Per   Brief Narrative: Joseph [name] is a 43 y.o. male with a history of asthma, polysubstance abuse, metastatic papillary thyroid  cancer. Patient presented from acute inpatient rehabilitation secondary to right shoulder pain and found to have evidence of a pathologic right proximal humerus fracture. Orthopedic surgery consulted for surgical repair. ENT consulted while admitted and recommended outpatient follow-up. Plan for discharge back to acute inpatient rehabilitation.   Assessment and Plan:  Pathologic right proximal humerus fracture Patient with previously known lucency of right proximal humerus consistent with metastasis. X-ray just prior to this admission significant for a lytic lesion with pathologic fracture of the right proximal humerus with mild displacement. Orthopedic surgery consulted and successfully placed an intramedullary rod on 11/29. -Orthopedic surgery recommendations: weight bearing as tolerated, hand, wrist, elbow only  Metastatic papillary thyroid  cancer Pulmonary nodules and bone lesions concerning for metastatic disease. Recently diagnosed on recent admission. Patient set up to follow-up with medical oncology, Dr. Tina. ENT and endocrinology referral placed on prior admission. ENT consulted. CT neck obtained and without lateral or central cervical lymphadenopathy. ENT plan to discuss at multidisciplinary tumor board on 06/08/24.  History of thoracic spine fracture Pathological in etiology. Patient underwent instrumentation and fusion on prior admission. -Continue pain management  History of pulmonary embolism Recently diagnosed on 11/6. Provoked secondary to metastatic cancer. Patient is managed on Eliquis , which was initially held for surgery and is now resumed. -Continue Eliquis   Paraplegia Neurogenic bladder Patient was admitted from acute  inpatient rehabilitation. Patient  -Continue PT/OT  Chronic normocytic anemia Stable.  Constipation -Continue MiraLAX   Chronic pain Patient's pain medication regimen was managed while in acute inpatient rehab. Patient developed overdose requiring narcan . Analgesics adjusted. Palliative care consulted. Patient started on methadone . -Palliative care recommendations: methadone , Lyrica , oxycodone  as needed, dilaudid  IV as needed  History of substance abuse Noted. Patient counseled on admission.  Constipation -Continue MiraLAX    DVT prophylaxis: Eliquis  Code Status:   Code Status: Full Code Family Communication: None at bedside Disposition Plan: Discharge likely back to acute inpatient rehabilitation. Appears to be stable for discharge at this time.   Consultants:  Orthopedic surgery Palliative care medicine  Procedures:  Insertion, intramedullary rod, humerus  Antimicrobials: None    Subjective: No specific concerns this morning.  Objective: BP 116/65 (BP Location: Left Arm)   Pulse (!) 101   Temp 97.7 F (36.5 C) (Oral)   Resp 16   Ht 5' 9 (1.753 m)   Wt (!) 142.9 kg   SpO2 94%   BMI 46.52 kg/m   Examination:  General exam: Appears calm and comfortable. Respiratory system: Clear to auscultation. Respiratory effort normal. Cardiovascular system: S1 & S2 heard, RRR.  Gastrointestinal system: Abdomen is nondistended, soft and nontender. Central nervous system: Alert and oriented. Musculoskeletal: No calf tenderness Psychiatry: Judgement and insight appear normal. Mood & affect appropriate.     Data Reviewed: I have personally reviewed following labs and imaging studies  CBC Lab Results  Component Value Date   WBC 8.0 05/25/2024   RBC 3.23 (L) 05/25/2024   HGB 9.6 (L) 05/25/2024   HCT 29.5 (L) 05/25/2024   MCV 91.3 05/25/2024   MCH 29.7 05/25/2024   PLT PLATELET CLUMPS NOTED ON SMEAR, UNABLE TO ESTIMATE 05/25/2024   MCHC 32.5 05/25/2024   RDW  13.4 05/25/2024   LYMPHSABS 1.5 05/25/2024   MONOABS 0.7 05/25/2024  EOSABS 0.3 05/25/2024   BASOSABS 0.0 05/25/2024     Last metabolic panel Lab Results  Component Value Date   NA 139 05/25/2024   K 4.0 05/25/2024   CL 100 05/25/2024   CO2 27 05/25/2024   BUN 9 05/25/2024   CREATININE 0.67 05/25/2024   GLUCOSE 114 (H) 05/25/2024   GFRNONAA >60 05/25/2024   GFRAA >60 08/16/2019   CALCIUM 8.7 (L) 05/25/2024   PHOS 4.8 (H) 05/25/2024   PROT 6.4 (L) 05/25/2024   ALBUMIN  2.3 (L) 05/25/2024   BILITOT 0.4 05/25/2024   ALKPHOS 95 05/25/2024   AST 19 05/25/2024   ALT 36 05/25/2024   ANIONGAP 12 05/25/2024    GFR: Estimated Creatinine Clearance: 167.7 mL/min (by C-G formula based on SCr of 0.67 mg/dL).  Recent Results (from the past 240 hours)  Culture, blood (Routine X 2) w Reflex to ID Panel     Status: None   Collection Time: 05/21/24 10:58 PM   Specimen: BLOOD  Result Value Ref Range Status   Specimen Description BLOOD BLOOD LEFT HAND  Final   Special Requests   Final    BOTTLES DRAWN AEROBIC AND ANAEROBIC Blood Culture results may not be optimal due to an inadequate volume of blood received in culture bottles   Culture   Final    NO GROWTH 5 DAYS Performed at [facility], [address], [place], [place] [address]    Report Status 05/26/2024 FINAL  Final  Culture, blood (Routine X 2) w Reflex to ID Panel     Status: None   Collection Time: 05/21/24 10:58 PM   Specimen: BLOOD  Result Value Ref Range Status   Specimen Description BLOOD BLOOD LEFT ARM  Final   Special Requests   Final    BOTTLES DRAWN AEROBIC ONLY Blood Culture results may not be optimal due to an inadequate volume of blood received in culture bottles   Culture   Final    NO GROWTH 5 DAYS Performed at [facility], [address], [place], [place] [address]    Report Status 05/26/2024 FINAL  Final      Radiology Studies: No results found.      LOS: 7 days    [name],  MD [facility] 05/28/2024, 11:21 AM   If 7PM-7AM, please contact night-coverage [email or web address]

## 2024-05-28 NOTE — Plan of Care (Signed)
  Problem: Health Behavior/Discharge Planning: Goal: Ability to manage health-related needs will improve Outcome: Progressing   Problem: Clinical Measurements: Goal: Cardiovascular complication will be avoided Outcome: Progressing   Problem: Nutrition: Goal: Adequate nutrition will be maintained Outcome: Progressing   

## 2024-05-28 NOTE — Progress Notes (Addendum)
 Daily Progress Note   Patient Name: Joseph [name]       Date: 05/28/2024 DOB: 1980/08/05  Age: 43 y.o. MRN#: [id number] Attending Physician: Briana Elgin LABOR, MD Primary Care Physician: Patient, No Pcp Per Admit Date: 05/21/2024  Reason for Consultation/Follow-up: Establishing goals of care and Pain control  Subjective: I have reviewed medical records including: EPIC notes: Hospitalist, OT/PT, otolaryngology.  Patient's case to be discussed during tumor board on 12/17 [facility]: Receiving for pain - Flexeril  3 times daily, lidocaine  patch, Robaxin  4 times daily, Lyrica  3 times daily, methadone  every 8 hours, 0.5-1 mg IV Dilaudid  as needed, 30 mg oxycodone  as needed.  Methadone  7.5 mg started 12/4 at 1300, which was an increase from 5 mg.  Bowel regimen includes MiraLAX  daily and Senokot twice daily.  As needed medications administered in the last 24 hours: Dilaudid  x 2, oxycodone  x 3 Available advanced directives in ACP: none Vital signs stable Labs: Creatinine 0.67 assessed for opioid prescribing, hemoglobin 9.6, albumin  2.3 assessed for overall health, nutritional status, and disease severity, helping predict prognosis and guide care Tests: EKG to assess QTc after methadone  increment has not been performed. Test was ordered for 12/5  Received report from primary RN - no acute concerns.  Requested RN obtain EKG as soon as possible and expressed urgency for timely completion.   Went to visit patient at bedside-no family/visitors present.  Patient is lying in bed asleep-he wakes easily to gentle voice.No signs or non-verbal gestures of pain or discomfort noted. No respiratory distress, increased work of breathing, or secretions noted.   Emotional support provided to patient.  Patient reports feeling  much better.  When asked about his pain he tells me it is actually not that bad.  Current pain score is 5/10.  We discussed that appropriate goals are not for no pain in context of recent surgery; however, ensuring pain is managed and tolerable.  Patient reports his pain is tolerable at this time and he is appreciative.  Will need to continue to work on pain management off IV medications but will make no changes today to allow a full 24 hours prior to any further adjusting.   Patient reports he almost had a BM yesterday - last BM 3 days ago. Goal is BM by tomorrow.   Lemon lime soda provided per  his request.  10:33 AM EKG reviewed - QT 348. Within adequate range to continue current methadone  dose.   Length of Stay: 7  Current Medications: Scheduled Meds:   apixaban   5 mg Oral BID   cyclobenzaprine   10 mg Oral TID   guaiFENesin   1,200 mg Oral BID   iron  polysaccharides  150 mg Oral Daily   lidocaine   1 patch Transdermal Daily   methadone   7.5 mg Oral Q8H   methocarbamol   750 mg Oral TID AC & HS   multivitamin with minerals  1 tablet Oral Daily   polyethylene glycol  17 g Oral Daily   pregabalin   50 mg Oral TID   Ensure Max Protein  11 oz Oral BID   senna-docusate  2 tablet Oral BID    Continuous Infusions:   PRN Meds: acetaminophen  **OR** acetaminophen , albuterol , bisacodyl , HYDROmorphone  (DILAUDID ) injection, ondansetron  **OR** ondansetron  (ZOFRAN ) IV, oxyCODONE , sodium phosphate   Physical Exam Vitals and nursing note reviewed.  Constitutional:      General: He is not in acute distress. Pulmonary:     Effort: No respiratory distress.  Skin:    General: Skin is warm and dry.  Neurological:     Mental Status: He is alert and oriented to person, place, and time.     Motor: Weakness present.  Psychiatric:        Attention and Perception: Attention normal.        Behavior: Behavior is cooperative.        Cognition and Memory: Cognition and memory normal.              Vital Signs: BP 116/65 (BP Location: Left Arm)   Pulse (!) 101   Temp 97.7 F (36.5 C) (Oral)   Resp 16   Ht 5' 9 (1.753 m)   Wt (!) 142.9 kg   SpO2 94%   BMI 46.52 kg/m  SpO2: SpO2: 94 % O2 Device: O2 Device: Room Air O2 Flow Rate: O2 Flow Rate (L/min): 1 L/min  Intake/output summary:  Intake/Output Summary (Last 24 hours) at 05/28/2024 0841 Last data filed at 05/28/2024 9294 Gross per 24 hour  Intake 960 ml  Output 1550 ml  Net -590 ml   LBM: Last BM Date : 05/24/24 Baseline Weight: Weight: (!) 142.9 kg Most recent weight: Weight: (!) 142.9 kg       Palliative Assessment/Data: PPS 50%      Patient Active Problem List   Diagnosis Date Noted   Malignant neoplasm metastatic to bone (HCC) 05/24/2024   Normocytic anemia 05/21/2024   Pathologic humeral fracture 05/21/2024   Pathological fracture of right humerus 05/20/2024   Incomplete paraplegia (HCC) 05/17/2024   Pathologic fracture of thoracic vertebrae 05/16/2024   Papillary thyroid  carcinoma (HCC) 05/10/2024   Malignant neoplasm metastatic to both lungs (HCC) 05/10/2024   Metastatic cancer to spine (HCC) 05/06/2024   Papillary carcinoma (HCC) 05/06/2024   Thoracic spine fracture (HCC) 05/02/2024   Ataxia 05/01/2024   Right shoulder pain 05/01/2024   Pulmonary HTN (HCC) 04/29/2024   Low back pain 04/29/2024   Pulmonary nodules 04/29/2024   Thyroid  nodule 04/29/2024   Pulmonary embolism (HCC) 04/28/2024   Abscess 03/31/2018   Thrombocytopenia 03/31/2018   Depression 03/31/2018   Abscess of hand 03/31/2018   Abscess of right hand 03/31/2018   GAD (generalized anxiety disorder) 12/04/2014   Alcohol use disorder, severe, dependence (HCC) 12/03/2014   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 male  with past medical history of childhood asthma, polysubstance abuse, and recently diagnosed metastatic papillary thyroid  cancer admitted from CIR on 05/21/2024 with pathologic right proximal humerus fracture.    Patient initially presented to the hospital on 04/27/2024 with back pain and difficulty ambulating. He was found to have pulmonary embolism, pathologic T2 fracture, and large thyroid  nodule. Papillary carcinoma was diagnosed with thyroid  biopsy from 05/04/2024.  He underwent T1-T3 laminectomy and T2 corpectomy for resection of metastatic tumor on 05/05/2024. Patient was seen by PMT for pain management during this admission. He was discharged to inpatient rehabilitation on 05/16/2024.    PMT has been consulted to assist with pain management s/p ORIF of proximal humerus fracture and excessive sedation requiring narcan .  Assessment: Principal Problem:   Pathological fracture of right humerus Active Problems:   Pulmonary embolism (HCC)   Metastatic cancer to spine (HCC)   Papillary thyroid  carcinoma (HCC)   Malignant neoplasm metastatic to both lungs (HCC)   Incomplete paraplegia (HCC)   Normocytic anemia   Pathologic humeral fracture   Malignant neoplasm metastatic to bone [facility])     Recommendations/Plan: Continue current plan of care Continue full code as previously documented Patient's goal is discharge to CIR No changes to pain regimen today but will continue assessments with goal to adequately decrease IV opioid use Continue methadone  7.5 mg every 8 hours for minimum of 3 days prior to any further increases.  Current dose started on 12/4 at 1300 Continue IV dilaudid  0.5-1mg  every 3 hours as needed for breakthrough pain after oral oxycodone  Continue oxycodone  30mg  every 4 hours as needed Referral to outpatient Palliative Medicine clinic at [facility] previously placed Patient would benefit  from PMT consult in CIR for possible titration of methadone  if new dose remains suboptimal  PMT will continue to follow and support holistically   Goals of Care and Additional Recommendations: Limitations on Scope of Treatment: Full Scope Treatment  Code Status:    Code Status Orders  (From admission, onward)           Start     Ordered   05/21/24 0130  Full code  Continuous       Question:  By:  Answer:  Consent: discussion documented in EHR   05/21/24 0131           Code Status History     Date Active Date Inactive Code Status Order ID Comments User Context   05/16/2024 1529 05/21/2024 0023 Full Code [id number]  Pegge Toribio PARAS, PA-C Inpatient   05/16/2024 1529 05/16/2024 1529 Full Code [id number]  Pegge Toribio PARAS, PA-C Inpatient   04/28/2024 2016 05/16/2024 1523 Full Code [id number]  Arthea Child, MD Inpatient   03/31/2018 2330 04/02/2018 1721 Full Code [id number]  Alfornia Madison, MD Inpatient   12/02/2014 2154 12/07/2014 0901 Full Code [id number]  Cathryne Sherrell BRAVO, NP Inpatient   12/02/2014 1731 12/02/2014 2154 Full Code [id number]  Vicky Lamar [facility]       Prognosis:  Unable to determine  Discharge Planning: CIR   Care plan was discussed with primary RN, patient  Thank you for allowing the Palliative Medicine Team to assist in the care of this patient.  Billing based on MDM: High  Problems Addressed: One acute or chronic illness or injury  that poses a threat to life or bodily function  Amount and/or Complexity of Data: Category 1:Review of prior external note(s) from each unique source, Review of the result(s) of each unique test, and Assessment requiring an independent historian(s) and Category 3:Discussion of management or test interpretation with external physician/other qualified health care professional/appropriate source (not separately reported)  Risks: Parenteral controlled substances     Rozanne Heumann CHRISTELLA Sharps, NP  Please contact Palliative  Medicine Team phone at [phone] for questions and concerns.   *Portions of this note are a verbal dictation therefore any spelling and/or grammatical errors are due to the Dragon [employer] One system interpretation.

## 2024-05-29 DIAGNOSIS — M84521A Pathological fracture in neoplastic disease, right humerus, initial encounter for fracture: Secondary | ICD-10-CM | POA: Diagnosis not present

## 2024-05-29 MED ORDER — METHADONE HCL 10 MG PO TABS
10.0000 mg | ORAL_TABLET | Freq: Three times a day (TID) | ORAL | Status: DC
  Administered 2024-05-29 – 2024-06-02 (×12): 10 mg via ORAL
  Filled 2024-05-29 (×12): qty 1

## 2024-05-29 MED ORDER — POLYETHYLENE GLYCOL 3350 17 G PO PACK
17.0000 g | PACK | Freq: Two times a day (BID) | ORAL | Status: DC
  Filled 2024-05-29: qty 1

## 2024-05-29 MED ORDER — OXYCODONE HCL 5 MG PO TABS
30.0000 mg | ORAL_TABLET | ORAL | Status: DC | PRN
  Administered 2024-05-29 – 2024-06-02 (×18): 30 mg via ORAL
  Filled 2024-05-29 (×18): qty 6

## 2024-05-29 MED ORDER — METHADONE HCL 5 MG PO TABS
7.5000 mg | ORAL_TABLET | Freq: Three times a day (TID) | ORAL | Status: AC
  Administered 2024-05-29: 7.5 mg via ORAL
  Filled 2024-05-29: qty 2

## 2024-05-29 MED ORDER — HYDROMORPHONE HCL 1 MG/ML IJ SOLN
0.5000 mg | INTRAMUSCULAR | Status: AC | PRN
  Administered 2024-05-29 – 2024-06-02 (×12): 1 mg via INTRAVENOUS
  Filled 2024-05-29 (×12): qty 1

## 2024-05-29 NOTE — Progress Notes (Signed)
 PROGRESS NOTE    [name]  FMW:[id number] DOB: 1981/02/17 DOA: 05/21/2024 PCP: Patient, No Pcp Per   Brief Narrative: Joseph [name] is a 43 y.o. male with a history of asthma, polysubstance abuse, metastatic papillary thyroid  cancer. Patient presented from acute inpatient rehabilitation secondary to right shoulder pain and found to have evidence of a pathologic right proximal humerus fracture. Orthopedic surgery consulted for surgical repair. ENT consulted while admitted and recommended outpatient follow-up. Plan for discharge back to acute inpatient rehabilitation.   Assessment and Plan:  Pathologic right proximal humerus fracture Patient with previously known lucency of right proximal humerus consistent with metastasis. X-ray just prior to this admission significant for a lytic lesion with pathologic fracture of the right proximal humerus with mild displacement. Orthopedic surgery consulted and successfully placed an intramedullary rod on 11/29. -Orthopedic surgery recommendations: weight bearing as tolerated, hand, wrist, elbow only  Metastatic papillary thyroid  cancer Pulmonary nodules and bone lesions concerning for metastatic disease. Recently diagnosed on recent admission. Patient set up to follow-up with medical oncology, Dr. Tina. ENT and endocrinology referral placed on prior admission. ENT consulted. CT neck obtained and without lateral or central cervical lymphadenopathy. ENT plan to discuss at multidisciplinary tumor board on 06/08/24.  History of thoracic spine fracture Pathological in etiology. Patient underwent instrumentation and fusion on prior admission. -Continue pain management  History of pulmonary embolism Recently diagnosed on 11/6. Provoked secondary to metastatic cancer. Patient is managed on Eliquis , which was initially held for surgery and is now resumed. -Continue Eliquis   Paraplegia Neurogenic bladder Patient was admitted from acute  inpatient rehabilitation. Patient  -Continue PT/OT  Chronic normocytic anemia Stable.  Constipation -Continue MiraLAX   Chronic pain Patient's pain medication regimen was managed while in acute inpatient rehab. Patient developed overdose requiring narcan . Analgesics adjusted. Palliative care consulted. Patient started on methadone . -Palliative care recommendations: methadone , Lyrica , oxycodone  as needed, dilaudid  IV as needed  History of substance abuse Noted. Patient counseled on admission.  Constipation -Continue MiraLAX    DVT prophylaxis: Eliquis  Code Status:   Code Status: Full Code Family Communication: None at bedside Disposition Plan: Discharge likely back to acute inpatient rehabilitation. Appears to be stable for discharge at this time.   Consultants:  Orthopedic surgery Palliative care medicine  Procedures:  Insertion, intramedullary rod, humerus  Antimicrobials: None    Subjective: Pain management continues to improve.  Objective: BP 122/77 (BP Location: Right Arm)   Pulse 97   Temp 98.6 F (37 C) (Oral)   Resp 17   Ht 5' 9 (1.753 m)   Wt (!) 142.9 kg   SpO2 94%   BMI 46.52 kg/m   Examination:  General exam: Appears calm and comfortable. Respiratory system: Clear to auscultation. Respiratory effort normal. Cardiovascular system: S1 & S2 heard, Normal rate with regular rhythm. Gastrointestinal system: Abdomen is distended, soft and nontender. Normal bowel sounds heard. Central nervous system: Alert and oriented. No focal neurological deficits. Psychiatry: Judgement and insight appear normal. Mood & affect appropriate.    Data Reviewed: I have personally reviewed following labs and imaging studies  CBC Lab Results  Component Value Date   WBC 8.0 05/25/2024   RBC 3.23 (L) 05/25/2024   HGB 9.6 (L) 05/25/2024   HCT 29.5 (L) 05/25/2024   MCV 91.3 05/25/2024   MCH 29.7 05/25/2024   PLT PLATELET CLUMPS NOTED ON SMEAR, UNABLE TO ESTIMATE  05/25/2024   MCHC 32.5 05/25/2024   RDW 13.4 05/25/2024   LYMPHSABS 1.5 05/25/2024  MONOABS 0.7 05/25/2024   EOSABS 0.3 05/25/2024   BASOSABS 0.0 05/25/2024     Last metabolic panel Lab Results  Component Value Date   NA 139 05/25/2024   K 4.0 05/25/2024   CL 100 05/25/2024   CO2 27 05/25/2024   BUN 9 05/25/2024   CREATININE 0.67 05/25/2024   GLUCOSE 114 (H) 05/25/2024   GFRNONAA >60 05/25/2024   GFRAA >60 08/16/2019   CALCIUM 8.7 (L) 05/25/2024   PHOS 4.8 (H) 05/25/2024   PROT 6.4 (L) 05/25/2024   ALBUMIN  2.3 (L) 05/25/2024   BILITOT 0.4 05/25/2024   ALKPHOS 95 05/25/2024   AST 19 05/25/2024   ALT 36 05/25/2024   ANIONGAP 12 05/25/2024    GFR: Estimated Creatinine Clearance: 167.7 mL/min (by C-G formula based on SCr of 0.67 mg/dL).  Recent Results (from the past 240 hours)  Culture, blood (Routine X 2) w Reflex to ID Panel     Status: None   Collection Time: 05/21/24 10:58 PM   Specimen: BLOOD  Result Value Ref Range Status   Specimen Description BLOOD BLOOD LEFT HAND  Final   Special Requests   Final    BOTTLES DRAWN AEROBIC AND ANAEROBIC Blood Culture results may not be optimal due to an inadequate volume of blood received in culture bottles   Culture   Final    NO GROWTH 5 DAYS Performed at [facility], [address] [place], [place] [address]    Report Status 05/26/2024 FINAL  Final  Culture, blood (Routine X 2) w Reflex to ID Panel     Status: None   Collection Time: 05/21/24 10:58 PM   Specimen: BLOOD  Result Value Ref Range Status   Specimen Description BLOOD BLOOD LEFT ARM  Final   Special Requests   Final    BOTTLES DRAWN AEROBIC ONLY Blood Culture results may not be optimal due to an inadequate volume of blood received in culture bottles   Culture   Final    NO GROWTH 5 DAYS Performed at [facility], [address], [place], [place] [address]    Report Status 05/26/2024 FINAL  Final      Radiology Studies: No results  found.      LOS: 8 days    Joseph Lam, MD [facility] 05/29/2024, 2:02 PM   If 7PM-7AM, please contact night-coverage [email or web address]

## 2024-05-29 NOTE — Progress Notes (Addendum)
 Daily Progress Note   Patient Name: Joseph [name]       Date: 05/29/2024 DOB: 1981/05/26  Age: 43 y.o. MRN#: [id number] Attending Physician: Briana Elgin LABOR, MD Primary Care Physician: Patient, No Pcp Per Admit Date: 05/21/2024  Reason for Consultation/Follow-up: Establishing goals of care and Pain control  Subjective: I have reviewed medical records including: EPIC notes: Hospitalist, nursing. Patient's case to be discussed during tumor board on 12/17 Vital signs stable MAR: Receiving for pain - Flexeril  3 times daily, lidocaine  patch (declined x 2 days), Robaxin  4 times daily, Lyrica  3 times daily, methadone  every 8 hours, 0.5-1 mg IV Dilaudid  as needed, 30 mg oxycodone  as needed.  Methadone  7.5 mg started 12/4 at 1300, which was an increase from 5 mg.  Bowel regimen includes MiraLAX  daily and Senokot twice daily.  As needed medications administered in the last 24 hours: Dilaudid  x 2, oxycodone  x 4 Available advanced directives in ACP: none Labs: Creatinine 0.67 assessed for opioid prescribing, hemoglobin 9.6, albumin  2.3 assessed for overall health, nutritional status, and disease severity, helping predict prognosis and guide care Tests: EKG 12/6 shows QT 348 Intake/output: Last BM 12/6  Received report from primary RN -no acute concerns.  Went to visit patient at bedside-no family/visitors present.  Patient is lying in bed asleep-he is easily arousable to knocking and gentle voice. Signs of discomfort noted to include grimacing. No respiratory distress, increased work of breathing, or secretions noted.  Patient endorses 7-8/10 pain in his shoulder.  Patient tells me he has been trying not to use IV opioids, but at times he feels it is needed.  Patient indicates oxycodone  is working;  however, it does not always last long enough to get him through to next dose.  Reviewed symptom management plan to include increasing methadone  starting tonight (after full 72 hours), adjusting oxycodone  timing to every 3 hours as needed instead of every 4 hours as needed, and continuing IV Dilaudid  for breakthrough pain after oxycodone .  Patient expresses appreciation and is agreeable.  Requested RN provide dose of oxycodone .  Patient continues to confirm goal is to CIR.   Length of Stay: 8  Current Medications: Scheduled Meds:   apixaban   5 mg Oral BID   cyclobenzaprine   10 mg Oral TID   guaiFENesin   1,200 mg Oral BID  iron  polysaccharides  150 mg Oral Daily   lidocaine   1 patch Transdermal Daily   methadone   7.5 mg Oral Q8H   methocarbamol   750 mg Oral TID AC & HS   multivitamin with minerals  1 tablet Oral Daily   polyethylene glycol  17 g Oral Daily   pregabalin   50 mg Oral TID   Ensure Max Protein  11 oz Oral BID   senna-docusate  2 tablet Oral BID    Continuous Infusions:   PRN Meds: acetaminophen  **OR** acetaminophen , albuterol , bisacodyl , HYDROmorphone  (DILAUDID ) injection, ondansetron  **OR** ondansetron  (ZOFRAN ) IV, oxyCODONE , sodium phosphate   Physical Exam Vitals and nursing note reviewed.  Constitutional:      General: He is not in acute distress. Pulmonary:     Effort: No respiratory distress.  Skin:    General: Skin is warm and dry.  Neurological:     Mental Status: He is alert and oriented to person, place, and time.     Motor: Weakness present.  Psychiatric:        Attention and Perception: Attention normal.        Behavior: Behavior is cooperative.        Cognition and Memory: Cognition and memory normal.             Vital Signs: BP 122/77 (BP Location: Right Arm)   Pulse 97   Temp 98.6 F (37 C) (Oral)   Resp 17   Ht 5' 9 (1.753 m)   Wt (!) 142.9 kg   SpO2 94%   BMI 46.52 kg/m  SpO2: SpO2: 94 % O2 Device: O2 Device: Room Air O2 Flow  Rate: O2 Flow Rate (L/min): 1 L/min  Intake/output summary:  Intake/Output Summary (Last 24 hours) at 05/29/2024 0831 Last data filed at 05/28/2024 1700 Gross per 24 hour  Intake 360 ml  Output --  Net 360 ml   LBM: Last BM Date : 05/24/24 Baseline Weight: Weight: (!) 142.9 kg Most recent weight: Weight: (!) 142.9 kg       Palliative Assessment/Data: PPS 50%      Patient Active Problem List   Diagnosis Date Noted   Malignant neoplasm metastatic to bone (HCC) 05/24/2024   Normocytic anemia 05/21/2024   Pathologic humeral fracture 05/21/2024   Pathological fracture of right humerus 05/20/2024   Incomplete paraplegia (HCC) 05/17/2024   Pathologic fracture of thoracic vertebrae 05/16/2024   Papillary thyroid  carcinoma (HCC) 05/10/2024   Malignant neoplasm metastatic to both lungs (HCC) 05/10/2024   Metastatic cancer to spine (HCC) 05/06/2024   Papillary carcinoma (HCC) 05/06/2024   Thoracic spine fracture (HCC) 05/02/2024   Ataxia 05/01/2024   Right shoulder pain 05/01/2024   Pulmonary HTN (HCC) 04/29/2024   Low back pain 04/29/2024   Pulmonary nodules 04/29/2024   Thyroid  nodule 04/29/2024   Pulmonary embolism (HCC) 04/28/2024   Abscess 03/31/2018   Thrombocytopenia 03/31/2018   Depression 03/31/2018   Abscess of hand 03/31/2018   Abscess of right hand 03/31/2018   GAD (generalized anxiety disorder) 12/04/2014   Alcohol use disorder, severe, dependence (HCC) 12/03/2014   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 male  with past medical history of childhood asthma, polysubstance abuse, and recently diagnosed metastatic papillary thyroid  cancer  admitted from CIR on  05/21/2024 with pathologic right proximal humerus fracture.    Patient initially presented to the hospital on 04/27/2024 with back pain and difficulty ambulating. He was found to have pulmonary embolism, pathologic T2 fracture, and large thyroid  nodule. Papillary carcinoma was diagnosed with thyroid  biopsy from 05/04/2024.  He underwent T1-T3 laminectomy and T2 corpectomy for resection of metastatic tumor on 05/05/2024. Patient was seen by PMT for pain management during this admission. He was discharged to inpatient rehabilitation on 05/16/2024.    PMT has been consulted to assist with pain management s/p ORIF of proximal humerus fracture and excessive sedation requiring narcan .  Assessment: Principal Problem:   Pathological fracture of right humerus Active Problems:   Pulmonary embolism (HCC)   Metastatic cancer to spine (HCC)   Papillary thyroid  carcinoma (HCC)   Malignant neoplasm metastatic to both lungs (HCC)   Incomplete paraplegia (HCC)   Normocytic anemia   Pathologic humeral fracture   Malignant neoplasm metastatic to bone [facility])     Recommendations/Plan: Continue current plan of care Continue full code as previously documented Patient's goal is for discharge to CIR Pain regimen adjusted with goal to decrease IV opioid use: Methadone  scheduled to increase tonight at 2200 from 7.5mg  to 10mg  Oxycodone  adjusted from every 4 hours as needed to every 3 hours as needed Dilaudid  adjusted from every 3 hours as needed to every 4 hours as needed EKG ordered at 24 hour and 48 hour intervals to monitor QTc after methadone  increase  Referral to outpatient Palliative Medicine clinic at [facility] previously placed Patient would benefit from PMT consult in CIR for possible titration of methadone  if new dose remains suboptimal  PMT will continue to follow and support holistically   Goals of Care and Additional Recommendations: Limitations on Scope of Treatment: Full Scope Treatment  Code  Status:    Code Status Orders  (From admission, onward)           Start     Ordered   05/21/24 0130  Full code  Continuous       Question:  By:  Answer:  Consent: discussion documented in EHR   05/21/24 0131           Code Status History     Date Active Date Inactive Code Status Order ID Comments User Context   05/16/2024 1529 05/21/2024 0023 Full Code [id number]  Pegge Toribio PARAS, PA-C Inpatient   05/16/2024 1529 05/16/2024 1529 Full Code [id number]  Pegge Toribio PARAS, PA-C Inpatient   04/28/2024 2016 05/16/2024 1523 Full Code [id number]  Arthea Child, MD Inpatient   03/31/2018 2330 04/02/2018 1721 Full Code [id number]  Alfornia Madison, MD Inpatient   12/02/2014 2154 12/07/2014 0901 Full Code [id number]  Cathryne Sherrell BRAVO, NP Inpatient   12/02/2014 1731 12/02/2014 2154 Full Code [id number]  Vicky Lamar [facility]       Prognosis:  Unable to determine  Discharge Planning: CIR   Care plan was discussed with primary RN, patient, Dr. Briana, Dr. Clayton  Thank you for allowing the Palliative Medicine Team to assist in the care of this patient.  Billing based on MDM: High  Problems Addressed: One acute or chronic illness or injury that poses a threat to life or bodily function  Amount and/or Complexity of Data: Category 1:Review of prior external note(s) from each unique source, Review of the result(s) of each unique test, Ordering of each unique test, and Assessment requiring an independent historian(s) and Category 3:Discussion of management or test  interpretation with external physician/other qualified health care professional/appropriate source (not separately reported)  Risks: Parenteral controlled substances     Chevelle Durr CHRISTELLA Sharps, NP  Please contact Palliative Medicine Team phone at [phone] for questions and concerns.   *Portions of this note are a verbal dictation therefore any spelling and/or grammatical errors are due to the Dragon [employer] One system  interpretation.

## 2024-05-29 NOTE — Plan of Care (Signed)
  Problem: Education: Goal: Knowledge of General Education information will improve Description: Including pain rating scale, medication(s)/side effects and non-pharmacologic comfort measures Outcome: Progressing   Problem: Clinical Measurements: Goal: Ability to maintain clinical measurements within normal limits will improve Outcome: Progressing Goal: Will remain free from infection Outcome: Progressing   Problem: Nutrition: Goal: Adequate nutrition will be maintained Outcome: Progressing   Problem: Coping: Goal: Level of anxiety will decrease Outcome: Progressing   

## 2024-05-30 ENCOUNTER — Inpatient Hospital Stay: Payer: MEDICAID

## 2024-05-30 DIAGNOSIS — M84521A Pathological fracture in neoplastic disease, right humerus, initial encounter for fracture: Secondary | ICD-10-CM | POA: Diagnosis not present

## 2024-05-30 DIAGNOSIS — C73 Malignant neoplasm of thyroid gland: Secondary | ICD-10-CM | POA: Insufficient documentation

## 2024-05-30 MED ORDER — POLYETHYLENE GLYCOL 3350 17 G PO PACK
17.0000 g | PACK | Freq: Every day | ORAL | Status: DC
  Administered 2024-06-01 – 2024-06-02 (×2): 17 g via ORAL
  Filled 2024-05-30 (×3): qty 1

## 2024-05-30 NOTE — Progress Notes (Signed)
 Inpatient Rehab Admissions Coordinator:   Pt weaning IV pain medication use significantly over the weekend and continues to mobilize well with therapy.  Plan for radiation simulation/consult tomorrow.  Will start insurance auth request once PT/OT have a chance to update notes.  Will also update pt/family and confirm plan to discharge to aunt's house, as before.   Reche Lowers, PT, DPT Admissions Coordinator [phone] 05/30/24 2:29 PM

## 2024-05-30 NOTE — Progress Notes (Signed)
 Occupational Therapy Treatment Patient Details Name: Joseph [name] MRN: [id number] DOB: December 29, 1980 Today's Date: 05/30/2024   History of present illness Pt is a 43 y.o. male presenting 11/28 from CIR with R shoulder pain; found to have R humeral pathologic fx. S/p IM rod placement 11/29. 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 At CIR 11/24-11/28. PMH: cocaine use disorder, cannibis use disorder.   OT comments  Pt is progressing well towards OT established goals. Focus of session on progressing functional mobility, increasing independence with ADL tasks, and educating Pt on RUE HEP within precautions. Pt required up to Mod A for functional mobility this session, as well as up to Max A for ADL tasks. Pt continues to benefit from skilled OT services in acute care. Continue per POC, current d/c recommendation remains appropriate.       If plan is discharge home, recommend the following:  A lot of help with walking and/or transfers;Two people to help with walking and/or transfers;A lot of help with bathing/dressing/bathroom;Two people to help with bathing/dressing/bathroom   Equipment Recommendations  Other (comment) (defer to next venue)    Recommendations for Other Services Rehab consult    Precautions / Restrictions Precautions Precautions: Fall;Back;Shoulder Type of Shoulder Precautions: elbow/wrist/hand ROM only, WBAT for transfers per op note Shoulder Interventions: Shoulder sling/immobilizer Precaution Booklet Issued: Yes (comment) Recall of Precautions/Restrictions: Intact Precaution/Restrictions Comments: painful R shoulder, present prior to admission but pt states worse now Required Braces or Orthoses: Sling Restrictions Weight Bearing Restrictions Per Provider Order:  Yes RUE Weight Bearing Per Provider Order: Weight bearing as tolerated RLE Weight Bearing Per Provider Order: Weight bearing as tolerated LLE Weight Bearing Per Provider Order: Weight bearing as tolerated Other Position/Activity Restrictions: Per Dr. Dozier We will allow weightbearing to tolerance given his lower extremity paraplegia and need for his upper extremities       Mobility Bed Mobility Overal bed mobility: Needs Assistance Bed Mobility: Supine to Sit, Sit to Supine     Supine to sit: Contact guard, HOB elevated, Used rails Sit to supine: Mod assist   General bed mobility comments: Pt able to come to L EOB with CGA and increased time. Pt required Mod A for BLE management for return to supine and Mod A for repositioning in bed.    Transfers Overall transfer level: Needs assistance Equipment used: Right platform walker Transfers: Sit to/from Stand Sit to Stand: Min assist, From elevated surface           General transfer comment: Pt required Min A to stand from bed slightly elevated with cues for anterior lean and proper hand placement. Pt took three side steps to the left towards [facility] with Min A. Pt with increased fatigue with mobility. Min A to sit at EOB.     Balance Overall balance assessment: History of Falls, Needs assistance Sitting-balance support: Feet supported, No upper extremity supported Sitting balance-Leahy Scale: Fair Sitting balance - Comments: Sit EOB with supervision   Standing balance support: Bilateral upper extremity supported, During functional activity, Reliant on assistive device for balance Standing balance-Leahy Scale: Poor Standing balance comment: Dependent on RW and external support                           ADL either performed or assessed with clinical judgement  ADL Overall ADL's : Needs assistance/impaired     Grooming: Set up;Oral care;Wash/dry face;Sitting           Upper Body Dressing : Maximal  assistance Upper Body Dressing Details (indicate cue type and reason): Max A to doff UB dressing Lower Body Dressing: Maximal assistance;Sitting/lateral leans       Toileting- Clothing Manipulation and Hygiene: Maximal assistance Toileting - Clothing Manipulation Details (indicate cue type and reason): Pt used bedside urinal while supine            Extremity/Trunk Assessment Upper Extremity Assessment Upper Extremity Assessment: Right hand dominant;RUE deficits/detail RUE Deficits / Details: elbow/wrist/hand ROM WFL. RUE Coordination: decreased gross motor            Vision       Perception     Praxis     Communication Communication Communication: No apparent difficulties   Cognition Arousal: Alert Behavior During Therapy: WFL for tasks assessed/performed Cognition: No apparent impairments                               Following commands: Intact        Cueing   Cueing Techniques: Verbal cues, Visual cues  Exercises Exercises: General Upper Extremity, Hand exercises General Exercises - Upper Extremity Elbow Flexion: AROM, Right, 10 reps Elbow Extension: AROM, Right, 10 reps Wrist Flexion: AROM, Right, 10 reps Wrist Extension: AROM, Right, 10 reps Digit Composite Flexion: AROM, Right, 10 reps Hand Exercises Forearm Supination: AROM, Right, 10 reps Forearm Pronation: AROM, Right, 10 reps    Shoulder Instructions       General Comments Pt eager to engage in therapy session    Pertinent Vitals/ Pain       Pain Assessment Pain Assessment: Faces Faces Pain Scale: Hurts a little bit Pain Location: R shoulder Pain Descriptors / Indicators: Discomfort, Guarding Pain Intervention(s): Limited activity within patient's tolerance, Monitored during session, Repositioned  Home Living                                          Prior Functioning/Environment              Frequency  Min 2X/week        Progress Toward  Goals  OT Goals(current goals can now be found in the care plan section)  Progress towards OT goals: Progressing toward goals  Acute Rehab OT Goals Patient Stated Goal: To get back to rehab OT Goal Formulation: With patient Time For Goal Achievement: 06/05/24 Potential to Achieve Goals: Good ADL Goals Pt Will Perform Grooming: with set-up;sitting Pt Will Perform Upper Body Bathing: with min assist;sitting Pt Will Transfer to Toilet: with min assist;with +2 assist;stand pivot transfer Pt Will Perform Toileting - Clothing Manipulation and hygiene: with mod assist;sitting/lateral leans;sit to/from stand Pt/caregiver will Perform Home Exercise Program: Right Upper extremity;With written HEP provided Additional ADL Goal #1: Pt to complete bed mobility with Min A in prep for EOB/OOB ADLs  Plan      Co-evaluation                 AM-PAC OT 6 Clicks Daily Activity     Outcome Measure   Help from another person eating meals?: None Help from another person taking care of personal grooming?: A Little Help from another person toileting, which includes using toliet,  bedpan, or urinal?: Total Help from another person bathing (including washing, rinsing, drying)?: A Lot Help from another person to put on and taking off regular upper body clothing?: A Lot Help from another person to put on and taking off regular lower body clothing?: Total 6 Click Score: 13    End of Session Equipment Utilized During Treatment: Gait belt;Other (comment) (R platform RW)  OT Visit Diagnosis: Unsteadiness on feet (R26.81);Other abnormalities of gait and mobility (R26.89);Muscle weakness (generalized) (M62.81);History of falling (Z91.81);Pain Pain - Right/Left: Right Pain - part of body: Shoulder   Activity Tolerance Patient tolerated treatment well   Patient Left in bed;with call bell/phone within reach   Nurse Communication          Time: 8361-8296 OT Time Calculation (min): 25 min  Charges:  OT General Charges $OT Visit: 1 Visit OT Treatments $Self Care/Home Management : 8-22 mins $Therapeutic Exercise: 8-22 mins  Joseph [name], OTR/L.  [facility] Acute Rehabilitation  Office: [phone]   Joseph [name] 05/30/2024, 5:17 PM

## 2024-05-30 NOTE — Progress Notes (Signed)
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 hemidiaphragm. No focal consolidation, pleural effusion or pneumothorax. The cardiac silhouette is within normal limits. No acute osseous pathology. IMPRESSION: Left-sided PICC with tip at the cavoatrial junction. Electronically Signed   By: Vanetta Chou [name].D.   On: 05/05/2024 21:13   DG Cervical Spine 2 or 3 views Result Date: 05/05/2024 CLINICAL DATA:  Elective surgery. EXAM: CERVICAL SPINE - 2-3 VIEW COMPARISON:  Preoperative imaging. FINDINGS: Six fluoroscopic spot views of the lower cervical and upper thoracic spine submitted from the operating room. Imaging obtained during posterior fusion C7 through T4 with T2 corpectomy. Fluoroscopy time 52.1 seconds. Dose 37.89 mGy. IMPRESSION: Intraoperative fluoroscopy during cervicothoracic surgery. Electronically Signed   By: Andrea Gasman [name].D.   On: 05/05/2024 16:38   DG C-Arm 1-60 Min-No Report Result Date: 05/05/2024 Fluoroscopy was utilized by the requesting physician.  No radiographic interpretation.   DG C-Arm 1-60 Min-No Report Result Date: 05/05/2024 Fluoroscopy was utilized by the requesting physician.  No radiographic interpretation.   DG C-Arm 1-60 Min-No Report Result Date: 05/05/2024 Fluoroscopy was utilized by the requesting physician.  No radiographic interpretation.   DG C-Arm 1-60 Min-No Report Result Date: 05/05/2024 Fluoroscopy was utilized by the requesting physician.  No radiographic interpretation.   DG C-Arm 1-60 Min-No  Report Result Date: 05/05/2024 Fluoroscopy was utilized by the requesting physician.  No radiographic interpretation.   DG C-Arm 1-60 Min-No Report Result Date: 05/05/2024 Fluoroscopy was utilized by the requesting physician.  No radiographic interpretation.   DG O-ARM IMAGE ONLY/NO REPORT Result Date: 05/05/2024 There is no Radiologist interpretation  for this exam.  US  THYROID  FNA EACH NODULE Result Date: 05/04/2024 INDICATION: [id number] Thyroid  mass [id number] Indeterminate thyroid  nodule EXAM: ULTRASOUND GUIDED FINE NEEDLE ASPIRATION OF INDETERMINATE THYROID  NODULE COMPARISON:  U/S thyroid  on 04/29/2024 CT CAP, 05/02/2024. MEDICATIONS: 4 cc of 1% lidocaine  COMPLICATIONS: None immediate. TECHNIQUE: Informed written consent was obtained from the patient after a discussion of the risks, benefits and alternatives to treatment. Questions regarding the procedure were encouraged and answered. A timeout was performed prior  to the initiation of the procedure. Pre-procedural ultrasound scanning demonstrated unchanged size and appearance of the indeterminate nodule within the left lobe of the thyroid . The procedure was planned. The neck was prepped in the usual sterile fashion, and a sterile drape was applied covering the operative field. A timeout was performed prior to the initiation of the procedure. Local anesthesia was provided with 1% lidocaine . Under direct ultrasound guidance, 5 FNA biopsies were performed of the left thyroid  nodule with a 25 gauge needle. Multiple ultrasound images were saved for procedural documentation purposes. The samples were prepared and submitted to pathology. Limited post procedural scanning was negative for hematoma or additional complication. Dressings were placed. The patient tolerated the above procedures procedure well without immediate postprocedural complication. FINDINGS: Nodule reference number based on prior diagnostic ultrasound: 1 Maximum size: 8.2 cm Location: Left;  occupies majority of lobe ACR TI-RADS risk category: TR4 (4-6 points) Reason for biopsy: meets ACR TI-RADS criteria Ultrasound imaging confirms appropriate placement of the needles within the thyroid  nodule. IMPRESSION: Successful ultrasound guided FNA biopsy of a dominant 8.2 cm LEFT TR-4 thyroid  nodule Procedure performed by Carlin Griffon, PA-C under direct supervision of Thom Hall, MD Electronically Signed   By: Thom Hall [name].D.   On: 05/04/2024 17:03   DG CHEST PORT 1 VIEW Result Date: 05/02/2024 CLINICAL DATA:  PICC line placement EXAM: PORTABLE CHEST 1 VIEW COMPARISON:  04/27/2024 FINDINGS: Left-sided PICC line tip: Lower SVC. No pneumothorax or complicating feature. The patient has known pulmonary nodules in both lungs much better shown on prior CT scan. Rightward tracheal deviation due to known left thyroid  enlargement. 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 IMPRESSION: 1. Left-sided PICC line tip: Lower SVC. No pneumothorax or complicating feature. 2. The patient has known pulmonary nodules and a lytic mass of the T2 vertebral body, much better shown on prior CT scan. 3. Rightward tracheal deviation due to known left thyroid  enlargement. Electronically Signed   By: Ryan Salvage [name].D.   On: 05/02/2024 19:33   CT CHEST ABDOMEN PELVIS W CONTRAST Result Date: 05/02/2024 CLINICAL DATA:  Metastatic disease evaluation * Tracking Code: BO * EXAM: CT CHEST, ABDOMEN, AND PELVIS WITH CONTRAST TECHNIQUE: Multidetector CT imaging of the chest, abdomen and pelvis was performed following the standard protocol during bolus administration of intravenous contrast. RADIATION DOSE REDUCTION: This exam was performed according to the departmental dose-optimization program which includes automated exposure control, adjustment of the mA and/or kV according to patient size and/or use of iterative reconstruction technique. CONTRAST:  OMNIPAQUE  IOHEXOL  350 MG/ML  SOLN COMPARISON:  CT chest angiogram, 04/28/2024 FINDINGS: CT CHEST FINDINGS Cardiovascular: Left upper extremity PICC, tip malpositioned, crossing the brachiocephalic confluence and directed into the right internal jugular (series 2, image 7). Normal heart size. No pericardial effusion. Mediastinum/Nodes: No enlarged mediastinal, hilar, or axillary lymph nodes. Small hiatal hernia. Enlarged left lobe of the thyroid , partially imaged, measuring at least 5.1 cm (series 2, image 1). Trachea, and esophagus demonstrate no significant findings. Lungs/Pleura: Numerous small bilateral pulmonary nodules throughout both lungs, at least 30, measuring 0.6 cm and smaller (series 26, image 94). No pleural effusion or pneumothorax. Musculoskeletal: No chest wall abnormality. Unchanged lytic lesion of the T2 vertebral body with an associated soft tissue component, separately reported by same day CT of the thoracic spine (series 2, image 10). CT ABDOMEN PELVIS FINDINGS Hepatobiliary: No solid liver abnormality is seen. Numerous small gallstones. Gallbladder wall  thickening, or biliary dilatation. Pancreas: Unremarkable. No pancreatic ductal dilatation or surrounding inflammatory changes. Spleen: Normal in size without significant abnormality. Adrenals/Urinary Tract: Adrenal glands are unremarkable. Kidneys are normal, without renal calculi, solid lesion, or hydronephrosis. Bladder is unremarkable. Stomach/Bowel: Stomach is within normal limits. Transverse duodenal diverticulum. Appendix appears normal. No evidence of bowel wall thickening, distention, or inflammatory changes. Vascular/Lymphatic: No significant vascular findings are present. No enlarged abdominal or pelvic lymph nodes. Reproductive: No mass or other abnormality. Other: Small fat containing umbilical hernia.  No ascites. Musculoskeletal: No acute osseous findings. IMPRESSION: 1. Numerous small bilateral pulmonary nodules throughout both lungs, at least 30, measuring  0.6 cm and smaller. Findings are highly suspicious for pulmonary metastatic disease given constellation of other findings. 2. 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 3. Enlarged left lobe of the thyroid , partially imaged, measuring at least 5.1 cm, possibly incidental and benign although primary thyroid  malignancy is a differential consideration. This has been previously evaluated by thyroid  ultrasound. 4. No evidence of lymphadenopathy or metastatic disease in the abdomen or pelvis. 5. Left upper extremity PICC, tip malpositioned, crossing the brachiocephalic confluence and directed into the right internal jugular. 6. Cholelithiasis. These results will be called to the ordering clinician or representative by the Radiologist Assistant, and communication documented in the PACS or [employer]. Electronically Signed   By: Marolyn JONETTA Jaksch [name].D.   On: 05/02/2024 16:33   CT CERVICAL SPINE WO CONTRAST Result Date: 05/02/2024 EXAM: CT CERVICAL SPINE AND THORACIC SPINE WITHOUT CONTRAST 05/02/2024 01:14:06 PM TECHNIQUE: CT of the cervical and thoracic was performed without the administration of intravenous contrast. Multiplanar reformatted images are provided for review. Automated exposure control, iterative reconstruction, and/or weight based adjustment of the mA/kV was utilized to reduce the radiation dose to as low as reasonably achievable. COMPARISON: MRI of cervical and thoracic spine dated 05/01/2024. CLINICAL HISTORY: Metastatic disease evaluation. FINDINGS: Evaluation is limited due to photon starvation and motion artifacts. CERVICAL SPINE: BONES AND ALIGNMENT: There is no acute fracture or traumatic malalignment. The vertebral body heights are maintained. There is a lucent lesion within the C6 vertebral body which may represent benign etiology such as hemangioma given its internal stippled  appearance. DEGENERATIVE CHANGES: Evaluation of the spinal canal and its contents are also seen to better advantage on recent MRI, see separately dictated report. SOFT TISSUES: Redemonstrated markedly enlarged left lobe of the thyroid  with rightward tracheal deviation and mild compression. THORACIC SPINE: BONES AND ALIGNMENT: The thoracic kyphosis is overall preserved without significant vertebral column listhesis. There is upper thoracic levocurvature. Redemonstrated soft tissue replacement of the T2 vertebral body that extends into the spinal canal, bilateral neural T1-T2 and T2-T3 neural foramen with associated spinal canal and neural foraminal stenosis seen to better advantage on recent MRI with intravenous contrast 05/01/2024. There is additionally circumferential and anterior soft tissue extension into the paravertebral space. The remaining vertebral body heights are maintained. There are lucent lesions within the T1, T3, and T6 vertebral bodies which may represent benign etiology such as hemangioma, given their internal stippled appearance. DEGENERATIVE CHANGES: Evaluation of the spinal canal and its contents seen to better advantage on recent MRI, see separately dictated report. SOFT TISSUES: Pulmonary nodules, please see separately dictated report for same day CT chest, abdomen, and pelvis with IV contrast 05/02/2024 for further relevant evaluation. INCIDENTAL LUMBAR FINDINGS: Axial images obtained through the lumbar spine demonstrate  a destructive lesion involving primarily the L5 inferior endplate as well as the S1 superior endplate with adjacent sclerosis. IMPRESSION: 1. Since 05/01/2024, redemonstrated destructive soft tissue replacement of the T2 vertebral body with circumferential extension involving the spinal canal, bilateral neural foraminal, and paravertebral spaces, all seen to better advantage on recent MRI. 2. Lucent lesion within the C6 vertebral body is suspicious for metastatic disease. 3.  Aside from T2, no acute cervical or thoracic vertebral fracture or malalignment. 4. Pulmonary nodules, please see separately dictated report for same day CT chest, abdomen, and pelvis with IV contrast 05/02/2024 for further relevant evaluation. Electronically signed by: Joseph bybordi 05/02/2024 04:03 PM EST RP Workstation: [id number]   CT T-SPINE NO CHARGE Result Date: 05/02/2024 EXAM: CT CERVICAL SPINE AND THORACIC SPINE WITHOUT CONTRAST 05/02/2024 01:14:06 PM TECHNIQUE: CT of the cervical and thoracic was performed without the administration of intravenous contrast. Multiplanar reformatted images are provided for review. Automated exposure control, iterative reconstruction, and/or weight based adjustment of the mA/kV was utilized to reduce the radiation dose to as low as reasonably achievable. COMPARISON: MRI of cervical and thoracic spine dated 05/01/2024. CLINICAL HISTORY: Metastatic disease evaluation. FINDINGS: Evaluation is limited due to photon starvation and motion artifacts. CERVICAL SPINE: BONES AND ALIGNMENT: There is no acute fracture or traumatic malalignment. The vertebral body heights are maintained. There is a lucent lesion within the C6 vertebral body which may represent benign etiology such as hemangioma given its internal stippled appearance. DEGENERATIVE CHANGES: Evaluation of the spinal canal and its contents are also seen to better advantage on recent MRI, see separately dictated report. SOFT TISSUES: Redemonstrated markedly enlarged left lobe of the thyroid  with rightward tracheal deviation and mild compression. THORACIC SPINE: BONES AND ALIGNMENT: The thoracic kyphosis is overall preserved without significant vertebral column listhesis. There is upper thoracic levocurvature. Redemonstrated soft tissue replacement of the T2 vertebral body that extends into the spinal canal, bilateral neural T1-T2 and T2-T3 neural foramen with associated spinal canal and neural foraminal stenosis seen to  better advantage on recent MRI with intravenous contrast 05/01/2024. There is additionally circumferential and anterior soft tissue extension into the paravertebral space. The remaining vertebral body heights are maintained. There are lucent lesions within the T1, T3, and T6 vertebral bodies which may represent benign etiology such as hemangioma, given their internal stippled appearance. DEGENERATIVE CHANGES: Evaluation of the spinal canal and its contents seen to better advantage on recent MRI, see separately dictated report. SOFT TISSUES: Pulmonary nodules, please see separately dictated report for same day CT chest, abdomen, and pelvis with IV contrast 05/02/2024 for further relevant evaluation. INCIDENTAL LUMBAR FINDINGS: Axial images obtained through the lumbar spine demonstrate a destructive lesion involving primarily the L5 inferior endplate as well as the S1 superior endplate with adjacent sclerosis. IMPRESSION: 1. Since 05/01/2024, redemonstrated destructive soft tissue replacement of the T2 vertebral body with circumferential extension involving the spinal canal, bilateral neural foraminal, and paravertebral spaces, all seen to better advantage on recent MRI. 2. Lucent lesion within the C6 vertebral body is suspicious for metastatic disease. 3. Aside from T2, no acute cervical or thoracic vertebral fracture or malalignment. 4. Pulmonary nodules, please see separately dictated report for same day CT chest, abdomen, and pelvis with IV contrast 05/02/2024 for further relevant evaluation. Electronically signed by: Joseph spade 05/02/2024 04:03 PM EST RP Workstation: [id number]   US  EKG SITE RITE Result Date: 05/02/2024 If Site Rite image not attached, placement could not be confirmed due to current cardiac rhythm.  MR  THORACIC SPINE W WO CONTRAST Addendum Date: 05/01/2024 ******** ADDENDUM #2 ******** ADDENDUM: Sagittal STIR images of the cervical spine demonstrate subtle hyperintense signal within  the thoracic cord at the T2 level concerning for mild cord edema. Addendum discussed with Tori, RN at 7:05 PM on 05/01/24. ---------------------------------------------------- Electronically signed by: Donnice Mania MD 05/01/2024 08:01 PM EST RP Workstation: [id number]   Addendum Date: 05/01/2024 ******** ADDENDUM #1 ******** ADDENDUM: Sagittal STIR images of the cervical spine demonstrate subtle hyperintense signal within the thoracic cord at the T2 level concerning for mild cord edema. ---------------------------------------------------- Electronically signed by: Donnice Mania MD 05/01/2024 07:00 PM EST RP Workstation: [id number]   Result Date: 05/01/2024 ******** ORIGINAL REPORT ******** EXAM: MRI THORACIC SPINE WITH AND WITHOUT INTRAVENOUS CONTRAST 05/01/2024 04:05:03 PM TECHNIQUE: Multiplanar multisequence MRI of the thoracic spine was performed with and without the administration of intravenous contrast. CONTRAST: 10 mL of Gadavist . COMPARISON: MR Lumbar spine 04/28/2024. CLINICAL HISTORY: History of daily fentanyl  use and right shoulder pain due to football injury, present with cough, lower back pain, unsteady gait, numbness and tingling in the inner thighs going down the legs and difficulty moving lower extremities, reports that back pain started about 2 or 3 weeks ago and that there was no inciting injury, difficulty walking, due to unsteady gait and weakness of bilateral lower extremities began about 5 or 6 days ago, some numbness and tingling in his inner thighs and groin area, sometimes spreading down the backs of his legs, recent respiratory illness with a cough but no other recent symptoms and no recent gastrointestinal illness. FINDINGS: BONES AND ALIGNMENT: There is a STIR hyperintense enhancing lesion noted within the T2 vertebral body associated pathologic fracture with greater than 90% height loss of the T2 vertebra. Centrally there is significant bowing of the anterior and posterior cortices of  the T2 vertebral body with abnormal enhancing soft tissue extending into the anterior prevertebral soft tissues with enhancement extending from the inferior margin of T1 inferiorly to the mid T3 level. There is additional enhancing soft tissue along the ventral epidural soft tissues from T1 to T2. Posterior bowing of the T2 cortex results in severe spinal canal stenosis with cord compression slightly greater on the left. There is no definite signal abnormality in the cord to suggest edema at this time. Thoracic vertebral body heights are otherwise maintained. Slightly exaggerated focal kyphosis at the level of pathologic fracture. Thoracic spine alignment otherwise maintained. Additional signal abnormality in the T2 spinous process which may reflect additional involvement by osseous lesion. There is edema within the posterior aspects of the T3 vertebral body as well as the pedicles and pars interarticularis of T3 slightly more pronounced on the left. There is mild associated enhancement. Findings could reflect additional area of osseous lesion versus reactive changes in the setting of fracture. Hemangioma in the T6 vertebral body. SPINAL CORD: Normal spinal cord volume. Normal spinal cord signal. SOFT TISSUES: Enlargement of the left thyroid  lobe with a heterogeneous nodule demonstrating mild enhancement. The nodule measures at least 4.5 cm in diameter. Recommend correlation with recent thyroid  ultrasound findings. Abnormal enhancement of the soft tissues extends into the left sided neural foramina at T1-T2 and T2-T3 abutting the exiting T1 and T2 nerve roots. Foraminal involvement is more pronounced at the T2-T3 level. DEGENERATIVE CHANGES: No significant disc herniation. No spinal canal stenosis or neural foraminal narrowing. IMPRESSION: 1. Enhancing lesion within the T2 vertebral body with pathologic fracture resulting in severe vertebral body height loss, concerning for metastatic disease.  2. Significant bowing  of the T2 posterior cortex causing severe spinal canal stenosis and cord compression. No definite cord edema. 3. Abnormal enhancement extending into the ventral epidural space at T1T2 and into the left T1T2 and T2T3 neural foramina abutting the exiting T1 and T2 nerve roots, concerning for extraosseous involvement of disease. 4. Edema and mild enhancement in the posterior T3 vertebral body, pedicles, and pars interarticularis, possibly representing additional osseous lesion versus reactive changes in the setting of fracture. 5. Enlarged left thyroid  lobe with a heterogeneous nodule measuring at least 4.5 cm. Recommend management per thyroid  ultrasound report. 6. Impression 1 and 2 discussed with Dr. Jadine at 6:17PM on 05/01/24. Electronically signed by: Donnice Mania MD 05/01/2024 06:51 PM EST RP Workstation: [id number]   MR CERVICAL SPINE W WO CONTRAST Result Date: 05/01/2024 EXAM: MRI CERVICAL SPINE WITH AND WITHOUT CONTRAST 05/01/2024 04:14:35 PM TECHNIQUE: Multiplanar multisequence MRI of the cervical spine was performed without and with the administration of intravenous contrast. CONTRAST: 10 mL of Gadavist . COMPARISON: US  Thyroid  04/29/2024. CLINICAL HISTORY: History of daily fentanyl  use and right shoulder pain due to football injury, present with cough, lower back pain, unsteady gait, numbness and tingling in the inner thighs going down the legs and difficulty moving lower extremities, reports that back pain started about 2 or 3 weeks ago and that there was no inciting injury, difficulty walking, due to unsteady gait and weakness of bilateral lower extremities began about 5 or 6 days ago, some numbness and tingling in his inner thighs and groin area, sometimes spreading down the backs of his legs, recent respiratory illness with a cough but no other recent symptoms and no recent gastrointestinal illness. FINDINGS: BONES AND ALIGNMENT: Straightening of the normal cervical lordosis. No evidence of  traumatic malalignment. Normal vertebral body heights. Marrow signal is unremarkable except for a signal abnormality in the C6 vertebral body favored to reflect a hemangioma. No abnormal enhancement. Additional osseous lesion at the T2 level with associated pathologic fracture and severe spinal canal stenosis which is fully described on the thoracic spine report. Facet arthrosis at multiple levels throughout the cervical spine. SPINAL CORD: The cervical cord is normal in caliber and signal intensity. Visualized inferior aspects of the posterior fossa are unremarkable. Partially visualized portions of the upper thoracic spinal cord demonstrate cord compression at the T2 level. There is subtle signal abnormality in the thoracic cord at the T2 level best appreciated on the STIR images of the cervical spine study which may reflect early cord edema. SOFT TISSUES: No paraspinal mass. A left thyroid  nodule is incompletely visualized, better characterized on recent thyroid  ultrasound. C2-C3: No significant disc herniation. No spinal canal stenosis or neural foraminal narrowing. C3-C4: No significant disc herniation. No spinal canal stenosis or neural foraminal narrowing. C4-C5: No significant disc herniation. No spinal canal stenosis or neural foraminal narrowing. C5-C6: There is a small disc bulge slightly eccentric to the left which indents the ventral thecal sac with subtle flattening the ventral cervical cord without cord signal abnormality. C6-C7: Additional small disc bulge at C6-C7 which indents the ventral thecal sac without contacting the spinal cord. C7-T1: No significant disc herniation. No spinal canal stenosis or neural foraminal narrowing. IMPRESSION: 1. No suspicious osseous lesion in the cervical spine. 2. 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 Subtle thoracic cord signal abnormality suggesting early cord edema. 3. Small disc bulge at C5-C6, slightly left eccentric, indenting the  ventral thecal sac with subtle ventral cord flattening and no  cord signal abnormality. 4. Additional degenerative changes as above. Electronically signed by: Donnice Mania MD 05/01/2024 06:59 PM EST RP Workstation: [id number]   DG Shoulder Right Result Date: 05/01/2024 EXAM: 1 VIEW XRAY OF THE RIGHT SHOULDER 05/01/2024 10:41:00 AM COMPARISON: None available. CLINICAL HISTORY: Shoulder pain, right. FINDINGS: BONES AND JOINTS: No dislocation. Subtle heterogeneous appearance of the proximal right humeral head metadiaphysis. Attempted axillary views are nondiagnostic. SOFT TISSUES: No abnormal calcifications. Visualized lung is unremarkable. IMPRESSION: 1. although exam is limited by patient body habitus, suspect a lucent area of heterogeneity in the proximal humerus. Differential considerations include metastatic disease or multiple myeloma. correlate with primary malignancy history.primary bone malignancy could look similar. Electronically signed by: Rockey Kilts MD 05/01/2024 04:20 PM EST RP Workstation: [id number]   ECHOCARDIOGRAM COMPLETE Result Date: 04/30/2024    ECHOCARDIOGRAM REPORT   Patient Name:   HULET EHRMANN Date of Exam: 04/30/2024 Medical Rec #:  [id number]         Height:       69.0 in Accession #:    [id number]        Weight:       316.4 lb Date of Birth:  1980/12/16         BSA:          2.511 [name] Patient Age:    43 years          BP:           144/85 mmHg Patient Gender: [name]                 HR:           82 bpm. Exam Location:  Inpatient Procedure: 2D Echo, Cardiac Doppler and Color Doppler (Both Spectral and Color            Flow Doppler were utilized during procedure). Indications:    Pulmonary Embolus I26.09  History:        Patient has no prior history of Echocardiogram examinations.  Sonographer:    Jayson Gaskins Referring Phys: [phone] DANIEL P GOODRICH IMPRESSIONS  1. Left ventricular ejection fraction, by estimation, is 55 to 60%. The left ventricle has normal function. The left ventricle  demonstrates regional wall motion abnormalities (see scoring diagram/findings for description). The left ventricular internal cavity size was mildly dilated. Left ventricular diastolic parameters were normal.  2. Right ventricular systolic function is normal. The right ventricular size is mildly enlarged. Tricuspid regurgitation signal is inadequate for assessing PA pressure.  3. Right atrial size was mild to moderately dilated.  4. The mitral valve is normal in structure. Trivial mitral valve regurgitation. No evidence of mitral stenosis.  5. The aortic valve has an indeterminant number of cusps. Aortic valve regurgitation is not visualized. No aortic stenosis is present.  6. The inferior vena cava is normal in size with greater than 50% respiratory variability, suggesting right atrial pressure of 3 mmHg. Comparison(s): No prior Echocardiogram. FINDINGS  Left Ventricle: Left ventricular ejection fraction, by estimation, is 55 to 60%. The left ventricle has normal function. The left ventricle demonstrates regional wall motion abnormalities. The left ventricular internal cavity size was mildly dilated. There is no left ventricular hypertrophy. Left ventricular diastolic parameters were normal.  LV Wall Scoring: The basal inferolateral segment is hypokinetic. Right Ventricle: The right ventricular size is mildly enlarged. Right vetricular wall thickness was not well visualized. Right ventricular systolic function is normal. Tricuspid regurgitation signal is inadequate for assessing PA pressure. Left Atrium: Left atrial size  was normal in size. Right Atrium: Right atrial size was mild to moderately dilated. Pericardium: There is no evidence of pericardial effusion. Mitral Valve: The mitral valve is normal in structure. Trivial mitral valve regurgitation. No evidence of mitral valve stenosis. Tricuspid Valve: The tricuspid valve is normal in structure. Tricuspid valve regurgitation is not demonstrated. No evidence of  tricuspid stenosis. Aortic Valve: The aortic valve has an indeterminant number of cusps. Aortic valve regurgitation is not visualized. No aortic stenosis is present. Aortic valve mean gradient measures 4.0 mmHg. Aortic valve peak gradient measures 8.4 mmHg. Aortic valve area, by VTI measures 3.77 cm. Pulmonic Valve: The pulmonic valve was normal in structure. Pulmonic valve regurgitation is not visualized. No evidence of pulmonic stenosis. Aorta: The aortic root is normal in size and structure. Venous: The inferior vena cava is normal in size with greater than 50% respiratory variability, suggesting right atrial pressure of 3 mmHg. IAS/Shunts: The interatrial septum was not well visualized.  LEFT VENTRICLE PLAX 2D LVIDd:         6.20 cm   Diastology LVIDs:         4.50 cm   LV e' medial:    10.30 cm/s LV PW:         0.80 cm   LV E/e' medial:  7.8 LV IVS:        0.90 cm   LV e' lateral:   17.10 cm/s LVOT diam:     2.20 cm   LV E/e' lateral: 4.7 LV SV:         97 LV SV Index:   39 LVOT Area:     3.80 cm  RIGHT VENTRICLE RV S prime:     16.90 cm/s TAPSE ([name]-mode): 3.8 cm LEFT ATRIUM             Index        RIGHT ATRIUM           Index LA Vol (A2C):   52.5 ml 20.91 ml/[name]  RA Area:     23.40 cm LA Vol (A4C):   44.6 ml 17.76 ml/[name]  RA Volume:   85.30 ml  33.98 ml/[name] LA Biplane Vol: 50.9 ml 20.27 ml/[name]  AORTIC VALVE AV Area (Vmax):    3.28 cm AV Area (Vmean):   4.01 cm AV Area (VTI):     3.77 cm AV Vmax:           145.00 cm/s AV Vmean:          88.900 cm/s AV VTI:            0.258 [name] AV Peak Grad:      8.4 mmHg AV Mean Grad:      4.0 mmHg LVOT Vmax:         125.00 cm/s LVOT Vmean:        93.700 cm/s LVOT VTI:          0.256 [name] LVOT/AV VTI ratio: 0.99  AORTA Ao Root diam: 3.30 cm MITRAL VALVE MV Area (PHT): 3.10 cm    SHUNTS MV E velocity: 80.60 cm/s  Systemic VTI:  0.26 [name] MV A velocity: 69.40 cm/s  Systemic Diam: 2.20 cm MV E/A ratio:  1.16 Emeline Calender Electronically signed by Emeline Calender Signature Date/Time:  04/30/2024/3:13:20 PM    Final    MR BRAIN WO CONTRAST Result Date: 04/30/2024 EXAM: MRI Brain Without Contrast 04/30/2024 12:27:45 PM TECHNIQUE: Multiplanar multisequence MRI of the head/brain was performed without the administration of intravenous  contrast. COMPARISON: None available. CLINICAL HISTORY: Neuro deficit, acute, stroke suspected Neuro deficit, acute, stroke suspected FINDINGS: BRAIN AND VENTRICLES: No acute infarct. No intracranial hemorrhage. No mass. No midline shift. No hydrocephalus. The sella is unremarkable. Normal flow voids. ORBITS: No acute abnormality. SINUSES AND MASTOIDS: No acute abnormality. BONES AND SOFT TISSUES: Normal marrow signal. No acute soft tissue abnormality. IMPRESSION: 1. Normal brain MRI. Electronically signed by: Gilmore Molt MD 04/30/2024 01:23 PM EST RP Workstation: [id number]       IMPRESSION/PLAN: 1. 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 Dr. Dewey discusses the pathology findings and reviews the patient's course to date. He offers a course of palliative radiotherapy to the *** spine and to the right humerus. We discussed the risks, benefits, short, and long term effects of radiotherapy, as well as the palliative intent, and the patient is interested in proceeding. Dr. Dewey discusses the delivery and logistics of radiotherapy and anticipates a course of 2 weeks of radiotherapy. We will coordinate simulation with his inpatient team given his PT schedule.    In a visit lasting *** minutes, greater than 50% of the time was spent face to face discussing the patient's condition, in preparation for the discussion, and coordinating the patient's care.   The above documentation reflects my direct findings during this shared patient visit. Please see the separate note by Dr. Dewey on this date for the remainder of the patient's plan of care.    Donald KYM Husband, [name]   **Disclaimer: This note was dictated with  voice recognition software. Similar sounding words can inadvertently be transcribed and this note may contain transcription errors which may not have been corrected upon publication of note.**

## 2024-05-30 NOTE — Progress Notes (Signed)
 Daily Progress Note   Patient Name: Joseph [name]       Date: 05/30/2024 DOB: 05/15/1981  Age: 43 y.o. MRN#: [id number] Attending Physician: Briana Elgin LABOR, MD Primary Care Physician: Patient, No Pcp Per Admit Date: 05/21/2024  Reason for Consultation/Follow-up: Establishing goals of care and Pain control  Subjective: I have reviewed medical records including: EPIC notes: Hospitalist, nursing. Patient's case to be discussed during tumor board on 12/17 Vital signs stable MAR: Receiving for pain - Flexeril  3 times daily, lidocaine  patch (declining placement), Robaxin  4 times daily, Lyrica  3 times daily, methadone  every 8 hours, 0.5-1 mg IV Dilaudid  as needed, 30 mg oxycodone  as needed.  Methadone  10 mg started 12/7 at 2200, which was an increase from 7.5 mg.  Bowel regimen includes MiraLAX  daily and Senokot twice daily.  As needed medications administered in the last 24 hours: Dilaudid  x 3, oxycodone  x 4 Available advanced directives in ACP: none Labs: Creatinine 0.67 assessed for opioid prescribing, hemoglobin 9.6, albumin  2.3 assessed for overall health, nutritional status, and disease severity, helping predict prognosis and guide care Tests: EKG - 12/6 shows QT 348. Follow up EKG for QTc monitoring after methadone  adjustment to be completed tonight and tomorrow Intake/output: Last BM 12/7  Received report from primary RN -no acute concerns.  Per RN, patient ate all of his breakfast and noted that he feels like he is getting stronger every day.  Went to visit patient at bedside-no family/visitors present.  Patient was lying in bed awake, alert, oriented, and able to participate in conversation. No signs or non-verbal gestures of pain or discomfort noted. No respiratory distress, increased  work of breathing, or secretions noted.  Patient does endorse pain for which RN has just administered oxycodone .  Patient reports current pain regimen is working well.  Even though he is still experiencing pain he reports his pain is tolerable and much improved from previous.  Symptom management plan discussed with patient to include no changes to pain regimen today allowing methadone  time to reach a more steady state since it has been less than 24 hours since new dose was started.  Patient expresses understanding and appreciation.  Length of Stay: 9  Current Medications: Scheduled Meds:   apixaban   5 mg Oral BID   cyclobenzaprine   10 mg Oral TID   guaiFENesin   1,200 mg Oral BID   iron  polysaccharides  150 mg Oral Daily   lidocaine   1 patch Transdermal Daily   methadone   10 mg Oral Q8H   methocarbamol   750 mg Oral TID AC & HS   multivitamin with minerals  1 tablet Oral Daily   polyethylene glycol  17 g Oral BID   pregabalin   50 mg Oral TID   Ensure Max Protein  11 oz Oral BID   senna-docusate  2 tablet Oral BID    Continuous Infusions:   PRN Meds: acetaminophen  **OR** acetaminophen , albuterol , bisacodyl , HYDROmorphone  (DILAUDID ) injection, ondansetron  **OR** ondansetron  (ZOFRAN ) IV, oxyCODONE , sodium phosphate   Physical Exam Vitals and nursing note reviewed.  Constitutional:      General: He is not in acute distress. Pulmonary:     Effort: No respiratory distress.  Skin:    General: Skin is warm and dry.  Neurological:     Mental Status: He is alert and oriented to person, place, and time.     Motor: Weakness present.  Psychiatric:        Attention and Perception: Attention normal.        Behavior: Behavior is cooperative.        Cognition and Memory: Cognition and memory normal.             Vital Signs: BP 117/79 (BP Location: Right Arm)   Pulse 91   Temp 98.4 F (36.9 C) (Oral)   Resp 16   Ht 5' 9 (1.753 m)   Wt (!) 142.9 kg   SpO2 97%   BMI 46.52 kg/m   SpO2: SpO2: 97 % O2 Device: O2 Device: Room Air O2 Flow Rate: O2 Flow Rate (L/min): 1 L/min  Intake/output summary:  Intake/Output Summary (Last 24 hours) at 05/30/2024 1102 Last data filed at 05/30/2024 0221 Gross per 24 hour  Intake 240 ml  Output 550 ml  Net -310 ml   LBM: Last BM Date : 05/29/24 Baseline Weight: Weight: (!) 142.9 kg Most recent weight: Weight: (!) 142.9 kg       Palliative Assessment/Data: PPS 50%      Patient Active Problem List   Diagnosis Date Noted   Malignant neoplasm metastatic to bone (HCC) 05/24/2024   Normocytic anemia 05/21/2024   Pathologic humeral fracture 05/21/2024   Pathological fracture of right humerus 05/20/2024   Incomplete paraplegia (HCC) 05/17/2024   Pathologic fracture of thoracic vertebrae 05/16/2024   Papillary thyroid  carcinoma (HCC) 05/10/2024   Malignant neoplasm metastatic to both lungs (HCC) 05/10/2024   Metastatic cancer to spine (HCC) 05/06/2024   Papillary carcinoma (HCC) 05/06/2024   Thoracic spine fracture (HCC) 05/02/2024   Ataxia 05/01/2024   Right shoulder pain 05/01/2024   Pulmonary HTN (HCC) 04/29/2024   Low back pain 04/29/2024   Pulmonary nodules 04/29/2024   Thyroid  nodule 04/29/2024   Pulmonary embolism (HCC) 04/28/2024   Abscess 03/31/2018   Thrombocytopenia 03/31/2018   Depression 03/31/2018   Abscess of hand 03/31/2018   Abscess of right hand 03/31/2018   GAD (generalized anxiety disorder) 12/04/2014   Alcohol use disorder, severe, dependence (HCC) 12/03/2014   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 male  with past medical history of childhood asthma, polysubstance abuse,  and recently  diagnosed metastatic papillary thyroid  cancer admitted from CIR on 05/21/2024 with pathologic right proximal humerus fracture.    Patient initially presented to the hospital on 04/27/2024 with back pain and difficulty ambulating. He was found to have pulmonary embolism, pathologic T2 fracture, and large thyroid  nodule. Papillary carcinoma was diagnosed with thyroid  biopsy from 05/04/2024.  He underwent T1-T3 laminectomy and T2 corpectomy for resection of metastatic tumor on 05/05/2024. Patient was seen by PMT for pain management during this admission. He was discharged to inpatient rehabilitation on 05/16/2024.    PMT has been consulted to assist with pain management s/p ORIF of proximal humerus fracture and excessive sedation requiring narcan .  Assessment: Principal Problem:   Pathological fracture of right humerus Active Problems:   Pulmonary embolism (HCC)   Metastatic cancer to spine (HCC)   Papillary thyroid  carcinoma (HCC)   Malignant neoplasm metastatic to both lungs (HCC)   Incomplete paraplegia (HCC)   Normocytic anemia   Pathologic humeral fracture   Malignant neoplasm metastatic to bone [facility])     Recommendations/Plan: Continue current plan of care Continue full code as previously documented Patient's goal is for discharge to CIR Continue current pain regimen - no adjustments today Methadone  10 mg every 8 hours Oxycodone  30mg  every 3 hours as needed Dilaudid  IV 0.5-1mg   every 4 hours as needed EKG tonight at 2200 Referral to outpatient Palliative Medicine clinic at [facility] previously placed Patient would benefit from PMT consult in CIR for possible titration of methadone  if new dose remains suboptimal  PMT will continue to follow and support holistically   Goals of Care and Additional Recommendations: Limitations on Scope of Treatment: Full Scope Treatment  Code Status:    Code Status Orders  (From admission, onward)           Start     Ordered   05/21/24 0130   Full code  Continuous       Question:  By:  Answer:  Consent: discussion documented in EHR   05/21/24 0131           Code Status History     Date Active Date Inactive Code Status Order ID Comments User Context   05/16/2024 1529 05/21/2024 0023 Full Code [id number]  Pegge Toribio PARAS, PA-C Inpatient   05/16/2024 1529 05/16/2024 1529 Full Code [id number]  Pegge Toribio PARAS, PA-C Inpatient   04/28/2024 2016 05/16/2024 1523 Full Code [id number]  Arthea Child, MD Inpatient   03/31/2018 2330 04/02/2018 1721 Full Code [id number]  Alfornia Madison, MD Inpatient   12/02/2014 2154 12/07/2014 0901 Full Code [id number]  Cathryne Sherrell BRAVO, NP Inpatient   12/02/2014 1731 12/02/2014 2154 Full Code [id number]  Vicky Lamar [facility]       Prognosis:  Unable to determine  Discharge Planning: CIR   Care plan was discussed with primary RN, patient, Dr. Briana  Thank you for allowing the Palliative Medicine Team to assist in the care of this patient.  Billing based on MDM: High  Problems Addressed: One acute or chronic illness or injury that poses a threat to life or bodily function  Amount and/or Complexity of Data: Category 1:Review of prior external note(s) from each unique source, Review of the result(s) of each unique test, Ordering of each unique test, and Assessment requiring an independent historian(s) and Category 3:Discussion of management or test interpretation with external physician/other qualified health care professional/appropriate source (not separately reported)  Risks: Parenteral controlled substances     Jannifer Fischler CHRISTELLA Sharps, NP  Please contact Palliative Medicine Team phone at [phone] for questions and concerns.   *Portions of this note are a verbal dictation therefore any spelling and/or grammatical errors are due to the Dragon Medical One system interpretation.

## 2024-05-30 NOTE — Progress Notes (Addendum)
 Physical Therapy Treatment Patient Details Name: Joseph [name] MRN: [id number] DOB: 27-Jul-1980 Today's Date: 05/30/2024   History of Present Illness Pt is a 43 y.o. male presenting 11/28 from CIR with R shoulder pain; found to have R humeral pathologic fx. S/p IM rod placement 11/29. 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 At CIR 11/24-11/28. PMH: cocaine use disorder, cannibis use disorder.    PT Comments  Pt received in supine, agreeable to work with SPTA. Pt able to get EOB with min A with verbal cues for back precautions and assist with scooting EOB, heavy reliance on hospital bed rail/HOB elevation. Pt was able to stand to R platform RW with min A+2 with cues for hand placement and assist with RUE. Pt ambulated in room CGA with chair follow until pt fatigues with bil knees buckling, chair pulled up for him to sit. Pt was able to weight shift and perform LE exercises during the session for LE strengthening. Pt scored himself 5/10 on modified RPE scale (fatigue). Patient will benefit from intensive inpatient follow-up therapy, >3 hours/day.     If plan is discharge home, recommend the following: Two people to help with walking and/or transfers;Two people to help with bathing/dressing/bathroom;Assistance with cooking/housework;Assist for transportation;Help with stairs or ramp for entrance   Can travel by private vehicle        Equipment Recommendations  Other (comment) (Defer to next venue)    Recommendations for Other Services       Precautions / Restrictions Precautions Precautions: Fall;Back;Shoulder Type of Shoulder Precautions: elbow/wrist/hand ROM only, WBAT for transfers per op note Shoulder Interventions: Shoulder sling/immobilizer Precaution Booklet Issued: Yes  (comment) Recall of Precautions/Restrictions: Intact Precaution/Restrictions Comments: painful R shoulder, present prior to admission but pt states worse now Required Braces or Orthoses: Sling Restrictions Weight Bearing Restrictions Per Provider Order: Yes RUE Weight Bearing Per Provider Order: Weight bearing as tolerated RLE Weight Bearing Per Provider Order: Weight bearing as tolerated LLE Weight Bearing Per Provider Order: Weight bearing as tolerated Other Position/Activity Restrictions: Per Dr. Dozier We will allow weightbearing to tolerance given his lower extremity paraplegia and need for his upper extremities     Mobility  Bed Mobility Overal bed mobility: Needs Assistance Bed Mobility: Supine to Sit     Supine to sit: HOB elevated, Used rails, Min assist     General bed mobility comments: Pt requiring cues for rolling techniques for back precautions, but able to get EOB by doing more of a partial roll. Assist with scooting the pad with pt to get closer to EOB.    Transfers Overall transfer level: Needs assistance Equipment used: Right platform walker Transfers: Sit to/from Stand, Bed to chair/wheelchair/BSC Sit to Stand: Min assist, +2 physical assistance   Step pivot transfers: Min assist, +2 physical assistance, +2 safety/equipment       General transfer comment: Pt required cues for hand placement to stand from EOB to RPFRW. Pt c/o slight dizziness upon standing. Needing assist to place RUE onto platform.    Ambulation/Gait Ambulation/Gait assistance: Contact guard assist, Min assist, +2 safety/equipment Gait Distance (Feet): 15 Feet Assistive device: Right platform walker Gait Pattern/deviations: Step-through pattern, Decreased stride length, Decreased dorsiflexion - right, Decreased dorsiflexion - left, Trunk flexed, Narrow base of support Gait velocity: decr     General Gait Details: Pt presents  with decreased foot clearance and requires up to min A  with gait. CGA until pt knees began to buckle requiring min A to sit in chair behind him.   Stairs             Wheelchair Mobility     Tilt Bed    Modified Rankin (Stroke Patients Only)       Balance Overall balance assessment: History of Falls, Needs assistance Sitting-balance support: Feet supported Sitting balance-Leahy Scale: Fair Sitting balance - Comments: EOB   Standing balance support: Bilateral upper extremity supported, During functional activity Standing balance-Leahy Scale: Poor Standing balance comment: reliant on UE support                            Communication Communication Communication: No apparent difficulties  Cognition Arousal: Alert Behavior During Therapy: WFL for tasks assessed/performed, Anxious   PT - Cognitive impairments: No apparent impairments                         Following commands: Intact      Cueing Cueing Techniques: Verbal cues  Exercises General Exercises - Lower Extremity Hip Flexion/Marching: AROM, 10 reps, Left, Right, Seated Heel Raises: 10 reps, AROM, Both, Standing Other Exercises Other Exercises: Standing weight shifts (x5)    General Comments General comments (skin integrity, edema, etc.): Pt requires cues for back precautions with bed mobility and was able to ambulate in room with a chair follow, would continue to reccomend this due to pt fatigue and buckling mid gait. Pt scored himself 5/10 on modified RPE scale (fatigue) post session.      Pertinent Vitals/Pain Pain Assessment Pain Assessment: Faces Faces Pain Scale: Hurts little more Pain Location: R shoulder Pain Descriptors / Indicators: Discomfort, Guarding, Grimacing Pain Intervention(s): Limited activity within patient's tolerance, Monitored during session, Repositioned    Home Living                          Prior Function            PT Goals (current goals can now be found in the care plan section) Acute  Rehab PT Goals Patient Stated Goal: progress mobility PT Goal Formulation: With patient Time For Goal Achievement: 06/05/24 Progress towards PT goals: Progressing toward goals    Frequency    Min 3X/week      PT Plan      Co-evaluation              AM-PAC PT 6 Clicks Mobility   Outcome Measure  Help needed turning from your back to your side while in a flat bed without using bedrails?: A Lot Help needed moving from lying on your back to sitting on the side of a flat bed without using bedrails?: A Lot Help needed moving to and from a bed to a chair (including a wheelchair)?: A Lot Help needed standing up from a chair using your arms (e.g., wheelchair or bedside chair)?: A Lot Help needed to walk in hospital room?: A Lot Help needed climbing 3-5 steps with a railing? : Total 6 Click Score: 11    End of Session Equipment Utilized During Treatment: Other (comment) (R UE sling) Activity Tolerance: Patient limited by fatigue Patient left: in chair;with call bell/phone within reach;with chair alarm set Nurse Communication: Mobility status PT Visit Diagnosis: Unsteadiness on feet (R26.81);Other abnormalities of gait and mobility (  R26.89);Difficulty in walking, not elsewhere classified (R26.2);Pain Pain - Right/Left: Right Pain - part of body: Shoulder;Arm     Time: 8576-8551 PT Time Calculation (min) (ACUTE ONLY): 25 min  Charges:    $Gait Training: 8-22 mins $Therapeutic Activity: 8-22 mins PT General Charges $$ ACUTE PT VISIT: 1 Visit                     Johnnie Gaynelle JACQUE Johnnie Makailey Hodgkin 05/30/2024, 3:43 PM

## 2024-05-30 NOTE — Progress Notes (Signed)
 [facility] has been arranged for pt's 8:30 am radiation onc appointment  for tomorrow.  Questions please call [facility],[phone]. Jon Hoit RN,BSN,CM

## 2024-05-30 NOTE — Progress Notes (Signed)
 PROGRESS NOTE    Joseph [name]  FMW:[id number] DOB: [birth year] DOA: 05/21/2024 PCP: Patient, No Pcp Per   Brief Narrative: Joseph [name] is a 43 y.o. male with a history of asthma, polysubstance abuse, metastatic papillary thyroid  cancer. Patient presented from acute inpatient rehabilitation secondary to right shoulder pain and found to have evidence of a pathologic right proximal humerus fracture. Orthopedic surgery consulted for surgical repair. ENT consulted while admitted and recommended outpatient follow-up. Plan for discharge back to acute inpatient rehabilitation.   Assessment and Plan:  Pathologic right proximal humerus fracture Patient with previously known lucency of right proximal humerus consistent with metastasis. X-ray just prior to this admission significant for a lytic lesion with pathologic fracture of the right proximal humerus with mild displacement. Orthopedic surgery consulted and successfully placed an intramedullary rod on 11/29. -Orthopedic surgery recommendations: weight bearing as tolerated, hand, wrist, elbow only  Metastatic papillary thyroid  cancer Pulmonary nodules and bone lesions concerning for metastatic disease. Recently diagnosed on recent admission. Patient set up to follow-up with medical oncology, Dr. Tina. ENT and endocrinology referral placed on prior admission. ENT consulted. CT neck obtained and without lateral or central cervical lymphadenopathy. ENT plan to discuss at multidisciplinary tumor board on 06/08/24.  History of thoracic spine fracture Pathological in etiology. Patient underwent instrumentation and fusion on prior admission. -Continue pain management  History of pulmonary embolism Recently diagnosed on 11/6. Provoked secondary to metastatic cancer. Patient is managed on Eliquis , which was initially held for surgery and is now resumed. -Continue Eliquis   Paraplegia Neurogenic bladder Patient was admitted from acute  inpatient rehabilitation. Patient  -Continue PT/OT  Chronic normocytic anemia Stable.  Constipation -Continue MiraLAX   Chronic pain Patient's pain medication regimen was managed while in acute inpatient rehab. Patient developed overdose requiring narcan . Analgesics adjusted. Palliative care consulted. Patient started on methadone . -Palliative care recommendations: methadone , Lyrica , oxycodone  as needed, dilaudid  IV as needed  History of substance abuse Noted. Patient counseled on admission.  Constipation -Continue MiraLAX    DVT prophylaxis: Eliquis  Code Status:   Code Status: Full Code Family Communication: None at bedside Disposition Plan: Discharge likely back to acute inpatient rehabilitation. Stable for discharge at this time.   Consultants:  Orthopedic surgery Palliative care medicine  Procedures:  Insertion, intramedullary rod, humerus  Antimicrobials: None    Subjective: No issues noted from overnight events.  Objective: BP 117/79 (BP Location: Right Arm)   Pulse 91   Temp 98.4 F (36.9 C) (Oral)   Resp 16   Ht 5' 9 (1.753 m)   Wt (!) 142.9 kg   SpO2 97%   BMI 46.52 kg/m   Examination:  General exam: Appears calm and comfortable. Respiratory system: Respiratory effort normal.    Data Reviewed: I have personally reviewed following labs and imaging studies  CBC Lab Results  Component Value Date   WBC 8.0 05/25/2024   RBC 3.23 (L) 05/25/2024   HGB 9.6 (L) 05/25/2024   HCT 29.5 (L) 05/25/2024   MCV 91.3 05/25/2024   MCH 29.7 05/25/2024   PLT PLATELET CLUMPS NOTED ON SMEAR, UNABLE TO ESTIMATE 05/25/2024   MCHC 32.5 05/25/2024   RDW 13.4 05/25/2024   LYMPHSABS 1.5 05/25/2024   MONOABS 0.7 05/25/2024   EOSABS 0.3 05/25/2024   BASOSABS 0.0 05/25/2024     Last metabolic panel Lab Results  Component Value Date   NA 139 05/25/2024   K 4.0 05/25/2024   CL 100 05/25/2024   CO2 27 05/25/2024   BUN  9 05/25/2024   CREATININE 0.67  05/25/2024   GLUCOSE 114 (H) 05/25/2024   GFRNONAA >60 05/25/2024   GFRAA >60 08/16/2019   CALCIUM 8.7 (L) 05/25/2024   PHOS 4.8 (H) 05/25/2024   PROT 6.4 (L) 05/25/2024   ALBUMIN  2.3 (L) 05/25/2024   BILITOT 0.4 05/25/2024   ALKPHOS 95 05/25/2024   AST 19 05/25/2024   ALT 36 05/25/2024   ANIONGAP 12 05/25/2024    GFR: Estimated Creatinine Clearance: 167.7 mL/min (by C-G formula based on SCr of 0.67 mg/dL).  Recent Results (from the past 240 hours)  Culture, blood (Routine X 2) w Reflex to ID Panel     Status: None   Collection Time: 05/21/24 10:58 PM   Specimen: BLOOD  Result Value Ref Range Status   Specimen Description BLOOD BLOOD LEFT HAND  Final   Special Requests   Final    BOTTLES DRAWN AEROBIC AND ANAEROBIC Blood Culture results may not be optimal due to an inadequate volume of blood received in culture bottles   Culture   Final    NO GROWTH 5 DAYS Performed at [facility], [address], [place], [place] [address]    Report Status 05/26/2024 FINAL  Final  Culture, blood (Routine X 2) w Reflex to ID Panel     Status: None   Collection Time: 05/21/24 10:58 PM   Specimen: BLOOD  Result Value Ref Range Status   Specimen Description BLOOD BLOOD LEFT ARM  Final   Special Requests   Final    BOTTLES DRAWN AEROBIC ONLY Blood Culture results may not be optimal due to an inadequate volume of blood received in culture bottles   Culture   Final    NO GROWTH 5 DAYS Performed at [facility], [address], [place], [place] [address]    Report Status 05/26/2024 FINAL  Final      Radiology Studies: No results found.      LOS: 9 days    [name], MD [facility] 05/30/2024, 2:51 PM   If 7PM-7AM, please contact night-coverage [email or web address]

## 2024-05-31 ENCOUNTER — Ambulatory Visit: Payer: MEDICAID | Admitting: Radiation Oncology

## 2024-05-31 ENCOUNTER — Ambulatory Visit: Payer: MEDICAID

## 2024-05-31 DIAGNOSIS — M84521A Pathological fracture in neoplastic disease, right humerus, initial encounter for fracture: Secondary | ICD-10-CM | POA: Diagnosis not present

## 2024-05-31 DIAGNOSIS — C73 Malignant neoplasm of thyroid gland: Secondary | ICD-10-CM

## 2024-05-31 DIAGNOSIS — C7951 Secondary malignant neoplasm of bone: Secondary | ICD-10-CM | POA: Insufficient documentation

## 2024-05-31 DIAGNOSIS — Z510 Encounter for antineoplastic radiation therapy: Secondary | ICD-10-CM | POA: Insufficient documentation

## 2024-05-31 NOTE — Progress Notes (Signed)
 PT Cancellation Note  Patient Details Name: BUBBER ROTHERT MRN: [id number] DOB: 01-23-1981   Cancelled Treatment:    Reason Eval/Treat Not Completed: (P) Patient at procedure or test/unavailable (pt off unit at Radiation Oncology Dept.) Will continue efforts per PT plan of care as schedule permits.   [name] M Carolyn Sylvia 05/31/2024, 1:56 PM

## 2024-05-31 NOTE — PMR Pre-admission (Signed)
 PMR Admission Coordinator Pre-Admission Assessment  Patient: Joseph [name] is an 43 y.o., male MRN: [id number] DOB: May 20, 1981 Height: 5' 9 (175.3 cm) Weight: (!) 142.9 kg  Insurance Information HMO:     PPO:      PCP:      IPA:      80/20:      OTHER:  PRIMARY: [employer]      Policy#: ***      Subscriber: pt CM Name: ***      Phone#: ***     Fax#: *** Pre-Cert#: ***      Employer: *** Benefits:  Phone #: ***     Name: *** Eff. Date: ***     Deduct: ***      Out of Pocket Max: ***      Life Max: *** CIR: ***      SNF: *** Outpatient: ***     Co-Pay: *** Home Health: ***      Co-Pay: *** DME: ***     Co-Pay: *** Providers:  SECONDARY:       Policy#:      Phone#:   Financial Counselor:       Phone#:   The "Data Collection Information Summary" for patients in Inpatient Rehabilitation Facilities with attached "Privacy Act Statement-Health Care Records" was provided and verbally reviewed with: Patient and Family  Emergency Contact Information Contact Information     Name Relation Home Work Mobile   Temple Mother [phone]        Other Contacts     Name Relation Home Work Mobile   Dady,Waterman Father   [phone]       Current Medical History  Patient Admitting Diagnosis: pathological SCI and R humerus fx s/p operative fixation of both   History of Present Illness: Pt is a 43 y/o male with PMH of asthma, anxiety, and PSA who presented to [facility] on 11/5 with c/o low back pain, rib pain for several weeks until he was having difficulty walking and saddle anesthesia. In ED he was hemodynamically stable, and xray showed possible LUL nodule, and degenerative changes in the lumbar spine. CT of lumbar spine showed large Schmorl's node at the L5 endplate. CTA chest showed RUL segmental PE with incidental RLL nodules, enlarge L lobe of the thyroid , and gallstones. He was started on heparin  gtt and admitted to [facility] on 11/6. He continued to  have difficulty with ambulation and LE sensory changes so neurology was consulted and recommended full spine MRI which showed a pathologic fracture at T2 with several spinal canal stenosis and cord compression L>R with subtle thoracic cord signal change suggesting early cord edema. Xray shoulder showed humeral lucency concerning for malignancy. He was then transferred to [facility] on 11/9 for neurosurgery consultation. Recommendations were for operative intervention after eliquis  wash out. Pt underwent a posterior T1-3 instrumentation and fusion, with T1-3 laminectomy, T2 corpectomy for resection of metastatic tumor per dr. Darnella on 11/13. 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 Oncology was consulted and recommend outpatient follow up with ENT, radiation oncology, and endocrinology. Palliative consulted for goals and pain management. He has a prevena vac to his surgical incision which will be d/c'd on 11/27. He has been weaned off of dexamethasone . He is currently on eliquis . He did develop a fever the week of 11/17 but workup was negative and he has been afebrile with WBC trending down.  His PICC line was d/c'd on 11/24 in anticipation of transition to CIR and he admitted to CIR on the same day.  On rehab he was significantly limited by suspected rotator cuff pathology, requiring total assist for mobility/adls.  MRI of RUE completed and read 11/28 which showed large tumor with associated pathological fracture of proximal right humerus.  He was admitted back to the acute setting on 11/28 for intervention.  He underwent a IMN of R humerus per Dr. Dozier on 11/29 and post operatively he is to be WBAT to RUE.  Post op course significant for pain control in the setting of PMH of PSA, and planning/coordination of cancer treatment.  Radiation oncology consulted for palliative radiation to the spine and humerus.   Plan for 2 week course beginning 12/11 to the spine and 12/18 to the humerus.  He will f/u with oncology as an outpatient for any systemic treatment.  Therapy ongoing and pt has made remarkable progress with recommendations to return to CIR.     Patient's medical record from [facility] has been reviewed by the rehabilitation admission coordinator and physician.  Past Medical History  Past Medical History:  Diagnosis Date   Anxiety    Asthma    as a child   Depression    Narcotic abuse (HCC)    Pulmonary nodules 04/29/2024    Has the patient had major surgery during 100 days prior to admission? Yes  Family History   family history includes Alcohol abuse in an other family member; Other in an other family member.  Current Medications  Current Facility-Administered Medications:    acetaminophen  (TYLENOL ) tablet 650 mg, 650 mg, Oral, Q6H PRN, 650 mg at 05/23/24 0819 **OR** acetaminophen  (TYLENOL ) suppository 650 mg, 650 mg, Rectal, Q6H PRN, Opyd, Timothy S, MD   albuterol  (PROVENTIL ) (2.5 MG/3ML) 0.083% nebulizer solution 2.5 mg, 2.5 mg, Nebulization, Q6H PRN, Opyd, Timothy S, MD   apixaban  (ELIQUIS ) tablet 5 mg, 5 mg, Oral, BID, Bitonti, Michael T, RPH, 5 mg at 05/31/24 0813   bisacodyl  (DULCOLAX) EC tablet 5 mg, 5 mg, Oral, Daily PRN, Opyd, Timothy S, MD   cyclobenzaprine  (FLEXERIL ) tablet 10 mg, 10 mg, Oral, TID, Opyd, Timothy S, MD, 10 mg at 05/31/24 0813   guaiFENesin  (MUCINEX ) 12 hr tablet 1,200 mg, 1,200 mg, Oral, BID, Sheikh, Omair Latif, DO, 1,200 mg at 05/31/24 0813   HYDROmorphone  (DILAUDID ) injection 0.5-1 mg, 0.5-1 mg, Intravenous, Q4H PRN, Claudene Gauze M, NP, 1 mg at 05/30/24 2232   iron  polysaccharides (NIFEREX) capsule 150 mg, 150 mg, Oral, Daily, Sheikh, Omair Latif, DO, 150 mg at 05/31/24 0827   lidocaine  (LIDODERM ) 5 % 1 patch, 1 patch, Transdermal, Daily, Sherrill Cable [place], DO, 1 patch at 05/31/24 9188   methadone  (DOLOPHINE ) tablet 10 mg, 10 mg, Oral, Q8H, Claudene Gauze M, NP, 10 mg at 05/31/24 9491   methocarbamol  (ROBAXIN ) tablet 750 mg, 750 mg, Oral, TID AC & HS, Opyd, Timothy S, MD, 750 mg at 05/31/24 9187   multivitamin with minerals tablet 1 tablet, 1 tablet, Oral, Daily, Sherrill Cable Latif, DO, 1 tablet at 05/31/24 9186   ondansetron  (ZOFRAN ) tablet 4 mg, 4 mg, Oral, Q6H PRN **OR** ondansetron  (ZOFRAN ) injection 4 mg, 4 mg, Intravenous, Q6H PRN, [place], Amber, PA-C   oxyCODONE  (Oxy IR/ROXICODONE ) immediate release tablet 30 mg, 30 mg, Oral, Q3H PRN, Claudene Gauze M, NP, 30 mg at 05/31/24 0812   polyethylene glycol (MIRALAX  / GLYCOLAX ) packet 17 g, 17 g, Oral, Daily, Nettey,  Elgin LABOR, MD   pregabalin  (LYRICA ) capsule 50 mg, 50 mg, Oral, TID, Opyd, Timothy S, MD, 50 mg at 05/31/24 0827   protein supplement (ENSURE MAX) liquid, 11 oz, Oral, BID, Sherrill Cable [place], DO, 11 oz at 05/31/24 9185   senna-docusate (Senokot-S) tablet 2 tablet, 2 tablet, Oral, BID, Opyd, Timothy S, MD, 2 tablet at 05/31/24 0813   sodium phosphate  (FLEET) enema 1 enema, 1 enema, Rectal, Once PRN, Opyd, Timothy S, MD  Patients Current Diet:  Diet Order             Diet regular Room service appropriate? Yes; Fluid consistency: Thin  Diet effective now                   Precautions / Restrictions Precautions Precautions: Fall, Back, Shoulder Type of Shoulder Precautions: elbow/wrist/hand ROM only, WBAT for transfers per op note Precaution Booklet Issued: Yes (comment) Precaution/Restrictions Comments: painful R shoulder, present prior to admission but pt states worse now Restrictions Weight Bearing Restrictions Per Provider Order: No RUE Weight Bearing Per Provider Order: Weight bearing as tolerated RLE Weight Bearing Per Provider Order: Weight bearing as tolerated LLE Weight Bearing Per Provider Order: Weight bearing as tolerated Other Position/Activity Restrictions: Per Dr. Dozier We will allow weightbearing to tolerance given his lower extremity  paraplegia and need for his upper extremities   Has the patient had 2 or more falls or a fall with injury in the past year? Yes  Prior Activity Level Community (5-7x/wk): independent without DME prior to admit, driving  Prior Functional Level Self Care: Did the patient need help bathing, dressing, using the toilet or eating? Independent  Indoor Mobility: Did the patient need assistance with walking from room to room (with or without device)? Independent  Stairs: Did the patient need assistance with internal or external stairs (with or without device)? Independent  Functional Cognition: Did the patient need help planning regular tasks such as shopping or remembering to take medications? Independent  Patient Information Are you of Hispanic, Latino/a,or Spanish origin?: A. No, not of Hispanic, Latino/a, or Spanish origin What is your race?: B. Black or African American Do you need or want an interpreter to communicate with a doctor or health care staff?: 0. No  Patient's Response To:  Health Literacy and Transportation Is the patient able to respond to health literacy and transportation needs?: Yes Health Literacy - How often do you need to have someone help you when you read instructions, pamphlets, or other written material from your doctor or pharmacy?: Rarely In the past 12 months, has lack of transportation kept you from medical appointments or from getting medications?: No In the past 12 months, has lack of transportation kept you from meetings, work, or from getting things needed for daily living?: No  Journalist, Newspaper / Equipment Home Equipment: [place] - single point, Information systems manager, BSC/3in1  Prior Device Use: Indicate devices/aids used by the patient prior to current illness, exacerbation or injury? None of the above  Current Functional Level Cognition  Orientation Level: Oriented X4    Extremity Assessment (includes Sensation/Coordination)  Upper Extremity Assessment:  Right hand dominant, RUE deficits/detail RUE Deficits / Details: elbow/wrist/hand ROM WFL. RUE Coordination: decreased gross motor  Lower Extremity Assessment: Defer to PT evaluation RLE Sensation: decreased light touch LLE Sensation: decreased light touch    ADLs  Overall ADL's : Needs assistance/impaired Eating/Feeding: Set up, Sitting Eating/Feeding Details (indicate cue type and reason): for cutting up food Grooming: Set up, Oral  care, Wash/dry face, Sitting Grooming Details (indicate cue type and reason): Setup to wash face and ears, Mod A to don deodorant - assist to don under L arm. Pt able to use LUE to don under R arm Upper Body Bathing: Moderate assistance, Sitting Lower Body Bathing: Maximal assistance, Sit to/from stand, Sitting/lateral leans Lower Body Bathing Details (indicate cue type and reason): assist to bathe bottom in standing Upper Body Dressing : Maximal assistance Upper Body Dressing Details (indicate cue type and reason): Max A to doff UB dressing Lower Body Dressing: Maximal assistance, Sitting/lateral leans Lower Body Dressing Details (indicate cue type and reason): sock mgmt, pt unable to reach or manage with one UE Toilet Transfer: Moderate assistance, +2 for physical assistance, +2 for safety/equipment, Stand-pivot (R platform RW) Toilet Transfer Details (indicate cue type and reason): STS with stedy this session Toileting- Clothing Manipulation and Hygiene: Maximal assistance Toileting - Clothing Manipulation Details (indicate cue type and reason): Pt used bedside urinal while supine    Mobility  Overal bed mobility: Needs Assistance Bed Mobility: Supine to Sit, Sit to Supine Rolling: +2 for safety/equipment, +2 for physical assistance, Min assist Sidelying to sit: Min assist, +2 for physical assistance Supine to sit: Contact guard, HOB elevated, Used rails Sit to supine: Mod assist General bed mobility comments: Pt able to come to L EOB with CGA and  increased time. Pt required Mod A for BLE management for return to supine and Mod A for repositioning in bed.    Transfers  Overall transfer level: Needs assistance Equipment used: Right platform walker Transfers: Sit to/from Stand Sit to Stand: Min assist, From elevated surface Bed to/from chair/wheelchair/BSC transfer type:: Step pivot Step pivot transfers: Min assist, +2 physical assistance, +2 safety/equipment Transfer via Lift Equipment: Stedy General transfer comment: Pt required Min A to stand from bed slightly elevated with cues for anterior lean and proper hand placement. Pt took three side steps to the left towards [facility] with Min A. Pt with increased fatigue with mobility. Min A to sit at EOB.    Ambulation / Gait / Stairs / Wheelchair Mobility  Ambulation/Gait Ambulation/Gait assistance: Contact guard assist, Min assist, +2 safety/equipment Gait Distance (Feet): 15 Feet Assistive device: Right platform walker Gait Pattern/deviations: Step-through pattern, Decreased stride length, Decreased dorsiflexion - right, Decreased dorsiflexion - left, Trunk flexed, Narrow base of support General Gait Details: Pt presents with decreased foot clearance and requires up to min A with gait. CGA until pt knees began to buckle requiring min A to sit in chair behind him. Gait velocity: decr    Posture / Balance Dynamic Sitting Balance Sitting balance - Comments: Sit EOB with supervision Balance Overall balance assessment: History of Falls, Needs assistance Sitting-balance support: Feet supported, No upper extremity supported Sitting balance-Leahy Scale: Fair Sitting balance - Comments: Sit EOB with supervision Standing balance support: Bilateral upper extremity supported, During functional activity, Reliant on assistive device for balance Standing balance-Leahy Scale: Poor Standing balance comment: Dependent on RW and external support    Special considerations/life events  Skin surgical  incision to back and RUE and Special service needs radiation treatments starting on 12/11 and to occur every weekday through 12/24, outpatient f/u with oncology for systemic treatment plan   Previous Home Environment (from acute therapy documentation) Living Arrangements: Spouse/significant other Available Help at Discharge: Family, Available 24 hours/day Type of Home: House Home Layout: One level Home Access: Stairs to enter Entrance Stairs-Rails: None Entrance Stairs-Number of Steps: 2 Bathroom Shower/Tub: Psychologist, counselling  Bathroom Toilet: Handicapped height Home Care Services: No  Discharge Living Setting Plans for Discharge Living Setting: Lives with (comment) (aunt) Type of Home at Discharge: House Discharge Home Layout: One level Discharge Home Access: Stairs to enter Entrance Stairs-Rails: None Entrance Stairs-Number of Steps: 1 Discharge Bathroom Shower/Tub: Walk-in shower Discharge Bathroom Toilet: Standard Discharge Bathroom Accessibility: Yes How Accessible: Accessible via walker Does the patient have any problems obtaining your medications?: No  Social/Family/Support Systems Anticipated Caregiver: Patient will stay with aunt Lyndell Cook) who can provide supervision, other family available PRN to assist. Anticipated Caregiver's Contact Information: [place] [phone] Ability/Limitations of Caregiver: pt's aunt undergoing chemo per chart review Caregiver Availability: 24/7 Discharge Plan Discussed with Primary Caregiver: Yes Is Caregiver In Agreement with Plan?: Yes Does Caregiver/Family have Issues with Lodging/Transportation while Pt is in Rehab?: No  Goals Patient/Family Goal for Rehab: PT/OT supervision to mod I ambulatory level with RW, SLP n/a Expected length of stay: 10-14 days Additional Information: Discharge plan: will d/c to aunt's home where he will have supervision from her and other family up to 24/7 if needed. Pt/Family Agrees to Admission and willing to  participate: Yes Program Orientation Provided & Reviewed with Pt/Caregiver Including Roles  & Responsibilities: Yes  Decrease burden of Care through IP rehab admission: n/a  Possible need for SNF placement upon discharge:  No.   Patient Condition: I have reviewed medical records from [facility], spoken with [facility] team, and patient and family member. I met with patient at the bedside for inpatient rehabilitation assessment.  Patient will benefit from ongoing PT and OT, can actively participate in 3 hours of therapy a day 5 days of the week, and can make measurable gains during the admission.  Patient will also benefit from the coordinated team approach during an Inpatient Acute Rehabilitation admission.  The patient will receive intensive therapy as well as Rehabilitation physician, nursing, social worker, and care management interventions.  Due to bladder management, bowel management, safety, skin/wound care, disease management, medication administration, pain management, and patient education the patient requires 24 hour a day rehabilitation nursing.  The patient is currently min assist with mobility and basic ADLs.  Discharge setting and therapy post discharge at home with outpatient is anticipated.  Patient has agreed to participate in the Acute Inpatient Rehabilitation Program and will admit ***.  Preadmission Screen Completed By:  Reche FORBES Lowers, 05/31/2024 10:34 AM ______________________________________________________________________   Discussed status with Dr. PIERRETTE on *** at *** and received approval for admission today.  Admission Coordinator:  Caitlin E Warren, PT, time PIERRETTEPattricia ***   Assessment/Plan: Diagnosis: *** Does the need for close, 24 hr/day Medical supervision in concert with the patient's rehab needs make it unreasonable for this patient to be served in a less intensive setting? {yes_no_potentially:3041433} Co-Morbidities requiring supervision/potential complications: *** Due to  {due un:6958565}, does the patient require 24 hr/day rehab nursing? {yes_no_potentially:3041433} Does the patient require coordinated care of a physician, rehab nurse, PT, OT, and SLP to address physical and functional deficits in the context of the above medical diagnosis(es)? {yes_no_potentially:3041433} Addressing deficits in the following areas: {deficits:3041436} Can the patient actively participate in an intensive therapy program of at least 3 hrs of therapy 5 days a week? {yes_no_potentially:3041433} The potential for patient to make measurable gains while on inpatient rehab is {potential:3041437} Anticipated functional outcomes upon discharge from inpatient rehab: {functional outcomes:304600100} PT, {functional outcomes:304600100} OT, {functional outcomes:304600100} SLP Estimated rehab length of stay to reach the above functional goals is: *** Anticipated discharge  destination: {anticipated dc setting:21604} 10. Overall Rehab/Functional Prognosis: {potential:3041437}   MD Signature: ***

## 2024-05-31 NOTE — Progress Notes (Signed)
 Joseph NOTE    HALLIS [name]  FMW:[id number] DOB: 1980-12-29 DOA: 05/21/2024 PCP: Patient, No Pcp Per   Brief Narrative: Joseph [name] is a 43 y.o. male with a history of asthma, polysubstance abuse, metastatic papillary thyroid  cancer. Patient presented from acute inpatient rehabilitation secondary to right shoulder pain and found to have evidence of a pathologic right proximal humerus fracture. Orthopedic surgery consulted for surgical repair. ENT consulted while admitted and recommended outpatient follow-up. Plan for discharge back to acute inpatient rehabilitation.   Assessment and Plan:  Pathologic right proximal humerus fracture Patient with previously known lucency of right proximal humerus consistent with metastasis. X-ray just prior to this admission significant for a lytic lesion with pathologic fracture of the right proximal humerus with mild displacement. Orthopedic surgery consulted and successfully placed an intramedullary rod on 11/29. -Orthopedic surgery recommendations: weight bearing as tolerated, hand, wrist, elbow only  Metastatic papillary thyroid  cancer Pulmonary nodules and bone lesions concerning for metastatic disease. Recently diagnosed on recent admission. Patient set up to follow-up with medical oncology, Dr. Tina. ENT and endocrinology referral placed on prior admission. ENT consulted. CT neck obtained and without lateral or central cervical lymphadenopathy. ENT plan to discuss at multidisciplinary tumor board on 06/08/24. Radiation oncology plan to establish care with patient today and plan initiation of radiation therapy.  History of thoracic spine fracture Pathological in etiology. Patient underwent instrumentation and fusion on prior admission. -Continue pain management  History of pulmonary embolism Recently diagnosed on 11/6. Provoked secondary to metastatic cancer. Patient is managed on Eliquis , which was initially held for surgery and is now  resumed. -Continue Eliquis   Paraplegia Neurogenic bladder Patient was admitted from acute inpatient rehabilitation. Patient  -Continue PT/OT  Chronic normocytic anemia Stable.  Constipation -Continue MiraLAX   Chronic pain Patient's pain medication regimen was managed while in acute inpatient rehab. Patient developed overdose requiring narcan . Analgesics adjusted. Palliative care consulted. Patient started on methadone . -Palliative care recommendations: methadone , Lyrica , oxycodone  as needed, dilaudid  IV as needed  History of substance abuse Noted. Patient counseled on admission.  Constipation -Continue MiraLAX    DVT prophylaxis: Eliquis  Code Status:   Code Status: Full Code Family Communication: None at bedside Disposition Plan: Discharge likely back to acute inpatient rehabilitation. Stable for discharge at this time.   Consultants:  Orthopedic surgery Palliative care medicine  Procedures:  Insertion, intramedullary rod, humerus  Antimicrobials: None    Subjective: No concerns this morning.   Objective: BP 127/66 (BP Location: Left Arm)   Pulse 91   Temp (!) 97.5 F (36.4 C) (Oral)   Resp 18   Ht 5' 9 (1.753 m)   Wt (!) 142.9 kg   SpO2 91%   BMI 46.52 kg/m   Examination:  General exam: Appears calm and comfortable. Respiratory system: Clear to auscultation. Respiratory effort normal. Cardiovascular system: S1 & S2 heard, RRR. Gastrointestinal system: Abdomen is nondistended, soft and nontender. Normal bowel sounds heard. Central nervous system: Alert and oriented. Musculoskeletal: No edema. No calf tenderness Psychiatry: Judgement and insight appear normal. Mood & affect appropriate.    Data Reviewed: I have personally reviewed following labs and imaging studies  CBC Lab Results  Component Value Date   WBC 8.0 05/25/2024   RBC 3.23 (L) 05/25/2024   HGB 9.6 (L) 05/25/2024   HCT 29.5 (L) 05/25/2024   MCV 91.3 05/25/2024   MCH 29.7  05/25/2024   PLT PLATELET CLUMPS NOTED ON SMEAR, UNABLE TO ESTIMATE 05/25/2024   MCHC 32.5 05/25/2024  RDW 13.4 05/25/2024   LYMPHSABS 1.5 05/25/2024   MONOABS 0.7 05/25/2024   EOSABS 0.3 05/25/2024   BASOSABS 0.0 05/25/2024     Last metabolic panel Lab Results  Component Value Date   NA 139 05/25/2024   K 4.0 05/25/2024   CL 100 05/25/2024   CO2 27 05/25/2024   BUN 9 05/25/2024   CREATININE 0.67 05/25/2024   GLUCOSE 114 (H) 05/25/2024   GFRNONAA >60 05/25/2024   GFRAA >60 08/16/2019   CALCIUM 8.7 (L) 05/25/2024   PHOS 4.8 (H) 05/25/2024   PROT 6.4 (L) 05/25/2024   ALBUMIN  2.3 (L) 05/25/2024   BILITOT 0.4 05/25/2024   ALKPHOS 95 05/25/2024   AST 19 05/25/2024   ALT 36 05/25/2024   ANIONGAP 12 05/25/2024    GFR: Estimated Creatinine Clearance: 167.7 mL/min (by C-G formula based on SCr of 0.67 mg/dL).  Recent Results (from the past 240 hours)  Culture, blood (Routine X 2) w Reflex to ID Panel     Status: None   Collection Time: 05/21/24 10:58 PM   Specimen: BLOOD  Result Value Ref Range Status   Specimen Description BLOOD BLOOD LEFT HAND  Final   Special Requests   Final    BOTTLES DRAWN AEROBIC AND ANAEROBIC Blood Culture results may not be optimal due to an inadequate volume of blood received in culture bottles   Culture   Final    NO GROWTH 5 DAYS Performed at [facility], [address], [place], [place] [address]    Report Status 05/26/2024 FINAL  Final  Culture, blood (Routine X 2) w Reflex to ID Panel     Status: None   Collection Time: 05/21/24 10:58 PM   Specimen: BLOOD  Result Value Ref Range Status   Specimen Description BLOOD BLOOD LEFT ARM  Final   Special Requests   Final    BOTTLES DRAWN AEROBIC ONLY Blood Culture results may not be optimal due to an inadequate volume of blood received in culture bottles   Culture   Final    NO GROWTH 5 DAYS Performed at [facility], [address], [place], [place] [address]    Report Status  05/26/2024 FINAL  Final      Radiology Studies: No results found.      LOS: 10 days    [name], MD [facility] 05/31/2024, 8:14 AM   If 7PM-7AM, please contact night-coverage [email or web address]

## 2024-05-31 NOTE — Progress Notes (Signed)
  Daily Progress Note   Patient Name: Joseph [name]       Date: 05/31/2024 DOB: [birth year]  Age: 43 y.o. MRN#: [id number] Attending Physician: Briana Elgin LABOR, MD Primary Care Physician: Patient, No Pcp Per Admit Date: 05/21/2024 Length of Stay: 10 days  Reason for Consultation/Follow-up: Pain control  Subjective:  Subjective:   Review of Systems Pain, but improved overall Objective:   Vital Signs:  BP (!) 146/80 (BP Location: Left Arm)   Pulse 91   Temp 98.2 F (36.8 C) (Oral)   Resp 17   Ht 5' 9 (1.753 m)   Wt (!) 142.9 kg   SpO2 95%   BMI 46.52 kg/m   Physical Exam: General: NAD, alert Eyes: conjunctiva clear, anicteric sclera HENT: normocephalic, atraumatic, moist mucous membranes Cardiovascular: RRR, no edema in LE b/l Respiratory: no increased work of breathing noted, not in respiratory distress Abdomen: not distended Extremities:  Skin: no rashes or lesions on visible skin Neuro: A&Ox4, following commands easily Psych: appropriately answers all questions  Imaging: @IMAGES @  I personally reviewed recent imaging.   Assessment & Plan:   Assessment:  Recommendations/Plan: # Complex medical decision making/goals of care:  -   -  Code Status: Full Code  Prognosis: Unable to determine  # Symptom management: Patient is receiving these palliative interventions for symptom management with an intent to improve quality of life.   -  # Psychosocial Support:  -  # Discharge Planning: CIR when medically stable and pain controlled  -  Discussed with: Patient  Thank you for allowing the palliative care team to participate in the care Joseph [name] [place].  Amaryllis Meissner, MD Palliative Care Provider PMT # [phone]  If patient remains symptomatic despite maximum doses, please call PMT at [phone] between 0700 and 1900. Outside of these hours, please call attending, as PMT does not have night coverage.   Billing based on MDM: High  Problems  Addressed: One acute or chronic illness or injury that poses a threat to life or bodily function  Amount and/or Complexity of Data: Category 1:Review of prior external note(s) from each unique source and Review of the result(s) of each unique test  Risks: Parenteral controlled substances and Drug therapy requiring intensive monitoring for toxicity

## 2024-05-31 NOTE — Progress Notes (Signed)
 Inpatient Rehab Admissions Coordinator:   Stopped by to see pt at bedside but had already left floor for radiation simulation.  Will f/u.   Reche Lowers, PT, DPT Admissions Coordinator [phone] 05/31/24 2:05 PM

## 2024-06-01 DIAGNOSIS — Z515 Encounter for palliative care: Secondary | ICD-10-CM | POA: Diagnosis not present

## 2024-06-01 DIAGNOSIS — C7802 Secondary malignant neoplasm of left lung: Secondary | ICD-10-CM | POA: Diagnosis not present

## 2024-06-01 DIAGNOSIS — C73 Malignant neoplasm of thyroid gland: Secondary | ICD-10-CM | POA: Diagnosis not present

## 2024-06-01 DIAGNOSIS — G893 Neoplasm related pain (acute) (chronic): Secondary | ICD-10-CM | POA: Diagnosis not present

## 2024-06-01 NOTE — Progress Notes (Signed)
 PROGRESS NOTE    Joseph [name]  FMW:[id number] DOB: Nov 11, 1980 DOA: 05/21/2024 PCP: Patient, No Pcp Per   Brief Narrative: Joseph [name] is a 43 y.o. male with a history of asthma, polysubstance abuse, metastatic papillary thyroid  cancer. Patient presented from acute inpatient rehabilitation secondary to right shoulder pain and found to have evidence of a pathologic right proximal humerus fracture. Orthopedic surgery consulted for surgical repair. ENT consulted while admitted and recommended outpatient follow-up. Plan for discharge back to acute inpatient rehabilitation.   Assessment and Plan:  Pathologic right proximal humerus fracture Patient with previously known lucency of right proximal humerus consistent with metastasis. X-ray just prior to this admission significant for a lytic lesion with pathologic fracture of the right proximal humerus with mild displacement. Orthopedic surgery consulted and successfully placed an intramedullary rod on 11/29. -Orthopedic surgery recommendations: weight bearing as tolerated, hand, wrist, elbow only -pain control   Metastatic papillary thyroid  cancer Pulmonary nodules and bone lesions concerning for metastatic disease. Recently diagnosed on recent admission. Patient set up to follow-up with medical oncology, Dr. Tina. ENT and endocrinology referral placed on prior admission. ENT consulted. CT neck obtained and without lateral or central cervical lymphadenopathy. ENT plan to discuss at multidisciplinary tumor board on 06/08/24. Radiation oncology plan to establish care with patient today and plan initiation of radiation therapy.  History of thoracic spine fracture Pathological in etiology. Patient underwent instrumentation and fusion on prior admission. -Continue pain management -anticipate a course of 2 weeks of radiotherapy to the thoracic spine beginning on Thursday this week, and 5 fraction. We will simulate today and begin treatment to  the spine on 06/02/24, and to the right humerus on 06/09/24   History of pulmonary embolism Recently diagnosed on 11/6. Provoked secondary to metastatic cancer. Patient is managed on Eliquis , which was initially held for surgery and is now resumed. -Continue Eliquis   Paraplegia Neurogenic bladder Patient was admitted from acute inpatient rehabilitation. Patient  -Continue PT/OT  Chronic normocytic anemia Stable.  Constipation -Continue MiraLAX   Chronic pain Patient's pain medication regimen was managed while in acute inpatient rehab. Patient developed overdose requiring narcan . Analgesics adjusted. Palliative care consulted. Patient started on methadone . -Palliative care recommendations: methadone , Lyrica , oxycodone  as needed, dilaudid  IV as needed-- trying to limit IV so patient can return to CIR  History of substance abuse Noted. Patient counseled on admission.    DVT prophylaxis: Eliquis  Code Status:   Code Status: Full Code Family Communication: None at bedside Disposition Plan: Discharge likely back to acute inpatient rehabilitation. Stable for discharge at this time.   Consultants:  Orthopedic surgery Palliative care medicine CIR Radiation oncology  Procedures:  Insertion, intramedullary rod, humerus   Subjective: Trying to limit dilaudid  use  Objective: BP 124/70 (BP Location: Right Wrist)   Pulse 89   Temp 98.1 F (36.7 C)   Resp 20   Ht 5' 9 (1.753 m)   Wt (!) 142.9 kg   SpO2 95%   BMI 46.52 kg/m   Examination:   General: Appearance:    Severely obese male in no acute distress     Lungs:     , respirations unlabored  Heart:    Normal heart rate.   MS:   All extremities are intact. + edema  Neurologic:   Awake, alert, oriented x 3.       Data Reviewed: I have personally reviewed following labs and imaging studies  CBC Lab Results  Component Value Date   WBC 8.0  05/25/2024   RBC 3.23 (L) 05/25/2024   HGB 9.6 (L) 05/25/2024   HCT  29.5 (L) 05/25/2024   MCV 91.3 05/25/2024   MCH 29.7 05/25/2024   PLT PLATELET CLUMPS NOTED ON SMEAR, UNABLE TO ESTIMATE 05/25/2024   MCHC 32.5 05/25/2024   RDW 13.4 05/25/2024   LYMPHSABS 1.5 05/25/2024   MONOABS 0.7 05/25/2024   EOSABS 0.3 05/25/2024   BASOSABS 0.0 05/25/2024     Last metabolic panel Lab Results  Component Value Date   NA 139 05/25/2024   K 4.0 05/25/2024   CL 100 05/25/2024   CO2 27 05/25/2024   BUN 9 05/25/2024   CREATININE 0.67 05/25/2024   GLUCOSE 114 (H) 05/25/2024   GFRNONAA >60 05/25/2024   GFRAA >60 08/16/2019   CALCIUM 8.7 (L) 05/25/2024   PHOS 4.8 (H) 05/25/2024   PROT 6.4 (L) 05/25/2024   ALBUMIN  2.3 (L) 05/25/2024   BILITOT 0.4 05/25/2024   ALKPHOS 95 05/25/2024   AST 19 05/25/2024   ALT 36 05/25/2024   ANIONGAP 12 05/25/2024    GFR: Estimated Creatinine Clearance: 167.7 mL/min (by C-G formula based on SCr of 0.67 mg/dL).  No results found for this or any previous visit (from the past 240 hours).     Radiology Studies: No results found.      LOS: 11 days    Harlene Bowl DO [facility] 06/01/2024, 10:33 AM   If 7PM-7AM, please contact night-coverage [email or web address]

## 2024-06-01 NOTE — Progress Notes (Signed)
 Physical Therapy Treatment Patient Details Name: Joseph [name] MRN: [id number] DOB: 07/17/1980 Today's Date: 06/01/2024   History of Present Illness Pt is a 43 y.o. male presenting 11/28 from CIR with R shoulder pain; found to have R humeral pathologic fx. S/p IM rod placement 11/29. 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 At CIR 11/24-11/28. PMH: cocaine use disorder, cannibis use disorder.    PT Comments  Pt received in chair, requesting assist to return to bed, nursing staff busy so pt requesting PTA assist. PTA reinforced log roll technique with return to supine, with pt requiring slightly less assist in afternoon compared with OT in previous session for LE lifting, and pt continues to require minA for lift assist with sit>stand from chair surface height and stand>sit due to fatigue. Pt defers SCD on at end of session, reports feeling warm (fan provided), pt agreeable to don SCDs later in evening. Patient will benefit from intensive inpatient follow-up therapy, >3 hours/day.   If plan is discharge home, recommend the following: Two people to help with walking and/or transfers;Two people to help with bathing/dressing/bathroom;Assistance with cooking/housework;Assist for transportation;Help with stairs or ramp for entrance   Can travel by private vehicle        Equipment Recommendations  Other (comment) (TBD)    Recommendations for Other Services       Precautions / Restrictions Precautions Precautions: Fall;Back;Shoulder Type of Shoulder Precautions: elbow/wrist/hand ROM only, WBAT for transfers per op note Shoulder Interventions: Shoulder sling/immobilizer Precaution Booklet Issued: Yes (comment) Recall of Precautions/Restrictions: Intact Precaution/Restrictions Comments:  painful R shoulder, present prior to admission but pt states worse now Required Braces or Orthoses: Sling Restrictions Weight Bearing Restrictions Per Provider Order: Yes RUE Weight Bearing Per Provider Order: Weight bearing as tolerated RLE Weight Bearing Per Provider Order: Weight bearing as tolerated LLE Weight Bearing Per Provider Order: Weight bearing as tolerated Other Position/Activity Restrictions: Per Dr. Dozier We will allow weightbearing to tolerance given his lower extremity paraplegia and need for his upper extremities     Mobility  Bed Mobility Overal bed mobility: Needs Assistance Bed Mobility: Sit to Sidelying, Rolling Rolling: Supervision Sidelying to sit: Supervision     Sit to sidelying: Min assist, Used rails General bed mobility comments: Assist for BLE over edge of bed, cues for log roll, pt using rail PRN to return to supine    Transfers Overall transfer level: Needs assistance Equipment used: Right platform walker Transfers: Sit to/from Stand, Bed to chair/wheelchair/BSC Sit to Stand: Min assist, From elevated surface, +2 safety/equipment   Step pivot transfers: Min assist, +2 safety/equipment       General transfer comment: chair>RW and RW> EOB via step pivot toward his R, pt able to maintain standing a couple mins while bed linens changed by NT.    Ambulation/Gait Ambulation/Gait assistance: Contact guard assist, +2 safety/equipment Gait Distance (Feet): 40 Feet Assistive device: Right platform walker Gait Pattern/deviations: Step-through pattern, Decreased stride length, Narrow base of support Gait velocity: decr     General Gait Details: pt defers due to pain/fatigue, and visitors arriving to his room with his dinner while pt returned to bed   Stairs             Wheelchair Mobility     Tilt Bed    Modified Rankin (Stroke Patients Only)  Balance Overall balance assessment: History of Falls, Needs  assistance Sitting-balance support: Feet supported, No upper extremity supported Sitting balance-Leahy Scale: Good     Standing balance support: Bilateral upper extremity supported, During functional activity, Reliant on assistive device for balance Standing balance-Leahy Scale: Poor Standing balance comment: Dependent on platform walker and external support                            Communication Communication Communication: No apparent difficulties  Cognition Arousal: Alert Behavior During Therapy: WFL for tasks assessed/performed   PT - Cognitive impairments: No apparent impairments                         Following commands: Intact      Cueing Cueing Techniques: Verbal cues, Visual cues  Exercises General Exercises - Lower Extremity Ankle Circles/Pumps: AROM, Both, 5 reps, Supine    General Comments General comments (skin integrity, edema, etc.): sling in place, adjusted as needed      Pertinent Vitals/Pain Pain Assessment Pain Assessment: 0-10 Pain Score: 7  Pain Location: R arm and shoulder Pain Descriptors / Indicators: Discomfort, Guarding, Grimacing Pain Intervention(s): Limited activity within patient's tolerance, Monitored during session, Repositioned, Patient requesting pain meds-RN notified    Home Living                          Prior Function            PT Goals (current goals can now be found in the care plan section) Acute Rehab PT Goals Patient Stated Goal: progress mobility PT Goal Formulation: With patient Time For Goal Achievement: 06/05/24 Progress towards PT goals: Progressing toward goals    Frequency    Min 3X/week      PT Plan      Co-evaluation              AM-PAC PT 6 Clicks Mobility   Outcome Measure  Help needed turning from your back to your side while in a flat bed without using bedrails?: A Little Help needed moving from lying on your back to sitting on the side of a flat bed  without using bedrails?: A Little Help needed moving to and from a bed to a chair (including a wheelchair)?: A Little Help needed standing up from a chair using your arms (e.g., wheelchair or bedside chair)?: A Little Help needed to walk in hospital room?: A Lot Help needed climbing 3-5 steps with a railing? : Total 6 Click Score: 15    End of Session Equipment Utilized During Treatment: Gait belt;Other (comment) (RUE sling) Activity Tolerance: Patient limited by fatigue;Patient tolerated treatment well;Patient limited by pain Patient left: with call bell/phone within reach;with family/visitor present;in bed;with bed alarm set (x3 visitors arriving to room at end of session) Nurse Communication: Mobility status;Patient requests pain meds PT Visit Diagnosis: Unsteadiness on feet (R26.81);Other abnormalities of gait and mobility (R26.89);Difficulty in walking, not elsewhere classified (R26.2);Pain Pain - Right/Left: Right Pain - part of body: Shoulder;Arm     Time: 8293-8282 PT Time Calculation (min) (ACUTE ONLY): 11 min  Charges:    $Gait Training: 8-22 mins $Therapeutic Activity: 8-22 mins PT General Charges $$ ACUTE PT VISIT: 1 Visit                     Jaleeya Mcnelly P., PTA Acute Rehabilitation Services Secure Chat Preferred 9a-5:30pm  Office: [phone]    Connell HERO Kennon Encinas 06/01/2024, 6:22 PM

## 2024-06-01 NOTE — Progress Notes (Signed)
 Inpatient Rehab Coordinator Note:  I spoke with patients mom, [name], over the phone to discuss CIR recommendations and goals/expectations of CIR stay.  We reviewed 3 hrs/day of therapy, physician follow up, and average length of stay 2 weeks (dependent upon progress) with goals of supervision to mod I.  We discussed how much pt has improved since IMN of his R humerus.  Plan will be for patient to discharge home to his aunt's home.  [name] feels that as long as pt doesn't require physical assistance and can walk he will have adequate caregiver support; I think its reasonable to expect him to reach a fairly independent level for basic mobility/adls based on his current level of function. I will f/u on whether insurance is a barrier to DME.  She notes they are trying to switch his [facility] tailored plan to another Medicaid plan for better access to care and I will see if I can figure out who needs to f/u with her for that.  I will continue to follow.  Auth for CIR is pending.   Reche Lowers, PT, DPT Admissions Coordinator [phone] 06/01/24 1:42 PM

## 2024-06-01 NOTE — Progress Notes (Signed)
 Occupational Therapy Treatment Patient Details Name: Joseph [name] MRN: [id number] DOB: 30-Oct-1980 Today's Date: 06/01/2024   History of present illness Pt is a 43 y.o. male presenting 11/28 from [facility] with R shoulder pain; found to have R humeral pathologic fx. S/p IM rod placement 11/29. 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 At CIR 11/24-11/28. PMH: cocaine use disorder, cannibis use disorder.   OT comments  Pt progressing well towards OT established goals. Focus of session on progressing functional mobility and increasing engagement in ADL tasks. Reinforced education on RUE HEP for hand, wrist, elbow ROM. Pt tolerated exercises well. Pt required up to Mod A for functional transfers and Max A for LB tasks completed this session. Pt continues to benefit from skilled OT services in acute and post acute care. Continue per POC.       If plan is discharge home, recommend the following:  A lot of help with walking and/or transfers;Two people to help with walking and/or transfers;A lot of help with bathing/dressing/bathroom;Two people to help with bathing/dressing/bathroom   Equipment Recommendations  Other (comment) (defer next venue)    Recommendations for Other Services      Precautions / Restrictions Precautions Precautions: Fall;Back;Shoulder Type of Shoulder Precautions: elbow/wrist/hand ROM only, WBAT for transfers per op note Shoulder Interventions: Shoulder sling/immobilizer Precaution Booklet Issued: Yes (comment) Recall of Precautions/Restrictions: Intact Precaution/Restrictions Comments: painful R shoulder, present prior to admission but pt states worse now Required Braces or Orthoses: Sling Restrictions Weight Bearing Restrictions Per Provider Order: Yes RUE Weight  Bearing Per Provider Order: Weight bearing as tolerated RLE Weight Bearing Per Provider Order: Weight bearing as tolerated LLE Weight Bearing Per Provider Order: Weight bearing as tolerated Other Position/Activity Restrictions: Per Dr. Dozier We will allow weightbearing to tolerance given his lower extremity paraplegia and need for his upper extremities       Mobility Bed Mobility Overal bed mobility: Needs Assistance Bed Mobility: Supine to Sit, Sit to Supine     Supine to sit: Supervision, HOB elevated, Used rails Sit to supine: Mod assist, Used rails   General bed mobility comments: Supervision for bed mobility to sit EOB on L side. Increased time required and verbal cues to scoot towards EOB for feet flat on floor. Pt required Mod A to return to supine, management of BLE. Mod A to reposition in bed with HOB lowered.    Transfers Overall transfer level: Needs assistance Equipment used: Right platform walker Transfers: Sit to/from Stand Sit to Stand: Min assist, From elevated surface           General transfer comment: Min A to rise from bed. Explicit verbal cues for proper hand placement on platform walker when rising and lowering. Pt initially unsteady on power up, requring increased Min A but able to stabilize with increased time. Pt took three side steps to L. Verbal cues for hand placement when lowering onto bed.     Balance Overall balance assessment: History of Falls, Needs assistance Sitting-balance support: Feet supported, No upper extremity supported Sitting balance-Leahy Scale: Good     Standing balance support: Bilateral upper extremity supported, During functional activity, Reliant on assistive device for balance Standing balance-Leahy Scale: Poor Standing balance comment: Dependent on platform walker and external support  ADL either performed or assessed with clinical judgement   ADL Overall ADL's : Needs  assistance/impaired     Grooming: Oral care;Wash/dry face;Set up;Sitting   Upper Body Bathing: Moderate assistance;Sitting Upper Body Bathing Details (indicate cue type and reason): Mod A for posterior bathing         Lower Body Dressing: Maximal assistance;Bed level Lower Body Dressing Details (indicate cue type and reason): max A don socks bed level                    Extremity/Trunk Assessment Upper Extremity Assessment Upper Extremity Assessment: Right hand dominant;RUE deficits/detail RUE Deficits / Details: elbow/wrist/hand ROM WFL. RUE Coordination: decreased gross motor            Vision   Vision Assessment?: No apparent visual deficits   Perception     Praxis     Communication Communication Communication: No apparent difficulties   Cognition Arousal: Alert Behavior During Therapy: WFL for tasks assessed/performed Cognition: No apparent impairments                               Following commands: Intact        Cueing   Cueing Techniques: Verbal cues, Visual cues  Exercises Exercises: General Upper Extremity, Hand exercises General Exercises - Upper Extremity Elbow Flexion: AROM, Right, 10 reps Elbow Extension: AROM, Right, 10 reps Wrist Flexion: AROM, Right, 10 reps Wrist Extension: AROM, Right, 10 reps Digit Composite Flexion: AROM, Right, 10 reps Hand Exercises Forearm Supination: AROM, Right, 10 reps Forearm Pronation: AROM, Right, 10 reps    Shoulder Instructions       General Comments Pt demonstrating improved recall of precautions and RUE HEP. Sling donned for comfort at end of session.    Pertinent Vitals/ Pain       Pain Assessment Pain Assessment: Faces Faces Pain Scale: Hurts a little bit Pain Location: R shoulder Pain Descriptors / Indicators: Discomfort, Guarding Pain Intervention(s): Limited activity within patient's tolerance, Monitored during session, Repositioned, Other (comment) (sling donned)  Home  Living                                          Prior Functioning/Environment              Frequency  Min 2X/week        Progress Toward Goals  OT Goals(current goals can now be found in the care plan section)  Progress towards OT goals: Progressing toward goals  Acute Rehab OT Goals Patient Stated Goal: To get back to rehab OT Goal Formulation: With patient Time For Goal Achievement: 06/05/24 Potential to Achieve Goals: Good ADL Goals Pt Will Perform Grooming: with modified independence;sitting Pt Will Perform Upper Body Bathing: with min assist;sitting Pt Will Transfer to Toilet: with min assist;with +2 assist;stand pivot transfer Pt Will Perform Toileting - Clothing Manipulation and hygiene: with mod assist;sitting/lateral leans;sit to/from stand Pt/caregiver will Perform Home Exercise Program: Right Upper extremity;With written HEP provided Additional ADL Goal #1: Pt to complete bed mobility with Min A in prep for EOB/OOB ADLs  Plan      Co-evaluation                 AM-PAC OT 6 Clicks Daily Activity     Outcome Measure   Help from another person eating meals?:  None Help from another person taking care of personal grooming?: A Little Help from another person toileting, which includes using toliet, bedpan, or urinal?: Total Help from another person bathing (including washing, rinsing, drying)?: A Lot Help from another person to put on and taking off regular upper body clothing?: A Lot Help from another person to put on and taking off regular lower body clothing?: Total 6 Click Score: 13    End of Session Equipment Utilized During Treatment: Other (comment) (R platform walker)  OT Visit Diagnosis: Unsteadiness on feet (R26.81);Other abnormalities of gait and mobility (R26.89);Muscle weakness (generalized) (M62.81);History of falling (Z91.81);Pain Pain - Right/Left: Right Pain - part of body: Shoulder   Activity Tolerance Patient  tolerated treatment well   Patient Left in bed;with call bell/phone within reach;with family/visitor present   Nurse Communication Mobility status        Time: 8981-8961 OT Time Calculation (min): 20 min  Charges: OT General Charges $OT Visit: 1 Visit OT Treatments $Self Care/Home Management : 8-22 mins  Maurilio CROME, OTR/L.  [facility] Acute Rehabilitation  Office: [phone]   Maurilio PARAS Lenni Reckner 06/01/2024, 11:04 AM

## 2024-06-01 NOTE — Progress Notes (Signed)
 Physical Therapy Treatment Patient Details Name: Joseph [name] MRN: [id number] DOB: May 30, 1981 Today's Date: 06/01/2024   History of Present Illness Pt is a 43 y.o. male presenting 11/28 from CIR with R shoulder pain; found to have R humeral pathologic fx. S/p IM rod placement 11/29. 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 At CIR 11/24-11/28. PMH: cocaine use disorder, cannibis use disorder.    PT Comments  Pt received in supine, pleasantly agreeable to therapy session and with good participation and improved tolerance for transfer and gait training this session, pt able to perform short household distance gait trial with CGA and chair follow for safety, as he fatigues in hallway prior to return to room. Pt more than doubled gait distance this session, he expresses moderate to severe RUE pain with activity. Air cushion placed in his chair for pressure relief. Patient will benefit from intensive inpatient follow-up therapy, >3 hours/day.    If plan is discharge home, recommend the following: Two people to help with walking and/or transfers;Two people to help with bathing/dressing/bathroom;Assistance with cooking/housework;Assist for transportation;Help with stairs or ramp for entrance   Can travel by private vehicle        Equipment Recommendations  Other (comment) (TBD)    Recommendations for Other Services       Precautions / Restrictions Precautions Precautions: Fall;Back;Shoulder Type of Shoulder Precautions: elbow/wrist/hand ROM only, WBAT for transfers per op note Shoulder Interventions: Shoulder sling/immobilizer Precaution Booklet Issued: Yes (comment) Recall of Precautions/Restrictions: Intact Precaution/Restrictions Comments: painful R shoulder, present prior to  admission but pt states worse now Required Braces or Orthoses: Sling Restrictions Weight Bearing Restrictions Per Provider Order: Yes RUE Weight Bearing Per Provider Order: Weight bearing as tolerated RLE Weight Bearing Per Provider Order: Weight bearing as tolerated LLE Weight Bearing Per Provider Order: Weight bearing as tolerated Other Position/Activity Restrictions: Per Dr. Dozier We will allow weightbearing to tolerance given his lower extremity paraplegia and need for his upper extremities     Mobility  Bed Mobility Overal bed mobility: Needs Assistance Bed Mobility: Rolling, Sidelying to Sit Rolling: Supervision Sidelying to sit: Supervision       General bed mobility comments: Supervision for bed mobility to sit EOB on L side.    Transfers Overall transfer level: Needs assistance Equipment used: Right platform walker Transfers: Sit to/from Stand Sit to Stand: Min assist, From elevated surface, +2 safety/equipment           General transfer comment: from air bed> R PFRW wtih CGA to light minA, and from PFRW to chair with minA and slightly decreased eccentric control to sit when fatigued.    Ambulation/Gait Ambulation/Gait assistance: Contact guard assist, +2 safety/equipment Gait Distance (Feet): 40 Feet Assistive device: Right platform walker Gait Pattern/deviations: Step-through pattern, Decreased stride length, Narrow base of support Gait velocity: decr     General Gait Details: Pt presents with decreased foot clearance and requires up to CGA with gait. Chair pulled up behind him as he fatigued, pt able to report need to rest without buckling this date. Pt considered second gait trial, but with 6/10 modified RPE and having worked with OT earlier in the day, pt requesting to rest in chair instead of additional trials.   Stairs             Psychologist, [facility]  Tilt Bed    Modified Rankin (Stroke Patients Only)       Balance Overall  balance assessment: History of Falls, Needs assistance Sitting-balance support: Feet supported, No upper extremity supported Sitting balance-Leahy Scale: Good     Standing balance support: Bilateral upper extremity supported, During functional activity, Reliant on assistive device for balance Standing balance-Leahy Scale: Poor Standing balance comment: Dependent on platform walker and external support                            Communication Communication Communication: No apparent difficulties  Cognition Arousal: Alert Behavior During Therapy: WFL for tasks assessed/performed   PT - Cognitive impairments: No apparent impairments                         Following commands: Intact      Cueing Cueing Techniques: Verbal cues, Visual cues  Exercises General Exercises - Lower Extremity Ankle Circles/Pumps: AROM, Both, 5 reps, Supine    General Comments General comments (skin integrity, edema, etc.): sling donned throughout for RUE comfort, pt compliant with RUE no shoulder ROM      Pertinent Vitals/Pain Pain Assessment Pain Assessment: 0-10 Pain Score: 7  Pain Location: R arm and shoulder, only mild in my  back Pain Descriptors / Indicators: Discomfort, Guarding, Grimacing Pain Intervention(s): Limited activity within patient's tolerance, Monitored during session, Premedicated before session, Repositioned    Home Living                          Prior Function            PT Goals (current goals can now be found in the care plan section) Acute Rehab PT Goals Patient Stated Goal: progress mobility PT Goal Formulation: With patient Time For Goal Achievement: 06/05/24 Progress towards PT goals: Progressing toward goals    Frequency    Min 3X/week      PT Plan      Co-evaluation              AM-PAC PT 6 Clicks Mobility   Outcome Measure  Help needed turning from your back to your side while in a flat bed without using  bedrails?: A Little Help needed moving from lying on your back to sitting on the side of a flat bed without using bedrails?: A Little Help needed moving to and from a bed to a chair (including a wheelchair)?: A Little Help needed standing up from a chair using your arms (e.g., wheelchair or bedside chair)?: A Little Help needed to walk in hospital room?: A Lot Help needed climbing 3-5 steps with a railing? : Total 6 Click Score: 15    End of Session Equipment Utilized During Treatment: Gait belt;Other (comment) (RUE sling) Activity Tolerance: Patient tolerated treatment well Patient left: in chair;with call bell/phone within reach;with chair alarm set;with family/visitor present (father visiting him) Nurse Communication: Mobility status;Patient requests pain meds PT Visit Diagnosis: Unsteadiness on feet (R26.81);Other abnormalities of gait and mobility (R26.89);Difficulty in walking, not elsewhere classified (R26.2);Pain Pain - Right/Left: Right Pain - part of body: Shoulder;Arm     Time: 8478-8458 PT Time Calculation (min) (ACUTE ONLY): 20 min  Charges:    $Gait Training: 8-22 mins PT General Charges $$ ACUTE PT VISIT: 1 Visit  Connell SQUIBB., PTA Acute Rehabilitation Services Secure Chat Preferred 9a-5:30pm Office: [phone]    Connell HERO [facility] 06/01/2024, 3:53 PM

## 2024-06-02 ENCOUNTER — Inpatient Hospital Stay (HOSPITAL_COMMUNITY)
Admission: AD | Admit: 2024-06-02 | Discharge: 2024-06-21 | DRG: 052 | Disposition: A | Discharge status: Home / Self Care | Payer: MEDICAID | Source: Intra-hospital | Attending: Physical Medicine and Rehabilitation | Admitting: Physical Medicine and Rehabilitation

## 2024-06-02 ENCOUNTER — Ambulatory Visit: Payer: MEDICAID

## 2024-06-02 ENCOUNTER — Other Ambulatory Visit: Payer: Self-pay

## 2024-06-02 DIAGNOSIS — Z811 Family history of alcohol abuse and dependence: Secondary | ICD-10-CM

## 2024-06-02 DIAGNOSIS — I272 Pulmonary hypertension, unspecified: Secondary | ICD-10-CM | POA: Diagnosis present

## 2024-06-02 DIAGNOSIS — F32A Depression, unspecified: Secondary | ICD-10-CM | POA: Diagnosis present

## 2024-06-02 DIAGNOSIS — M25511 Pain in right shoulder: Secondary | ICD-10-CM | POA: Diagnosis not present

## 2024-06-02 DIAGNOSIS — Z6841 Body Mass Index (BMI) 40.0 and over, adult: Secondary | ICD-10-CM | POA: Diagnosis not present

## 2024-06-02 DIAGNOSIS — F17210 Nicotine dependence, cigarettes, uncomplicated: Secondary | ICD-10-CM | POA: Diagnosis present

## 2024-06-02 DIAGNOSIS — C7801 Secondary malignant neoplasm of right lung: Secondary | ICD-10-CM | POA: Diagnosis present

## 2024-06-02 DIAGNOSIS — I2699 Other pulmonary embolism without acute cor pulmonale: Secondary | ICD-10-CM | POA: Diagnosis present

## 2024-06-02 DIAGNOSIS — Z510 Encounter for antineoplastic radiation therapy: Secondary | ICD-10-CM | POA: Diagnosis present

## 2024-06-02 DIAGNOSIS — M8448XA Pathological fracture, other site, initial encounter for fracture: Secondary | ICD-10-CM | POA: Diagnosis present

## 2024-06-02 DIAGNOSIS — K592 Neurogenic bowel, not elsewhere classified: Secondary | ICD-10-CM | POA: Diagnosis present

## 2024-06-02 DIAGNOSIS — R918 Other nonspecific abnormal finding of lung field: Secondary | ICD-10-CM | POA: Diagnosis present

## 2024-06-02 DIAGNOSIS — Z515 Encounter for palliative care: Secondary | ICD-10-CM

## 2024-06-02 DIAGNOSIS — Z981 Arthrodesis status: Secondary | ICD-10-CM | POA: Diagnosis not present

## 2024-06-02 DIAGNOSIS — R600 Localized edema: Secondary | ICD-10-CM | POA: Diagnosis not present

## 2024-06-02 DIAGNOSIS — S24101A Unspecified injury at T1 level of thoracic spinal cord, initial encounter: Secondary | ICD-10-CM | POA: Diagnosis present

## 2024-06-02 DIAGNOSIS — F418 Other specified anxiety disorders: Secondary | ICD-10-CM | POA: Diagnosis not present

## 2024-06-02 DIAGNOSIS — C73 Malignant neoplasm of thyroid gland: Secondary | ICD-10-CM | POA: Diagnosis present

## 2024-06-02 DIAGNOSIS — G8222 Paraplegia, incomplete: Principal | ICD-10-CM | POA: Diagnosis present

## 2024-06-02 DIAGNOSIS — R270 Ataxia, unspecified: Secondary | ICD-10-CM

## 2024-06-02 DIAGNOSIS — R52 Pain, unspecified: Secondary | ICD-10-CM | POA: Diagnosis not present

## 2024-06-02 DIAGNOSIS — J45909 Unspecified asthma, uncomplicated: Secondary | ICD-10-CM | POA: Diagnosis present

## 2024-06-02 DIAGNOSIS — F4320 Adjustment disorder, unspecified: Secondary | ICD-10-CM | POA: Diagnosis present

## 2024-06-02 DIAGNOSIS — M792 Neuralgia and neuritis, unspecified: Secondary | ICD-10-CM | POA: Diagnosis not present

## 2024-06-02 DIAGNOSIS — C7951 Secondary malignant neoplasm of bone: Secondary | ICD-10-CM | POA: Diagnosis present

## 2024-06-02 DIAGNOSIS — M545 Low back pain, unspecified: Secondary | ICD-10-CM | POA: Diagnosis present

## 2024-06-02 DIAGNOSIS — D696 Thrombocytopenia, unspecified: Secondary | ICD-10-CM | POA: Diagnosis present

## 2024-06-02 DIAGNOSIS — Z923 Personal history of irradiation: Secondary | ICD-10-CM

## 2024-06-02 DIAGNOSIS — F4329 Adjustment disorder with other symptoms: Secondary | ICD-10-CM | POA: Diagnosis not present

## 2024-06-02 DIAGNOSIS — K5900 Constipation, unspecified: Secondary | ICD-10-CM | POA: Diagnosis present

## 2024-06-02 DIAGNOSIS — M84521D Pathological fracture in neoplastic disease, right humerus, subsequent encounter for fracture with routine healing: Secondary | ICD-10-CM | POA: Diagnosis present

## 2024-06-02 DIAGNOSIS — C7802 Secondary malignant neoplasm of left lung: Secondary | ICD-10-CM | POA: Diagnosis present

## 2024-06-02 DIAGNOSIS — Z79899 Other long term (current) drug therapy: Secondary | ICD-10-CM | POA: Diagnosis not present

## 2024-06-02 DIAGNOSIS — G893 Neoplasm related pain (acute) (chronic): Secondary | ICD-10-CM | POA: Diagnosis present

## 2024-06-02 DIAGNOSIS — D649 Anemia, unspecified: Secondary | ICD-10-CM | POA: Diagnosis present

## 2024-06-02 DIAGNOSIS — S22029S Unspecified fracture of second thoracic vertebra, sequela: Secondary | ICD-10-CM | POA: Diagnosis not present

## 2024-06-02 DIAGNOSIS — K5901 Slow transit constipation: Secondary | ICD-10-CM | POA: Diagnosis not present

## 2024-06-02 DIAGNOSIS — N319 Neuromuscular dysfunction of bladder, unspecified: Secondary | ICD-10-CM | POA: Diagnosis present

## 2024-06-02 DIAGNOSIS — D630 Anemia in neoplastic disease: Secondary | ICD-10-CM | POA: Diagnosis present

## 2024-06-02 DIAGNOSIS — Z86711 Personal history of pulmonary embolism: Secondary | ICD-10-CM

## 2024-06-02 DIAGNOSIS — E041 Nontoxic single thyroid nodule: Secondary | ICD-10-CM | POA: Diagnosis present

## 2024-06-02 DIAGNOSIS — M8458XD Pathological fracture in neoplastic disease, other specified site, subsequent encounter for fracture with routine healing: Secondary | ICD-10-CM | POA: Diagnosis present

## 2024-06-02 DIAGNOSIS — Z85850 Personal history of malignant neoplasm of thyroid: Secondary | ICD-10-CM

## 2024-06-02 DIAGNOSIS — Z7901 Long term (current) use of anticoagulants: Secondary | ICD-10-CM | POA: Diagnosis not present

## 2024-06-02 DIAGNOSIS — F411 Generalized anxiety disorder: Secondary | ICD-10-CM | POA: Diagnosis present

## 2024-06-02 DIAGNOSIS — D509 Iron deficiency anemia, unspecified: Secondary | ICD-10-CM | POA: Diagnosis present

## 2024-06-02 DIAGNOSIS — M84521A Pathological fracture in neoplastic disease, right humerus, initial encounter for fracture: Secondary | ICD-10-CM

## 2024-06-02 DIAGNOSIS — M84421A Pathological fracture, right humerus, initial encounter for fracture: Secondary | ICD-10-CM | POA: Diagnosis present

## 2024-06-02 LAB — CBC
HCT: 32.5 % — ABNORMAL LOW (ref 39.0–52.0)
Hemoglobin: 9.9 g/dL — ABNORMAL LOW (ref 13.0–17.0)
MCH: 28.7 pg (ref 26.0–34.0)
MCHC: 30.5 g/dL (ref 30.0–36.0)
MCV: 94.2 fL (ref 80.0–100.0)
Platelets: 94 K/uL — ABNORMAL LOW (ref 150–400)
RBC: 3.45 MIL/uL — ABNORMAL LOW (ref 4.22–5.81)
RDW: 13.8 % (ref 11.5–15.5)
WBC: 7 K/uL (ref 4.0–10.5)
nRBC: 0 % (ref 0.0–0.2)

## 2024-06-02 LAB — RAD ONC ARIA SESSION SUMMARY
Course Elapsed Days: 0
Plan Fractions Treated to Date: 1
Plan Prescribed Dose Per Fraction: 3 Gy
Plan Total Fractions Prescribed: 10
Plan Total Prescribed Dose: 30 Gy
Reference Point Dosage Given to Date: 3 Gy
Reference Point Session Dosage Given: 3 Gy
Session Number: 1

## 2024-06-02 LAB — BASIC METABOLIC PANEL WITH GFR
Anion gap: 8 (ref 5–15)
BUN: 11 mg/dL (ref 6–20)
CO2: 26 mmol/L (ref 22–32)
Calcium: 8.3 mg/dL — ABNORMAL LOW (ref 8.9–10.3)
Chloride: 101 mmol/L (ref 98–111)
Creatinine, Ser: 0.6 mg/dL — ABNORMAL LOW (ref 0.61–1.24)
GFR, Estimated: >60 mL/min (ref >60)
Glucose, Bld: 123 mg/dL — ABNORMAL HIGH (ref 70–99)
Potassium: 3.9 mmol/L (ref 3.5–5.1)
Sodium: 135 mmol/L (ref 135–145)

## 2024-06-02 MED ORDER — ADULT MULTIVITAMIN W/MINERALS CH
1.0000 | ORAL_TABLET | Freq: Every day | ORAL | Status: DC
  Administered 2024-06-03 – 2024-06-21 (×19): 1 via ORAL
  Filled 2024-06-02 (×17): qty 1

## 2024-06-02 MED ORDER — SENNOSIDES-DOCUSATE SODIUM 8.6-50 MG PO TABS
2.0000 | ORAL_TABLET | Freq: Two times a day (BID) | ORAL | Status: DC
  Administered 2024-06-04 – 2024-06-21 (×25): 2 via ORAL
  Filled 2024-06-02 (×35): qty 2

## 2024-06-02 MED ORDER — ALBUTEROL SULFATE (2.5 MG/3ML) 0.083% IN NEBU: 2.5000 mg | INHALATION_SOLUTION | Freq: Four times a day (QID) | RESPIRATORY_TRACT | Status: DC | PRN

## 2024-06-02 MED ORDER — GUAIFENESIN ER 600 MG PO TB12
1200.0000 mg | ORAL_TABLET | Freq: Two times a day (BID) | ORAL | Status: DC
  Administered 2024-06-02 – 2024-06-21 (×38): 1200 mg via ORAL
  Filled 2024-06-02 (×36): qty 2

## 2024-06-02 MED ORDER — ACETAMINOPHEN 325 MG PO TABS
650.0000 mg | ORAL_TABLET | Freq: Four times a day (QID) | ORAL | Status: DC | PRN
  Administered 2024-06-03: 650 mg via ORAL
  Filled 2024-06-02: qty 2

## 2024-06-02 MED ORDER — ALUM & MAG HYDROXIDE-SIMETH 200-200-20 MG/5ML PO SUSP: 30.0000 mL | ORAL | Status: DC | PRN

## 2024-06-02 MED ORDER — DIPHENHYDRAMINE HCL 25 MG PO CAPS
25.0000 mg | ORAL_CAPSULE | Freq: Four times a day (QID) | ORAL | Status: DC | PRN
  Administered 2024-06-02: 25 mg via ORAL
  Filled 2024-06-02: qty 1

## 2024-06-02 MED ORDER — BISACODYL 10 MG RE SUPP
10.0000 mg | Freq: Every day | RECTAL | Status: DC | PRN
  Administered 2024-06-05: 10 mg via RECTAL
  Filled 2024-06-02: qty 1

## 2024-06-02 MED ORDER — OXYCODONE HCL 5 MG PO TABS
30.0000 mg | ORAL_TABLET | ORAL | Status: DC | PRN
  Administered 2024-06-02 – 2024-06-10 (×41): 30 mg via ORAL
  Filled 2024-06-02 (×43): qty 6

## 2024-06-02 MED ORDER — ENSURE MAX PROTEIN PO LIQD
11.0000 [oz_av] | Freq: Two times a day (BID) | ORAL | Status: DC
  Administered 2024-06-02 – 2024-06-06 (×8): 11 [oz_av] via ORAL
  Administered 2024-06-07: 21:00:00 237 mL via ORAL
  Administered 2024-06-08 – 2024-06-21 (×21): 11 [oz_av] via ORAL

## 2024-06-02 MED ORDER — POLYETHYLENE GLYCOL 3350 17 G PO PACK
17.0000 g | PACK | Freq: Every day | ORAL | Status: DC
  Administered 2024-06-06 – 2024-06-21 (×9): 17 g via ORAL
  Filled 2024-06-02 (×17): qty 1

## 2024-06-02 MED ORDER — FLEET ENEMA RE ENEM: 1.0000 | ENEMA | Freq: Once | RECTAL | Status: DC | PRN

## 2024-06-02 MED ORDER — ONDANSETRON HCL 4 MG PO TABS: 4.0000 mg | ORAL_TABLET | Freq: Four times a day (QID) | ORAL | Status: DC | PRN

## 2024-06-02 MED ORDER — ACETAMINOPHEN 650 MG RE SUPP: 650.0000 mg | Freq: Four times a day (QID) | RECTAL | Status: DC | PRN

## 2024-06-02 MED ORDER — LIDOCAINE 5 % EX PTCH
1.0000 | MEDICATED_PATCH | Freq: Every day | CUTANEOUS | Status: DC
  Administered 2024-06-07 – 2024-06-08 (×2): 1 via TRANSDERMAL
  Filled 2024-06-02 (×6): qty 1

## 2024-06-02 MED ORDER — PREGABALIN 50 MG PO CAPS
50.0000 mg | ORAL_CAPSULE | Freq: Three times a day (TID) | ORAL | Status: DC
  Administered 2024-06-02 – 2024-06-21 (×56): 50 mg via ORAL
  Filled 2024-06-02 (×52): qty 1

## 2024-06-02 MED ORDER — METHOCARBAMOL 750 MG PO TABS
750.0000 mg | ORAL_TABLET | Freq: Three times a day (TID) | ORAL | Status: DC
  Administered 2024-06-02 – 2024-06-08 (×22): 750 mg via ORAL
  Filled 2024-06-02 (×22): qty 1

## 2024-06-02 MED ORDER — APIXABAN 5 MG PO TABS
5.0000 mg | ORAL_TABLET | Freq: Two times a day (BID) | ORAL | Status: DC
  Administered 2024-06-02 – 2024-06-21 (×38): 5 mg via ORAL
  Filled 2024-06-02 (×10): qty 1
  Filled 2024-06-02: qty 2
  Filled 2024-06-02 (×24): qty 1

## 2024-06-02 MED ORDER — ONDANSETRON HCL 4 MG/2ML IJ SOLN: 4.0000 mg | Freq: Four times a day (QID) | INTRAMUSCULAR | Status: DC | PRN

## 2024-06-02 MED ORDER — CYCLOBENZAPRINE HCL 10 MG PO TABS
10.0000 mg | ORAL_TABLET | Freq: Three times a day (TID) | ORAL | Status: DC
  Administered 2024-06-02 – 2024-06-08 (×17): 10 mg via ORAL
  Filled 2024-06-02 (×17): qty 1

## 2024-06-02 MED ORDER — METHADONE HCL 10 MG PO TABS
10.0000 mg | ORAL_TABLET | Freq: Three times a day (TID) | ORAL | Status: DC
  Administered 2024-06-02 – 2024-06-07 (×14): 10 mg via ORAL
  Filled 2024-06-02 (×14): qty 1

## 2024-06-02 MED ORDER — POLYSACCHARIDE IRON COMPLEX 150 MG PO CAPS
150.0000 mg | ORAL_CAPSULE | Freq: Every day | ORAL | Status: DC
  Administered 2024-06-03 – 2024-06-21 (×19): 150 mg via ORAL
  Filled 2024-06-02 (×17): qty 1

## 2024-06-02 MED ORDER — TRAZODONE HCL 50 MG PO TABS
25.0000 mg | ORAL_TABLET | Freq: Every evening | ORAL | Status: DC | PRN
  Administered 2024-06-02 – 2024-06-19 (×9): 50 mg via ORAL
  Filled 2024-06-02 (×9): qty 1

## 2024-06-02 NOTE — Progress Notes (Signed)
 PT Cancellation Note  Patient Details Name: Joseph [name] MRN: [id number] DOB: 1981/03/01   Cancelled Treatment:    Reason Eval/Treat Not Completed: (P) Patient at procedure or test/unavailable (pt off unit at Radiation Oncology dept.) Will continue efforts per PT plan of care as schedule permits.   Miquel Stacks M Diamond Martucci 06/02/2024, 9:45 AM

## 2024-06-02 NOTE — Plan of Care (Signed)

## 2024-06-02 NOTE — Progress Notes (Signed)
 Joseph [name]   DOB:1981/03/01   FM#:[id number]    ASSESSMENT & PLAN:  Joseph [name] is a 43 y.o. male with a history of asthma, polysubstance abuse, metastatic papillary thyroid  cancer initially presented with unstable pathologic T2 fracture, high-grade epidural spinal cord compression due to extradural metastatic tumor with pathology showed papillary thyroid  carcinoma. Patient was discharged to rehab but presented from acute inpatient rehabilitation secondary to right shoulder pain and found to have evidence of a pathologic right proximal humerus fracture. Orthopedic surgery consulted for surgical repair. ENT consulted while admitted and pending recommendation. Plan for discharge back to acute inpatient rehabilitation.   Clinically, patient is getting stronger, and reports may be discharged to rehab today.  Discussed general approach to metastatic PTC today.  Discussed thyroidectomy is in general indicated in metastatic setting.  Afterwards, evaluation for eligibility of RAI and follow-up with endocrinology for long-term management.  Will also obtain NGS.  This may be useful in the future if refractory to RAI, or not eligible for it.  I also spoke to patient's mother earlier this week regarding the plan at her request.  Assessment & Plan Pathological fracture of right humerus surgical pinning of the right humerus on 05/21/24  Plan for postoperative radiation by radiation oncology Pulmonary embolism (HCC) Continue apixaban  twice daily Bleeding precautions Metastatic cancer to spine (HCC) 05/05/24: Posterior T1-T3 instrumentation and fusion, T1-T3 laminectomy, T2 corpectomy for resection of metastatic tumor  Pathology showed papillary thyroid  carcinoma Papillary thyroid  carcinoma (HCC) Stage IVC, [id number] Pending evaluation for timing thyroidectomy Will need follow-up with endocrinology.  After thyroidectomy evaluation with endocrinology for RAI.  I have communicated and placed an outpatient  referral to endocrinology. Will request NGS for future references Malignant neoplasm metastatic to both lungs [facility]) Evaluation for RAI by endocrinology after thyroidectomy Normocytic anemia Secondary to malignancy and recent surgery   Discharge planning Planning on inpatient rehab.  All questions were answered.  Thank you for the consult.  Please call us  if any questions.  [name], MD 06/02/2024 7:47 AM  Subjective:  Joseph [name] reports feeling better slowly each day.  Strength is improving.  Lower extremity strength is improving in general.  He denies any difficulty swallowing, shortness of breath, palpable mass himself.  No active chest pain.  Objective:  Vitals:   06/01/24 1934 06/02/24 0252  BP: (!) 143/88 114/74  Pulse: (!) 104 98  Resp: 17 17  Temp: 99.3 F (37.4 C) 98.4 F (36.9 C)  SpO2: 93% 100%     Intake/Output Summary (Last 24 hours) at 06/02/2024 0747 Last data filed at 06/02/2024 0037 Gross per 24 hour  Intake 960 ml  Output 2175 ml  Net -1215 ml    GENERAL: alert, no distress and comfortable SKIN: skin color normal EYES: sclera clear NECK: supple, palpable mass over the left neck LYMPH:  no palpable cervical, axillary lymphadenopathy LUNGS: normal breathing effort.  Musculoskeletal: no lower extremity edema NEURO: alert with fluent speech, no lower extremity strength equal bilaterally   Labs:  Recent Labs    04/28/24 0000 04/29/24 0516 05/22/24 0201 05/22/24 2253 05/23/24 0552 05/24/24 0435 05/25/24 0425 06/02/24 0404  NA  --    < >  --    < > 137 137 139 135  K  --    < >  --    < > 4.3 4.2 4.0 3.9  CL  --    < >  --    < > 100 102 100 101  CO2  --    < >  --    < > 30 27 27 26   GLUCOSE  --    < >  --    < > 111* 113* 114* 123*  BUN  --    < >  --    < > 12 12 9 11   CREATININE  --    < >  --    < > 0.71 0.74 0.67 0.60*  CALCIUM  --    < >  --    < > 8.5* 8.4* 8.7* 8.3*  GFRNONAA  --    < >  --    < > >60 >60 >60 >60  PROT 7.9    < > 6.4*  --  6.5 6.4* 6.4*  --   ALBUMIN  3.7   < > 2.3*  --  2.3* 2.2* 2.3*  --   AST 28   < > 23  --  18 22 19   --   ALT 43   < > 37  --  37 36 36  --   ALKPHOS 98   < > 101  --  102 93 95  --   BILITOT 0.4   < > 0.3  --  0.6 0.4 0.4  --   BILIDIR 0.2  --  0.1  --   --   --   --   --   IBILI 0.3  --  0.2*  --   --   --   --   --    < > = values in this interval not displayed.    Studies:  CT SOFT TISSUE NECK W CONTRAST Result Date: 05/26/2024 EXAM: CT NECK WITH CONTRAST 05/25/2024 03:14:00 PM TECHNIQUE: CT of the neck was performed with the administration of 75 mL of iohexol  (OMNIPAQUE ) 350 MG/ML injection. Multiplanar reformatted images are provided for review. Automated exposure control, iterative reconstruction, and/or weight based adjustment of the mA/kV was utilized to reduce the radiation dose to as low as reasonably achievable. COMPARISON: None available. CLINICAL HISTORY: Thyroid  cancer. FINDINGS: AERODIGESTIVE TRACT: No discrete mass. No edema. SALIVARY GLANDS: The parotid and submandibular glands are unremarkable. THYROID : Markedly enlarged left lobe of the thyroid  gland, which has been previously sampled by FNA. LYMPH NODES: No suspicious cervical lymphadenopathy. SOFT TISSUES: No mass or fluid collection. BRAIN, ORBITS, SINUSES AND MASTOIDS: No acute abnormality. LUNGS AND MEDIASTINUM: No acute abnormality. BONES: Posterior fusion hardware of the upper thoracic spine. There is a lucent lesion within the C6 vertebra replacing most of the normal bone marrow. Smaller lesion noted within the dorsal C5 vertebral body. No current evidence of pathologic fracture. IMPRESSION: 1. Markedly enlarged left thyroid  lobe, previously sampled by FNA. 2. C5 and C6 vertebral body metastases without pathologic fracture. 3. No cervical lymphadenopathy. Electronically signed by: Franky Stanford MD 05/26/2024 12:37 AM EST RP Workstation: [id number]   DG CHEST PORT 1 VIEW Result Date: 05/22/2024 CLINICAL DATA:   Respiratory compromise EXAM: PORTABLE CHEST 1 VIEW COMPARISON:  Chest x-ray 05/22/2024 FINDINGS: Lung volumes are low. This likely accentuating central pulmonary vascularity. There is no lung consolidation, pleural effusion or pneumothorax. Cardiomediastinal silhouette is within normal limits. Cervical/thoracic spinal fusion hardware is present. No acute fractures are seen. IMPRESSION: Low lung volumes. No acute cardiopulmonary process. Electronically Signed   By: Greig Pique M.D.   On: 05/22/2024 22:52   DG CHEST PORT 1 VIEW Result Date: 05/22/2024 EXAM: 1 VIEW(S) XRAY OF THE CHEST 05/22/2024 03:42:00 PM  COMPARISON: 05/12/2024 CLINICAL HISTORY: Fever FINDINGS: LUNGS AND PLEURA: Low lung volumes. Elevated right hemidiaphragm. Bilateral pulmonary nodules better visualized on prior chest CT. Mild pulmonary vascular congestion is slightly improved from the prior exam. No pleural effusion. No pneumothorax. HEART AND MEDIASTINUM: No acute abnormality of the cardiac and mediastinal silhouettes. BONES AND SOFT TISSUES: Cervicothoracic surgical hardware noted. No acute osseous abnormality. IMPRESSION: 1. Mild pulmonary vascular congestion, slightly improved from the prior exam. 2. Otherwise stable exam Electronically signed by: Lonni Necessary MD 05/22/2024 07:57 PM EST RP Workstation: [id number]   DG Humerus Right Result Date: 05/21/2024 CLINICAL DATA:  Operative imaging from right proximal humerus ORIF. EXAM: RIGHT HUMERUS - 2+ VIEW; DG C-ARM 1-60 MIN-NO REPORT COMPARISON:  05/12/2024. FLUOROSCOPY: Exposure Index (as provided by the fluoroscopic device): 18.84 mGy Kerma FINDINGS: Five submitted images show placement an intramedullary rod humeral head to the distal metadiaphysis. Rod and associated fixation screws are well seated. Proximal pathologic fracture is well aligned. IMPRESSION: 1. Intraoperative fluoroscopy provided for right humeral ORIF. Electronically Signed   By: Alm Parkins M.D.   On:  05/21/2024 18:56   DG C-Arm 1-60 Min-No Report Result Date: 05/21/2024 Fluoroscopy was utilized by the requesting physician.  No radiographic interpretation.   DG C-Arm 1-60 Min-No Report Result Date: 05/21/2024 Fluoroscopy was utilized by the requesting physician.  No radiographic interpretation.   DG Humerus Right Result Date: 05/20/2024 EXAM: 1 VIEW(S) XRAY OF THE RIGHT HUMERUS 05/20/2024 01:46:00 PM COMPARISON: MRI shoulder 05/19/2024 and right shoulder radiograph 05/01/2024. CLINICAL HISTORY: Right shoulder pain. FINDINGS: BONES AND JOINTS: Lytic lesion involving the surgical neck of the right proximal humerus with pathologic fracture. Mild displacement. Periosteal reaction at the fracture site. The glenohumeral and acromioclavicular joints and elbow are approximated. No joint dislocation. SOFT TISSUES: Numerous linear metallic densities in the soft tissues of the right upper arm and distal arm compatible with needle fragments. Soft tissue swelling surrounding the proximal humerus. Areas of pneumatosis in the soft tissues. IMPRESSION: 1. Lytic lesion involving the surgical neck of the right proximal humerus with pathologic fracture. Mild displacement is new since the prior radiograph. 2. Soft tissue swelling surrounding the proximal humerus with areas of soft tissue swelling and periosteal reaction at the fracture site. 3. Numerous linear metallic densities in the soft tissues of the right upper arm and distal arm compatible with needle fragments. Electronically signed by: Donnice Mania MD 05/20/2024 02:06 PM EST RP Workstation: [id number]   MR SHOULDER RIGHT WO CONTRAST Result Date: 05/20/2024 EXAM: MRI OF THE RIGHT SHOULDER WITHOUT CONTRAST 05/19/2024 12:04:39 PM TECHNIQUE: Multiplanar multisequence MRI of the right shoulder was performed without the administration of intravenous contrast. COMPARISON: Radiograph 05/01/2024. CLINICAL HISTORY: Chronic shoulder pain, paraplegia related to  metastatic papillary thyroid  carcinoma to the thoracic spine. FINDINGS: ROTATOR CUFF: Intact supraspinatus, infraspinatus, subscapularis and teres minor tendons. Edema signal in the teres minor muscle. No significant muscle atrophy. BICEPS TENDON: Long head biceps tendon is intact and normally located. LABRUM: No gross labral tear or paralabral cyst; motion artifact precludes sensitive assessment of the labrum. GLENOHUMERAL JOINT: Physiologic amount of joint fluid. No high-grade cartilage loss. Normal alignment. AC JOINT AND ACROMIOCLAVICULAR ARCH: No significant acromial downsloping or subacromial spur. No significant degenerative changes. Intact acromioclavicular and coracoclavicular ligaments. BURSA: No significant subacromial/subdeltoid bursitis. BONE MARROW: Pathologic transverse fracture of the proximal humerus just below the surgical neck with moderate apex anterolateral angulation associated with a 6.0 x 3.7 x 4.1 cm metastatic lesion of the right proximal humerus. The lesion  is mildly expansile and has high T2 and low T1 signal in a relatively homogeneous pattern. OUTLET SPACES: Normal MRI appearance of the quadrilateral space. No significant narrowing of the supraspinatus outlet. SOFT TISSUES: Edema tracks along fascial planes around the fracture site. Edema signal proximally in the long head of the triceps. Nonspecific edema tracks along the superficial fascial margin of the posterolateral deltoid. LIMITATIONS/ARTIFACTS: Motion artifact is present, reducing diagnostic sensitivity and specificity. IMPRESSION: 1. Pathologic transverse fracture of the proximal humerus just below the surgical neck with moderate apex anterolateral angulation, associated with a 6.0 x 3.7 x 4.1 cm metastatic lesion of the right proximal humerus. Edema tracks along fascial planes around the fracture site. 2. Edema signal in the teres minor muscle and proximally in the long head of the triceps. 3. Nonspecific edema along the  superficial fascial margin of the posterolateral deltoid. Electronically signed by: Ryan Salvage MD 05/20/2024 10:36 AM EST RP Workstation: [id number]   DG CHEST PORT 1 VIEW Result Date: 05/12/2024 EXAM: 1 VIEW(S) XRAY OF THE CHEST 05/12/2024 09:52:22 AM COMPARISON: 05/06/2024 CLINICAL HISTORY: Fever FINDINGS: LINES, TUBES AND DEVICES: Left PICC in place with tip overlying superior aspect of SVC. LUNGS AND PLEURA: Small bilateral pulmonary nodules, unchanged from previous exam. . No pleural effusion. No pneumothorax. HEART AND MEDIASTINUM: No acute abnormality of the cardiac and mediastinal silhouettes. BONES AND SOFT TISSUES: Cervicothoracic surgical hardware. No acute osseous abnormality. IMPRESSION: 1. No acute cardiopulmonary pathology. 2. Small bilateral pulmonary nodules are similar to the previous exam, better visualized on CT of the chest from 04/02/2024. Electronically signed by: Waddell Calk MD 05/12/2024 12:40 PM EST RP Workstation: [id number]   US  EKG SITE RITE Result Date: 05/07/2024 If Site Rite image not attached, placement could not be confirmed due to current cardiac rhythm.  DG CHEST PORT 1 VIEW Result Date: 05/06/2024 CLINICAL DATA:  Left PICC line infiltration.  Metastatic disease. EXAM: PORTABLE CHEST 1 VIEW COMPARISON:  05/05/2024. CT chest, abdomen and pelvis dated 05/02/2024. FINDINGS: The left PICC tip is poorly visualized and appears to be in the region of the superior cavoatrial junction. Normal sized heart. Multiple small, faintly visible bilateral pulmonary nodules, similar to the previous CT. Mild-to-moderate peribronchial thickening. Upper thoracic spine laminectomy defect with fixation hardware and corpectomy spacer. IMPRESSION: 1. The left PICC tip is poorly visualized and appears to be in the region of the superior cavoatrial junction. 2. Mild to moderate bronchitic changes. 3. Multiple small, faintly visible bilateral pulmonary nodules, similar to the previous CT,  compatible with possible metastatic disease. Electronically Signed   By: Elspeth Bathe M.D.   On: 05/06/2024 17:17   CT THORACIC SPINE WO CONTRAST Result Date: 05/06/2024 EXAM: CT THORACIC SPINE WITHOUT CONTRAST 05/06/2024 12:01:33 PM TECHNIQUE: CT of the thoracic spine was performed without the administration of intravenous contrast. Multiplanar reformatted images are provided for review. Automated exposure control, iterative reconstruction, and/or weight based adjustment of the mA/kV was utilized to reduce the radiation dose to as low as reasonably achievable. COMPARISON: Preoperative thoracic MRI and CT on 05/01/2024 and 05/02/2024. Postoperative MRI reported on 05/06/2024. Cervical spine CT on 05/02/2024. CLINICAL HISTORY: 43 year old male, status post upper thoracic corpectomy, decompression, and fusion. Pathologic fracture, vertebra plana with enhancing mass-like soft tissue at the T2 thoracic level. Evidence of other metastatic disease on CT chest, abdomen, and pelvis . surgical pathology pending. FINDINGS: Normal thoracic and lumbar vertebral segmentation on CT chest, abdomen, and pelvis. BONES AND ALIGNMENT: Sequelae of T2 corpectomy, posterior decompression at T1-T2  and T2-T3, and posterior fusion hardware in place with pedicle screws bilaterally at T1 and T3. No unexpected osseous changes. Lytic lesion partially visible in the C6 vertebral body. No new osseous abnormality identified. SOFT TISSUES: Small volume postoperative gas in and around the decompression site, in the left upper thoracic epidural space. Postoperative drain in place from a right percutaneous approach terminating in the posterior paraspinal soft tissues. Left upper extremity approach PICC line type catheter in place, terminating at the superior cavoatrial junction. Heterogeneous enlarged left thyroid  lobe. Numerous small gallstones. Right greater than left dependent pulmonary atelectasis. Superimposed multiple small bilateral  pulmonary nodules, individually up to 9 mm in diameter, most compatible with pulmonary metastases. IMPRESSION: 1. T1 through T3 decompression and fusion with T2 corpectomy. Posterior post-operative drain in place. No adverse features by CT. No new osseous abnormality identified in the thoracic spine. 2. Heterogeneous enlarged left thyroid . Partially visible known pulmonary metastases and lytic lesion of the C6 vertebral body 3. Gallstones. Electronically signed by: Helayne Hurst MD 05/06/2024 12:58 PM EST RP Workstation: [id number]   MR THORACIC SPINE W WO CONTRAST Result Date: 05/06/2024 CLINICAL DATA:  Follow-up examination is status post surgery. EXAM: MRI THORACIC WITHOUT AND WITH CONTRAST TECHNIQUE: Multiplanar and multiecho pulse sequences of the thoracic spine were obtained without and with intravenous contrast. CONTRAST:  10mL GADAVIST  GADOBUTROL  1 MMOL/ML IV SOLN COMPARISON:  Comparison made with prior MRI from 05/01/2024. FINDINGS: Alignment: Stable alignment with preservation of the normal thoracic kyphosis. Underlying trace dextroscoliosis. No interval listhesis or malalignment. Vertebrae: Postoperative changes from interval posterior decompression and fusion at T1 through T3 with T2 corpectomy. The metastatic lesion at T2 has largely been resected, although residual tumor is noted along the anterolateral aspect of the vertebral column, worse on the left (series 17, images 7, 10, 14 for example). No visible residual spinal stenosis, although the operative level is obscured by susceptibility artifact. No visible complication. Vertebral body height otherwise maintained with no other acute or chronic fracture. Decreased T1 signal intensity throughout the visualized bone marrow. Small benign hemangioma noted within the T6 vertebral body. No other worrisome osseous lesions or evidence for metastatic disease. Cord: Upper thoracic cord is obscured at the level of T2 due to susceptibility artifact. Otherwise,  normal signal morphology. No abnormal enhancement. Paraspinal and other soft tissues: Postoperative changes within the upper posterior paraspinous soft tissues at T2. No loculated collection or adverse features. Small layering bilateral pleural effusions, right greater than left. Disc levels: No significant disc pathology seen within the underlying thoracic spine. No other stenosis or neural impingement. IMPRESSION: 1. Postoperative changes from interval posterior instrumentation with decompression and fusion at T1 through T3, with T2 corpectomy. No visible residual spinal stenosis, although the operative level is largely obscured by susceptibility artifact. No complication. 2. No other evidence for metastatic disease within the thoracic spine. 3. Small layering bilateral pleural effusions, right greater than left. Electronically Signed   By: Morene Hoard M.D.   On: 05/06/2024 03:46   DG Thoracic Spine 2 View Result Date: 05/05/2024 CLINICAL DATA:  Back pain. EXAM: THORACIC SPINE 2 VIEWS COMPARISON:  Chest radiograph dated 05/05/2024. FINDINGS: No acute fracture or subluxation of the thoracic spine. With upper thoracic posterior fusion noted. The soft tissues are unremarkable. IMPRESSION: 1. No acute findings. 2. Upper thoracic posterior fusion. Electronically Signed   By: Vanetta Chou M.D.   On: 05/05/2024 21:14   DG Chest 1 View Result Date: 05/05/2024 CLINICAL DATA:  PICC placement. EXAM: CHEST  1 VIEW COMPARISON:  Chest radiograph dated 05/02/2024. FINDINGS: Left-sided PICC with tip at the cavoatrial junction. There is eventration of the right hemidiaphragm. No focal consolidation, pleural effusion or pneumothorax. The cardiac silhouette is within normal limits. No acute osseous pathology. IMPRESSION: Left-sided PICC with tip at the cavoatrial junction. Electronically Signed   By: Vanetta Chou M.D.   On: 05/05/2024 21:13   DG Cervical Spine 2 or 3 views Result Date:  05/05/2024 CLINICAL DATA:  Elective surgery. EXAM: CERVICAL SPINE - 2-3 VIEW COMPARISON:  Preoperative imaging. FINDINGS: Six fluoroscopic spot views of the lower cervical and upper thoracic spine submitted from the operating room. Imaging obtained during posterior fusion C7 through T4 with T2 corpectomy. Fluoroscopy time 52.1 seconds. Dose 37.89 mGy. IMPRESSION: Intraoperative fluoroscopy during cervicothoracic surgery. Electronically Signed   By: Andrea Gasman M.D.   On: 05/05/2024 16:38   DG C-Arm 1-60 Min-No Report Result Date: 05/05/2024 Fluoroscopy was utilized by the requesting physician.  No radiographic interpretation.   DG C-Arm 1-60 Min-No Report Result Date: 05/05/2024 Fluoroscopy was utilized by the requesting physician.  No radiographic interpretation.   DG C-Arm 1-60 Min-No Report Result Date: 05/05/2024 Fluoroscopy was utilized by the requesting physician.  No radiographic interpretation.   DG C-Arm 1-60 Min-No Report Result Date: 05/05/2024 Fluoroscopy was utilized by the requesting physician.  No radiographic interpretation.   DG C-Arm 1-60 Min-No Report Result Date: 05/05/2024 Fluoroscopy was utilized by the requesting physician.  No radiographic interpretation.   DG C-Arm 1-60 Min-No Report Result Date: 05/05/2024 Fluoroscopy was utilized by the requesting physician.  No radiographic interpretation.   DG O-ARM IMAGE ONLY/NO REPORT Result Date: 05/05/2024 There is no Radiologist interpretation  for this exam.  US  THYROID  FNA EACH NODULE Result Date: 05/04/2024 INDICATION: [id number] Thyroid  mass [id number] Indeterminate thyroid  nodule EXAM: ULTRASOUND GUIDED FINE NEEDLE ASPIRATION OF INDETERMINATE THYROID  NODULE COMPARISON:  U/S thyroid  on 04/29/2024 CT CAP, 05/02/2024. MEDICATIONS: 4 cc of 1% lidocaine  COMPLICATIONS: None immediate. TECHNIQUE: Informed written consent was obtained from the patient after a discussion of the risks, benefits and alternatives to treatment.  Questions regarding the procedure were encouraged and answered. A timeout was performed prior to the initiation of the procedure. Pre-procedural ultrasound scanning demonstrated unchanged size and appearance of the indeterminate nodule within the left lobe of the thyroid . The procedure was planned. The neck was prepped in the usual sterile fashion, and a sterile drape was applied covering the operative field. A timeout was performed prior to the initiation of the procedure. Local anesthesia was provided with 1% lidocaine . Under direct ultrasound guidance, 5 FNA biopsies were performed of the left thyroid  nodule with a 25 gauge needle. Multiple ultrasound images were saved for procedural documentation purposes. The samples were prepared and submitted to pathology. Limited post procedural scanning was negative for hematoma or additional complication. Dressings were placed. The patient tolerated the above procedures procedure well without immediate postprocedural complication. FINDINGS: Nodule reference number based on prior diagnostic ultrasound: 1 Maximum size: 8.2 cm Location: Left; occupies majority of lobe ACR TI-RADS risk category: TR4 (4-6 points) Reason for biopsy: meets ACR TI-RADS criteria Ultrasound imaging confirms appropriate placement of the needles within the thyroid  nodule. IMPRESSION: Successful ultrasound guided FNA biopsy of a dominant 8.2 cm LEFT TR-4 thyroid  nodule Procedure performed by Carlin Griffon, PA-C under direct supervision of Thom Hall, MD Electronically Signed   By: Thom Hall M.D.   On: 05/04/2024 17:03

## 2024-06-02 NOTE — H&P (Addendum)
 Physical Medicine and Rehabilitation Admission H&P     Chief complaint: Pathological compression causing incomplete paraplegia/SCI and right humerus fracture due to pathological fx     HPI: Joseph [name] is a 43 year old male with PMH of asthma, anxiety, depression, with history of polysubstance use disorder  who presented to [facility] on 04/27/2024 due to worsening low back pain, rib pain for several weeks. Due to pain, he endorsed difficulty walking and saddle anesthesia. In ED he was hemodynamically stable, A chest xray showed possible LUL nodule and degenerative changes in the lumbar spine. Right should x-ray showed humeral lucency in the proximal humerus, differential considerations included metastatic disease or multiple myeloma. CT scan showed a large Schmorl's node at the inferior L5 endplate. CT angiogram of chest showed right upper lobe segmental pulmonary embolism with incidental findings of right lower lobe nodules, enlarged and heterogeneous left thyroid  lobe measuring up to 5 cm with right tracheal deviation. Nonemergent thyroid  ultrasound recommended. Heparin  was initiated for pulmonary embolism, patient transferred to [facility] for admission.    Due to difficulty with ambulation and LE sensory changes, neurology was consulted and recommended full spine MRI which showed a pathologic fracture at T2 with several spinal canal stenosis and cord compression L > R with subtle thoracic cord signal change suggesting early cord edema. He was transferred to [facility] on 11/9 for neurosurgery consultation and operative intervention after eliquis  wash out. Patient underwent a posterior T1-3 instrumentation and fusion with T1-3 laminectomy, T2 corpectomy for resection of metastatic tumor by Dr. Darnella on 11/13. IR performed a ultrasound guided needle aspiration of thyroid  mass that revealed papillary carcinoma, surgical path from T2 confirmed metastatic papillary thyroid   carcinoma. Oncology was consulted and reccommended outpatient follow up with ENT, radiation oncology, and endocrinology.    Palliative care consulted for goals of care and pain management. A prevena suction vac was placed by WOC on 11/24, discontinued on 11/27. He continues on Eliquis . On 11/17 he developed a fever where a workup was negative, WBC trended down. He was admitted to CIR on 11/24 where he was significantly limited by right shoulder pain, initially suspected to be rotator cuff pathology. A MRI completed on 11/29 showed a large tumor with associated pathological fracture of the proximal right humerus. He was admitted back to acute care services. Dr. Dozier performed IMN of R humerus on 11/29. Post op he was WBAT to the RUE. Hospital course complicated by chronic pain with palliative care was consulted for Radiation oncology consulted for palliative radiation to the spine and humerus with plans for 2-week course beginning 12/11 to the spine and 12/18 to the humerus.  He will follow-up with oncology as outpatient for systemic treatment. Palliative care continues to follow of pain/symptom management.    Prior to arrival the patient was independent and driving without use of DME. He currently requires min assist with mobility and basic ADLs.  Therapy evaluations completed due to patient decreased functional mobility was admitted for a comprehensive rehab program.        Pt reports LBM 2 days ago- but doesn't feel constipated Voiding on his own So tired this afternoon after radiation this AM- said hadn't had IV Dilaudid  this afternoon to make him so tired.   Same old pain, but is somewhat better in RUE- and overall a little better than when was in CIR last.      Review of Systems  Constitutional:  Positive for  malaise/fatigue.  HENT: Negative.    Eyes: Negative.   Respiratory:  Positive for cough. Negative for sputum production and shortness of breath.   Cardiovascular:  Negative for  chest pain and leg swelling.  Gastrointestinal:  Negative for constipation and nausea.       LBM 2 days ago  Genitourinary: Negative.        Voiding on his own  Musculoskeletal:  Positive for back pain, joint pain, myalgias and neck pain.  Skin: Negative.   Neurological:  Positive for tingling, focal weakness and weakness. Negative for sensory change.  Endo/Heme/Allergies: Negative.   Psychiatric/Behavioral:  Positive for depression.   All other systems reviewed and are negative.                Past Medical History:  Diagnosis Date   Anxiety     Asthma      as a child   Depression     Narcotic abuse (HCC)     Pulmonary nodules 04/29/2024             Past Surgical History:  Procedure Laterality Date   CLOSED REDUCTION FINGER WITH PERCUTANEOUS PINNING Right 06/22/2013    Procedure: RIGHT HAND SMALL FINGER CLOSED MANIPULATION AND PINNING;  Surgeon: Prentice LELON Pagan, MD;  Location: [facility] OR;  Service: Orthopedics;  Laterality: Right;   HUMERUS IM NAIL Right 05/21/2024    Procedure: INSERTION, INTRAMEDULLARY ROD, HUMERUS;  Surgeon: Dozier Soulier, MD;  Location: [facility] OR;  Service: Orthopedics;  Laterality: Right;   I & D EXTREMITY   03/24/2012    Procedure: IRRIGATION AND DEBRIDEMENT EXTREMITY;  Surgeon: Oneil JAYSON Herald, MD;  Location: [facility] OR;  Service: Orthopedics;  Laterality: Right;  Right Forearm   POSTERIOR CERVICAL FUSION/FORAMINOTOMY N/A 05/05/2024    Procedure: POSTERIOR CERVICAL FUSION CERVICAL SEVEN-THORACIC ONE, THORACIC ONE-THORACIC TWO, THORACIC TWO-THORACIC THREE ,FOR RESECTION OF TUMOR THORACIC TWO CORPECTOMY;  Surgeon: Darnella Dorn SAUNDERS, MD;  Location: [facility] OR;  Service: Neurosurgery;  Laterality: N/A;             Family History  Problem Relation Age of Onset   Other Other     Alcohol abuse Other          Social History:  reports that he has been smoking cigarettes. He has a 10 pack-year smoking history. He has never used smokeless tobacco. He reports that he does  not currently use alcohol. He reports current drug use. Drug: Marijuana. Allergies: [Allergies]  [Allergies] No Known Allergies       Medications Prior to Admission  Medication Sig Dispense Refill   apixaban  (ELIQUIS ) 5 MG TABS tablet Take 5-10 mg by mouth 2 (two) times daily. Was taking 10mg  by mouth twice daily. Instructions to start 5mg  by mouth twice daily on 05/21/29.       cyclobenzaprine  (FLEXERIL ) 10 MG tablet Take 10 mg by mouth 3 (three) times daily.       Ensure Max Protein (ENSURE MAX PROTEIN) LIQD Take 11 oz by mouth 2 (two) times daily.       methocarbamol  (ROBAXIN ) 750 MG tablet Take 750 mg by mouth 4 (four) times daily.       Multiple Vitamin (MULTIVITAMIN) tablet Take 1 tablet by mouth daily.       oxyCODONE  HCl (OXYCONTIN  PO) Take 100 mg by mouth every 8 (eight) hours.       polyethylene glycol (MIRALAX  / GLYCOLAX ) 17 g packet Take 17 g by mouth 2 (two) times daily.  pregabalin  (LYRICA ) 50 MG capsule Take 50 mg by mouth 3 (three) times daily.       sennosides-docusate sodium  (SENOKOT-S) 8.6-50 MG tablet Take 2 tablets by mouth in the morning and at bedtime.                  Home: Home Living Family/patient expects to be discharged to:: Private residence Living Arrangements: Spouse/significant other Available Help at Discharge: Family, Available 24 hours/day Type of Home: House Home Access: Stairs to enter Secretary/administrator of Steps: 2 Entrance Stairs-Rails: None Home Layout: One level Bathroom Shower/Tub: Health Visitor: Handicapped height Home Equipment: Medical Laboratory Scientific Officer - single point, Information systems manager, BSC/3in1   Functional History: Prior Function Prior Level of Function : Independent/Modified Independent, History of Falls (last six months) Mobility Comments: indep, no AD, 1 recent fall due to BLEs giving out ADLs Comments: indep, drives   Functional Status:  Mobility: Bed Mobility Overal bed mobility: Needs Assistance Bed Mobility: Sit  to Sidelying, Rolling Rolling: Supervision Sidelying to sit: Supervision Supine to sit: Supervision, HOB elevated, Used rails Sit to supine: Mod assist, Used rails Sit to sidelying: Min assist, Used rails General bed mobility comments: Assist for BLE over edge of bed, cues for log roll, pt using rail PRN to return to supine Transfers Overall transfer level: Needs assistance Equipment used: Right platform walker Transfers: Sit to/from Stand, Bed to chair/wheelchair/BSC Sit to Stand: Min assist, From elevated surface, +2 safety/equipment Bed to/from chair/wheelchair/BSC transfer type:: Step pivot Step pivot transfers: Min assist, +2 safety/equipment Transfer via Lift Equipment: Stedy General transfer comment: chair>RW and RW> EOB via step pivot toward his R, pt able to maintain standing a couple mins while bed linens changed by NT. Ambulation/Gait Ambulation/Gait assistance: Contact guard assist, +2 safety/equipment Gait Distance (Feet): 40 Feet Assistive device: Right platform walker Gait Pattern/deviations: Step-through pattern, Decreased stride length, Narrow base of support General Gait Details: pt defers due to pain/fatigue, and visitors arriving to his room with his dinner while pt returned to bed Gait velocity: decr   ADL: ADL Overall ADL's : Needs assistance/impaired Eating/Feeding: Set up, Sitting Eating/Feeding Details (indicate cue type and reason): for cutting up food Grooming: Oral care, Wash/dry face, Set up, Sitting Grooming Details (indicate cue type and reason): Setup to wash face and ears, Mod A to don deodorant - assist to don under L arm. Pt able to use LUE to don under R arm Upper Body Bathing: Moderate assistance, Sitting Upper Body Bathing Details (indicate cue type and reason): Mod A for posterior bathing Lower Body Bathing: Maximal assistance, Sit to/from stand, Sitting/lateral leans Lower Body Bathing Details (indicate cue type and reason): assist to bathe  bottom in standing Upper Body Dressing : Maximal assistance Upper Body Dressing Details (indicate cue type and reason): Max A to doff UB dressing Lower Body Dressing: Maximal assistance, Bed level Lower Body Dressing Details (indicate cue type and reason): max A don socks bed level Toilet Transfer: Moderate assistance, +2 for physical assistance, +2 for safety/equipment, Stand-pivot (R platform RW) Toilet Transfer Details (indicate cue type and reason): STS with stedy this session Toileting- Clothing Manipulation and Hygiene: Maximal assistance Toileting - Clothing Manipulation Details (indicate cue type and reason): Pt used bedside urinal while supine   Cognition: Cognition Orientation Level: Oriented X4 Cognition Arousal: Alert Behavior During Therapy: WFL for tasks assessed/performed   Physical Exam: Blood pressure (!) 107/91, pulse 96, temperature 98.4 F (36.9 C), resp. rate 19, height 5' 9 (1.753 m), weight ROLLEN)  153 kg, SpO2 93%. Physical Exam Vitals and nursing note reviewed.  Constitutional:      Appearance: He is obese.     Comments: Awake, but drowsing.very sleepy- hard to stay awake, appropriate, NAD  HENT:     Head: Normocephalic and atraumatic.     Right Ear: External ear normal.     Left Ear: External ear normal.     Nose: Nose normal. No congestion.     Mouth/Throat:     Mouth: Mucous membranes are dry.     Pharynx: Oropharynx is clear. No oropharyngeal exudate.  Eyes:     General:        Right eye: No discharge.        Left eye: No discharge.     Extraocular Movements: Extraocular movements intact.  Cardiovascular:     Rate and Rhythm: Normal rate and regular rhythm.     Heart sounds: Normal heart sounds. No murmur heard.    No gallop.  Pulmonary:     Effort: Pulmonary effort is normal. No respiratory distress.     Breath sounds: Normal breath sounds. No wheezing, rhonchi or rales.     Comments: Rare cough- non productive Abdominal:     Comments:  Protuberant; NT, ND (same size as when saw last)- slightly hypoactive BS  Musculoskeletal:     Cervical back: Neck supple.     Comments: LUE 5/5 RUE- at least 3/5 proximally; and 5-/5 distally- limited by pain and surgery of proximal humerus RLE- HF 3-/5; KE/KF 4+/5; DF 4/5 and PF 3-/5 LLE- HF 3+/5; KE/KF 5-/5 and DF/PF 5-/5  Skin:    General: Skin is warm and dry.     Comments: Bandage seen on R proximal arm- c/d/I Didn't assess thoracic incision- due to pt's sleepiness-didn't turn over  Neurological:     Comments: 5-6 beats clonus B/L No hoffman's MAS of 1 in LE's- much better Intact to light touch in all 4 extremities  Psychiatric:     Comments: Sleepy, but more interactive than last time I saw him       Lab Results Last 48 Hours        Results for orders placed or performed during the hospital encounter of 05/21/24 (from the past 48 hours)  CBC     Status: Abnormal    Collection Time: 06/02/24  4:04 AM  Result Value Ref Range    WBC 7.0 4.0 - 10.5 K/uL    RBC 3.45 (L) 4.22 - 5.81 MIL/uL    Hemoglobin 9.9 (L) 13.0 - 17.0 g/dL      Comment: REPEATED TO VERIFY    HCT 32.5 (L) 39.0 - 52.0 %    MCV 94.2 80.0 - 100.0 fL    MCH 28.7 26.0 - 34.0 pg    MCHC 30.5 30.0 - 36.0 g/dL    RDW 86.1 88.4 - 84.4 %    Platelets 94 (L) 150 - 400 K/uL      Comment: CONSISTENT WITH PREVIOUS RESULT REPEATED TO VERIFY REPEATED TO VERIFY Immature Platelet Fraction may be clinically indicated, consider ordering this additional test [id number]      nRBC 0.0 0.0 - 0.2 %      Comment: Performed at [facility], [address], [place], [place] [address]  Basic metabolic panel     Status: Abnormal    Collection Time: 06/02/24  4:04 AM  Result Value Ref Range    Sodium 135 135 - 145 mmol/L    Potassium  3.9 3.5 - 5.1 mmol/L    Chloride 101 98 - 111 mmol/L    CO2 26 22 - 32 mmol/L    Glucose, Bld 123 (H) 70 - 99 mg/dL      Comment: Glucose reference range applies only to samples taken  after fasting for at least 8 hours.    BUN 11 6 - 20 mg/dL    Creatinine, Ser 9.39 (L) 0.61 - 1.24 mg/dL    Calcium 8.3 (L) 8.9 - 10.3 mg/dL    GFR, Estimated >39 >39 mL/min      Comment: (NOTE) Calculated using the CKD-EPI Creatinine Equation (2021)      Anion gap 8 5 - 15      Comment: Performed at [facility], [address], [place], [place] [address]      Imaging Results (Last 48 hours)  No results found.         Blood pressure (!) 107/91, pulse 96, temperature 98.4 F (36.9 C), resp. rate 19, height 5' 9 (1.753 m), weight (!) 153 kg, SpO2 93%.   Medical Problem List and Plan: 1. 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              -patient may  shower             -ELOS/Goals: ~ 14 days- supervision             Admit to CIR             Appropriate for CIR- didn't finish first time due to R humeral pathological fx due to Thyroid  cancer mets 2.  Antithrombotics: -DVT/anticoagulation:  Mechanical: Sequential compression devices, below knee Bilateral lower extremities Pharmaceutical: Eliquis              -antiplatelet therapy: N/A   3. Pain Management: Robaxin  750 TID AC/HS, Lyrica  50 mg TID, Methadone  10 mg TID. Oxycodone  30 mg q3 hours as needed for breakthrough pain. Palliative care to follow.    4. Mood/Behavior/Sleep: LCSW to follow for evaluation and support when available.              -antipsychotic agents: n/a   5. Neuropsych/cognition: This patient is capable of making decisions on his own behalf.   6. Skin/Wound Care: Routine pressure relief measures. Monitor incisions   7. Fluids/Electrolytes/Nutrition: Monitor I&O and weight. Follow up labs CBC/CMP     8. 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 Completed course of dexamethasone . Patient to follow up outpatient with ENT, endocrinology and radiation oncology. ENT plan to discuss at multidisciplinary  tumor board on 06/08/24.              Will likely need Thyroidectomy and RAI 9. Pathological right proximal humerus fracture: s/p IMR 11/29. WBAT in RUE due to needing that- use platform walker.  Ortho follow up in 2 weeks for recheck and updated xrays.  -Patient to remain in sling.   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 Patient underwent instrumentation and fusion on prior admission. Radiation Onc plans 2 weeks of radiotherapy to thoracic spine starts 06/02/24- right humerus on 06/09/24.   11. Hx of PE: dx on 11/6 provoked secondary to metastatic cancer. Continue Eliquis  for 3-6 months.   12. Paraplegia/Neurogenic bladder: Monitor bladder scan with PVR    13. Chronic normocytic anemia: Stable, continue Niferex 150 mg daily    14. Constipation/mild neurogenic bowel: Continue Miralax  and SennaK  15. Polysubstance use disorder- will have palliative care f/u with meds after d/c for wean   Daphne LOISE Satterfield, NP 06/02/2024   I have personally performed a face to face diagnostic evaluation of this patient and formulated the key components of the plan.  Additionally, I have personally reviewed laboratory data, imaging studies, as well as relevant notes and concur with the physician assistant's documentation above.   The patient's status has not changed from the original H&P.  Any changes in documentation from the acute care chart have been noted above.   I spent a total of 80   minutes myself on total care today- >50% coordination of care- due to review of updated chart, d/w pt and exam- ASIA exam done except rectal exam, and documentation- as well as d/w NP

## 2024-06-02 NOTE — Progress Notes (Addendum)
 Physical Therapy Treatment Patient Details Name: Joseph [name] MRN: [id number] DOB: December 31, 1980 Today's Date: 06/02/2024   History of Present Illness Pt is a 43 y.o. male presenting 11/28 from CIR with R shoulder pain; found to have R humeral pathologic fx. S/p IM rod placement 11/29. 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 At CIR 11/24-11/28. PMH: cocaine use disorder, cannibis use disorder.    PT Comments  Pt received in supine, c/o feeling warm but does not present with fever. Pt agreed to sit EOB but defers further mobility today due to increased fatigue and pain after radiation. Pt c/o 8/10 back/ R shoulder pain, RN gave pain meds during session prior to pt sitting EOB. Pt required min A to sit EOB and mod A+2 to get back into supine with assist for LE. Pt BP 162/85 and HR 101 in sitting with feet supported. Pt sat EOB for ~10 minutes before requesting to return to supine. Patient will benefit from intensive inpatient follow-up therapy, >3 hours/day, pt reports plan for therapies in AM prior to his PM radiation appointments, which will be ideal since his pain/fatigue is increased this afternoon after having radiation apt in AM.    If plan is discharge home, recommend the following: Two people to help with walking and/or transfers;Two people to help with bathing/dressing/bathroom;Assistance with cooking/housework;Assist for transportation;Help with stairs or ramp for entrance   Can travel by private vehicle        Equipment Recommendations  Other (comment)    Recommendations for Other Services       Precautions / Restrictions Precautions Precautions: Fall;Back;Shoulder Type of Shoulder Precautions: elbow/wrist/hand ROM only, WBAT for transfers per op note Shoulder Interventions:  Shoulder sling/immobilizer Precaution Booklet Issued: Yes (comment) Recall of Precautions/Restrictions: Intact Precaution/Restrictions Comments: painful R shoulder, present prior to admission but pt states worse now Required Braces or Orthoses: Sling Restrictions Weight Bearing Restrictions Per Provider Order: Yes RUE Weight Bearing Per Provider Order: Non weight bearing RLE Weight Bearing Per Provider Order: Weight bearing as tolerated LLE Weight Bearing Per Provider Order: Weight bearing as tolerated Other Position/Activity Restrictions: Per Dr. Dozier We will allow weightbearing to tolerance given his lower extremity paraplegia and need for his upper extremities     Mobility  Bed Mobility Overal bed mobility: Needs Assistance Bed Mobility: Sit to Sidelying, Sidelying to Sit, Rolling Rolling: Contact guard assist Sidelying to sit: Min assist, Used rails, HOB elevated     Sit to sidelying: Mod assist, +2 for physical assistance, HOB elevated, Used rails General bed mobility comments: Assist for BLE over edge of bed, cues for log roll, pt requring more assit to get back into supine with increased pain today.    Transfers                        Ambulation/Gait                   Stairs             Wheelchair Mobility     Tilt Bed    Modified Rankin (Stroke Patients Only)       Balance Overall balance assessment: History of Falls, Needs assistance Sitting-balance support: Feet supported, No upper extremity supported Sitting balance-Leahy Scale: Good Sitting balance - Comments: Sitting EOB for ~10 minutes  Communication Communication Communication: No apparent difficulties  Cognition Arousal: Alert Behavior During Therapy: WFL for tasks assessed/performed   PT - Cognitive impairments: No apparent impairments                         Following commands: Intact      Cueing  Cueing Techniques: Verbal cues, Visual cues  Exercises      General Comments General comments (skin integrity, edema, etc.): Pt was c/o being hot, temperature taken and WFL. Pt was given 2 cold rags for neck and forehead along with fan. Pt c/o being more tired today. Pt BP slightly elevated sitting EOB. Pt able to sit EOB for ~10 minutes before requesting to lay back down. RN gave pain meds during session pain 8/10. Sling readjusted prior to session.      Pertinent Vitals/Pain Pain Assessment Pain Assessment: 0-10 Pain Score: 8  Pain Location: R arm and shoulder Pain Descriptors / Indicators: Discomfort, Guarding, Grimacing Pain Intervention(s): Limited activity within patient's tolerance, Monitored during session, RN gave pain meds during session    Home Living                          Prior Function            PT Goals (current goals can now be found in the care plan section) Acute Rehab PT Goals PT Goal Formulation: With patient Time For Goal Achievement: 06/05/24 Progress towards PT goals: Progressing toward goals    Frequency    Min 3X/week      PT Plan      Co-evaluation              AM-PAC PT 6 Clicks Mobility   Outcome Measure  Help needed turning from your back to your side while in a flat bed without using bedrails?: A Little Help needed moving from lying on your back to sitting on the side of a flat bed without using bedrails?: A Little Help needed moving to and from a bed to a chair (including a wheelchair)?: A Little Help needed standing up from a chair using your arms (e.g., wheelchair or bedside chair)?: A Little Help needed to walk in hospital room?: A Lot Help needed climbing 3-5 steps with a railing? : Total 6 Click Score: 15    End of Session Equipment Utilized During Treatment: Gait belt;Other (comment) Activity Tolerance: Patient limited by fatigue;Patient limited by pain Patient left: with bed alarm set;in bed;with  family/visitor present;with call bell/phone within reach Nurse Communication: Mobility status PT Visit Diagnosis: Unsteadiness on feet (R26.81);Other abnormalities of gait and mobility (R26.89);Difficulty in walking, not elsewhere classified (R26.2);Pain Pain - Right/Left: Right Pain - part of body: Shoulder;Arm     Time: 1250 (PTA left room from 1312-1319  while pt sitting EOB, returned at 1320 per pt request for back to bed)-1326 PT Time Calculation (min) (ACUTE ONLY): 36 min  Charges:    $Therapeutic Activity: 23-37 mins PT General Charges $$ ACUTE PT VISIT: 1 Visit                     Johnnie Gaynelle JACQUE Johnnie Kaedence Connelly 06/02/2024, 2:01 PM

## 2024-06-02 NOTE — Assessment & Plan Note (Signed)
 Continue apixaban  twice daily Bleeding precautions

## 2024-06-02 NOTE — Progress Notes (Signed)
 Inpatient Rehab Admissions Coordinator:    I have insurance approval and a bed available for pt to admit to CIR today. Dr. Patsy in agreement and [facility] aware.  I spoke to patient and his mom on the phone.  Plan ~2 weeks on CIR with discharge to aunt's home with supervision/mod I goals.  Family in agreement.  I will make arrangements.   Reche Lowers, PT, DPT Admissions Coordinator [phone] 06/02/2024 12:35 PM

## 2024-06-02 NOTE — Progress Notes (Signed)
 Daily Progress Note   Patient Name: Joseph [name]       Date: 06/02/2024 DOB: 1981/04/22  Age: 43 y.o. MRN#: [id number] Attending Physician: Patsy Lenis, MD Primary Care Physician: Patient, No Pcp Per Admit Date: 05/21/2024 Length of Stay: 12 days  Reason for Consultation/Follow-up: Pain control  Subjective:  Subjective: Chart reviewed including personal review of pertinent labs and imaging.  Labs remarkable for creatinine 0.6.  Hemoglobin 9.9.  ECG reviewed for monitoring of QTc.  Reviewed notes from Dr. Vann.  As well as radiation oncology.  Plan for palliative radiation to begin next week.  I saw and examined Mr. Birmingham today.  He continues to have pain after falling asleep and waking as he does not arouse without rescue medication.  Discussed that we will continue to evaluate this after he has been on methadone  for enough time to evaluate that it has been a steady state.  Chart review reveals that he has received Dilaudid  1 mg x 3 doses and oxycodone  30 mg x 3 doses in the last 24 hours.  Overall, goal remains to get back to CIR.  He understands he will need to be off IV medications to do this.  We also discussed plan for palliative radiation and he is hopeful this will help relieve some of his symptom burden.  He went for simulation yesterday and reports that it went well being very hard.  Schedule at bedside appears to note plan to start therapy next week.   Review of Systems Pain, but improved overall Objective:   Vital Signs:  BP (!) 107/91 (BP Location: Right Wrist)   Pulse 96   Temp 98.4 F (36.9 C)   Resp 19   Ht 5' 9 (1.753 m)   Wt (!) 153 kg   SpO2 93%   BMI 49.81 kg/m   Physical Exam: General: NAD, alert Eyes: conjunctiva clear, anicteric sclera HENT: normocephalic, atraumatic, moist mucous membranes Cardiovascular: RRR, no edema in LE b/l Respiratory: no increased work of breathing noted, not in respiratory distress Abdomen: not distended Skin: no  rashes or lesions on visible skin Neuro: A&O, following commands easily Psych: appropriately answers all questions  Imaging: @IMAGES @  I personally reviewed recent imaging.   Assessment & Plan:   Assessment: 43 year old male with history of asthma, polysubstance abuse, metastatic papillary thyroid  cancer.  Was in CIR but transition back to medical floor after worsening shoulder pain and found to have evidence of pathologic proximal humerus fracture.  Recommendations/Plan: # Complex medical decision making/goals of care:  - Continue current care plan.  He remains full code.  Goal is to discharge back to [facility] to continue to regain as much functional status as possible.  - Plan is for palliative radiation.  Hopefully this will be effective in reducing symptom burden.  -  Code Status: Full Code  Prognosis: Unable to determine  # Symptom management: Patient is receiving these palliative interventions for symptom management with an intent to improve quality of life.   - Pain, cancer related:  - Continue methadone  10 mg 3 times per day.  He understands that it takes several days for methadone  to reach steady state to determine overall effectiveness of current dose.  Currently on 3rd day of current dose of methadone . - Continue oxycodone  30 mg every 3 hours as needed for breakthrough pain - Continue Dilaudid  IV 1 mg before hours as needed for pain crisis.  He is trying to avoid this as this is a barrier to him  getting back to CIR.   # Discharge Planning: CIR when medically stable and pain controlled  -  Discussed with: Patient  Thank you for allowing the palliative care team to participate in the care Charmaine GORMAN Birmingham.  Amaryllis Meissner, MD Palliative Care Provider PMT # [phone]  If patient remains symptomatic despite maximum doses, please call PMT at [phone] between 0700 and 1900. Outside of these hours, please call attending, as PMT does not have night coverage.   Billing  based on MDM: High  Problems Addressed: One acute or chronic illness or injury that poses a threat to life or bodily function  Amount and/or Complexity of Data: Category 1:Review of prior external note(s) from each unique source and Review of the result(s) of each unique test  Risks: Parenteral controlled substances and Drug therapy requiring intensive monitoring for toxicity

## 2024-06-02 NOTE — Discharge Summary (Signed)
 Physician Discharge Summary   Joseph [name] FMW:[id number] DOB: 01-21-1981 DOA: 05/21/2024  PCP: Patient, No Pcp Per  Admit date: 05/21/2024 Discharge date: 06/02/2024  Admitted From: CIR Disposition:  CIR Discharging physician: Alm Apo, MD Barriers to discharge: none  Recommendations at discharge: Continue radiation plans as outlined below Outpatient follow ups with ENT and oncology Palliative care to continue assisting with pain management    Discharge Condition: stable CODE STATUS: Full  Diet recommendation:  Diet Orders (From admission, onward)     Start     Ordered   05/21/24 1410  Diet regular Room service appropriate? Yes; Fluid consistency: Thin  Diet effective now       Question Answer Comment  Room service appropriate? Yes   Fluid consistency: Thin      05/21/24 1409            Hospital Course: MURICE BARBAR is a 43 y.o. male with a history of asthma, polysubstance abuse, metastatic papillary thyroid  cancer. Patient presented from acute inpatient rehabilitation secondary to right shoulder pain and found to have evidence of a pathologic right proximal humerus fracture. Orthopedic surgery consulted for surgical repair. He underwent IM rod placement on 11/29. ENT consulted while admitted and recommended outpatient follow-up.  Rad onc also consulted and recommended radiation to T1-3 and right humerus. Started on 12/11 beginning to back first.      Assessment and Plan:   Pathologic right proximal humerus fracture Patient with previously known lucency of right proximal humerus consistent with metastasis. X-ray just prior to this admission significant for a lytic lesion with pathologic fracture of the right proximal humerus with mild displacement.  Orthopedic surgery consulted and successfully placed an intramedullary rod on 11/29. -Orthopedic surgery recommendations: weight bearing as tolerated, hand, wrist, elbow only -pain control  - radiation  to begin after T-spine treatment  - per ortho: Up with therapy- work with OT, hand, wrist elbow only Weight Bearing as Tolerated (WBAT) RUE VTE prophylaxis: Eliquis   Follow up in office in 2 weeks for recheck and updated xrays. Patient to remain in sling until then.    Metastatic papillary thyroid  cancer Pulmonary nodules and bone lesions concerning for metastatic disease. Recently diagnosed on recent admission. Patient set up to follow-up with medical oncology, Dr. Tina. ENT and endocrinology referral placed on prior admission. ENT consulted. CT neck obtained and without lateral or central cervical lymphadenopathy. ENT plan to discuss at multidisciplinary tumor board on 06/08/24.  - outpatient further management with oncology and ENT already in place   History of thoracic spine fracture Pathological in etiology. Patient underwent instrumentation and fusion on prior admission. -Continue pain management - involved T1-3 - per rad onc: course of 2 weeks of radiotherapy to the thoracic spine beginning on Thursday this week, and 5 fraction. We will simulate today and begin treatment to the spine on 06/02/24, and to the right humerus on 06/09/24. - continues to ambulate well with PT   History of pulmonary embolism Recently diagnosed on 11/6. Provoked secondary to metastatic cancer. Patient is managed on Eliquis , which was initially held for surgery and is now resumed. -Continue Eliquis    Paraplegia Neurogenic bladder Patient was admitted from acute inpatient rehabilitation. Patient  -Continue PT/OT   Chronic normocytic anemia Stable.   Constipation -Continue MiraLAX    Chronic pain Patient's pain medication regimen was managed while in acute inpatient rehab. Patient developed overdose requiring narcan . Analgesics adjusted. Palliative care consulted. Patient started on methadone . -Palliative care recommendations: methadone , Lyrica , oxycodone   as needed, dilaudid  IV as needed-- trying to  limit IV so patient can return to CIR   History of substance abuse Noted. Patient counseled on admission.   Principal Diagnosis: Pathological fracture of right humerus  Discharge Diagnoses: Active Hospital Problems   Diagnosis Date Noted   Pathological fracture of right humerus 05/20/2024   Malignant neoplasm metastatic to bone (HCC) 05/24/2024   Normocytic anemia 05/21/2024   Pathologic humeral fracture 05/21/2024   Incomplete paraplegia (HCC) 05/17/2024   Papillary thyroid  carcinoma (HCC) 05/10/2024   Malignant neoplasm metastatic to both lungs (HCC) 05/10/2024   Metastatic cancer to spine (HCC) 05/06/2024   Pulmonary embolism (HCC) 04/28/2024    Resolved Hospital Problems  No resolved problems to display.     Discharge Instructions     Amb Referral to Palliative Care   Complete by: As directed       Allergies as of 06/02/2024   No Known Allergies    Current Facility-Administered Medications (Respiratory):    albuterol  (PROVENTIL ) (2.5 MG/3ML) 0.083% nebulizer solution 2.5 mg   guaiFENesin  (MUCINEX ) 12 hr tablet 1,200 mg  Current Facility-Administered Medications (Analgesics):    acetaminophen  (TYLENOL ) tablet 650 mg **OR** acetaminophen  (TYLENOL ) suppository 650 mg   HYDROmorphone  (DILAUDID ) injection 0.5-1 mg   methadone  (DOLOPHINE ) tablet 10 mg   oxyCODONE  (Oxy IR/ROXICODONE ) immediate release tablet 30 mg  Current Facility-Administered Medications (Hematological):    apixaban  (ELIQUIS ) tablet 5 mg   iron  polysaccharides (NIFEREX) capsule 150 mg  Current Facility-Administered Medications (Other):    bisacodyl  (DULCOLAX) EC tablet 5 mg   cyclobenzaprine  (FLEXERIL ) tablet 10 mg   lidocaine  (LIDODERM ) 5 % 1 patch   methocarbamol  (ROBAXIN ) tablet 750 mg   multivitamin with minerals tablet 1 tablet   ondansetron  (ZOFRAN ) tablet 4 mg **OR** ondansetron  (ZOFRAN ) injection 4 mg   polyethylene glycol (MIRALAX  / GLYCOLAX ) packet 17 g   pregabalin  (LYRICA ) capsule  50 mg   protein supplement (ENSURE MAX) liquid   senna-docusate (Senokot-S) tablet 2 tablet   sodium phosphate  (FLEET) enema 1 enema  No current outpatient medications on file.      Follow-up Information     [place], [facility], PA-C. Schedule an appointment as soon as possible for a visit on 06/03/2024.   Specialty: Orthopedic Surgery Contact information: [address] [place] [address] [phone]                Allergies[1]  Consultations: ENT Oncology Palliative care Neurosurgery Orthopedic surgery  Procedures: 11/29: Right humerus IM rod insertion   Discharge Exam: BP (!) 107/91 (BP Location: Right Wrist)   Pulse 96   Temp 98.4 F (36.9 C)   Resp 19   Ht 5' 9 (1.753 m)   Wt (!) 153 kg   SpO2 93%   BMI 49.81 kg/m  Physical Exam Constitutional:      General: He is not in acute distress.    Appearance: Normal appearance. He is obese.  HENT:     Head: Normocephalic and atraumatic.     Mouth/Throat:     Mouth: Mucous membranes are moist.  Eyes:     Extraocular Movements: Extraocular movements intact.  Cardiovascular:     Rate and Rhythm: Normal rate and regular rhythm.  Pulmonary:     Effort: Pulmonary effort is normal. No respiratory distress.     Breath sounds: Normal breath sounds. No wheezing.  Abdominal:     General: Bowel sounds are normal. There is no distension.     Palpations: Abdomen  is soft.     Tenderness: There is no abdominal tenderness.  Musculoskeletal:     Cervical back: Normal range of motion and neck supple.     Comments: Right arm/shoulder surgical dressings in place; compartments soft  Skin:    General: Skin is warm and dry.  Neurological:     General: No focal deficit present.     Mental Status: He is alert.     Comments: Strength symmetric in all 4 extremities and 5/5  Psychiatric:        Mood and Affect: Mood normal.        Behavior: Behavior normal.      The results of significant diagnostics  from this hospitalization (including imaging, microbiology, ancillary and laboratory) are listed below for reference.   Microbiology: No results found for this or any previous visit (from the past 240 hours).   Labs: BNP (last 3 results) No results for input(s): BNP in the last 8760 hours. Basic Metabolic Panel: Recent Labs  Lab 06/02/24 0404  NA 135  K 3.9  CL 101  CO2 26  GLUCOSE 123*  BUN 11  CREATININE 0.60*  CALCIUM 8.3*   Liver Function Tests: No results for input(s): AST, ALT, ALKPHOS, BILITOT, PROT, ALBUMIN  in the last 168 hours. No results for input(s): LIPASE, AMYLASE in the last 168 hours. No results for input(s): AMMONIA in the last 168 hours. CBC: Recent Labs  Lab 06/02/24 0404  WBC 7.0  HGB 9.9*  HCT 32.5*  MCV 94.2  PLT 94*   Cardiac Enzymes: No results for input(s): CKTOTAL, CKMB, CKMBINDEX, TROPONINI in the last 168 hours. BNP: Invalid input(s): POCBNP CBG: No results for input(s): GLUCAP in the last 168 hours. D-Dimer No results for input(s): DDIMER in the last 72 hours. Hgb A1c No results for input(s): HGBA1C in the last 72 hours. Lipid Profile No results for input(s): CHOL, HDL, LDLCALC, TRIG, CHOLHDL, LDLDIRECT in the last 72 hours. Thyroid  function studies No results for input(s): TSH, T4TOTAL, T3FREE, THYROIDAB in the last 72 hours.  Invalid input(s): FREET3 Anemia work up No results for input(s): VITAMINB12, FOLATE, FERRITIN, TIBC, IRON , RETICCTPCT in the last 72 hours. Urinalysis    Component Value Date/Time   COLORURINE YELLOW 05/22/2024 1522   APPEARANCEUR CLEAR 05/22/2024 1522   LABSPEC 1.020 05/22/2024 1522   PHURINE 6.5 05/22/2024 1522   GLUCOSEU NEGATIVE 05/22/2024 1522   HGBUR NEGATIVE 05/22/2024 1522   BILIRUBINUR NEGATIVE 05/22/2024 1522   KETONESUR NEGATIVE 05/22/2024 1522   PROTEINUR NEGATIVE 05/22/2024 1522   UROBILINOGEN 1.0 04/23/2012 1842    NITRITE NEGATIVE 05/22/2024 1522   LEUKOCYTESUR NEGATIVE 05/22/2024 1522   Sepsis Labs Recent Labs  Lab 06/02/24 0404  WBC 7.0   Microbiology No results found for this or any previous visit (from the past 240 hours).  Procedures/Studies: CT SOFT TISSUE NECK W CONTRAST Result Date: 05/26/2024 EXAM: CT NECK WITH CONTRAST 05/25/2024 03:14:00 PM TECHNIQUE: CT of the neck was performed with the administration of 75 mL of iohexol  (OMNIPAQUE ) 350 MG/ML injection. Multiplanar reformatted images are provided for review. Automated exposure control, iterative reconstruction, and/or weight based adjustment of the mA/kV was utilized to reduce the radiation dose to as low as reasonably achievable. COMPARISON: None available. CLINICAL HISTORY: Thyroid  cancer. FINDINGS: AERODIGESTIVE TRACT: No discrete mass. No edema. SALIVARY GLANDS: The parotid and submandibular glands are unremarkable. THYROID : Markedly enlarged left lobe of the thyroid  gland, which has been previously sampled by FNA. LYMPH NODES: No suspicious cervical lymphadenopathy. SOFT  TISSUES: No mass or fluid collection. BRAIN, ORBITS, SINUSES AND MASTOIDS: No acute abnormality. LUNGS AND MEDIASTINUM: No acute abnormality. BONES: Posterior fusion hardware of the upper thoracic spine. There is a lucent lesion within the C6 vertebra replacing most of the normal bone marrow. Smaller lesion noted within the dorsal C5 vertebral body. No current evidence of pathologic fracture. IMPRESSION: 1. Markedly enlarged left thyroid  lobe, previously sampled by FNA. 2. C5 and C6 vertebral body metastases without pathologic fracture. 3. No cervical lymphadenopathy. Electronically signed by: Franky Stanford MD 05/26/2024 12:37 AM EST RP Workstation: [id number]   DG CHEST PORT 1 VIEW Result Date: 05/22/2024 CLINICAL DATA:  Respiratory compromise EXAM: PORTABLE CHEST 1 VIEW COMPARISON:  Chest x-ray 05/22/2024 FINDINGS: Lung volumes are low. This likely accentuating central  pulmonary vascularity. There is no lung consolidation, pleural effusion or pneumothorax. Cardiomediastinal silhouette is within normal limits. Cervical/thoracic spinal fusion hardware is present. No acute fractures are seen. IMPRESSION: Low lung volumes. No acute cardiopulmonary process. Electronically Signed   By: Greig Pique M.D.   On: 05/22/2024 22:52   DG CHEST PORT 1 VIEW Result Date: 05/22/2024 EXAM: 1 VIEW(S) XRAY OF THE CHEST 05/22/2024 03:42:00 PM COMPARISON: 05/12/2024 CLINICAL HISTORY: Fever FINDINGS: LUNGS AND PLEURA: Low lung volumes. Elevated right hemidiaphragm. Bilateral pulmonary nodules better visualized on prior chest CT. Mild pulmonary vascular congestion is slightly improved from the prior exam. No pleural effusion. No pneumothorax. HEART AND MEDIASTINUM: No acute abnormality of the cardiac and mediastinal silhouettes. BONES AND SOFT TISSUES: Cervicothoracic surgical hardware noted. No acute osseous abnormality. IMPRESSION: 1. Mild pulmonary vascular congestion, slightly improved from the prior exam. 2. Otherwise stable exam Electronically signed by: Lonni Necessary MD 05/22/2024 07:57 PM EST RP Workstation: [id number]   DG Humerus Right Result Date: 05/21/2024 CLINICAL DATA:  Operative imaging from right proximal humerus ORIF. EXAM: RIGHT HUMERUS - 2+ VIEW; DG C-ARM 1-60 MIN-NO REPORT COMPARISON:  05/12/2024. FLUOROSCOPY: Exposure Index (as provided by the fluoroscopic device): 18.84 mGy Kerma FINDINGS: Five submitted images show placement an intramedullary rod humeral head to the distal metadiaphysis. Rod and associated fixation screws are well seated. Proximal pathologic fracture is well aligned. IMPRESSION: 1. Intraoperative fluoroscopy provided for right humeral ORIF. Electronically Signed   By: Alm Parkins M.D.   On: 05/21/2024 18:56   DG C-Arm 1-60 Min-No Report Result Date: 05/21/2024 Fluoroscopy was utilized by the requesting physician.  No radiographic  interpretation.   DG C-Arm 1-60 Min-No Report Result Date: 05/21/2024 Fluoroscopy was utilized by the requesting physician.  No radiographic interpretation.   DG Humerus Right Result Date: 05/20/2024 EXAM: 1 VIEW(S) XRAY OF THE RIGHT HUMERUS 05/20/2024 01:46:00 PM COMPARISON: MRI shoulder 05/19/2024 and right shoulder radiograph 05/01/2024. CLINICAL HISTORY: Right shoulder pain. FINDINGS: BONES AND JOINTS: Lytic lesion involving the surgical neck of the right proximal humerus with pathologic fracture. Mild displacement. Periosteal reaction at the fracture site. The glenohumeral and acromioclavicular joints and elbow are approximated. No joint dislocation. SOFT TISSUES: Numerous linear metallic densities in the soft tissues of the right upper arm and distal arm compatible with needle fragments. Soft tissue swelling surrounding the proximal humerus. Areas of pneumatosis in the soft tissues. IMPRESSION: 1. Lytic lesion involving the surgical neck of the right proximal humerus with pathologic fracture. Mild displacement is new since the prior radiograph. 2. Soft tissue swelling surrounding the proximal humerus with areas of soft tissue swelling and periosteal reaction at the fracture site. 3. Numerous linear metallic densities in the soft tissues of the right upper  arm and distal arm compatible with needle fragments. Electronically signed by: Donnice Mania MD 05/20/2024 02:06 PM EST RP Workstation: [id number]   MR SHOULDER RIGHT WO CONTRAST Result Date: 05/20/2024 EXAM: MRI OF THE RIGHT SHOULDER WITHOUT CONTRAST 05/19/2024 12:04:39 PM TECHNIQUE: Multiplanar multisequence MRI of the right shoulder was performed without the administration of intravenous contrast. COMPARISON: Radiograph 05/01/2024. 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 FINDINGS: ROTATOR CUFF: Intact supraspinatus, infraspinatus, subscapularis and teres minor  tendons. Edema signal in the teres minor muscle. No significant muscle atrophy. BICEPS TENDON: Long head biceps tendon is intact and normally located. LABRUM: No gross labral tear or paralabral cyst; motion artifact precludes sensitive assessment of the labrum. GLENOHUMERAL JOINT: Physiologic amount of joint fluid. No high-grade cartilage loss. Normal alignment. AC JOINT AND ACROMIOCLAVICULAR ARCH: No significant acromial downsloping or subacromial spur. No significant degenerative changes. Intact acromioclavicular and coracoclavicular ligaments. BURSA: No significant subacromial/subdeltoid bursitis. BONE MARROW: Pathologic transverse fracture of the proximal humerus just below the surgical neck with moderate apex anterolateral angulation associated with a 6.0 x 3.7 x 4.1 cm metastatic lesion of the right proximal humerus. The lesion is mildly expansile and has high T2 and low T1 signal in a relatively homogeneous pattern. OUTLET SPACES: Normal MRI appearance of the quadrilateral space. No significant narrowing of the supraspinatus outlet. SOFT TISSUES: Edema tracks along fascial planes around the fracture site. Edema signal proximally in the long head of the triceps. Nonspecific edema tracks along the superficial fascial margin of the posterolateral deltoid. LIMITATIONS/ARTIFACTS: Motion artifact is present, reducing diagnostic sensitivity and specificity. IMPRESSION: 1. Pathologic transverse fracture of the proximal humerus just below the surgical neck with moderate apex anterolateral angulation, associated with a 6.0 x 3.7 x 4.1 cm metastatic lesion of the right proximal humerus. Edema tracks along fascial planes around the fracture site. 2. Edema signal in the teres minor muscle and proximally in the long head of the triceps. 3. Nonspecific edema along the superficial fascial margin of the posterolateral deltoid. Electronically signed by: Ryan Salvage MD 05/20/2024 10:36 AM EST RP Workstation: [id number]    DG CHEST PORT 1 VIEW Result Date: 05/12/2024 EXAM: 1 VIEW(S) XRAY OF THE CHEST 05/12/2024 09:52:22 AM COMPARISON: 05/06/2024 CLINICAL HISTORY: Fever FINDINGS: LINES, TUBES AND DEVICES: Left PICC in place with tip overlying superior aspect of SVC. LUNGS AND PLEURA: Small bilateral pulmonary nodules, unchanged from previous exam. . No pleural effusion. No pneumothorax. HEART AND MEDIASTINUM: No acute abnormality of the cardiac and mediastinal silhouettes. BONES AND SOFT TISSUES: Cervicothoracic surgical hardware. No acute osseous abnormality. IMPRESSION: 1. No acute cardiopulmonary pathology. 2. Small bilateral pulmonary nodules are similar to the previous exam, better visualized on CT of the chest from 04/02/2024. Electronically signed by: Waddell Calk MD 05/12/2024 12:40 PM EST RP Workstation: [id number]   US  EKG SITE RITE Result Date: 05/07/2024 If Site Rite image not attached, placement could not be confirmed due to current cardiac rhythm.  DG CHEST PORT 1 VIEW Result Date: 05/06/2024 CLINICAL DATA:  Left PICC line infiltration.  Metastatic disease. EXAM: PORTABLE CHEST 1 VIEW COMPARISON:  05/05/2024. CT chest, abdomen and pelvis dated 05/02/2024. FINDINGS: The left PICC tip is poorly visualized and appears to be in the region of the superior cavoatrial junction. Normal sized heart. Multiple small, faintly visible bilateral pulmonary nodules, similar to the previous CT. Mild-to-moderate peribronchial thickening. Upper thoracic spine laminectomy defect with fixation hardware and corpectomy spacer. IMPRESSION: 1. The left PICC tip is  poorly visualized and appears to be in the region of the superior cavoatrial junction. 2. Mild to moderate bronchitic changes. 3. Multiple small, faintly visible bilateral pulmonary nodules, similar to the previous CT, compatible with possible metastatic disease. Electronically Signed   By: Elspeth Bathe M.D.   On: 05/06/2024 17:17   CT THORACIC SPINE WO CONTRAST Result  Date: 05/06/2024 EXAM: CT THORACIC SPINE WITHOUT CONTRAST 05/06/2024 12:01:33 PM TECHNIQUE: CT of the thoracic spine was performed without the administration of intravenous contrast. Multiplanar reformatted images are provided for review. Automated exposure control, iterative reconstruction, and/or weight based adjustment of the mA/kV was utilized to reduce the radiation dose to as low as reasonably achievable. COMPARISON: Preoperative thoracic MRI and CT on 05/01/2024 and 05/02/2024. Postoperative MRI reported on 05/06/2024. Cervical spine CT on 05/02/2024. CLINICAL HISTORY: 43 year old male, status post upper thoracic corpectomy, decompression, and fusion. Pathologic fracture, vertebra plana with enhancing mass-like soft tissue at the T2 thoracic level. Evidence of other metastatic disease on CT chest, abdomen, and pelvis . surgical pathology pending. FINDINGS: Normal thoracic and lumbar vertebral segmentation on CT chest, abdomen, and pelvis. BONES AND ALIGNMENT: Sequelae of T2 corpectomy, posterior decompression at T1-T2 and T2-T3, and posterior fusion hardware in place with pedicle screws bilaterally at T1 and T3. No unexpected osseous changes. Lytic lesion partially visible in the C6 vertebral body. No new osseous abnormality identified. SOFT TISSUES: Small volume postoperative gas in and around the decompression site, in the left upper thoracic epidural space. Postoperative drain in place from a right percutaneous approach terminating in the posterior paraspinal soft tissues. Left upper extremity approach PICC line type catheter in place, terminating at the superior cavoatrial junction. Heterogeneous enlarged left thyroid  lobe. Numerous small gallstones. Right greater than left dependent pulmonary atelectasis. Superimposed multiple small bilateral pulmonary nodules, individually up to 9 mm in diameter, most compatible with pulmonary metastases. IMPRESSION: 1. T1 through T3 decompression and fusion with T2  corpectomy. Posterior post-operative drain in place. No adverse features by CT. No new osseous abnormality identified in the thoracic spine. 2. Heterogeneous enlarged left thyroid . Partially visible known pulmonary metastases and lytic lesion of the C6 vertebral body 3. Gallstones. Electronically signed by: Helayne Hurst MD 05/06/2024 12:58 PM EST RP Workstation: [id number]   MR THORACIC SPINE W WO CONTRAST Result Date: 05/06/2024 CLINICAL DATA:  Follow-up examination is status post surgery. EXAM: MRI THORACIC WITHOUT AND WITH CONTRAST TECHNIQUE: Multiplanar and multiecho pulse sequences of the thoracic spine were obtained without and with intravenous contrast. CONTRAST:  10mL GADAVIST  GADOBUTROL  1 MMOL/ML IV SOLN COMPARISON:  Comparison made with prior MRI from 05/01/2024. FINDINGS: Alignment: Stable alignment with preservation of the normal thoracic kyphosis. Underlying trace dextroscoliosis. No interval listhesis or malalignment. Vertebrae: Postoperative changes from interval posterior decompression and fusion at T1 through T3 with T2 corpectomy. The metastatic lesion at T2 has largely been resected, although residual tumor is noted along the anterolateral aspect of the vertebral column, worse on the left (series 17, images 7, 10, 14 for example). No visible residual spinal stenosis, although the operative level is obscured by susceptibility artifact. No visible complication. Vertebral body height otherwise maintained with no other acute or chronic fracture. Decreased T1 signal intensity throughout the visualized bone marrow. Small benign hemangioma noted within the T6 vertebral body. No other worrisome osseous lesions or evidence for metastatic disease. Cord: Upper thoracic cord is obscured at the level of T2 due to susceptibility artifact. Otherwise, normal signal morphology. No abnormal enhancement. Paraspinal and other soft tissues:  Postoperative changes within the upper posterior paraspinous soft tissues  at T2. No loculated collection or adverse features. Small layering bilateral pleural effusions, right greater than left. Disc levels: No significant disc pathology seen within the underlying thoracic spine. No other stenosis or neural impingement. IMPRESSION: 1. Postoperative changes from interval posterior instrumentation with decompression and fusion at T1 through T3, with T2 corpectomy. No visible residual spinal stenosis, although the operative level is largely obscured by susceptibility artifact. No complication. 2. No other evidence for metastatic disease within the thoracic spine. 3. Small layering bilateral pleural effusions, right greater than left. Electronically Signed   By: Morene Hoard M.D.   On: 05/06/2024 03:46   DG Thoracic Spine 2 View Result Date: 05/05/2024 CLINICAL DATA:  Back pain. EXAM: THORACIC SPINE 2 VIEWS COMPARISON:  Chest radiograph dated 05/05/2024. FINDINGS: No acute fracture or subluxation of the thoracic spine. With upper thoracic posterior fusion noted. The soft tissues are unremarkable. IMPRESSION: 1. No acute findings. 2. Upper thoracic posterior fusion. Electronically Signed   By: Vanetta Chou M.D.   On: 05/05/2024 21:14   DG Chest 1 View Result Date: 05/05/2024 CLINICAL DATA:  PICC placement. EXAM: CHEST  1 VIEW COMPARISON:  Chest radiograph dated 05/02/2024. FINDINGS: Left-sided PICC with tip at the cavoatrial junction. There is eventration of the right hemidiaphragm. No focal consolidation, pleural effusion or pneumothorax. The cardiac silhouette is within normal limits. No acute osseous pathology. IMPRESSION: Left-sided PICC with tip at the cavoatrial junction. Electronically Signed   By: Vanetta Chou M.D.   On: 05/05/2024 21:13   DG Cervical Spine 2 or 3 views Result Date: 05/05/2024 CLINICAL DATA:  Elective surgery. EXAM: CERVICAL SPINE - 2-3 VIEW COMPARISON:  Preoperative imaging. FINDINGS: Six fluoroscopic spot views of the lower cervical and  upper thoracic spine submitted from the operating room. Imaging obtained during posterior fusion C7 through T4 with T2 corpectomy. Fluoroscopy time 52.1 seconds. Dose 37.89 mGy. IMPRESSION: Intraoperative fluoroscopy during cervicothoracic surgery. Electronically Signed   By: Andrea Gasman M.D.   On: 05/05/2024 16:38   DG C-Arm 1-60 Min-No Report Result Date: 05/05/2024 Fluoroscopy was utilized by the requesting physician.  No radiographic interpretation.   DG C-Arm 1-60 Min-No Report Result Date: 05/05/2024 Fluoroscopy was utilized by the requesting physician.  No radiographic interpretation.   DG C-Arm 1-60 Min-No Report Result Date: 05/05/2024 Fluoroscopy was utilized by the requesting physician.  No radiographic interpretation.   DG C-Arm 1-60 Min-No Report Result Date: 05/05/2024 Fluoroscopy was utilized by the requesting physician.  No radiographic interpretation.   DG C-Arm 1-60 Min-No Report Result Date: 05/05/2024 Fluoroscopy was utilized by the requesting physician.  No radiographic interpretation.   DG C-Arm 1-60 Min-No Report Result Date: 05/05/2024 Fluoroscopy was utilized by the requesting physician.  No radiographic interpretation.   DG O-ARM IMAGE ONLY/NO REPORT Result Date: 05/05/2024 There is no Radiologist interpretation  for this exam.  US  THYROID  FNA EACH NODULE Result Date: 05/04/2024 INDICATION: [id number] Thyroid  mass [id number] Indeterminate thyroid  nodule EXAM: ULTRASOUND GUIDED FINE NEEDLE ASPIRATION OF INDETERMINATE THYROID  NODULE COMPARISON:  U/S thyroid  on 04/29/2024 CT CAP, 05/02/2024. MEDICATIONS: 4 cc of 1% lidocaine  COMPLICATIONS: None immediate. TECHNIQUE: Informed written consent was obtained from the patient after a discussion of the risks, benefits and alternatives to treatment. Questions regarding the procedure were encouraged and answered. A timeout was performed prior to the initiation of the procedure. Pre-procedural ultrasound scanning  demonstrated unchanged size and appearance of the indeterminate nodule within the left lobe of  the thyroid . The procedure was planned. The neck was prepped in the usual sterile fashion, and a sterile drape was applied covering the operative field. A timeout was performed prior to the initiation of the procedure. Local anesthesia was provided with 1% lidocaine . Under direct ultrasound guidance, 5 FNA biopsies were performed of the left thyroid  nodule with a 25 gauge needle. Multiple ultrasound images were saved for procedural documentation purposes. The samples were prepared and submitted to pathology. Limited post procedural scanning was negative for hematoma or additional complication. Dressings were placed. The patient tolerated the above procedures procedure well without immediate postprocedural complication. FINDINGS: Nodule reference number based on prior diagnostic ultrasound: 1 Maximum size: 8.2 cm Location: Left; occupies majority of lobe ACR TI-RADS risk category: TR4 (4-6 points) Reason for biopsy: meets ACR TI-RADS criteria Ultrasound imaging confirms appropriate placement of the needles within the thyroid  nodule. IMPRESSION: Successful ultrasound guided FNA biopsy of a dominant 8.2 cm LEFT TR-4 thyroid  nodule Procedure performed by Carlin Griffon, PA-C under direct supervision of Thom Hall, MD Electronically Signed   By: Thom Hall M.D.   On: 05/04/2024 17:03     Time coordinating discharge: Over 30 minutes    Alm Apo, MD  [facility] 06/02/2024, 12:19 PM    [1] No Known Allergies

## 2024-06-02 NOTE — Assessment & Plan Note (Signed)
 Evaluation for RAI by endocrinology after thyroidectomy

## 2024-06-02 NOTE — Hospital Course (Signed)
 Attempted to see patient this AM but off the floor for radiation.  I saw him briefly this afternoon and he reports he is feeling better overall with pain management and is looking forward to getting back to CIR this afternoon/evening.  Denies other needs.    Please call or secure chat if we can be of further assistance of Joseph [name] moving forward.  Amaryllis Meissner, MD [facility] Palliative Medicine Team [phone]  NO CHARGE NOTE

## 2024-06-02 NOTE — Assessment & Plan Note (Signed)
 05/05/24: Posterior T1-T3 instrumentation and fusion, T1-T3 laminectomy, T2 corpectomy for resection of metastatic tumor  Pathology showed papillary thyroid  carcinoma

## 2024-06-02 NOTE — H&P (Signed)
 Physical Medicine and Rehabilitation Admission H&P    Chief complaint: Pathological compression causing incomplete paraplegia/SCI and right humerus fracture due to pathological fx   HPI: Joseph [name] is a 43 year old male with PMH of asthma, anxiety, depression, with history of polysubstance use disorder  who presented to [facility] on 04/27/2024 due to worsening low back pain, rib pain for several weeks. Due to pain, he endorsed difficulty walking and saddle anesthesia. In ED he was hemodynamically stable, A chest xray showed possible LUL nodule and degenerative changes in the lumbar spine. Right should x-ray showed humeral lucency in the proximal humerus, differential considerations included metastatic disease or multiple myeloma. CT scan showed a large Schmorl's node at the inferior L5 endplate. CT angiogram of chest showed right upper lobe segmental pulmonary embolism with incidental findings of right lower lobe nodules, enlarged and heterogeneous left thyroid  lobe measuring up to 5 cm with right tracheal deviation. Nonemergent thyroid  ultrasound recommended. Heparin  was initiated for pulmonary embolism, patient transferred to [facility] for admission.   Due to difficulty with ambulation and LE sensory changes, neurology was consulted and recommended full spine MRI which showed a pathologic fracture at T2 with several spinal canal stenosis and cord compression L > R with subtle thoracic cord signal change suggesting early cord edema. He was transferred to [facility] on 11/9 for neurosurgery consultation and operative intervention after eliquis  wash out. Patient underwent a posterior T1-3 instrumentation and fusion with T1-3 laminectomy, T2 corpectomy for resection of metastatic tumor by Dr. Darnella on 11/13. IR performed a ultrasound guided needle aspiration of thyroid  mass that revealed papillary carcinoma, surgical path from T2 confirmed metastatic papillary thyroid   carcinoma. Oncology was consulted and reccommended outpatient follow up with ENT, radiation oncology, and endocrinology.   Palliative care consulted for goals of care and pain management. A prevena suction vac was placed by WOC on 11/24, discontinued on 11/27. He continues on Eliquis . On 11/17 he developed a fever where a workup was negative, WBC trended down. He was admitted to CIR on 11/24 where he was significantly limited by right shoulder pain, initially suspected to be rotator cuff pathology. A MRI completed on 11/29 showed a large tumor with associated pathological fracture of the proximal right humerus. He was admitted back to acute care services. Dr. Dozier performed IMN of R humerus on 11/29. Post op he was WBAT to the RUE. Hospital course complicated by chronic pain with palliative care was consulted for Radiation oncology consulted for palliative radiation to the spine and humerus with plans for 2-week course beginning 12/11 to the spine and 12/18 to the humerus.  He will follow-up with oncology as outpatient for systemic treatment. Palliative care continues to follow of pain/symptom management.   Prior to arrival the patient was independent and driving without use of DME. He currently requires min assist with mobility and basic ADLs.  Therapy evaluations completed due to patient decreased functional mobility was admitted for a comprehensive rehab program.     Pt reports LBM 2 days ago- but doesn't feel constipated Voiding on his own So tired this afternoon after radiation this AM- said hadn't had IV Dilaudid  this afternoon to make him so tired.  Same old pain, but is somewhat better in RUE- and overall a little better than when was in CIR last.    Review of Systems  Constitutional:  Positive for malaise/fatigue.  HENT: Negative.    Eyes: Negative.   Respiratory:  Positive  for cough. Negative for sputum production and shortness of breath.   Cardiovascular:  Negative for chest pain  and leg swelling.  Gastrointestinal:  Negative for constipation and nausea.       LBM 2 days ago  Genitourinary: Negative.        Voiding on his own  Musculoskeletal:  Positive for back pain, joint pain, myalgias and neck pain.  Skin: Negative.   Neurological:  Positive for tingling, focal weakness and weakness. Negative for sensory change.  Endo/Heme/Allergies: Negative.   Psychiatric/Behavioral:  Positive for depression.   All other systems reviewed and are negative.       Past Medical History:  Diagnosis Date   Anxiety    Asthma    as a child   Depression    Narcotic abuse (HCC)    Pulmonary nodules 04/29/2024   Past Surgical History:  Procedure Laterality Date   CLOSED REDUCTION FINGER WITH PERCUTANEOUS PINNING Right 06/22/2013   Procedure: RIGHT HAND SMALL FINGER CLOSED MANIPULATION AND PINNING;  Surgeon: Prentice LELON Pagan, MD;  Location: [facility] OR;  Service: Orthopedics;  Laterality: Right;   HUMERUS IM NAIL Right 05/21/2024   Procedure: INSERTION, INTRAMEDULLARY ROD, HUMERUS;  Surgeon: Dozier Soulier, MD;  Location: [facility] OR;  Service: Orthopedics;  Laterality: Right;   I & D EXTREMITY  03/24/2012   Procedure: IRRIGATION AND DEBRIDEMENT EXTREMITY;  Surgeon: Oneil JAYSON Herald, MD;  Location: [facility] OR;  Service: Orthopedics;  Laterality: Right;  Right Forearm   POSTERIOR CERVICAL FUSION/FORAMINOTOMY N/A 05/05/2024   Procedure: POSTERIOR CERVICAL FUSION CERVICAL SEVEN-THORACIC ONE, THORACIC ONE-THORACIC TWO, THORACIC TWO-THORACIC THREE ,FOR RESECTION OF TUMOR THORACIC TWO CORPECTOMY;  Surgeon: Darnella Dorn SAUNDERS, MD;  Location: [facility] OR;  Service: Neurosurgery;  Laterality: N/A;   Family History  Problem Relation Age of Onset   Other Other    Alcohol abuse Other    Social History:  reports that he has been smoking cigarettes. He has a 10 pack-year smoking history. He has never used smokeless tobacco. He reports that he does not currently use alcohol. He reports current drug use. Drug:  Marijuana. Allergies: Allergies[1] Medications Prior to Admission  Medication Sig Dispense Refill   apixaban  (ELIQUIS ) 5 MG TABS tablet Take 5-10 mg by mouth 2 (two) times daily. Was taking 10mg  by mouth twice daily. Instructions to start 5mg  by mouth twice daily on 05/21/29.     cyclobenzaprine  (FLEXERIL ) 10 MG tablet Take 10 mg by mouth 3 (three) times daily.     Ensure Max Protein (ENSURE MAX PROTEIN) LIQD Take 11 oz by mouth 2 (two) times daily.     methocarbamol  (ROBAXIN ) 750 MG tablet Take 750 mg by mouth 4 (four) times daily.     Multiple Vitamin (MULTIVITAMIN) tablet Take 1 tablet by mouth daily.     oxyCODONE  HCl (OXYCONTIN  PO) Take 100 mg by mouth every 8 (eight) hours.     polyethylene glycol (MIRALAX  / GLYCOLAX ) 17 g packet Take 17 g by mouth 2 (two) times daily.     pregabalin  (LYRICA ) 50 MG capsule Take 50 mg by mouth 3 (three) times daily.     sennosides-docusate sodium  (SENOKOT-S) 8.6-50 MG tablet Take 2 tablets by mouth in the morning and at bedtime.        Home: Home Living Family/patient expects to be discharged to:: Private residence Living Arrangements: Spouse/significant other Available Help at Discharge: Family, Available 24 hours/day Type of Home: House Home Access: Stairs to enter [employer] of Steps: 2 Entrance Stairs-Rails: None  Home Layout: One level Bathroom Shower/Tub: Health Visitor: Handicapped height Home Equipment: Cane - single point, Information systems manager, BSC/3in1   Functional History: Prior Function Prior Level of Function : Independent/Modified Independent, History of Falls (last six months) Mobility Comments: indep, no AD, 1 recent fall due to BLEs giving out ADLs Comments: indep, drives  Functional Status:  Mobility: Bed Mobility Overal bed mobility: Needs Assistance Bed Mobility: Sit to Sidelying, Rolling Rolling: Supervision Sidelying to sit: Supervision Supine to sit: Supervision, HOB elevated, Used rails Sit  to supine: Mod assist, Used rails Sit to sidelying: Min assist, Used rails General bed mobility comments: Assist for BLE over edge of bed, cues for log roll, pt using rail PRN to return to supine Transfers Overall transfer level: Needs assistance Equipment used: Right platform walker Transfers: Sit to/from Stand, Bed to chair/wheelchair/BSC Sit to Stand: Min assist, From elevated surface, +2 safety/equipment Bed to/from chair/wheelchair/BSC transfer type:: Step pivot Step pivot transfers: Min assist, +2 safety/equipment Transfer via Lift Equipment: Stedy General transfer comment: chair>RW and RW> EOB via step pivot toward his R, pt able to maintain standing a couple mins while bed linens changed by NT. Ambulation/Gait Ambulation/Gait assistance: Contact guard assist, +2 safety/equipment Gait Distance (Feet): 40 Feet Assistive device: Right platform walker Gait Pattern/deviations: Step-through pattern, Decreased stride length, Narrow base of support General Gait Details: pt defers due to pain/fatigue, and visitors arriving to his room with his dinner while pt returned to bed Gait velocity: decr    ADL: ADL Overall ADL's : Needs assistance/impaired Eating/Feeding: Set up, Sitting Eating/Feeding Details (indicate cue type and reason): for cutting up food Grooming: Oral care, Wash/dry face, Set up, Sitting Grooming Details (indicate cue type and reason): Setup to wash face and ears, Mod A to don deodorant - assist to don under L arm. Pt able to use LUE to don under R arm Upper Body Bathing: Moderate assistance, Sitting Upper Body Bathing Details (indicate cue type and reason): Mod A for posterior bathing Lower Body Bathing: Maximal assistance, Sit to/from stand, Sitting/lateral leans Lower Body Bathing Details (indicate cue type and reason): assist to bathe bottom in standing Upper Body Dressing : Maximal assistance Upper Body Dressing Details (indicate cue type and reason): Max A to  doff UB dressing Lower Body Dressing: Maximal assistance, Bed level Lower Body Dressing Details (indicate cue type and reason): max A don socks bed level Toilet Transfer: Moderate assistance, +2 for physical assistance, +2 for safety/equipment, Stand-pivot (R platform RW) Toilet Transfer Details (indicate cue type and reason): STS with stedy this session Toileting- Clothing Manipulation and Hygiene: Maximal assistance Toileting - Clothing Manipulation Details (indicate cue type and reason): Pt used bedside urinal while supine  Cognition: Cognition Orientation Level: Oriented X4 Cognition Arousal: Alert Behavior During Therapy: WFL for tasks assessed/performed  Physical Exam: Blood pressure (!) 107/91, pulse 96, temperature 98.4 F (36.9 C), resp. rate 19, height 5' 9 (1.753 m), weight (!) 153 kg, SpO2 93%. Physical Exam Vitals and nursing note reviewed.  Constitutional:      Appearance: He is obese.     Comments: Awake, but drowsing.very sleepy- hard to stay awake, appropriate, NAD  HENT:     Head: Normocephalic and atraumatic.     Right Ear: External ear normal.     Left Ear: External ear normal.     Nose: Nose normal. No congestion.     Mouth/Throat:     Mouth: Mucous membranes are dry.     Pharynx: Oropharynx is clear.  No oropharyngeal exudate.  Eyes:     General:        Right eye: No discharge.        Left eye: No discharge.     Extraocular Movements: Extraocular movements intact.  Cardiovascular:     Rate and Rhythm: Normal rate and regular rhythm.     Heart sounds: Normal heart sounds. No murmur heard.    No gallop.  Pulmonary:     Effort: Pulmonary effort is normal. No respiratory distress.     Breath sounds: Normal breath sounds. No wheezing, rhonchi or rales.     Comments: Rare cough- non productive Abdominal:     Comments: Protuberant; NT, ND (same size as when saw last)- slightly hypoactive BS  Musculoskeletal:     Cervical back: Neck supple.     Comments:  LUE 5/5 RUE- at least 3/5 proximally; and 5-/5 distally- limited by pain and surgery of proximal humerus RLE- HF 3-/5; KE/KF 4+/5; DF 4/5 and PF 3-/5 LLE- HF 3+/5; KE/KF 5-/5 and DF/PF 5-/5  Skin:    General: Skin is warm and dry.     Comments: Bandage seen on R proximal arm- c/d/I Didn't assess thoracic incision- due to pt's sleepiness-didn't turn over  Neurological:     Comments: 5-6 beats clonus B/L No hoffman's MAS of 1 in LE's- much better Intact to light touch in all 4 extremities  Psychiatric:     Comments: Sleepy, but more interactive than last time I saw him     Results for orders placed or performed during the hospital encounter of 05/21/24 (from the past 48 hours)  CBC     Status: Abnormal   Collection Time: 06/02/24  4:04 AM  Result Value Ref Range   WBC 7.0 4.0 - 10.5 K/uL   RBC 3.45 (L) 4.22 - 5.81 MIL/uL   Hemoglobin 9.9 (L) 13.0 - 17.0 g/dL    Comment: REPEATED TO VERIFY   HCT 32.5 (L) 39.0 - 52.0 %   MCV 94.2 80.0 - 100.0 fL   MCH 28.7 26.0 - 34.0 pg   MCHC 30.5 30.0 - 36.0 g/dL   RDW 86.1 88.4 - 84.4 %   Platelets 94 (L) 150 - 400 K/uL    Comment: CONSISTENT WITH PREVIOUS RESULT REPEATED TO VERIFY REPEATED TO VERIFY Immature Platelet Fraction may be clinically indicated, consider ordering this additional test [id number]    nRBC 0.0 0.0 - 0.2 %    Comment: Performed at [facility], [address], [place], [place] [address]  Basic metabolic panel     Status: Abnormal   Collection Time: 06/02/24  4:04 AM  Result Value Ref Range   Sodium 135 135 - 145 mmol/L   Potassium 3.9 3.5 - 5.1 mmol/L   Chloride 101 98 - 111 mmol/L   CO2 26 22 - 32 mmol/L   Glucose, Bld 123 (H) 70 - 99 mg/dL    Comment: Glucose reference range applies only to samples taken after fasting for at least 8 hours.   BUN 11 6 - 20 mg/dL   Creatinine, Ser 9.39 (L) 0.61 - 1.24 mg/dL   Calcium 8.3 (L) 8.9 - 10.3 mg/dL   GFR, Estimated >39 >39 mL/min    Comment: (NOTE) Calculated  using the CKD-EPI Creatinine Equation (2021)    Anion gap 8 5 - 15    Comment: Performed at [facility], [address] [place], [place] [address]   No results found.  Blood pressure (!) 107/91, pulse 96, temperature 98.4 F (36.9 C), resp. rate 19, height 5' 9 (1.753 m), weight (!) 153 kg, SpO2 93%.  Medical Problem List and Plan: 1. 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   -patient may  shower  -ELOS/Goals: ~ 14 days- supervision  Admit to CIR  Appropriate for CIR- didn't finish first time due to R humeral pathological fx due to Thyroid  cancer mets 2.  Antithrombotics: -DVT/anticoagulation:  Mechanical: Sequential compression devices, below knee Bilateral lower extremities Pharmaceutical: Eliquis   -antiplatelet therapy: N/A  3. Pain Management: Robaxin  750 TID AC/HS, Lyrica  50 mg TID, Methadone  10 mg TID. Oxycodone  30 mg q3 hours as needed for breakthrough pain. Palliative care to follow.   4. Mood/Behavior/Sleep: LCSW to follow for evaluation and support when available.   -antipsychotic agents: n/a  5. Neuropsych/cognition: This patient is capable of making decisions on his own behalf.  6. Skin/Wound Care: Routine pressure relief measures. Monitor incisions  7. Fluids/Electrolytes/Nutrition: Monitor I&O and weight. Follow up labs CBC/CMP     8. 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 Completed course of dexamethasone . Patient to follow up outpatient with ENT, endocrinology and radiation oncology. ENT plan to discuss at multidisciplinary tumor board on 06/08/24.   Will likely need Thyroidectomy and RAI 9. Pathological right proximal humerus fracture: s/p IMR 11/29. WBAT in RUE due to needing that- use platform walker.  Ortho follow up in 2 weeks for recheck and updated xrays.  -Patient to remain in sling.  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  Patient underwent instrumentation and fusion on prior admission. Radiation Onc plans 2 weeks of radiotherapy to thoracic spine starts 06/02/24- right humerus on 06/09/24.  11. Hx of PE: dx on 11/6 provoked secondary to metastatic cancer. Continue Eliquis  for 3-6 months.  12. Paraplegia/Neurogenic bladder: Monitor bladder scan with PVR   13. Chronic normocytic anemia: Stable, continue Niferex 150 mg daily   14. Constipation/mild neurogenic bowel: Continue Miralax  and SennaK    Brandi N Lawrence, NP 06/02/2024  I have personally performed a face to face diagnostic evaluation of this patient and formulated the key components of the plan.  Additionally, I have personally reviewed laboratory data, imaging studies, as well as relevant notes and concur with the physician assistant's documentation above.   The patient's status has not changed from the original H&P.  Any changes in documentation from the acute care chart have been noted above.        [1] No Known Allergies

## 2024-06-02 NOTE — Assessment & Plan Note (Signed)
 Secondary to malignancy and recent surgery

## 2024-06-02 NOTE — Assessment & Plan Note (Signed)
 Stage IVC, [id number] Pending evaluation for timing thyroidectomy Will need follow-up with endocrinology.  After thyroidectomy evaluation with endocrinology for RAI.  I have communicated and placed an outpatient referral to endocrinology. Will request NGS for future references

## 2024-06-02 NOTE — Plan of Care (Signed)

## 2024-06-02 NOTE — Assessment & Plan Note (Signed)
 surgical pinning of the right humerus on 05/21/24  Plan for postoperative radiation by radiation oncology

## 2024-06-02 NOTE — Progress Notes (Shared)
 Joseph Bouchard, MD  Physician Physical Medicine and Rehabilitation   PMR Pre-admission    Signed   [name] of Service: 06/02/2024 10:54 AM  Related encounter: Admission (Current) from 05/21/2024 in [facility]   Signed     Expand All Collapse All  PMR Admission Coordinator Pre-Admission Assessment   Patient: Joseph [name] is an 43 y.o., male MRN: [id number] DOB: 1980/07/12 Height: 5' 9 (175.3 cm) Weight: (!) 153 kg   Insurance Information HMO:     PPO:      PCP:      IPA:      80/20:      OTHER:  PRIMARY: [employer]      Policy#: [id number]      Subscriber: pt CM Name: Joseph [name]      Phone#: [phone]     Fax#: [phone] Pre-Cert#: [id number] auth for CIR from Joseph [name] with [employer] for admit 12/10 with next review [name] 12/24.  Updates due to fax listed above.        Employer:  Benefits:  Phone #:     [phone]     Name:  Joseph [name]: [birth year]     Deduct: $0      Out of Pocket Max: $0      Life Max:  CIR: 100%      SNF: 100% Outpatient:      Co-Pay: $4/visit Home Health:       Co-Pay: $4/visit DME: 100%     Co-Pay:  Providers:  SECONDARY:       Policy#:      Phone#:    Artist:       Phone#:    The Best Boy for patients in Inpatient Rehabilitation Facilities with attached Privacy Act Statement-Health Care Records was provided and verbally reviewed with: Patient and Family   Emergency Contact Information Contact Information       Name Relation Home Work Mobile    [place] Mother [phone]             Other Contacts       Name Relation Home Work Mobile    Joseph [name] Father     [phone]           Current Medical History  Patient Admitting Diagnosis: pathological SCI and R humerus fx [name]/p operative fixation of both    History of Present Illness: Pt is a 43 y/o male with PMH of asthma, anxiety, and PSA who presented to [facility] on 11/5 with c/o  low back pain, rib pain for several weeks until he was having difficulty walking and saddle anesthesia. In ED he was hemodynamically stable, and xray showed possible LUL nodule, and degenerative changes in the lumbar spine. CT of lumbar spine showed large Schmorl'[name] node at the L5 endplate. CTA chest showed RUL segmental PE with incidental RLL nodules, enlarge L lobe of the thyroid , and gallstones. He was started on heparin  gtt and admitted to [facility] on 11/6. He continued to have difficulty with ambulation and LE sensory changes so neurology was consulted and recommended full spine MRI which showed a pathologic fracture at T2 with several spinal canal stenosis and cord compression L>R with subtle thoracic cord signal change suggesting early cord edema. Xray shoulder showed humeral lucency concerning for malignancy. He was then transferred to [facility] on 11/9 for neurosurgery consultation. Recommendations were for operative intervention after eliquis  wash out. Pt underwent a posterior T1-3 instrumentation and  fusion, with T1-3 laminectomy, T2 corpectomy for resection of metastatic tumor per dr. Darnella on 11/13. 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 Oncology was consulted and recommend outpatient follow up with ENT, radiation oncology, and endocrinology. Palliative consulted for goals and pain management. He has a prevena vac to his surgical incision which will be d/c'd on 11/27. He has been weaned off of dexamethasone . He is currently on eliquis . He did develop a fever the week of 11/17 but workup was negative and he has been afebrile with WBC trending down. His PICC line was d/c'd on 11/24 in anticipation of transition to CIR and he admitted to CIR on the same day.  On rehab he was significantly limited by suspected rotator cuff pathology, requiring total assist for mobility/adls.  MRI of RUE completed  and read 11/28 which showed large tumor with associated pathological fracture of proximal right humerus.  He was admitted back to the acute setting on 11/28 for intervention.  He underwent a IMN of R humerus per Dr. Dozier on 11/29 and post operatively he is to be WBAT to RUE.  Post op course significant for pain control in the setting of PMH of PSA, and planning/coordination of cancer treatment.  Radiation oncology consulted for palliative radiation to the spine and humerus.  Plan for 2 week course beginning 12/11 to the spine and 12/18 to the humerus.  He will f/u with oncology as an outpatient for any systemic treatment.  Therapy ongoing and pt has made remarkable progress with recommendations to return to CIR.    Patient'[name] medical record from Joseph [name] has been reviewed by the rehabilitation admission coordinator and physician.   Past Medical History      Past Medical History:  Diagnosis [name]   Anxiety     Asthma      as a child   Depression     Narcotic abuse (HCC)     Pulmonary nodules 04/29/2024          Has the patient had major surgery during 100 days prior to admission? Yes   Family History   family history includes Alcohol abuse in an other family member; Other in an other family member.   Current Medications Current Medications    Current Facility-Administered Medications:    acetaminophen  (TYLENOL ) tablet 650 mg, 650 mg, Oral, Q6H PRN, 650 mg at 05/23/24 0819 **OR** acetaminophen  (TYLENOL ) suppository 650 mg, 650 mg, Rectal, Q6H PRN, Joseph [name], Joseph S, MD   albuterol  (PROVENTIL ) (2.5 MG/3ML) 0.083% nebulizer solution 2.5 mg, 2.5 mg, Nebulization, Q6H PRN, Joseph [name], Joseph S, MD   apixaban  (ELIQUIS ) tablet 5 mg, 5 mg, Oral, BID, Joseph [name], Joseph [name], RPH, 5 mg at 06/02/24 1027   bisacodyl  (DULCOLAX) EC tablet 5 mg, 5 mg, Oral, Daily PRN, Joseph [name], Joseph S, MD   cyclobenzaprine  (FLEXERIL ) tablet 10 mg, 10 mg, Oral, TID, Joseph [name], Joseph S, MD, 10 mg at 06/02/24 1027   guaiFENesin   (MUCINEX ) 12 hr tablet 1,200 mg, 1,200 mg, Oral, BID, Joseph [name], Joseph Latif, DO, 1,200 mg at 06/02/24 1027   HYDROmorphone  (DILAUDID ) injection 0.5-1 mg, 0.5-1 mg, Intravenous, Q4H PRN, Claudene Gauze M, NP, 1 mg at 06/02/24 9170   iron  polysaccharides (NIFEREX) capsule 150 mg, 150 mg, Oral, Daily, Joseph [name], Joseph Latif, DO, 150 mg at 06/02/24 1027   lidocaine  (LIDODERM ) 5 % 1 patch, 1 patch, Transdermal, Daily, Sherrill Cable Peterman, DO, 1 patch at 05/31/24 9188   methadone  (  DOLOPHINE ) tablet 10 mg, 10 mg, Oral, Q8H, Smith, [employer], NP, 10 mg at 06/02/24 0559   methocarbamol  (ROBAXIN ) tablet 750 mg, 750 mg, Oral, TID AC & HS, Joseph [name], Joseph S, MD, 750 mg at 06/02/24 9170   multivitamin with minerals tablet 1 tablet, 1 tablet, Oral, Daily, Joseph [name], Joseph Latif, DO, 1 tablet at 06/02/24 1026   ondansetron  (ZOFRAN ) tablet 4 mg, 4 mg, Oral, Q6H PRN **OR** ondansetron  (ZOFRAN ) injection 4 mg, 4 mg, Intravenous, Q6H PRN, Porterfield, Amber, PA-C   oxyCODONE  (Oxy IR/ROXICODONE ) immediate release tablet 30 mg, 30 mg, Oral, Q3H PRN, Claudene Gauze M, NP, 30 mg at 06/02/24 0146   polyethylene glycol (MIRALAX  / GLYCOLAX ) packet 17 g, 17 g, Oral, Daily, Briana Elgin LABOR, MD, 17 g at 06/02/24 1027   pregabalin  (LYRICA ) capsule 50 mg, 50 mg, Oral, TID, Joseph [name], Joseph S, MD, 50 mg at 06/02/24 1027   protein supplement (ENSURE MAX) liquid, 11 oz, Oral, BID, Joseph [name], Joseph Latif, DO, 11 oz at 06/02/24 1028   senna-docusate (Senokot-[name]) tablet 2 tablet, 2 tablet, Oral, BID, Joseph [name], Joseph S, MD, 2 tablet at 06/02/24 1026   sodium phosphate  (FLEET) enema 1 enema, 1 enema, Rectal, Once PRN, Joseph [name], Joseph S, MD      Patients Current Diet:  Diet Order                  Diet regular Room service appropriate? Yes; Fluid consistency: Thin  Diet effective now                         Precautions / Restrictions Precautions Precautions: Fall, Back, Shoulder Type of Shoulder Precautions: elbow/wrist/hand ROM only, WBAT for  transfers per op note Precaution Booklet Issued: Yes (comment) Precaution/Restrictions Comments: painful R shoulder, present prior to admission but pt states worse now Restrictions Weight Bearing Restrictions Per Provider Order: Yes RUE Weight Bearing Per Provider Order: Non weight bearing RLE Weight Bearing Per Provider Order: Weight bearing as tolerated LLE Weight Bearing Per Provider Order: Weight bearing as tolerated Other Position/Activity Restrictions: Per Dr. Dozier We will allow weightbearing to tolerance given his lower extremity paraplegia and need for his upper extremities    Has the patient had 2 or more falls or a fall with injury in the past year? Yes   Prior Activity Level Community (5-7x/wk): independent without DME prior to admit, driving   Prior Functional Level Self Care: Did the patient need help bathing, dressing, using the toilet or eating? Independent   Indoor Mobility: Did the patient need assistance with walking from room to room (with or without device)? Independent   Stairs: Did the patient need assistance with internal or external stairs (with or without device)? Independent   Functional Cognition: Did the patient need help planning regular tasks such as shopping or remembering to take medications? Independent   Patient Information Are you of Hispanic, Latino/a,or Spanish origin?: A. No, not of Hispanic, Latino/a, or Spanish origin What is your race?: B. Black or African American Do you need or want an interpreter to communicate with a doctor or health care staff?: 0. No   Patient'[name] Response To:  Health Literacy and Transportation Is the patient able to respond to health literacy and transportation needs?: Yes Health Literacy - How often do you need to have someone help you when you read instructions, pamphlets, or other written material from your doctor or pharmacy?: Rarely In the past 12 months, has lack of transportation kept you  from medical  appointments or from getting medications?: No In the past 12 months, has lack of transportation kept you from meetings, work, or from getting things needed for daily living?: No   Journalist, Newspaper / Equipment Home Equipment: [place] - single point, Information systems manager, BSC/3in1   Prior Device Use: Indicate devices/aids used by the patient prior to current illness, exacerbation or injury? None of the above   Current Functional Level Cognition   Orientation Level: Oriented X4    Extremity Assessment (includes Sensation/Coordination)   Upper Extremity Assessment: Right hand dominant, RUE deficits/detail RUE Deficits / Details: elbow/wrist/hand ROM WFL. RUE Coordination: decreased gross motor  Lower Extremity Assessment: Defer to PT evaluation RLE Sensation: decreased light touch LLE Sensation: decreased light touch     ADLs   Overall ADL'[name] : Needs assistance/impaired Eating/Feeding: Set up, Sitting Eating/Feeding Details (indicate cue type and reason): for cutting up food Grooming: Oral care, Wash/dry face, Set up, Sitting Grooming Details (indicate cue type and reason): Setup to wash face and ears, Mod A to don deodorant - assist to don under L arm. Pt able to use LUE to don under R arm Upper Body Bathing: Moderate assistance, Sitting Upper Body Bathing Details (indicate cue type and reason): Mod A for posterior bathing Lower Body Bathing: Maximal assistance, Sit to/from stand, Sitting/lateral leans Lower Body Bathing Details (indicate cue type and reason): assist to bathe bottom in standing Upper Body Dressing : Maximal assistance Upper Body Dressing Details (indicate cue type and reason): Max A to doff UB dressing Lower Body Dressing: Maximal assistance, Bed level Lower Body Dressing Details (indicate cue type and reason): max A don socks bed level Toilet Transfer: Moderate assistance, +2 for physical assistance, +2 for safety/equipment, Stand-pivot (R platform RW) Toilet Transfer  Details (indicate cue type and reason): STS with stedy this session Toileting- Clothing Manipulation and Hygiene: Maximal assistance Toileting - Clothing Manipulation Details (indicate cue type and reason): Pt used bedside urinal while supine     Mobility   Overal bed mobility: Needs Assistance Bed Mobility: Sit to Sidelying, Rolling Rolling: Supervision Sidelying to sit: Supervision Supine to sit: Supervision, HOB elevated, Used rails Sit to supine: Mod assist, Used rails Sit to sidelying: Min assist, Used rails General bed mobility comments: Assist for BLE over edge of bed, cues for log roll, pt using rail PRN to return to supine     Transfers   Overall transfer level: Needs assistance Equipment used: Right platform walker Transfers: Sit to/from Stand, Bed to chair/wheelchair/BSC Sit to Stand: Min assist, From elevated surface, +2 safety/equipment Bed to/from chair/wheelchair/BSC transfer type:: Step pivot Step pivot transfers: Min assist, +2 safety/equipment Transfer via Lift Equipment: Stedy General transfer comment: chair>RW and RW> EOB via step pivot toward his R, pt able to maintain standing a couple mins while bed linens changed by NT.     Ambulation / Gait / Stairs / Wheelchair Mobility   Ambulation/Gait Ambulation/Gait assistance: Contact guard assist, +2 safety/equipment Gait Distance (Feet): 40 Feet Assistive device: Right platform walker Gait Pattern/deviations: Step-through pattern, Decreased stride length, Narrow base of support General Gait Details: pt defers due to pain/fatigue, and visitors arriving to his room with his dinner while pt returned to bed Gait velocity: decr     Posture / Balance Dynamic Sitting Balance Sitting balance - Comments: Sit EOB with supervision Balance Overall balance assessment: History of Falls, Needs assistance Sitting-balance support: Feet supported, No upper extremity supported Sitting balance-Leahy Scale: Good Sitting balance -  Comments:  Sit EOB with supervision Standing balance support: Bilateral upper extremity supported, During functional activity, Reliant on assistive device for balance Standing balance-Leahy Scale: Poor Standing balance comment: Dependent on platform walker and external support     Special considerations/life events  Skin surgical incision to back and RUE and Special service needs radiation treatments starting on 12/11 and to occur every weekday through 12/24, outpatient f/u with oncology for systemic treatment plan    Previous Home Environment (from acute therapy documentation) Living Arrangements: Spouse/significant other Available Help at Discharge: Family, Available 24 hours/day Type of Home: House Home Layout: One level Home Access: Stairs to enter Entrance Stairs-Rails: None Entrance Stairs-Number of Steps: 2 Bathroom Shower/Tub: Health Visitor: Handicapped height Home Care Services: No   Discharge Living Setting Plans for Discharge Living Setting: Lives with (comment) (aunt) Type of Home at Discharge: House Discharge Home Layout: One level Discharge Home Access: Stairs to enter Entrance Stairs-Rails: None Entrance Stairs-Number of Steps: 1 Discharge Bathroom Shower/Tub: Walk-in shower Discharge Bathroom Toilet: Standard Discharge Bathroom Accessibility: Yes How Accessible: Accessible via walker Does the patient have any problems obtaining your medications?: No   Social/Family/Support Systems Anticipated Caregiver: Patient will stay with aunt Lyndell Cook) who can provide supervision, other family available PRN to assist. Anticipated Caregiver'[name] Contact Information: [place] [phone] Ability/Limitations of Caregiver: pt'[name] aunt undergoing chemo per chart review Caregiver Availability: 24/7 Discharge Plan Discussed with Primary Caregiver: Yes Is Caregiver In Agreement with Plan?: Yes Does Caregiver/Family have Issues with Lodging/Transportation while Pt is in  Rehab?: No   Goals Patient/Family Goal for Rehab: PT/OT supervision to mod I ambulatory level with RW, SLP n/a Expected length of stay: 10-14 days Additional Information: Discharge plan: will d/c to aunt'[name] home where he will have supervision from her and other family up to 24/7 if needed. Pt/Family Agrees to Admission and willing to participate: Yes Program Orientation Provided & Reviewed with Pt/Caregiver Including Roles  & Responsibilities: Yes   Decrease burden of Care through IP rehab admission: n/a   Possible need for SNF placement upon discharge:  No.    Patient Condition: I have reviewed medical records from [facility], spoken with [facility] team, and patient and family member. I met with patient at the bedside for inpatient rehabilitation assessment.  Patient will benefit from ongoing PT and OT, can actively participate in 3 hours of therapy a day 5 days of the week, and can make measurable gains during the admission.  Patient will also benefit from the coordinated team approach during an Inpatient Acute Rehabilitation admission.  The patient will receive intensive therapy as well as Rehabilitation physician, nursing, social worker, and care management interventions.  Due to bladder management, bowel management, safety, skin/wound care, disease management, medication administration, pain management, and patient education the patient requires 24 hour a day rehabilitation nursing.  The patient is currently min assist with mobility and basic ADLs.  Discharge setting and therapy post discharge at home with outpatient is anticipated.  Patient has agreed to participate in the Acute Inpatient Rehabilitation Program and will admit today.   Preadmission Screen Completed By:  Reche FORBES Lowers, 06/02/2024 10:54 AM ______________________________________________________________________   Discussed status with Dr. Lovorn on 06/02/2024 at 10:54 AM and received approval for admission today.   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 Yes Co-Morbidities requiring supervision/potential complications: Thyroid  ca with mets to spine and lungs; PE 12/6- on Eliquis ;  goal for thyroiectomy and RAI; radiation starting 12/11 to spine- and 12/18 to humerus; hx of polysubstance use d/o Due to bladder management, bowel management, safety, skin/wound care, disease management, medication administration, pain management, and patient education, does the patient require 24 hr/day rehab nursing? Yes Does the patient require coordinated care of a physician, rehab nurse, PT, OT, to address physical and functional deficits in the context of the above medical diagnosis(es)? Yes Addressing deficits in the following areas: balance, endurance, locomotion, strength, transferring, bowel/bladder control, bathing, dressing, feeding, grooming, and toileting Can the patient actively participate in an intensive therapy program of at least 3 hrs of therapy 5 days a week? Yes The potential for patient to make measurable gains while on inpatient rehab is good Anticipated functional outcomes upon discharge from inpatient rehab: modified independent and supervision PT, modified independent and supervision OT, n/a SLP Estimated rehab length of stay to reach the above functional goals is: 10-14 days Anticipated discharge destination: Home 10. Overall Rehab/Functional Prognosis: good     MD Signature:           Revision History  [name]/Time User Provider Type Action  06/02/2024 12:19 PM Joseph Bouchard, MD Physician Sign  06/02/2024 10:54 AM Butler Reche BRAVO, PT Rehab Admission Coordinator Share  05/31/2024  10:44 AM Butler Reche BRAVO, PT Rehab Admission Coordinator Share   View Details Report

## 2024-06-03 ENCOUNTER — Ambulatory Visit: Payer: MEDICAID

## 2024-06-03 ENCOUNTER — Other Ambulatory Visit: Payer: Self-pay

## 2024-06-03 DIAGNOSIS — Z510 Encounter for antineoplastic radiation therapy: Secondary | ICD-10-CM | POA: Diagnosis not present

## 2024-06-03 LAB — CBC WITH DIFFERENTIAL/PLATELET
Abs Immature Granulocytes: 0.31 K/uL — ABNORMAL HIGH (ref 0.00–0.07)
Basophils Absolute: 0 K/uL (ref 0.0–0.1)
Basophils Relative: 0 %
Eosinophils Absolute: 0.2 K/uL (ref 0.0–0.5)
Eosinophils Relative: 3 %
HCT: 30.9 % — ABNORMAL LOW (ref 39.0–52.0)
Hemoglobin: 9.7 g/dL — ABNORMAL LOW (ref 13.0–17.0)
Immature Granulocytes: 4 %
Lymphocytes Relative: 20 %
Lymphs Abs: 1.5 K/uL (ref 0.7–4.0)
MCH: 29.2 pg (ref 26.0–34.0)
MCHC: 31.4 g/dL (ref 30.0–36.0)
MCV: 93.1 fL (ref 80.0–100.0)
Monocytes Absolute: 0.9 K/uL (ref 0.1–1.0)
Monocytes Relative: 12 %
Neutro Abs: 4.7 K/uL (ref 1.7–7.7)
Neutrophils Relative %: 61 %
Platelets: UNDETERMINED K/uL (ref 150–400)
RBC: 3.32 MIL/uL — ABNORMAL LOW (ref 4.22–5.81)
RDW: 13.7 % (ref 11.5–15.5)
WBC: 7.7 K/uL (ref 4.0–10.5)
nRBC: 0 % (ref 0.0–0.2)

## 2024-06-03 LAB — RAD ONC ARIA SESSION SUMMARY
Course Elapsed Days: 1
Plan Fractions Treated to Date: 2
Plan Prescribed Dose Per Fraction: 3 Gy
Plan Total Fractions Prescribed: 10
Plan Total Prescribed Dose: 30 Gy
Reference Point Dosage Given to Date: 6 Gy
Reference Point Session Dosage Given: 3 Gy
Session Number: 2

## 2024-06-03 LAB — COMPREHENSIVE METABOLIC PANEL WITH GFR
ALT: 44 U/L (ref 0–44)
AST: 23 U/L (ref 15–41)
Albumin: 2.3 g/dL — ABNORMAL LOW (ref 3.5–5.0)
Alkaline Phosphatase: 98 U/L (ref 38–126)
Anion gap: 4 — ABNORMAL LOW (ref 5–15)
BUN: 11 mg/dL (ref 6–20)
CO2: 29 mmol/L (ref 22–32)
Calcium: 8.6 mg/dL — ABNORMAL LOW (ref 8.9–10.3)
Chloride: 102 mmol/L (ref 98–111)
Creatinine, Ser: 0.63 mg/dL (ref 0.61–1.24)
GFR, Estimated: >60 mL/min (ref >60)
Glucose, Bld: 108 mg/dL — ABNORMAL HIGH (ref 70–99)
Potassium: 4 mmol/L (ref 3.5–5.1)
Sodium: 135 mmol/L (ref 135–145)
Total Bilirubin: 0.4 mg/dL (ref 0.0–1.2)
Total Protein: 6.2 g/dL — ABNORMAL LOW (ref 6.5–8.1)

## 2024-06-03 MED ORDER — LIDOCAINE HCL URETHRAL/MUCOSAL 2 % EX GEL: 1.0000 | CUTANEOUS | Status: DC | PRN

## 2024-06-03 NOTE — Progress Notes (Signed)
 Inpatient Rehabilitation Admission Medication Review by a Pharmacist  A complete drug regimen review was completed for this patient to identify any potential clinically significant medication issues.  High Risk Drug Classes Is patient taking? Indication by Medication  Antipsychotic No   Anticoagulant Yes Apixaban  - PE  Antibiotic No   Opioid Yes Methadone  - pain Oxycodone  - pain  Antiplatelet No   Hypoglycemics/insulin No   Vasoactive Medication No   Chemotherapy No   Other Yes APAP - pain Albuterol  - prn shortness of breath Maalox - prn indigestion Bisacodyl  - bowel regimen Flexeril  - prn muscle spasm Diphenhydramine  - prn itching Guaifenesin  - congestion Iron  - anemia, supplement Lidocaine  patch - pain Methocarbamol  - muscle spasm MVI - supplement Zofran  - pen nausea PEG - bowel regimen Pregabalin  - pain SennaS - bowel regimen Fleets enema -bowel regimen Trazodone  - sleep     Type of Medication Issue Identified Description of Issue Recommendation(s)  Drug Interaction(s) (clinically significant)     Duplicate Therapy     Allergy     No Medication Administration End Date     Incorrect Dose     Additional Drug Therapy Needed     Significant med changes from prior encounter (inform family/care partners about these prior to discharge).    Other       Clinically significant medication issues were identified that warrant physician communication and completion of prescribed/recommended actions by midnight of the next day:  No  Name of provider notified for urgent issues identified:   Provider Method of Notification:     Pharmacist comments: Patient on duplicate muscle relaxants and pain medications, however followed by [facility] for pain management.  Time spent performing this drug regimen review (minutes):  30   Kael Keetch, [place] 06/03/2024 8:40 AM

## 2024-06-03 NOTE — Discharge Instructions (Addendum)
 Inpatient Rehab Discharge Instructions  Joseph [name]  Discharge date and time: 06/21/2024  Activities/Precautions/ Functional Status:  Activity: no lifting, driving, or strenuous exercise for until cleared by provider.   Diet: regular diet  Wound Care: none needed   Functional status:  ___ No restrictions     ___ Walk up steps independently ___ 24/7 supervision/assistance   ___ Walk up steps with assistance _X__ Intermittent supervision/assistance  ___ Bathe/dress independently ___ Walk with walker     ___ Bathe/dress with assistance ___ Walk Independently    ___ Shower independently ___ Walk with assistance    ___ Shower with assistance __X_ No alcohol     ___ Return to work/school ________  Special Instructions:  Follow up with [name] located in the cancer center for ongoing management.   For lower extremity edema: Apply compression to lower legs and elevate.  My questions have been answered and I understand these instructions. I will adhere to these goals and the provided educational materials after my discharge from the hospital.  Patient/Caregiver Signature _______________________________ Date __________  Clinician Signature _______________________________________ Date __________  Please bring this form and your medication list with you to all your follow-up doctor's appointments.     COMMUNITY REFERRALS UPON DISCHARGE:    Outpatient:   PT      OT              Agency: [facility]   Phone: [phone]              Appointment Date/Time:*Please expect follow-up within 7-10 business days to schedule your appointment. If you have not received follow-up, be sure to contact the site directly.*   Medical Equipment/Items Ordered: heavy duty wheelchair                                                  Agency/Supplier:[facility] [phone]  GENERAL COMMUNITY RESOURCES FOR PATIENT/FAMILY:  1) If you require transportation to medical  appointments, be sure to contact [facility] [phone] atlesat 3-5 business days in advance to schedule.   2) Case Management services for your [place] is [facility] [facility] [phone].   3) If you have any questions/concerns about your insurance plan, call customer service at [phone].      Information on my medicine - ELIQUIS  (apixaban )  This medication education was reviewed with me or my healthcare representative as part of my discharge preparation.    Why was Eliquis  prescribed for you? Eliquis  was prescribed to treat blood clots that may have been found in the veins of your legs (deep vein thrombosis) or in your lungs (pulmonary embolism) and to reduce the risk of them occurring again.  What do You need to know about Eliquis  ? The dose is ONE 5 mg tablet taken TWICE daily.  Eliquis  may be taken with or without food.   Try to take the dose about the same time in the morning and in the evening. If you have difficulty swallowing the tablet whole please discuss with your pharmacist how to take the medication safely.  Take Eliquis  exactly as prescribed and DO NOT stop taking Eliquis  without talking to the doctor who prescribed the medication.  Stopping may increase your risk of developing a new blood clot.  Refill your prescription before you run out.  After discharge, you should have regular check-up  appointments with your healthcare provider that is prescribing your Eliquis .    What do you do if you miss a dose? If a dose of ELIQUIS  is not taken at the scheduled time, take it as soon as possible on the same day and twice-daily administration should be resumed. The dose should not be doubled to make up for a missed dose.  Important Safety Information A possible side effect of Eliquis  is bleeding. You should call your healthcare provider right away if you experience any of the following: Bleeding from an injury or your nose that does not stop. Unusual colored  urine (red or dark brown) or unusual colored stools (red or black). Unusual bruising for unknown reasons. A serious fall or if you hit your head (even if there is no bleeding).  Some medicines may interact with Eliquis  and might increase your risk of bleeding or clotting while on Eliquis . To help avoid this, consult your healthcare provider or pharmacist prior to using any new prescription or non-prescription medications, including herbals, vitamins, non-steroidal anti-inflammatory drugs (NSAIDs) and supplements.  This website has more information on Eliquis  (apixaban ): http://[email or web address] .com/eliquis dena

## 2024-06-03 NOTE — Plan of Care (Signed)
°  Problem: RH Balance Goal: LTG Patient will maintain dynamic standing balance (PT) Description: LTG:  Patient will maintain dynamic standing balance with assistance during mobility activities (PT) Flowsheets (Taken 06/03/2024 1705) LTG: Pt will maintain dynamic standing balance during mobility activities with:: Independent with assistive device    Problem: Sit to Stand Goal: LTG:  Patient will perform sit to stand with assistance level (PT) Description: LTG:  Patient will perform sit to stand with assistance level (PT) Flowsheets (Taken 06/03/2024 1705) LTG: PT will perform sit to stand in preparation for functional mobility with assistance level: Independent with assistive device   Problem: RH Bed Mobility Goal: LTG Patient will perform bed mobility with assist (PT) Description: LTG: Patient will perform bed mobility with assistance, with/without cues (PT). Flowsheets (Taken 06/03/2024 1705) LTG: Pt will perform bed mobility with assistance level of: Independent with assistive device    Problem: RH Bed to Chair Transfers Goal: LTG Patient will perform bed/chair transfers w/assist (PT) Description: LTG: Patient will perform bed to chair transfers with assistance (PT). Flowsheets (Taken 06/03/2024 1705) LTG: Pt will perform Bed to Chair Transfers with assistance level: Independent with assistive device    Problem: RH Car Transfers Goal: LTG Patient will perform car transfers with assist (PT) Description: LTG: Patient will perform car transfers with assistance (PT). Flowsheets (Taken 06/03/2024 1705) LTG: Pt will perform car transfers with assist:: Supervision/Verbal cueing   Problem: RH Ambulation Goal: LTG Patient will ambulate in controlled environment (PT) Description: LTG: Patient will ambulate in a controlled environment, # of feet with assistance (PT). Flowsheets (Taken 06/03/2024 1705) LTG: Pt will ambulate in controlled environ  assist needed:: Supervision/Verbal  cueing LTG: Ambulation distance in controlled environment: 120 ft using LRAD Goal: LTG Patient will ambulate in home environment (PT) Description: LTG: Patient will ambulate in home environment, # of feet with assistance (PT). Flowsheets (Taken 06/03/2024 1705) LTG: Pt will ambulate in home environ  assist needed:: Supervision/Verbal cueing LTG: Ambulation distance in home environment: 50 ft using LRAD   Problem: RH Stairs Goal: LTG Patient will ambulate up and down stairs w/assist (PT) Description: LTG: Patient will ambulate up and down # of stairs with assistance (PT) Flowsheets (Taken 06/03/2024 1705) LTG: Pt will ambulate up/down stairs assist needed:: Contact Guard/Touching assist LTG: Pt will  ambulate up and down number of stairs: with LRAD per home set up

## 2024-06-03 NOTE — Progress Notes (Signed)
 Inpatient Rehabilitation Care Coordinator Assessment and Plan Patient Details  Name: Joseph [name] MRN: [id number] Date of Birth: 1980/12/17  Today's Date: 06/03/2024  Hospital Problems: Principal Problem:   Acute incomplete paraplegia [facility]) Active Problems:   Spinal cord injury at T1-T6 level [facility])  Past Medical History:  Past Medical History:  Diagnosis Date   Anxiety    Asthma    as a child   Depression    Narcotic abuse (HCC)    Pulmonary nodules 04/29/2024   Past Surgical History:  Past Surgical History:  Procedure Laterality Date   CLOSED REDUCTION FINGER WITH PERCUTANEOUS PINNING Right 06/22/2013   Procedure: RIGHT HAND SMALL FINGER CLOSED MANIPULATION AND PINNING;  Surgeon: Prentice LELON Pagan, MD;  Location: [facility] OR;  Service: Orthopedics;  Laterality: Right;   HUMERUS IM NAIL Right 05/21/2024   Procedure: INSERTION, INTRAMEDULLARY ROD, HUMERUS;  Surgeon: Dozier Soulier, MD;  Location: [facility] OR;  Service: Orthopedics;  Laterality: Right;   I & D EXTREMITY  03/24/2012   Procedure: IRRIGATION AND DEBRIDEMENT EXTREMITY;  Surgeon: Oneil JAYSON Herald, MD;  Location: [facility] OR;  Service: Orthopedics;  Laterality: Right;  Right Forearm   POSTERIOR CERVICAL FUSION/FORAMINOTOMY N/A 05/05/2024   Procedure: POSTERIOR CERVICAL FUSION CERVICAL SEVEN-THORACIC ONE, THORACIC ONE-THORACIC TWO, THORACIC TWO-THORACIC THREE ,FOR RESECTION OF TUMOR THORACIC TWO CORPECTOMY;  Surgeon: Darnella Dorn SAUNDERS, MD;  Location: [facility] OR;  Service: Neurosurgery;  Laterality: N/A;   Social History:  reports that he has been smoking cigarettes. He has a 10 pack-year smoking history. He has never used smokeless tobacco. He reports that he does not currently use alcohol. He reports current drug use. Drug: Marijuana.  Family / Support Systems Marital Status: Divorced How Long?: 10 years Patient Roles: Parent Spouse/Significant Other: Divorced Children: 55 yr old; and one child on the way Other Supports: mother, father,  and various family members Anticipated Caregiver: TBD Ability/Limitations of Caregiver: Pt will discharge to his aunt's home who will allow him to remain in her home. Per pt mother, aunt recently diagnosed with breast cancer and unabe to provide any assitnace since she will be going through treatment. Pt mother reports tha everyone works and pt will need assistance when he goes home. 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 Last worked 3.5 weeks ago Education: high school Primary School Teacher - How often do you need to have someone help you when you read instructions, pamphlets, or other written material from your doctor or pharmacy?: Never Writes: Yes Employment Status: Unemployed Marine Scientist Issues: Pt reports an incident 5 years ago in which he served 30 days but no other issues since. Guardian/Conservator: No HCPOA. Would like to complete while here. Informed his assigned RN.    Abuse/Neglect Abuse/Neglect Assessment Can Be Completed: Yes Physical Abuse: Denies Verbal Abuse: Denies Sexual Abuse: Denies Exploitation of patient/patient's resources: Denies Self-Neglect: Denies   Patient response to: Social Isolation - How often do you feel lonely or isolated from those around you?: Rarely   Emotional Status Pt's affect, behavior and adjustment status: Pt in good spirits at time of visit Recent Psychosocial Issues: Denies Psychiatric History: Pt reports a hx of dperession and anxiety. No meds; has taken Zoloft  in the past; counseling services in the past as well. Substance Abuse History: Pt admits to smoking atleast 10 cigarettes  per day and unsure if he would like to quit. Pt reports he quit drinking 7-8 yrs ago.  Admits to rec drug use fentanyl  and THC  daily. Reports ready to quit.   Patient / Family Perceptions, Expectations & Goals Pt/Family understanding of illness & functional limitations: Pt and family have a general understanding of care needs Premorbid pt/family roles/activities: Independent Anticipated changes in roles/activities/participation: Assistance with ADLs/IADLs Pt/family expectations/goals: Pt goal is to work on staying in a positive state of mind; would like to be independent where he can get up and go to bathroom on his own, walking etc.   [employer]: None Premorbid Home Care/DME Agencies: None Transportation available at discharge: TBD Is the patient able to respond to transportation needs?: Yes In the past 12 months, has lack of transportation kept you from medical appointments or from getting medications?: No In the past 12 months, has lack of transportation kept you from meetings, work, or from getting things needed for daily living?: No Resource referrals recommended: Neuropsychology   Discharge Planning Living Arrangements: Other relatives Support Systems: Other relatives, Parent Type of Residence: Private residence Insurance Resources: Media Planner (specify) Administrator Tailored PLan) Financial Resources: Employment Surveyor, Quantity Screen Referred: No Money Management: Family Does the patient have any problems obtaining your medications?: No Home Management: N/A Patient/Family Preliminary Plans: TBD Care Coordinator Barriers to Discharge: Decreased caregiver support, Lack of/limited family support, Insurance for SNF coverage Care Coordinator Anticipated Follow Up Needs: HH/OP Expected length of stay: 2-3 weeks   Clinical Impression SW familiar with patient from previous admission.   1154- SW left message for pt father Toney to request follow-up to discuss discharge plan at discharge. SW waiting on follow-up.  *SW received return phone call from patient father to discuss contact.  Reports that contact person will be his mother.   1158- SW spoke with pt mother [name] to follow-up. She shares that there are limited supports when he goes home. His aunt will be at the home but continues to undergo chemo treatment. She is able to provide supervision at times but not home all the time. When asked if his aunt could atleast bring him food if he was unable to get up to make a plate of food. She states sometimes if she is there, but they would have someone bring him something to eat if needed. SW reiterated limitations with insurance plan. SW will submit PCS referral but they should plan to have support in place until this service is established. She states she is aware of this. He will need to be Mod I at time of discharge   [name] 06/03/2024, 11:41 AM

## 2024-06-03 NOTE — Progress Notes (Signed)
 PROGRESS NOTE   Subjective/Complaints:  Pt reports slept really well- but caused pain to get worse, since didn't take pain meds overnight.  But overall it's about the same.  LBM yesterday.   No other issues so far this AM- getitng pain meds before therapy starts currently.   ROS: Per HPI  Pt denies SOB, abd pain, CP, N/V/C/D, and vision changes   Objective:   No results found. Recent Labs    06/02/24 0404 06/03/24 0537  WBC 7.0 7.7  HGB 9.9* 9.7*  HCT 32.5* 30.9*  PLT 94* PLATELET CLUMPS NOTED ON SMEAR, UNABLE TO ESTIMATE   Recent Labs    06/02/24 0404 06/03/24 0537  NA 135 135  K 3.9 4.0  CL 101 102  CO2 26 29  GLUCOSE 123* 108*  BUN 11 11  CREATININE 0.60* 0.63  CALCIUM 8.3* 8.6*    Intake/Output Summary (Last 24 hours) at 06/03/2024 0914 Last data filed at 06/03/2024 9277 Gross per 24 hour  Intake 240 ml  Output 900 ml  Net -660 ml        Physical Exam: Vital Signs Blood pressure 124/77, pulse 99, temperature 98.7 F (37.1 C), temperature source Oral, resp. rate 14, height 5' 9 (1.753 m), weight (!) 148.7 kg, SpO2 95%.   General: awake, alert, appropriate, sitting up slightly in bed; nursing in room; NAD HENT: conjugate gaze; oropharynx moist CV: regular rate and rhythm- but rate in 90's when I assessed; no JVD Pulmonary: CTA B/L; no W/R/R- good air movement GI: soft, NT, ND, (+)BS- protuberant Psychiatric: appropriate- but slightly flat Neurological: Ox3 Musculoskeletal:     Cervical back: Neck supple.     Comments: LUE 5/5 RUE- at least 3/5 proximally; and 5-/5 distally- limited by pain and surgery of proximal humerus RLE- HF 3-/5; KE/KF 4+/5; DF 4/5 and PF 3-/5 LLE- HF 3+/5; KE/KF 5-/5 and DF/PF 5-/5  Skin:    General: Skin is warm and dry.     Comments: Bandage seen on R proximal arm- c/d/I Didn't assess thoracic incision- due to pt's sleepiness-didn't turn over  Neurological:      Comments: 5-6 beats clonus B/L No hoffman's MAS of 1 in LE's- much better Intact to light touch in all 4 extremities   Assessment/Plan: 1. Functional deficits which require 3+ hours per day of interdisciplinary therapy in a comprehensive inpatient rehab setting. Physiatrist is providing close team supervision and 24 hour management of active medical problems listed below. Physiatrist and rehab team continue to assess barriers to discharge/monitor patient progress toward functional and medical goals  Care Tool:  Bathing              Bathing assist       Upper Body Dressing/Undressing Upper body dressing        Upper body assist      Lower Body Dressing/Undressing Lower body dressing            Lower body assist       Toileting Toileting    Toileting assist       Transfers Chair/bed transfer  Transfers assist           Locomotion Ambulation  Ambulation assist              Walk 10 feet activity   Assist           Walk 50 feet activity   Assist           Walk 150 feet activity   Assist           Walk 10 feet on uneven surface  activity   Assist           Wheelchair     Assist               Wheelchair 50 feet with 2 turns activity    Assist            Wheelchair 150 feet activity     Assist          Blood pressure 124/77, pulse 99, temperature 98.7 F (37.1 C), temperature source Oral, resp. rate 14, height 5' 9 (1.753 m), weight (!) 148.7 kg, SpO2 95%.   Medical Problem List and Plan: 1. 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              -patient may  shower             -ELOS/Goals: ~ 14 days- supervision             Admit to CIR             Appropriate for CIR- didn't finish first time due to R humeral pathological fx due to Thyroid  cancer mets  First day of evaluations- con't CIR PT and  OT 2.  Antithrombotics: -DVT/anticoagulation:  Mechanical: Sequential compression devices, below knee Bilateral lower extremities Pharmaceutical: Eliquis              -antiplatelet therapy: N/A   3. Pain Management: Robaxin  750 TID AC/HS, Lyrica  50 mg TID, Methadone  10 mg TID. Oxycodone  30 mg q3 hours as needed for breakthrough pain. Palliative care to follow.    12/12- slept so well, pain worse this AM- currently getting pain meds- won't increase- please don't increase over weekend 4. Mood/Behavior/Sleep: LCSW to follow for evaluation and support when available.              -antipsychotic agents: n/a   5. Neuropsych/cognition: This patient is capable of making decisions on his own behalf.   6. Skin/Wound Care: Routine pressure relief measures. Monitor incisions   7. Fluids/Electrolytes/Nutrition: Monitor I&O and weight. Follow up labs CBC/CMP     8. 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 Completed course of dexamethasone . Patient to follow up outpatient with ENT, endocrinology and radiation oncology. ENT plan to discuss at multidisciplinary tumor board on 06/08/24.              Will likely need Thyroidectomy and RAI-waiting for tumor board 9. Pathological right proximal humerus fracture: s/p IMR 11/29. WBAT in RUE due to needing that- use platform walker.  Ortho follow up in 2 weeks for recheck and updated xrays.  -Patient to remain in sling.   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 Patient underwent instrumentation and fusion on prior admission. Radiation Onc plans 2 weeks of radiotherapy to thoracic spine starts 06/02/24- right humerus on 06/09/24.   11. Hx of PE: dx on 11/6 provoked secondary to metastatic cancer. Continue Eliquis  for 3-6 months.   12. Paraplegia/Neurogenic bladder: Monitor bladder scan with PVR   12/12- will order  in/out caths and bladder scans q6 hours- to see if needs cathing   13. Chronic normocytic anemia: Stable,  continue Niferex 150 mg daily    14. Constipation/mild neurogenic bowel: Continue Miralax  and SennaK   12/12- LBM yesterday  15. Polysubstance use disorder- will have palliative care f/u with meds after d/c for wean     I spent a total of 36   minutes on total care today- >50% coordination of care- due to  Review of chart, labs, vitals and B/B and orders- d/w nursing  LOS: 1 days A FACE TO FACE EVALUATION WAS PERFORMED  Danalee Flath 06/03/2024, 9:14 AM

## 2024-06-03 NOTE — Evaluation (Signed)
 Physical Therapy Assessment and Plan  Patient Details  Name: Joseph [name] MRN: [id number] Date of Birth: 1981/04/04  PT Diagnosis: Abnormal posture, Abnormality of gait, Coordination disorder, Difficulty walking, Muscle weakness, and Pain in R shoulder. Rehab Potential: Good ELOS: 10-12 days   Today's Date: 06/03/2024 PT Individual Time: 9196-9084, [id number] PT Individual Time Calculation (min): 72 min, 58 min    Hospital Problem: Principal Problem:   Acute incomplete paraplegia (HCC) Active Problems:   Spinal cord injury at T1-T6 level [facility])   Past Medical History:  Past Medical History:  Diagnosis Date   Anxiety    Asthma    as a child   Depression    Narcotic abuse (HCC)    Pulmonary nodules 04/29/2024   Past Surgical History:  Past Surgical History:  Procedure Laterality Date   CLOSED REDUCTION FINGER WITH PERCUTANEOUS PINNING Right 06/22/2013   Procedure: RIGHT HAND SMALL FINGER CLOSED MANIPULATION AND PINNING;  Surgeon: Prentice LELON Pagan, MD;  Location: [facility] OR;  Service: Orthopedics;  Laterality: Right;   HUMERUS IM NAIL Right 05/21/2024   Procedure: INSERTION, INTRAMEDULLARY ROD, HUMERUS;  Surgeon: Dozier Soulier, MD;  Location: [facility] OR;  Service: Orthopedics;  Laterality: Right;   I & D EXTREMITY  03/24/2012   Procedure: IRRIGATION AND DEBRIDEMENT EXTREMITY;  Surgeon: Oneil JAYSON Herald, MD;  Location: [facility] OR;  Service: Orthopedics;  Laterality: Right;  Right Forearm   POSTERIOR CERVICAL FUSION/FORAMINOTOMY N/A 05/05/2024   Procedure: POSTERIOR CERVICAL FUSION CERVICAL SEVEN-THORACIC ONE, THORACIC ONE-THORACIC TWO, THORACIC TWO-THORACIC THREE ,FOR RESECTION OF TUMOR THORACIC TWO CORPECTOMY;  Surgeon: Darnella Dorn SAUNDERS, MD;  Location: [facility] OR;  Service: Neurosurgery;  Laterality: N/A;    Assessment & Plan Clinical Impression: Patient is a 43 y.o. year old male with PMH of asthma, anxiety, depression, with history of polysubstance use disorder  who presented to [facility] on 04/27/2024 due to worsening low back pain, rib pain for several weeks. Due to pain, he endorsed difficulty walking and saddle anesthesia. In ED he was hemodynamically stable, A chest xray showed possible LUL nodule and degenerative changes in the lumbar spine. Right should x-ray showed humeral lucency in the proximal humerus, differential considerations included metastatic disease or multiple myeloma. CT scan showed a large Schmorl's node at the inferior L5 endplate. CT angiogram of chest showed right upper lobe segmental pulmonary embolism with incidental findings of right lower lobe nodules, enlarged and heterogeneous left thyroid  lobe measuring up to 5 cm with right tracheal deviation. Nonemergent thyroid  ultrasound recommended. Heparin  was initiated for pulmonary embolism, patient transferred to [facility] for admission.    Due to difficulty with ambulation and LE sensory changes, neurology was consulted and recommended full spine MRI which showed a pathologic fracture at T2 with several spinal canal stenosis and cord compression L > R with subtle thoracic cord signal change suggesting early cord edema. He was transferred to [facility] on 11/9 for neurosurgery consultation and operative intervention after eliquis  wash out. Patient underwent a posterior T1-3 instrumentation and fusion with T1-3 laminectomy, T2 corpectomy for resection of metastatic tumor by Dr. Darnella on 11/13. 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 Oncology was consulted and reccommended outpatient follow up with ENT, radiation oncology, and endocrinology.    Palliative care consulted for goals of care and pain management. A prevena suction vac was placed by WOC on 11/24, discontinued on 11/27. He continues  on Eliquis . On 11/17 he developed a fever where a workup was negative, WBC trended  down. He was admitted to CIR on 11/24 where he was significantly limited by right shoulder pain, initially suspected to be rotator cuff pathology. A MRI completed on 11/29 showed a large tumor with associated pathological fracture of the proximal right humerus. He was admitted back to acute care services. Dr. Dozier performed IMN of R humerus on 11/29. Post op he was WBAT to the RUE. Hospital course complicated by chronic pain with palliative care was consulted for Radiation oncology consulted for palliative radiation to the spine and humerus with plans for 2-week course beginning 12/11 to the spine and 12/18 to the humerus.  He will follow-up with oncology as outpatient for systemic treatment. Palliative care continues to follow of pain/symptom management.    Prior to arrival the patient was independent and driving without use of DME. He currently requires min assist with mobility and basic ADLs.  Therapy evaluations completed due to patient decreased functional mobility was admitted for a comprehensive rehab program.  Patient transferred to CIR on 06/02/2024 .   Patient currently requires mod with mobility secondary to muscle weakness and muscle joint tightness, decreased cardiorespiratoy endurance, decreased coordination, and decreased standing balance, decreased postural control, decreased balance strategies, and difficulty maintaining precautions.  Prior to hospitalization, patient was independent  with mobility and lived with Family (going to aunts house at [place]) in a House home.  Home access is 2Stairs to enter.  Patient will benefit from skilled PT intervention to maximize safe functional mobility, minimize fall risk, and decrease caregiver burden for planned discharge home with intermittent supervision.  Anticipate patient will benefit from follow up HH at discharge.  PT - End of Session Activity Tolerance: Tolerates 10 - 20 min activity with multiple rests Endurance Deficit: Yes Endurance  Deficit Description: limited by pain, fatigues quickly PT Assessment Rehab Potential (ACUTE/IP ONLY): Good PT Barriers to Discharge: Decreased caregiver support;Home environment access/layout;Lack of/limited family support PT Patient demonstrates impairments in the following area(s): Balance;Sensory;Edema;Endurance;Motor;Pain;Safety;Skin Integrity PT Transfers Functional Problem(s): Bed Mobility;Bed to Chair;Car;Furniture PT Locomotion Functional Problem(s): Ambulation;Wheelchair Mobility;Stairs PT Plan PT Intensity: Minimum of 1-2 x/day ,45 to 90 minutes PT Frequency: 5 out of 7 days PT Duration Estimated Length of Stay: 10-12 days PT Treatment/Interventions: Ambulation/gait training;Cognitive remediation/compensation;Discharge planning;DME/adaptive equipment instruction;Functional mobility training;Pain management;Psychosocial support;Splinting/orthotics;Therapeutic Activities;UE/LE Strength taining/ROM;Visual/perceptual remediation/compensation;Balance/vestibular training;Community reintegration;Disease management/prevention;Functional electrical stimulation;Neuromuscular re-education;Patient/family education;Skin care/wound management;Stair training;Therapeutic Exercise;UE/LE Coordination activities;Wheelchair propulsion/positioning PT Transfers Anticipated Outcome(s): supervision/mod I PT Locomotion Anticipated Outcome(s): supervision PT Recommendation Recommendations for Other Services: Neuropsych consult Follow Up Recommendations: Home health PT Patient destination: Home (to aunts house) Equipment Recommended: To be determined   PT Evaluation Precautions/Restrictions Precautions Precautions: Fall;Back;Shoulder Type of Shoulder Precautions: WBAT R UE Shoulder Interventions: Shoulder sling/immobilizer Precaution Booklet Issued: Yes (comment) Recall of Precautions/Restrictions: Intact Required Braces or Orthoses: Sling Restrictions Weight Bearing Restrictions Per Provider Order:  Yes RUE Weight Bearing Per Provider Order: Weight bearing as tolerated (with sling) RLE Weight Bearing Per Provider Order: Weight bearing as tolerated LLE Weight Bearing Per Provider Order: Weight bearing as tolerated Other Position/Activity Restrictions: Per Dr. Dozier We will allow weightbearing to tolerance given his lower extremity paraplegia and need for his upper extremities Pain Interference Pain Interference Pain Effect on Sleep: 3. Frequently Pain Interference with Therapy Activities: 1. Rarely or not at all Pain Interference with Day-to-Day Activities: 1. Rarely or not at all Home Living/Prior Functioning Home Living Available Help at Discharge: Family (Pt's  auth able to provide supervision at most) Type of Home: House Entrance Stairs-Number of Steps: 2 Entrance Stairs-Rails: None Home Layout: One level Bathroom Accessibility: Yes  Lives With: Family (going to aunts house at [place]) Prior Function Level of Independence: Independent with transfers;Independent with gait;Independent with homemaking with ambulation  Able to Take Stairs?: Yes Driving: Yes Vocation: Full time employment Vocation Requirements: delivery work-lifting and carrying Vision/Perception  Vision - History Ability to See in Adequate Light: 0 Adequate Perception Perception: Within Functional Limits Praxis Praxis: WFL  Cognition Overall Cognitive Status: Within Functional Limits for tasks assessed Arousal/Alertness: Awake/alert Orientation Level: Oriented X4 Memory: Appears intact Awareness: Appears intact Problem Solving: Appears intact Safety/Judgment: Appears intact Sensation Sensation Light Touch: Appears Intact Proprioception: Appears Intact Additional Comments: LT intact and symmetrical B LE in all dermatomes Coordination Gross Motor Movements are Fluid and Coordinated: No Motor  Motor Motor: Paraplegia;Other (comment) Motor - Skilled Clinical Observations: generalized muscle weakness    Trunk/Postural Assessment  Cervical Assessment Cervical Assessment: Within Functional Limits Thoracic Assessment Thoracic Assessment: Exceptions to [facility] Lumbar Assessment Lumbar Assessment: Within Functional Limits Postural Control Postural Control: Deficits on evaluation (delayed and inadequate)  Balance Balance Balance Assessed: Yes Static Sitting Balance Static Sitting - Balance Support: Feet supported Static Sitting - Level of Assistance: 5: Stand by assistance Dynamic Sitting Balance Dynamic Sitting - Balance Support: Feet supported Dynamic Sitting - Level of Assistance: 5: Stand by assistance Static Standing Balance Static Standing - Balance Support: Bilateral upper extremity supported Static Standing - Level of Assistance: 4: Min assist Dynamic Standing Balance Dynamic Standing - Balance Support: Bilateral upper extremity supported Dynamic Standing - Level of Assistance: 4: Min assist Extremity Assessment  RLE Assessment RLE Assessment: Exceptions to [facility] General Strength Comments: Grossly 3+ to 4/5, functional weakness noted with mobility LLE Assessment LLE Assessment: Exceptions to [facility] General Strength Comments: Grossly 3+ to 4/5, functional weakness noted with mobility  Care Tool Care Tool Bed Mobility Roll left and right activity   Roll left and right assist level: Minimal Assistance - Patient > 75% (Min toward R due to pain; CGA toward L)    Sit to lying activity   Sit to lying assist level: Contact Guard/Touching assist    Lying to sitting on side of bed activity   Lying to sitting on side of bed assist level: the ability to move from lying on the back to sitting on the side of the bed with no back support.: Moderate Assistance - Patient 50 - 74%     Care Tool Transfers Sit to stand transfer   Sit to stand assist level: Moderate Assistance - Patient 50 - 74%    Chair/bed transfer   Chair/bed transfer assist level: Minimal Assistance - Patient > 75%     Car transfer   Car transfer assist level: Minimal Assistance - Patient > 75%      Care Tool Locomotion Ambulation   Assist level: Contact Guard/Touching assist Assistive device: Walker-rolling Max distance: 40 ft  Walk 10 feet activity   Assist level: Contact Guard/Touching assist Assistive device: Walker-rolling   Walk 50 feet with 2 turns activity Walk 50 feet with 2 turns activity did not occur: Safety/medical concerns (weakness/fatigue/pain)      Walk 150 feet activity Walk 150 feet activity did not occur: Safety/medical concerns (weakness/fatigue/pain)      Walk 10 feet on uneven surfaces activity Walk 10 feet on uneven surfaces activity did not occur: Safety/medical concerns (weakness/fatigue/pain)      Stairs  Assist level: Moderate Assistance - Patient - 50 - 74% Stairs assistive device: 2 hand rails (primarily using L UE) Max number of stairs: 2  Walk up/down 1 step activity   Walk up/down 1 step (curb) assist level: Moderate Assistance - Patient - 50 - 74% Walk up/down 1 step or curb assistive device: 2 hand rails  Walk up/down 4 steps activity Walk up/down 4 steps activity did not occur: Safety/medical concerns (weakness/fatigue/pain)      Walk up/down 12 steps activity Walk up/down 12 steps activity did not occur: Safety/medical concerns (weakness/fatigue/pain)      Pick up small objects from floor Pick up small object from the floor (from standing position) activity did not occur: Safety/medical concerns (weakness/fatigue/pain)      Wheelchair Is the patient using a wheelchair?: Yes Type of Wheelchair: Manual   Wheelchair assist level: Maximal Assistance - Patient 25 - 49% (B LE and L UE) Max wheelchair distance: 10 ft  Wheel 50 feet with 2 turns activity   Assist Level: Dependent - Patient 0%  Wheel 150 feet activity   Assist Level: Dependent - Patient 0%    Refer to Care Plan for Long Term Goals  SHORT TERM GOAL WEEK 1 PT Short Term Goal 1 (Week  1): Pt will perform sit to supine transfers with min A PT Short Term Goal 2 (Week 1): Pt will perform sit <> stand transfers with CGA PT Short Term Goal 3 (Week 1): Pt will perform bed <> WC transfers with CGA  Recommendations for other services: Neuropsych  Session 1: Evaluation completed (see details above) with patient education regarding purpose of PT evaluation, PT POC and goals, therapy schedule, weekly team meetings, and other CIR information including safety plan and fall risk safety. Pt semi-reclined in bed upon arrival. Pt reports 6.5-7/10 pain in R shoulder, premedicated. Pt agreeable to therapy. Pt states his bed at home is flat and therefore PT lowered HOB. Pt required min A for rolling toward R due to shoulder pain whereas rolling L CGA. Pt sat to EOB with CGA and ++ time, however, sit to supine transfer required mod A for B LE and ++ time. Pt trialed R PFRW and RW and stated RW was more comfortable/less painful on R shoulder. Pt stood to RW with min A and ambulated ~25 ft with CGA. Pt required more mod A for sitting in [facility] for controlled descent. Pt transferred back to EOB with min A. Pt required prolonged rest breaks due to fatigue and R shoulder pain. Pt very fatigued at end of session. Pt supine in bed, bed alarm on, and all needs in reach at end of session.  Pt performed the below functional mobility tasks with the specified levels of skilled cuing and assistance.  Skilled Therapeutic Intervention Mobility Bed Mobility Bed Mobility: Rolling Right;Rolling Left;Supine to Sit;Sit to Supine Rolling Right: Minimal Assistance - Patient > 75% (due to R shoulder pain) Rolling Left: Contact Guard/Touching assist Supine to Sit: Contact Guard/Touching assist (use of bed rail and ++ time) Sit to Supine: Moderate Assistance - Patient 50-74% Transfers Transfers: Sit to Stand;Stand to Sit Sit to Stand: Minimal Assistance - Patient > 75% (++ time) Stand to Sit: Moderate Assistance - Patient  50-74%;Minimal Assistance - Patient > 75% (mod for controlled descent) Stand Pivot Transfers: Minimal Assistance - Patient > 75% Stand Pivot Transfer Details: Verbal cues for gait pattern;Verbal cues for safe use of DME/AE;Verbal cues for sequencing;Verbal cues for technique;Verbal cues for precautions/safety Transfer (Assistive device):  Rolling walker Locomotion  Gait Ambulation: Yes Gait Assistance: Contact Guard/Touching assist (++ time) Assistive device: Rolling walker Gait Gait: Yes Gait Pattern: Step-to pattern;Poor foot clearance - right;Poor foot clearance - left;Decreased step length - right;Decreased step length - left;Decreased stride length;Decreased hip/knee flexion - right;Decreased hip/knee flexion - left;Decreased dorsiflexion - right;Decreased dorsiflexion - left;Decreased weight shift to right;Decreased weight shift to left Gait velocity: decreased Stairs / Additional Locomotion Stairs: Yes Stairs Assistance: Moderate Assistance - Patient 50 - 74% Stair Management Technique: Two rails (primarily using L UE) Number of Stairs: 2 Height of Stairs: 6 Wheelchair Mobility Wheelchair Mobility: Yes Wheelchair Assistance: Maximal Assistance - Patient 25 - 49% (for steering) Wheelchair Propulsion: Both lower extermities;Left upper extremity Wheelchair Parts Management: Needs assistance Distance: 10 ft  Session 2: Pt semi-reclined in bed napping upon arrival. Pt reports 6/10 pain in R shoulder, premedicated, and agreeable to therapy. Pt transferred to [facility] using RW with min A. Pt transported dependent in [facility] to ortho gym for time/energy conservation. Pt performed stand pivot transfer WC <> car simulator with min A and ++ time. PT switched out pt's WC for appropriate size and added standard cushion for comfort. Pt reported increased comfort. Pt stood to RW with min A and ambulated ~40 ft with CGA before requesting to sit due to fatigue and pain. Pt attempted to propel manual WC using B  LE and L UE, however, pt required max A for steering and only able to go ~10 ft before stopping due to fatigue. Pt attempted stair negotiation - asc/desc 2-6 inch steps using B HR (L UE>R UE) with heavy mod A. Pt very fatigued afterwards. Pt transported dependent in [facility] back to room for time/energy conservation. Pt returned to supine in bed, bed alarm set, and all needs in reach at end of session.  Discharge Criteria: Patient will be discharged from PT if patient refuses treatment 3 consecutive times without medical reason, if treatment goals not met, if there is a change in medical status, if patient makes no progress towards goals or if patient is discharged from hospital.  The above assessment, treatment plan, treatment alternatives and goals were discussed and mutually agreed upon: by patient  Comer CHRISTELLA Levora Comer Levora, PT, DPT 06/03/2024, 4:52 PM

## 2024-06-03 NOTE — Progress Notes (Signed)
 Occupational Therapy Assessment and Plan  Patient Details  Name: NEIZAN DEBRUHL MRN: [id number] Date of Birth: Oct 05, 1980  OT Diagnosis: acute pain and muscle weakness (generalized) Rehab Potential: Rehab Potential (ACUTE ONLY): Good ELOS: 10-12 days   Today's Date: 06/03/2024 OT Individual Time: 1000-1115 OT Individual Time Calculation (min): 75 min     Hospital Problem: Principal Problem:   Acute incomplete paraplegia (HCC) Active Problems:   Spinal cord injury at T1-T6 level [facility])   Past Medical History:  Past Medical History:  Diagnosis Date   Anxiety    Asthma    as a child   Depression    Narcotic abuse (HCC)    Pulmonary nodules 04/29/2024   Past Surgical History:  Past Surgical History:  Procedure Laterality Date   CLOSED REDUCTION FINGER WITH PERCUTANEOUS PINNING Right 06/22/2013   Procedure: RIGHT HAND SMALL FINGER CLOSED MANIPULATION AND PINNING;  Surgeon: Prentice LELON Pagan, MD;  Location: [facility] OR;  Service: Orthopedics;  Laterality: Right;   HUMERUS IM NAIL Right 05/21/2024   Procedure: INSERTION, INTRAMEDULLARY ROD, HUMERUS;  Surgeon: Dozier Soulier, MD;  Location: [facility] OR;  Service: Orthopedics;  Laterality: Right;   I & D EXTREMITY  03/24/2012   Procedure: IRRIGATION AND DEBRIDEMENT EXTREMITY;  Surgeon: Oneil JAYSON Herald, MD;  Location: [facility] OR;  Service: Orthopedics;  Laterality: Right;  Right Forearm   POSTERIOR CERVICAL FUSION/FORAMINOTOMY N/A 05/05/2024   Procedure: POSTERIOR CERVICAL FUSION CERVICAL SEVEN-THORACIC ONE, THORACIC ONE-THORACIC TWO, THORACIC TWO-THORACIC THREE ,FOR RESECTION OF TUMOR THORACIC TWO CORPECTOMY;  Surgeon: Darnella Dorn SAUNDERS, MD;  Location: [facility] OR;  Service: Neurosurgery;  Laterality: N/A;    Assessment & Plan Clinical Impression: STATON MARKEY is a 43 year old male with PMH of asthma, anxiety, depression, with history of polysubstance use disorder  who presented to [facility] on 04/27/2024 due to worsening low back pain, rib  pain for several weeks. Due to pain, he endorsed difficulty walking and saddle anesthesia. In ED he was hemodynamically stable, A chest xray showed possible LUL nodule and degenerative changes in the lumbar spine. Right should x-ray showed humeral lucency in the proximal humerus, differential considerations included metastatic disease or multiple myeloma. CT scan showed a large Schmorl's node at the inferior L5 endplate. CT angiogram of chest showed right upper lobe segmental pulmonary embolism with incidental findings of right lower lobe nodules, enlarged and heterogeneous left thyroid  lobe measuring up to 5 cm with right tracheal deviation. Nonemergent thyroid  ultrasound recommended. Heparin  was initiated for pulmonary embolism, patient transferred to [facility] for admission.    Due to difficulty with ambulation and LE sensory changes, neurology was consulted and recommended full spine MRI which showed a pathologic fracture at T2 with several spinal canal stenosis and cord compression L > R with subtle thoracic cord signal change suggesting early cord edema. He was transferred to [facility] on 11/9 for neurosurgery consultation and operative intervention after eliquis  wash out. Patient underwent a posterior T1-3 instrumentation and fusion with T1-3 laminectomy, T2 corpectomy for resection of metastatic tumor by Dr. Darnella on 11/13. 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 Oncology was consulted and reccommended outpatient follow up with ENT, radiation oncology, and endocrinology.    Palliative care consulted for goals of care and pain management. A prevena suction vac was placed by [facility] on 11/24, discontinued on 11/27. He continues on Eliquis . On 11/17 he developed a  fever where a workup was negative, WBC trended down. He was admitted to [facility] on 11/24 where he was  significantly limited by right shoulder pain, initially suspected to be rotator cuff pathology. A MRI completed on 11/29 showed a large tumor with associated pathological fracture of the proximal right humerus. He was admitted back to acute care services. Dr. Dozier performed IMN of R humerus on 11/29. Post op he was WBAT to the RUE. Hospital course complicated by chronic pain with palliative care was consulted for Radiation oncology consulted for palliative radiation to the spine and humerus with plans for 2-week course beginning 12/11 to the spine and 12/18 to the humerus.  He will follow-up with oncology as outpatient for systemic treatment. Palliative care continues to follow of pain/symptom management.    Prior to arrival the patient was independent and driving without use of DME. He currently requires min assist with mobility and basic ADLs.  Therapy evaluations completed due to patient decreased functional mobility was admitted for a comprehensive rehab program.    Patient transferred to CIR on 06/02/2024 .    Patient currently requires mod with basic self-care skills secondary to muscle weakness, decreased cardiorespiratoy endurance, and decreased standing balance, decreased postural control, and decreased balance strategies.  Prior to hospitalization, patient could complete ADLs with independent .  Patient will benefit from skilled intervention to increase independence with basic self-care skills prior to discharge home with care partner.  Anticipate patient will require intermittent supervision and minimal physical assistance and follow up home health.  OT - End of Session Activity Tolerance: Tolerates 10 - 20 min activity with multiple rests Endurance Deficit: Yes Endurance Deficit Description: limited by pain, fatigues quickly OT Assessment Rehab Potential (ACUTE ONLY): Good OT Barriers to Discharge: Decreased caregiver support OT Patient demonstrates impairments in the following  area(s): Balance;Endurance;Motor;Pain;Safety OT Basic ADL's Functional Problem(s): Bathing;Dressing;Toileting OT Advanced ADL's Functional Problem(s): None OT Transfers Functional Problem(s): Toilet;Tub/Shower OT Additional Impairment(s): None OT Plan OT Intensity: Minimum of 1-2 x/day, 45 to 90 minutes OT Frequency: 5 out of 7 days OT Duration/Estimated Length of Stay: 10-12 days OT Treatment/Interventions: Balance/vestibular training;Self Care/advanced ADL retraining;UE/LE Coordination activities;Functional mobility training;Visual/perceptual remediation/compensation;Skin care/wound managment;Community reintegration;Neuromuscular re-education;Splinting/orthotics;Wheelchair propulsion/positioning;Discharge planning;Pain management;Therapeutic Activities;Disease mangement/prevention;Patient/family education;Therapeutic Exercise;DME/adaptive equipment instruction;Psychosocial support;UE/LE Strength taining/ROM OT Self Feeding Anticipated Outcome(s): no goal set OT Basic Self-Care Anticipated Outcome(s): min A- (S) OT Toileting Anticipated Outcome(s): CGA OT Bathroom Transfers Anticipated Outcome(s): (S) OT Recommendation Recommendations for Other Services: Neuropsych consult Patient destination: Home Follow Up Recommendations: Home health OT Equipment Recommended: To be determined   OT Evaluation Precautions/Restrictions  Precautions Precautions: Fall;Back;Shoulder Type of Shoulder Precautions: WBAT R UE Shoulder Interventions: Shoulder sling/immobilizer Precaution Booklet Issued: Yes (comment) Recall of Precautions/Restrictions: Intact Required Braces or Orthoses: Sling Restrictions Weight Bearing Restrictions Per Provider Order: Yes RUE Weight Bearing Per Provider Order: Weight bearing as tolerated RLE Weight Bearing Per Provider Order: Weight bearing as tolerated LLE Weight Bearing Per Provider Order: Weight bearing as tolerated Other Position/Activity Restrictions: Per Dr.  Dozier We will allow weightbearing to tolerance given his lower extremity paraplegia and need for his upper extremities General Chart Reviewed: Yes Family/Caregiver Present: No   Pain Pain Assessment Pain Scale: 0-10 Pain Score: 8  Pain Location: Arm Pain Intervention(s): Medication (See eMAR) Home Living/Prior Functioning Home Living Family/patient expects to be discharged to:: Private residence Living Arrangements: Other relatives Available Help at Discharge: Family, Available 24 hours/day Type of Home: House Home Access: Stairs to enter [employer] of Steps:  2 Entrance Stairs-Rails: None Home Layout: One level Bathroom Shower/Tub: Health Visitor: Handicapped height Bathroom Accessibility: Yes  Lives With: Family IADL History Homemaking Responsibilities: Yes Meal Prep Responsibility: Secondary Laundry Responsibility: Secondary Cleaning Responsibility: Secondary Bill Paying/Finance Responsibility: Secondary Shopping Responsibility: Secondary Current License: Yes Occupation: Full time employment Prior Function Level of Independence: Independent with transfers, Independent with homemaking with ambulation, Independent with basic ADLs, Independent with gait  Able to Take Stairs?: Yes Driving: Yes Vocation: Full time employment Vocation Requirements: delivery work-lifting and carrying Vision Baseline Vision/History: 0 No visual deficits Ability to See in Adequate Light: 0 Adequate Patient Visual Report: No change from baseline Vision Assessment?: No apparent visual deficits Perception  Perception: Within Functional Limits Praxis Praxis: WFL Cognition Cognition Overall Cognitive Status: Within Functional Limits for tasks assessed Arousal/Alertness: Awake/alert Orientation Level: Person;Place;Situation Person: Oriented Place: Oriented Situation: Oriented Memory: Appears intact Awareness: Appears intact Problem Solving: Appears  intact Safety/Judgment: Appears intact Brief Interview for Mental Status (BIMS) Repetition of Three Words (First Attempt): 3 Temporal Orientation: Year: Correct Temporal Orientation: Month: Accurate within 5 days Temporal Orientation: Day: Correct Recall: Sock: Yes, no cue required Recall: Blue: Yes, no cue required Recall: Bed: Yes, no cue required BIMS Summary Score: 15 Sensation Sensation Light Touch: Appears Intact Hot/Cold: Appears Intact Proprioception: Appears Intact Additional Comments: LT intact and symmetrical B LE in all dermatomes Coordination Gross Motor Movements are Fluid and Coordinated: No Fine Motor Movements are Fluid and Coordinated: No Coordination and Movement Description: RUE with mobility restrictions, edema. BLE weak with buckling occasionally Motor  Motor Motor: Paraplegia;Other (comment) Motor - Skilled Clinical Observations: generalized muscle weakness  Trunk/Postural Assessment  Cervical Assessment Cervical Assessment: Within Functional Limits Thoracic Assessment Thoracic Assessment: Exceptions to [employer] Lumbar Assessment Lumbar Assessment: Within Functional Limits Postural Control Postural Control: Deficits on evaluation (delayed and inadequate)  Balance Balance Balance Assessed: Yes Static Sitting Balance Static Sitting - Balance Support: Feet supported Static Sitting - Level of Assistance: 5: Stand by assistance Dynamic Sitting Balance Dynamic Sitting - Balance Support: Feet supported Dynamic Sitting - Level of Assistance: 5: Stand by assistance Static Standing Balance Static Standing - Balance Support: Bilateral upper extremity supported Static Standing - Level of Assistance: 4: Min assist Dynamic Standing Balance Dynamic Standing - Balance Support: Bilateral upper extremity supported Dynamic Standing - Level of Assistance: 4: Min assist Extremity/Trunk Assessment RUE Assessment RUE Assessment: Exceptions to [facility] General Strength  Comments: No ROM at shoulder allowed 2/2 precautions. Edema present in forearm LUE Assessment LUE Assessment: Within Functional Limits  Care Tool Care Tool Self Care Eating   Eating Assist Level: Set up assist    Oral Care    Oral Care Assist Level: Set up assist    Bathing   Body parts bathed by patient: Right arm;Chest;Abdomen;Face;Left arm;Left lower leg;Front perineal area;Buttocks;Right upper leg;Left upper leg;Right lower leg Body parts bathed by helper: Buttocks   Assist Level: Moderate Assistance - Patient 50 - 74%    Upper Body Dressing(including orthotics)   What is the patient wearing?: Pull over shirt   Assist Level: Moderate Assistance - Patient 50 - 74%    Lower Body Dressing (excluding footwear)   What is the patient wearing?: Pants Assist for lower body dressing: Moderate Assistance - Patient 50 - 74%    Putting on/Taking off footwear   What is the patient wearing?: Non-skid slipper socks Assist for footwear: Dependent - Patient 0%       Care Tool Toileting Toileting activity  Assist for toileting: Maximal Assistance - Patient 25 - 49%     Care Tool Bed Mobility Roll left and right activity   Roll left and right assist level: Contact Guard/Touching assist    Sit to lying activity   Sit to lying assist level: Contact Guard/Touching assist    Lying to sitting on side of bed activity   Lying to sitting on side of bed assist level: the ability to move from lying on the back to sitting on the side of the bed with no back support.: Moderate Assistance - Patient 50 - 74%     Care Tool Transfers Sit to stand transfer   Sit to stand assist level: Moderate Assistance - Patient 50 - 74%    Chair/bed transfer   Chair/bed transfer assist level: Minimal Assistance - Patient > 75%     Toilet transfer   Assist Level: Moderate Assistance - Patient 50 - 74%     Care Tool Cognition  Expression of Ideas and Wants Expression of Ideas and Wants: 4. Without  difficulty (complex and basic) - expresses complex messages without difficulty and with speech that is clear and easy to understand  Understanding Verbal and Non-Verbal Content Understanding Verbal and Non-Verbal Content: 4. Understands (complex and basic) - clear comprehension without cues or repetitions   Memory/Recall Ability Memory/Recall Ability : Current season;That he or she is in a hospital/hospital unit   Refer to Care Plan for Long Term Goals  SHORT TERM GOAL WEEK 1 OT Short Term Goal 1 (Week 1): Pt will complete sit  > stand with CGA OT Short Term Goal 2 (Week 1): Pt will complete toileting tasks with mod A OT Short Term Goal 3 (Week 1): Pt will complete functional mobility into the bathroom with CGA using the RW OT Short Term Goal 4 (Week 1): Pt will complete LB dressing with LRAD with min A  Recommendations for other services: Neuropsych   Skilled Therapeutic Intervention ADL ADL Eating: Set up;Supervision/safety Grooming: Setup Where Assessed-Grooming: Wheelchair Upper Body Bathing: Moderate assistance Where Assessed-Upper Body Bathing: Shower Lower Body Bathing: Moderate assistance Where Assessed-Lower Body Bathing: Shower Upper Body Dressing: Moderate assistance Where Assessed-Upper Body Dressing: Sitting at sink Lower Body Dressing: Moderate assistance Where Assessed-Lower Body Dressing: Sitting at sink Toileting: Maximal assistance Where Assessed-Toileting: Bedside Commode Toilet Transfer: Minimal assistance Toilet Transfer Method: Stand pivot Toilet Transfer Equipment: Psychiatric Nurse: Minimal assistance Film/video Editor Method: Warden/ranger: Shower seat with back Mobility  Bed Mobility Bed Mobility: Rolling Right;Rolling Left;Supine to Sit;Sit to Supine Rolling Right: Minimal Assistance - Patient > 75% Rolling Left: Contact Guard/Touching assist Supine to Sit: Contact Guard/Touching assist Sit to  Supine: Moderate Assistance - Patient 50-74% Transfers Sit to Stand: Minimal Assistance - Patient > 75% Stand to Sit: Minimal Assistance - Patient > 75%   Skilled OT evaluation completed with the creation of pt centered OT POC. Pt educated on condition, ELOS, rehab expectations, and fall risk reduction strategies throughout session. Shower completed as described above. Pt wearing sling for comfort but weightbearing on RW for transfers with no big increase in reported pain. Introduced LH sponge, shower chair, and adaptive techniques for bathing independence. He returned to his bed at close of session, bed alarm set and RUE elevated to promote edema management.   Discharge Criteria: Patient will be discharged from OT if patient refuses treatment 3 consecutive times without medical reason, if treatment goals not met, if there is a change  in medical status, if patient makes no progress towards goals or if patient is discharged from hospital.  The above assessment, treatment plan, treatment alternatives and goals were discussed and mutually agreed upon: by patient  Nena VEAR Moats 06/03/2024, 11:46 AM

## 2024-06-03 NOTE — Progress Notes (Signed)
 [name], MD  Physician Physical Medicine and Rehabilitation   PMR Pre-admission    Signed   Date of Service: 06/02/2024 10:54 AM  Related encounter: Admission (Discharged) from 05/21/2024 in [facility]   Signed     Expand All Collapse All  PMR Admission Coordinator Pre-Admission Assessment   Patient: Joseph [name] is an 43 y.o., male MRN: [id number] DOB: 13-Jun-1981 Height: 5' 9 (175.3 cm) Weight: (!) 153 kg   Insurance Information HMO:     PPO:      PCP:      IPA:      80/20:      OTHER:  PRIMARY: [employer]      Policy#: [id number]      Subscriber: pt CM Name: [facility]      Phone#: [phone]     Fax#: [phone] Pre-Cert#: [id number] auth for CIR from [employer] with [employer] for admit 12/10 with next review date 12/24.  Updates due to fax listed above.        Employer:  Benefits:  Phone #:     [phone]     Name:  Eff. Date: 02/22/23     Deduct: $0      Out of Pocket Max: $0      Life Max:  CIR: 100%      SNF: 100% Outpatient:      Co-Pay: $4/visit Home Health:       Co-Pay: $4/visit DME: 100%     Co-Pay:  Providers:  SECONDARY:       Policy#:      Phone#:    Artist:       Phone#:    The Best Boy for patients in Inpatient Rehabilitation Facilities with attached Privacy Act Statement-Health Care Records was provided and verbally reviewed with: Patient and Family   Emergency Contact Information Contact Information       Name Relation Home Work Mobile    [place] Mother [phone]             Other Contacts       Name Relation Home Work Mobile    [place]hin[place] Father     [phone]           Current Medical History  Patient Admitting Diagnosis: pathological SCI and R humerus fx s/p operative fixation of both    History of Present Illness: Pt is a 43 y/o male with PMH of asthma, anxiety, and PSA who presented to [facility] on 11/5 with  c/o low back pain, rib pain for several weeks until he was having difficulty walking and saddle anesthesia. In ED he was hemodynamically stable, and xray showed possible LUL nodule, and degenerative changes in the lumbar spine. CT of lumbar spine showed large Schmorl's node at the L5 endplate. CTA chest showed RUL segmental PE with incidental RLL nodules, enlarge L lobe of the thyroid , and gallstones. He was started on heparin  gtt and admitted to [facility] on 11/6. He continued to have difficulty with ambulation and LE sensory changes so neurology was consulted and recommended full spine MRI which showed a pathologic fracture at T2 with several spinal canal stenosis and cord compression L>R with subtle thoracic cord signal change suggesting early cord edema. Xray shoulder showed humeral lucency concerning for malignancy. He was then transferred to [facility] on 11/9 for neurosurgery consultation. Recommendations were for operative intervention after eliquis  wash out. Pt underwent a posterior T1-3 instrumentation and  fusion, with T1-3 laminectomy, T2 corpectomy for resection of metastatic tumor per dr. Darnella on 11/13. 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 Oncology was consulted and recommend outpatient follow up with ENT, radiation oncology, and endocrinology. Palliative consulted for goals and pain management. He has a prevena vac to his surgical incision which will be d/c'd on 11/27. He has been weaned off of dexamethasone . He is currently on eliquis . He did develop a fever the week of 11/17 but workup was negative and he has been afebrile with WBC trending down. His PICC line was d/c'd on 11/24 in anticipation of transition to CIR and he admitted to CIR on the same day.  On rehab he was significantly limited by suspected rotator cuff pathology, requiring total assist for mobility/adls.  MRI of RUE  completed and read 11/28 which showed large tumor with associated pathological fracture of proximal right humerus.  He was admitted back to the acute setting on 11/28 for intervention.  He underwent a IMN of R humerus per Dr. Dozier on 11/29 and post operatively he is to be WBAT to RUE.  Post op course significant for pain control in the setting of PMH of PSA, and planning/coordination of cancer treatment.  Radiation oncology consulted for palliative radiation to the spine and humerus.  Plan for 2 week course beginning 12/11 to the spine and 12/18 to the humerus.  He will f/u with oncology as an outpatient for any systemic treatment.  Therapy ongoing and pt has made remarkable progress with recommendations to return to CIR.    Patient's medical record from [facility] has been reviewed by the rehabilitation admission coordinator and physician.   Past Medical History      Past Medical History:  Diagnosis Date   Anxiety     Asthma      as a child   Depression     Narcotic abuse (HCC)     Pulmonary nodules 04/29/2024          Has the patient had major surgery during 100 days prior to admission? Yes   Family History   family history includes Alcohol abuse in an other family member; Other in an other family member.   Current Medications Current Medications    Current Facility-Administered Medications:    acetaminophen  (TYLENOL ) tablet 650 mg, 650 mg, Oral, Q6H PRN, 650 mg at 05/23/24 0819 **OR** acetaminophen  (TYLENOL ) suppository 650 mg, 650 mg, Rectal, Q6H PRN, Opyd, Timothy S, MD   albuterol  (PROVENTIL ) (2.5 MG/3ML) 0.083% nebulizer solution 2.5 mg, 2.5 mg, Nebulization, Q6H PRN, Opyd, Timothy S, MD   apixaban  (ELIQUIS ) tablet 5 mg, 5 mg, Oral, BID, Bitonti, Michael T, RPH, 5 mg at 06/02/24 1027   bisacodyl  (DULCOLAX) EC tablet 5 mg, 5 mg, Oral, Daily PRN, Opyd, Timothy S, MD   cyclobenzaprine  (FLEXERIL ) tablet 10 mg, 10 mg, Oral, TID, Opyd, Timothy S, MD, 10 mg at 06/02/24 1027    guaiFENesin  (MUCINEX ) 12 hr tablet 1,200 mg, 1,200 mg, Oral, BID, Sheikh, Omair Latif, DO, 1,200 mg at 06/02/24 1027   HYDROmorphone  (DILAUDID ) injection 0.5-1 mg, 0.5-1 mg, Intravenous, Q4H PRN, Claudene Gauze M, NP, 1 mg at 06/02/24 9170   iron  polysaccharides (NIFEREX) capsule 150 mg, 150 mg, Oral, Daily, Sheikh, Omair Latif, DO, 150 mg at 06/02/24 1027   lidocaine  (LIDODERM ) 5 % 1 patch, 1 patch, Transdermal, Daily, Sherrill Cable [place], DO, 1 patch at 05/31/24 9188   methadone  (  DOLOPHINE ) tablet 10 mg, 10 mg, Oral, Q8H, Smith, [employer], NP, 10 mg at 06/02/24 0559   methocarbamol  (ROBAXIN ) tablet 750 mg, 750 mg, Oral, TID AC & HS, Opyd, Timothy S, MD, 750 mg at 06/02/24 9170   multivitamin with minerals tablet 1 tablet, 1 tablet, Oral, Daily, Sheikh, Omair Latif, DO, 1 tablet at 06/02/24 1026   ondansetron  (ZOFRAN ) tablet 4 mg, 4 mg, Oral, Q6H PRN **OR** ondansetron  (ZOFRAN ) injection 4 mg, 4 mg, Intravenous, Q6H PRN, Porterfield, Amber, PA-C   oxyCODONE  (Oxy IR/ROXICODONE ) immediate release tablet 30 mg, 30 mg, Oral, Q3H PRN, Claudene Gauze M, NP, 30 mg at 06/02/24 0146   polyethylene glycol (MIRALAX  / GLYCOLAX ) packet 17 g, 17 g, Oral, Daily, Briana Elgin LABOR, MD, 17 g at 06/02/24 1027   pregabalin  (LYRICA ) capsule 50 mg, 50 mg, Oral, TID, Opyd, Timothy S, MD, 50 mg at 06/02/24 1027   protein supplement (ENSURE MAX) liquid, 11 oz, Oral, BID, Sheikh, Omair Latif, DO, 11 oz at 06/02/24 1028   senna-docusate (Senokot-S) tablet 2 tablet, 2 tablet, Oral, BID, Opyd, Timothy S, MD, 2 tablet at 06/02/24 1026   sodium phosphate  (FLEET) enema 1 enema, 1 enema, Rectal, Once PRN, Opyd, Timothy S, MD      Patients Current Diet:  Diet Order                  Diet regular Room service appropriate? Yes; Fluid consistency: Thin  Diet effective now                         Precautions / Restrictions Precautions Precautions: Fall, Back, Shoulder Type of Shoulder Precautions: elbow/wrist/hand ROM only,  WBAT for transfers per op note Precaution Booklet Issued: Yes (comment) Precaution/Restrictions Comments: painful R shoulder, present prior to admission but pt states worse now Restrictions Weight Bearing Restrictions Per Provider Order: Yes RUE Weight Bearing Per Provider Order: Non weight bearing RLE Weight Bearing Per Provider Order: Weight bearing as tolerated LLE Weight Bearing Per Provider Order: Weight bearing as tolerated Other Position/Activity Restrictions: Per Dr. Dozier We will allow weightbearing to tolerance given his lower extremity paraplegia and need for his upper extremities    Has the patient had 2 or more falls or a fall with injury in the past year? Yes   Prior Activity Level Community (5-7x/wk): independent without DME prior to admit, driving   Prior Functional Level Self Care: Did the patient need help bathing, dressing, using the toilet or eating? Independent   Indoor Mobility: Did the patient need assistance with walking from room to room (with or without device)? Independent   Stairs: Did the patient need assistance with internal or external stairs (with or without device)? Independent   Functional Cognition: Did the patient need help planning regular tasks such as shopping or remembering to take medications? Independent   Patient Information Are you of Hispanic, Latino/a,or Spanish origin?: A. No, not of Hispanic, Latino/a, or Spanish origin What is your race?: B. Black or African American Do you need or want an interpreter to communicate with a doctor or health care staff?: 0. No   Patient's Response To:  Health Literacy and Transportation Is the patient able to respond to health literacy and transportation needs?: Yes Health Literacy - How often do you need to have someone help you when you read instructions, pamphlets, or other written material from your doctor or pharmacy?: Rarely In the past 12 months, has lack of transportation kept you  from  medical appointments or from getting medications?: No In the past 12 months, has lack of transportation kept you from meetings, work, or from getting things needed for daily living?: No   Journalist, Newspaper / Equipment Home Equipment: [place] - single point, Information systems manager, BSC/3in1   Prior Device Use: Indicate devices/aids used by the patient prior to current illness, exacerbation or injury? None of the above   Current Functional Level Cognition   Orientation Level: Oriented X4    Extremity Assessment (includes Sensation/Coordination)   Upper Extremity Assessment: Right hand dominant, RUE deficits/detail RUE Deficits / Details: elbow/wrist/hand ROM WFL. RUE Coordination: decreased gross motor  Lower Extremity Assessment: Defer to PT evaluation RLE Sensation: decreased light touch LLE Sensation: decreased light touch     ADLs   Overall ADL's : Needs assistance/impaired Eating/Feeding: Set up, Sitting Eating/Feeding Details (indicate cue type and reason): for cutting up food Grooming: Oral care, Wash/dry face, Set up, Sitting Grooming Details (indicate cue type and reason): Setup to wash face and ears, Mod A to don deodorant - assist to don under L arm. Pt able to use LUE to don under R arm Upper Body Bathing: Moderate assistance, Sitting Upper Body Bathing Details (indicate cue type and reason): Mod A for posterior bathing Lower Body Bathing: Maximal assistance, Sit to/from stand, Sitting/lateral leans Lower Body Bathing Details (indicate cue type and reason): assist to bathe bottom in standing Upper Body Dressing : Maximal assistance Upper Body Dressing Details (indicate cue type and reason): Max A to doff UB dressing Lower Body Dressing: Maximal assistance, Bed level Lower Body Dressing Details (indicate cue type and reason): max A don socks bed level Toilet Transfer: Moderate assistance, +2 for physical assistance, +2 for safety/equipment, Stand-pivot (R platform RW) Toilet  Transfer Details (indicate cue type and reason): STS with stedy this session Toileting- Clothing Manipulation and Hygiene: Maximal assistance Toileting - Clothing Manipulation Details (indicate cue type and reason): Pt used bedside urinal while supine     Mobility   Overal bed mobility: Needs Assistance Bed Mobility: Sit to Sidelying, Rolling Rolling: Supervision Sidelying to sit: Supervision Supine to sit: Supervision, HOB elevated, Used rails Sit to supine: Mod assist, Used rails Sit to sidelying: Min assist, Used rails General bed mobility comments: Assist for BLE over edge of bed, cues for log roll, pt using rail PRN to return to supine     Transfers   Overall transfer level: Needs assistance Equipment used: Right platform walker Transfers: Sit to/from Stand, Bed to chair/wheelchair/BSC Sit to Stand: Min assist, From elevated surface, +2 safety/equipment Bed to/from chair/wheelchair/BSC transfer type:: Step pivot Step pivot transfers: Min assist, +2 safety/equipment Transfer via Lift Equipment: Stedy General transfer comment: chair>RW and RW> EOB via step pivot toward his R, pt able to maintain standing a couple mins while bed linens changed by NT.     Ambulation / Gait / Stairs / Wheelchair Mobility   Ambulation/Gait Ambulation/Gait assistance: Contact guard assist, +2 safety/equipment Gait Distance (Feet): 40 Feet Assistive device: Right platform walker Gait Pattern/deviations: Step-through pattern, Decreased stride length, Narrow base of support General Gait Details: pt defers due to pain/fatigue, and visitors arriving to his room with his dinner while pt returned to bed Gait velocity: decr     Posture / Balance Dynamic Sitting Balance Sitting balance - Comments: Sit EOB with supervision Balance Overall balance assessment: History of Falls, Needs assistance Sitting-balance support: Feet supported, No upper extremity supported Sitting balance-Leahy Scale: Good Sitting  balance - Comments:  Sit EOB with supervision Standing balance support: Bilateral upper extremity supported, During functional activity, Reliant on assistive device for balance Standing balance-Leahy Scale: Poor Standing balance comment: Dependent on platform walker and external support     Special considerations/life events  Skin surgical incision to back and RUE and Special service needs radiation treatments starting on 12/11 and to occur every weekday through 12/24, outpatient f/u with oncology for systemic treatment plan    Previous Home Environment (from acute therapy documentation) Living Arrangements: Spouse/significant other Available Help at Discharge: Family, Available 24 hours/day Type of Home: House Home Layout: One level Home Access: Stairs to enter Entrance Stairs-Rails: None Entrance Stairs-Number of Steps: 2 Bathroom Shower/Tub: Health Visitor: Handicapped height Home Care Services: No   Discharge Living Setting Plans for Discharge Living Setting: Lives with (comment) (aunt) Type of Home at Discharge: House Discharge Home Layout: One level Discharge Home Access: Stairs to enter Entrance Stairs-Rails: None Entrance Stairs-Number of Steps: 1 Discharge Bathroom Shower/Tub: Walk-in shower Discharge Bathroom Toilet: Standard Discharge Bathroom Accessibility: Yes How Accessible: Accessible via walker Does the patient have any problems obtaining your medications?: No   Social/Family/Support Systems Anticipated Caregiver: Patient will stay with aunt Lyndell Cook) who can provide supervision, other family available PRN to assist. Anticipated Caregiver's Contact Information: [place] [phone] Ability/Limitations of Caregiver: pt's aunt undergoing chemo per chart review Caregiver Availability: 24/7 Discharge Plan Discussed with Primary Caregiver: Yes Is Caregiver In Agreement with Plan?: Yes Does Caregiver/Family have Issues with Lodging/Transportation while  Pt is in Rehab?: No   Goals Patient/Family Goal for Rehab: PT/OT supervision to mod I ambulatory level with RW, SLP n/a Expected length of stay: 10-14 days Additional Information: Discharge plan: will d/c to aunt's home where he will have supervision from her and other family up to 24/7 if needed. Pt/Family Agrees to Admission and willing to participate: Yes Program Orientation Provided & Reviewed with Pt/Caregiver Including Roles  & Responsibilities: Yes   Decrease burden of Care through IP rehab admission: n/a   Possible need for SNF placement upon discharge:  No.    Patient Condition: I have reviewed medical records from [facility], spoken with [facility] team, and patient and family member. I met with patient at the bedside for inpatient rehabilitation assessment.  Patient will benefit from ongoing PT and OT, can actively participate in 3 hours of therapy a day 5 days of the week, and can make measurable gains during the admission.  Patient will also benefit from the coordinated team approach during an Inpatient Acute Rehabilitation admission.  The patient will receive intensive therapy as well as Rehabilitation physician, nursing, social worker, and care management interventions.  Due to bladder management, bowel management, safety, skin/wound care, disease management, medication administration, pain management, and patient education the patient requires 24 hour a day rehabilitation nursing.  The patient is currently min assist with mobility and basic ADLs.  Discharge setting and therapy post discharge at home with outpatient is anticipated.  Patient has agreed to participate in the Acute Inpatient Rehabilitation Program and will admit today.   Preadmission Screen Completed By:  Reche FORBES Lowers, 06/02/2024 10:54 AM ______________________________________________________________________   Discussed status with Dr. Lovorn on 06/02/2024 at 10:54 AM and received approval for admission today.   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 Yes Co-Morbidities requiring supervision/potential complications: Thyroid  ca with mets to spine and lungs; PE 12/6- on Eliquis ;  goal for thyroiectomy and RAI; radiation starting 12/11 to spine- and 12/18 to humerus; hx of polysubstance use d/o Due to bladder management, bowel management, safety, skin/wound care, disease management, medication administration, pain management, and patient education, does the patient require 24 hr/day rehab nursing? Yes Does the patient require coordinated care of a physician, rehab nurse, PT, OT, to address physical and functional deficits in the context of the above medical diagnosis(es)? Yes Addressing deficits in the following areas: balance, endurance, locomotion, strength, transferring, bowel/bladder control, bathing, dressing, feeding, grooming, and toileting Can the patient actively participate in an intensive therapy program of at least 3 hrs of therapy 5 days a week? Yes The potential for patient to make measurable gains while on inpatient rehab is good Anticipated functional outcomes upon discharge from inpatient rehab: modified independent and supervision PT, modified independent and supervision OT, n/a SLP Estimated rehab length of stay to reach the above functional goals is: 10-14 days Anticipated discharge destination: Home 10. Overall Rehab/Functional Prognosis: good     MD Signature:           Revision History  Date/Time User Provider Type Action  06/02/2024 12:19 PM [name], MD Physician Sign  06/02/2024 10:54 AM Butler Reche BRAVO, PT Rehab Admission Coordinator Share  05/31/2024  10:44 AM Butler Reche BRAVO, PT Rehab Admission Coordinator Share   View Details Report

## 2024-06-03 NOTE — Progress Notes (Signed)
 Inpatient Rehabilitation  Patient information reviewed and entered into eRehab system by [facility]. Karen Kays., CCC/SLP, PPS Coordinator.  Information including medical coding, functional ability and quality indicators will be reviewed and updated through discharge.

## 2024-06-03 NOTE — Progress Notes (Signed)
 Patient ID: DANE KOPKE, male   DOB: 25-May-1981, 43 y.o.   MRN: [id number]   SW spoke with [facility] and set up the following Cancer Center Times:  12/15 p/u at 2:15pm; appt 3:15pm  12/16 p/u at 2:00pm; appt 3pm  12/17 p/u at 2:15pm; appt 3:15pm  12/18 p/u at 1:45pm; appt 2:45pm  12/19 p/u at 2:15pm; appt 3:15pm  12/22 p/u at 2:20pm; appt 3:20pm  12/23 p/u at 2:10pm; appt 3:10pm  12/24 p/u at 11:45am; appt 12:45pm   Graeme Jude, MSW, LCSW Office: [phone] Cell: [phone] Fax: [phone]

## 2024-06-04 DIAGNOSIS — K5901 Slow transit constipation: Secondary | ICD-10-CM

## 2024-06-04 NOTE — Progress Notes (Signed)
 Occupational Therapy Session Note  Patient Details  Name: Joseph [name] MRN: [id number] Date of Birth: 1980-07-24  Today's Date: 06/04/2024 OT Individual Time: 9199-9084 OT Individual Time Calculation (min): 75 min    Short Term Goals: Week 1:  OT Short Term Goal 1 (Week 1): Pt will complete sit  > stand with CGA OT Short Term Goal 2 (Week 1): Pt will complete toileting tasks with mod A OT Short Term Goal 3 (Week 1): Pt will complete functional mobility into the bathroom with CGA using the RW OT Short Term Goal 4 (Week 1): Pt will complete LB dressing with LRAD with min A  Skilled Therapeutic Interventions/Progress Updates:    InitialLEncounter:  Patient in bed resting at the time of arrival. The pt reported that he was able to rest okay during the night, however he reported a pain response of 6 on 0-10 for R shld pain. The pt was in agreement with completing BADL related task in bathing/dressing in preparation for his day. Nursing was informed that the pt was requesting something to address his pain. Pain was decreased with positioning the pt with pillows to improve the position of his shld . Nursing was also able to to provide medication to address his pain as well.   BADL in Bathing/Dressing:  The pt was able to transfer from supine to EOB with MinA using the bed rails. The pt's splint was donned on the R shld at Dep. The pt was able to transfer from EOB to w/c LOF with CGA using the RW with additional time. The pt was transported to the sink and was able to brush his teeth and wash his face with s/uA.  The pt was able to wash his chest and upper legs with s/uA.  The pt was able to come from sit to stand for bathing his LB, inclusive of peri care for the front with s/uA, the pt was ModA for his bottom. The pt was able to donn his over head shirt with s/uA, he was ModA for donning his shorts and Dep with his socks.  At the end of the session, the pt remained at w/c LOF with his feet  supported, and his chair alarm in place. All additional needs were addressed prior to exiting the room.   Therapy Documentation Precautions:  Precautions Precautions: Fall, Back, Shoulder Type of Shoulder Precautions: WBAT R UE Shoulder Interventions: Shoulder sling/immobilizer Precaution Booklet Issued: Yes (comment) Recall of Precautions/Restrictions: Intact Required Braces or Orthoses: Sling Restrictions Weight Bearing Restrictions Per Provider Order: Yes RUE Weight Bearing Per Provider Order: Weight bearing as tolerated RLE Weight Bearing Per Provider Order: Weight bearing as tolerated LLE Weight Bearing Per Provider Order: Weight bearing as tolerated Other Position/Activity Restrictions: Per Dr. Dozier We will allow weightbearing to tolerance given his lower extremity paraplegia and need for his upper extremities Therapy/Group: Individual Therapy  [name] 06/04/2024, 3:35 PM

## 2024-06-04 NOTE — Progress Notes (Signed)
 PROGRESS NOTE   Subjective/Complaints:  Pt doing well, slept well, pain well managed. LBM 2d ago. Urinating fine. No other complaints or concerns.    ROS: Per HPI  Pt denies SOB, abd pain, CP, N/V/C/D, and vision changes   Objective:   No results found. Recent Labs    06/02/24 0404 06/03/24 0537  WBC 7.0 7.7  HGB 9.9* 9.7*  HCT 32.5* 30.9*  PLT 94* PLATELET CLUMPS NOTED ON SMEAR, UNABLE TO ESTIMATE   Recent Labs    06/02/24 0404 06/03/24 0537  NA 135 135  K 3.9 4.0  CL 101 102  CO2 26 29  GLUCOSE 123* 108*  BUN 11 11  CREATININE 0.60* 0.63  CALCIUM 8.3* 8.6*    Intake/Output Summary (Last 24 hours) at 06/04/2024 1137 Last data filed at 06/04/2024 9385 Gross per 24 hour  Intake 960 ml  Output 1075 ml  Net -115 ml        Physical Exam: Vital Signs Blood pressure 120/75, pulse 81, temperature 97.7 F (36.5 C), temperature source Oral, resp. rate 18, height 5' 9 (1.753 m), weight (!) 148.7 kg, SpO2 95%.   General: awake, alert, appropriate, sitting up slightly in bed; NAD HENT: conjugate gaze; oropharynx moist CV: regular rate and rhythm; no JVD Pulmonary: CTA B/L; no W/R/R- good air movement GI: soft, NT, (+)BS but a little hypoactive, protuberant but ND Psychiatric: appropriate- but slightly flat Neurological: Ox3 Extremities: RUE in sling  PRIOR EXAMS: Musculoskeletal:     Cervical back: Neck supple.     Comments: LUE 5/5 RUE- at least 3/5 proximally; and 5-/5 distally- limited by pain and surgery of proximal humerus RLE- HF 3-/5; KE/KF 4+/5; DF 4/5 and PF 3-/5 LLE- HF 3+/5; KE/KF 5-/5 and DF/PF 5-/5  Skin:    General: Skin is warm and dry.     Comments: Bandage seen on R proximal arm- c/d/I Didn't assess thoracic incision- due to pt's sleepiness-didn't turn over  Neurological:     Comments: 5-6 beats clonus B/L No hoffman's MAS of 1 in LE's- much better Intact to light touch in  all 4 extremities   Assessment/Plan: 1. Functional deficits which require 3+ hours per day of interdisciplinary therapy in a comprehensive inpatient rehab setting. Physiatrist is providing close team supervision and 24 hour management of active medical problems listed below. Physiatrist and rehab team continue to assess barriers to discharge/monitor patient progress toward functional and medical goals  Care Tool:  Bathing    Body parts bathed by patient: Right arm, Chest, Abdomen, Face, Left arm, Left lower leg, Front perineal area, Buttocks, Right upper leg, Left upper leg, Right lower leg   Body parts bathed by helper: Buttocks     Bathing assist Assist Level: Moderate Assistance - Patient 50 - 74%     Upper Body Dressing/Undressing Upper body dressing   What is the patient wearing?: Pull over shirt    Upper body assist Assist Level: Moderate Assistance - Patient 50 - 74%    Lower Body Dressing/Undressing Lower body dressing      What is the patient wearing?: Pants     Lower body assist Assist for lower body dressing: Moderate  Assistance - Patient 50 - 74%     Toileting Toileting    Toileting assist Assist for toileting: Maximal Assistance - Patient 25 - 49%     Transfers Chair/bed transfer  Transfers assist     Chair/bed transfer assist level: Contact Guard/Touching assist     Locomotion Ambulation   Ambulation assist      Assist level: Contact Guard/Touching assist Assistive device: Walker-rolling Max distance: 6ft   Walk 10 feet activity   Assist     Assist level: Contact Guard/Touching assist Assistive device: Walker-rolling   Walk 50 feet activity   Assist Walk 50 feet with 2 turns activity did not occur: Safety/medical concerns (weakness/fatigue/pain)  Assist level: Contact Guard/Touching assist Assistive device: Walker-rolling    Walk 150 feet activity   Assist Walk 150 feet activity did not occur: Safety/medical concerns  (weakness/fatigue/pain)         Walk 10 feet on uneven surface  activity   Assist Walk 10 feet on uneven surfaces activity did not occur: Safety/medical concerns (weakness/fatigue/pain)         Wheelchair     Assist Is the patient using a wheelchair?: Yes Type of Wheelchair: Manual    Wheelchair assist level: Maximal Assistance - Patient 25 - 49% (B LE and L UE) Max wheelchair distance: 10 ft    Wheelchair 50 feet with 2 turns activity    Assist        Assist Level: Dependent - Patient 0%   Wheelchair 150 feet activity     Assist      Assist Level: Dependent - Patient 0%   Blood pressure 120/75, pulse 81, temperature 97.7 F (36.5 C), temperature source Oral, resp. rate 18, height 5' 9 (1.753 m), weight (!) 148.7 kg, SpO2 95%.   Medical Problem List and Plan: 1. 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              -patient may  shower             -ELOS/Goals: ~ 14 days- supervision             Admit to CIR Appropriate for CIR- didn't finish first time due to R humeral pathological fx due to Thyroid  cancer mets  -Con't CIR PT and OT 2.  Antithrombotics: -DVT/anticoagulation:  Mechanical: Sequential compression devices, below knee Bilateral lower extremities Pharmaceutical: Eliquis  5mg  BID             -antiplatelet therapy: N/A   3. Pain Management: Robaxin  750 TID AC/HS, Lyrica  50 mg TID, Methadone  10 mg TID. Flexeril  10mg  TID, lidoderm  patches. Oxycodone  30 mg q3 hours as needed for breakthrough pain. Palliative care to follow.  12/12- slept so well, pain worse this AM- currently getting pain meds- won't increase- please don't increase over weekend  4. Mood/Behavior/Sleep: LCSW to follow for evaluation and support when available.              -antipsychotic agents: n/a   -Trazodone  PRN  5. Neuropsych/cognition: This patient is capable of making decisions on his  own behalf.   6. Skin/Wound Care: Routine pressure relief measures. Monitor incisions   7. Fluids/Electrolytes/Nutrition: Monitor I&O and weight. Follow up labs CBC/CMP, continue vitamins/supplements.    8. 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 Completed course of dexamethasone . Patient to follow up outpatient with ENT, endocrinology and radiation oncology. ENT plan to discuss at multidisciplinary tumor board on 06/08/24.             -  Will likely need Thyroidectomy and RAI-waiting for tumor board  9. Pathological right proximal humerus fracture: s/p IMR 11/29. WBAT in RUE due to needing that- use platform walker.  Ortho follow up in 2 weeks for recheck and updated xrays.  -Patient to remain in sling.   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 Patient underwent instrumentation and fusion on prior admission. Radiation Onc plans 2 weeks of radiotherapy to thoracic spine starts 06/02/24- right humerus on 06/09/24.   11. Hx of PE: dx on 11/6 provoked secondary to metastatic cancer. Continue Eliquis  for 3-6 months.   12. Paraplegia/Neurogenic bladder: Monitor bladder scan with PVR  12/12- will order in/out caths and bladder scans q6 hours- to see if needs cathing -06/04/24 no cathing overnight, urinating well   13. Chronic normocytic anemia: Stable, continue Niferex 150 mg daily    14. Constipation/mild neurogenic bowel: Continue Miralax   daily and Senokot S 2 tabs BID -06/04/24 LBM 2d ago, if no BM by tomorrow might need additional meds.    15. Polysubstance use disorder- will have palliative care f/u with meds after d/c for wean     LOS: 2 days A FACE TO FACE EVALUATION WAS PERFORMED  [address] 06/04/2024, 11:37 AM

## 2024-06-04 NOTE — Plan of Care (Signed)
°  Problem: SCI BOWEL ELIMINATION Goal: RH STG MANAGE BOWEL WITH ASSISTANCE Description: STG Manage Bowel with supervision Assistance. Outcome: Progressing   Problem: SCI BLADDER ELIMINATION Goal: RH STG MANAGE BLADDER WITH ASSISTANCE Description: STG Manage Bladder With supervision Assistance Outcome: Progressing   Problem: RH SKIN INTEGRITY Goal: RH STG SKIN FREE OF INFECTION/BREAKDOWN Description: Manage skin free from infection with supervision assistance Outcome: Progressing   Problem: RH SAFETY Goal: RH STG ADHERE TO SAFETY PRECAUTIONS W/ASSISTANCE/DEVICE Description: STG Adhere to Safety Precautions With supervision Assistance/Device. Outcome: Progressing   Problem: RH PAIN MANAGEMENT Goal: RH STG PAIN MANAGED AT OR BELOW PT'S PAIN GOAL Description: <4 w/ prns Outcome: Progressing   Problem: RH KNOWLEDGE DEFICIT SCI Goal: RH STG INCREASE KNOWLEDGE OF SELF CARE AFTER SCI Outcome: Progressing

## 2024-06-04 NOTE — Progress Notes (Signed)
 Physical Therapy Session Note  Patient Details  Name: Joseph [name] MRN: [id number] Date of Birth: 01/27/1981  Today's Date: 06/04/2024 PT Individual Time: 9053-8958 and 8654-8557 PT Individual Time Calculation (min): 55 min and 57 min PT Missed Time: 18 minutes PT Missed Time Reason: Fatigue and Pain  Short Term Goals: Week 1:  PT Short Term Goal 1 (Week 1): Pt will perform sit to supine transfers with min A PT Short Term Goal 2 (Week 1): Pt will perform sit <> stand transfers with CGA PT Short Term Goal 3 (Week 1): Pt will perform bed <> WC transfers with CGA  Skilled Therapeutic Interventions/Progress Updates:   Treatment Session 1 Received pt semi-reclined in bed, pt agreeable to PT treatment, and reported pain 6/10 in R arm (premedicated). Session with emphasis on functional mobility/transfers, generalized strengthening and endurance, dynamic standing balance/coordination, and ambulation. Pt transferred semi-reclined<>sitting L EOB with HOB elevated and use of bedrails with supervision and ++ time/effort while guarding R arm.   Pt performed all transfers with RW and CGA/min A (min A to stand from lower surfaces but fading to mod A towards end of session with increasing fatigue). Pt transported to/from room in [facility] dependently for energy conservation purposes. Pt ambulated 70ft x 2 trials with RW and CGA with WC follow - limited by fatigue and required extensive seated rest/water  breaks. Ambulated 8ft to staircase, but needed to sit and limited by fatigue and pain in RUE (donned sling with max A) and ultimately unable to navigate stairs. Transitioned to the following seated exercises with emphasis on LE strength/endurance: -LAQ with 1.5lb ankle weight 2x12 bilaterally -hip flexion with 1.5lb ankle weight 2x12 bilaterally Pt extremely fatigued by end of session - returned to room and transferred back to bed with RW and CGA (mod A to stand). Transitioned into supine with max A for BLE  management and concluded session with pt semi-reclined in bed, needs within reach, and bed alarm on. RUE supported on pillow for pain management and edema control.   Treatment Session 2 Received pt semi-reclined in bed, pt agreeable to PT treatment, and reported pain in RUE - RN notified at end of session to administer pain medication. Session with emphasis on functional mobility/transfers, generalized strengthening and endurance, dynamic standing balance/coordination, stair navigation, and ambulation. Pt transferred semi-reclined<>sitting L EOB with HOB elevated and use of bedrails with supervision and ++ time/effort.   Pt performed all transfers with RW and CGA throughout session (min A to stand progressing to light mod A with increasing fatigue). Pt transported to/from room in [facility] dependently for time management purposes. Stood at staircase with min A and navigated 6 3in steps with bilateral handrails and min A ascending forwards and descending backwards with a step to pattern - limited by fatigue. In dayroom, pt ambulated 67ft x 1 and 83ft x 1 with RW and CGA with WC follow provided by mother - limited by fatigue. Transitioned to seated BLE strengthening on Kinetron at 20 cm/sec for 1 minute, then 15 cm/sec for 1 minute x 3 additional trials with emphasis on glute/quad strength. Pt required frequent rest/water  breaks throughout session but remains motivated. Stood from [facility] with RW and min A and worked on dynamic standing balance and standing tolerance tossing horseshoes with LUE for 2 minutes and 45 seconds with CGA for balance to fatigue - pt reporting 8.5/10 fatigue and feeling exhausted. Returned to room and pt requested to return to bed - transitioned into supine with max A for BLE  management due to fatigue. Provided pt with heat pack for R shoulder and elevated on pillow for comfort. Concluded session with pt semi-reclined in bed, needs within reach, and bed alarm on with NT checking vitals. 18 minutes  missed of skilled physical therapy due to pain and fatigue.   Therapy Documentation Precautions:  Precautions Precautions: Fall, Back, Shoulder Type of Shoulder Precautions: WBAT R UE Shoulder Interventions: Shoulder sling/immobilizer Precaution Booklet Issued: Yes (comment) Recall of Precautions/Restrictions: Intact Required Braces or Orthoses: Sling Restrictions Weight Bearing Restrictions Per Provider Order: Yes RUE Weight Bearing Per Provider Order: Weight bearing as tolerated RLE Weight Bearing Per Provider Order: Weight bearing as tolerated LLE Weight Bearing Per Provider Order: Weight bearing as tolerated Other Position/Activity Restrictions: Per Dr. Dozier We will allow weightbearing to tolerance given his lower extremity paraplegia and need for his upper extremities  Therapy/Group: Individual Therapy Therisa HERO Zaunegger Therisa Stains PT, DPT 06/04/2024, 6:55 AM

## 2024-06-05 MED ORDER — MAGNESIUM HYDROXIDE 400 MG/5ML PO SUSP
15.0000 mL | Freq: Once | ORAL | Status: AC
  Administered 2024-06-05: 15 mL via ORAL
  Filled 2024-06-05: qty 30

## 2024-06-05 NOTE — Plan of Care (Signed)
°  Problem: SCI BOWEL ELIMINATION Goal: RH STG MANAGE BOWEL WITH ASSISTANCE Description: STG Manage Bowel with supervision Assistance. Outcome: Progressing   Problem: SCI BLADDER ELIMINATION Goal: RH STG MANAGE BLADDER WITH ASSISTANCE Description: STG Manage Bladder With supervision Assistance Outcome: Progressing   Problem: RH SKIN INTEGRITY Goal: RH STG SKIN FREE OF INFECTION/BREAKDOWN Description: Manage skin free from infection with supervision assistance Outcome: Progressing   Problem: RH SAFETY Goal: RH STG ADHERE TO SAFETY PRECAUTIONS W/ASSISTANCE/DEVICE Description: STG Adhere to Safety Precautions With supervision Assistance/Device. Outcome: Progressing   Problem: RH PAIN MANAGEMENT Goal: RH STG PAIN MANAGED AT OR BELOW PT'S PAIN GOAL Description: <4 w/ prns Outcome: Progressing

## 2024-06-05 NOTE — Progress Notes (Signed)
 PROGRESS NOTE   Subjective/Complaints:  Pt doing well again, slept well, pain well managed. LBM 12/11 and pt agreeable to taking MoM. Urinating fine. No other complaints or concerns.    ROS: Per HPI  Pt denies SOB, abd pain, CP, N/V/C/D, and vision changes   Objective:   No results found. Recent Labs    06/03/24 0537  WBC 7.7  HGB 9.7*  HCT 30.9*  PLT PLATELET CLUMPS NOTED ON SMEAR, UNABLE TO ESTIMATE   Recent Labs    06/03/24 0537  NA 135  K 4.0  CL 102  CO2 29  GLUCOSE 108*  BUN 11  CREATININE 0.63  CALCIUM 8.6*    Intake/Output Summary (Last 24 hours) at 06/05/2024 1119 Last data filed at 06/05/2024 1000 Gross per 24 hour  Intake 1060 ml  Output 1200 ml  Net -140 ml        Physical Exam: Vital Signs Blood pressure 120/63, pulse 89, temperature 97.8 F (36.6 C), temperature source Oral, resp. rate 19, height 5' 9 (1.753 m), weight (!) 148.7 kg, SpO2 95%.   General: awake, alert, appropriate, resting comfortably in bed; NAD HENT: conjugate gaze; oropharynx moist CV: regular rate and rhythm; no JVD Pulmonary: CTA B/L; no W/R/R- good air movement GI: soft, NT, (+)BS but hypoactive, now mildly distended Psychiatric: appropriate- but slightly flat Neurological: Ox3 Extremities: RUE in sling  PRIOR EXAMS: Musculoskeletal:     Cervical back: Neck supple.     Comments: LUE 5/5 RUE- at least 3/5 proximally; and 5-/5 distally- limited by pain and surgery of proximal humerus RLE- HF 3-/5; KE/KF 4+/5; DF 4/5 and PF 3-/5 LLE- HF 3+/5; KE/KF 5-/5 and DF/PF 5-/5  Skin:    General: Skin is warm and dry.     Comments: Bandage seen on R proximal arm- c/d/I Didn't assess thoracic incision- due to pt's sleepiness-didn't turn over  Neurological:     Comments: 5-6 beats clonus B/L No hoffman's MAS of 1 in LE's- much better Intact to light touch in all 4 extremities   Assessment/Plan: 1. Functional  deficits which require 3+ hours per day of interdisciplinary therapy in a comprehensive inpatient rehab setting. Physiatrist is providing close team supervision and 24 hour management of active medical problems listed below. Physiatrist and rehab team continue to assess barriers to discharge/monitor patient progress toward functional and medical goals  Care Tool:  Bathing    Body parts bathed by patient: Right arm, Chest, Abdomen, Face, Left arm, Left lower leg, Front perineal area, Buttocks, Right upper leg, Left upper leg, Right lower leg   Body parts bathed by helper: Buttocks     Bathing assist Assist Level: Moderate Assistance - Patient 50 - 74%     Upper Body Dressing/Undressing Upper body dressing   What is the patient wearing?: Pull over shirt    Upper body assist Assist Level: Moderate Assistance - Patient 50 - 74%    Lower Body Dressing/Undressing Lower body dressing      What is the patient wearing?: Pants     Lower body assist Assist for lower body dressing: Moderate Assistance - Patient 50 - 74%     Toileting Toileting  Toileting assist Assist for toileting: Maximal Assistance - Patient 25 - 49%     Transfers Chair/bed transfer  Transfers assist     Chair/bed transfer assist level: Contact Guard/Touching assist     Locomotion Ambulation   Ambulation assist      Assist level: Contact Guard/Touching assist Assistive device: Walker-rolling Max distance: 7ft   Walk 10 feet activity   Assist     Assist level: Contact Guard/Touching assist Assistive device: Walker-rolling   Walk 50 feet activity   Assist Walk 50 feet with 2 turns activity did not occur: Safety/medical concerns (weakness/fatigue/pain)  Assist level: Contact Guard/Touching assist Assistive device: Walker-rolling    Walk 150 feet activity   Assist Walk 150 feet activity did not occur: Safety/medical concerns (weakness/fatigue/pain)         Walk 10 feet on  uneven surface  activity   Assist Walk 10 feet on uneven surfaces activity did not occur: Safety/medical concerns (weakness/fatigue/pain)         Wheelchair     Assist Is the patient using a wheelchair?: Yes Type of Wheelchair: Manual    Wheelchair assist level: Maximal Assistance - Patient 25 - 49% (B LE and L UE) Max wheelchair distance: 10 ft    Wheelchair 50 feet with 2 turns activity    Assist        Assist Level: Dependent - Patient 0%   Wheelchair 150 feet activity     Assist      Assist Level: Dependent - Patient 0%   Blood pressure 120/63, pulse 89, temperature 97.8 F (36.6 C), temperature source Oral, resp. rate 19, height 5' 9 (1.753 m), weight (!) 148.7 kg, SpO2 95%.   Medical Problem List and Plan: 1. 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              -patient may  shower             -ELOS/Goals: ~ 14 days- supervision             Admit to CIR Appropriate for CIR- didn't finish first time due to R humeral pathological fx due to Thyroid  cancer mets  -Con't CIR PT and OT 2.  Antithrombotics: -DVT/anticoagulation:  Mechanical: Sequential compression devices, below knee Bilateral lower extremities Pharmaceutical: Eliquis  5mg  BID             -antiplatelet therapy: N/A   3. Pain Management: Robaxin  750 TID AC/HS, Lyrica  50 mg TID, Methadone  10 mg TID. Flexeril  10mg  TID, lidoderm  patches. Oxycodone  30 mg q3 hours as needed for breakthrough pain. Palliative care to follow.  12/12- slept so well, pain worse this AM- currently getting pain meds- won't increase- please don't increase over weekend  4. Mood/Behavior/Sleep: LCSW to follow for evaluation and support when available.              -antipsychotic agents: n/a   -Trazodone  PRN  5. Neuropsych/cognition: This patient is capable of making decisions on his own behalf.   6. Skin/Wound Care: Routine pressure  relief measures. Monitor incisions   7. Fluids/Electrolytes/Nutrition: Monitor I&O and weight. Follow up labs CBC/CMP, continue vitamins/supplements.    8. 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 Completed course of dexamethasone . Patient to follow up outpatient with ENT, endocrinology and radiation oncology. ENT plan to discuss at multidisciplinary tumor board on 06/08/24.             - Will likely need Thyroidectomy  and RAI-waiting for tumor board  9. Pathological right proximal humerus fracture: s/p IMR 11/29. WBAT in RUE due to needing that- use platform walker.  Ortho follow up in 2 weeks for recheck and updated xrays.  -Patient to remain in sling.   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 Patient underwent instrumentation and fusion on prior admission. Radiation Onc plans 2 weeks of radiotherapy to thoracic spine starts 06/02/24- right humerus on 06/09/24.   11. Hx of PE: dx on 11/6 provoked secondary to metastatic cancer. Continue Eliquis  for 3-6 months.   12. Paraplegia/Neurogenic bladder: Monitor bladder scan with PVR  12/12- will order in/out caths and bladder scans q6 hours- to see if needs cathing -12/13-14/25 no cathing overnight, urinating well   13. Chronic normocytic anemia: Stable, continue Niferex 150 mg daily    14. Constipation/mild neurogenic bowel: Continue Miralax   daily and Senokot S 2 tabs BID -06/05/24 still no BM since 12/11, pt agreeable to MoM 15ml x1   15. Polysubstance use disorder- will have palliative care f/u with meds after d/c for wean     LOS: 3 days A FACE TO FACE EVALUATION WAS PERFORMED  [address] 06/05/2024, 11:19 AM

## 2024-06-05 NOTE — Plan of Care (Signed)
°  Problem: Consults Goal: RH SPINAL CORD INJURY PATIENT EDUCATION Description:  See Patient Education module for education specifics.  Outcome: Progressing   Problem: SCI BOWEL ELIMINATION Goal: RH STG MANAGE BOWEL WITH ASSISTANCE Description: STG Manage Bowel with supervision Assistance. Outcome: Progressing   Problem: SCI BLADDER ELIMINATION Goal: RH STG MANAGE BLADDER WITH ASSISTANCE Description: STG Manage Bladder With supervision Assistance Outcome: Progressing   Problem: RH SKIN INTEGRITY Goal: RH STG SKIN FREE OF INFECTION/BREAKDOWN Description: Manage skin free from infection with supervision assistance Outcome: Progressing   Problem: RH SAFETY Goal: RH STG ADHERE TO SAFETY PRECAUTIONS W/ASSISTANCE/DEVICE Description: STG Adhere to Safety Precautions With supervision Assistance/Device. Outcome: Progressing   Problem: RH PAIN MANAGEMENT Goal: RH STG PAIN MANAGED AT OR BELOW PT'S PAIN GOAL Description: <4 w/ prns Outcome: Progressing   Problem: RH KNOWLEDGE DEFICIT SCI Goal: RH STG INCREASE KNOWLEDGE OF SELF CARE AFTER SCI Outcome: Progressing

## 2024-06-05 NOTE — IPOC Note (Signed)
 Overall Plan of Care [facility]) Patient Details Name: Joseph [name] MRN: [id number] DOB: Mar 13, 1981  Admitting Diagnosis: Acute incomplete paraplegia [facility])  Hospital Problems: Principal Problem:   Acute incomplete paraplegia (HCC) Active Problems:   Spinal cord injury at T1-T6 level [facility])     Functional Problem List: Nursing Bladder, Bowel, Edema, Endurance, Medication Management, Motor, Pain, Safety, Skin Integrity, Sensory  PT Balance, Sensory, Edema, Endurance, Motor, Pain, Safety, Skin Integrity  OT Balance, Endurance, Motor, Pain, Safety  SLP    TR         Basic ADLs: OT Bathing, Dressing, Toileting     Advanced  ADLs: OT None     Transfers: PT Bed Mobility, Bed to Chair, Car, Occupational Psychologist, Research Scientist (life Sciences): PT Ambulation, Psychologist, [employer], Stairs     Additional Impairments: OT None  SLP        TR      Anticipated Outcomes Item Anticipated Outcome  Self Feeding no goal set  Swallowing      Basic self-care  min A- (S)  Toileting  CGA   Bathroom Transfers (S)  Bowel/Bladder  manage bowels with medications/ manage neurogenic bladder with pvrs/ i and o cath if needed  Transfers  supervision/mod I  Locomotion  supervision  Communication     Cognition     Pain  <4 w/ prns  Safety/Judgment  manage safety with supervision assistance   Therapy Plan: PT Intensity: Minimum of 1-2 x/day ,45 to 90 minutes PT Frequency: 5 out of 7 days PT Duration Estimated Length of Stay: 10-12 days OT Intensity: Minimum of 1-2 x/day, 45 to 90 minutes OT Frequency: 5 out of 7 days OT Duration/Estimated Length of Stay: 10-12 days     Team Interventions: Nursing Interventions Patient/Family Education, Medication Management, Bladder Management, Bowel Management, Disease Management/Prevention, Pain Management, Discharge Planning, Skin Care/Wound Management  PT interventions Ambulation/gait training, Cognitive remediation/compensation, Discharge  planning, DME/adaptive equipment instruction, Functional mobility training, Pain management, Psychosocial support, Splinting/orthotics, Therapeutic Activities, UE/LE Strength taining/ROM, Visual/perceptual remediation/compensation, Warden/ranger, Community reintegration, Disease management/prevention, Functional electrical stimulation, Neuromuscular re-education, Patient/family education, Skin care/wound management, Stair training, Therapeutic Exercise, UE/LE Coordination activities, Wheelchair propulsion/positioning  OT Interventions Warden/ranger, Self Care/advanced ADL retraining, UE/LE Coordination activities, Functional mobility training, Visual/perceptual remediation/compensation, Skin care/wound managment, Community reintegration, Neuromuscular re-education, Splinting/orthotics, Wheelchair propulsion/positioning, Discharge planning, Pain management, Therapeutic Activities, Disease mangement/prevention, Patient/family education, Therapeutic Exercise, DME/adaptive equipment instruction, Psychosocial support, UE/LE Strength taining/ROM  SLP Interventions    TR Interventions    SW/CM Interventions Discharge Planning, Psychosocial Support, Patient/Family Education   Barriers to Discharge MD  Medical stability, Home enviroment access/loayout, Neurogenic bowel and bladder, Wound care, Lack of/limited family support, Weight, Weight bearing restrictions, and Pending chemo/radiation  Nursing Decreased caregiver support, Home environment access/layout, Neurogenic Bowel & Bladder, Weight bearing restrictions Discharge: House  Discharge Home Layout: One level  Discharge Home Access: Stairs to enter  Entrance Stairs-Rails: None  Entrance Stairs-Number of Steps: 1  PT Decreased caregiver support, Home environment access/layout, Lack of/limited family support    OT Decreased caregiver support    SLP      SW Decreased caregiver support, Lack of/limited family support, Community Education Officer for  SNF coverage     Team Discharge Planning: Destination: PT-Home (to aunts house) ,OT- Home , SLP-  Projected Follow-up: PT-Home health PT, OT-  Home health OT, SLP-  Projected Equipment Needs: PT-To be determined, OT- To be determined, SLP-  Equipment Details: PT- , OT-  Patient/family  involved in discharge planning: PT- Patient,  OT-Patient, SLP-   MD ELOS: 10-12 days Medical Rehab Prognosis:  Good Assessment: The patient has been admitted for CIR therapies with the diagnosis of 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 The team will be addressing functional mobility, strength, stamina, balance, safety, adaptive techniques and equipment, self-care, bowel and bladder mgt, patient and caregiver education, radiation. Goals have been set at Min A to supervision. Anticipated discharge destination is home with intermittent family support.        See Team Conference Notes for weekly updates to the plan of care

## 2024-06-06 ENCOUNTER — Ambulatory Visit: Payer: MEDICAID | Admitting: Radiation Oncology

## 2024-06-06 ENCOUNTER — Other Ambulatory Visit: Payer: Self-pay

## 2024-06-06 ENCOUNTER — Encounter (HOSPITAL_COMMUNITY): Payer: Self-pay | Admitting: Physical Medicine and Rehabilitation

## 2024-06-06 DIAGNOSIS — Z510 Encounter for antineoplastic radiation therapy: Secondary | ICD-10-CM | POA: Diagnosis not present

## 2024-06-06 LAB — CBC
HCT: 32.8 % — ABNORMAL LOW (ref 39.0–52.0)
Hemoglobin: 10.2 g/dL — ABNORMAL LOW (ref 13.0–17.0)
MCH: 28.7 pg (ref 26.0–34.0)
MCHC: 31.1 g/dL (ref 30.0–36.0)
MCV: 92.4 fL (ref 80.0–100.0)
Platelets: UNDETERMINED K/uL (ref 150–400)
RBC: 3.55 MIL/uL — ABNORMAL LOW (ref 4.22–5.81)
RDW: 14 % (ref 11.5–15.5)
WBC: 5.6 K/uL (ref 4.0–10.5)
nRBC: 0 % (ref 0.0–0.2)

## 2024-06-06 LAB — BASIC METABOLIC PANEL WITH GFR
Anion gap: 8 (ref 5–15)
BUN: 12 mg/dL (ref 6–20)
CO2: 28 mmol/L (ref 22–32)
Calcium: 8.7 mg/dL — ABNORMAL LOW (ref 8.9–10.3)
Chloride: 101 mmol/L (ref 98–111)
Creatinine, Ser: 0.8 mg/dL (ref 0.61–1.24)
GFR, Estimated: >60 mL/min (ref >60)
Glucose, Bld: 110 mg/dL — ABNORMAL HIGH (ref 70–99)
Potassium: 4.3 mmol/L (ref 3.5–5.1)
Sodium: 137 mmol/L (ref 135–145)

## 2024-06-06 LAB — RAD ONC ARIA SESSION SUMMARY
Course Elapsed Days: 4
Plan Fractions Treated to Date: 3
Plan Prescribed Dose Per Fraction: 3 Gy
Plan Total Fractions Prescribed: 10
Plan Total Prescribed Dose: 30 Gy
Reference Point Dosage Given to Date: 9 Gy
Reference Point Session Dosage Given: 3 Gy
Session Number: 3

## 2024-06-06 NOTE — Progress Notes (Signed)
 Occupational Therapy Session Note  Patient Details  Name: Joseph [name] MRN: [id number] Date of Birth: 05/14/1981  Today's Date: 06/06/2024 OT Individual Time: 0800-0900 & 1300-1415 OT Individual Time Calculation (min): 60 min & 75 min   Short Term Goals: Week 1:  OT Short Term Goal 1 (Week 1): Pt will complete sit  > stand with CGA OT Short Term Goal 2 (Week 1): Pt will complete toileting tasks with mod A OT Short Term Goal 3 (Week 1): Pt will complete functional mobility into the bathroom with CGA using the RW OT Short Term Goal 4 (Week 1): Pt will complete LB dressing with LRAD with min A  Skilled Therapeutic Interventions/Progress Updates:      Therapy Documentation Precautions:  Precautions Precautions: Fall, Back, Shoulder Type of Shoulder Precautions: WBAT R UE Shoulder Interventions: Shoulder sling/immobilizer Precaution Booklet Issued: Yes (comment) Recall of Precautions/Restrictions: Intact Required Braces or Orthoses: Sling Restrictions Weight Bearing Restrictions Per Provider Order: Yes RUE Weight Bearing Per Provider Order: Weight bearing as tolerated RLE Weight Bearing Per Provider Order: Weight bearing as tolerated LLE Weight Bearing Per Provider Order: Weight bearing as tolerated Other Position/Activity Restrictions: Per Dr. Dozier We will allow weightbearing to tolerance given his lower extremity paraplegia and need for his upper extremities Session 1 General: Pt supine in bed upon OT arrival, agreeable to OT session.  Pain: 6/10 pain reported in Lt shoulder, activity, intermittent rest breaks, distractions provided for pain management, pt reports tolerable to proceed.   ADL: OT providing skilled intervention on ADL retraining in order to increase independence with tasks and increase activity tolerance. Pt completed the following tasks at the current level of assist: Bed mobility: CGA with increased time from supine with slightly elevated HOB   Grooming/oral hygiene: set up seated in W/C, able to reach out of BOS with LUE in order to retrieve  UB dressing: SBA seated in W/C, pt able to maneuver Rt shoulder into shirt  LB dressing: Min A with use of reacher, assistance with managing over buttocks d/t limited ROM in RUE  Footwear: total A for socks Transfers: pt requiring CGA to stand initially, with fatigue increasing to Min A for sit to stands   Exercises: Pt completed the following exercise circuit in order to improve functional activity, strength and endurance to prepare for ADLs such as bathing. Pt completed the following exercises in seated position with no noted LOB/SOB and 3x8 repetitions on each exercise with seated rest breaks: -toe taps onto cone in standing at RW    Pt seated in W/C at end of session with W/C alarm donned, call light within reach and 4Ps assessed.    Session 2 General: Pt supine in bed upon OT arrival, agreeable to OT session.   Pain: 7/10 pain reported in shoulder, activity, intermittent rest breaks, distractions provided for pain management, pt reports tolerable to proceed.   ADL: OT providing skilled intervention on ADL retraining in order to increase independence with tasks and increase activity tolerance. Pt completed the following tasks at the current level of assist: Bed mobility: SBA and use of bed rails with increased time d/t fatigue  Eating: set up with Lt hand  Transfers: CGA with RW safe transfer techniques   Exercises: Pt completed the following exercise circuit in order to improve functional activity, strength, ROM and endurance or RUE to prepare for ADLs such as bathing and dressing. Pt completed the following exercises in seated position with no noted LOB/SOB and 3x10 repetitions on each  exercise: - forward/backward towel slides -Rt and Lt towel slides -clockwise/counterclockwise towel slides  -forward reaching to place/retrieve items    At end of session pt requesting to get into  bed, Pt requiring Mod A for managing BLE into bed d/t fatigue. Pt supine in bed with bed alarm activated, 2 bed rails up, call light within reach and 4Ps assessed.   Therapy/Group: Individual Therapy  Camie Hoe, OTD, OTR/L 06/06/2024, 3:34 PM

## 2024-06-06 NOTE — Plan of Care (Signed)
°  Problem: Consults Goal: RH SPINAL CORD INJURY PATIENT EDUCATION Description:  See Patient Education module for education specifics.  Outcome: Progressing   Problem: SCI BOWEL ELIMINATION Goal: RH STG MANAGE BOWEL WITH ASSISTANCE Description: STG Manage Bowel with supervision Assistance. Outcome: Progressing   Problem: SCI BLADDER ELIMINATION Goal: RH STG MANAGE BLADDER WITH ASSISTANCE Description: STG Manage Bladder With supervision Assistance Outcome: Progressing   Problem: RH SKIN INTEGRITY Goal: RH STG SKIN FREE OF INFECTION/BREAKDOWN Description: Manage skin free from infection with supervision assistance Outcome: Progressing   Problem: RH SAFETY Goal: RH STG ADHERE TO SAFETY PRECAUTIONS W/ASSISTANCE/DEVICE Description: STG Adhere to Safety Precautions With supervision Assistance/Device. Outcome: Progressing   Problem: RH PAIN MANAGEMENT Goal: RH STG PAIN MANAGED AT OR BELOW PT'S PAIN GOAL Description: <4 w/ prns Outcome: Progressing   Problem: RH KNOWLEDGE DEFICIT SCI Goal: RH STG INCREASE KNOWLEDGE OF SELF CARE AFTER SCI Outcome: Progressing

## 2024-06-06 NOTE — Progress Notes (Signed)
 Patient ID: Joseph [name], male   DOB: 19-Apr-1981, 43 y.o.   MRN: [id number]  1150- SW called pt mother with the purpose to inform on ELOS. Before being unable to share, she stated she would call this SW back. *SW spoke with pt mother to inform on ELOS, and SW will follow up with updates after team conference.  Graeme Jude, MSW, LCSW Office: [phone] Cell: [phone] Fax: [phone]

## 2024-06-06 NOTE — Progress Notes (Signed)
 Physical Therapy Session Note  Patient Details  Name: Joseph [name] MRN: [id number] Date of Birth: [birth year]  Today's Date: 06/06/2024 PT Individual Time: 0945-1100 PT Individual Time Calculation (min): 75 min   Short Term Goals: Week 1:  PT Short Term Goal 1 (Week 1): Pt will perform sit to supine transfers with min A PT Short Term Goal 2 (Week 1): Pt will perform sit <> stand transfers with CGA PT Short Term Goal 3 (Week 1): Pt will perform bed <> WC transfers with CGA  Skilled Therapeutic Interventions/Progress Updates:    Pt seated in w/c on arrival and agreeable to therapy. Pt with unrated shoulder pain on arrival, premedicated. Rest and positioning provided as needed. Requested medication from nsg. Attempted to use urinal in standing, pt unable at this time. Sit to stand with min a to RW. Pt transported to therapy gym for time management and energy conservation.   Pt requesting to view imaging. Discussed location of hardware and healing process after surgery. Pt expressed understanding and demoed improved knowledge of condition.   Pt performed 3 x 10 Sit to stand for strength training. Pt cued to push from chair with LUE and progressed to CGA, cued for increased eccentric recruitment in BLE vs lowering with arm. Pt returned to room and performed ambulatory transfer to bed with min a with RW. Bed mobility with supervision and increased time using bed features. Pt was left with all needs in reach and alarm active.   Therapy Documentation Precautions:  Precautions Precautions: Fall, Back, Shoulder Type of Shoulder Precautions: WBAT R UE Shoulder Interventions: Shoulder sling/immobilizer Precaution Booklet Issued: Yes (comment) Recall of Precautions/Restrictions: Intact Required Braces or Orthoses: Sling Restrictions Weight Bearing Restrictions Per Provider Order: Yes RUE Weight Bearing Per Provider Order: Weight bearing as tolerated RLE Weight Bearing Per Provider Order:  Weight bearing as tolerated LLE Weight Bearing Per Provider Order: Weight bearing as tolerated Other Position/Activity Restrictions: Per Dr. Dozier We will allow weightbearing to tolerance given his lower extremity paraplegia and need for his upper extremities General:       Therapy/Group: Individual Therapy  Phil Corti C Emonee Winkowski 06/06/2024, 10:43 AM

## 2024-06-06 NOTE — Progress Notes (Signed)
 PROGRESS NOTE   Subjective/Complaints:  Pt reports pain is adequately controlled.   But hurting this AM, since just woke up and hasn't had AM pain meds yet.  Ate and had gone back to sleep.  Notes that radiation wipes him out- on day of radiation- but better by the next morning.   LBM yesterday after MOM  ROS: Per HPI   Pt denies SOB, abd pain, CP, N/V/C/D, and vision changes   Objective:   No results found. No results for input(s): WBC, HGB, HCT, PLT in the last 72 hours.  Recent Labs    06/06/24 0559  NA 137  K 4.3  CL 101  CO2 28  GLUCOSE 110*  BUN 12  CREATININE 0.80  CALCIUM 8.7*    Intake/Output Summary (Last 24 hours) at 06/06/2024 1038 Last data filed at 06/06/2024 0848 Gross per 24 hour  Intake 970 ml  Output 1150 ml  Net -180 ml        Physical Exam: Vital Signs Blood pressure 128/69, pulse 96, temperature 98 F (36.7 C), resp. rate 18, height 5' 9 (1.753 m), weight (!) 148.7 kg, SpO2 94%.     General: awake, alert, appropriate, but woke him up; had finished 100% of tray; NAD HENT: conjugate gaze; oropharynx moist CV: regular rate and rhythm, but rate in 90's; no JVD Pulmonary: CTA B/L; no W/R/R- good air movement GI: soft, NT, ND, (+)BS- protuberant Psychiatric: appropriate- interactive,. But quiet Neurological: Ox3  Extremities: RUE not in sling this AM  PRIOR EXAMS: Musculoskeletal:     Cervical back: Neck supple.     Comments: LUE 5/5 RUE- at least 3/5 proximally; and 5-/5 distally- limited by pain and surgery of proximal humerus RLE- HF 3-/5; KE/KF 4+/5; DF 4/5 and PF 3-/5 LLE- HF 3+/5; KE/KF 5-/5 and DF/PF 5-/5  Skin:    General: Skin is warm and dry.     Comments: Bandage seen on R proximal arm- c/d/I Didn't assess thoracic incision- due to pt's sleepiness-didn't turn over  Neurological:     Comments: 5-6 beats clonus B/L No hoffman's MAS of 1 in LE's- much  better Intact to light touch in all 4 extremities   Assessment/Plan: 1. Functional deficits which require 3+ hours per day of interdisciplinary therapy in a comprehensive inpatient rehab setting. Physiatrist is providing close team supervision and 24 hour management of active medical problems listed below. Physiatrist and rehab team continue to assess barriers to discharge/monitor patient progress toward functional and medical goals  Care Tool:  Bathing    Body parts bathed by patient: Right arm, Chest, Abdomen, Face, Left arm, Left lower leg, Front perineal area, Buttocks, Right upper leg, Left upper leg, Right lower leg   Body parts bathed by helper: Buttocks     Bathing assist Assist Level: Moderate Assistance - Patient 50 - 74%     Upper Body Dressing/Undressing Upper body dressing   What is the patient wearing?: Pull over shirt    Upper body assist Assist Level: Moderate Assistance - Patient 50 - 74%    Lower Body Dressing/Undressing Lower body dressing      What is the patient wearing?: Pants  Lower body assist Assist for lower body dressing: Moderate Assistance - Patient 50 - 74%     Toileting Toileting    Toileting assist Assist for toileting: Maximal Assistance - Patient 25 - 49%     Transfers Chair/bed transfer  Transfers assist     Chair/bed transfer assist level: Contact Guard/Touching assist     Locomotion Ambulation   Ambulation assist      Assist level: Contact Guard/Touching assist Assistive device: Walker-rolling Max distance: 41ft   Walk 10 feet activity   Assist     Assist level: Contact Guard/Touching assist Assistive device: Walker-rolling   Walk 50 feet activity   Assist Walk 50 feet with 2 turns activity did not occur: Safety/medical concerns (weakness/fatigue/pain)  Assist level: Contact Guard/Touching assist Assistive device: Walker-rolling    Walk 150 feet activity   Assist Walk 150 feet activity did not  occur: Safety/medical concerns (weakness/fatigue/pain)         Walk 10 feet on uneven surface  activity   Assist Walk 10 feet on uneven surfaces activity did not occur: Safety/medical concerns (weakness/fatigue/pain)         Wheelchair     Assist Is the patient using a wheelchair?: Yes Type of Wheelchair: Manual    Wheelchair assist level: Maximal Assistance - Patient 25 - 49% (B LE and L UE) Max wheelchair distance: 10 ft    Wheelchair 50 feet with 2 turns activity    Assist        Assist Level: Dependent - Patient 0%   Wheelchair 150 feet activity     Assist      Assist Level: Dependent - Patient 0%   Blood pressure 128/69, pulse 96, temperature 98 F (36.7 C), resp. rate 18, height 5' 9 (1.753 m), weight (!) 148.7 kg, SpO2 94%.   Medical Problem List and Plan: 1. 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              -patient may  shower             -ELOS/Goals: ~ 14 days- supervision             Admit to CIR Appropriate for CIR- didn't finish first time due to R humeral pathological fx due to Thyroid  cancer mets  Con't CIR PT and OT Asking about timing of thyroidectomy- explained will know after tumor board later this week 2.  Antithrombotics: -DVT/anticoagulation:  Mechanical: Sequential compression devices, below knee Bilateral lower extremities Pharmaceutical: Eliquis  5mg  BID due to PE 11/10- RUL segmental PE             -antiplatelet therapy: N/A   3. Pain Management: Robaxin  750 TID AC/HS, Lyrica  50 mg TID, Methadone  10 mg TID. Flexeril  10mg  TID, lidoderm  patches. Oxycodone  30 mg q3 hours as needed for breakthrough pain. Palliative care to follow.  12/15- pain adequately controlled- con't regimen 4. Mood/Behavior/Sleep: LCSW to follow for evaluation and support when available.              -antipsychotic agents: n/a   -Trazodone  PRN  5.  Neuropsych/cognition: This patient is capable of making decisions on his own behalf.   6. Skin/Wound Care: Routine pressure relief measures. Monitor incisions   7. Fluids/Electrolytes/Nutrition: Monitor I&O and weight. Follow up labs CBC/CMP, continue vitamins/supplements.    8. 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 Completed course of dexamethasone . Patient to follow up outpatient with ENT, endocrinology and radiation  oncology. ENT plan to discuss at multidisciplinary tumor board on 06/08/24.             - Will likely need Thyroidectomy and RAI-waiting for tumor board  12/15- should occur at tumor board 12/17 that's planned this week- d/w pt this timing of things 9. Pathological right proximal humerus fracture: s/p IMR 11/29. WBAT in RUE due to needing that- use platform walker.  Ortho follow up in 2 weeks for recheck and updated xrays.  -Patient to remain in sling.   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 Patient underwent instrumentation and fusion on prior admission. Radiation Onc plans 2 weeks of radiotherapy to thoracic spine starts 06/02/24- right humerus on 06/09/24.   11. Hx of RUL segmental  PE: dx on 11/6 provoked secondary to metastatic cancer. Continue Eliquis  for 3-6 months.   12. Incomplete Paraplegia/Neurogenic bladder: Monitor bladder scan with PVR  12/12- will order in/out caths and bladder scans q6 hours- to see if needs cathing -12/13-14/25 no cathing overnight, urinating well   13. Chronic normocytic anemia: Stable, continue Niferex 150 mg daily    14. Constipation/mild neurogenic bowel: Continue Miralax   daily and Senokot S 2 tabs BID -06/05/24 still no BM since 12/11, pt agreeable to MoM 15ml x1  12/15- LBM yesterday after MOM  15. Polysubstance use disorder- will have palliative care f/u with meds after d/c for wean    I spent a total of 38   minutes on total care today- >50% coordination of care- due to  Reviewed issues  with pt and nursing- reviewed labs, vitals and B/B- also d/w pt about tumor board and plans.    LOS: 4 days A FACE TO FACE EVALUATION WAS PERFORMED  Joseph [name] 06/06/2024, 10:38 AM

## 2024-06-06 NOTE — Care Management (Signed)
 Inpatient Rehabilitation Center Individual Statement of Services  Patient Name:  Joseph [name]  Date:  06/06/2024  Welcome to the Inpatient Rehabilitation Center.  Our goal is to provide you with an individualized program based on your diagnosis and situation, designed to meet your specific needs.  With this comprehensive rehabilitation program, you will be expected to participate in at least 3 hours of rehabilitation therapies Monday-Friday, with modified therapy programming on the weekends.  Your rehabilitation program will include the following services:  Physical Therapy (PT), Occupational Therapy (OT), Speech Therapy (ST), 24 hour per day rehabilitation nursing, Therapeutic Recreaction (TR), Psychology, Neuropsychology, Care Coordinator, Rehabilitation Medicine, Nutrition Services, Pharmacy Services, and Other  Weekly team conferences will be held on Tuesday to discuss your progress.  Your Inpatient Rehabilitation Care Coordinator will talk with you frequently to get your input and to update you on team discussions.  Team conferences with you and your family in attendance may also be held.  Expected length of stay: 10-12 days    Overall anticipated outcome: Supervision  Depending on your progress and recovery, your program may change. Your Inpatient Rehabilitation Care Coordinator will coordinate services and will keep you informed of any changes. Your Inpatient Rehabilitation Care Coordinator's name and contact numbers are listed  below.  The following services may also be recommended but are not provided by the Inpatient Rehabilitation Center:  Driving Evaluations Home Health Rehabiltiation Services Outpatient Rehabilitation Services Vocational Rehabilitation   Arrangements will be made to provide these services after discharge if needed.  Arrangements include referral to agencies that provide these services.  Your insurance has been verified to be:  [employer]  Your primary doctor is:  No PCP  Pertinent information will be shared with your doctor and your insurance company.  Inpatient Rehabilitation Care Coordinator:  Graeme Jude, KEN [phone] or ([phone]  Information discussed with and copy given to patient by: [name], 06/06/2024, 11:49 AM

## 2024-06-06 NOTE — Progress Notes (Signed)
 Met with patient to review current situation, team conference and plan of care. Reviewed medications, pain, radiation therapies. Patient needs PCP will follow up. Continue to follow along to provide educational needs to facilitate preparation for discharge.

## 2024-06-07 ENCOUNTER — Inpatient Hospital Stay (HOSPITAL_COMMUNITY): Payer: MEDICAID

## 2024-06-07 ENCOUNTER — Ambulatory Visit: Payer: MEDICAID

## 2024-06-07 ENCOUNTER — Other Ambulatory Visit: Payer: Self-pay

## 2024-06-07 DIAGNOSIS — F4329 Adjustment disorder with other symptoms: Secondary | ICD-10-CM

## 2024-06-07 DIAGNOSIS — Z510 Encounter for antineoplastic radiation therapy: Secondary | ICD-10-CM | POA: Diagnosis not present

## 2024-06-07 LAB — RAD ONC ARIA SESSION SUMMARY
Course Elapsed Days: 5
Plan Fractions Treated to Date: 4
Plan Prescribed Dose Per Fraction: 3 Gy
Plan Total Fractions Prescribed: 10
Plan Total Prescribed Dose: 30 Gy
Reference Point Dosage Given to Date: 12 Gy
Reference Point Session Dosage Given: 3 Gy
Session Number: 4

## 2024-06-07 MED ORDER — METHADONE HCL 5 MG PO TABS: 12.5000 mg | ORAL_TABLET | Freq: Three times a day (TID) | ORAL | Status: DC

## 2024-06-07 MED ORDER — METHADONE HCL 5 MG PO TABS
12.5000 mg | ORAL_TABLET | Freq: Three times a day (TID) | ORAL | Status: DC
  Administered 2024-06-07 – 2024-06-10 (×9): 12.5 mg via ORAL
  Filled 2024-06-07 (×9): qty 3

## 2024-06-07 NOTE — Plan of Care (Signed)
 Goals upgraded d/t progress Problem: RH Dressing Goal: LTG Patient will perform upper body dressing (OT) Description: LTG Patient will perform upper body dressing with assist, with/without cues (OT). Flowsheets (Taken 06/07/2024 0755) LTG: Pt will perform upper body dressing with assistance level of: Independent with assistive device Goal: LTG Patient will perform lower body dressing w/assist (OT) Description: LTG: Patient will perform lower body dressing with assist, with/without cues in positioning using equipment (OT) Flowsheets (Taken 06/07/2024 0755) LTG: Pt will perform lower body dressing with assistance level of: Independent with assistive device   Problem: RH Toileting Goal: LTG Patient will perform toileting task (3/3 steps) with assistance level (OT) Description: LTG: Patient will perform toileting task (3/3 steps) with assistance level (OT)  Flowsheets (Taken 06/07/2024 0755) LTG: Pt will perform toileting task (3/3 steps) with assistance level: Independent with assistive device   Problem: RH Toilet Transfers Goal: LTG Patient will perform toilet transfers w/assist (OT) Description: LTG: Patient will perform toilet transfers with assist, with/without cues using equipment (OT) Flowsheets (Taken 06/07/2024 0755) LTG: Pt will perform toilet transfers with assistance level of: Independent with assistive device   Problem: RH Tub/Shower Transfers Goal: LTG Patient will perform tub/shower transfers w/assist (OT) Description: LTG: Patient will perform tub/shower transfers with assist, with/without cues using equipment (OT) Flowsheets (Taken 06/07/2024 0755) LTG: Pt will perform tub/shower stall transfers with assistance level of: Independent with assistive device

## 2024-06-07 NOTE — Progress Notes (Signed)
 Occupational Therapy Session Note  Patient Details  Name: Joseph [name] MRN: [id number] Date of Birth: 09-28-1980  Today's Date: 06/07/2024 OT Individual Time: 1000-1100 OT Individual Time Calculation (min): 60 min    Short Term Goals: Week 1:  OT Short Term Goal 1 (Week 1): Pt will complete sit  > stand with CGA OT Short Term Goal 2 (Week 1): Pt will complete toileting tasks with mod A OT Short Term Goal 3 (Week 1): Pt will complete functional mobility into the bathroom with CGA using the RW OT Short Term Goal 4 (Week 1): Pt will complete LB dressing with LRAD with min A  Skilled Therapeutic Interventions/Progress Updates:      Therapy Documentation Precautions:  Precautions Precautions: Fall, Back, Shoulder Type of Shoulder Precautions: WBAT R UE Shoulder Interventions: Shoulder sling/immobilizer Precaution Booklet Issued: Yes (comment) Recall of Precautions/Restrictions: Intact Required Braces or Orthoses: Sling Restrictions Weight Bearing Restrictions Per Provider Order: Yes RUE Weight Bearing Per Provider Order: Weight bearing as tolerated RLE Weight Bearing Per Provider Order: Weight bearing as tolerated LLE Weight Bearing Per Provider Order: Weight bearing as tolerated Other Position/Activity Restrictions: Per Dr. Dozier We will allow weightbearing to tolerance given his lower extremity paraplegia and need for his upper extremities General: Pt seated in W/C upon OT arrival, agreeable to OT.  Pain: 5/10 pain reported in Rt shoulder, activity, intermittent rest breaks, distractions provided for pain management, pt reports tolerable to proceed.   ADL: OT providing skilled intervention for ADL/IADL tasks in ADL suite. OT instructing patient on finding items throughout cabinets, fridge, and pantry at various heights and reaching out of BOS up/down within precautions. Pt able to complete task at [facility] with VC for technique when completing ADL tasks and correct angles to  take when opening fridge/cabinets. OT educating patient on kitchen safety while patient completing task. Pt also able to complete furniture transfers at [facility] with use of arm rest from couch on Lt side.   Other Treatments: OT introducing leg lifter for increased independence with managing legs into bed. Pt unable to trial d/t time constraints.    Pt seated in W/C at end of session with W/C alarm donned, call light within reach and 4Ps assessed.    Therapy/Group: Individual Therapy  Camie Hoe, OTD, OTR/L 06/07/2024, 12:58 PM

## 2024-06-07 NOTE — Progress Notes (Signed)
 RN removed staples from upper RUE and shoulder per MD order. No bleeding. Incisions cleansed and OTA

## 2024-06-07 NOTE — Progress Notes (Signed)
 PROGRESS NOTE   Subjective/Complaints:  Pt reports pain about the same- usually controlled unless sleeps through dosing at night- taking the Oxycodone  30 mg ~q 3-4 hours on average.   Will call Palliative care to help continue to titrate/manage pain meds.    No complaints LBM yesterday? But chart shows 12/14   ROS: Per HPI   Pt denies SOB, abd pain, CP, N/V/C/D, and vision changes    Objective:   No results found. Recent Labs    06/06/24 0559  WBC 5.6  HGB 10.2*  HCT 32.8*  PLT PLATELET CLUMPS NOTED ON SMEAR, UNABLE TO ESTIMATE    Recent Labs    06/06/24 0559  NA 137  K 4.3  CL 101  CO2 28  GLUCOSE 110*  BUN 12  CREATININE 0.80  CALCIUM 8.7*    Intake/Output Summary (Last 24 hours) at 06/07/2024 0845 Last data filed at 06/07/2024 0820 Gross per 24 hour  Intake 960 ml  Output 1375 ml  Net -415 ml        Physical Exam: Vital Signs Blood pressure 108/68, pulse 93, temperature 98.7 F (37.1 C), temperature source Oral, resp. rate 16, height 5' 9 (1.753 m), weight (!) 148.7 kg, SpO2 92%.      General: awake, alert, appropriate, seen in dayroom with OT; NAD HENT: conjugate gaze; oropharynx moist CV: regular rate and rhythm- rate in 90's; no JVD Pulmonary: CTA B/L; no W/R/R- good air movement GI: soft, NT, ND, (+)BS- protuberant Psychiatric: appropriate Neurological: Ox3   Extremities: RUE not in sling this AM  PRIOR EXAMS: Musculoskeletal:     Cervical back: Neck supple.     Comments: LUE 5/5 RUE- at least 3/5 proximally; and 5-/5 distally- limited by pain and surgery of proximal humerus RLE- HF 3-/5; KE/KF 4+/5; DF 4/5 and PF 3-/5 LLE- HF 3+/5; KE/KF 5-/5 and DF/PF 5-/5  Skin:    General: Skin is warm and dry.     Comments: Bandage seen on R proximal arm- c/d/I Didn't assess thoracic incision- due to pt's sleepiness-didn't turn over  Neurological:     Comments: 5-6 beats clonus  B/L No hoffman's MAS of 1 in LE's- much better Intact to light touch in all 4 extremities   Assessment/Plan: 1. Functional deficits which require 3+ hours per day of interdisciplinary therapy in a comprehensive inpatient rehab setting. Physiatrist is providing close team supervision and 24 hour management of active medical problems listed below. Physiatrist and rehab team continue to assess barriers to discharge/monitor patient progress toward functional and medical goals  Care Tool:  Bathing    Body parts bathed by patient: Right arm, Chest, Abdomen, Front perineal area, Right upper leg, Left upper leg, Right lower leg, Left lower leg, Face   Body parts bathed by helper: Left arm     Bathing assist Assist Level: Minimal Assistance - Patient > 75%     Upper Body Dressing/Undressing Upper body dressing   What is the patient wearing?: Pull over shirt    Upper body assist Assist Level: Supervision/Verbal cueing    Lower Body Dressing/Undressing Lower body dressing      What is the patient wearing?: Pants  Lower body assist Assist for lower body dressing: Minimal Assistance - Patient > 75%     Toileting Toileting    Toileting assist Assist for toileting: Moderate Assistance - Patient 50 - 74%     Transfers Chair/bed transfer  Transfers assist     Chair/bed transfer assist level: Contact Guard/Touching assist     Locomotion Ambulation   Ambulation assist      Assist level: Contact Guard/Touching assist Assistive device: Walker-rolling Max distance: 43ft   Walk 10 feet activity   Assist     Assist level: Contact Guard/Touching assist Assistive device: Walker-rolling   Walk 50 feet activity   Assist Walk 50 feet with 2 turns activity did not occur: Safety/medical concerns (weakness/fatigue/pain)  Assist level: Contact Guard/Touching assist Assistive device: Walker-rolling    Walk 150 feet activity   Assist Walk 150 feet activity did  not occur: Safety/medical concerns (weakness/fatigue/pain)         Walk 10 feet on uneven surface  activity   Assist Walk 10 feet on uneven surfaces activity did not occur: Safety/medical concerns (weakness/fatigue/pain)         Wheelchair     Assist Is the patient using a wheelchair?: Yes Type of Wheelchair: Manual    Wheelchair assist level: Maximal Assistance - Patient 25 - 49% (B LE and L UE) Max wheelchair distance: 10 ft    Wheelchair 50 feet with 2 turns activity    Assist        Assist Level: Total Assistance - Patient < 25%   Wheelchair 150 feet activity     Assist      Assist Level: Total Assistance - Patient < 25%   Blood pressure 108/68, pulse 93, temperature 98.7 F (37.1 C), temperature source Oral, resp. rate 16, height 5' 9 (1.753 m), weight (!) 148.7 kg, SpO2 92%.   Medical Problem List and Plan: 1. 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              -patient may  shower             -ELOS/Goals: ~ 14 days- supervision             Admit to CIR Appropriate for CIR- didn't finish first time due to R humeral pathological fx due to Thyroid  cancer mets  Con't CIR PT and OT Asking about timing of thyroidectomy- explained will know after tumor board  Team conference today to determine LOS- needs to be basically mod I with occ help at home 2.  Antithrombotics: -DVT/anticoagulation:  Mechanical: Sequential compression devices, below knee Bilateral lower extremities Pharmaceutical: Eliquis  5mg  BID due to PE 11/10- RUL segmental PE             -antiplatelet therapy: N/A   3. Pain Management: Robaxin  750 TID AC/HS, Lyrica  50 mg TID, Methadone  10 mg TID. Flexeril  10mg  TID, lidoderm  patches. Oxycodone  30 mg q3 hours as needed for breakthrough pain. Palliative care to follow.  12/16- Called Palliative care- will see if can increase Methadone  with goal to reduce  Oxycodone  - see if can move Oxy to q4 hours? con't regimen for now, but called and placed consult 4. Mood/Behavior/Sleep: LCSW to follow for evaluation and support when available.              -antipsychotic agents: n/a   -Trazodone  PRN  5. Neuropsych/cognition: This patient is capable of making decisions on his own behalf.  6. Skin/Wound Care: Routine pressure relief measures. Monitor incisions   7. Fluids/Electrolytes/Nutrition: Monitor I&O and weight. Follow up labs CBC/CMP, continue vitamins/supplements.    8. 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 Completed course of dexamethasone . Patient to follow up outpatient with ENT, endocrinology and radiation oncology. ENT plan to discuss at multidisciplinary tumor board on 06/08/24.             - Will likely need Thyroidectomy and RAI-waiting for tumor board  12/16- should occur at tumor board 12/17 that's planned this week- d/w pt this topic again and waiting to hear from Oncology  9. Pathological right proximal humerus fracture: s/p IMR 11/29. WBAT in RUE due to needing it- use platform walker.  Ortho follow up in 2 weeks for recheck and updated xrays.  -Patient to remain in sling.   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 Patient underwent instrumentation and fusion on prior admission. Radiation Onc plans 2 weeks of radiotherapy to thoracic spine starts 06/02/24- right humerus on 06/09/24.   11. Hx of RUL segmental  PE: dx on 11/6 provoked secondary to metastatic cancer. Continue Eliquis  for 3-6 months.   12. Incomplete Paraplegia/Neurogenic bladder: Monitor bladder scan with PVR  12/12- will order in/out caths and bladder scans q6 hours- to see if needs cathing -12/13-14/25 no cathing overnight, urinating well   12/16- PVR's 4 to 126 cc in last 24 hours 13. Chronic normocytic anemia: Stable, continue Niferex 150 mg daily    14. Constipation/mild neurogenic bowel: Continue Miralax   daily and  Senokot S 2 tabs BID -06/05/24 still no BM since 12/11, pt agreeable to MoM 15ml x1  12/15- LBM yesterday after MOM   15. Polysubstance use disorder- will have palliative care f/u with meds after d/c for wean   12/16- called palliative care to help with this   I spent a total of 52   minutes on total care today- >50% coordination of care- due to  D/w palliative care- team conference today- d/w team Midlevel and nursing as well as OT- and then also did team conference     LOS: 5 days A FACE TO FACE EVALUATION WAS PERFORMED  Roberta Kelly 06/07/2024, 8:45 AM

## 2024-06-07 NOTE — Progress Notes (Signed)
 Physical Therapy Note  Patient Details  Name: HILARY PUNDT MRN: [id number] Date of Birth: 03-Dec-1980 Today's Date: 06/07/2024    Physical Therapist participated in the interdisciplinary team conference, providing clinical information regarding the patient's current status, treatment goals, and weekly focus, including any barriers that need to be addressed. Please see the Inpatient Rehabilitation Team Conference and Plan of Care Update for further details.    [name] 06/07/2024, 11:25 AM

## 2024-06-07 NOTE — Progress Notes (Signed)
 Physical Therapy Session Note  Patient Details  Name: Joseph [name] MRN: [id number] Date of Birth: Sep 18, 1980  Today's Date: 06/07/2024 PT Individual Time: 9099-9054, 8864-8796 PT Individual Time Calculation (min): 45 min, 28 min   Short Term Goals: Week 1:  PT Short Term Goal 1 (Week 1): Pt will perform sit to supine transfers with min A PT Short Term Goal 2 (Week 1): Pt will perform sit <> stand transfers with CGA PT Short Term Goal 3 (Week 1): Pt will perform bed <> WC transfers with CGA  Skilled Therapeutic Interventions/Progress Updates:    Session 1: Pt seated in w/c on arrival and agreeable to therapy. Pt reports pain controlled with medication this am. Pt propelled w/c throughout session for UE strength and endurance. Pt ambulated x 114 ft with RW and CGA and close w/c follow for safety. Pt was limited by fatigue. Pt then performed 3 x 5 Sit to stand with UE support on knees only. Cued pt to self direct rest breaks for fatigue management. Pt then ambulated ~80 ft in the same manner before becoming limited by fatigue. Pt propelled w/c remaining distance and then remained in w/c with needs in reach at end of session.   Session 2: Pt seated in w/c on arrival and agreeable to therapy. Pt reports unrated pain, requested pain medication from nsg during session. Pt propelled w.c with BUE >100 ft, note drifting to R side, but pt able to compensate.   Pt ambulated x ~80 ft with RW and CGA+w/c follow for safety. Pt fatigued this session d/t multiple previous sessions.   Pt used nustep x 5 min at level 7 for global strength and conditioning. Stand pivot transfer with CGA and RW x 2. Pt returned to room performed ambulatory transfer to bed in same manner, supervision bed mobility, was left with all needs in reach and alarm active.   Therapy Documentation Precautions:  Precautions Precautions: Fall, Back, Shoulder Type of Shoulder Precautions: WBAT R UE Shoulder Interventions: Shoulder  sling/immobilizer Precaution Booklet Issued: Yes (comment) Recall of Precautions/Restrictions: Intact Required Braces or Orthoses: Sling Restrictions Weight Bearing Restrictions Per Provider Order: (P) Yes RUE Weight Bearing Per Provider Order: (P) Weight bearing as tolerated RLE Weight Bearing Per Provider Order: (P) Weight bearing as tolerated LLE Weight Bearing Per Provider Order: (P) Weight bearing as tolerated Other Position/Activity Restrictions: Per Dr. Dozier We will allow weightbearing to tolerance given his lower extremity paraplegia and need for his upper extremities General:       Therapy/Group: Individual Therapy  Joseph [name] 06/07/2024, 9:47 AM

## 2024-06-07 NOTE — Patient Care Conference (Signed)
 Inpatient RehabilitationTeam Conference and Plan of Care Update Date: 06/07/2024   Time: 1114 am    Patient Name: Joseph [name]      Medical Record Number: [id number]  Date of Birth: 05/15/1981 Sex: Male         Room/Bed: 4W08C/4W08C-01 Payor Info: Payor: [employer] / Plan: [employer] / Product Type: *No Product type* /    Admit Date/Time:  06/02/2024  5:51 PM  Primary Diagnosis:  Acute incomplete paraplegia [facility])  Hospital Problems: Principal Problem:   Acute incomplete paraplegia (HCC) Active Problems:   Spinal cord injury at T1-T6 level [facility])   Stress and adjustment reaction    Expected Discharge Date: Expected Discharge Date: 06/21/24  Team Members Present: Physician leading conference: Dr. Duwaine Barrs Social Worker Present: Graeme Jude, LCSW Nurse Present: Eulalio Falls, RN PT Present: Schuyler Batter, PT OT Present: Camie Hoe, OT PPS Coordinator present : Eleanor Colon, SLP     Current Status/Progress Goal Weekly Team Focus  Bowel/Bladder   Patient is continent of bowels and bladder. He is voiding sponteneously with the use of a urinal and patient is assisted to the bathroom whenever needed. Last bowel movment was 06/06/2024, patient o stool softerners to help with bowel mortility due to patient reduced mobility.   Patient has regular bowel movement and voiding patterns   Patient does not get constipated or develop urine retention and/or urinary symptoms    Swallow/Nutrition/ Hydration               ADL's   CGA with RW for tranfers, Min A LB dressing and toileting, Min A bathing, SBA UB ADLs, working on gentle shoulder mobility   Mod I overall-SBA bathing   LB ADL retraining, pain management, increased shoulder ROM for ADLs    Mobility   CGA STS at best. gait up to 85 ft   mod I transfers, supervision gait up to 120 ft, CGA stairs  transfer training, family ed, gait training.    Communication                 Safety/Cognition/ Behavioral Observations               Pain   Patient is in constant pain, requiring regular PRN pain medication administration. He has not verbalized his pain getting controlled though he is sleeping most of the time.   Optimal pain control towards patient stated goal. Might benefit from chronic pain management clinic and palliative care recommendations.   Optimal pain control    Skin   Patient skin is intact apart fron incisions to his right shoulder that are clean, dry and intact with foam dressing.   Patient current incision wounds heal withouth complications and he does not develop pressure associated skin issues.  Incision care as needed, encourage regular turning and shifting of weight.      Discharge Planning:  Pt will d/c to home to his aunt's home and she can only provide supervision as she is currently in chemo treatment. He will have intermittent supervision as there is no one that can be there during the day due to work schedule. Family can bring him a meal if needed, but no more than that during the day. SW explained limitations of options due to insurance. SW will submit PCS referral to insurance once d/c date is set. SW will explore CAP/DA and see if this service is an option. SW will confirm there are no barriers to discharge.  Team Discussion: Patient was admitted post pathological T2 fx with myelopathy d/t metastatic thyroid  cancer.Patient post T1-3 laminectomy. Patient T2 incomplete paraplegia- ASIA D .  Patient with severe pain right upper arm: medications adjusted by MD and palliative care helping with pain management. Patient progress limited by fatigue, limited right shoulder ROM.   Patient on target to meet rehab goals: yes,  currently patient needs SBA with upper body care and min assistance with lower body care.Patient needs CGA with transfers using a rolling walker. Patient was able to ambulate up to 114' CGA using a rolling walker. Overall  goals at discharge are set for mod I assistance.   *See Care Plan and progress notes for long and short-term goals.   Revisions to Treatment Plan:  Upgraded goals  Palliative consult for pain PCS referral  Teaching Needs: Safety, medications, transfers, toileting, Weight bearing precautions, etc   Current Barriers to Discharge: Decreased caregiver support, Home enviroment access/layout, Weight, and Weight bearing restrictions, Radiation therapy  Possible Resolutions to Barriers: Family Education Home health follow up DME: wheelchair    Medical Summary Current Status: pt with thyroid  CA s/p mets-  and paraplegia as a result-tumor board metting 12/17- to determine the plan  Barriers to Discharge: Uncontrolled Pain;Morbid Obesity;Weight bearing restrictions;Self-care education;Behavior/Mood;Pending chemo/radiation;Pending surgery/plan;Medical stability  Barriers to Discharge Comments: doesn't use RUE at all- is WBAT per surgery, but be careful- wbAT due to paraplegia- morbid obesity- need w/c-  weight 327 lbs- will need heavy duty w/c; inontrolle dpain; severe fatigue Possible Resolutions to [employer] Focus: tumor board- - called palliative care for pain mgmt- and pending surgery- and chemo/doing radiation- d/c 12/30   Continued Need for Acute Rehabilitation Level of Care: The patient requires daily medical management by a physician with specialized training in physical medicine and rehabilitation for the following reasons: Direction of a multidisciplinary physical rehabilitation program to maximize functional independence : Yes Medical management of patient stability for increased activity during participation in an intensive rehabilitation regime.: Yes Analysis of laboratory values and/or radiology reports with any subsequent need for medication adjustment and/or medical intervention. : Yes   I attest that I was present, lead the team conference, and concur with the assessment  and plan of the team.   Loucille Takach Gayo 06/07/2024, 1114 am

## 2024-06-07 NOTE — Progress Notes (Signed)
 Occupational Therapy Note  Patient Details  Name: [name] MRN: [id number] Date of Birth: [birth year]  Occupational Therapist participated in the interdisciplinary team conference, providing clinical information regarding the patients current status, treatment goals, and weekly focus, including any barriers that need to be addressed. Please see the Inpatient Rehabilitation Team Conference and Plan of Care Update for further details.   Camie Hoe, OTD, OTR/L 06/07/2024, 11:23 AM

## 2024-06-07 NOTE — Progress Notes (Signed)
 Occupational Therapy Session Note  Patient Details  Name: Joseph [name] MRN: [id number] Date of Birth: [birth year]  Today's Date: 06/07/2024 OT Individual Time: 0700-0810 OT Individual Time Calculation (min): 70 min    Short Term Goals: Week 1:  OT Short Term Goal 1 (Week 1): Pt will complete sit  > stand with CGA OT Short Term Goal 2 (Week 1): Pt will complete toileting tasks with mod A OT Short Term Goal 3 (Week 1): Pt will complete functional mobility into the bathroom with CGA using the RW OT Short Term Goal 4 (Week 1): Pt will complete LB dressing with LRAD with min A  Skilled Therapeutic Interventions/Progress Updates:    Skilled OT intervention with focus on bed mobility, sit<>stand, standing balance, bathing/dressing with sit<>stand at sink, and RUE AROM to increase independence with BADLs. Supine>sit EOB with supervision. Pt transferred to w/c with CGA. Pt required assistance bathing LUE and pulling pants over buttocks on Rt side. Pt required assistance on bathing feet without use of AE. Sit<>stand and standing balance at sink with close supervision. UB dressing with supervision. LB dressing with min A using reacher. Dependent for donning socks. Supported RUE shoulder ROM on table to maintain joint health and mobility. Pt returned to room and remained in w/c with all needs within reach.   Therapy Documentation Precautions:  Precautions Precautions: Fall, Back, Shoulder Type of Shoulder Precautions: WBAT R UE Shoulder Interventions: Shoulder sling/immobilizer Precaution Booklet Issued: Yes (comment) Recall of Precautions/Restrictions: Intact Required Braces or Orthoses: Sling Restrictions Weight Bearing Restrictions Per Provider Order: Yes RUE Weight Bearing Per Provider Order: Weight bearing as tolerated RLE Weight Bearing Per Provider Order: Weight bearing as tolerated LLE Weight Bearing Per Provider Order: Weight bearing as tolerated Other Position/Activity  Restrictions: Per Dr. Dozier We will allow weightbearing to tolerance given his lower extremity paraplegia and need for his upper extremities    Pain: Pt reports minimal pain in Rt shoulder when at rest. Pain escalates when seated and not supported in sling. Rt shoulder supported when not in use for tasks or amb.  Therapy/Group: Individual Therapy  [name] 06/07/2024, 8:13 AM

## 2024-06-07 NOTE — Progress Notes (Signed)
 Patient ID: Joseph [name], male   DOB: August 22, 1980, 43 y.o.   MRN: [id number]  SW left message for [place] [facility] about CAP/DA referral.   SW met with pt in room to provide updates from team conference, d/c date 12/30, and will order w/c. NO HHA preference.   1414- SW spoke with pt mother Joseph [name] to discuss above. Fam edu on Friday 12/26 1pm-4pm. SW shard about challenges with obtaining HH but will wokr on trying to find an agency. She was informed palliative care will follow patient for his methadone  (pain control).   SW sent out HHPT/OT/aide referral to various HHAs. SW waiting on follow-up.    Graeme Jude, MSW, LCSW Office: [phone] Cell: [phone] Fax: [phone]

## 2024-06-07 NOTE — Plan of Care (Signed)
°  Problem: Consults Goal: RH SPINAL CORD INJURY PATIENT EDUCATION Description:  See Patient Education module for education specifics.  Outcome: Progressing   Problem: SCI BOWEL ELIMINATION Goal: RH STG MANAGE BOWEL WITH ASSISTANCE Description: STG Manage Bowel with supervision Assistance. Outcome: Progressing   Problem: SCI BLADDER ELIMINATION Goal: RH STG MANAGE BLADDER WITH ASSISTANCE Description: STG Manage Bladder With supervision Assistance Outcome: Progressing   Problem: RH SKIN INTEGRITY Goal: RH STG SKIN FREE OF INFECTION/BREAKDOWN Description: Manage skin free from infection with supervision assistance Outcome: Progressing   Problem: RH SAFETY Goal: RH STG ADHERE TO SAFETY PRECAUTIONS W/ASSISTANCE/DEVICE Description: STG Adhere to Safety Precautions With supervision Assistance/Device. Outcome: Progressing   Problem: RH PAIN MANAGEMENT Goal: RH STG PAIN MANAGED AT OR BELOW PT'S PAIN GOAL Description: <4 w/ prns Outcome: Progressing   Problem: RH KNOWLEDGE DEFICIT SCI Goal: RH STG INCREASE KNOWLEDGE OF SELF CARE AFTER SCI Outcome: Progressing

## 2024-06-07 NOTE — Consult Note (Signed)
 Neuropsychological Consultation Comprehensive Inpatient Rehab   Patient:   Joseph [name]   DOB:   1980/10/19  MR Number:  [id number]  Location:  [facility] [address] CENTER A [address] [place] [address] Dept: [phone] Loc: [phone]           Date of Service:   06/07/2024  Start Time:   1 PM End Time:   2 PM  Provider/Observer:  Norleen Asa, Psy.D.       Clinical Neuropsychologist       Billing Code/Service: [phone]  Reason for Service:    EDENILSON AUSTAD is a 43 year old male with a history of metastatic papillary thyroid  carcinoma, status post-T1-3 posterior instrumentation and fusion, and right humerus intramedullary nailing. He is currently receiving palliative radiation therapy and is admitted for a comprehensive rehabilitation program due to decreased functional mobility. This initial assessment was conducted to evaluate his cognitive and emotional status.  Presenting Concerns: The patient reports significant fatigue following radiation therapy sessions, which he describes as wiping me out. He also reports shakiness in his knees, particularly the right, which he feels is weaker than the left and has a tendency to buckle. He expresses some anxiety and fear of falling, particularly when transferring to the radiation table, but reports trusting the therapy staff.  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 He also has a large tumor with a pathological fracture of the proximal right humerus, status post-intramedullary nailing. He reports a history of 11 surgeries. - Psychiatric: History of anxiety and depression. History of polysubstance use disorder. - Medical: History of asthma. He is currently on Eliquis  for a right upper lobe segmental  pulmonary embolism. He is receiving palliative radiation therapy to the spine and right humerus, which began on 06/02/2024. He is followed by palliative care for pain and symptom management.  Functional Capacity: Prior to his current hospitalization, the patient was independent and driving. He now requires minimal assistance with mobility and basic activities of daily living. He is participating in physical and occupational therapy to improve mobility, with a goal of being able to perform 75% of tasks independently with 25% assistance. He is using a wheelchair and a walker for mobility.  Mental State Examination: - Appearance and Behaviour: Appeared well-groomed and was cooperative throughout the interview. Maintained good eye contact. No motor restlessness or agitation was observed. - Speech: Rate and volume were within normal limits. Speech was spontaneous and coherent. - Mood and Affect: Reported feeling all right and is trying to maintain a positive outlook. Affect was appropriate to the content of the conversation, though he expressed some frustration and fear related to his physical limitations and medical procedures. - Thought Process/Content: Thought process was logical and goal-directed. No evidence of disorganization or psychosis. He demonstrated good insight into his medical situation and the rationale for his treatments. - Cognition: Alert and oriented. Attention span appeared adequate for the interview. Recent and remote memory appear intact. Verbal fluency was within normal limits. - Insight and Judgement: Demonstrates good insight into his medical condition and the need for rehabilitation. Judgement appears intact.  Clinical Impressions: The patient presents with significant medical complexity, including metastatic cancer and recent major surgeries, which are impacting his physical and emotional functioning. He is experiencing significant fatigue and some anxiety, particularly related  to mobility and fear of  falling. He demonstrates good insight and motivation for rehabilitation. His mood appears euthymic, though he acknowledges the difficulty of his situation. Patient denied significant worsening in mood state and denies that mood state interfering with therapy efforts.  Patient is on no current psychotropic medications outside of the addition of trazodone  to aid with onset of sleep.  Recommendations and Next Steps: - Continue to monitor mood and emotional state, particularly for signs of significant distress that could impact participation in therapies. - Encourage open communication with the care team regarding any emotional or physical difficulties. - Continue with current physical and occupational therapy regimen. - A follow-up assessment will likely occur at the end of this week or the beginning of next week to continue monitoring.      Electronically Signed   _______________________ Norleen Asa, Psy.D. Clinical Neuropsychologist

## 2024-06-07 NOTE — Progress Notes (Signed)
 2 views of the right humerus were obtained today and the fracture site is stable. Patient can use the sling for comfort and can be weight bearing as tolerated RUE. General ROM of the right shoulder is ok. Patient transported to [facility] for cancer treatment when ortho came to remove staples so RN will remove when he returns to his room. Follow up in 3 weeks with Dr Dozier.    Slyvia Lartigue [place] PA-C

## 2024-06-07 NOTE — Consult Note (Signed)
 Consultation Note Date: 06/07/2024   Patient Name: Joseph [name]  DOB: Nov 10, 1980  MRN: [id number]  Age / Sex: 43 y.o., male  PCP: Patient, No Pcp Per Referring Physician: [name], MD  Reason for Consultation: Establishing goals of care  HPI/Patient Profile: 43 y.o. 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   Patient was admitted 11/5-11/24 for post-op management of posterior T1-3 instrumentation fusion T2 corpectomy for resection of tumor, acute PE, thyroid  biopsy confirming papillary carcinoma, acute blood loss anemia due to procedure. He was then in CIR from 11/24-11/28, though participation in therapy was limited due to an acute fracture/metastasis. Patient was seen by PMT for pain control during readmission 11/29-12/11 for pathological fracture of right humerus. He has been in CIR since then with upcoming tumor board to discuss thyroidectomy.   PMT has been consulted to assist with acute cancer-related pain management.  Clinical Assessment and Goals of Care:  I have reviewed medical records including EPIC notes, labs and imaging, assessed the patient and then met at the bedside to discuss goals and pain management options.  I introduced Palliative Medicine as specialized medical care for people living with serious illness. It focuses on providing relief from the symptoms and stress of a serious illness. The goal is to improve quality of life for both the patient and the family.   Discussion: Patient and I reviewed his thoughts and feelings on his current illness, his progress during his time at [facility], and his satisfaction with his mobility. Emotional support and therapeutic listening was provided. He  endorses continued pain necessitating frequency doses of PRN Oxycodone , despite other pain management modalities and medications aimed at relieving his pain. Thankfully, he is still able to participate in therapy with his current level of pain. Reviewed process of titrating his methadone  for improved efficacy, including interval of dosage changes and follow up testing that is required. Patient is agreeable to increasing methadone  dosage today. He had to take a call on his room phone at this point in the conversation and I offered to check in again tomorrow.   Discussed the importance of continued conversation with family and the medical providers regarding overall plan of care and treatment options, ensuring decisions are within the context of the patients values and GOCs.   Questions and concerns were addressed.  The patient was encouraged to call with questions or concerns.  PMT will continue to support holistically.   SUMMARY OF RECOMMENDATIONS   -Increased methadone  dose to 12.5 mg Q8H. Will need to remain on this dose for minimum of 72 hours prior to further titration  -Continue Oxycodone  30mg  Q3H PRN for breakthrough pain while titrating methadone . Will re-assess frequency after new steady state of methadone  has been reached -Will obtain another EKG in 4-5 days given risk of QTC prolongation. Most recent Qtc of 433 on 12/8 EKG -Psychosocial and emotional support provided -PMT will continue to follow and support   Prognosis:  Unable to determine  Discharge Planning: To Be Determined      Primary Diagnoses: Present on Admission:  Spinal cord injury at T1-T6 level [facility])  Acute incomplete paraplegia [facility])   Physical Exam Vitals and nursing note reviewed.  Constitutional:      General: He is not in acute distress. HENT:     Head:  Normocephalic and atraumatic.  Cardiovascular:     Rate and Rhythm: Normal rate.  Pulmonary:     Effort: Pulmonary effort is normal.  Abdominal:      Palpations: Abdomen is soft.  Neurological:     Mental Status: He is alert and oriented to person, place, and time.  Psychiatric:        Mood and Affect: Mood normal.        Behavior: Behavior normal.     Vital Signs: BP 108/68 (BP Location: Left Wrist)   Pulse 93   Temp 98.7 F (37.1 C) (Oral)   Resp 16   Ht 5' 9 (1.753 m)   Wt (!) 148.7 kg   SpO2 92%   BMI 48.41 kg/m  Pain Scale: 0-10   Pain Score: 8    SpO2: SpO2: 92 % O2 Device:SpO2: 92 % O2 Flow Rate: .    Aithan Farrelly P Shantil Vallejo, PA-C  Palliative Medicine Team Team phone # [phone]  Thank you for allowing the Palliative Medicine Team to assist in the care of this patient. Please utilize secure chat with additional questions, if there is no response within 30 minutes please call the above phone number.  Palliative Medicine Team providers are available by phone from 7am to 7pm daily and can be reached through the team cell phone.  Should this patient require assistance outside of these hours, please call the patient's attending physician.    Billing based on MDM: Moderate  Problems Addressed: One acute or chronic illness or injury that poses a threat to life or bodily function  Amount and/or Complexity of Data: Category 1:Review of prior external note(s) from each unique source and Review of the result(s) of each unique test and Category 2:Independent interpretation of a test performed by another physician/other qualified health care professional (not separately reported)  Risks: Prescription drug management

## 2024-06-08 ENCOUNTER — Other Ambulatory Visit: Payer: Self-pay

## 2024-06-08 ENCOUNTER — Encounter (INDEPENDENT_AMBULATORY_CARE_PROVIDER_SITE_OTHER): Payer: Self-pay

## 2024-06-08 ENCOUNTER — Ambulatory Visit: Payer: MEDICAID

## 2024-06-08 DIAGNOSIS — M84521A Pathological fracture in neoplastic disease, right humerus, initial encounter for fracture: Secondary | ICD-10-CM

## 2024-06-08 DIAGNOSIS — R52 Pain, unspecified: Secondary | ICD-10-CM

## 2024-06-08 DIAGNOSIS — Z510 Encounter for antineoplastic radiation therapy: Secondary | ICD-10-CM | POA: Diagnosis not present

## 2024-06-08 DIAGNOSIS — F4329 Adjustment disorder with other symptoms: Secondary | ICD-10-CM

## 2024-06-08 LAB — RAD ONC ARIA SESSION SUMMARY
Course Elapsed Days: 6
Plan Fractions Treated to Date: 5
Plan Prescribed Dose Per Fraction: 3 Gy
Plan Total Fractions Prescribed: 10
Plan Total Prescribed Dose: 30 Gy
Reference Point Dosage Given to Date: 15 Gy
Reference Point Session Dosage Given: 3 Gy
Session Number: 5

## 2024-06-08 MED ORDER — ACETAMINOPHEN 500 MG PO TABS
1000.0000 mg | ORAL_TABLET | Freq: Three times a day (TID) | ORAL | Status: DC
  Administered 2024-06-08 – 2024-06-21 (×39): 1000 mg via ORAL
  Filled 2024-06-08 (×35): qty 2

## 2024-06-08 MED ORDER — CYCLOBENZAPRINE HCL 5 MG PO TABS
5.0000 mg | ORAL_TABLET | Freq: Three times a day (TID) | ORAL | Status: DC
  Administered 2024-06-08 – 2024-06-09 (×3): 5 mg via ORAL
  Filled 2024-06-08 (×3): qty 1

## 2024-06-08 MED ORDER — LIDOCAINE 5 % EX PTCH
2.0000 | MEDICATED_PATCH | Freq: Every day | CUTANEOUS | Status: DC
  Administered 2024-06-11 – 2024-06-12 (×2): 2 via TRANSDERMAL
  Filled 2024-06-08 (×11): qty 2

## 2024-06-08 MED ORDER — TAMSULOSIN HCL 0.4 MG PO CAPS
0.4000 mg | ORAL_CAPSULE | Freq: Every day | ORAL | Status: DC
  Administered 2024-06-08 – 2024-06-20 (×13): 0.4 mg via ORAL
  Filled 2024-06-08 (×12): qty 1

## 2024-06-08 NOTE — Progress Notes (Signed)
 PROGRESS NOTE   Subjective/Complaints:  Pt reports LBM yesterday. Didn't need intervention.  Pain is tolerable- stable   Having new B/L LE swelling- pt was concerned about this.  Admits he sits in w/c, chair with legs hanging down.  Admits doesn't feel like always empties bladder, and is open to Flomax .  Also up a lot at night trying to pee- small amounts  ROS: Per HPI   Pt denies SOB, abd pain, CP, N/V/C/D, and vision changes    Objective:   DG Humerus Right Result Date: 06/07/2024 CLINICAL DATA:  Humeral fracture, postop. EXAM: RIGHT HUMERUS - 2+ VIEW COMPARISON:  Preoperative imaging FINDINGS: Intramedullary nail with proximal and distal locking screw fixation traverse pathologic fracture of the proximal humerus. Stable fracture alignment. No new periprosthetic lucency. Proximal and distal skin staples in place. Multiple small linear densities in the soft tissues adjacent to the distal humerus may represent needle fragments, present on prior exam. IMPRESSION: ORIF of pathologic proximal humerus fracture. No hardware complication. Electronically Signed   By: Andrea Gasman M.D.   On: 06/07/2024 13:33   Recent Labs    06/06/24 0559  WBC 5.6  HGB 10.2*  HCT 32.8*  PLT PLATELET CLUMPS NOTED ON SMEAR, UNABLE TO ESTIMATE    Recent Labs    06/06/24 0559  NA 137  K 4.3  CL 101  CO2 28  GLUCOSE 110*  BUN 12  CREATININE 0.80  CALCIUM 8.7*    Intake/Output Summary (Last 24 hours) at 06/08/2024 0753 Last data filed at 06/08/2024 0744 Gross per 24 hour  Intake 1160 ml  Output 1400 ml  Net -240 ml        Physical Exam: Vital Signs Blood pressure 117/78, pulse 90, temperature 98.3 F (36.8 C), temperature source Oral, resp. rate 18, height 5' 9 (1.753 m), weight (!) 148.7 kg, SpO2 96%.      General: awake, alert, appropriate, sitting up in bed watching TV; NAD HENT: conjugate gaze; oropharynx  moist CV: regular rate and rhythm; no JVD Pulmonary: CTA B/L; no W/R/R- good air movement GI: soft, NT, ND, (+)BS- protuberant Psychiatric: appropriate, quiet, but interactive Neurological: Ox3   Extremities: RUE not in sling this AM- no change- very TTP LE swelling to ankles 2+ B/L- has been getting worse  PRIOR EXAMS: Musculoskeletal:     Cervical back: Neck supple.     Comments: LUE 5/5 RUE- at least 3/5 proximally; and 5-/5 distally- limited by pain and surgery of proximal humerus RLE- HF 3-/5; KE/KF 4+/5; DF 4/5 and PF 3-/5 LLE- HF 3+/5; KE/KF 5-/5 and DF/PF 5-/5  Skin:    General: Skin is warm and dry.     Comments: Bandage seen on R proximal arm- c/d/I Didn't assess thoracic incision- due to pt's sleepiness-didn't turn over  Neurological:     Comments: 5-6 beats clonus B/L No hoffman's MAS of 1 in LE's- much better Intact to light touch in all 4 extremities   Assessment/Plan: 1. Functional deficits which require 3+ hours per day of interdisciplinary therapy in a comprehensive inpatient rehab setting. Physiatrist is providing close team supervision and 24 hour management of active medical problems listed below. Physiatrist and  rehab team continue to assess barriers to discharge/monitor patient progress toward functional and medical goals  Care Tool:  Bathing    Body parts bathed by patient: Right arm, Chest, Abdomen, Front perineal area, Right upper leg, Left upper leg, Right lower leg, Left lower leg, Face   Body parts bathed by helper: Left arm     Bathing assist Assist Level: Minimal Assistance - Patient > 75%     Upper Body Dressing/Undressing Upper body dressing   What is the patient wearing?: Pull over shirt    Upper body assist Assist Level: Supervision/Verbal cueing    Lower Body Dressing/Undressing Lower body dressing      What is the patient wearing?: Pants     Lower body assist Assist for lower body dressing: Minimal Assistance - Patient >  75%     Toileting Toileting    Toileting assist Assist for toileting: Moderate Assistance - Patient 50 - 74%     Transfers Chair/bed transfer  Transfers assist     Chair/bed transfer assist level: Contact Guard/Touching assist     Locomotion Ambulation   Ambulation assist      Assist level: Contact Guard/Touching assist Assistive device: Walker-rolling Max distance: 27ft   Walk 10 feet activity   Assist     Assist level: Contact Guard/Touching assist Assistive device: Walker-rolling   Walk 50 feet activity   Assist Walk 50 feet with 2 turns activity did not occur: Safety/medical concerns (weakness/fatigue/pain)  Assist level: Contact Guard/Touching assist Assistive device: Walker-rolling    Walk 150 feet activity   Assist Walk 150 feet activity did not occur: Safety/medical concerns (weakness/fatigue/pain)         Walk 10 feet on uneven surface  activity   Assist Walk 10 feet on uneven surfaces activity did not occur: Safety/medical concerns (weakness/fatigue/pain)         Wheelchair     Assist Is the patient using a wheelchair?: Yes Type of Wheelchair: Manual    Wheelchair assist level: Maximal Assistance - Patient 25 - 49% (B LE and L UE) Max wheelchair distance: 10 ft    Wheelchair 50 feet with 2 turns activity    Assist        Assist Level: Total Assistance - Patient < 25%   Wheelchair 150 feet activity     Assist      Assist Level: Total Assistance - Patient < 25%   Blood pressure 117/78, pulse 90, temperature 98.3 F (36.8 C), temperature source Oral, resp. rate 18, height 5' 9 (1.753 m), weight (!) 148.7 kg, SpO2 96%.   Medical Problem List and Plan: 1. 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              -patient may  shower             -ELOS/Goals: ~ 14 days- supervision             Admit to CIR Appropriate for CIR-  didn't finish first time due to R humeral pathological fx due to Thyroid  cancer mets  Con't CIR PT and OT  Tumor board hopefully meeting today   2.  Antithrombotics: -DVT/anticoagulation:  Mechanical: Sequential compression devices, below knee Bilateral lower extremities Pharmaceutical: Eliquis  5mg  BID due to PE 11/10- RUL segmental PE             -antiplatelet therapy: N/A   3. Pain Management: Robaxin  750 TID AC/HS, Lyrica  50 mg TID,  Methadone  10 mg TID. Flexeril  10mg  TID, lidoderm  patches. Oxycodone  30 mg q3 hours as needed for breakthrough pain. Palliative care to follow.  12/16- Called Palliative care- will see if can increase Methadone  with goal to reduce Oxycodone  - see if can move Oxy to q4 hours? con't regimen for now, but called and placed consult 12.17- waiting to d/w Palliative care 4. Mood/Behavior/Sleep: LCSW to follow for evaluation and support when available.              -antipsychotic agents: n/a   -Trazodone  PRN  5. Neuropsych/cognition: This patient is capable of making decisions on his own behalf.   6. Skin/Wound Care: Routine pressure relief measures. Monitor incisions   7. Fluids/Electrolytes/Nutrition: Monitor I&O and weight. Follow up labs CBC/CMP, continue vitamins/supplements.    8. 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 Completed course of dexamethasone . Patient to follow up outpatient with ENT, endocrinology and radiation oncology. ENT plan to discuss at multidisciplinary tumor board on 06/08/24.             - Will likely need Thyroidectomy and RAI-waiting for tumor board  12/16-12/17 should occur at tumor board 12/17 - waiting to hear from Oncology  9. Pathological right proximal humerus fracture: s/p IMR 11/29. WBAT in RUE due to needing it- use platform walker.  Ortho follow up in 2 weeks for recheck and updated xrays.  -Patient to remain in sling.   12/17- Xray of R shoulder done- has needle fragments, but otherwise is  healing 10.Hx of thoracic spine fx: Pathological in etiology. Patient underwent instrumentation and fusion on prior admission. Radiation Onc plans 2 weeks of radiotherapy to thoracic spine starts 06/02/24- right humerus on 06/09/24.   11. Hx of RUL segmental  PE: dx on 11/6 provoked secondary to metastatic cancer. Continue Eliquis  for 3-6 months.   12. Incomplete Paraplegia/Neurogenic bladder: Monitor bladder scan with PVR  12/12- will order in/out caths and bladder scans q6 hours- to see if needs cathing -12/13-14/25 no cathing overnight, urinating well   12/16- PVR's 4 to 126 cc in last 24 hours  12/17- Will start Flomax - we both agree he's not emptying all the time-  0.4 mg q supper- hopefully will help frequency  13. Chronic normocytic anemia: Stable, continue Niferex 150 mg daily    14. Constipation/mild neurogenic bowel: Continue Miralax   daily and Senokot S 2 tabs BID 12/17- LBM yesterday  15. Polysubstance use disorder- will have palliative care f/u with meds after d/c for wean   12/16- called palliative care to help with this 16. LE edema B/L  12/17- will order ACE wraps and d/w pt elevating his legs to level of heart when not in therapy   I spent a total of 43   minutes on total care today- >50% coordination of care- due to  D/w pt about bladder, LE edema and IV- also d/w nursing- review of vitals and B/B.     LOS: 6 days A FACE TO FACE EVALUATION WAS PERFORMED  Janalee Grobe 06/08/2024, 7:53 AM

## 2024-06-08 NOTE — Progress Notes (Signed)
 Physical Therapy Session Note  Patient Details  Name: Joseph [name] MRN: [id number] Date of Birth: 1981-01-21  Today's Date: 06/08/2024 PT Individual Time: 0800-0859 PT Individual Time Calculation (min): 59 min   Short Term Goals: Week 1:  PT Short Term Goal 1 (Week 1): Pt will perform sit to supine transfers with min A PT Short Term Goal 2 (Week 1): Pt will perform sit <> stand transfers with CGA PT Short Term Goal 3 (Week 1): Pt will perform bed <> WC transfers with CGA  Skilled Therapeutic Interventions/Progress Updates:     Pt semi-reclined in bed upon arrival. Pt denies pain, premedicated, and agreeable to therapy. Session emphasized functional strengthening and endurance with ambulation, stair negotiation, WC mobility, and UE ROM activities. Pt performed supine to sit with S and ++ time - some dizziness upon sitting that subsided within a couple mins of sitting. PT donned socks dependent and pt donned shirt with light min A. Sit to stand to RW with CGA. PT amb using RW ~100 ft with CGA and WC in tow. Transported dependent in WC remainder of distance to main gym for time/energy management. Pt performed 2x5 step ups to 6 inch step with B HR (light use on R) with min A and VC for sequencing - tactile cueing to R quad when stepping down to promote activation and prevent buckling. Prolonged seated rest break between bouts due to fatigue. Pt self-propelled WC from main gym to ortho gym with S - pt navigated sharp turns and doorways with VC to watch out for his hands. Pt utilized UBE on level 1 for R UE ROM, 5 min forward, 5 min backward, with 2 min rest break in between due to fatigue. Once back in room, pt remained seated in [place] with all needs in reach.  Therapy Documentation Precautions:  Precautions Precautions: Fall, Back, Shoulder Type of Shoulder Precautions: WBAT R UE Shoulder Interventions: Shoulder sling/immobilizer Precaution Booklet Issued: Yes (comment) Recall of  Precautions/Restrictions: Intact Required Braces or Orthoses: Sling Restrictions Weight Bearing Restrictions Per Provider Order: Yes RUE Weight Bearing Per Provider Order: Weight bearing as tolerated RLE Weight Bearing Per Provider Order: Weight bearing as tolerated LLE Weight Bearing Per Provider Order: Weight bearing as tolerated Other Position/Activity Restrictions: Per Dr. Dozier We will allow weightbearing to tolerance given his lower extremity paraplegia and need for his upper extremities  Therapy/Group: Individual Therapy  Comer CHRISTELLA Levora Comer Levora, PT, DPT 06/08/2024, 7:45 AM

## 2024-06-08 NOTE — Progress Notes (Addendum)
 Patient ID: Joseph [name], male   DOB: [birth year], 43 y.o.   MRN: [id number]  SW sent PCS referral to [email or web address] . *received confirmation email referral was received.   *patient will discharge to home with outpatient as no [facility] willing to accept.   Declined HHAs Kelly/[facility] Kas[facility] Angie/[facility]- no contract Cheryl/[facility]- not in network Brittany/[facility]- [facility] Lynette/[facility] Cory/[facility] Roselyn/intake with [facility]- not in network Becca/Intake with [facility]- not in network   *1322- SW called pt mother to inform on above challenges with obtaining HH. Pt will be set up for outpatient at [facility] for PT/OT.   SW called customer Service [place] [phone]). SW informed will need to call [facility] Customer Service at [phone]. SW spoke with [facility] Customer Service stating transportation services are with [employer] [phone]) and Case Management Services Case with [facility] [phone]).  SW made efforts to contact [employer] but the person answered stated they would call back as in a meeting. SW will wait for follow-up to discuss resources available.  Graeme Jude, MSW, LCSW Office: [phone] Cell: [phone] Fax: [phone]

## 2024-06-08 NOTE — Progress Notes (Signed)
 Occupational Therapy Session Note  Patient Details  Name: Joseph [name] MRN: [id number] Date of Birth: 02/10/1981  Today's Date: 06/08/2024 OT Individual Time: 9069-8954 & 1300-1415 OT Individual Time Calculation (min): 75 min & 75 min   Short Term Goals: Week 1:  OT Short Term Goal 1 (Week 1): Pt will complete sit  > stand with CGA OT Short Term Goal 2 (Week 1): Pt will complete toileting tasks with mod A OT Short Term Goal 3 (Week 1): Pt will complete functional mobility into the bathroom with CGA using the RW OT Short Term Goal 4 (Week 1): Pt will complete LB dressing with LRAD with min A  Skilled Therapeutic Interventions/Progress Updates:      Therapy Documentation Precautions:  Precautions Precautions: Fall, Back, Shoulder Type of Shoulder Precautions: WBAT R UE Shoulder Interventions: Shoulder sling/immobilizer Precaution Booklet Issued: Yes (comment) Recall of Precautions/Restrictions: Intact Required Braces or Orthoses: Sling Restrictions Weight Bearing Restrictions Per Provider Order: Yes RUE Weight Bearing Per Provider Order: Weight bearing as tolerated RLE Weight Bearing Per Provider Order: Weight bearing as tolerated LLE Weight Bearing Per Provider Order: Weight bearing as tolerated Other Position/Activity Restrictions: Per Dr. Dozier We will allow weightbearing to tolerance given his lower extremity paraplegia and need for his upper extremities Session 1 General: Pt supine in bed upon OT arrival, agreeable to OT session.  Pain:  6/10 pain reported in Rt shoulder, activity, intermittent rest breaks, distractions provided for pain management, pt reports tolerable to proceed.   ADL: OT providing skilled intervention on ADL retraining in order to increase independence with tasks and increase activity tolerance. Pt completed the following tasks at the current level of assist: Bed mobility: SBA with increased time supine><EOB, pt able to manage his legs on and  off the bed this date  UB dressing:set up seated in W/C for donning and doffing overhead shirt LB dressing: CGA, able to manage pants over waist for first time today!  Footwear: total A for sock management Shower transfer: CGA with RW ambulating from room><shower with no LOB, able to navigate uphill into bathroom Bathing: Min A with assistance to wash lower back, OT issued long handle sponge for increased independence with managing LB bathing  Pt supine in bed with bed alarm activated, 2 bed rails up, call light within reach and 4Ps assessed.   Session 2 General: Pt supine in bed upon OT arrival, agreeable to OT session.  Pain:  6/10 pain reported in Rt shoulder, activity, intermittent rest breaks, distractions provided for pain management, pt reports tolerable to proceed.   Exercises: Pt completed a variety of activities in order to promote increased balance strategies with ADL participation as well as increased Rt shoulder ROM. Pt completed all activities at standing level with intermittent supported/unsupported standing at RW. Pt completed 2x5 sit to stands without RW with VC for good body mechanics. Pt also completing static standing at RW using Rt shoulder for reaching out of BOS to Rt, anterior and across midline. Pt completing 2 rounds of standing with reaching for 5< min with rest break in between for energy conservation. After activity OT instructing pt on shoulder stretching on table using anterior towel slide method with 5 second hold for prolonged stretch.   Other treatments: OT educating pt on DME such as items that come in hip kit and RW accessories for increased independence once D/C. OT having pt trial sock aide and pt able to complete with Min A for thoroughness.   Pt supine in  bed with bed alarm activated, 2 bed rails up, call light within reach and 4Ps assessed.   Therapy/Group: Individual Therapy  Camie Hoe, OTD, OTR/L 06/08/2024, 4:27 PM

## 2024-06-08 NOTE — Plan of Care (Signed)
  Problem: RH PAIN MANAGEMENT Goal: RH STG PAIN MANAGED AT OR BELOW PT'S PAIN GOAL Description: <4 w/ prns Outcome: Progressing

## 2024-06-08 NOTE — Progress Notes (Signed)
 PCP established with [facility] health [facility] and adult medicine on 06/22/2024 at 9am. Patient informed, appointment paper given to patient.

## 2024-06-08 NOTE — Progress Notes (Signed)
 Daily Progress Note   Patient Name: Joseph [name]       Date: 06/08/2024 DOB: 04-22-1981  Age: 43 y.o. MRN#: [id number] Attending Physician: Joseph Bouchard, MD Primary Care Physician: Patient, No Pcp Per Admit Date: 06/02/2024  Reason for Consultation/Follow-up: Pain control  Subjective: Medical records reviewed including progress notes, labs, imaging, MAR. Patient assessed at the bedside.  He reports pain is a little better today, now 5 out of 10.  Worse with certain movements and occasionally feels like tightening.  Patient required 5 doses of as needed oxycodone  in the past 24 hours.  No visitors were present during the visit.  Created space and opportunity for patient's thoughts and feelings on patient's current illness.  We discussed his therapy, arrangements for discharge planning, and his hope to return to a more normal routine and put all of this behind him.  He clarified that he was experiencing severe cramping in his legs shortly after his surgery, which has since resolved.  We discussed the limited use of muscle relaxers without evidence of benefit after the first 3 weeks or so.  Discussed possible side effects including his drowsiness.  He is agreeable to my recommendation of discontinuing Robaxin  and weaning down Flexeril  gradually.  He is also agreeable to recommendations of scheduling Tylenol  today and starting a lidocaine  patch on his right shoulder now that staples are out and his incision is healed.  Reviewed plan to titrate methadone  further on Friday.  Questions and concerns addressed. PMT will continue to support holistically.   Length of Stay: 6   Physical Exam Vitals and nursing note reviewed.  Constitutional:      General: He is not in acute distress. HENT:     Head: Normocephalic and atraumatic.  Cardiovascular:      Rate and Rhythm: Normal rate.  Pulmonary:     Effort: Pulmonary effort is normal. No respiratory distress.  Skin:    General: Skin is warm and dry.  Neurological:     Mental Status: He is alert and oriented to person, place, and time.  Psychiatric:        Mood and Affect: Mood normal.        Behavior: Behavior normal.            Vital Signs: BP 117/78 (BP Location: Left Wrist)   Pulse 90   Temp 98.3 F (36.8 C) (Oral)   Resp 18   Ht 5' 9 (1.753 m)   Wt (!) 148.7 kg   SpO2 96%   BMI 48.41 kg/m  SpO2: SpO2: 96 % O2 Device: O2 Device: Room Air O2  Flow Rate:    Palliative Care Assessment & Plan   Patient Profile: 43 y.o. 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    Patient was admitted 11/5-11/24 for post-op management of posterior T1-3 instrumentation fusion T2 corpectomy for resection of tumor, acute PE, thyroid  biopsy confirming papillary carcinoma, acute blood loss anemia due to procedure. He was then in CIR from 11/24-11/28, though participation in therapy was limited due to an acute fracture/metastasis. Patient was seen by PMT for pain control during readmission 11/29-12/11 for pathological fracture of right humerus. He has been in CIR since then with upcoming tumor board to discuss thyroidectomy.    PMT has been consulted to assist with acute cancer-related pain management.  Assessment: Acute on chronic right shoulder pain due to pathological fracture Metastatic thyroid  cancer  Recommendations/Plan: Continue methadone  12.5 mg p.o. every 8 hours Continue oxycodone  30 mg p.o. every 3 hours as needed for breakthrough pain while titrating methadone  Scheduled Tylenol  1000 mg p.o. 3 times daily Ordered lidocaine  patch  for right shoulder Discontinued Robaxin  Decreased dosage of Flexeril  to 5 mg 3 times daily Psychosocial and emotional support provided PMT will continue to follow and support   Prognosis:  Unable to determine  Discharge Planning: Home with Palliative Services  Care plan was discussed with patient, MD          Mickle [name], PA-C  Palliative Medicine Team Team phone # [phone]  Thank you for allowing the Palliative Medicine Team to assist in the care of this patient. Please utilize secure chat with additional questions, if there is no response within 30 minutes please call the above phone number.  Palliative Medicine Team providers are available by phone from 7am to 7pm daily and can be reached through the team cell phone.  Should this patient require assistance outside of these hours, please call the patient's attending physician.    Time Total: 50  Visit consisted of counseling and education dealing with the complex and emotionally intense issues of symptom management and palliative care in the setting of serious and potentially life-threatening illness. Greater than 50% of this time was spent counseling and coordinating care related to the above assessment and plan.  Personally spent 50 minutes in patient care including extensive chart review (labs, imaging, progress/consult notes, vital signs), medically appropraite exam, discussed with treatment team, education to patient, family, and staff, documenting clinical information, medication review and management, coordination of care, and available advanced directive documents.

## 2024-06-09 ENCOUNTER — Ambulatory Visit: Payer: MEDICAID

## 2024-06-09 ENCOUNTER — Encounter (HOSPITAL_COMMUNITY): Payer: Self-pay

## 2024-06-09 ENCOUNTER — Other Ambulatory Visit: Payer: Self-pay

## 2024-06-09 DIAGNOSIS — Z510 Encounter for antineoplastic radiation therapy: Secondary | ICD-10-CM | POA: Diagnosis not present

## 2024-06-09 LAB — RAD ONC ARIA SESSION SUMMARY
Course Elapsed Days: 7
Plan Fractions Treated to Date: 1
Plan Fractions Treated to Date: 6
Plan Prescribed Dose Per Fraction: 3 Gy
Plan Prescribed Dose Per Fraction: 4 Gy
Plan Total Fractions Prescribed: 10
Plan Total Fractions Prescribed: 5
Plan Total Prescribed Dose: 20 Gy
Plan Total Prescribed Dose: 30 Gy
Reference Point Dosage Given to Date: 18 Gy
Reference Point Dosage Given to Date: 4 Gy
Reference Point Session Dosage Given: 3 Gy
Reference Point Session Dosage Given: 4 Gy
Session Number: 6

## 2024-06-09 LAB — BASIC METABOLIC PANEL WITH GFR
Anion gap: 6 (ref 5–15)
BUN: 15 mg/dL (ref 6–20)
CO2: 30 mmol/L (ref 22–32)
Calcium: 8.9 mg/dL (ref 8.9–10.3)
Chloride: 101 mmol/L (ref 98–111)
Creatinine, Ser: 0.66 mg/dL (ref 0.61–1.24)
GFR, Estimated: >60 mL/min (ref >60)
Glucose, Bld: 111 mg/dL — ABNORMAL HIGH (ref 70–99)
Potassium: 4.1 mmol/L (ref 3.5–5.1)
Sodium: 137 mmol/L (ref 135–145)

## 2024-06-09 LAB — CBC
HCT: 32.5 % — ABNORMAL LOW (ref 39.0–52.0)
Hemoglobin: 10.1 g/dL — ABNORMAL LOW (ref 13.0–17.0)
MCH: 28.9 pg (ref 26.0–34.0)
MCHC: 31.1 g/dL (ref 30.0–36.0)
MCV: 93.1 fL (ref 80.0–100.0)
Platelets: UNDETERMINED K/uL (ref 150–400)
RBC: 3.49 MIL/uL — ABNORMAL LOW (ref 4.22–5.81)
RDW: 14.4 % (ref 11.5–15.5)
WBC: 5.1 K/uL (ref 4.0–10.5)
nRBC: 0 % (ref 0.0–0.2)

## 2024-06-09 MED ORDER — CYCLOBENZAPRINE HCL 5 MG PO TABS
5.0000 mg | ORAL_TABLET | Freq: Three times a day (TID) | ORAL | Status: DC | PRN
  Administered 2024-06-11 – 2024-06-21 (×5): 5 mg via ORAL
  Filled 2024-06-09 (×4): qty 1

## 2024-06-09 NOTE — Progress Notes (Signed)
 PROGRESS NOTE   Subjective/Complaints:  Pt reports LBM yesterday- didn't need additional meds to go.  Pain 5/10 with meds/at rest and with movement- whenever moves R shoulder anteriorly/up, it hurts more.  Most of pain in R shoulder  Didn't wear ACE wraps yesterday- d/w nursing ROS: Per HPI   Pt denies SOB, abd pain, CP, N/V/C/D, and vision changes    Objective:   DG Humerus Right Result Date: 06/07/2024 CLINICAL DATA:  Humeral fracture, postop. EXAM: RIGHT HUMERUS - 2+ VIEW COMPARISON:  Preoperative imaging FINDINGS: Intramedullary nail with proximal and distal locking screw fixation traverse pathologic fracture of the proximal humerus. Stable fracture alignment. No new periprosthetic lucency. Proximal and distal skin staples in place. Multiple small linear densities in the soft tissues adjacent to the distal humerus may represent needle fragments, present on prior exam. IMPRESSION: ORIF of pathologic proximal humerus fracture. No hardware complication. Electronically Signed   By: Andrea Gasman M.D.   On: 06/07/2024 13:33   Recent Labs    06/09/24 0540  WBC 5.1  HGB 10.1*  HCT 32.5*  PLT PLATELET CLUMPS NOTED ON SMEAR, UNABLE TO ESTIMATE    Recent Labs    06/09/24 0540  NA 137  K 4.1  CL 101  CO2 30  GLUCOSE 111*  BUN 15  CREATININE 0.66  CALCIUM 8.9    Intake/Output Summary (Last 24 hours) at 06/09/2024 1014 Last data filed at 06/09/2024 1011 Gross per 24 hour  Intake 720 ml  Output 1400 ml  Net -680 ml        Physical Exam: Vital Signs Blood pressure 132/83, pulse 91, temperature 97.7 F (36.5 C), temperature source Oral, resp. rate 19, height 5' 9 (1.753 m), weight (!) 148.7 kg, SpO2 95%.      General: awake, alert, appropriate,  sitting up in bed; not wearing RUE sling; NAD HENT: conjugate gaze; oropharynx moist CV: regular rate and rhythm- rate in 90's; no JVD Pulmonary: CTA B/L; no  W/R/R- good air movement GI: soft, NT, ND, (+)BS- normoactive Psychiatric: appropriate Neurological: Ox3   Extremities: RUE not in sling this AM- no change- very TTP LE swelling to ankles 2+ B/L- looks stable today-   PRIOR EXAMS: Musculoskeletal:     Cervical back: Neck supple.     Comments: LUE 5/5 RUE- at least 3/5 proximally; and 5-/5 distally- limited by pain and surgery of proximal humerus RLE- HF 3-/5; KE/KF 4+/5; DF 4/5 and PF 3-/5 LLE- HF 3+/5; KE/KF 5-/5 and DF/PF 5-/5  Skin:    General: Skin is warm and dry.     Comments: Bandage seen on R proximal arm- c/d/I Didn't assess thoracic incision- due to pt's sleepiness-didn't turn over  Neurological:     Comments: 5-6 beats clonus B/L No hoffman's MAS of 1 in LE's- much better Intact to light touch in all 4 extremities   Assessment/Plan: 1. Functional deficits which require 3+ hours per day of interdisciplinary therapy in a comprehensive inpatient rehab setting. Physiatrist is providing close team supervision and 24 hour management of active medical problems listed below. Physiatrist and rehab team continue to assess barriers to discharge/monitor patient progress toward functional and medical goals  Care Tool:  Bathing    Body parts bathed by patient: Right arm, Chest, Abdomen, Front perineal area, Right upper leg, Left upper leg, Right lower leg, Left lower leg, Face   Body parts bathed by helper: Left arm     Bathing assist Assist Level: Minimal Assistance - Patient > 75%     Upper Body Dressing/Undressing Upper body dressing   What is the patient wearing?: Pull over shirt    Upper body assist Assist Level: Supervision/Verbal cueing    Lower Body Dressing/Undressing Lower body dressing      What is the patient wearing?: Pants     Lower body assist Assist for lower body dressing: Minimal Assistance - Patient > 75%     Toileting Toileting    Toileting assist Assist for toileting: Moderate Assistance  - Patient 50 - 74%     Transfers Chair/bed transfer  Transfers assist     Chair/bed transfer assist level: Contact Guard/Touching assist     Locomotion Ambulation   Ambulation assist      Assist level: Contact Guard/Touching assist Assistive device: Walker-rolling Max distance: 74ft   Walk 10 feet activity   Assist     Assist level: Contact Guard/Touching assist Assistive device: Walker-rolling   Walk 50 feet activity   Assist Walk 50 feet with 2 turns activity did not occur: Safety/medical concerns (weakness/fatigue/pain)  Assist level: Contact Guard/Touching assist Assistive device: Walker-rolling    Walk 150 feet activity   Assist Walk 150 feet activity did not occur: Safety/medical concerns (weakness/fatigue/pain)         Walk 10 feet on uneven surface  activity   Assist Walk 10 feet on uneven surfaces activity did not occur: Safety/medical concerns (weakness/fatigue/pain)         Wheelchair     Assist Is the patient using a wheelchair?: Yes Type of Wheelchair: Manual    Wheelchair assist level: Maximal Assistance - Patient 25 - 49% (B LE and L UE) Max wheelchair distance: 10 ft    Wheelchair 50 feet with 2 turns activity    Assist        Assist Level: Total Assistance - Patient < 25%   Wheelchair 150 feet activity     Assist      Assist Level: Total Assistance - Patient < 25%   Blood pressure 132/83, pulse 91, temperature 97.7 F (36.5 C), temperature source Oral, resp. rate 19, height 5' 9 (1.753 m), weight (!) 148.7 kg, SpO2 95%.   Medical Problem List and Plan: 1. 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              -patient may  shower             -ELOS/Goals: ~ 14 days- supervision             Admit to CIR Appropriate for CIR- didn't finish first time due to R humeral pathological fx due to Thyroid  cancer mets  Tumor board  hopefully meeting soon- haven't heard from Oncology  D/c 12/30  Con't CIR PT and OT- needs ACE wraps- written for- educated pt and nursing that needs them for swelling 2.  Antithrombotics: -DVT/anticoagulation:  Mechanical: Sequential compression devices, below knee Bilateral lower extremities Pharmaceutical: Eliquis  5mg  BID due to PE 11/10- RUL segmental PE             -antiplatelet therapy: N/A   3. Pain Management: Robaxin  750 TID AC/HS, Lyrica   50 mg TID, Methadone  10 mg TID. Flexeril  10mg  TID, lidoderm  patches. Oxycodone  30 mg q3 hours as needed for breakthrough pain. Palliative care to follow.  12/16- Called Palliative care- will see if can increase Methadone  with goal to reduce Oxycodone  - see if can move Oxy to q4 hours? con't regimen for now, but called and placed consult 12/18- Methadone  up to 12.5 mg q8 hours and Oxycodone  30 mg q3 hours- 5 dose sin last 24 hours- they also started scheduled Tylenol  and stopped Robaxin - weaning flexeril  4. Mood/Behavior/Sleep: LCSW to follow for evaluation and support when available.              -antipsychotic agents: n/a   -Trazodone  PRN  5. Neuropsych/cognition: This patient is capable of making decisions on his own behalf.   6. Skin/Wound Care: Routine pressure relief measures. Monitor incisions   7. Fluids/Electrolytes/Nutrition: Monitor I&O and weight. Follow up labs CBC/CMP, continue vitamins/supplements.    8. 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 Completed course of dexamethasone . Patient to follow up outpatient with ENT, endocrinology and radiation oncology. ENT plan to discuss at multidisciplinary tumor board on 06/08/24.             - Will likely need Thyroidectomy and RAI-waiting for tumor board  12/16-12/17 should occur at tumor board 12/17 - waiting to hear from Oncology  12/18- waiting for note form Oncology -will reach out about it 9. Pathological right proximal humerus fracture: s/p IMR 11/29. WBAT in  RUE due to needing it- use platform walker.  Ortho follow up in 2 weeks for recheck and updated xrays.  -Patient to remain in sling.   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 Patient underwent instrumentation and fusion on prior admission. Radiation Onc plans 2 weeks of radiotherapy to thoracic spine starts 06/02/24- right humerus on 06/09/24.   11. Hx of RUL segmental  PE: dx on 11/6 provoked secondary to metastatic cancer. Continue Eliquis  for 3-6 months.   12. Incomplete Paraplegia/Neurogenic bladder: Monitor bladder scan with PVR  12/12- will order in/out caths and bladder scans q6 hours- to see if needs cathing -12/13-14/25 no cathing overnight, urinating well   12/16- PVR's 4 to 126 cc in last 24 hours  12/17- Will start Flomax - we both agree he's not emptying all the time-  0.4 mg q supper- hopefully will help frequency  12/18- no PVR's- will reorder since one in last 24 hours 13. Chronic normocytic anemia: Stable, continue Niferex 150 mg daily    14. Constipation/mild neurogenic bowel: Continue Miralax   daily and Senokot S 2 tabs BID  12/18- LBM yesterday- no additional intervention  15. Polysubstance use disorder- will have palliative care f/u with meds after d/c for wean   12/16- called palliative care to help with this 16. LE edema B/L  12/17- will order ACE wraps and d/w pt elevating his legs to level of heart when not in therapy  12/18- d/w nursing-    I spent a total of  53  minutes on total care today- >50% coordination of care- due to  Review of notes, orders, esp from palliative care- review of labs, vitals and B/B- esp went over bladder scans. Reaching out to tumor board  LOS: 7 days A FACE TO FACE EVALUATION WAS PERFORMED  Mariana Goytia 06/09/2024, 10:14 AM

## 2024-06-09 NOTE — Progress Notes (Signed)
 Daily Progress Note   Patient Name: Joseph [name]       Date: 06/09/2024 DOB: 08-10-1980  Age: 43 y.o. MRN#: [id number] Attending Physician: [name], MD Primary Care Physician: Patient, No Pcp Per Admit Date: 06/02/2024  Reason for Consultation/Follow-up: Pain control  Subjective: Medical records reviewed including progress notes, labs, imaging, MAR. Patient assessed at the bedside.  He was returning to bed with assistance from PT. Reports that pain was 3-4/10 before PT session and now 7-8/10 afterwards. Assisted him with calling RN for his requested PRN Oxycodone . Per MAR, he has required 4 doses of PRN Oxycodone  in the past 24 hours. No visitors present.  Created space and opportunity for patient's thoughts and feelings on patient's current illness. He is in good spirits. He denies any adverse effects from discontinuation of Robaxin  and decreased dose of Flexeril . He is agreeable to adjusting Flexeril  further today to only PRN. Not having much benefit from lidocaine  though he feels tylenol  may be helping slightly.   Questions and concerns addressed. PMT will continue to support holistically.   Length of Stay: 7   Physical Exam Vitals and nursing note reviewed.  Constitutional:      General: He is not in acute distress. HENT:     Head: Normocephalic and atraumatic.  Cardiovascular:     Rate and Rhythm: Normal rate.  Pulmonary:     Effort: Pulmonary effort is normal. No respiratory distress.  Skin:    General: Skin is warm and dry.  Neurological:     Mental Status: He is alert and oriented to person, place, and time.  Psychiatric:        Mood and Affect: Mood normal.        Behavior: Behavior normal.            Vital Signs: BP 132/83 (BP Location: Left Arm)   Pulse 91   Temp 97.7 F (36.5 C) (Oral)   Resp 19   Ht  5' 9 (1.753 m)   Wt (!) 148.7 kg   SpO2 95%   BMI 48.41 kg/m  SpO2: SpO2: 95 % O2 Device: O2 Device: Room Air O2 Flow Rate:    Palliative Care Assessment & Plan   Patient Profile: 43 y.o. 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    Patient was admitted 11/5-11/24 for post-op management of posterior T1-3 instrumentation fusion T2 corpectomy for resection of tumor, acute PE, thyroid  biopsy confirming papillary carcinoma, acute blood loss anemia due to procedure. He was then in CIR from 11/24-11/28, though participation in therapy was limited due to an acute fracture/metastasis. Patient was seen by PMT for pain control during readmission 11/29-12/11 for pathological fracture of right humerus. He has been in CIR since then with upcoming tumor board to discuss thyroidectomy.    PMT has been consulted to assist with acute cancer-related pain management.  Assessment: Acute on chronic right shoulder pain due to pathological fracture Metastatic thyroid  cancer  Recommendations/Plan: Continue methadone  12.5 mg p.o. every 8 hours  Continue oxycodone  30 mg p.o. every 3 hours as needed for breakthrough pain while titrating methadone  Continue Tylenol  1000 mg p.o. 3 times daily Change frequency of Flexeril  5 mg 3 times daily PRN Psychosocial and emotional support provided PMT will continue to follow and support   Prognosis:  Unable to determine  Discharge Planning: Home with Palliative Services   Care plan was discussed with patient, RN, PT         Sosha Shepherd [name], PA-C  Palliative Medicine Team Team phone # [phone]  Thank you for allowing the Palliative Medicine Team to assist in the care of this patient. Please utilize secure  chat with additional questions, if there is no response within 30 minutes please call the above phone number.  Palliative Medicine Team providers are available by phone from 7am to 7pm daily and can be reached through the team cell phone.  Should this patient require assistance outside of these hours, please call the patient's attending physician.    Time Total: 35  Visit consisted of counseling and education dealing with the complex and emotionally intense issues of symptom management and palliative care in the setting of serious and potentially life-threatening illness. Greater than 50% of this time was spent counseling and coordinating care related to the above assessment and plan.  Personally spent 35 minutes in patient care including extensive chart review (labs, imaging, progress/consult notes, vital signs), medically appropraite exam, discussed with treatment team, education to patient, family, and staff, documenting clinical information, medication review and management, coordination of care, and available advanced directive documents.

## 2024-06-09 NOTE — Progress Notes (Signed)
 Occupational Therapy Session Note  Patient Details  Name: Joseph [name] MRN: [id number] Date of Birth: 05-06-1981  Today's Date: 06/09/2024 OT Individual Time: 1105-1205 OT Individual Time Calculation (min): 60 min    Short Term Goals: Week 1:  OT Short Term Goal 1 (Week 1): Pt will complete sit  > stand with CGA OT Short Term Goal 2 (Week 1): Pt will complete toileting tasks with mod A OT Short Term Goal 3 (Week 1): Pt will complete functional mobility into the bathroom with CGA using the RW OT Short Term Goal 4 (Week 1): Pt will complete LB dressing with LRAD with min A  Skilled Therapeutic Interventions/Progress Updates:      Therapy Documentation Precautions:  Precautions Precautions: Fall, Back, Shoulder Type of Shoulder Precautions: WBAT R UE Shoulder Interventions: Shoulder sling/immobilizer Precaution Booklet Issued: Yes (comment) Recall of Precautions/Restrictions: Intact Required Braces or Orthoses: Sling Restrictions Weight Bearing Restrictions Per Provider Order: Yes RUE Weight Bearing Per Provider Order: Weight bearing as tolerated RLE Weight Bearing Per Provider Order: Weight bearing as tolerated LLE Weight Bearing Per Provider Order: Weight bearing as tolerated Other Position/Activity Restrictions: Per Dr. Dozier We will allow weightbearing to tolerance given his lower extremity paraplegia and need for his upper extremities General: Pt supine in bed upon OT arrival, agreeable to OT session.  Pain:  4/10 pain reported in shoulder, activity, intermittent rest breaks, distractions provided for pain management, pt reports tolerable to proceed.   Exercises: OT providing skilled intervention for shoulder ROM, NMRE and strength. Pt in various positions on bed in order to comlete exercises. OT providing passive stretching before in preparation for activities. Pt completing the following stretches/exercises with no pain reported: -side lying AAROM shoulder  flexion -prone scapular protraction/retraction propped on elbows -prolonged stretch in supine for multiple trials -pendulum swings (anterior->posterior, side to side and clockwise/counter clockwise  Pt tolerated very well and was able to complete 30* of active shoulder flexion, which he has not been able to do before! Pt very pleased with progress of shoulder and reported felt much better after exercises.   Pt supine in bed with bed alarm activated, 3 bed rails up per pt preference, call light within reach and 4Ps assessed.   Therapy/Group: Individual Therapy  Camie Hoe, OTD, OTR/L 06/09/2024, 4:22 PM

## 2024-06-09 NOTE — Progress Notes (Signed)
 Physical Therapy Session Note  Patient Details  Name: Joseph [name] MRN: [id number] Date of Birth: 24-Dec-1980  Today's Date: 06/09/2024 PT Individual Time: 0900-1015 PT Individual Time Calculation (min): 75 min   Short Term Goals: Week 1:  PT Short Term Goal 1 (Week 1): Pt will perform sit to supine transfers with min A PT Short Term Goal 2 (Week 1): Pt will perform sit <> stand transfers with CGA PT Short Term Goal 3 (Week 1): Pt will perform bed <> WC transfers with CGA  Skilled Therapeutic Interventions/Progress Updates:    Pt seated in w/c on arrival and agreeable to therapy. Pt reports pain largely controlled, but increased with activity, premedicated. Rest and positioning provided as needed.   Pt propelled w/c throughout session for endurance and functional mobility. Switched chair for wider size more like what will be ordered for him. Pt then participated in step ups on 3 stair with BIL hand rails for LE strength and endurance and in prep for stair navigation at home. Performed xx 10 BIL, and then 3 x 5 BIL. Required extended rest breaks for fatigue management. Discussed gas tank analogy for energy conservation, pt expressed understanding. Pt then propelled w/c with BLE for hamstring strength and endurance. Pt returned to bed after session with CGA and RW. Returned to bed with supervision, was left with all needs in reach and alarm active.   Therapy Documentation Precautions:  Precautions Precautions: Fall, Back, Shoulder Type of Shoulder Precautions: WBAT R UE Shoulder Interventions: Shoulder sling/immobilizer Precaution Booklet Issued: Yes (comment) Recall of Precautions/Restrictions: Intact Required Braces or Orthoses: Sling Restrictions Weight Bearing Restrictions Per Provider Order: Yes RUE Weight Bearing Per Provider Order: Weight bearing as tolerated RLE Weight Bearing Per Provider Order: Weight bearing as tolerated LLE Weight Bearing Per Provider Order: Weight  bearing as tolerated Other Position/Activity Restrictions: Per Dr. Dozier We will allow weightbearing to tolerance given his lower extremity paraplegia and need for his upper extremities General:      Therapy/Group: Individual Therapy  Joseph [name] 06/09/2024, 4:18 PM

## 2024-06-09 NOTE — Progress Notes (Signed)
 Patient ID: Joseph [name], male   DOB: [birth year], 42 y.o.   MRN: [id number]   SW left message for [place] [facility] about CAP/DA referral.   [id number] spoke with Vernell Meadows CM Supervisor- Case Management Services with [facility] [phone]) to discuss services offered through their agency in connection with [facility]. States services include  assist with care coordination for behavioral plan or coordinating to a PCP, Assist with rent and utility assistance when [facility] begins to offer;  Chronic MH and substance abuse services, and transportation. She will submit CAP/DA application and will completed a 1939 form for intense substance abuse/MH services. SW provided contact information for pt mother, and she intends to follow-up.   Graeme Jude, MSW, LCSW Office: [phone] Cell: [phone] Fax: [phone]

## 2024-06-09 NOTE — Plan of Care (Signed)
°  Problem: Consults Goal: RH SPINAL CORD INJURY PATIENT EDUCATION Description:  See Patient Education module for education specifics.  Outcome: Progressing   Problem: SCI BOWEL ELIMINATION Goal: RH STG MANAGE BOWEL WITH ASSISTANCE Description: STG Manage Bowel with supervision Assistance. Outcome: Progressing   Problem: SCI BLADDER ELIMINATION Goal: RH STG MANAGE BLADDER WITH ASSISTANCE Description: STG Manage Bladder With supervision Assistance Outcome: Progressing   Problem: RH SKIN INTEGRITY Goal: RH STG SKIN FREE OF INFECTION/BREAKDOWN Description: Manage skin free from infection with supervision assistance Outcome: Progressing   Problem: RH SAFETY Goal: RH STG ADHERE TO SAFETY PRECAUTIONS W/ASSISTANCE/DEVICE Description: STG Adhere to Safety Precautions With supervision Assistance/Device. Outcome: Progressing   Problem: RH PAIN MANAGEMENT Goal: RH STG PAIN MANAGED AT OR BELOW PT'S PAIN GOAL Description: <4 w/ prns Outcome: Progressing   Problem: RH KNOWLEDGE DEFICIT SCI Goal: RH STG INCREASE KNOWLEDGE OF SELF CARE AFTER SCI Outcome: Progressing

## 2024-06-09 NOTE — Progress Notes (Signed)
 Occupational Therapy Session Note  Patient Details  Name: Joseph [name] MRN: [id number] Date of Birth: February 15, 1981  Today's Date: 06/09/2024 OT Individual Time: 0700-0800 OT Individual Time Calculation (min): 60 min    Short Term Goals: Week 1:  OT Short Term Goal 1 (Week 1): Pt will complete sit  > stand with CGA OT Short Term Goal 2 (Week 1): Pt will complete toileting tasks with mod A OT Short Term Goal 3 (Week 1): Pt will complete functional mobility into the bathroom with CGA using the RW OT Short Term Goal 4 (Week 1): Pt will complete LB dressing with LRAD with min A  Skilled Therapeutic Interventions/Progress Updates:    Pt resting in bed upon arrival and agreeable to therapy. Skilled OT intervention with focus on bed mobility, sit<>stand, functional transfers, standing balance, BADLs w/c level at sink, and BUE therex for general conditioning/joint mobility to increase independence with BADLs. Supine>sit EOB with supervision. Sit<>stand and transfer to w/c (RW) with supervision. Pt required assistance to don socks (without AE) but completed all other bathing/dressing tasks with close supervision using AE appropriately. Pt encouraged to take rest breaks during BADLs. Reviewed energy conservation strategies. UBE 2x5 mins level 1 in opposite directions primarily for RUE shoulder joint mobility and general conditioning. Pt returned to room and remained in w/c with all needs within reach.   Therapy Documentation Precautions:  Precautions Precautions: Fall, Back, Shoulder Type of Shoulder Precautions: WBAT R UE Shoulder Interventions: Shoulder sling/immobilizer Precaution Booklet Issued: Yes (comment) Recall of Precautions/Restrictions: Intact Required Braces or Orthoses: Sling Restrictions Weight Bearing Restrictions Per Provider Order: Yes RUE Weight Bearing Per Provider Order: Weight bearing as tolerated RLE Weight Bearing Per Provider Order: Weight bearing as tolerated LLE  Weight Bearing Per Provider Order: Weight bearing as tolerated Other Position/Activity Restrictions: Per Dr. Dozier We will allow weightbearing to tolerance given his lower extremity paraplegia and need for his upper extremities   Pain: Pt c/o 5/10 Rt shoulder pain; meds received prior to therapy, AROM, and repositioning   Therapy/Group: Individual Therapy  Joseph [name] 06/09/2024, 9:04 AM

## 2024-06-09 NOTE — Progress Notes (Signed)
 PATIENT ID: Joseph [name]  MRN: [id number]  DOB/AGE:  43-Feb-1982 / 43 y.o.      Subjective: Patient resting comfortably in room. Reports mild discomfort in the right shoulder. Notes some stiffness in the right shoulder.   Objective: Vital signs in last 24 hours: Temp:  [97.5 F (36.4 C)-99.1 F (37.3 C)] 97.7 F (36.5 C) (12/18 0457) Pulse Rate:  [91-105] 91 (12/18 0457) Resp:  [17-19] 19 (12/18 0457) BP: (124-132)/(67-90) 132/83 (12/18 0457) SpO2:  [95 %] 95 % (12/18 0457)  Intake/Output from previous day: 12/17 0701 - 12/18 0700 In: 720 [P.O.:720] Out: 1200 [Urine:1200] Intake/Output this shift: Total I/O In: 240 [P.O.:240] Out: 200 [Urine:200]  Recent Labs    06/09/24 0540  HGB 10.1*   Recent Labs    06/09/24 0540  WBC 5.1  RBC 3.49*  HCT 32.5*  PLT PLATELET CLUMPS NOTED ON SMEAR, UNABLE TO ESTIMATE   Recent Labs    06/09/24 0540  NA 137  K 4.1  CL 101  CO2 30  BUN 15  CREATININE 0.66  GLUCOSE 111*  CALCIUM 8.9   No results for input(s): LABPT, INR in the last 72 hours.  Physical Exam: Neurologically intact Sensation intact distally Intact pulses distally Incision: incisions healed No cellulitis present Able to move right hand, wrist, elbow without difficulty FF right shoulder 80 degrees and ER 30 degrees.   Assessment/Plan: s/p right humerus IM nail   Patient can use the sling for comfort and can be weight bearing as tolerated RUE. General ROM of the right shoulder is ok and encouraged. Follow up in 3 weeks with Dr Dozier.    Today's  total administered Morphine  Milligram Equivalents: 140 Yesterday's total administered Morphine  Milligram Equivalents: 375  Simrit Gohlke L. [place], PA-C 06/09/2024, 12:19 PM

## 2024-06-10 ENCOUNTER — Ambulatory Visit: Payer: MEDICAID

## 2024-06-10 ENCOUNTER — Other Ambulatory Visit: Payer: Self-pay

## 2024-06-10 DIAGNOSIS — Z510 Encounter for antineoplastic radiation therapy: Secondary | ICD-10-CM | POA: Diagnosis not present

## 2024-06-10 DIAGNOSIS — F418 Other specified anxiety disorders: Secondary | ICD-10-CM

## 2024-06-10 LAB — RAD ONC ARIA SESSION SUMMARY
Course Elapsed Days: 8
Plan Fractions Treated to Date: 2
Plan Fractions Treated to Date: 7
Plan Prescribed Dose Per Fraction: 3 Gy
Plan Prescribed Dose Per Fraction: 4 Gy
Plan Total Fractions Prescribed: 10
Plan Total Fractions Prescribed: 5
Plan Total Prescribed Dose: 20 Gy
Plan Total Prescribed Dose: 30 Gy
Reference Point Dosage Given to Date: 21 Gy
Reference Point Dosage Given to Date: 8 Gy
Reference Point Session Dosage Given: 3 Gy
Reference Point Session Dosage Given: 4 Gy
Session Number: 7

## 2024-06-10 MED ORDER — METHADONE HCL 5 MG PO TABS
15.0000 mg | ORAL_TABLET | Freq: Three times a day (TID) | ORAL | Status: DC
  Administered 2024-06-10 – 2024-06-13 (×9): 15 mg via ORAL
  Filled 2024-06-10 (×9): qty 1

## 2024-06-10 MED ORDER — OXYCODONE HCL 5 MG PO TABS
30.0000 mg | ORAL_TABLET | ORAL | Status: DC | PRN
  Administered 2024-06-10 – 2024-06-16 (×24): 30 mg via ORAL
  Filled 2024-06-10 (×24): qty 6

## 2024-06-10 NOTE — Progress Notes (Signed)
 "                                                                                                                                                        Daily Progress Note   Patient Name: Joseph [name]       Date: 06/10/2024 DOB: [birth year]  Age: 43 y.o. MRN#: [id number] Attending Physician: Joseph Bouchard, MD Primary Care Physician: Patient, No Pcp Per Admit Date: 06/02/2024  Reason for Consultation/Follow-up: Pain control  Subjective: Medical records reviewed including progress notes, labs, imaging, MAR. Patient assessed at the bedside.  He was eating his lunch, reports continued improvement in his overall pain level. No visitors present.  Per MAR, he has required 5 doses of as needed oxycodone  in the past 24 hours.  Created space and opportunity for patient's thoughts and feelings on patient's current illness. He is very happy to hear about his good prognosis after talking to oncology earlier today.  His understanding is that he should have no problems after surgical intervention to remove his cancer, which will be after discharge.  I reviewed his EKG results from today and he is glad to hear that he has no concerning effects noted due to methadone .  We discussed plan to increase methadone  dosage today with next dose increase anticipated on Monday at the soonest.  He is agreeable to meeting again with PMT when I am back on service at that time.  Encouraged him to reach out should needs arise in the interim.  Questions and concerns addressed. PMT will continue to support holistically.   Length of Stay: 8   Physical Exam Vitals and nursing note reviewed.  Constitutional:      General: He is not in acute distress. HENT:     Head: Normocephalic and atraumatic.  Cardiovascular:     Rate and Rhythm: Normal rate.  Pulmonary:     Effort: Pulmonary effort is normal. No respiratory distress.  Skin:    General: Skin is warm and dry.  Neurological:     Mental Status: He is alert and  oriented to person, place, and time.  Psychiatric:        Mood and Affect: Mood normal.        Behavior: Behavior normal.            Vital Signs: BP 128/79 (BP Location: Right Wrist)   Pulse 97   Temp 97.9 F (36.6 C)   Resp 16   Ht 5' 9 (1.753 m)   Wt (!) 148.7 kg   SpO2 96%   BMI 48.41 kg/m  SpO2: SpO2: 96 % O2 Device: O2 Device: Room Air O2 Flow Rate:    Palliative Care Assessment & Plan   Patient Profile: 43 y.o. 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    Patient was admitted 11/5-11/24 for post-op management of posterior T1-3 instrumentation fusion T2 corpectomy for resection of tumor, acute PE, thyroid  biopsy confirming papillary carcinoma, acute blood loss anemia due to procedure. He was then in CIR from 11/24-11/28, though participation in therapy was limited due to an acute fracture/metastasis. Patient was seen by PMT for pain control during readmission 11/29-12/11 for pathological fracture of right humerus. He has been in CIR since then with upcoming tumor board to discuss thyroidectomy.    PMT has been consulted to assist with acute cancer-related pain management.  Assessment: Acute on chronic right shoulder pain due to pathological fracture, improving Metastatic thyroid  cancer  Recommendations/Plan: Increase methadone  to 15 mg p.o. every 8 hours EKG ordered today to assess for QTc interval.  Within normal limits Decreased frequency of oxycodone  30 mg p.o. to every 4 hours as needed for breakthrough pain Continue Tylenol  1000 mg p.o. 3 times daily Psychosocial and emotional support provided PMT will see again on 12/22 for further adjustments of pain medications   Prognosis:  Unable to determine  Discharge  Planning: Home with Palliative Services   Care plan was discussed with patient         Terriana Barreras [name], PA-C  Palliative Medicine Team Team phone # [phone]  Thank you for allowing the Palliative Medicine Team to assist in the care of this patient. Please utilize secure chat with additional questions, if there is no response within 30 minutes please call the above phone number.  Palliative Medicine Team providers are available by phone from 7am to 7pm daily and can be reached through the team cell phone.  Should this patient require assistance outside of these hours, please call the patient's attending physician.    Time Total: 35  Visit consisted of counseling and education dealing with the complex and emotionally intense issues of symptom management and palliative care in the setting of serious and potentially life-threatening illness. Greater than 50% of this time was spent counseling and coordinating care related to the above assessment and plan.  Personally spent 35 minutes in patient care including extensive chart review (labs, imaging, progress/consult notes, vital signs), medically appropraite exam, discussed with treatment team, education to patient, family, and staff, documenting clinical information, medication review and management, coordination of care, and available advanced directive documents.   "

## 2024-06-10 NOTE — Progress Notes (Signed)
 "                                                        PROGRESS NOTE   Subjective/Complaints:  Pt reports good today - in shower pain controlled overall  Had BM today ROS: Per HPI   Pt denies SOB, abd pain, CP, N/V/C/D, and vision changes    Objective:   No results found.  Recent Labs    06/09/24 0540  WBC 5.1  HGB 10.1*  HCT 32.5*  PLT PLATELET CLUMPS NOTED ON SMEAR, UNABLE TO ESTIMATE    Recent Labs    06/09/24 0540  NA 137  K 4.1  CL 101  CO2 30  GLUCOSE 111*  BUN 15  CREATININE 0.66  CALCIUM 8.9    Intake/Output Summary (Last 24 hours) at 06/10/2024 1905 Last data filed at 06/10/2024 1432 Gross per 24 hour  Intake 120 ml  Output 1700 ml  Net -1580 ml        Physical Exam: Vital Signs Blood pressure (!) 140/74, pulse 98, temperature 98.9 F (37.2 C), temperature source Oral, resp. rate 20, height 5' 9 (1.753 m), weight (!) 148.7 kg, SpO2 98%.       General: awake, alert, appropriate, sitting in shower with OT in room;  NAD HENT: conjugate gaze; oropharynx moist CV: regular rate; no JVD Pulmonary: CTA B/L; no W/R/R- good air movement GI: soft, NT, ND, (+)BS Psychiatric: appropriate- interactive Neurological: Ox3   Extremities: RUE not in sling this AM- no change- very TTP LE swelling to ankles 2+ B/L- looks stable today-   PRIOR EXAMS: Musculoskeletal:     Cervical back: Neck supple.     Comments: LUE 5/5 RUE- at least 3/5 proximally; and 5-/5 distally- limited by pain and surgery of proximal humerus RLE- HF 3-/5; KE/KF 4+/5; DF 4/5 and PF 3-/5 LLE- HF 3+/5; KE/KF 5-/5 and DF/PF 5-/5  Skin:    General: Skin is warm and dry.     Comments: Bandage seen on R proximal arm- c/d/I Didn't assess thoracic incision- due to pt's sleepiness-didn't turn over  Neurological:     Comments: 5-6 beats clonus B/L No hoffman's MAS of 1 in LE's- much better Intact to light touch in all 4 extremities   Assessment/Plan: 1. Functional deficits  which require 3+ hours per day of interdisciplinary therapy in a comprehensive inpatient rehab setting. Physiatrist is providing close team supervision and 24 hour management of active medical problems listed below. Physiatrist and rehab team continue to assess barriers to discharge/monitor patient progress toward functional and medical goals  Care Tool:  Bathing    Body parts bathed by patient: Right arm, Chest, Abdomen, Front perineal area, Right upper leg, Left upper leg, Right lower leg, Left lower leg, Face   Body parts bathed by helper: Buttocks     Bathing assist Assist Level: Minimal Assistance - Patient > 75%     Upper Body Dressing/Undressing Upper body dressing   What is the patient wearing?: Pull over shirt    Upper body assist Assist Level: Supervision/Verbal cueing    Lower Body Dressing/Undressing Lower body dressing      What is the patient wearing?: Pants     Lower body assist Assist for lower body dressing: Supervision/Verbal cueing     Toileting Toileting  Toileting assist Assist for toileting: Moderate Assistance - Patient 50 - 74%     Transfers Chair/bed transfer  Transfers assist     Chair/bed transfer assist level: Contact Guard/Touching assist     Locomotion Ambulation   Ambulation assist      Assist level: Contact Guard/Touching assist Assistive device: Walker-rolling Max distance: 65ft   Walk 10 feet activity   Assist     Assist level: Contact Guard/Touching assist Assistive device: Walker-rolling   Walk 50 feet activity   Assist Walk 50 feet with 2 turns activity did not occur: Safety/medical concerns (weakness/fatigue/pain)  Assist level: Contact Guard/Touching assist Assistive device: Walker-rolling    Walk 150 feet activity   Assist Walk 150 feet activity did not occur: Safety/medical concerns (weakness/fatigue/pain)         Walk 10 feet on uneven surface  activity   Assist Walk 10 feet on uneven  surfaces activity did not occur: Safety/medical concerns (weakness/fatigue/pain)         Wheelchair     Assist Is the patient using a wheelchair?: Yes Type of Wheelchair: Manual    Wheelchair assist level: Maximal Assistance - Patient 25 - 49% (B LE and L UE) Max wheelchair distance: 10 ft    Wheelchair 50 feet with 2 turns activity    Assist        Assist Level: Total Assistance - Patient < 25%   Wheelchair 150 feet activity     Assist      Assist Level: Total Assistance - Patient < 25%   Blood pressure (!) 140/74, pulse 98, temperature 98.9 F (37.2 C), temperature source Oral, resp. rate 20, height 5' 9 (1.753 m), weight (!) 148.7 kg, SpO2 98%.   Medical Problem List and Plan: 1. 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              -patient may  shower             -ELOS/Goals: ~ 14 days- supervision             Admit to CIR Appropriate for CIR- didn't finish first time due to R humeral pathological fx due to Thyroid  cancer mets  Tumor board hopefully meeting soon- haven't heard from Oncology  D/c 12/30  needs ACE wraps- written for- educated pt and nursing that needs them for swelling  Con't CIR PT and OT 2.  Antithrombotics: -DVT/anticoagulation:  Mechanical: Sequential compression devices, below knee Bilateral lower extremities Pharmaceutical: Eliquis  5mg  BID due to PE 11/10- RUL segmental PE             -antiplatelet therapy: N/A   3. Pain Management: Robaxin  750 TID AC/HS, Lyrica  50 mg TID, Methadone  10 mg TID. Flexeril  10mg  TID, lidoderm  patches. Oxycodone  30 mg q3 hours as needed for breakthrough pain. Palliative care to follow.  12/16- Called Palliative care- will see if can increase Methadone  with goal to reduce Oxycodone  - see if can move Oxy to q4 hours? con't regimen for now, but called and placed consult 12/18- Methadone  up to 12.5 mg q8 hours and Oxycodone  30  mg q3 hours- 5 dose sin last 24 hours- they also started scheduled Tylenol  and stopped Robaxin - weaning flexeril  12/19- increased methadone  today- on 15 methadone  q8 hours- and Oxy 30 mg q4 hours- to change dosing Monday 4. Mood/Behavior/Sleep: LCSW to follow for evaluation and support when available.              -  antipsychotic agents: n/a   -Trazodone  PRN  5. Neuropsych/cognition: This patient is capable of making decisions on his own behalf.   6. Skin/Wound Care: Routine pressure relief measures. Monitor incisions   7. Fluids/Electrolytes/Nutrition: Monitor I&O and weight. Follow up labs CBC/CMP, continue vitamins/supplements.    8. 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 Completed course of dexamethasone . Patient to follow up outpatient with ENT, endocrinology and radiation oncology. ENT plan to discuss at multidisciplinary tumor board on 06/08/24.             - Will likely need Thyroidectomy and RAI-waiting for tumor board  12/16-12/17 should occur at tumor board 12/17 - waiting to hear from Oncology  12/18- waiting for note form Oncology -will reach out about it 9. Pathological right proximal humerus fracture: s/p IMR 11/29. WBAT in RUE due to needing it- use platform walker.  Ortho follow up in 2 weeks for recheck and updated xrays.  -Patient to remain in sling.   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 Patient underwent instrumentation and fusion on prior admission. Radiation Onc plans 2 weeks of radiotherapy to thoracic spine starts 06/02/24- right humerus on 06/09/24.   12/19- to finish Radiation completely by 12/24 11. Hx of RUL segmental  PE: dx on 11/6 provoked secondary to metastatic cancer. Continue Eliquis  for 3-6 months.   12. Incomplete Paraplegia/Neurogenic bladder: Monitor bladder scan with PVR  12/12- will order in/out caths and bladder scans q6 hours- to  see if needs cathing -12/13-14/25 no cathing overnight, urinating well   12/16- PVR's 4 to 126 cc in last 24 hours  12/17- Will start Flomax - we both agree he's not emptying all the time-  0.4 mg q supper- hopefully will help frequency  12/18- no PVR's- will reorder since one in last 24 hours  12/19- PVRs 98-110- doing a little better- con't flomax  13. Chronic normocytic anemia: Stable, continue Niferex 150 mg daily    14. Constipation/mild neurogenic bowel: Continue Miralax   daily and Senokot S 2 tabs BID  12/19- LBM this AM- no additional intervention  15. Polysubstance use disorder- will have palliative care f/u with meds after d/c for wean   12/16- called palliative care to help with this 16. LE edema B/L  12/17- will order ACE wraps and d/w pt elevating his legs to level of heart when not in therapy  12/18- d/w nursing-    I spent a total of 35   minutes on total care today- >50% coordination of care- due to  D/w pt, therapy and review of notes- also d/w nursing and reviewed B/B and vitals  LOS: 8 days A FACE TO FACE EVALUATION WAS PERFORMED  Konstantin Lehnen 06/10/2024, 7:05 PM     "

## 2024-06-10 NOTE — Plan of Care (Signed)
" °  Problem: Consults Goal: RH SPINAL CORD INJURY PATIENT EDUCATION Description:  See Patient Education module for education specifics.  Outcome: Progressing   Problem: SCI BOWEL ELIMINATION Goal: RH STG MANAGE BOWEL WITH ASSISTANCE Description: STG Manage Bowel with supervision Assistance. Outcome: Progressing   Problem: SCI BLADDER ELIMINATION Goal: RH STG MANAGE BLADDER WITH ASSISTANCE Description: STG Manage Bladder With supervision Assistance Outcome: Progressing   Problem: RH SKIN INTEGRITY Goal: RH STG SKIN FREE OF INFECTION/BREAKDOWN Description: Manage skin free from infection with supervision assistance Outcome: Progressing   Problem: RH SAFETY Goal: RH STG ADHERE TO SAFETY PRECAUTIONS W/ASSISTANCE/DEVICE Description: STG Adhere to Safety Precautions With supervision Assistance/Device. Outcome: Progressing   Problem: RH PAIN MANAGEMENT Goal: RH STG PAIN MANAGED AT OR BELOW PT'S PAIN GOAL Description: <4 w/ prns Outcome: Progressing   Problem: RH KNOWLEDGE DEFICIT SCI Goal: RH STG INCREASE KNOWLEDGE OF SELF CARE AFTER SCI Outcome: Progressing   "

## 2024-06-10 NOTE — Progress Notes (Signed)
 Physical Therapy Weekly Progress Note  Patient Details  Name: Joseph [name] MRN: [id number] Date of Birth: Feb 13, 1981  Beginning of progress report period: June 03, 2024 End of progress report period: June 10, 2024  Today's Date: 06/10/2024 PT Individual Time: 1000-1100, 1300-1415 PT Individual Time Calculation (min): 60 min, 75 min   Patient has met 3 of 3 short term goals.  Pt is making good progress toward goals, he is ambulating 120 ft with CGA but still requires w/c follow for safety as pt fatigues quickly. Pt is able to perform transfers with RW and CGA consistently.   Patient continues to demonstrate the following deficits muscle weakness, decreased cardiorespiratoy endurance, and decreased standing balance and decreased balance strategies and therefore will continue to benefit from skilled PT intervention to increase functional independence with mobility.  Patient progressing toward long term goals..  Continue plan of care.  PT Short Term Goals Week 1:  PT Short Term Goal 1 (Week 1): Pt will perform sit to supine transfers with min A PT Short Term Goal 1 - Progress (Week 1): Met PT Short Term Goal 2 (Week 1): Pt will perform sit <> stand transfers with CGA PT Short Term Goal 2 - Progress (Week 1): Met PT Short Term Goal 3 (Week 1): Pt will perform bed <> WC transfers with CGA PT Short Term Goal 3 - Progress (Week 1): Met Week 2:  PT Short Term Goal 1 (Week 2): Pt will ambulate 50 ft without need for w/c follow PT Short Term Goal 2 (Week 2): Pt will tolerate x 10 min activity without rest break PT Short Term Goal 3 (Week 2): Pt will perform STS with supervision without UE support consistently  Skilled Therapeutic Interventions/Progress Updates:  Session 1: Pt seated in w/c on arrival and agreeable to therapy. Pt reports pain controlled at this time. Pt propelled w/c with BUE throughout session for endurance and functional mobility. Pt ambulated ~120 ft x 2 during  session with RW and CGA, w/c follow for safety. Cues for upright posture and RW proximity. Pt then performed 3 x 8 Sit to stand with 3kg ball held at chest for LE strength and endurance. Pt requires longer rest breaks d/t fatigue. Pt returned to room and to bed with CGA, pt remained with BLE elevated d/t noted edema in BLE. was left with all needs in reach and alarm active.   Session 2: pt received in bed and agreeable to therapy. Pt reports pain, up there in level, but premedicated. Rest and positioning provided as needed.   Pt performed bed mobility from flat bed with supervision and bed features.  Pt ambulated ~120 ft with RW and CGA, w/c follow for safety in case of fatigue.   Pt set up in //bars for step ups on 6 step with LLE and 4 step with RLE. Progressed to step up with cone tap, 4 x 6-8 BLE. Discussed prognosis and goals of rehab stay, pt in agreement with need for w/c at d/c especially related to cancer treatments and associated fatigue.   Pt pushed w/c to day room for endurance and functional mobility. Pt with fatigue, but able to propel whole distance. Pt then used nustep 2 x 5 min with seated rest break, double hills load interval 6-13. Pt fatigued, but able to safely transfer to return to w/c. Pt returned to bed after session with CGA, was left with all needs in reach and alarm active.   Therapy Documentation Precautions:  Precautions Precautions: Fall,  Back, Shoulder Type of Shoulder Precautions: WBAT R UE Shoulder Interventions: Shoulder sling/immobilizer Precaution Booklet Issued: Yes (comment) Recall of Precautions/Restrictions: Intact Required Braces or Orthoses: Sling Restrictions Weight Bearing Restrictions Per Provider Order: No RUE Weight Bearing Per Provider Order: Weight bearing as tolerated RLE Weight Bearing Per Provider Order: Weight bearing as tolerated LLE Weight Bearing Per Provider Order: Weight bearing as tolerated Other Position/Activity Restrictions:  Per Dr. Dozier We will allow weightbearing to tolerance given his lower extremity paraplegia and need for his upper extremities General:      Therapy/Group: Individual Therapy  Joseph [name] 06/10/2024, 12:58 PM

## 2024-06-10 NOTE — Progress Notes (Signed)
 Occupational Therapy Session Note  Patient Details  Name: Joseph [name] MRN: [id number] Date of Birth: Dec 18, 1980  Today's Date: 06/10/2024 OT Individual Time: 9299-9188 OT Individual Time Calculation (min): 71 min    Short Term Goals: Week 1:  OT Short Term Goal 1 (Week 1): Pt will complete sit  > stand with CGA OT Short Term Goal 2 (Week 1): Pt will complete toileting tasks with mod A OT Short Term Goal 3 (Week 1): Pt will complete functional mobility into the bathroom with CGA using the RW OT Short Term Goal 4 (Week 1): Pt will complete LB dressing with LRAD with min A  Skilled Therapeutic Interventions/Progress Updates:    Pt sleeping upon arrival but easily aroused. Skilled OT intervention with focus on bed mobility, standing balance, functional amb with RW, bathing at shower level, dressing with sit<>stand from w/c, BUE therex for joint mobility/health, and activity tolerance to increase independence with BADLs. Supine>sit EOB with supervision using bed functions, amb with RW in room to acces bathroom and return to w/c with supervision. Pt completed bathing tasks with min A for assist with bathing buttocks while standing. Sit<>stand from TTB with supervision using grab bars. Pt used reacher to assist with donning pants. Standing balance with supervision while pulling pants over hips. Pt transitioned to ortho gym for BUE therex. UBE 2x5 mins level 1 in opposite directions for joint mobility and activity tolerance. Pt returned to room and remained in w/c with all needs within reach.   Therapy Documentation Precautions:  Precautions Precautions: Fall, Back, Shoulder Type of Shoulder Precautions: WBAT R UE Shoulder Interventions: Shoulder sling/immobilizer Precaution Booklet Issued: Yes (comment) Recall of Precautions/Restrictions: Intact Required Braces or Orthoses: Sling Restrictions Weight Bearing Restrictions Per Provider Order: Yes RUE Weight Bearing Per Provider Order:  Weight bearing as tolerated RLE Weight Bearing Per Provider Order: Weight bearing as tolerated LLE Weight Bearing Per Provider Order: Weight bearing as tolerated Other Position/Activity Restrictions: Per Dr. Dozier We will allow weightbearing to tolerance given his lower extremity paraplegia and need for his upper extremities   Pain: Pt c/o 4/10 Rt shoulder pain; meds admin prior to therapy and AROM, rest and repositioning   Therapy/Group: Individual Therapy  [name] 06/10/2024, 8:15 AM

## 2024-06-10 NOTE — Consult Note (Signed)
 Neuropsychological Consultation Comprehensive Inpatient Rehab   Patient:   Joseph [name]   DOB:   June 22, 1981  MR Number:  [id number]  Location:  [facility] [address] CENTER [address] [place] [address] Dept: [phone] Loc: [phone]           Date of Service:   06/10/2024  Start Time:   9 AM End Time:   10 AM  Provider/Observer:  Norleen Asa, Psy.D.       Clinical Neuropsychologist       Billing Code/Service: [phone]  Reason for Service:    ROLONDO PIERRE is a 43 year old male with a history of metastatic papillary thyroid  carcinoma, status post-T1-3 posterior instrumentation and fusion, and right humerus intramedullary nailing. He is currently receiving palliative radiation therapy and is admitted for a comprehensive rehabilitation program due to decreased functional mobility. This initial assessment was conducted to evaluate his cognitive and emotional status.  Today is a follow-up appointment with the patient as I saw him 3 days ago.  Patient has had new information provided to him by oncology.  Presenting Concerns: The patient reports significant fatigue following radiation therapy sessions, which he describes as wiping me out. He also reports shakiness in his knees, particularly the right, which he feels is weaker than the left and has a tendency to buckle. He expresses some anxiety and fear of falling, particularly when transferring to the radiation table, but reports trusting the therapy staff.  Today, however, the patient was in much improved status/mood state having spoken with oncology prior to our visit.  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 He also has a large tumor with a pathological fracture of the proximal right humerus, status  post-intramedullary nailing. He reports a history of 11 surgeries. - Psychiatric: History of anxiety and depression. History of polysubstance use disorder. - Medical: History of asthma. He is currently on Eliquis  for a right upper lobe segmental pulmonary embolism. He is receiving palliative radiation therapy to the spine and right humerus, which began on 06/02/2024. He is followed by palliative care for pain and symptom management.  Functional Capacity: Prior to his current hospitalization, the patient was independent and driving. He now requires minimal assistance with mobility and basic activities of daily living. He is participating in physical and occupational therapy to improve mobility, with a goal of being able to perform 75% of tasks independently with 25% assistance. He is using a wheelchair and a walker for mobility.  Behavioral Observation/Mental Status: Appeared well-groomed and was cooperative throughout the interview. Maintained good eye contact. No motor restlessness or agitation was observed. Speech rate and volume were within normal limits. Speech was spontaneous and coherent. Affect was appropriate to the content of the conversation, though he expressed some frustration and fear related to his physical limitations and medical procedures. Thought process was logical and goal-directed. No evidence of disorganization or psychosis. He demonstrated good insight into his medical situation and the rationale for his treatments. He is alert and oriented. Attention span appeared adequate for the interview. Recent and remote memory appear intact. Verbal fluency was within normal limits. Judgement appears intact.  Impression/DX:                          The patient is a 43 year old male with significant  medical complexity, including metastatic cancer and recent major surgeries, which are impacting his physical and emotional functioning. He is experiencing significant fatigue and some anxiety,  particularly related to mobility and fear of falling. He demonstrates good insight and motivation for rehabilitation. His mood appears euthymic, though he acknowledges the difficulty of his situation. He denies significant worsening in mood state and denies that mood state is interfering with therapy efforts. He is on no current psychotropic medications outside of the addition of trazodone  to aid with onset of sleep.  Patient has shown significant improvement in recent mood state after meeting with oncology around what they plan to do as far as treating his cancer after getting molecular pathology results back and how it will adjust their treatment going forward.  Patient reports he feels like the report was quite positive and has helped his overall mood state.     Electronically Signed   _______________________ Norleen Asa, Psy.D. Clinical Neuropsychologist

## 2024-06-11 NOTE — Progress Notes (Signed)
 Occupational Therapy Session Note  Patient Details  Name: Joseph [name] MRN: [id number] Date of Birth: August 02, 1980  Today's Date: 06/11/2024 OT Individual Time: 9054-8954 OT Individual Time Calculation (min): 60 min    Short Term Goals: Week 1:  OT Short Term Goal 1 (Week 1): Pt will complete sit  > stand with CGA OT Short Term Goal 2 (Week 1): Pt will complete toileting tasks with mod A OT Short Term Goal 3 (Week 1): Pt will complete functional mobility into the bathroom with CGA using the RW OT Short Term Goal 4 (Week 1): Pt will complete LB dressing with LRAD with min A  Skilled Therapeutic Interventions/Progress Updates:    Initial Encounter: The pt was watching TV at bed LOF at the time of arrival , inidcating a pain response of 5 on 0-10 for R shld pain. The pt went on to say that nursing had addressed his pain prior to my arrival. The pt also reported resting well during the night. The pt was in agreement with completing UB strengthening to improve the quality of his movement.   BADL Related Task: The pt was able to dress his UB with s/uA for an over head tee shirt.  The pt was able to donn his pants with MinA and socks at Dep. The pt was able to transfer from EOB to w/c LOF using the RW with CGA. The pt was able to transport to the sink for bathing his UB and completing grooming task for brushing  his teeth and hair with s/uA.. The patient was able donn his over head shirt with s/uA, he was MinA for his pants and Dep with his non-skid socks.   UB strengthening: The pt was able to wheel himself > 4ft in route to the gym, he was transported to the gym and was able complete the Nustep using BUE for 10 minutes in duration with 2 rest breaks.  At the end of the session, the pt was transported to his room and he remained at w/c LOFwith all additional needs addressed and his call light within reach.   Therapy Documentation Precautions:  Precautions Precautions: Fall, Back,  Shoulder Type of Shoulder Precautions: WBAT R UE Shoulder Interventions: Shoulder sling/immobilizer Precaution Booklet Issued: Yes (comment) Recall of Precautions/Restrictions: Intact Required Braces or Orthoses: Sling Restrictions Weight Bearing Restrictions Per Provider Order: No RUE Weight Bearing Per Provider Order: Weight bearing as tolerated RLE Weight Bearing Per Provider Order: Weight bearing as tolerated LLE Weight Bearing Per Provider Order: Weight bearing as tolerated Other Position/Activity Restrictions: Per Dr. Dozier We will allow weightbearing to tolerance given his lower extremity paraplegia and need for his upper extremities Therapy/Group: Individual Therapy  [name] 06/11/2024, 2:41 PM

## 2024-06-11 NOTE — Progress Notes (Addendum)
 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 He is undergoing radiation of both metastasis sites and physical therapy following surgical repair. Currently in inpatient rehab with possible discharge 12/30.  Patients case was reviewed at multidisciplinary tumor board on 06/08/2024. I reviewed imaging and pathology and discussed consensus recommendations with the patient today. Management options including surgery, radiation, and systemic therapy were reviewed.  I connected with  Charmaine GORMAN [place] on 06/11/2024 at 420pm by a telemedicine application and verified that I am speaking with the correct person using two identifiers. I discussed the limitations of evaluation and management by telemedicine. The patient expressed understanding and agreed to proceed.  No airway distress or voice changes.  Imaging review, no pathologic cervical lymphadenopathy on CT, no obvious tracheal invasion.  Summary and recommendations - Follow up with Dr. Reyes Alexander after discharge - Outpatient PET CT - Outpatient follow up with me  - Anticipate total thyroidectomy  All questions answered and understood plan.   Penne Croak 06/11/2024 4:28 PM Available on [employer] chat and [employer]  Department of Otolaryngology Contact Info: Otolaryngology [employer] including questions about appointments or questions: [phone] - If after normal business hours (Monday-Friday after 5PM or Weekends/Holidays), please call same number and follow prompts for Patient Access Line. There is a physician on call for urgent matters. For life threatening emergencies, please call 911

## 2024-06-11 NOTE — Plan of Care (Signed)
" °  Problem: Consults Goal: RH SPINAL CORD INJURY PATIENT EDUCATION Description:  See Patient Education module for education specifics.  Outcome: Progressing   Problem: SCI BOWEL ELIMINATION Goal: RH STG MANAGE BOWEL WITH ASSISTANCE Description: STG Manage Bowel with supervision Assistance. Outcome: Progressing   Problem: SCI BLADDER ELIMINATION Goal: RH STG MANAGE BLADDER WITH ASSISTANCE Description: STG Manage Bladder With supervision Assistance Outcome: Progressing   Problem: RH SKIN INTEGRITY Goal: RH STG SKIN FREE OF INFECTION/BREAKDOWN Description: Manage skin free from infection with supervision assistance Outcome: Progressing   Problem: RH SAFETY Goal: RH STG ADHERE TO SAFETY PRECAUTIONS W/ASSISTANCE/DEVICE Description: STG Adhere to Safety Precautions With supervision Assistance/Device. Outcome: Progressing   Problem: RH PAIN MANAGEMENT Goal: RH STG PAIN MANAGED AT OR BELOW PT'S PAIN GOAL Description: <4 w/ prns Outcome: Progressing   Problem: RH KNOWLEDGE DEFICIT SCI Goal: RH STG INCREASE KNOWLEDGE OF SELF CARE AFTER SCI Outcome: Progressing   "

## 2024-06-11 NOTE — Progress Notes (Signed)
 "                                                        PROGRESS NOTE   Subjective/Complaints:  Pt doing well, slept well, pain improving, LBM 2d ago per pt (last documented 12/17), urinating fine. No other complaints or concerns.   ROS: Per HPI   Pt denies SOB, abd pain, CP, N/V/C/D, and vision changes    Objective:   No results found.  Recent Labs    06/09/24 0540  WBC 5.1  HGB 10.1*  HCT 32.5*  PLT PLATELET CLUMPS NOTED ON SMEAR, UNABLE TO ESTIMATE    Recent Labs    06/09/24 0540  NA 137  K 4.1  CL 101  CO2 30  GLUCOSE 111*  BUN 15  CREATININE 0.66  CALCIUM 8.9    Intake/Output Summary (Last 24 hours) at 06/11/2024 1104 Last data filed at 06/11/2024 0900 Gross per 24 hour  Intake 120 ml  Output 1725 ml  Net -1605 ml        Physical Exam: Vital Signs Blood pressure 114/76, pulse 98, temperature 98 F (36.7 C), temperature source Oral, resp. rate 16, height 5' 9 (1.753 m), weight (!) 148.7 kg, SpO2 100%.   General: awake, alert, appropriate, resting comfortably in bed;  NAD HENT: conjugate gaze; oropharynx moist CV: regular rate; no JVD Pulmonary: CTA B/L; no W/R/R- good air movement GI: soft, NT, ND, (+)BS though a little hypoactive Psychiatric: appropriate- interactive Neurological: Ox3 Extremities: RUE not in sling this AM LE swelling to ankles 1-2+ B/L- looks stable   PRIOR EXAMS: Musculoskeletal:     Cervical back: Neck supple.     Comments: LUE 5/5 RUE- at least 3/5 proximally; and 5-/5 distally- limited by pain and surgery of proximal humerus RLE- HF 3-/5; KE/KF 4+/5; DF 4/5 and PF 3-/5 LLE- HF 3+/5; KE/KF 5-/5 and DF/PF 5-/5  Skin:    General: Skin is warm and dry.     Comments: Bandage seen on R proximal arm- c/d/I Didn't assess thoracic incision- due to pt's sleepiness-didn't turn over  Neurological:     Comments: 5-6 beats clonus B/L No hoffman's MAS of 1 in LE's- much better Intact to light touch in all 4 extremities    Assessment/Plan: 1. Functional deficits which require 3+ hours per day of interdisciplinary therapy in a comprehensive inpatient rehab setting. Physiatrist is providing close team supervision and 24 hour management of active medical problems listed below. Physiatrist and rehab team continue to assess barriers to discharge/monitor patient progress toward functional and medical goals  Care Tool:  Bathing    Body parts bathed by patient: Right arm, Chest, Abdomen, Front perineal area, Right upper leg, Left upper leg, Right lower leg, Left lower leg, Face   Body parts bathed by helper: Buttocks     Bathing assist Assist Level: Minimal Assistance - Patient > 75%     Upper Body Dressing/Undressing Upper body dressing   What is the patient wearing?: Pull over shirt    Upper body assist Assist Level: Supervision/Verbal cueing    Lower Body Dressing/Undressing Lower body dressing      What is the patient wearing?: Pants     Lower body assist Assist for lower body dressing: Supervision/Verbal cueing     Toileting Toileting    Toileting  assist Assist for toileting: Moderate Assistance - Patient 50 - 74%     Transfers Chair/bed transfer  Transfers assist     Chair/bed transfer assist level: Contact Guard/Touching assist     Locomotion Ambulation   Ambulation assist      Assist level: Contact Guard/Touching assist Assistive device: Walker-rolling Max distance: 31ft   Walk 10 feet activity   Assist     Assist level: Contact Guard/Touching assist Assistive device: Walker-rolling   Walk 50 feet activity   Assist Walk 50 feet with 2 turns activity did not occur: Safety/medical concerns (weakness/fatigue/pain)  Assist level: Contact Guard/Touching assist Assistive device: Walker-rolling    Walk 150 feet activity   Assist Walk 150 feet activity did not occur: Safety/medical concerns (weakness/fatigue/pain)         Walk 10 feet on uneven surface   activity   Assist Walk 10 feet on uneven surfaces activity did not occur: Safety/medical concerns (weakness/fatigue/pain)         Wheelchair     Assist Is the patient using a wheelchair?: Yes Type of Wheelchair: Manual    Wheelchair assist level: Maximal Assistance - Patient 25 - 49% (B LE and L UE) Max wheelchair distance: 10 ft    Wheelchair 50 feet with 2 turns activity    Assist        Assist Level: Total Assistance - Patient < 25%   Wheelchair 150 feet activity     Assist      Assist Level: Total Assistance - Patient < 25%   Blood pressure 114/76, pulse 98, temperature 98 F (36.7 C), temperature source Oral, resp. rate 16, height 5' 9 (1.753 m), weight (!) 148.7 kg, SpO2 100%.   Medical Problem List and Plan: 1. 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              -patient may  shower             -ELOS/Goals: ~ 14 days- supervision             Admit to CIR Appropriate for CIR- didn't finish first time due to R humeral pathological fx due to Thyroid  cancer mets  Tumor board hopefully meeting soon- haven't heard from Oncology  D/c 12/30 needs ACE wraps- written for- educated pt and nursing that needs them for swelling  Con't CIR PT and OT 2.  Antithrombotics: -DVT/anticoagulation:  Mechanical: Sequential compression devices, below knee Bilateral lower extremities Pharmaceutical: Eliquis  5mg  BID due to PE 11/10- RUL segmental PE             -antiplatelet therapy: N/A   3. Pain Management: Robaxin  750 TID AC/HS, Lyrica  50 mg TID, Methadone  10 mg TID. Flexeril  10mg  TID, lidoderm  patches. Oxycodone  30 mg q3 hours as needed for breakthrough pain. Palliative care to follow.  12/16- Called Palliative care- will see if can increase Methadone  with goal to reduce Oxycodone  - see if can move Oxy to q4 hours? con't regimen for now, but called and placed consult 12/18- Methadone   up to 12.5 mg q8 hours and Oxycodone  30 mg q3 hours- 5 dose sin last 24 hours- they also started scheduled Tylenol  and stopped Robaxin - weaning flexeril  12/19- increased methadone  today- on 15 methadone  q8 hours- and Oxy 30 mg q4 hours- to change dosing Monday  4. Mood/Behavior/Sleep: LCSW to follow for evaluation and support when available.              -  antipsychotic agents: n/a   -Trazodone  PRN  5. Neuropsych/cognition: This patient is capable of making decisions on his own behalf.   6. Skin/Wound Care: Routine pressure relief measures. Monitor incisions   7. Fluids/Electrolytes/Nutrition: Monitor I&O and weight. Follow up labs CBC/CMP, continue vitamins/supplements.    8. 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 Completed course of dexamethasone . Patient to follow up outpatient with ENT, endocrinology and radiation oncology. ENT plan to discuss at multidisciplinary tumor board on 06/08/24.             - Will likely need Thyroidectomy and RAI-waiting for tumor board 12/16-12/17 should occur at tumor board 12/17 - waiting to hear from Oncology  12/18- waiting for note form Oncology -will reach out about it  9. Pathological right proximal humerus fracture: s/p IMR 11/29. WBAT in RUE due to needing it- use platform walker.  Ortho follow up in 2 weeks for recheck and updated xrays.  -Patient to remain in sling. 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 Patient underwent instrumentation and fusion on prior admission. Radiation Onc plans 2 weeks of radiotherapy to thoracic spine starts 06/02/24- right humerus on 06/09/24.   12/19- to finish Radiation completely by 12/24  11. Hx of RUL segmental  PE: dx on 11/6 provoked secondary to metastatic cancer. Continue Eliquis  for 3-6 months.   12. Incomplete Paraplegia/Neurogenic bladder: Monitor bladder scan with PVR  12/12- will order  in/out caths and bladder scans q6 hours- to see if needs cathing -12/13-14/25 no cathing overnight, urinating well   12/16- PVR's 4 to 126 cc in last 24 hours 12/17- Will start Flomax - we both agree he's not emptying all the time-  0.4 mg q supper- hopefully will help frequency  12/18- no PVR's- will reorder since one in last 24 hours  12/19- PVRs 98-110- doing a little better- con't flomax -- stable 12/20  13. Chronic normocytic anemia: Stable, continue Niferex 150 mg daily    14. Constipation/mild neurogenic bowel: Continue Miralax   daily and Senokot S 2 tabs BID -06/11/24 no BM in 2d per pt, last time needed MoM 15ml... will intervene tomorrow if still no BM.   15. Polysubstance use disorder- will have palliative care f/u with meds after d/c for wean   12/16- called palliative care to help with this  16. LE edema B/L 12/17- will order ACE wraps and d/w pt elevating his legs to level of heart when not in therapy  12/18- d/w nursing-      LOS: 9 days A FACE TO FACE EVALUATION WAS PERFORMED  [address] 06/11/2024, 11:04 AM     "

## 2024-06-12 DIAGNOSIS — R600 Localized edema: Secondary | ICD-10-CM

## 2024-06-12 MED ORDER — FUROSEMIDE 20 MG PO TABS
20.0000 mg | ORAL_TABLET | Freq: Once | ORAL | Status: AC
  Administered 2024-06-12: 20 mg via ORAL
  Filled 2024-06-12: qty 1

## 2024-06-12 NOTE — Progress Notes (Signed)
 Occupational Therapy Session Note  Patient Details  Name: Joseph [name] MRN: [id number] Date of Birth: 1980-10-10  Today's Date: 06/12/2024 OT Individual Time: 9184-9084 OT Individual Time Calculation (min): 60 min    Short Term Goals: Week 1:  OT Short Term Goal 1 (Week 1): Pt will complete sit  > stand with CGA OT Short Term Goal 2 (Week 1): Pt will complete toileting tasks with mod A OT Short Term Goal 3 (Week 1): Pt will complete functional mobility into the bathroom with CGA using the RW OT Short Term Goal 4 (Week 1): Pt will complete LB dressing with LRAD with min A  Skilled Therapeutic Interventions/Progress Updates:    Skilled OT intervention with focus on bed mobility, functional amb with RW, bathing at shower level, dressing with sit<>stand, standing balance, activity tolerance, and safety awareness to increase independence with BADLs. Bed mobility with supervision using bed functions. Pt amb with RW to bathroom with supervision. Pt required assistance bathing buttocks but completed all other bathing tasks with supervision. Pt required assistance donning socks but completed all other dressing tasks with supervision using AE appropriately. Pt progressing well with ADLs. Pt requested to return to bed at end of session Sit>supine with supervision. Pt able to reposition self in bed with supervision. Pt remained in bed with all needs within reach.   Therapy Documentation Precautions:  Precautions Precautions: Fall, Back, Shoulder Type of Shoulder Precautions: WBAT R UE Shoulder Interventions: Shoulder sling/immobilizer Precaution Booklet Issued: Yes (comment) Recall of Precautions/Restrictions: Intact Required Braces or Orthoses: Sling Restrictions Weight Bearing Restrictions Per Provider Order: No RUE Weight Bearing Per Provider Order: Weight bearing as tolerated RLE Weight Bearing Per Provider Order: Weight bearing as tolerated LLE Weight Bearing Per Provider Order: Weight  bearing as tolerated Other Position/Activity Restrictions: Per Dr. Dozier We will allow weightbearing to tolerance given his lower extremity paraplegia and need for his upper extremities   Pain: Pt c/o 8/10 Rt shoulder pain; meds admin during session   Therapy/Group: Individual Therapy  [name] 06/12/2024, 10:16 AM

## 2024-06-12 NOTE — Progress Notes (Signed)
 Occupational Therapy Session Note  Patient Details  Name: Joseph [name] MRN: [id number] Date of Birth: January 10, 1981  Today's Date: 06/12/2024 OT Individual Time: 1345-1425 OT Individual Time Calculation (min): 40 min    Short Term Goals: Week 1:  OT Short Term Goal 1 (Week 1): Pt will complete sit  > stand with CGA OT Short Term Goal 2 (Week 1): Pt will complete toileting tasks with mod A OT Short Term Goal 3 (Week 1): Pt will complete functional mobility into the bathroom with CGA using the RW OT Short Term Goal 4 (Week 1): Pt will complete LB dressing with LRAD with min A  Skilled Therapeutic Interventions/Progress Updates:   Pt resting in bed upon arrival an agreeable to therapy. Skilled OT intervention with focus on standing balance, bed mobility, functional transfers, RUE/shoulder PROM/SROM for joint mobility, ongoing discharge planning, and safety awareness to increase independence with BADLs. Supine>sit EOB with supervision and pt using hospital bed functions. Sit<>stand and amb with RW to w/c with supervision. Pt engaged in standing activity at Wii for game of bowling using RUE for strengthening and standing balance. Pt able to stand for complete game with supervision. Pt with appropriate RUE shoulder AROM during game. Pt performed SROM using table to eliminate gravity during stretching in horizontal plane. OTA performed PROM for shoulder elevation and scap elevation. Pt performed SROM for shoulder elevation. Recommended pt perform stretching each morning and throughout day to maintain joint mobility. Reviewed LTGs and progress. Pt returned to room and remained in w/c with lunch on bedside table. All needs within reach.   Therapy Documentation Precautions:  Precautions Precautions: Fall, Back, Shoulder Type of Shoulder Precautions: WBAT R UE Shoulder Interventions: Shoulder sling/immobilizer Precaution Booklet Issued: Yes (comment) Recall of Precautions/Restrictions:  Intact Required Braces or Orthoses: Sling Restrictions Weight Bearing Restrictions Per Provider Order: No RUE Weight Bearing Per Provider Order: Weight bearing as tolerated RLE Weight Bearing Per Provider Order: Weight bearing as tolerated LLE Weight Bearing Per Provider Order: Weight bearing as tolerated Other Position/Activity Restrictions: Per Dr. Dozier We will allow weightbearing to tolerance given his lower extremity paraplegia and need for his upper extremities   Pain: Pt c/o 6/10 Lt shoulder pain; stretching and rest as appropriate   Therapy/Group: Individual Therapy  [name] 06/12/2024, 2:29 PM

## 2024-06-12 NOTE — Progress Notes (Signed)
 Physical Therapy Session Note  Patient Details  Name: Joseph [name] MRN: [id number] Date of Birth: 05/21/1981  Today's Date: 06/12/2024 PT Individual Time: 1500-1530 PT Individual Time Calculation (min): 30 min   Short Term Goals: Week 2:  PT Short Term Goal 1 (Week 2): Pt will ambulate 50 ft without need for w/c follow PT Short Term Goal 2 (Week 2): Pt will tolerate x 10 min activity without rest break PT Short Term Goal 3 (Week 2): Pt will perform STS with supervision without UE support consistently  Skilled Therapeutic Interventions/Progress Updates:    pt received in bed and agreeable to therapy. Pt reports pain manageable at this time, no interventions required.  Bed mobility with supervision, increased time d/t feeling mildly dizzy, but improved quickly. Stand pivot transfer to w/c with CGA and RW. Pt then propelled w/c ~120 ft for endurance and to raise BP via physical activity. Pt then ambulated ~ 80 ft with RW and CGA, w/c in tow for safety.  Pt performed alternated active assist BIL shoulder flexion x 5 and Sit to stand with no UE support x 10, x 4 bouts. Sit to stand with supervision. Pt extremely fatigued by end of activity, but able to perform increased shoulder flexion.  Pt ambulated to bathroom, ~90 ft and was able urinate in standing with CGA. Standing >3 min with CGA, fatigued but safe.   Pt returned to room and to bed, was left with all needs in reach and alarm active.   Therapy Documentation Precautions:  Precautions Precautions: Fall, Back, Shoulder Type of Shoulder Precautions: WBAT R UE Shoulder Interventions: Shoulder sling/immobilizer Precaution Booklet Issued: Yes (comment) Recall of Precautions/Restrictions: Intact Required Braces or Orthoses: Sling Restrictions Weight Bearing Restrictions Per Provider Order: No RUE Weight Bearing Per Provider Order: Weight bearing as tolerated RLE Weight Bearing Per Provider Order: Weight bearing as tolerated LLE  Weight Bearing Per Provider Order: Weight bearing as tolerated Other Position/Activity Restrictions: Per Dr. Dozier We will allow weightbearing to tolerance given his lower extremity paraplegia and need for his upper extremities General:      Therapy/Group: Individual Therapy  [name] 06/12/2024, 3:10 PM

## 2024-06-12 NOTE — Progress Notes (Signed)
 Occupational Therapy Weekly Progress Note  Patient Details  Name: Joseph [name] MRN: [id number] Date of Birth: 17-Jan-1981  Beginning of progress report period: June 03, 2024 End of progress report period: June 12, 2024  Patient has met 3 of 3 short term goals.  Pt made steady progress with BADLs and functional transfers since admission. Pt completes bathing tasks at shower level with min A using AE PRN. Pt continues tor require assistance bathing buttocks. Pt requires assistance donning socks but completes all other dressing tasks with supervision. Pt requires mod A for toileting tasks. All functional transfers with supervision. Pt educated on RUE/shoulder PROM/SROM/AAROM for strengthening and joint mobility/health. Family has not been present for therapy sessions to complete family education.  Patient continues to demonstrate the following deficits: muscle weakness and muscle joint tightness, decreased cardiorespiratoy endurance, and decreased standing balance, decreased postural control, and decreased balance strategies and therefore will continue to benefit from skilled OT intervention to enhance overall performance with BADL and Reduce care partner burden.  Patient progressing toward long term goals..  Continue plan of care.  OT Short Term Goals Week 1:  OT Short Term Goal 1 (Week 1): Pt will complete sit  > stand with CGA OT Short Term Goal 1 - Progress (Week 1): Met OT Short Term Goal 2 (Week 1): Pt will complete toileting tasks with mod A OT Short Term Goal 2 - Progress (Week 1): Met OT Short Term Goal 3 (Week 1): Pt will complete functional mobility into the bathroom with CGA using the RW OT Short Term Goal 3 - Progress (Week 1): Met OT Short Term Goal 4 (Week 1): Pt will complete LB dressing with LRAD with min A Week 2:  OT Short Term Goal 1 (Week 2): Pt will complete bathing tasks with supervision OT Short Term Goal 2 (Week 2): Pt will complete toileting tasks with min  A OT Short Term Goal 3 (Week 2): Pt will complete LB dressing tasks with supervision using AE PRN  Paras Kreider, OTD, OTR/L [name] 06/12/2024, 2:40 PM

## 2024-06-12 NOTE — Progress Notes (Signed)
 "                                                        PROGRESS NOTE   Subjective/Complaints:  Pt doing well, slept well, pain well managed and overall improving, LBM yesterday, urinating fine. Peripheral edema is a little worse recently. No other complaints or concerns.   ROS: Per HPI   Pt denies SOB, abd pain, CP, N/V/C/D, and vision changes    Objective:   No results found.  No results for input(s): WBC, HGB, HCT, PLT in the last 72 hours.   No results for input(s): NA, K, CL, CO2, GLUCOSE, BUN, CREATININE, CALCIUM in the last 72 hours.   Intake/Output Summary (Last 24 hours) at 06/12/2024 1130 Last data filed at 06/12/2024 0430 Gross per 24 hour  Intake 476 ml  Output 1875 ml  Net -1399 ml        Physical Exam: Vital Signs Blood pressure 119/80, pulse 91, temperature 98.5 F (36.9 C), temperature source Oral, resp. rate 16, height 5' 9 (1.753 m), weight (!) 148.7 kg, SpO2 96%.   General: awake, alert, appropriate, sitting up in w/c working in therapy;  NAD HENT: conjugate gaze; oropharynx moist CV: regular rate; no JVD Pulmonary: CTA B/L; no W/R/R- good air movement GI: soft, NT, ND, (+)BS though a little hypoactive Psychiatric: appropriate- interactive Neurological: Ox3 Extremities: RUE not in sling this AM LE swelling to ankles 2+ B/L   PRIOR EXAMS: Musculoskeletal:     Cervical back: Neck supple.     Comments: LUE 5/5 RUE- at least 3/5 proximally; and 5-/5 distally- limited by pain and surgery of proximal humerus RLE- HF 3-/5; KE/KF 4+/5; DF 4/5 and PF 3-/5 LLE- HF 3+/5; KE/KF 5-/5 and DF/PF 5-/5  Skin:    General: Skin is warm and dry.     Comments: Bandage seen on R proximal arm- c/d/I Didn't assess thoracic incision- due to pt's sleepiness-didn't turn over  Neurological:     Comments: 5-6 beats clonus B/L No hoffman's MAS of 1 in LE's- much better Intact to light touch in all 4 extremities   Assessment/Plan: 1.  Functional deficits which require 3+ hours per day of interdisciplinary therapy in a comprehensive inpatient rehab setting. Physiatrist is providing close team supervision and 24 hour management of active medical problems listed below. Physiatrist and rehab team continue to assess barriers to discharge/monitor patient progress toward functional and medical goals  Care Tool:  Bathing    Body parts bathed by patient: Right arm, Chest, Abdomen, Front perineal area, Right upper leg, Left upper leg, Right lower leg, Left lower leg, Face, Left arm   Body parts bathed by helper: Buttocks     Bathing assist Assist Level: Minimal Assistance - Patient > 75%     Upper Body Dressing/Undressing Upper body dressing   What is the patient wearing?: Pull over shirt    Upper body assist Assist Level: Supervision/Verbal cueing    Lower Body Dressing/Undressing Lower body dressing      What is the patient wearing?: Pants     Lower body assist Assist for lower body dressing: Supervision/Verbal cueing     Toileting Toileting    Toileting assist Assist for toileting: Moderate Assistance - Patient 50 - 74%     Transfers Chair/bed transfer  Transfers assist  Chair/bed transfer assist level: Contact Guard/Touching assist     Locomotion Ambulation   Ambulation assist      Assist level: Contact Guard/Touching assist Assistive device: Walker-rolling Max distance: 1ft   Walk 10 feet activity   Assist     Assist level: Contact Guard/Touching assist Assistive device: Walker-rolling   Walk 50 feet activity   Assist Walk 50 feet with 2 turns activity did not occur: Safety/medical concerns (weakness/fatigue/pain)  Assist level: Contact Guard/Touching assist Assistive device: Walker-rolling    Walk 150 feet activity   Assist Walk 150 feet activity did not occur: Safety/medical concerns (weakness/fatigue/pain)         Walk 10 feet on uneven surface   activity   Assist Walk 10 feet on uneven surfaces activity did not occur: Safety/medical concerns (weakness/fatigue/pain)         Wheelchair     Assist Is the patient using a wheelchair?: Yes Type of Wheelchair: Manual    Wheelchair assist level: Maximal Assistance - Patient 25 - 49% (B LE and L UE) Max wheelchair distance: 10 ft    Wheelchair 50 feet with 2 turns activity    Assist        Assist Level: Total Assistance - Patient < 25%   Wheelchair 150 feet activity     Assist      Assist Level: Total Assistance - Patient < 25%   Blood pressure 119/80, pulse 91, temperature 98.5 F (36.9 C), temperature source Oral, resp. rate 16, height 5' 9 (1.753 m), weight (!) 148.7 kg, SpO2 96%.   Medical Problem List and Plan: 1. 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              -patient may  shower             -ELOS/Goals: ~ 14 days- supervision             Admit to CIR Appropriate for CIR- didn't finish first time due to R humeral pathological fx due to Thyroid  cancer mets  Tumor board hopefully meeting soon- haven't heard from Oncology  D/c 12/30 needs ACE wraps- written for- educated pt and nursing that needs them for swelling  Con't CIR PT and OT 2.  Antithrombotics: -DVT/anticoagulation:  Mechanical: Sequential compression devices, below knee Bilateral lower extremities Pharmaceutical: Eliquis  5mg  BID due to PE 11/10- RUL segmental PE             -antiplatelet therapy: N/A   3. Pain Management: Robaxin  750 TID AC/HS, Lyrica  50 mg TID, Methadone  10 mg TID. Flexeril  10mg  TID, lidoderm  patches. Oxycodone  30 mg q3 hours as needed for breakthrough pain. Palliative care to follow.  12/16- Called Palliative care- will see if can increase Methadone  with goal to reduce Oxycodone  - see if can move Oxy to q4 hours? con't regimen for now, but called and placed consult 12/18- Methadone   up to 12.5 mg q8 hours and Oxycodone  30 mg q3 hours- 5 dose sin last 24 hours- they also started scheduled Tylenol  and stopped Robaxin - weaning flexeril  12/19- increased methadone  today- on 15 methadone  q8 hours- and Oxy 30 mg q4 hours- to change dosing Monday  4. Mood/Behavior/Sleep: LCSW to follow for evaluation and support when available.              -antipsychotic agents: n/a   -Trazodone  PRN  5. Neuropsych/cognition: This patient is capable of making decisions on his own behalf.   6.  Skin/Wound Care: Routine pressure relief measures. Monitor incisions   7. Fluids/Electrolytes/Nutrition: Monitor I&O and weight. Follow up labs CBC/CMP, continue vitamins/supplements.    8. 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 Completed course of dexamethasone . Patient to follow up outpatient with ENT, endocrinology and radiation oncology. ENT plan to discuss at multidisciplinary tumor board on 06/08/24.             - Will likely need Thyroidectomy and RAI-waiting for tumor board 12/16-12/17 should occur at tumor board 12/17 - waiting to hear from Oncology  12/18- waiting for note form Oncology -will reach out about it -06/12/24 see ENT note from 12/20, plan for likely thyroidectomy, outpatient f/up with PET/etc.   9. Pathological right proximal humerus fracture: s/p IMR 11/29. WBAT in RUE due to needing it- use platform walker.  Ortho follow up in 2 weeks for recheck and updated xrays.  -Patient to remain in sling. 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 Patient underwent instrumentation and fusion on prior admission. Radiation Onc plans 2 weeks of radiotherapy to thoracic spine starts 06/02/24- right humerus on 06/09/24.   12/19- to finish Radiation completely by 12/24  11. Hx of RUL segmental  PE: dx on 11/6 provoked secondary to metastatic cancer. Continue Eliquis  for 3-6 months.    12. Incomplete Paraplegia/Neurogenic bladder: Monitor bladder scan with PVR  12/12- will order in/out caths and bladder scans q6 hours- to see if needs cathing -12/13-14/25 no cathing overnight, urinating well   12/16- PVR's 4 to 126 cc in last 24 hours 12/17- Will start Flomax - we both agree he's not emptying all the time-  0.4 mg q supper- hopefully will help frequency  12/18- no PVR's- will reorder since one in last 24 hours  12/19- PVRs 98-110- doing a little better- con't flomax -- stable 12/20-21  13. Chronic normocytic anemia: Stable, continue Niferex 150 mg daily    14. Constipation/mild neurogenic bowel: Continue Miralax   daily and Senokot S 2 tabs BID -06/12/24 LBM last night, states daily for the last couple days; cont regimen   15. Polysubstance use disorder- will have palliative care f/u with meds after d/c for wean   12/16- called palliative care to help with this  16. LE edema B/L 12/17- will order ACE wraps and d/w pt elevating his legs to level of heart when not in therapy  12/18- d/w nursing-  -06/12/24 pt stating a little more swelling, advised use of ACE wraps, but will give one dose of Lasix  20mg  today-- advised that his albumin  is also low so lasix  alone may not help a ton-- encouraged protein intake.      LOS: 10 days A FACE TO FACE EVALUATION WAS PERFORMED  [address] 06/12/2024, 11:30 AM     "

## 2024-06-13 ENCOUNTER — Ambulatory Visit: Payer: MEDICAID

## 2024-06-13 ENCOUNTER — Other Ambulatory Visit: Payer: Self-pay

## 2024-06-13 DIAGNOSIS — K5900 Constipation, unspecified: Secondary | ICD-10-CM

## 2024-06-13 DIAGNOSIS — N319 Neuromuscular dysfunction of bladder, unspecified: Secondary | ICD-10-CM

## 2024-06-13 DIAGNOSIS — Z510 Encounter for antineoplastic radiation therapy: Secondary | ICD-10-CM | POA: Diagnosis not present

## 2024-06-13 DIAGNOSIS — D649 Anemia, unspecified: Secondary | ICD-10-CM

## 2024-06-13 LAB — RAD ONC ARIA SESSION SUMMARY
Course Elapsed Days: 11
Plan Fractions Treated to Date: 3
Plan Fractions Treated to Date: 8
Plan Prescribed Dose Per Fraction: 3 Gy
Plan Prescribed Dose Per Fraction: 4 Gy
Plan Total Fractions Prescribed: 10
Plan Total Fractions Prescribed: 5
Plan Total Prescribed Dose: 20 Gy
Plan Total Prescribed Dose: 30 Gy
Reference Point Dosage Given to Date: 12 Gy
Reference Point Dosage Given to Date: 24 Gy
Reference Point Session Dosage Given: 3 Gy
Reference Point Session Dosage Given: 4 Gy
Session Number: 8

## 2024-06-13 LAB — BASIC METABOLIC PANEL WITH GFR
Anion gap: 6 (ref 5–15)
BUN: 11 mg/dL (ref 6–20)
CO2: 31 mmol/L (ref 22–32)
Calcium: 8.9 mg/dL (ref 8.9–10.3)
Chloride: 100 mmol/L (ref 98–111)
Creatinine, Ser: 0.68 mg/dL (ref 0.61–1.24)
GFR, Estimated: >60 mL/min
Glucose, Bld: 99 mg/dL (ref 70–99)
Potassium: 4.2 mmol/L (ref 3.5–5.1)
Sodium: 137 mmol/L (ref 135–145)

## 2024-06-13 LAB — CBC
HCT: 32.2 % — ABNORMAL LOW (ref 39.0–52.0)
Hemoglobin: 10.3 g/dL — ABNORMAL LOW (ref 13.0–17.0)
MCH: 29.7 pg (ref 26.0–34.0)
MCHC: 32 g/dL (ref 30.0–36.0)
MCV: 92.8 fL (ref 80.0–100.0)
Platelets: UNDETERMINED K/uL (ref 150–400)
RBC: 3.47 MIL/uL — ABNORMAL LOW (ref 4.22–5.81)
RDW: 14.8 % (ref 11.5–15.5)
WBC: 5.1 K/uL (ref 4.0–10.5)
nRBC: 0 % (ref 0.0–0.2)

## 2024-06-13 MED ORDER — FUROSEMIDE 20 MG PO TABS
20.0000 mg | ORAL_TABLET | Freq: Once | ORAL | Status: AC
  Administered 2024-06-13: 20 mg via ORAL
  Filled 2024-06-13: qty 1

## 2024-06-13 MED ORDER — METHADONE HCL 5 MG PO TABS
17.5000 mg | ORAL_TABLET | Freq: Three times a day (TID) | ORAL | Status: DC
  Administered 2024-06-13 – 2024-06-16 (×9): 17.5 mg via ORAL
  Filled 2024-06-13 (×9): qty 2

## 2024-06-13 NOTE — Progress Notes (Signed)
 Physical Therapy Session Note  Patient Details  Name: Joseph [name] MRN: [id number] Date of Birth: Jun 17, 1981  Today's Date: 06/13/2024 PT Individual Time: 1005-1100 PT Individual Time Calculation (min): 55 min   Short Term Goals: Week 2:  PT Short Term Goal 1 (Week 2): Pt will ambulate 50 ft without need for w/c follow PT Short Term Goal 2 (Week 2): Pt will tolerate x 10 min activity without rest break PT Short Term Goal 3 (Week 2): Pt will perform STS with supervision without UE support consistently  Skilled Therapeutic Interventions/Progress Updates:      Pt seated in WC upon arrival. Pt agreeable to therapy. Pt denies any pain.  Pt transported dependent in [facility] for time and energy conservation.   Treatment session focused on endurance/activity tolerance and dynamic standing balance: verbal cues provided throughout for pursed lip breathing with emphasis on technique and sequencing.   Pt stood with no AD and threw corn hole bags x 7 with CGA/min A (L HHA)  for semi tandem stance.   Pt then ambulated distance between [facility] and cornohole board ~10 feet each way to collect bean bags one at a time with use of RW and reacher with CGA progressing to close supervision.   Pt able to stand long enough to throw 7 bean bags and collect 4 bean bags prior to needing seated rest break for fatigue management, Pt then collected remaining 3 bean bags and required seated rest break.   Pt performed sit to stand 2x10 with no AD and arms across the chest and min A progressing to CGA with repetition. Verbal cues provided for anterior weight shift. Pt required prolonged seated rest break between sets 2/2 fatigue.    Pt requesting to return to bed between sessions 2/2 fatigue. Pt performed stand pivot transfer WC to bed with RW and supervision. Pt supine in bed with all needs within reach and bed alarm on.        Therapy Documentation Precautions:  Precautions Precautions: Fall, Back,  Shoulder Type of Shoulder Precautions: WBAT R UE Shoulder Interventions: Shoulder sling/immobilizer Precaution Booklet Issued: Yes (comment) Recall of Precautions/Restrictions: Intact Required Braces or Orthoses: Sling Restrictions Weight Bearing Restrictions Per Provider Order: No RUE Weight Bearing Per Provider Order: Weight bearing as tolerated RLE Weight Bearing Per Provider Order: Weight bearing as tolerated LLE Weight Bearing Per Provider Order: Weight bearing as tolerated Other Position/Activity Restrictions: Per Dr. Dozier We will allow weightbearing to tolerance given his lower extremity paraplegia and need for his upper extremities   Therapy/Group: Individual Therapy  [facility] Doreene Orris, [place], DPT  06/13/2024, 7:46 AM

## 2024-06-13 NOTE — Plan of Care (Signed)
" °  Problem: Consults Goal: RH SPINAL CORD INJURY PATIENT EDUCATION Description:  See Patient Education module for education specifics.  Outcome: Progressing   Problem: RH SKIN INTEGRITY Goal: RH STG SKIN FREE OF INFECTION/BREAKDOWN Description: Manage skin free from infection with supervision assistance Outcome: Progressing   "

## 2024-06-13 NOTE — Progress Notes (Signed)
 "                                                                                                                                                        Daily Progress Note   Patient Name: Joseph [name]       Date: 06/13/2024 DOB: 1981-06-12  Age: 43 y.o. MRN#: [id number] Attending Physician: Joseph Bouchard, MD Primary Care Physician: Patient, No Pcp Per Admit Date: 06/02/2024  Reason for Consultation/Follow-up: Pain control  Subjective: Medical records reviewed including progress notes, labs, imaging, MAR. Patient assessed at the bedside.  He reports average pain level of about 7 out of 10.  This goes down to about 4 out of 10 at the best, usually when he is taking his as needed oxycodone  near the time of his scheduled methadone  dose.  He has not noticed much difference since latest methadone  dosage increase on Friday.  Per MAR, he has required 4 doses of as needed oxycodone  in the past 24 hours.  Created space and opportunity for patient's thoughts and feelings on patient's current illness.  We discussed plan to increase dosage of methadone  today and likelihood that he will need another increase in dose later this week given his total use of oxycodone  (187.5 MME's).  He understands the goal is to eventually have the equivalent dosage in methadone  with less required oxycodone  for breakthrough pain.  Questions and concerns addressed. PMT will continue to support holistically.   Length of Stay: 11   Physical Exam Vitals and nursing note reviewed.  Constitutional:      General: He is not in acute distress. HENT:     Head: Normocephalic and atraumatic.  Cardiovascular:     Rate and Rhythm: Normal rate.  Pulmonary:     Effort: Pulmonary effort is normal. No respiratory distress.  Skin:    General: Skin is warm and dry.  Neurological:     Mental Status: He is alert and oriented to person, place, and time.  Psychiatric:        Mood and Affect: Mood normal.        Behavior: Behavior  normal.            Vital Signs: BP 122/73 (BP Location: Right Arm)   Pulse 93   Temp 98.6 F (37 C) (Oral)   Resp 17   Ht 5' 9 (1.753 m)   Wt (!) 148.7 kg   SpO2 97%   BMI 48.41 kg/m  SpO2: SpO2: 97 % O2 Device: O2 Device: Room Air O2 Flow Rate:    Palliative Care Assessment & Plan   Patient Profile: 43 y.o. male  with past medical history of asthma, anxiety, depression, metastatic papillary thyroid  cancer and history of polysubstance use disorder admitted to CIR on 06/02/2024 with functional deficits secondary to incomplete paraplegia- ASIA  D due to nontraumatic Thyroid  cancer mets/pathological fx.    Patient was admitted 11/5-11/24 for post-op management of posterior T1-3 instrumentation fusion T2 corpectomy for resection of tumor, acute PE, thyroid  biopsy confirming papillary carcinoma, acute blood loss anemia due to procedure. He was then in CIR from 11/24-11/28, though participation in therapy was limited due to an acute fracture/metastasis. Patient was seen by PMT for pain control during readmission 11/29-12/11 for pathological fracture of right humerus. He has been in CIR since then with upcoming tumor board to discuss thyroidectomy.    PMT has been consulted to assist with acute cancer-related pain management.  Assessment: Acute on chronic right shoulder pain due to pathological fracture, improving Metastatic thyroid  cancer  Recommendations/Plan: Increase methadone  to 17.5 mg p.o. every 8 hours Continue oxycodone  30 mg p.o. to every 4 hours as needed for breakthrough pain Continue Tylenol  1000 mg p.o. 3 times daily Psychosocial and emotional support provided PMT will continue to follow and support; anticipate next increase in methadone  dose on 12/25   Prognosis:  Unable to determine  Discharge Planning: Home with Palliative Services          Joseph Aloisi [name], PA-C  Palliative Medicine Team Team phone # [phone]  Thank you for allowing the Palliative  Medicine Team to assist in the care of this patient. Please utilize secure chat with additional questions, if there is no response within 30 minutes please call the above phone number.  Palliative Medicine Team providers are available by phone from 7am to 7pm daily and can be reached through the team cell phone.  Should this patient require assistance outside of these hours, please call the patient's attending physician.    Billing based on MDM: Moderate   "

## 2024-06-13 NOTE — Progress Notes (Signed)
 Occupational Therapy Session Note  Patient Details  Name: Joseph Joseph MRN: [id number] Date of Birth: 08/14/1980  Today's Date: 06/13/2024 OT Individual Time: 9199-9084 & 1300-1415 OT Individual Time Calculation (min): 75 min & 75 min   Short Term Goals: Week 2:  OT Short Term Goal 1 (Week 2): Pt will complete bathing tasks with supervision OT Short Term Goal 2 (Week 2): Pt will complete toileting tasks with min A OT Short Term Goal 3 (Week 2): Pt will complete LB dressing tasks with supervision using AE PRN  Skilled Therapeutic Interventions/Progress Updates:      Therapy Documentation Precautions:  Precautions Precautions: Fall, Back, Shoulder Type of Shoulder Precautions: WBAT R UE Shoulder Interventions: Shoulder sling/immobilizer Precaution Booklet Issued: Yes (comment) Recall of Precautions/Restrictions: Intact Required Braces or Orthoses: Sling Restrictions Weight Bearing Restrictions Per Provider Order: No RUE Weight Bearing Per Provider Order: Weight bearing as tolerated RLE Weight Bearing Per Provider Order: Weight bearing as tolerated LLE Weight Bearing Per Provider Order: Weight bearing as tolerated Other Position/Activity Restrictions: Per Dr. Dozier We will allow weightbearing to tolerance given his lower extremity paraplegia and need for his upper extremities Session 1 General: Pt just got done using bathroom with NT, ambulating with RW back into room upon OT arrival, agreeable to OT session.  Pain:  5/10 pain reported in Rt shoulder, activity, intermittent rest breaks, distractions provided for pain management, pt reports tolerable to proceed.   ADL: OT providing skilled intervention on ADL retraining in order to increase independence with tasks and increase activity tolerance. Pt completed the following tasks at the current level of assist: Grooming/oral hygiene: set up seated at sink for oral hygiene UB dressing: set up for donning overhead shirt  seated on hair LB dressing: SBA with use of reacher  Footwear: total A to wrap legs with ACE for edema management Shower transfer: SBA with RW ambulating from room, able to make 180* turn with no LOB Bathing: SBA with use of long handle sponge on TTB with removable shower handle  Exercises: Pt completed 13 minutes of arm bike, 6.5 min forward, 6.5 min backward in order to increase BUE/BLEstrength and endurance in preparation for increased independence in ADLs such as UB dressing and increased RUE ROM. Rest break after 5 min, on level 1 resistance.  Other Treatments: OT discussing DME needs for D/C planning. Pt does not need BSC and pt reporting will be checking with aunt to see if has shower chair.    Pt seated in W/C at end of session with call light within reach and 4Ps assessed.    Session 2 General: Pt supine in bed upon OT arrival, agreeable to OT session.  Pain:  3/10 pain reported in Rt shoulder activity, intermittent rest breaks, distractions provided for pain management, pt reports tolerable to proceed.   ADL: OT providing skilled intervention on ADL retraining in order to increase independence with tasks and increase activity tolerance. Pt completed the following tasks at the current level of assist: Bed mobility: SBA EOB><supine, pt able to consistently manage legs in/out of bed during sessions Transfers: SBA all transfers during session with RW no LOB/SOB  Exercises: Pt completed the following exercise circuit in order to improve functional activity, strength, ROM and endurance of Rt shoulder to prepare for ADLs such as bathing. Pt completed the following exercises in seated/standing position with no noted LOB/SOB and various repetitions on each exercise: -wall stretches with OT assisting with supporting arm -pendulum swings in standing (anterior->posterior, side to  side and clockwise/counterclockwise) -PNF movements with rings using RUE and OT providing occasional  stabilization for Rt arm when fatiguing   Pt supine in bed with 3 bed rails up, call light within reach and 4Ps assessed.   Therapy/Group: Individual Therapy  Camie Hoe, OTD, OTR/L 06/13/2024, 4:04 PM

## 2024-06-13 NOTE — Progress Notes (Signed)
 "                                                        PROGRESS NOTE   Subjective/Complaints:  Continues LE edema. Pain overall controlled, he continues to have some in his R shoulder.  +BM this morning  ROS: Per HPI   Pt denies chills, SOB, abd pain, CP, N/V/C/D, and vision changes    Objective:   No results found.  Recent Labs    06/13/24 0438  WBC 5.1  HGB 10.3*  HCT 32.2*  PLT PLATELET CLUMPS NOTED ON SMEAR, UNABLE TO ESTIMATE     Recent Labs    06/13/24 0438  NA 137  K 4.2  CL 100  CO2 31  GLUCOSE 99  BUN 11  CREATININE 0.68  CALCIUM 8.9     Intake/Output Summary (Last 24 hours) at 06/13/2024 1236 Last data filed at 06/13/2024 9166 Gross per 24 hour  Intake 636 ml  Output 1500 ml  Net -864 ml        Physical Exam: Vital Signs Blood pressure 122/73, pulse 93, temperature 98.6 F (37 C), temperature source Oral, resp. rate 17, height 5' 9 (1.753 m), weight (!) 148.7 kg, SpO2 97%.   General: awake, alert, appropriate, sitting up in w/c, NAD HENT: conjugate gaze; oropharynx moist CV: regular rate; no JVD Pulmonary: CTA B/L; no W/R/R- good air movement GI: soft, NT, ND, (+)BS Psychiatric: appropriate- interactive Neurological: Ox3 Extremities: RUE not in sling this AM LE swelling to ankles 2+ B/L -continued, ace wrap in place  PRIOR EXAMS: Musculoskeletal:     Cervical back: Neck supple.     Comments: LUE 5/5 RUE- at least 3/5 proximally; and 5-/5 distally- limited by pain and surgery of proximal humerus RLE- HF 3-/5; KE/KF 4+/5; DF 4/5 and PF 3-/5 LLE- HF 3+/5; KE/KF 5-/5 and DF/PF 5-/5  Skin:    General: Skin is warm and dry.     Comments: Bandage seen on R proximal arm- c/d/I Didn't assess thoracic incision- due to pt's sleepiness-didn't turn over  Neurological:     Comments: 5-6 beats clonus B/L No hoffman's MAS of 1 in LE's- much better Intact to light touch in all 4 extremities   Assessment/Plan: 1. Functional deficits  which require 3+ hours per day of interdisciplinary therapy in a comprehensive inpatient rehab setting. Physiatrist is providing close team supervision and 24 hour management of active medical problems listed below. Physiatrist and rehab team continue to assess barriers to discharge/monitor patient progress toward functional and medical goals  Care Tool:  Bathing    Body parts bathed by patient: Right arm, Chest, Abdomen, Front perineal area, Right upper leg, Left upper leg, Right lower leg, Left lower leg, Face, Left arm   Body parts bathed by helper: Buttocks     Bathing assist Assist Level: Minimal Assistance - Patient > 75%     Upper Body Dressing/Undressing Upper body dressing   What is the patient wearing?: Pull over shirt    Upper body assist Assist Level: Supervision/Verbal cueing    Lower Body Dressing/Undressing Lower body dressing      What is the patient wearing?: Pants     Lower body assist Assist for lower body dressing: Supervision/Verbal cueing     Toileting Toileting    Toileting assist Assist  for toileting: Moderate Assistance - Patient 50 - 74%     Transfers Chair/bed transfer  Transfers assist     Chair/bed transfer assist level: Contact Guard/Touching assist     Locomotion Ambulation   Ambulation assist      Assist level: Contact Guard/Touching assist Assistive device: Walker-rolling Max distance: 57ft   Walk 10 feet activity   Assist     Assist level: Contact Guard/Touching assist Assistive device: Walker-rolling   Walk 50 feet activity   Assist Walk 50 feet with 2 turns activity did not occur: Safety/medical concerns (weakness/fatigue/pain)  Assist level: Contact Guard/Touching assist Assistive device: Walker-rolling    Walk 150 feet activity   Assist Walk 150 feet activity did not occur: Safety/medical concerns (weakness/fatigue/pain)         Walk 10 feet on uneven surface  activity   Assist Walk 10 feet  on uneven surfaces activity did not occur: Safety/medical concerns (weakness/fatigue/pain)         Wheelchair     Assist Is the patient using a wheelchair?: Yes Type of Wheelchair: Manual    Wheelchair assist level: Maximal Assistance - Patient 25 - 49% (B LE and L UE) Max wheelchair distance: 10 ft    Wheelchair 50 feet with 2 turns activity    Assist        Assist Level: Total Assistance - Patient < 25%   Wheelchair 150 feet activity     Assist      Assist Level: Total Assistance - Patient < 25%   Blood pressure 122/73, pulse 93, temperature 98.6 F (37 C), temperature source Oral, resp. rate 17, height 5' 9 (1.753 m), weight (!) 148.7 kg, SpO2 97%.   Medical Problem List and Plan: 1. 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              -patient may  shower             -ELOS/Goals: ~ 14 days- supervision             Admit to CIR Appropriate for CIR- didn't finish first time due to R humeral pathological fx due to Thyroid  cancer mets  Tumor board hopefully meeting soon- haven't heard from Oncology  D/c 12/30 Continue ACE wraps- written for- educated pt and nursing that needs them for swelling  Con't CIR PT and OT 2.  Antithrombotics: -DVT/anticoagulation:  Mechanical: Sequential compression devices, below knee Bilateral lower extremities Pharmaceutical: Eliquis  5mg  BID due to PE 11/10- RUL segmental PE             -antiplatelet therapy: N/A   3. Pain Management: Robaxin  750 TID AC/HS, Lyrica  50 mg TID, Methadone  10 mg TID. Flexeril  10mg  TID, lidoderm  patches. Oxycodone  30 mg q3 hours as needed for breakthrough pain. Palliative care to follow.  12/16- Called Palliative care- will see if can increase Methadone  with goal to reduce Oxycodone  - see if can move Oxy to q4 hours? con't regimen for now, but called and placed consult 12/18- Methadone  up to 12.5 mg q8 hours and  Oxycodone  30 mg q3 hours- 5 dose sin last 24 hours- they also started scheduled Tylenol  and stopped Robaxin - weaning flexeril  12/19- increased methadone  today- on 15 methadone  q8 hours- and Oxy 30 mg q4 hours- to change dosing Monday 12/22 pain controlled, continue current regimen and monitor   4. Mood/Behavior/Sleep: LCSW to follow for evaluation and support when available.              -  antipsychotic agents: n/a   -Trazodone  PRN  5. Neuropsych/cognition: This patient is capable of making decisions on his own behalf.   6. Skin/Wound Care: Routine pressure relief measures. Monitor incisions   7. Fluids/Electrolytes/Nutrition: Monitor I&O and weight. Follow up labs CBC/CMP, continue vitamins/supplements.    8. 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 Completed course of dexamethasone . Patient to follow up outpatient with ENT, endocrinology and radiation oncology. ENT plan to discuss at multidisciplinary tumor board on 06/08/24.             - Will likely need Thyroidectomy and RAI-waiting for tumor board 12/16-12/17 should occur at tumor board 12/17 - waiting to hear from Oncology  12/18- waiting for note form Oncology -will reach out about it -06/12/24 see ENT note from 12/20, plan for likely thyroidectomy, outpatient f/up with PET/etc.  F/U Dr. Reyes Alexander after DC  9. Pathological right proximal humerus fracture: s/p IMR 11/29. WBAT in RUE due to needing it- use platform walker.  Ortho follow up in 2 weeks for recheck and updated xrays.  -Patient to remain in sling. 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 Patient underwent instrumentation and fusion on prior admission. Radiation Onc plans 2 weeks of radiotherapy to thoracic spine starts 06/02/24- right humerus on 06/09/24.   12/19- to finish Radiation completely by 12/24  11. Hx of RUL segmental  PE: dx on 11/6 provoked  secondary to metastatic cancer. Continue Eliquis  for 3-6 months.   12. Incomplete Paraplegia/Neurogenic bladder: Monitor bladder scan with PVR  12/12- will order in/out caths and bladder scans q6 hours- to see if needs cathing -12/13-14/25 no cathing overnight, urinating well   12/16- PVR's 4 to 126 cc in last 24 hours 12/17- Will start Flomax - we both agree he's not emptying all the time-  0.4 mg q supper- hopefully will help frequency  12/18- no PVR's- will reorder since one in last 24 hours  12/19- PVRs 98-110- doing a little better- con't flomax -- stable 12/20-22  13. Chronic normocytic anemia: Stable, continue Niferex 150 mg daily   -12/22 HGB stable 10.3   14. Constipation/mild neurogenic bowel: Continue Miralax   daily and Senokot S 2 tabs BID -06/12/24 LBM last night, states daily for the last couple days; cont regimen  -12/22 LBM today, continue to monitor  15. Polysubstance use disorder- will have palliative care f/u with meds after d/c for wean   12/16- called palliative care to help with this  16. LE edema B/L 12/17- will order ACE wraps and d/w pt elevating his legs to level of heart when not in therapy  12/18- d/w nursing-  -06/12/24 pt stating a little more swelling, advised use of ACE wraps, but will give one dose of Lasix  20mg  today-- advised that his albumin  is also low so lasix  alone may not help a ton-- encouraged protein intake.   -12/22 Discussed leg elevation, give additional lasix  20mg  today    LOS: 11 days A FACE TO FACE EVALUATION WAS PERFORMED  Murray Collier 06/13/2024, 12:36 PM     "

## 2024-06-14 ENCOUNTER — Ambulatory Visit: Payer: MEDICAID

## 2024-06-14 ENCOUNTER — Other Ambulatory Visit: Payer: Self-pay

## 2024-06-14 DIAGNOSIS — Z510 Encounter for antineoplastic radiation therapy: Secondary | ICD-10-CM | POA: Diagnosis not present

## 2024-06-14 LAB — RAD ONC ARIA SESSION SUMMARY
Course Elapsed Days: 12
Plan Fractions Treated to Date: 4
Plan Fractions Treated to Date: 9
Plan Prescribed Dose Per Fraction: 3 Gy
Plan Prescribed Dose Per Fraction: 4 Gy
Plan Total Fractions Prescribed: 10
Plan Total Fractions Prescribed: 5
Plan Total Prescribed Dose: 20 Gy
Plan Total Prescribed Dose: 30 Gy
Reference Point Dosage Given to Date: 16 Gy
Reference Point Dosage Given to Date: 27 Gy
Reference Point Session Dosage Given: 3 Gy
Reference Point Session Dosage Given: 4 Gy
Session Number: 9

## 2024-06-14 MED ORDER — FUROSEMIDE 20 MG PO TABS
20.0000 mg | ORAL_TABLET | Freq: Once | ORAL | Status: AC
  Administered 2024-06-14: 20 mg via ORAL
  Filled 2024-06-14: qty 1

## 2024-06-14 NOTE — Patient Care Conference (Signed)
 Inpatient RehabilitationTeam Conference and Plan of Care Update Date: 06/14/2024   Time: 1117 am    Patient Name: Joseph [name]      Medical Record Number: [id number]  Date of Birth: [birth year] Sex: Male         Room/Bed: 4W08C/4W08C-01 Payor Info: Payor: [employer] / Plan: [employer] / Product Type: *No Product type* /    Admit Date/Time:  06/02/2024  5:51 PM  Primary Diagnosis:  Acute incomplete paraplegia [facility])  Hospital Problems: Principal Problem:   Acute incomplete paraplegia (HCC) Active Problems:   Spinal cord injury at T1-T6 level [facility])   Stress and adjustment reaction   Depression with anxiety    Expected Discharge Date: Expected Discharge Date: 06/21/24  Team Members Present: Physician leading conference: Dr. Murray Collier Social Worker Present: Waverly Gentry, LCSW-A Nurse Present: Eulalio Falls, RN PT Present: Doreene Orris, PT OT Present: Camie Hoe, OT     Current Status/Progress Goal Weekly Team Focus  Bowel/Bladder   continent of b/b; LBM: 12/22   maintain regular bowel pattern   assist with toileting needs prn    Swallow/Nutrition/ Hydration               ADL's   SBA with RW transfers, CGA LB dressing with reacher, Min A toileting, SBA UB ADLs, inreased functional use of RUE, using to complete ADLs now  Mod I overall-SBA bathing   family ed, shoulder moblity, D/C planning    Mobility   on track for d/c. supervision transfers, gait up to 120 ft with CGA, limited by endurance, stairs with hand rail CGA, progressing to using RW per home environmnent   mod I transfers, supervision gait up to 120 ft, CGA stairs  D/c 12/30, follow up HHPT, pt will need a WC for community, working on: stair training with RW, family ed, endurance    Communication                Safety/Cognition/ Behavioral Observations               Pain   c/o shoulder pain; prn oxycodone    pain level <6/10   assess pain QS and prn     Skin   skin intact   maintain skin integrity  assess skin QS and prn      Discharge Planning:  Pt will d/c to home to his aunt's home and she can only provide supervision as she is currently in chemo treatment. He will have intermittent supervision as there is no one that can be there during the day due to work schedule. Family can bring him a meal if needed, but no more than that during the day. SW explained limitations of options due to insurance. PCS referral submitted. CAP/DA to be submitted by [employer]. Fam edu on 12/261pm-4pm. SW will confirm there are no barriers to discharge.    Team Discussion: Patient was admitted post pathological T2 fx with myelopathy d/t metastatic thyroid  cancer.Patient post T1-3 laminectomy. Patient T2 incomplete paraplegia- ASIA D. Patient wit pain, edema : medication adjusted by MD. Patient progress limited by fatigue, limited right shoulder ROM.  Patient on target to meet rehab goals: yes, currently patient needs min assistance with ADLs. Patient was able to feed self and increase functional use of RUE to complete ADLs. Patient needs supervision assistance with transfers. Patient was able to ambulate up to 120' with CGA using a rolling walker. Overall goals at discharge are set for supervision- mod I  assistance.   *See Care Plan and progress notes for long and short-term goals.   Revisions to Treatment Plan:  Upgraded goals  Palliative consult for pain PCS referral Adaptive equipment Ace wraps  Teaching Needs: Safety, medications, transfers, toileting, Weight bearing precautions, leg elevation, etc.   Current Barriers to Discharge: Decreased caregiver support, Home enviroment access/layout, Weight, and Weight bearing restrictions and Radiation therapy  Possible Resolutions to Barriers: Family Education Home health follow up DME: wheelchair; family will provide bariatric shower chair     Medical Summary Current Status: paraplegia,  pain, neurogenic bowel and bladder, LE edema  Barriers to Discharge: Medical stability;Neurogenic Bowel & Bladder;Self-care education  Barriers to Discharge Comments: paraplegia, pain, neurogenic bowel and bladder, LE edema Possible Resolutions to [employer] Focus: monitor bowel and bladder, lasix , compression for legs   Continued Need for Acute Rehabilitation Level of Care: The patient requires daily medical management by a physician with specialized training in physical medicine and rehabilitation for the following reasons: Direction of a multidisciplinary physical rehabilitation program to maximize functional independence : Yes Medical management of patient stability for increased activity during participation in an intensive rehabilitation regime.: Yes Analysis of laboratory values and/or radiology reports with any subsequent need for medication adjustment and/or medical intervention. : Yes   I attest that I was present, lead the team conference, and concur with the assessment and plan of the team.   Abagale Boulos Gayo 06/14/2024, 1117 am

## 2024-06-14 NOTE — Progress Notes (Addendum)
 Patient ID: Joseph [name], male   DOB: 1980-10-09, 43 y.o.   MRN: [id number]  Have reviewed team conference with pt and family. Both aware and agreeable with targeted d/c date of 12/30 and goals of Independent with assistive device.  Family education to be held on 12/26 between 1pm-4pm with his parents.   22x18 wheelchair has been ordered via [employer].  Unable to order bariatric shower seat as they do not carry that particular item. Family is aware that they will have to purchase on their own. A note will be left for the family per their request.   Transportation home will be provided by either his aunt of mother.

## 2024-06-14 NOTE — Progress Notes (Signed)
 Physical Therapy Session Note  Patient Details  Name: Joseph [name] MRN: [id number] Date of Birth: 1981/05/14  Today's Date: 06/14/2024 PT Individual Time: 1240-1336 PT Individual Time Calculation (min): 56 min   Short Term Goals: Week 2:  PT Short Term Goal 1 (Week 2): Pt will ambulate 50 ft without need for w/c follow PT Short Term Goal 2 (Week 2): Pt will tolerate x 10 min activity without rest break PT Short Term Goal 3 (Week 2): Pt will perform STS with supervision without UE support consistently  Skilled Therapeutic Interventions/Progress Updates:      Pt sitting in w/c to start - agreeable to therapy treatment. He reports he just received pain rx recently, 3/10 pain in R shoulder. Pt requesting to urinate before leaving his room - used urinal for urgency and pt continent of bladder void.   Pt propels himself at w/c level ~50 with supervision using BUE to propel, difficulty turning and gaining speed at w/c level due to weight and shoulder discomfort. Transported remaining distance to main gym.   Pt requesting to work on standing and balance in // bars: -forward gait with palm/finger tip support only 6x68ft with CGA -lateral stepping in // bars with red TB resistance around ankles, BUE support. 12x6ft *Pt reporting urgent need to void due to lasix . Pt ambulated from main gym to public restroom with CGA and RW ~65'. Pt able to stand and urinate with RW support with supervision for balance, handwashing at the sink with supervision. Seated rest break needed due to fatigue after completing these tasks, which was done at 4W elevators.   Returned to main gym // bars to continue strength building and activity tolerance training. -high knee marching in // bars with red TB resistance around feet, BUE support, 2x8  Pt returned to his room via w/c for time management. Ended treatment sitting in w/c with needs met.   Therapy Documentation Precautions:  Precautions Precautions: Fall,  Back, Shoulder Type of Shoulder Precautions: WBAT R UE Shoulder Interventions: Shoulder sling/immobilizer Precaution Booklet Issued: Yes (comment) Recall of Precautions/Restrictions: Intact Required Braces or Orthoses: Sling Restrictions Weight Bearing Restrictions Per Provider Order: No RUE Weight Bearing Per Provider Order: Weight bearing as tolerated RLE Weight Bearing Per Provider Order: Weight bearing as tolerated LLE Weight Bearing Per Provider Order: Weight bearing as tolerated Other Position/Activity Restrictions: Per Dr. Dozier We will allow weightbearing to tolerance given his lower extremity paraplegia and need for his upper extremities General:      Therapy/Group: Individual Therapy  [name] [name] 06/14/2024, 8:01 AM

## 2024-06-14 NOTE — Progress Notes (Signed)
 Occupational Therapy Session Note  Patient Details  Name: Joseph [name] MRN: [id number] Date of Birth: Aug 28, 1980  Today's Date: 06/14/2024 OT Individual Time: 0800-0915 & 1015-11 & 1130-1200 OT Individual Time Calculation (min): 75 min & 45 min & 30 min    Short Term Goals: Week 2:  OT Short Term Goal 1 (Week 2): Pt will complete bathing tasks with supervision OT Short Term Goal 2 (Week 2): Pt will complete toileting tasks with min A OT Short Term Goal 3 (Week 2): Pt will complete LB dressing tasks with supervision using AE PRN  Skilled Therapeutic Interventions/Progress Updates:      Therapy Documentation Precautions:  Precautions Precautions: Fall, Back, Shoulder Type of Shoulder Precautions: WBAT R UE Shoulder Interventions: Shoulder sling/immobilizer Precaution Booklet Issued: Yes (comment) Recall of Precautions/Restrictions: Intact Required Braces or Orthoses: Sling Restrictions Weight Bearing Restrictions Per Provider Order: No RUE Weight Bearing Per Provider Order: Weight bearing as tolerated RLE Weight Bearing Per Provider Order: Weight bearing as tolerated LLE Weight Bearing Per Provider Order: Weight bearing as tolerated Other Position/Activity Restrictions: Per Dr. Dozier We will allow weightbearing to tolerance given his lower extremity paraplegia and need for his upper extremities Session 1 General:  Pt supine in bed upon OT arrival, agreeable to OT session.  Pain:  4/10 pain reported in Rt shoulder, activity, intermittent rest breaks, distractions provided for pain management, pt reports tolerable to proceed.   ADL: OT providing skilled intervention on ADL retraining in order to increase independence with tasks and increase activity tolerance. Pt completed the following tasks at the current level of assist: Bed mobility: SBA with slight raised HOB supine>EOB Grooming/oral hygiene: Set up seated at sink, pt using RUE during ADLs  UB dressing: set up  seated EOB for donning overhead shirt  Footwear: total A for OT wrapping legs in ACE wrap for edema management Transfers: SBA overall with RW for all transfers/mobility  Exercises: Pt completed standing activities in order to promote increased balance strategies with ADL participation as well as incorporating RUE for increased strength and ROM. Pt completed all activities at standing level with intermittent supported and unsupported standing at RW. Pt completed card matching on vertical surface using RUE. Pt still using compensatory strategies although OT providing VC for decreased compensation strategies.   Other Treatments: OT providing skilled intervention with gentle ROM for RUE for decreased muscle stiffness. OT able to passively range Rt shoulder to ~120* with pt in gravity eliminated position.    Pt seated in W/C at end of session with W/C alarm donned, call light within reach and 4Ps assessed.    Session 2 General: Pt seated in W/C upon OT arrival, agreeable to OT.  Pain:  4/10 pain reported in Rt shoulder, activity, intermittent rest breaks, distractions provided for pain management, pt reports tolerable to proceed.   Exercises: Pt completed multiple exercises on the BITS in order to increase static/dynamic balance, Rt shoulder ROM by reaching to vertical surface and postural control in order to increase independence with ADL tasks. Pt completed activities with SBA with RW assistance and no LOB, pt taking intermittent rest breaks for energy conservation.    Pt seated in W/C at end of session with W/C alarm donned, call light within reach and 4Ps assessed.    Session 3 General: Pt seated in W/C upon OT arrival, agreeable to OT.   Pain: 4/10 pain reported in Rt shoulder, activity, intermittent rest breaks, distractions provided for pain management, pt reports tolerable to proceed.  Exercises: Pt completed multiple exercises on the BITS in order to increase static/dynamic  balance, Rt shoulder ROM by reaching to vertical surface and postural control in order to increase independence with ADL tasks. Pt completed activities with SBA with RW assistance and no LOB, pt taking intermittent rest breaks for energy conservation.    Pt seated in W/C at end of session with handoff to nsg for meds.   Therapy/Group: Individual Therapy  Camie Hoe, OTD, OTR/L 06/14/2024, 12:23 PM

## 2024-06-14 NOTE — Progress Notes (Addendum)
 "                                                        PROGRESS NOTE   Subjective/Complaints:  Pt feels LE edema a little better. +BM yesterday. No additional concerns.   ROS: Per HPI   Pt denies fever, SOB, abd pain, CP, N/V/C/D, and vision changes    Objective:   No results found.  Recent Labs    06/13/24 0438  WBC 5.1  HGB 10.3*  HCT 32.2*  PLT PLATELET CLUMPS NOTED ON SMEAR, UNABLE TO ESTIMATE     Recent Labs    06/13/24 0438  NA 137  K 4.2  CL 100  CO2 31  GLUCOSE 99  BUN 11  CREATININE 0.68  CALCIUM 8.9     Intake/Output Summary (Last 24 hours) at 06/14/2024 1022 Last data filed at 06/14/2024 0843 Gross per 24 hour  Intake 692 ml  Output 1950 ml  Net -1258 ml        Physical Exam: Vital Signs Blood pressure 126/82, pulse 89, temperature 98.4 F (36.9 C), temperature source Oral, resp. rate 19, height 5' 9 (1.753 m), weight (!) 148.7 kg, SpO2 99%.   General: awake, alert, appropriate, NAD, sitting in WC HENT: conjugate gaze; oropharynx moist CV: regular rate; no JVD Pulmonary: CTA B/L; no W/R/R- good air movement GI: soft, NT, ND, (+)BS Psychiatric: appropriate- interactive Neurological: Ox3, follows commands Extremities: RUE not in sling this AM LE swelling to ankles 2+ B/L -continued, ace wrap in place  PRIOR EXAMS: Musculoskeletal:     Cervical back: Neck supple.     Comments: LUE 5/5 RUE- at least 3/5 proximally; and 5-/5 distally- limited by pain and surgery of proximal humerus RLE- HF 3-/5; KE/KF 4+/5; DF 4/5 and PF 3-/5 LLE- HF 3+/5; KE/KF 5-/5 and DF/PF 5-/5  Skin:    General: Skin is warm and dry.     Comments: Bandage seen on R proximal arm- c/d/I Didn't assess thoracic incision- due to pt's sleepiness-didn't turn over  Neurological:     Comments: 5-6 beats clonus B/L No hoffman's MAS of 1 in LE's- much better Intact to light touch in all 4 extremities   Assessment/Plan: 1. Functional deficits which require 3+  hours per day of interdisciplinary therapy in a comprehensive inpatient rehab setting. Physiatrist is providing close team supervision and 24 hour management of active medical problems listed below. Physiatrist and rehab team continue to assess barriers to discharge/monitor patient progress toward functional and medical goals  Care Tool:  Bathing    Body parts bathed by patient: Right arm, Chest, Abdomen, Front perineal area, Right upper leg, Left upper leg, Right lower leg, Left lower leg, Face, Left arm   Body parts bathed by helper: Buttocks     Bathing assist Assist Level: Minimal Assistance - Patient > 75%     Upper Body Dressing/Undressing Upper body dressing   What is the patient wearing?: Pull over shirt    Upper body assist Assist Level: Supervision/Verbal cueing    Lower Body Dressing/Undressing Lower body dressing      What is the patient wearing?: Pants     Lower body assist Assist for lower body dressing: Supervision/Verbal cueing     Toileting Toileting    Toileting assist Assist for toileting: Moderate Assistance -  Patient 50 - 74%     Transfers Chair/bed transfer  Transfers assist     Chair/bed transfer assist level: Contact Guard/Touching assist     Locomotion Ambulation   Ambulation assist      Assist level: Contact Guard/Touching assist Assistive device: Walker-rolling Max distance: 90ft   Walk 10 feet activity   Assist     Assist level: Contact Guard/Touching assist Assistive device: Walker-rolling   Walk 50 feet activity   Assist Walk 50 feet with 2 turns activity did not occur: Safety/medical concerns (weakness/fatigue/pain)  Assist level: Contact Guard/Touching assist Assistive device: Walker-rolling    Walk 150 feet activity   Assist Walk 150 feet activity did not occur: Safety/medical concerns (weakness/fatigue/pain)         Walk 10 feet on uneven surface  activity   Assist Walk 10 feet on uneven  surfaces activity did not occur: Safety/medical concerns (weakness/fatigue/pain)         Wheelchair     Assist Is the patient using a wheelchair?: Yes Type of Wheelchair: Manual    Wheelchair assist level: Maximal Assistance - Patient 25 - 49% (B LE and L UE) Max wheelchair distance: 10 ft    Wheelchair 50 feet with 2 turns activity    Assist        Assist Level: Total Assistance - Patient < 25%   Wheelchair 150 feet activity     Assist      Assist Level: Total Assistance - Patient < 25%   Blood pressure 126/82, pulse 89, temperature 98.4 F (36.9 C), temperature source Oral, resp. rate 19, height 5' 9 (1.753 m), weight (!) 148.7 kg, SpO2 99%.   Medical Problem List and Plan: 1. 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              -patient may  shower             -ELOS/Goals: ~ 14 days- supervision             Admit to CIR Appropriate for CIR- didn't finish first time due to R humeral pathological fx due to Thyroid  cancer mets  Tumor board hopefully meeting soon- haven't heard from Oncology  D/c 12/30 Continue ACE wraps- written for- educated pt and nursing that needs them for swelling  Con't CIR PT and OT  Team conference today please see physician documentation under team conference tab, met with team  to discuss problems,progress, and goals. Formulized individual treatment plan based on medical history, underlying problem and comorbidities.   2.  Antithrombotics: -DVT/anticoagulation:  Mechanical: Sequential compression devices, below knee Bilateral lower extremities Pharmaceutical: Eliquis  5mg  BID due to PE 11/10- RUL segmental PE             -antiplatelet therapy: N/A   3. Pain Management: Robaxin  750 TID AC/HS, Lyrica  50 mg TID, Methadone  10 mg TID. Flexeril  10mg  TID, lidoderm  patches. Oxycodone  30 mg q3 hours as needed for breakthrough pain. Palliative care to follow.   12/16- Called Palliative care- will see if can increase Methadone  with goal to reduce Oxycodone  - see if can move Oxy to q4 hours? con't regimen for now, but called and placed consult 12/18- Methadone  up to 12.5 mg q8 hours and Oxycodone  30 mg q3 hours- 5 dose sin last 24 hours- they also started scheduled Tylenol  and stopped Robaxin - weaning flexeril  12/19- increased methadone  today- on 15 methadone  q8 hours- and Oxy 30 mg q4 hours-  to change dosing Monday 12/22-23 pain controlled, continue current regimen and monitor   4. Mood/Behavior/Sleep: LCSW to follow for evaluation and support when available.              -antipsychotic agents: n/a   -Trazodone  PRN  5. Neuropsych/cognition: This patient is capable of making decisions on his own behalf.   6. Skin/Wound Care: Routine pressure relief measures. Monitor incisions   7. Fluids/Electrolytes/Nutrition: Monitor I&O and weight. Follow up labs CBC/CMP, continue vitamins/supplements.    8. 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 Completed course of dexamethasone . Patient to follow up outpatient with ENT, endocrinology and radiation oncology. ENT plan to discuss at multidisciplinary tumor board on 06/08/24.             - Will likely need Thyroidectomy and RAI-waiting for tumor board 12/16-12/17 should occur at tumor board 12/17 - waiting to hear from Oncology  12/18- waiting for note form Oncology -will reach out about it -06/12/24 see ENT note from 12/20, plan for likely thyroidectomy, outpatient f/up with PET/etc.  F/U Dr. Reyes Alexander after DC  9. Pathological right proximal humerus fracture: s/p IMR 11/29. WBAT in RUE due to needing it- use platform walker.  Ortho follow up in 2 weeks for recheck and updated xrays.  -Patient to remain in sling. 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 Patient underwent instrumentation and  fusion on prior admission. Radiation Onc plans 2 weeks of radiotherapy to thoracic spine starts 06/02/24- right humerus on 06/09/24.   12/19- to finish Radiation completely by 12/24  11. Hx of RUL segmental  PE: dx on 11/6 provoked secondary to metastatic cancer. Continue Eliquis  for 3-6 months.   12. Incomplete Paraplegia/Neurogenic bladder: Monitor bladder scan with PVR  12/12- will order in/out caths and bladder scans q6 hours- to see if needs cathing -12/13-14/25 no cathing overnight, urinating well   12/16- PVR's 4 to 126 cc in last 24 hours 12/17- Will start Flomax - we both agree he's not emptying all the time-  0.4 mg q supper- hopefully will help frequency  12/18- no PVR's- will reorder since one in last 24 hours  12/19- PVRs 98-110- doing a little better- con't flomax -- stable 12/20-23  13. Chronic normocytic anemia: Stable, continue Niferex 150 mg daily   -12/22 HGB stable 10.3   14. Constipation/mild neurogenic bowel: Continue Miralax   daily and Senokot S 2 tabs BID -06/12/24 LBM last night, states daily for the last couple days; cont regimen  -12/23 LBM yesterday, monitor  15. Polysubstance use disorder- will have palliative care f/u with meds after d/c for wean   12/16- called palliative care to help with this  16. LE edema B/L 12/17- will order ACE wraps and d/w pt elevating his legs to level of heart when not in therapy  12/18- d/w nursing-  -06/12/24 pt stating a little more swelling, advised use of ACE wraps, but will give one dose of Lasix  20mg  today-- advised that his albumin  is also low so lasix  alone may not help a ton-- encouraged protein intake.   -12/22 Discussed leg elevation, give additional lasix  20mg  today  -12/23 will do one more dose lasix  20mg     LOS: 12 days A FACE TO FACE EVALUATION WAS PERFORMED  Murray Collier 06/14/2024, 10:22 AM     "

## 2024-06-14 NOTE — Progress Notes (Signed)
 "                                                                                                                                                        Daily Progress Note   Patient Name: Joseph [name]       Date: 06/14/2024 DOB: Jul 31, 1980  Age: 43 y.o. MRN#: [id number] Attending Physician: Joseph Bouchard, MD Primary Care Physician: Patient, No Pcp Per Admit Date: 06/02/2024  Reason for Consultation/Follow-up: Pain control  Subjective: Medical records reviewed including progress notes, labs, imaging, MAR. Patient assessed at the bedside.  He reports pain level of 4 out of 10 during my visit.  Per MAR, he has required 4 doses of as needed oxycodone  in the past 24 hours.  Patient reports he tries to wait as long as he can before taking his as needed oxycodone .  Created space and opportunity for patient's thoughts and feelings on patient's current illness.  Patient denies any adverse effects after increasing dose of his methadone  yesterday.  We discussed whether he would like to attempt going down to 20 mg of as needed oxycodone , or continue with orders for 30 mg as needed until the next adjustment of his methadone .  He would like to continue with the current plan for now.  He is looking forward to assisting the unit and being seen into tomorrow.  Transportation arrived for his trip to radiation at the end of our visit.  No other needs at this time.  Questions and concerns addressed. PMT will continue to support holistically.   Length of Stay: 12   Physical Exam Vitals and nursing note reviewed.  Constitutional:      General: He is not in acute distress. HENT:     Head: Normocephalic and atraumatic.  Cardiovascular:     Rate and Rhythm: Normal rate.  Pulmonary:     Effort: Pulmonary effort is normal. No respiratory distress.  Skin:    General: Skin is warm and dry.  Neurological:     Mental Status: He is alert and oriented to person, place, and time.  Psychiatric:        Mood and  Affect: Mood normal.        Behavior: Behavior normal.            Vital Signs: BP 126/82 (BP Location: Right Wrist)   Pulse 89   Temp 98.4 F (36.9 C) (Oral)   Resp 19   Ht 5' 9 (1.753 m)   Wt (!) 148.7 kg   SpO2 99%   BMI 48.41 kg/m  SpO2: SpO2: 99 % O2 Device: O2 Device: Room Air O2 Flow Rate:    Palliative Care Assessment & Plan   Patient Profile: 43 y.o. 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    Patient was admitted 11/5-11/24 for post-op management of posterior T1-3 instrumentation fusion T2 corpectomy for resection of tumor, acute PE, thyroid  biopsy confirming papillary carcinoma, acute blood loss anemia due to procedure. He was then in CIR from 11/24-11/28, though participation in therapy was limited due to an acute fracture/metastasis. Patient was seen by PMT for pain control during readmission 11/29-12/11 for pathological fracture of right humerus. He has been in CIR since then with upcoming tumor board to discuss thyroidectomy.    PMT has been consulted to assist with acute cancer-related pain management.  Assessment: Acute on chronic right shoulder pain due to pathological fracture, improving Metastatic thyroid  cancer  Recommendations/Plan: Continue methadone  17.5 mg p.o. every 8 hours.  Next dose increased on 06/16/24 Continue oxycodone  30 mg p.o. to every 4 hours as needed for breakthrough pain Continue Tylenol  1000 mg p.o. 3 times daily Psychosocial and emotional support provided PMT will continue to follow and support   Prognosis:  Unable to determine  Discharge Planning: Home with Palliative Services          Lexington Krotz Joseph Fell, PA-C  Palliative Medicine Team Team phone # [phone]  Thank you  for allowing the Palliative Medicine Team to assist in the care of this patient. Please utilize secure chat with additional questions, if there is no response within 30 minutes please call the above phone number.  Palliative Medicine Team providers are available by phone from 7am to 7pm daily and can be reached through the team cell phone.  Should this patient require assistance outside of these hours, please call the patient's attending physician.    Billing based on MDM: Moderate   "

## 2024-06-15 ENCOUNTER — Encounter: Payer: Self-pay | Admitting: Radiation Oncology

## 2024-06-15 ENCOUNTER — Ambulatory Visit: Payer: MEDICAID

## 2024-06-15 ENCOUNTER — Other Ambulatory Visit: Payer: Self-pay

## 2024-06-15 DIAGNOSIS — K592 Neurogenic bowel, not elsewhere classified: Secondary | ICD-10-CM

## 2024-06-15 DIAGNOSIS — M7989 Other specified soft tissue disorders: Secondary | ICD-10-CM

## 2024-06-15 DIAGNOSIS — Z510 Encounter for antineoplastic radiation therapy: Secondary | ICD-10-CM | POA: Diagnosis not present

## 2024-06-15 DIAGNOSIS — M792 Neuralgia and neuritis, unspecified: Secondary | ICD-10-CM

## 2024-06-15 LAB — RAD ONC ARIA SESSION SUMMARY
Course Elapsed Days: 13
Plan Fractions Treated to Date: 10
Plan Fractions Treated to Date: 5
Plan Prescribed Dose Per Fraction: 3 Gy
Plan Prescribed Dose Per Fraction: 4 Gy
Plan Total Fractions Prescribed: 10
Plan Total Fractions Prescribed: 5
Plan Total Prescribed Dose: 20 Gy
Plan Total Prescribed Dose: 30 Gy
Reference Point Dosage Given to Date: 20 Gy
Reference Point Dosage Given to Date: 30 Gy
Reference Point Session Dosage Given: 3 Gy
Reference Point Session Dosage Given: 4 Gy
Session Number: 10

## 2024-06-15 NOTE — Plan of Care (Signed)
" °  Problem: Consults Goal: RH SPINAL CORD INJURY PATIENT EDUCATION Description:  See Patient Education module for education specifics.  Outcome: Progressing   Problem: SCI BOWEL ELIMINATION Goal: RH STG MANAGE BOWEL WITH ASSISTANCE Description: STG Manage Bowel with supervision Assistance. Outcome: Progressing   Problem: SCI BLADDER ELIMINATION Goal: RH STG MANAGE BLADDER WITH ASSISTANCE Description: STG Manage Bladder With supervision Assistance Outcome: Progressing   Problem: RH SKIN INTEGRITY Goal: RH STG SKIN FREE OF INFECTION/BREAKDOWN Description: Manage skin free from infection with supervision assistance Outcome: Progressing   Problem: RH SAFETY Goal: RH STG ADHERE TO SAFETY PRECAUTIONS W/ASSISTANCE/DEVICE Description: STG Adhere to Safety Precautions With supervision Assistance/Device. Outcome: Progressing   Problem: RH PAIN MANAGEMENT Goal: RH STG PAIN MANAGED AT OR BELOW PT'S PAIN GOAL Description: <4 w/ prns Outcome: Progressing   Problem: RH KNOWLEDGE DEFICIT SCI Goal: RH STG INCREASE KNOWLEDGE OF SELF CARE AFTER SCI Outcome: Progressing   "

## 2024-06-15 NOTE — Progress Notes (Signed)
 Physical Therapy Session Note  Patient Details  Name: Joseph [name] MRN: [id number] Date of Birth: [birth year]  Today's Date: 06/15/2024 PT Individual Time: 8954-8854 PT Individual Time Calculation (min): 60 min   Short Term Goals: Week 2:  PT Short Term Goal 1 (Week 2): Pt will ambulate 50 ft without need for w/c follow PT Short Term Goal 2 (Week 2): Pt will tolerate x 10 min activity without rest break PT Short Term Goal 3 (Week 2): Pt will perform STS with supervision without UE support consistently  Skilled Therapeutic Interventions/Progress Updates:    Pt seated in w/c on arrival and agreeable to therapy. Pt reports 4/10 pain, premedicated. Rest and positioning provided as needed. Propelled w/c out of room, therapist took over for time management.   Pt participated in Sit to stand x 10 with no UE support followed by x 10 with active assist OH reach in standing, supervision overall. Pt then participated in dance group for global strength and endurance. Pt participated x 40 min and performed x 2 songs in standing. Pt performed a variety of upper and lower extremity exercise to music. In standing, pt able to perform short bouts with no UE support while performing marching/stepping in place!   Pt returned to room and remained in w/c to await radiation transport, pt reporting extreme fatigue. Pt missed x 15 min d/t extreme fatigue and scheduled transport.   Therapy Documentation Precautions:  Precautions Precautions: Fall, Back, Shoulder Type of Shoulder Precautions: WBAT R UE Shoulder Interventions: Shoulder sling/immobilizer Precaution Booklet Issued: Yes (comment) Recall of Precautions/Restrictions: Intact Required Braces or Orthoses: Sling Restrictions Weight Bearing Restrictions Per Provider Order: No RUE Weight Bearing Per Provider Order: Weight bearing as tolerated RLE Weight Bearing Per Provider Order: Weight bearing as tolerated LLE Weight Bearing Per Provider Order:  Weight bearing as tolerated Other Position/Activity Restrictions: Per Dr. Dozier We will allow weightbearing to tolerance given his lower extremity paraplegia and need for his upper extremities General:       Therapy/Group: Individual Therapy  [name] 06/15/2024, 11:13 AM

## 2024-06-15 NOTE — Progress Notes (Addendum)
 "                                                        PROGRESS NOTE   Subjective/Complaints:  Ongoing LE edema. Legs look a little better this morning than yesterday evening. Pain seems to be under reasonable control  ROS: Patient denies fever, rash, sore throat, blurred vision, dizziness, nausea, vomiting, diarrhea, cough, shortness of breath or chest pain,  headache, or mood change.     Objective:   No results found.  Recent Labs    06/13/24 0438  WBC 5.1  HGB 10.3*  HCT 32.2*  PLT PLATELET CLUMPS NOTED ON SMEAR, UNABLE TO ESTIMATE     Recent Labs    06/13/24 0438  NA 137  K 4.2  CL 100  CO2 31  GLUCOSE 99  BUN 11  CREATININE 0.68  CALCIUM 8.9     Intake/Output Summary (Last 24 hours) at 06/15/2024 1211 Last data filed at 06/15/2024 9167 Gross per 24 hour  Intake 240 ml  Output 1025 ml  Net -785 ml        Physical Exam: Vital Signs Blood pressure 129/73, pulse 86, temperature 97.8 F (36.6 C), temperature source Oral, resp. rate 18, height 5' 9 (1.753 m), weight (!) 148.7 kg, SpO2 97%.   Constitutional: No distress . Vital signs reviewed. HEENT: NCAT, EOMI, oral membranes moist Neck: supple Cardiovascular: RRR without murmur. No JVD    Respiratory/Chest: CTA Bilaterally without wheezes or rales. Normal effort    GI/Abdomen: BS +, non-tender, non-distended Ext: no clubbing, cyanosis, 2+ edema bilateral LE, 1+ BUE Psych: pleasant and cooperative  Skin: right arm cdi, back incision closed Musculoskeletal:     Cervical back: Neck supple.  Neuro:     LUE 5/5 RUE- at least 3/5 proximally; and 5-/5 distally- limited by pain and surgery of proximal humerus RLE- HF 3-/5; KE/KF 4+/5; DF 4/5 and PF 3-/5 LLE- HF 3+/5; KE/KF 5-/5 and DF/PF 5-/5   clonus B/L No hoffman's MAS of 1 in LE's-  Intact to light touch in all 4 extremities   Assessment/Plan: 1. Functional deficits which require 3+ hours per day of interdisciplinary therapy in a  comprehensive inpatient rehab setting. Physiatrist is providing close team supervision and 24 hour management of active medical problems listed below. Physiatrist and rehab team continue to assess barriers to discharge/monitor patient progress toward functional and medical goals  Care Tool:  Bathing    Body parts bathed by patient: Right arm, Chest, Abdomen, Front perineal area, Right upper leg, Left upper leg, Right lower leg, Left lower leg, Face, Left arm   Body parts bathed by helper: Buttocks     Bathing assist Assist Level: Minimal Assistance - Patient > 75%     Upper Body Dressing/Undressing Upper body dressing   What is the patient wearing?: Pull over shirt    Upper body assist Assist Level: Supervision/Verbal cueing    Lower Body Dressing/Undressing Lower body dressing      What is the patient wearing?: Pants     Lower body assist Assist for lower body dressing: Supervision/Verbal cueing     Toileting Toileting    Toileting assist Assist for toileting: Moderate Assistance - Patient 50 - 74%     Transfers Chair/bed transfer  Transfers assist     Chair/bed  transfer assist level: Contact Guard/Touching assist     Locomotion Ambulation   Ambulation assist      Assist level: Contact Guard/Touching assist Assistive device: Walker-rolling Max distance: 41ft   Walk 10 feet activity   Assist     Assist level: Contact Guard/Touching assist Assistive device: Walker-rolling   Walk 50 feet activity   Assist Walk 50 feet with 2 turns activity did not occur: Safety/medical concerns (weakness/fatigue/pain)  Assist level: Contact Guard/Touching assist Assistive device: Walker-rolling    Walk 150 feet activity   Assist Walk 150 feet activity did not occur: Safety/medical concerns (weakness/fatigue/pain)         Walk 10 feet on uneven surface  activity   Assist Walk 10 feet on uneven surfaces activity did not occur: Safety/medical  concerns (weakness/fatigue/pain)         Wheelchair     Assist Is the patient using a wheelchair?: Yes Type of Wheelchair: Manual    Wheelchair assist level: Maximal Assistance - Patient 25 - 49% (B LE and L UE) Max wheelchair distance: 10 ft    Wheelchair 50 feet with 2 turns activity    Assist        Assist Level: Total Assistance - Patient < 25%   Wheelchair 150 feet activity     Assist      Assist Level: Total Assistance - Patient < 25%   Blood pressure 129/73, pulse 86, temperature 97.8 F (36.6 C), temperature source Oral, resp. rate 18, height 5' 9 (1.753 m), weight (!) 148.7 kg, SpO2 97%.   Medical Problem List and Plan: 1. 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              -patient may  shower             -ELOS/Goals: ~ 14 days- supervision             Admit to CIR Appropriate for CIR- didn't finish first time due to R humeral pathological fx due to Thyroid  cancer mets  Tumor board hopefully meeting soon- haven't heard from Oncology  D/c 12/30 Continue ACE wraps- written for- educated pt and nursing that needs them for swelling. Needs to keep elevating legs  -Continue CIR therapies including PT, OT   2.  Antithrombotics: -DVT/anticoagulation:  Mechanical: Sequential compression devices, below knee Bilateral lower extremities Pharmaceutical: Eliquis  5mg  BID due to PE 11/10- RUL segmental PE             -antiplatelet therapy: N/A   3. Pain Management: Robaxin  750 TID AC/HS, Lyrica  50 mg TID, Methadone  10 mg TID. Flexeril  10mg  TID, lidoderm  patches. Oxycodone  30 mg q3 hours as needed for breakthrough pain. Palliative care to follow.  12/16- Called Palliative care- will see if can increase Methadone  with goal to reduce Oxycodone  - see if can move Oxy to q4 hours? con't regimen for now, but called and placed consult 12/18- Methadone  up to 12.5 mg q8 hours and Oxycodone   30 mg q3 hours- 5 dose sin last 24 hours- they also started scheduled Tylenol  and stopped Robaxin - weaning flexeril  12/19- increased methadone  today- on 15 methadone  q8 hours- and Oxy 30 mg q4 hours- to change dosing Monday 12/22-25 pain controlled, continue current regimen and monitor   4. Mood/Behavior/Sleep: LCSW to follow for evaluation and support when available.              -antipsychotic agents: n/a   -Trazodone  PRN  5. Neuropsych/cognition: This patient is capable of making decisions on his own behalf.   6. Skin/Wound Care: Routine pressure relief measures. Monitor incisions   7. Fluids/Electrolytes/Nutrition: Monitor I&O and weight. Follow up labs CBC/CMP, continue vitamins/supplements.    8. 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 Completed course of dexamethasone . Patient to follow up outpatient with ENT, endocrinology and radiation oncology. ENT plan to discuss at multidisciplinary tumor board on 06/08/24.             - Will likely need Thyroidectomy and RAI-waiting for tumor board 12/16-12/17 should occur at tumor board 12/17 - waiting to hear from Oncology  12/18- waiting for note form Oncology -will reach out about it -06/12/24 see ENT note from 12/20, plan for likely thyroidectomy, outpatient f/up with PET/etc.  F/U Dr. Reyes Alexander after DC  9. Pathological right proximal humerus fracture: s/p IMR 11/29. WBAT in RUE due to needing it- use platform walker.  Ortho follow up in 2 weeks for recheck and updated xrays.  -Patient to remain in sling. 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 Patient underwent instrumentation and fusion on prior admission. Radiation Onc plans 2 weeks of radiotherapy to thoracic spine starts 06/02/24- right humerus on 06/09/24.   12/19- to finish Radiation completely by 12/24  11. Hx of RUL segmental  PE: dx on 11/6 provoked  secondary to metastatic cancer. Continue Eliquis  for 3-6 months.   12. Incomplete Paraplegia/Neurogenic bladder: Monitor bladder scan with PVR  12/12- will order in/out caths and bladder scans q6 hours- to see if needs cathing -12/13-14/25 no cathing overnight, urinating well   12/16- PVR's 4 to 126 cc in last 24 hours 12/17- Will start Flomax - we both agree he's not emptying all the time-  0.4 mg q supper- hopefully will help frequency  12/24 voiding with low PVR's 13. Chronic normocytic anemia: Stable, continue Niferex 150 mg daily   -12/22 HGB stable 10.3   14. Constipation/mild neurogenic bowel: Continue Miralax   daily and Senokot S 2 tabs BID -06/12/24 LBM last night, states daily for the last couple days; cont regimen  -12/23 LBM    15. Polysubstance use disorder- will have palliative care f/u with meds after d/c for wean   12/16- called palliative care to help with this  16. LE edema B/L 12/17- will order ACE wraps and d/w pt elevating his legs to level of heart when not in therapy  12/18- d/w nursing-  -06/12/24 pt stating a little more swelling, advised use of ACE wraps, but will give one dose of Lasix  20mg  today-- advised that his albumin  is also low so lasix  alone may not help a ton-- encouraged protein intake.   -12/24 has received 3 doses of lasix .    -will observe today without. If swelling increases again, consider standing dose.   -continue to elevate and wrap. Lyrica  could be contributing    LOS: 13 days A FACE TO FACE EVALUATION WAS PERFORMED  Joseph [name] 06/15/2024, 12:11 PM     "

## 2024-06-15 NOTE — Progress Notes (Signed)
 Occupational Therapy Session Note  Patient Details  Name: Joseph [name] MRN: [id number] Date of Birth: 1980/07/13  Today's Date: 06/15/2024 OT Individual Time: 9299-9254 & 0830-1000 OT Individual Time Calculation (min): 45 min & 90 min    Short Term Goals: Week 2:  OT Short Term Goal 1 (Week 2): Pt will complete bathing tasks with supervision OT Short Term Goal 2 (Week 2): Pt will complete toileting tasks with min A OT Short Term Goal 3 (Week 2): Pt will complete LB dressing tasks with supervision using AE PRN  Skilled Therapeutic Interventions/Progress Updates:      Therapy Documentation Precautions:  Precautions Precautions: Fall, Back, Shoulder Type of Shoulder Precautions: WBAT R UE Shoulder Interventions: Shoulder sling/immobilizer Precaution Booklet Issued: Yes (comment) Recall of Precautions/Restrictions: Intact Required Braces or Orthoses: Sling Restrictions Weight Bearing Restrictions Per Provider Order: No RUE Weight Bearing Per Provider Order: Weight bearing as tolerated RLE Weight Bearing Per Provider Order: Weight bearing as tolerated LLE Weight Bearing Per Provider Order: Weight bearing as tolerated Other Position/Activity Restrictions: Per Dr. Dozier We will allow weightbearing to tolerance given his lower extremity paraplegia and need for his upper extremities Session 1 General: Pt supine in bed upon OT arrival, agreeable to OT session.  Pain: 4/10 pain reported in Rt shoulder, shower, activity, intermittent rest breaks, distractions provided for pain management, pt reports tolerable to proceed.   ADL: OT providing skilled intervention on ADL retraining in order to increase independence with tasks and increase activity tolerance. Pt completed the following tasks at the current level of assist: Grooming/oral hygiene: set up seated at sink for oral hygiene UB dressing: set up for donning overhead shirt seated on hair LB dressing: SBA with use of reacher   Footwear: total A to wrap legs with ACE for edema management Shower transfer: SBA with RW ambulating from bed  Bathing: SBA with use of long handle sponge on TTB with removable shower handle   Pt seated in W/C at end of session with call light within reach and 4Ps assessed.    Session 2 General: Pt seated in W/C upon OT arrival, agreeable to OT.  Pain:  4/10 pain reported in Rt shoulder, activity, intermittent rest breaks, distractions provided for pain management, pt reports tolerable to proceed.   ADL: OT providing skilled intervention on ADL retraining in order to increase independence with tasks and increase activity tolerance. Pt completed the dressing tasks to don [place] suit with levels of assist listed above. Pt also completing multiple sit to stands and static standing without UE support at SBA.   Other Treatments: Pt participating in Christmas activity in acting as [place]. Pt able to use RUE to disperse presents to people with OT providing occasional AAROM for increased ROM and decreasing muscle stiffness.    Pt seated in W/C at end of session with call light within reach and 4Ps assessed.    Therapy/Group: Individual Therapy  Camie Hoe, OTD, OTR/L 06/15/2024, 12:27 PM

## 2024-06-16 DIAGNOSIS — Z789 Other specified health status: Secondary | ICD-10-CM

## 2024-06-16 DIAGNOSIS — S22029S Unspecified fracture of second thoracic vertebra, sequela: Secondary | ICD-10-CM

## 2024-06-16 DIAGNOSIS — Z7189 Other specified counseling: Secondary | ICD-10-CM

## 2024-06-16 LAB — CBC
HCT: 33.2 % — ABNORMAL LOW (ref 39.0–52.0)
Hemoglobin: 10.2 g/dL — ABNORMAL LOW (ref 13.0–17.0)
MCH: 28.7 pg (ref 26.0–34.0)
MCHC: 30.7 g/dL (ref 30.0–36.0)
MCV: 93.5 fL (ref 80.0–100.0)
Platelets: UNDETERMINED K/uL (ref 150–400)
RBC: 3.55 MIL/uL — ABNORMAL LOW (ref 4.22–5.81)
RDW: 14.7 % (ref 11.5–15.5)
WBC: 4.9 K/uL (ref 4.0–10.5)
nRBC: 0 % (ref 0.0–0.2)

## 2024-06-16 LAB — BASIC METABOLIC PANEL WITH GFR
Anion gap: 8 (ref 5–15)
BUN: 12 mg/dL (ref 6–20)
CO2: 28 mmol/L (ref 22–32)
Calcium: 8.9 mg/dL (ref 8.9–10.3)
Chloride: 101 mmol/L (ref 98–111)
Creatinine, Ser: 0.67 mg/dL (ref 0.61–1.24)
GFR, Estimated: >60 mL/min
Glucose, Bld: 152 mg/dL — ABNORMAL HIGH (ref 70–99)
Potassium: 4.1 mmol/L (ref 3.5–5.1)
Sodium: 137 mmol/L (ref 135–145)

## 2024-06-16 MED ORDER — METHADONE HCL 10 MG PO TABS
20.0000 mg | ORAL_TABLET | Freq: Three times a day (TID) | ORAL | Status: DC
  Administered 2024-06-16 – 2024-06-21 (×15): 20 mg via ORAL
  Filled 2024-06-16 (×11): qty 2

## 2024-06-16 MED ORDER — OXYCODONE HCL 5 MG PO TABS
20.0000 mg | ORAL_TABLET | ORAL | Status: DC | PRN
  Administered 2024-06-16 – 2024-06-17 (×5): 20 mg via ORAL
  Filled 2024-06-16 (×5): qty 4

## 2024-06-16 NOTE — Progress Notes (Signed)
 "                                                                                                                                                                                                         Daily Progress Note   Patient Name: Joseph [name]       Date: 06/16/2024 DOB: Jul 23, 1980  Age: 43 y.o. MRN#: [id number] Attending Physician: [name], MD Primary Care Physician: Patient, No Pcp Per Admit Date: 06/02/2024  Reason for Consultation/Follow-up: Pain control  Objective: I have reviewed medical records including: EPIC notes: Physical medicine, nursing, PT/OT, social worker, PMT.  Finished treatments yesterday.  Target discharge date is 12/30. MAR: As needed medications administered in the last 24 hours -oxycodone  x 4.  Patient is receiving these palliative interventions for symptom management with an intent to improve quality of life  Cancer related pain Methadone  every 8 hours titrated up to 17.5 mg on 12/22 Oxycodone  30 mg every 4 hours as needed Tylenol  1000 mg 3 times daily Flexeril  5 mg as needed  Lidocaine  patch Lyrica  3 times daily Bowel regimen Senokot 2 tablets twice per day MiraLAX  daily Fleet enema once as needed for severe constipation Bisacodyl  daily as needed moderate constipation Available advanced directives in ACP: none Labs: low albumin  2.3 assessed for overall health, nutritional status, and disease severity, helping predict prognosis and guide care Intake/output: last BM 12/24 EKG: Cannot see where EKG ordered for 12/21 or subsequent was completed.  Requested RN obtain as soon as possible. QT from EKG today is 352   Subjective: Received report from primary RN - no acute concerns.  Went to visit patient at bedside-no family/visitors present.  Patient was lying in bed awake, alert, oriented, and able to participate in conversation. No signs or non-verbal gestures of pain or discomfort noted. No respiratory distress, increased work of breathing,  or secretions noted.  Patient appears happy and in good spirits.  Therapeutic listening provided as patient reflects over interval history of course at CIR and finishing treatments for his shoulder and back yesterday.  Patient tells me rehabilitation efforts are going real good.  He describes the wonderful progress he has made.  Patient's current pain is described as 4/10-he recently received dose of oxycodone .  Symptom management plan reviewed with patient to include increasing methadone  today as well as adjusting oxycodone  to dose range of 20-30 mg every 8 hours.  Patient understands as methadone  is increased oxycodone  will be weaned as tolerated.  Patient also states, I can tell I'm getting better because the numbness in my  feet is going away.  Overall, patient is very pleased with his progress in CIR and symptom management.  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  All questions and concerns addressed.   Discussed symptom management plan with primary RN.  Length of Stay: 14  Current Medications: Scheduled Meds:   acetaminophen   1,000 mg Oral TID   apixaban   5 mg Oral BID   guaiFENesin   1,200 mg Oral BID   iron  polysaccharides  150 mg Oral Daily   lidocaine   2 patch Transdermal Daily   methadone   17.5 mg Oral Q8H   multivitamin with minerals  1 tablet Oral Daily   polyethylene glycol  17 g Oral Daily   pregabalin   50 mg Oral TID   Ensure Max Protein  11 oz Oral BID   senna-docusate  2 tablet Oral BID   tamsulosin   0.4 mg Oral QPC supper    Continuous Infusions:   PRN Meds: albuterol , alum & mag hydroxide-simeth, bisacodyl , cyclobenzaprine , diphenhydrAMINE , lidocaine , ondansetron  **OR** ondansetron  (ZOFRAN ) IV, oxyCODONE , sodium phosphate , traZODone   Physical Exam Vitals and  nursing note reviewed.  Constitutional:      General: He is not in acute distress. Pulmonary:     Effort: Pulmonary effort is normal. No respiratory distress.  Skin:    General: Skin is warm and dry.  Neurological:     Mental Status: He is alert and oriented to person, place, and time.  Psychiatric:        Attention and Perception: Attention normal.        Behavior: Behavior is cooperative.        Cognition and Memory: Cognition and memory normal.             Vital Signs: BP 123/74 (BP Location: Left Arm)   Pulse 100   Temp 98.6 F (37 C)   Resp 19   Ht 5' 9 (1.753 m)   Wt (!) 160.7 kg   SpO2 98%   BMI 52.32 kg/m  SpO2: SpO2: 98 % O2 Device: O2 Device: Room Air O2 Flow Rate:    Intake/output summary:  Intake/Output Summary (Last 24 hours) at 06/16/2024 0825 Last data filed at 06/16/2024 9480 Gross per 24 hour  Intake 960 ml  Output 850 ml  Net 110 ml   LBM: Last BM Date : 06/14/24 Baseline Weight: Weight: (!) 148.7 kg Most recent weight: Weight: (!) 160.7 kg       Palliative Assessment/Data: PPS 80%      Patient Active Problem List   Diagnosis Date Noted   Depression with anxiety 06/10/2024   Stress and adjustment reaction 06/07/2024   Spinal cord injury at T1-T6 level (HCC) 06/02/2024   Malignant neoplasm metastatic to bone (HCC) 05/24/2024   Normocytic anemia 05/21/2024   Pathologic humeral fracture 05/21/2024   Pathological fracture of right humerus 05/20/2024   Acute incomplete paraplegia (HCC) 05/17/2024   Pathologic fracture of thoracic vertebrae 05/16/2024   Papillary thyroid  carcinoma (HCC) 05/10/2024   Malignant neoplasm metastatic to both lungs (HCC) 05/10/2024   Metastatic cancer to spine (HCC) 05/06/2024   Papillary carcinoma (HCC) 05/06/2024   Thoracic spine fracture (HCC) 05/02/2024   Ataxia 05/01/2024   Right shoulder pain 05/01/2024   Pulmonary HTN (HCC) 04/29/2024   Low back pain 04/29/2024   Pulmonary nodules  04/29/2024    Thyroid  nodule 04/29/2024   Pulmonary embolism (HCC) 04/28/2024   Abscess 03/31/2018   Thrombocytopenia 03/31/2018   Depression 03/31/2018   Abscess of hand 03/31/2018   Abscess of right hand 03/31/2018   GAD (generalized anxiety disorder) 12/04/2014   Alcohol use disorder, severe, dependence (HCC) 12/03/2014   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    Patient was admitted 11/5-11/24 for post-op management of posterior T1-3 instrumentation fusion T2 corpectomy for resection of tumor, acute PE, thyroid  biopsy confirming papillary carcinoma, acute blood loss anemia due to procedure. He was then in CIR from 11/24-11/28, though participation in therapy was limited due to an acute fracture/metastasis. Patient was seen by PMT for pain control during readmission 11/29-12/11 for pathological fracture of right humerus. He has been in CIR since then with upcoming tumor board to discuss thyroidectomy.    PMT has been consulted to assist with acute cancer-related pain management.  Assessment: Principal Problem:   Acute incomplete paraplegia (HCC) Active Problems:   Spinal cord injury at T1-T6 level [facility])   Stress and adjustment reaction   Depression with anxiety   Recommendations/Plan: Increase methadone  to 20 mg every 8 hours.  Follow-up EKG ordered for tomorrow 12/26 for QTc monitoring Adjust  oxycodone  to dose range 20-30 mg every 4 hours as needed for breakthrough pain - goal to wean as tolerated with methadone  increases Continue other current pain and bowel regimen per [facility] PMT will continue to follow and support holistically. Anticipate next methadone  increase on 12/28  Goals of Care and Additional Recommendations: Limitations on Scope of Treatment: Full Scope Treatment  Code Status:    Code Status Orders  (From admission, onward)           Start     Ordered   06/02/24 1758  Full code  Continuous       Question:  By:  Answer:  Consent: discussion documented in EHR   06/02/24 1757           Code Status History     Date Active Date Inactive Code Status Order ID Comments User Context   05/21/2024 0131 06/02/2024 1751 Full Code [id number]  Charlton Evalene RAMAN, MD Inpatient   05/16/2024 1529 05/21/2024 0023 Full Code [id number]  Pegge Toribio PARAS, PA-C Inpatient   05/16/2024 1529 05/16/2024 1529 Full Code [id number]  Pegge Toribio PARAS, PA-C Inpatient   04/28/2024 2016 05/16/2024 1523 Full Code [id number]  Arthea Child, MD Inpatient   03/31/2018 2330 04/02/2018 1721 Full Code [id number]  Alfornia Madison, MD Inpatient   12/02/2014 2154 12/07/2014 0901 Full Code [id number]  Cathryne Sherrell BRAVO, NP Inpatient   12/02/2014 1731 12/02/2014 2154 Full Code [id number]  Vicky Lamar [facility]       Prognosis:  Unable to determine  Discharge Planning: To Be Determined  Care plan was discussed with primary RN, patient  Thank you for allowing the Palliative Medicine Team to assist in the care of this patient.   Total  Time 50 minutes Prolonged Time Billed  no       Jeoffrey CHRISTELLA Sharps, NP  Please contact Palliative Medicine Team phone at [phone] for questions and concerns.   *Portions of this note are a verbal dictation therefore any spelling and/or grammatical errors are due to the Dragon [employer] One system interpretation.     "

## 2024-06-16 NOTE — Plan of Care (Signed)
" °  Problem: Consults Goal: RH SPINAL CORD INJURY PATIENT EDUCATION Description:  See Patient Education module for education specifics.  Outcome: Progressing   Problem: SCI BOWEL ELIMINATION Goal: RH STG MANAGE BOWEL WITH ASSISTANCE Description: STG Manage Bowel with supervision Assistance. Outcome: Progressing   Problem: SCI BLADDER ELIMINATION Goal: RH STG MANAGE BLADDER WITH ASSISTANCE Description: STG Manage Bladder With supervision Assistance Outcome: Progressing   Problem: RH SKIN INTEGRITY Goal: RH STG SKIN FREE OF INFECTION/BREAKDOWN Description: Manage skin free from infection with supervision assistance Outcome: Progressing   Problem: RH SAFETY Goal: RH STG ADHERE TO SAFETY PRECAUTIONS W/ASSISTANCE/DEVICE Description: STG Adhere to Safety Precautions With supervision Assistance/Device. Outcome: Progressing   Problem: RH PAIN MANAGEMENT Goal: RH STG PAIN MANAGED AT OR BELOW PT'S PAIN GOAL Description: <4 w/ prns Outcome: Progressing   Problem: RH KNOWLEDGE DEFICIT SCI Goal: RH STG INCREASE KNOWLEDGE OF SELF CARE AFTER SCI Outcome: Progressing   "

## 2024-06-16 NOTE — Progress Notes (Signed)
 "                                                        PROGRESS NOTE   Subjective/Complaints:  No new issues. Happy about finishing treatments yesterday!. Has mask in room. Pain controlled. Swelling about the same  ROS: Patient denies fever, rash, sore throat, blurred vision, dizziness, nausea, vomiting, diarrhea, cough, shortness of breath or chest pain,   headache, or mood change.     Objective:   No results found.  Recent Labs    06/16/24 0543  WBC 4.9  HGB 10.2*  HCT 33.2*  PLT PLATELET CLUMPS NOTED ON SMEAR, UNABLE TO ESTIMATE     Recent Labs    06/16/24 0543  NA 137  K 4.1  CL 101  CO2 28  GLUCOSE 152*  BUN 12  CREATININE 0.67  CALCIUM 8.9     Intake/Output Summary (Last 24 hours) at 06/16/2024 0711 Last data filed at 06/16/2024 0519 Gross per 24 hour  Intake 960 ml  Output 850 ml  Net 110 ml        Physical Exam: Vital Signs Blood pressure 123/74, pulse 100, temperature 98.6 F (37 C), resp. rate 19, height 5' 9 (1.753 m), weight (!) 148.7 kg, SpO2 98%.   Constitutional: No distress . Vital signs reviewed. HEENT: NCAT, EOMI, oral membranes moist Neck: supple Cardiovascular: RRR without murmur. No JVD    Respiratory/Chest: CTA Bilaterally without wheezes or rales. Normal effort    GI/Abdomen: BS +, non-tender, non-distended Ext: no clubbing, cyanosis, 2+ LE edema, 1+ UE Psych: pleasant and cooperative  Skin: right arm cdi, back incision closed Musculoskeletal:     Cervical back: Neck supple.  Neuro:     LUE 5/5 RUE- at least 3/5 proximally; and 5-/5 distally- limited by pain and surgery of proximal humerus RLE- HF 3-/5; KE/KF 4+/5; DF 4/5 and PF 3-/5 LLE- HF 3+/5; KE/KF 5-/5 and DF/PF 5-/5   clonus B/L--no changes No hoffman's MAS of 1 in LE's-  Intact to light touch in all 4 extremities   Assessment/Plan: 1. Functional deficits which require 3+ hours per day of interdisciplinary therapy in a comprehensive inpatient rehab  setting. Physiatrist is providing close team supervision and 24 hour management of active medical problems listed below. Physiatrist and rehab team continue to assess barriers to discharge/monitor patient progress toward functional and medical goals  Care Tool:  Bathing    Body parts bathed by patient: Right arm, Chest, Abdomen, Front perineal area, Right upper leg, Left upper leg, Right lower leg, Left lower leg, Face, Left arm   Body parts bathed by helper: Buttocks     Bathing assist Assist Level: Minimal Assistance - Patient > 75%     Upper Body Dressing/Undressing Upper body dressing   What is the patient wearing?: Pull over shirt    Upper body assist Assist Level: Supervision/Verbal cueing    Lower Body Dressing/Undressing Lower body dressing      What is the patient wearing?: Pants     Lower body assist Assist for lower body dressing: Supervision/Verbal cueing     Toileting Toileting    Toileting assist Assist for toileting: Moderate Assistance - Patient 50 - 74%     Transfers Chair/bed transfer  Transfers assist     Chair/bed transfer assist level: Contact  Guard/Touching assist     Locomotion Ambulation   Ambulation assist      Assist level: Contact Guard/Touching assist Assistive device: Walker-rolling Max distance: 37ft   Walk 10 feet activity   Assist     Assist level: Contact Guard/Touching assist Assistive device: Walker-rolling   Walk 50 feet activity   Assist Walk 50 feet with 2 turns activity did not occur: Safety/medical concerns (weakness/fatigue/pain)  Assist level: Contact Guard/Touching assist Assistive device: Walker-rolling    Walk 150 feet activity   Assist Walk 150 feet activity did not occur: Safety/medical concerns (weakness/fatigue/pain)         Walk 10 feet on uneven surface  activity   Assist Walk 10 feet on uneven surfaces activity did not occur: Safety/medical concerns  (weakness/fatigue/pain)         Wheelchair     Assist Is the patient using a wheelchair?: Yes Type of Wheelchair: Manual    Wheelchair assist level: Maximal Assistance - Patient 25 - 49% (B LE and L UE) Max wheelchair distance: 10 ft    Wheelchair 50 feet with 2 turns activity    Assist        Assist Level: Total Assistance - Patient < 25%   Wheelchair 150 feet activity     Assist      Assist Level: Total Assistance - Patient < 25%   Blood pressure 123/74, pulse 100, temperature 98.6 F (37 C), resp. rate 19, height 5' 9 (1.753 m), weight (!) 148.7 kg, SpO2 98%.   Medical Problem List and Plan: 1. 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              -patient may  shower             -ELOS/Goals: ~ 12/30- supervision             Admit to CIR Appropriate for CIR- didn't finish first time due to R humeral pathological fx due to Thyroid  cancer mets  Tumor board hopefully meeting soon- haven't heard from Oncology  Continue ACE wraps- written for- educated pt and nursing that needs them for swelling. Needs to keep elevating legs  -Continue CIR therapies including PT, OT   2.  Antithrombotics: -DVT/anticoagulation:  Mechanical: Sequential compression devices, below knee Bilateral lower extremities Pharmaceutical: Eliquis  5mg  BID due to PE 11/10- RUL segmental PE             -antiplatelet therapy: N/A   3. Pain Management: Robaxin  750 TID AC/HS, Lyrica  50 mg TID, Methadone  10 mg TID. Flexeril  10mg  TID, lidoderm  patches. Oxycodone  30 mg q3 hours as needed for breakthrough pain. Palliative care to follow.  12/16- Called Palliative care- will see if can increase Methadone  with goal to reduce Oxycodone  - see if can move Oxy to q4 hours? con't regimen for now, but called and placed consult 12/18- Methadone  up to 12.5 mg q8 hours and Oxycodone  30 mg q3 hours- 5 dose sin last 24 hours- they  also started scheduled Tylenol  and stopped Robaxin - weaning flexeril  12/19- increased methadone  today- on 15 methadone  q8 hours- and Oxy 30 mg q4 hours- to change dosing Monday 12/22-25 pain controlled, continue current regimen and monitor   4. Mood/Behavior/Sleep: LCSW to follow for evaluation and support when available.              -antipsychotic agents: n/a   -Trazodone  PRN  5. Neuropsych/cognition: This patient is capable of making decisions on  his own behalf.   6. Skin/Wound Care: Routine pressure relief measures. Monitor incisions   7. Fluids/Electrolytes/Nutrition: Monitor I&O and weight. Follow up labs CBC/CMP, continue vitamins/supplements.    8. 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 Completed course of dexamethasone . Patient to follow up outpatient with ENT, endocrinology and radiation oncology. ENT plan to discuss at multidisciplinary tumor board on 06/08/24.             - Will likely need Thyroidectomy and RAI-waiting for tumor board 12/16-12/17 should occur at tumor board 12/17 - waiting to hear from Oncology  12/18- waiting for note form Oncology -will reach out about it -06/12/24 see ENT note from 12/20, plan for likely thyroidectomy, outpatient f/up with PET/etc.  F/U Dr. Reyes [name] after DC  9. Pathological right proximal humerus fracture: s/p IMR 11/29. WBAT in RUE due to needing it- use platform walker.  Ortho follow up in 2 weeks for recheck and updated xrays.  -Patient to remain in sling. 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 Patient underwent instrumentation and fusion on prior admission. Radiation Onc plans 2 weeks of radiotherapy to thoracic spine starts 06/02/24- right humerus on 06/09/24.   12/19- to finish Radiation completely by 12/24  11. Hx of RUL segmental  PE: dx on 11/6 provoked secondary to metastatic cancer. Continue Eliquis  for  3-6 months.   12. Incomplete Paraplegia/Neurogenic bladder: Monitor bladder scan with PVR  12/12- will order in/out caths and bladder scans q6 hours- to see if needs cathing -12/13-14/25 no cathing overnight, urinating well   12/16- PVR's 4 to 126 cc in last 24 hours 12/17- Will start Flomax - we both agree he's not emptying all the time-  0.4 mg q supper- hopefully will help frequency  12/24-25 voiding continently with low PVR's 13. Chronic normocytic anemia: Stable, continue Niferex 150 mg daily   -12/25 HGB stable 10.4   14. Constipation/mild neurogenic bowel: Continue Miralax   daily and Senokot S 2 tabs BID -06/12/24 LBM last night, states daily for the last couple days; cont regimen  -12/23 LBM    15. Polysubstance use disorder- will have palliative care f/u with meds after d/c for wean   12/16- called palliative care to help with this  16. LE edema B/L 12/17- will order ACE wraps and d/w pt elevating his legs to level of heart when not in therapy  12/18- d/w nursing-  -06/12/24 pt stating a little more swelling, advised use of ACE wraps, but will give one dose of Lasix  20mg  today-- advised that his albumin  is also low so lasix  alone may not help a ton-- encouraged protein intake.  Filed Weights   06/02/24 1700  Weight: (!) 148.7 kg     -12/25 has received 3 doses of lasix .    -no lasix  yesterday. Looks and feels about the same   -continue to elevate and wrap. Lyrica  could be contributing   -check weight today   LOS: 14 days A FACE TO FACE EVALUATION WAS PERFORMED  Joseph [name] 06/16/2024, 7:11 AM     "

## 2024-06-17 MED ORDER — FUROSEMIDE 40 MG PO TABS
40.0000 mg | ORAL_TABLET | Freq: Every day | ORAL | Status: DC
  Administered 2024-06-17 – 2024-06-21 (×5): 40 mg via ORAL
  Filled 2024-06-17 (×3): qty 1

## 2024-06-17 MED ORDER — OXYCODONE HCL 5 MG PO TABS
15.0000 mg | ORAL_TABLET | ORAL | Status: DC | PRN
  Administered 2024-06-17 – 2024-06-18 (×6): 20 mg via ORAL
  Administered 2024-06-19: 15 mg via ORAL
  Administered 2024-06-19 – 2024-06-20 (×7): 20 mg via ORAL
  Administered 2024-06-21: 15 mg via ORAL
  Filled 2024-06-17 (×5): qty 4
  Filled 2024-06-17: qty 3
  Filled 2024-06-17 (×4): qty 4

## 2024-06-17 MED ORDER — POTASSIUM CHLORIDE CRYS ER 20 MEQ PO TBCR
20.0000 meq | EXTENDED_RELEASE_TABLET | Freq: Every day | ORAL | Status: DC
  Administered 2024-06-17 – 2024-06-21 (×5): 20 meq via ORAL
  Filled 2024-06-17 (×3): qty 1

## 2024-06-17 NOTE — Plan of Care (Signed)
" °  Problem: RH Bathing Goal: LTG Patient will bathe all body parts with assist levels (OT) Description: LTG: Patient will bathe all body parts with assist levels (OT) Flowsheets (Taken 06/17/2024 0918) LTG: Pt will perform bathing with assistance level/cueing: Independent with assistive device    "

## 2024-06-17 NOTE — Discharge Summary (Signed)
 Physician Discharge Summary  Patient ID: Joseph [name] MRN: [id number] DOB/AGE: 43-Feb-1982 43 y.o.  Admit date: 06/02/2024 Discharge date: 06/20/2024  Discharge Diagnoses:  Principal Problem:   Acute incomplete paraplegia [facility]) Active Problems:   GAD (generalized anxiety disorder)   Thrombocytopenia   Depression   Pulmonary embolism (HCC)   Pulmonary HTN (HCC)   Low back pain   Thyroid  nodule   Ataxia   Metastatic cancer to spine (HCC)   Papillary thyroid  carcinoma (HCC)   Malignant neoplasm metastatic to both lungs [facility])   Pathologic fracture of thoracic vertebrae   Pathological fracture of right humerus   Normocytic anemia   Malignant neoplasm metastatic to bone (HCC)   Spinal cord injury at T1-T6 level (HCC)   Stress and adjustment reaction   Depression with anxiety   Iron  deficiency anemia   Discharged Condition: stable  Significant Diagnostic Studies: DG Humerus Right Result Date: 06/07/2024 CLINICAL DATA:  Humeral fracture, postop. EXAM: RIGHT HUMERUS - 2+ VIEW COMPARISON:  Preoperative imaging FINDINGS: Intramedullary nail with proximal and distal locking screw fixation traverse pathologic fracture of the proximal humerus. Stable fracture alignment. No new periprosthetic lucency. Proximal and distal skin staples in place. Multiple small linear densities in the soft tissues adjacent to the distal humerus may represent needle fragments, present on prior exam. IMPRESSION: ORIF of pathologic proximal humerus fracture. No hardware complication. Electronically Signed   By: Andrea Gasman M.D.   On: 06/07/2024 13:33   CT SOFT TISSUE NECK W CONTRAST Result Date: 05/26/2024 EXAM: CT NECK WITH CONTRAST 05/25/2024 03:14:00 PM TECHNIQUE: CT of the neck was performed with the administration of 75 mL of iohexol  (OMNIPAQUE ) 350 MG/ML injection. Multiplanar reformatted images are provided for review. Automated exposure control, iterative reconstruction, and/or weight based  adjustment of the mA/kV was utilized to reduce the radiation dose to as low as reasonably achievable. COMPARISON: None available. CLINICAL HISTORY: Thyroid  cancer. FINDINGS: AERODIGESTIVE TRACT: No discrete mass. No edema. SALIVARY GLANDS: The parotid and submandibular glands are unremarkable. THYROID : Markedly enlarged left lobe of the thyroid  gland, which has been previously sampled by FNA. LYMPH NODES: No suspicious cervical lymphadenopathy. SOFT TISSUES: No mass or fluid collection. BRAIN, ORBITS, SINUSES AND MASTOIDS: No acute abnormality. LUNGS AND MEDIASTINUM: No acute abnormality. BONES: Posterior fusion hardware of the upper thoracic spine. There is a lucent lesion within the C6 vertebra replacing most of the normal bone marrow. Smaller lesion noted within the dorsal C5 vertebral body. No current evidence of pathologic fracture. IMPRESSION: 1. Markedly enlarged left thyroid  lobe, previously sampled by FNA. 2. C5 and C6 vertebral body metastases without pathologic fracture. 3. No cervical lymphadenopathy. Electronically signed by: Franky Stanford MD 05/26/2024 12:37 AM EST RP Workstation: [id number]   DG CHEST PORT 1 VIEW Result Date: 05/22/2024 CLINICAL DATA:  Respiratory compromise EXAM: PORTABLE CHEST 1 VIEW COMPARISON:  Chest x-ray 05/22/2024 FINDINGS: Lung volumes are low. This likely accentuating central pulmonary vascularity. There is no lung consolidation, pleural effusion or pneumothorax. Cardiomediastinal silhouette is within normal limits. Cervical/thoracic spinal fusion hardware is present. No acute fractures are seen. IMPRESSION: Low lung volumes. No acute cardiopulmonary process. Electronically Signed   By: Greig Pique M.D.   On: 05/22/2024 22:52   DG CHEST PORT 1 VIEW Result Date: 05/22/2024 EXAM: 1 VIEW(S) XRAY OF THE CHEST 05/22/2024 03:42:00 PM COMPARISON: 05/12/2024 CLINICAL HISTORY: Fever FINDINGS: LUNGS AND PLEURA: Low lung volumes. Elevated right hemidiaphragm. Bilateral  pulmonary nodules better visualized on prior chest CT. Mild pulmonary vascular congestion is  slightly improved from the prior exam. No pleural effusion. No pneumothorax. HEART AND MEDIASTINUM: No acute abnormality of the cardiac and mediastinal silhouettes. BONES AND SOFT TISSUES: Cervicothoracic surgical hardware noted. No acute osseous abnormality. IMPRESSION: 1. Mild pulmonary vascular congestion, slightly improved from the prior exam. 2. Otherwise stable exam Electronically signed by: Lonni Necessary MD 05/22/2024 07:57 PM EST RP Workstation: [id number]   DG Humerus Right Result Date: 05/21/2024 CLINICAL DATA:  Operative imaging from right proximal humerus ORIF. EXAM: RIGHT HUMERUS - 2+ VIEW; DG C-ARM 1-60 MIN-NO REPORT COMPARISON:  05/12/2024. FLUOROSCOPY: Exposure Index (as provided by the fluoroscopic device): 18.84 mGy Kerma FINDINGS: Five submitted images show placement an intramedullary rod humeral head to the distal metadiaphysis. Rod and associated fixation screws are well seated. Proximal pathologic fracture is well aligned. IMPRESSION: 1. Intraoperative fluoroscopy provided for right humeral ORIF. Electronically Signed   By: Alm Parkins M.D.   On: 05/21/2024 18:56   DG C-Arm 1-60 Min-No Report Result Date: 05/21/2024 Fluoroscopy was utilized by the requesting physician.  No radiographic interpretation.   DG C-Arm 1-60 Min-No Report Result Date: 05/21/2024 Fluoroscopy was utilized by the requesting physician.  No radiographic interpretation.    Labs:  Basic Metabolic Panel: Recent Labs  Lab 06/16/24 0543 06/18/24 0616 06/20/24 0547  NA 137 135 136  K 4.1 4.2 4.2  CL 101 98 99  CO2 28 28 29   GLUCOSE 152* 94 105*  BUN 12 12 15   CREATININE 0.67 0.69 0.69  CALCIUM 8.9 9.3 9.2    CBC: Recent Labs  Lab 06/16/24 0543 06/20/24 0547  WBC 4.9 5.4  HGB 10.2* 10.7*  HCT 33.2* 33.9*  MCV 93.5 92.4  PLT PLATELET CLUMPS NOTED ON SMEAR, UNABLE TO ESTIMATE PLATELET CLUMPS  NOTED ON SMEAR, UNABLE TO ESTIMATE    Brief HPI:   Joseph [name] is a 43 y.o. male  with PMH of asthma, anxiety, depression, with history of polysubstance use disorder  who presented to [facility] on 04/27/2024 due to worsening low back pain, rib pain for several weeks. Due to pain, he endorsed difficulty walking and saddle anesthesia. In ED he was hemodynamically stable, A chest xray showed possible LUL nodule and degenerative changes in the lumbar spine. Right should x-ray showed humeral lucency in the proximal humerus, differential considerations included metastatic disease or multiple myeloma. CT scan showed a large Schmorl's node at the inferior L5 endplate. CT angiogram of chest showed right upper lobe segmental pulmonary embolism with incidental findings of right lower lobe nodules, enlarged and heterogeneous left thyroid  lobe measuring up to 5 cm with right tracheal deviation. Nonemergent thyroid  ultrasound recommended. Heparin  was initiated for pulmonary embolism, patient transferred to [facility] for admission.    Due to difficulty with ambulation and LE sensory changes, neurology was consulted and recommended full spine MRI which showed a pathologic fracture at T2 with several spinal canal stenosis and cord compression L > R with subtle thoracic cord signal change suggesting early cord edema. He was transferred to [facility] on 11/9 for neurosurgery consultation and operative intervention after eliquis  wash out. Patient underwent a posterior T1-3 instrumentation and fusion with T1-3 laminectomy, T2 corpectomy for resection of metastatic tumor by Dr. Darnella on 11/13. 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 Oncology was consulted and reccommended outpatient follow up with ENT, radiation oncology, and endocrinology.    Palliative care consulted for  goals of care and pain management. A prevena suction vac was placed by WOC on 11/24, discontinued on 11/27. He continues on Eliquis . On 11/17 he developed a fever where a workup was negative, WBC trended down. He was admitted to CIR on 11/24 where he was significantly limited by right shoulder pain, initially suspected to be rotator cuff pathology. A MRI completed on 11/29 showed a large tumor with associated pathological fracture of the proximal right humerus. He was admitted back to acute care services. Dr. Dozier performed IMN of R humerus on 11/29. Post op he was WBAT to the RUE. Hospital course complicated by chronic pain with palliative care was consulted for Radiation oncology consulted for palliative radiation to the spine and humerus with plans for 2-week course beginning 12/11 to the spine and 12/18 to the humerus.  He will follow-up with oncology as outpatient for systemic treatment. Palliative care continues to follow of pain/symptom management.    Prior to arrival the patient was independent and driving without use of DME. He currently requires min assist with mobility and basic ADLs.  Therapy evaluations completed due to patient decreased functional mobility was admitted for a comprehensive rehab program.      Inpatient Rehabilitation Course: LOVIE AGRESTA was admitted to rehab 06/02/2024 for inpatient therapies to consist of PT, ST and OT at least three hours five days a week. Past admission physiatrist, therapy team and rehab RN have worked together to provide customized collaborative inpatient rehab.   Pharmaceutical: Eliquis  5mg  BID due to PE 11/10- RUL segmental PE 12/30- will need Eliquis  for 3-6 months due to SCI and  cancer dx             -antiplatelet therapy: N/A   Pain Management:  Palliative care followed and managed pain regimen. Pt will continue current regimen with follow up outpatient at the [facility]. Naloxone  ordered for discharge, instructions provided to  patient and family at bedside.    Mood/Behavior/Sleep: Trazodone  PRN   Skin/Wound Care: Routine pressure relief measures. Monitor incisions.    Fluids/Electrolytes/Nutrition: Monitor I&O and weight. Follow up labs CBC/CMP, continue vitamins/supplements.    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 Completed course of dexamethasone . Patient to follow up outpatient with ENT, endocrinology and radiation oncology.  Follow up PET and possible thyroidectomy- Dr. Faythe.    Pathological right proximal humerus fracture: s/p IMR 11/29. Ortho continued following. Pt underwent updated Xray of R shoulder, showed needle fragments but otherwise is healing.  Continue WBAT with sling.    Hx of thoracic spine fx: Pathological in etiology. Patient underwent instrumentation and fusion on prior admission. Radiation Onc plans 2 weeks of radiotherapy to thoracic spine starts 06/02/24- right humerus on 06/09/24. Radiation completed 12/24.    Hx of RUL segmental  PE: dx on 11/6 provoked secondary to metastatic cancer. Continue Eliquis  for 3-6 months.   Incomplete Paraplegia/Neurogenic bladder: Bladder scan with PVR monitored and did not require In&Out caths. Flomax  0.4 mg q supper to help with emptying.   Chronic normocytic anemia: Stable, continue Niferex 150 mg daily               Constipation/mild neurogenic bowel: Continue Miralax   daily and Senokot S 2 tabs BID  Polysubstance use disorder- will have palliative care f/u with meds after d/c for wean             LE edema B/L: Received Lasix  with caution due to low albumin . BMP monitored.  Advised to keep legs elevated  as much as possible- use ACE wraps, edema is showing some improvement. Protein intake encouraged.   Morbid obesity: educated on diet and weight loss to promote overall health and mobility.   Sore throat/allergies vs.post nasal drip. Continue Flonase  for pt 2 sprays daily.    Planned Outpatient Follow-Up:  -ENT, Dr. Faythe  for outpatient PET CT -Orthopedic surgery  -Palliative care clinic-pain management -PM&R -PCP    Rehab course: During patient's stay in rehab weekly team conferences were held to monitor patient's progress, set goals and discuss barriers to discharge. At admission, patient required min assist with mobility and basic ADLs.    Occupational Therapy: Patient has met ALL long term goals due to improved activity tolerance, improved balance, functional use of right upper extremity and improved coordination.  Patient to discharge at overall modified independent level, with good understanding and adherence to precautions. Patient's care partner is independent to provide the necessary physical assistance at discharge. He will benefit from ongoing OT in outpatient setting to continue to advance functional skills in the area of BADL and iADL.   Physical Therapy: Patient has met ALL long term goals due to improved activity tolerance, improved balance, improved postural control, increased strength, decreased pain, ability to compensate for deficits, improved attention, improved awareness, and improved coordination.  Patient to discharge at an wheelchair level modified independent. He will benefit from ongoing skilled PT services in outpatient setting to continue to advance safe functional mobility, address ongoing impairments in strength, ROM, balance, endurance, gait, and minimize fall risk.   Discharge plan was discussed with patient and parents both at bedside, they verbalized understanding and agreed with it.      Disposition:  Discharge disposition: 01-Home or Self Care        Diet: Regular diet   Special Instructions:  -For lower extremity edema: Apply compression to lower legs and elevate.  -No driving or operating heavy machinery until cleared by provider  -No smoking or alcohol or illicit drug use    Discharge Instructions     Amb Referral to Palliative Care   Complete by: As  directed    Ambulatory referral to Physical Medicine Rehab   Complete by: As directed    Ambulatory referral to Physical Therapy   Complete by: As directed       Allergies as of 06/21/2024   No Known Allergies      Medication List     STOP taking these medications    methocarbamol  750 MG tablet Commonly known as: ROBAXIN    OXYCONTIN  PO Replaced by: Oxycodone  HCl 10 MG Tabs   polyethylene glycol 17 g packet Commonly known as: MIRALAX  / GLYCOLAX  Replaced by: polyethylene glycol powder 17 GM/SCOOP powder       TAKE these medications    Acetaminophen  Extra Strength 500 MG Tabs Take 2 tablets (1,000 mg total) by mouth 3 (three) times daily.   albuterol  (2.5 MG/3ML) 0.083% nebulizer solution Commonly known as: PROVENTIL  Take 3 mLs (2.5 mg total) by nebulization every 6 (six) hours as needed for wheezing or shortness of breath.   CertaVite/Antioxidants Tabs Take 1 tablet by mouth daily.   cyclobenzaprine  5 MG tablet Commonly known as: FLEXERIL  Take 1 tablet (5 mg total) by mouth 3 (three) times daily as needed for muscle spasms. What changed:  medication strength how much to take when to take this reasons to take this   Eliquis  5 MG Tabs tablet Generic drug: apixaban  Take 1 tablet (5 mg total) by mouth 2 (  two) times daily. What changed:  how much to take additional instructions   Ensure Max Protein Liqd Take 11 oz by mouth 2 (two) times daily.   Ferrex 150 150 MG capsule Generic drug: iron  polysaccharides Take 1 capsule (150 mg total) by mouth daily.   fluticasone  50 MCG/ACT nasal spray Commonly known as: FLONASE  Place 2 sprays into both nostrils daily.   furosemide  40 MG tablet Commonly known as: LASIX  Take 1 tablet (40 mg total) by mouth daily.   guaiFENesin  600 MG 12 hr tablet Commonly known as: MUCINEX  Take 2 tablets (1,200 mg total) by mouth 2 (two) times daily.   lidocaine  5 % Commonly known as: LIDODERM  Place 2 patches onto the skin  daily. Remove & Discard patch within 12 hours or as directed by MD   methadone  10 MG tablet Commonly known as: DOLOPHINE  Take 2 tablets (20 mg total) by mouth every 8 (eight) hours for 15 days.   naloxone  0.4 MG/ML injection Commonly known as: NARCAN  Place 1 mL (0.4 mg total) into the nose as needed. TO BE USED FOR OPIOID OVERDOSE   Oxycodone  HCl 10 MG Tabs Take 2 tablets (20 mg total) by mouth every 4 (four) hours as needed for up to 15 days (for breakthrough pain. Max 8 tablets per day.). Replaces: OXYCONTIN  PO   polyethylene glycol powder 17 GM/SCOOP powder Commonly known as: GLYCOLAX /MIRALAX  Dissolve 1 capful (17g) in 4-8 ounces of liquid and take by mouth daily. Replaces: polyethylene glycol 17 g packet   potassium chloride  SA 20 MEQ tablet Commonly known as: KLOR-CON  M Take 1 tablet (20 mEq total) by mouth daily.   pregabalin  50 MG capsule Commonly known as: LYRICA  Take 1 capsule (50 mg total) by mouth 3 (three) times daily.   Stool Softener/Laxative 50-8.6 MG tablet Generic drug: senna-docusate Take 2 tablets by mouth in the morning and at bedtime.   tamsulosin  0.4 MG Caps capsule Commonly known as: FLOMAX  Take 1 capsule (0.4 mg total) by mouth daily after supper.   traZODone  50 MG tablet Commonly known as: DESYREL  Take 1 tablet (50 mg total) by mouth at bedtime as needed for sleep.        Follow-up Information     Lovorn, Megan, MD Follow up.   Specialty: Physical Medicine and Rehabilitation Why: Office will call for appointment Contact information: [address] [place] [address] [phone]         Dozier Soulier, MD. Schedule an appointment as soon as possible for a visit on 06/27/2024.   Specialty: Orthopedic Surgery Contact information: [address] [place] [address] [phone]         Faythe Purchase, MD Follow up.   Specialty: Endocrinology Why: Call for an appointment. Contact information: [address] [place] [address] [phone]         Tina Pauletta BROCKS, MD Follow up.   Specialty: Oncology Why: Call for an appointment. Contact information: [address] [place] [address] [phone]                 Signed: Daphne LOISE Satterfield 06/20/2024, 10:31 AM

## 2024-06-17 NOTE — Progress Notes (Signed)
 Physical Therapy Weekly Progress Note  Patient Details  Name: Joseph [name] MRN: [id number] Date of Birth: 1980-10-09  Beginning of progress report period: June 10, 2024 End of progress report period: June 17, 2024  Today's Date: 06/17/2024 PT Individual Time: 1027-1106, 1405-1500 PT Individual Time Calculation (min): 39 min, 1505   Patient has met 3 of 3 short term goals.  Pt is progressing well. He is ambulating >100 ft when fresh, but become fatigued quickly and so requires w/c follow later in the day and performs shorter distances when fatigued.   Patient continues to demonstrate the following deficits muscle weakness and muscle joint tightness, impaired timing and sequencing, and decreased standing balance, decreased postural control, and difficulty maintaining precautions and therefore will continue to benefit from skilled PT intervention to increase functional independence with mobility.  Patient progressing toward long term goals..  Continue plan of care.  PT Short Term Goals Week 2:  PT Short Term Goal 1 (Week 2): Pt will ambulate 50 ft without need for w/c follow PT Short Term Goal 1 - Progress (Week 2): Met PT Short Term Goal 2 (Week 2): Pt will tolerate x 10 min activity without rest break PT Short Term Goal 2 - Progress (Week 2): Met PT Short Term Goal 3 (Week 2): Pt will perform STS with supervision without UE support consistently Week 3:  PT Short Term Goal 1 (Week 3): =LTGs d/t ELOS  Skilled Therapeutic Interventions/Progress Updates:    Session 1: Pt seated in w/c on arrival and agreeable to therapy. Pt reports pain controlled at this time. Pt assisted with donning shoes for time.   Gait x 120 ft with supervision, w/c in tow for safety. Pt demoes good mechanics and safety. Pt propelled w/c with BLE 3 x 50-80 ft for hamstring strength and endurance. Pt then performed 2 x 10 step ups on 6 step with BUE support on hand rails. Pt fatigued, able to propel w/c  back ~150 ft before becoming fatigued. Pt remained in w/c, was left with all needs in reach and alarm active.   Session 2: Pt seated in w/c on arrival and agreeable to therapy. Pt reports pain controlled. Session focused on family education with pt's mom and aunt. Pt transported to therapy gym for time management and energy conservation.   Pt performed car transfer with supervision, able to maintain precautions. Discussed reclining seat for ease of transfer. Pt ambulated up/down ramp with supervision, no difficulty noted. Pt then ambulated ~120 ft with RW in the same manner as above. Navigated 6 steps x 4 with B hand rails. Discussed safe assist with stairs if needed. Discussed when to use RW vs w/c and pt's assist needs. Answered questions as able, pt and family with no further concerns at end of session. Pt returned to bed with supervision, remained with BLE elevated d/t edema. Pt was left with all needs in reach and alarm active.   Therapy Documentation Precautions:  Precautions Precautions: Fall, Back, Shoulder Type of Shoulder Precautions: WBAT R UE Shoulder Interventions: Shoulder sling/immobilizer Precaution Booklet Issued: Yes (comment) Recall of Precautions/Restrictions: Intact Required Braces or Orthoses: Sling Restrictions Weight Bearing Restrictions Per Provider Order: Yes RUE Weight Bearing Per Provider Order: Weight bearing as tolerated RLE Weight Bearing Per Provider Order: Weight bearing as tolerated LLE Weight Bearing Per Provider Order: Weight bearing as tolerated Other Position/Activity Restrictions: Per Dr. Dozier We will allow weightbearing to tolerance given his lower extremity paraplegia and need for his upper extremities General:  Therapy/Group: Individual Therapy  Joseph [name] 06/17/2024, 4:14 PM

## 2024-06-17 NOTE — Progress Notes (Signed)
 "                                                        PROGRESS NOTE   Subjective/Complaints:  Pt had a reasonable night. No new issues. Pain gradually improving. Ready for more therapy today  ROS: Patient denies fever, rash, sore throat, blurred vision, dizziness, nausea, vomiting, diarrhea, cough, shortness of breath or chest pain,   headache, or mood change.     Objective:   No results found.  Recent Labs    06/16/24 0543  WBC 4.9  HGB 10.2*  HCT 33.2*  PLT PLATELET CLUMPS NOTED ON SMEAR, UNABLE TO ESTIMATE     Recent Labs    06/16/24 0543  NA 137  K 4.1  CL 101  CO2 28  GLUCOSE 152*  BUN 12  CREATININE 0.67  CALCIUM 8.9     Intake/Output Summary (Last 24 hours) at 06/17/2024 0939 Last data filed at 06/17/2024 0600 Gross per 24 hour  Intake 280 ml  Output 1750 ml  Net -1470 ml        Physical Exam: Vital Signs Blood pressure 124/66, pulse 93, temperature 98.5 F (36.9 C), resp. rate 16, height 5' 9 (1.753 m), weight (!) 160.7 kg, SpO2 100%.   Constitutional: No distress . Vital signs reviewed. HEENT: NCAT, EOMI, oral membranes moist Neck: supple Cardiovascular: RRR without murmur. No JVD    Respiratory/Chest: CTA Bilaterally without wheezes or rales. Normal effort    GI/Abdomen: BS +, non-tender, non-distended Ext: no clubbing, cyanosis, 1-2+ limb edema Psych: pleasant and cooperative  Skin: right arm incision closed.  back incision closed Musculoskeletal:     Cervical back: Neck supple.  Neuro:     LUE 5/5 RUE- at least 3/5 proximally; and 5-/5 distally- limited by pain and surgery of proximal humerus RLE- HF 3-/5; KE/KF 4+/5; DF 4/5 and PF 3-/5 LLE- HF 3+/5; KE/KF 5-/5 and DF/PF 5-/5   clonus B/L--no changes No hoffman's MAS of 1 in LE's-  Intact to light touch in all 4 extremities   Assessment/Plan: 1. Functional deficits which require 3+ hours per day of interdisciplinary therapy in a comprehensive inpatient rehab  setting. Physiatrist is providing close team supervision and 24 hour management of active medical problems listed below. Physiatrist and rehab team continue to assess barriers to discharge/monitor patient progress toward functional and medical goals  Care Tool:  Bathing    Body parts bathed by patient: Right arm, Chest, Abdomen, Front perineal area, Right upper leg, Left upper leg, Right lower leg, Left lower leg, Face, Left arm   Body parts bathed by helper: Buttocks     Bathing assist Assist Level: Minimal Assistance - Patient > 75%     Upper Body Dressing/Undressing Upper body dressing   What is the patient wearing?: Pull over shirt    Upper body assist Assist Level: Supervision/Verbal cueing    Lower Body Dressing/Undressing Lower body dressing      What is the patient wearing?: Pants     Lower body assist Assist for lower body dressing: Supervision/Verbal cueing     Toileting Toileting    Toileting assist Assist for toileting: Moderate Assistance - Patient 50 - 74%     Transfers Chair/bed transfer  Transfers assist     Chair/bed transfer assist level: Contact Guard/Touching assist  Locomotion Ambulation   Ambulation assist      Assist level: Contact Guard/Touching assist Assistive device: Walker-rolling Max distance: 29ft   Walk 10 feet activity   Assist     Assist level: Contact Guard/Touching assist Assistive device: Walker-rolling   Walk 50 feet activity   Assist Walk 50 feet with 2 turns activity did not occur: Safety/medical concerns (weakness/fatigue/pain)  Assist level: Contact Guard/Touching assist Assistive device: Walker-rolling    Walk 150 feet activity   Assist Walk 150 feet activity did not occur: Safety/medical concerns (weakness/fatigue/pain)         Walk 10 feet on uneven surface  activity   Assist Walk 10 feet on uneven surfaces activity did not occur: Safety/medical concerns  (weakness/fatigue/pain)         Wheelchair     Assist Is the patient using a wheelchair?: Yes Type of Wheelchair: Manual    Wheelchair assist level: Maximal Assistance - Patient 25 - 49% (B LE and L UE) Max wheelchair distance: 10 ft    Wheelchair 50 feet with 2 turns activity    Assist        Assist Level: Total Assistance - Patient < 25%   Wheelchair 150 feet activity     Assist      Assist Level: Total Assistance - Patient < 25%   Blood pressure 124/66, pulse 93, temperature 98.5 F (36.9 C), resp. rate 16, height 5' 9 (1.753 m), weight (!) 160.7 kg, SpO2 100%.   Medical Problem List and Plan: 1. 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              -patient may  shower             -ELOS/Goals: ~ 12/30- supervision             Admit to CIR Appropriate for CIR- didn't finish first time due to R humeral pathological fx due to Thyroid  cancer mets  Tumor board hopefully meeting soon- haven't heard from Oncology  Continue ACE wraps- written for- educated pt and nursing that needs them for swelling. Needs to keep elevating legs  -Continue CIR therapies including PT, OT   2.  Antithrombotics: -DVT/anticoagulation:  Mechanical: Sequential compression devices, below knee Bilateral lower extremities Pharmaceutical: Eliquis  5mg  BID due to PE 11/10- RUL segmental PE             -antiplatelet therapy: N/A   3. Pain Management: Robaxin  750 TID AC/HS, Lyrica  50 mg TID, Methadone  10 mg TID. Flexeril  10mg  TID, lidoderm  patches. Oxycodone  30 mg q3 hours as needed for breakthrough pain. Palliative care to follow.  12/16- Called Palliative care- will see if can increase Methadone  with goal to reduce Oxycodone  - see if can move Oxy to q4 hours? con't regimen for now, but called and placed consult 12/18- Methadone  up to 12.5 mg q8 hours and Oxycodone  30 mg q3 hours- 5 dose sin last 24 hours-  they also started scheduled Tylenol  and stopped Robaxin - weaning flexeril  12/19- increased methadone  today- on 15 methadone  q8 hours- and Oxy 30 mg q4 hours- to change dosing Monday 12/26 pt reports gradually improving pain, continue current regimen and monitor   4. Mood/Behavior/Sleep: LCSW to follow for evaluation and support when available.              -antipsychotic agents: n/a   -Trazodone  PRN  5. Neuropsych/cognition: This patient is capable of making decisions on his own behalf.  6. Skin/Wound Care: Routine pressure relief measures. Monitor incisions   7. Fluids/Electrolytes/Nutrition: Monitor I&O and weight. Follow up labs CBC/CMP, continue vitamins/supplements.    8. 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 Completed course of dexamethasone . Patient to follow up outpatient with ENT, endocrinology and radiation oncology. ENT plan to discuss at multidisciplinary tumor board on 06/08/24.             - Will likely need Thyroidectomy and RAI-waiting for tumor board 12/16-12/17 should occur at tumor board 12/17 - waiting to hear from Oncology  12/18- waiting for note form Oncology -will reach out about it -06/12/24 see ENT note from 12/20, plan for likely thyroidectomy, outpatient f/up with PET/etc.  F/U Dr. Reyes Alexander after DC  9. Pathological right proximal humerus fracture: s/p IMR 11/29. WBAT in RUE due to needing it- use platform walker.  Ortho follow up in 2 weeks for recheck and updated xrays.  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 Patient underwent instrumentation and fusion on prior admission. Radiation Onc plans 2 weeks of radiotherapy to thoracic spine starts 06/02/24- right humerus on 06/09/24.    Radiation completed 12/24  11. Hx of RUL segmental  PE: dx on 11/6 provoked secondary to metastatic cancer. Continue Eliquis  for  3-6 months.   12. Incomplete Paraplegia/Neurogenic bladder: Monitor bladder scan with PVR  12/12- will order in/out caths and bladder scans q6 hours- to see if needs cathing -12/13-14/25 no cathing overnight, urinating well   12/16- PVR's 4 to 126 cc in last 24 hours 12/17- Will start Flomax - we both agree he's not emptying all the time-  0.4 mg q supper- hopefully will help frequency  12/26 voiding continently with low PVR's 13. Chronic normocytic anemia: Stable, continue Niferex 150 mg daily   -12/25 HGB stable 10.4   14. Constipation/mild neurogenic bowel: Continue Miralax   daily and Senokot S 2 tabs BID -06/12/24 LBM last night, states daily for the last couple days; cont regimen  -12/25 LBM    15. Polysubstance use disorder- will have palliative care f/u with meds after d/c for wean   12/16- called palliative care to help with this  16. LE edema B/L 12/17- will order ACE wraps and d/w pt elevating his legs to level of heart when not in therapy  12/18- d/w nursing-  -06/12/24 pt stating a little more swelling, advised use of ACE wraps, but will give one dose of Lasix  20mg  today-- advised that his albumin  is also low so lasix  alone may not help a ton-- encouraged protein intake.  Filed Weights   06/02/24 1700 06/16/24 0719  Weight: (!) 148.7 kg (!) 160.7 kg     -12/26 has received 3 doses of lasix .    -no lasix  for 2 days. Looks and feels about the same but still appears swollen and tight. Ordered weight this morning   -weight is up quite a bit today compared to last check even assuming there were probably differences in technique   -continue to elevate and wrap. Lyrica  could be contributing   -will begin standing lasix  dose of 40mg  daily for now   -check bmet in am 12/27   LOS: 15 days A FACE TO FACE EVALUATION WAS PERFORMED  Arthea ONEIDA Gunther 06/17/2024, 9:39 AM     "

## 2024-06-17 NOTE — Progress Notes (Signed)
 "                                                                                                                                                        Daily Progress Note   Patient Name: Joseph [name]       Date: 06/17/2024 DOB: 04-02-1981  Age: 43 y.o. MRN#: [id number] Attending Physician: [name], MD Primary Care Physician: Patient, No Pcp Per Admit Date: 06/02/2024  Reason for Consultation/Follow-up: Pain control  Subjective: Medical records reviewed including progress notes, labs, imaging, EKG, MAR.  Patient has received 5 doses of as needed oxycodone  in the past 24 hours. Qtc of 440 today, 444 yesterday. Patient assessed at the bedside.  He reports improvement of his pain overall.  It is worst when he first wakes up in the morning.  He cannot tell much of a difference from either the increase in methadone  or decrease in oxycodone  dosages.  We discussed potential for next titration of methadone  to be 5 mg increase rather than 2.5 mg to improve effectiveness.  We also discussed option to decrease as needed oxycodone  to a dose of 15 mg.  He would be willing to try both of these. Also addressed his question regarding outpatient follow up for management of his cancer pain - he is agreeable to cancer center palliative clinic f/u. No other needs at this time.  Questions and concerns addressed. PMT will continue to support holistically.   Length of Stay: 15   Physical Exam Vitals and nursing note reviewed.  Constitutional:      General: He is not in acute distress. HENT:     Head: Normocephalic and atraumatic.  Cardiovascular:     Rate and Rhythm: Normal rate.  Pulmonary:     Effort: Pulmonary effort is normal. No respiratory distress.  Skin:    General: Skin is warm and dry.  Neurological:     Mental Status: He is alert and oriented to person, place, and time.  Psychiatric:        Mood and Affect: Mood normal.        Behavior: Behavior normal.            Vital Signs:  BP 124/66 (BP Location: Right Arm)   Pulse 93   Temp 98.5 F (36.9 C)   Resp 16   Ht 5' 9 (1.753 m)   Wt (!) 160.7 kg   SpO2 100%   BMI 52.32 kg/m  SpO2: SpO2: 100 % O2 Device: O2 Device: Room Air O2 Flow Rate:    Palliative Care Assessment & Plan   Patient Profile: 43 y.o. male  with past medical history of asthma, anxiety, depression, metastatic papillary thyroid  cancer and history of polysubstance use disorder admitted to CIR on 06/02/2024 with functional deficits secondary to incomplete paraplegia- ASIA D due  to nontraumatic Thyroid  cancer mets/pathological fx.    Patient was admitted 11/5-11/24 for post-op management of posterior T1-3 instrumentation fusion T2 corpectomy for resection of tumor, acute PE, thyroid  biopsy confirming papillary carcinoma, acute blood loss anemia due to procedure. He was then in CIR from 11/24-11/28, though participation in therapy was limited due to an acute fracture/metastasis. Patient was seen by PMT for pain control during readmission 11/29-12/11 for pathological fracture of right humerus. He has been in CIR since then with upcoming tumor board to discuss thyroidectomy.    PMT has been consulted to assist with acute cancer-related pain management.  Assessment: Acute on chronic right shoulder pain due to pathological fracture, improving Metastatic thyroid  cancer  Recommendations/Plan: Continue methadone  20 mg p.o. every 8 hours. EKGs from 12/26 and 12/25 are reassuring Oxycodone  adjusted today - decreased to new dose of 15-20 mg p.o. every 4 hours as needed for breakthrough pain Continue additional pain medications per MAR (tylenol , flexeril , lidocaine , lyrica )  Psychosocial and emotional support provided Placed referral to [facility] palliative clinic today. Previously placed at time of discharge from hospital as well. Will need ongoing management of methadone  prescription PMT will continue to follow and support  intermittently   Prognosis:  Unable to determine  Discharge Planning: Home with Palliative Services         Emnet Monk [name], PA-C  Palliative Medicine Team Team phone # [phone]  Thank you for allowing the Palliative Medicine Team to assist in the care of this patient. Please utilize secure chat with additional questions, if there is no response within 30 minutes please call the above phone number.  Palliative Medicine Team providers are available by phone from 7am to 7pm daily and can be reached through the team cell phone.  Should this patient require assistance outside of these hours, please call the patient's attending physician.    Billing based on MDM: Moderate   "

## 2024-06-17 NOTE — Progress Notes (Signed)
 Occupational Therapy Session Note  Patient Details  Name: Joseph [name] MRN: [id number] Date of Birth: [birth year]  Today's Date: 06/17/2024 OT Individual Time: 9099-9054 & 1300-1400 OT Individual Time Calculation (min): 45 min & 60 min   Short Term Goals: Week 2:  OT Short Term Goal 1 (Week 2): Pt will complete bathing tasks with supervision OT Short Term Goal 2 (Week 2): Pt will complete toileting tasks with min A OT Short Term Goal 3 (Week 2): Pt will complete LB dressing tasks with supervision using AE PRN  Skilled Therapeutic Interventions/Progress Updates:      Therapy Documentation Precautions:  Precautions Precautions: Fall, Back, Shoulder Type of Shoulder Precautions: WBAT R UE Shoulder Interventions: Shoulder sling/immobilizer Precaution Booklet Issued: Yes (comment) Recall of Precautions/Restrictions: Intact Required Braces or Orthoses: Sling Restrictions Weight Bearing Restrictions Per Provider Order: Yes RUE Weight Bearing Per Provider Order: Weight bearing as tolerated RLE Weight Bearing Per Provider Order: Weight bearing as tolerated LLE Weight Bearing Per Provider Order: Weight bearing as tolerated Other Position/Activity Restrictions: Per Dr. Dozier We will allow weightbearing to tolerance given his lower extremity paraplegia and need for his upper extremities Session 1 General:  Pt supine in bed asleep upon OT arrival although easily aroused, agreeable to OT session.  Pain:  7/10 pain reported in Rt shoulder, activity, intermittent rest breaks, distractions provided for pain management, pt reports tolerable to proceed.   ADL: OT providing skilled intervention on ADL retraining in order to increase independence with tasks and increase activity tolerance. Pt completed all ADL tasks at distant supervision with safe technique and no LOB. Pt with occasional brief rest breaks for energy conservation.  Other Treatments: Pt reporting has been able to use LUE  to complete toileting hygiene. OT going to discuss AE for toileting and pt will not require any. OT discussing mod I goals with pt in leu of family eduction in next session. Pt very pleased with progress he has made in rehab so far.    Pt seated in W/C at end of session with call light within reach and 4Ps assessed.    Session 2 (family education) General: Pt seated in W/C upon OT arrival, agreeable to OT.  Pain:  4/10 pain reported in shoulder, activity, intermittent rest breaks, distractions provided for pain management, pt reports tolerable to proceed.   ADL: Patient, pt care partners and Camie OT present at family training. Pt and care partner educated on Rt shoulder mobilization and function, ACE wrapping BLEs for edema and compression devices for edema, functional level of assistance for ADLs and functional mobility/transfers. Pt educated specifically on CARE tool levels of assistance provided at current status and level of assistance for OT goals once at D/C. OT educating family that pt on track to meet mod I goals at D/C. Family and pt very appreciative for information and excited for pt's progress.    Pt seated in W/C at end of session with call light within reach and 4Ps assessed. Family present.    Therapy/Group: Individual Therapy  Camie Hoe, OTD, OTR/L 06/17/2024, 6:45 PM

## 2024-06-18 LAB — BASIC METABOLIC PANEL WITH GFR
Anion gap: 9 (ref 5–15)
BUN: 12 mg/dL (ref 6–20)
CO2: 28 mmol/L (ref 22–32)
Calcium: 9.3 mg/dL (ref 8.9–10.3)
Chloride: 98 mmol/L (ref 98–111)
Creatinine, Ser: 0.69 mg/dL (ref 0.61–1.24)
GFR, Estimated: >60 mL/min
Glucose, Bld: 94 mg/dL (ref 70–99)
Potassium: 4.2 mmol/L (ref 3.5–5.1)
Sodium: 135 mmol/L (ref 135–145)

## 2024-06-18 MED ORDER — PHENOL 1.4 % MT LIQD
1.0000 | OROMUCOSAL | Status: DC | PRN
  Filled 2024-06-18: qty 177

## 2024-06-18 NOTE — Progress Notes (Signed)
 "                                                        PROGRESS NOTE   Subjective/Complaints:  Pt doing well, slept well, pain well managed. LBM 2d ago which is fairly normal for him, urinating fine and low bladder scans so will d/c. Has had a sore throat for a couple days, no other URI symptoms. No other complaints or concerns.   ROS: as per HPI. Denies rhinorrhea, cough, congestion, CP, SOB, abd pain, N/V/D/C, or any other complaints at this time.   +peripheral edema +sore throat   Objective:   No results found.  Recent Labs    06/16/24 0543  WBC 4.9  HGB 10.2*  HCT 33.2*  PLT PLATELET CLUMPS NOTED ON SMEAR, UNABLE TO ESTIMATE     Recent Labs    06/16/24 0543 06/18/24 0616  NA 137 135  K 4.1 4.2  CL 101 98  CO2 28 28  GLUCOSE 152* 94  BUN 12 12  CREATININE 0.67 0.69  CALCIUM 8.9 9.3     Intake/Output Summary (Last 24 hours) at 06/18/2024 1118 Last data filed at 06/18/2024 0739 Gross per 24 hour  Intake 590 ml  Output 1900 ml  Net -1310 ml        Physical Exam: Vital Signs Blood pressure 129/78, pulse 82, temperature 97.7 F (36.5 C), temperature source Oral, resp. rate 18, height 5' 9 (1.753 m), weight (!) 160.7 kg, SpO2 95%.   Constitutional: No distress . Vital signs reviewed. Resting comfortably in bed. HEENT: NCAT, EOMI, oral membranes moist Neck: supple Cardiovascular: RRR without murmur. No JVD    Respiratory/Chest: CTA Bilaterally without wheezes or rales. Normal effort    GI/Abdomen: BS +, non-tender, non-distended Ext: no clubbing, cyanosis, 1-2+ limb edema/anasarca Psych: pleasant and cooperative  Skin: right arm incision closed.  back incision closed  PRIOR EXAMS: Musculoskeletal:     Cervical back: Neck supple.  Neuro:     LUE 5/5 RUE- at least 3/5 proximally; and 5-/5 distally- limited by pain and surgery of proximal humerus RLE- HF 3-/5; KE/KF 4+/5; DF 4/5 and PF 3-/5 LLE- HF 3+/5; KE/KF 5-/5 and DF/PF 5-/5   clonus B/L--no  changes No hoffman's MAS of 1 in LE's-  Intact to light touch in all 4 extremities   Assessment/Plan: 1. Functional deficits which require 3+ hours per day of interdisciplinary therapy in a comprehensive inpatient rehab setting. Physiatrist is providing close team supervision and 24 hour management of active medical problems listed below. Physiatrist and rehab team continue to assess barriers to discharge/monitor patient progress toward functional and medical goals  Care Tool:  Bathing    Body parts bathed by patient: Right arm, Chest, Abdomen, Front perineal area, Right upper leg, Left upper leg, Right lower leg, Left lower leg, Face, Left arm, Buttocks   Body parts bathed by helper: Buttocks     Bathing assist Assist Level: Supervision/Verbal cueing     Upper Body Dressing/Undressing Upper body dressing   What is the patient wearing?: Pull over shirt    Upper body assist Assist Level: Supervision/Verbal cueing    Lower Body Dressing/Undressing Lower body dressing      What is the patient wearing?: Underwear/pull up, Pants     Lower body assist Assist for lower  body dressing: Supervision/Verbal cueing     Toileting Toileting    Toileting assist Assist for toileting: Supervision/Verbal cueing     Transfers Chair/bed transfer  Transfers assist     Chair/bed transfer assist level: Contact Guard/Touching assist     Locomotion Ambulation   Ambulation assist      Assist level: Supervision/Verbal cueing Assistive device: Walker-rolling Max distance: 92   Walk 10 feet activity   Assist     Assist level: Supervision/Verbal cueing Assistive device: Walker-rolling   Walk 50 feet activity   Assist Walk 50 feet with 2 turns activity did not occur: Safety/medical concerns (weakness/fatigue/pain)  Assist level: Supervision/Verbal cueing Assistive device: Walker-rolling    Walk 150 feet activity   Assist Walk 150 feet activity did not occur:  Safety/medical concerns (weakness/fatigue/pain)         Walk 10 feet on uneven surface  activity   Assist Walk 10 feet on uneven surfaces activity did not occur: Safety/medical concerns (weakness/fatigue/pain)         Wheelchair     Assist Is the patient using a wheelchair?: Yes Type of Wheelchair: Manual    Wheelchair assist level: Maximal Assistance - Patient 25 - 49% (B LE and L UE) Max wheelchair distance: 10 ft    Wheelchair 50 feet with 2 turns activity    Assist        Assist Level: Total Assistance - Patient < 25%   Wheelchair 150 feet activity     Assist      Assist Level: Total Assistance - Patient < 25%   Blood pressure 129/78, pulse 82, temperature 97.7 F (36.5 C), temperature source Oral, resp. rate 18, height 5' 9 (1.753 m), weight (!) 160.7 kg, SpO2 95%.   Medical Problem List and Plan: 1. 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              -patient may  shower             -ELOS/Goals: ~ 12/30- supervision             Admit to CIR Appropriate for CIR- didn't finish first time due to R humeral pathological fx due to Thyroid  cancer mets  Tumor board hopefully meeting soon- haven't heard from Oncology Continue ACE wraps- written for- educated pt and nursing that needs them for swelling. Needs to keep elevating legs  -Continue CIR therapies including PT, OT   2.  Antithrombotics: -DVT/anticoagulation:  Mechanical: Sequential compression devices, below knee Bilateral lower extremities Pharmaceutical: Eliquis  5mg  BID due to PE 11/10- RUL segmental PE             -antiplatelet therapy: N/A   3. Pain Management: Robaxin  750 TID AC/HS, Lyrica  50 mg TID, Methadone  10 mg TID. Flexeril  10mg  TID, lidoderm  patches. Oxycodone  30 mg q3 hours as needed for breakthrough pain. Palliative care to follow.  12/16- Called Palliative care- will see if can increase Methadone   with goal to reduce Oxycodone  - see if can move Oxy to q4 hours? con't regimen for now, but called and placed consult 12/18- Methadone  up to 12.5 mg q8 hours and Oxycodone  30 mg q3 hours- 5 dose sin last 24 hours- they also started scheduled Tylenol  and stopped Robaxin - weaning flexeril  12/19- increased methadone  today- on 15 methadone  q8 hours- and Oxy 30 mg q4 hours- to change dosing Monday 12/26 pt reports gradually improving pain, continue current regimen and monitor   4.  Mood/Behavior/Sleep: LCSW to follow for evaluation and support when available.              -antipsychotic agents: n/a   -Trazodone  PRN  5. Neuropsych/cognition: This patient is capable of making decisions on his own behalf.   6. Skin/Wound Care: Routine pressure relief measures. Monitor incisions   7. Fluids/Electrolytes/Nutrition: Monitor I&O and weight. Follow up labs CBC/CMP, continue vitamins/supplements.    8. 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 Completed course of dexamethasone . Patient to follow up outpatient with ENT, endocrinology and radiation oncology. ENT plan to discuss at multidisciplinary tumor board on 06/08/24.             - Will likely need Thyroidectomy and RAI-waiting for tumor board 12/16-12/17 should occur at tumor board 12/17 - waiting to hear from Oncology  12/18- waiting for note form Oncology -will reach out about it -06/12/24 see ENT note from 12/20, plan for likely thyroidectomy, outpatient f/up with PET/etc.  F/U Dr. Reyes Alexander after DC  9. Pathological right proximal humerus fracture: s/p IMR 11/29. WBAT in RUE due to needing it- use platform walker.  Ortho follow up in 2 weeks for recheck and updated xrays.  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 Patient underwent instrumentation and fusion on prior admission. Radiation Onc plans 2  weeks of radiotherapy to thoracic spine starts 06/02/24- right humerus on 06/09/24.    Radiation completed 12/24  11. Hx of RUL segmental  PE: dx on 11/6 provoked secondary to metastatic cancer. Continue Eliquis  for 3-6 months.   12. Incomplete Paraplegia/Neurogenic bladder: Monitor bladder scan with PVR  12/12- will order in/out caths and bladder scans q6 hours- to see if needs cathing -12/13-14/25 no cathing overnight, urinating well   12/16- PVR's 4 to 126 cc in last 24 hours 12/17- Will start Flomax - we both agree he's not emptying all the time-  0.4 mg q supper- hopefully will help frequency  12/26 voiding continently with low PVR's  -06/18/24 still low PVRs, will d/c now, voiding well  13. Chronic normocytic anemia: Stable, continue Niferex 150 mg daily   -12/25 HGB stable 10.4   14. Constipation/mild neurogenic bowel: Continue Miralax   daily and Senokot S 2 tabs BID -06/12/24 LBM last night, states daily for the last couple days; cont regimen  -12/27 LBM  2d ago, if no BM by tomorrow will do MoM 15ml  15. Polysubstance use disorder- will have palliative care f/u with meds after d/c for wean   12/16- called palliative care to help with this  16. LE edema B/L 12/17- will order ACE wraps and d/w pt elevating his legs to level of heart when not in therapy -06/12/24 pt stating a little more swelling, advised use of ACE wraps, but will give one dose of Lasix  20mg  today-- advised that his albumin  is also low so lasix  alone may not help a ton-- encouraged protein intake.  Filed Weights   06/02/24 1700 06/16/24 0719  Weight: (!) 148.7 kg (!) 160.7 kg     -12/26 has received 3 doses of lasix .  -no lasix  for 2 days. Looks and feels about the same but still appears swollen and tight. -weight is up quite a bit    -continue to elevate and wrap. Lyrica  could be contributing   -will begin standing lasix  dose of 40mg  daily for now   -check bmet  in am 12/27 -06/18/24 BMP fine, cont lasix ,  but might really benefit from albumin -- at this point basically anasarca.    LOS: 16 days A FACE TO FACE EVALUATION WAS PERFORMED  [address] 06/18/2024, 11:18 AM     "

## 2024-06-18 NOTE — Plan of Care (Signed)
°  Problem: Consults Goal: RH SPINAL CORD INJURY PATIENT EDUCATION Description:  See Patient Education module for education specifics.  Outcome: Progressing   Problem: SCI BOWEL ELIMINATION Goal: RH STG MANAGE BOWEL WITH ASSISTANCE Description: STG Manage Bowel with supervision Assistance. Outcome: Progressing   Problem: SCI BLADDER ELIMINATION Goal: RH STG MANAGE BLADDER WITH ASSISTANCE Description: STG Manage Bladder With supervision Assistance Outcome: Progressing   Problem: RH SKIN INTEGRITY Goal: RH STG SKIN FREE OF INFECTION/BREAKDOWN Description: Manage skin free from infection with supervision assistance Outcome: Progressing   Problem: RH SAFETY Goal: RH STG ADHERE TO SAFETY PRECAUTIONS W/ASSISTANCE/DEVICE Description: STG Adhere to Safety Precautions With supervision Assistance/Device. Outcome: Progressing   Problem: RH PAIN MANAGEMENT Goal: RH STG PAIN MANAGED AT OR BELOW PT'S PAIN GOAL Description: <4 w/ prns Outcome: Progressing   Problem: RH KNOWLEDGE DEFICIT SCI Goal: RH STG INCREASE KNOWLEDGE OF SELF CARE AFTER SCI Outcome: Progressing

## 2024-06-18 NOTE — Progress Notes (Signed)
 Occupational Therapy Session Note  Patient Details  Name: Joseph [name] MRN: [id number] Date of Birth: 08/25/1980  Today's Date: 06/18/2024 OT Individual Time: 9299-9188 OT Individual Time Calculation (min): 71 min    Short Term Goals: Week 2:  OT Short Term Goal 1 (Week 2): Pt will complete bathing tasks with supervision OT Short Term Goal 2 (Week 2): Pt will complete toileting tasks with min A OT Short Term Goal 3 (Week 2): Pt will complete LB dressing tasks with supervision using AE PRN  Skilled Therapeutic Interventions/Progress Updates:    Skilled OT intervention with focus on bed mobility, sit<>stand, standing balance, functional amb with RW, bathing and dressing with sit<>stand, BUE therex/AROM for joint mobility and general conditioning to increase independence with bADLs. All bed mobility with supervision. Sit<>stand and functional amb with RW at supervision level. Pt completed bathing tasks with min A for buttocks. Dependent for donning socks without AE. All other dressing tasks with supervision. Rest breaks throughout session as appropriate. BUE gentle therex 2x5 min UBE level 2 for RUE joint mobility and conditioning. Pt returned to room and requested to return to bed. Sit>supine with supervision. Pt remained in bed with all needs within reach. Bed alarm activated.   Therapy Documentation Precautions:  Precautions Precautions: Fall, Back, Shoulder Type of Shoulder Precautions: WBAT R UE Shoulder Interventions: Shoulder sling/immobilizer Precaution Booklet Issued: Yes (comment) Recall of Precautions/Restrictions: Intact Required Braces or Orthoses: Sling Restrictions Weight Bearing Restrictions Per Provider Order: Yes RUE Weight Bearing Per Provider Order: Weight bearing as tolerated RLE Weight Bearing Per Provider Order: Weight bearing as tolerated LLE Weight Bearing Per Provider Order: Weight bearing as tolerated Other Position/Activity Restrictions: Per Dr. Dozier  We will allow weightbearing to tolerance given his lower extremity paraplegia and need for his upper extremities   Pain: Pt c/o 8/10 Rt shoulder pain; meds admin during session, AROM/stertching, and rest as appropriate. Pt also c/o sore throar; RN notified   Therapy/Group: Individual Therapy  [name] 06/18/2024, 8:14 AM

## 2024-06-18 NOTE — Progress Notes (Signed)
 Physical Therapy Session Note  Patient Details  Name: Joseph [name] MRN: [id number] Date of Birth: [birth year]  Today's Date: 06/18/2024 PT Individual Time: 1015-1115 and 1300-1400 PT Individual Time Calculation (min): 60 min and 60 min.  Short Term Goals: Week 2:  PT Short Term Goal 1 (Week 2): Pt will ambulate 50 ft without need for w/c follow PT Short Term Goal 1 - Progress (Week 2): Met PT Short Term Goal 2 (Week 2): Pt will tolerate x 10 min activity without rest break PT Short Term Goal 2 - Progress (Week 2): Met PT Short Term Goal 3 (Week 2): Pt will perform STS with supervision without UE support consistently  Skilled Therapeutic Interventions/Progress Updates:   First session:  Pt presents supine in bed co pain and agreeable to pain meds in able.  Nursing to check status.  PT returned in 5' and pt agreeable to therapy.  Pt transfers sup to sit w/ modified log roll and supervision.  Pt transfers sit to stand w/ supervision.  Pt amb to w/c w/ RW and supervision.  Pt wheeled to dayroom.  Pt states need for BR.  Pt amb to BR and continent of bladder in toilet, charted in Flowsheets.  Pt performed standing heel raises, marching 3 x 10 w/ seated rest breaks required.  Pt amb multiple trials w/ RW and supervision up to 92'.  Pt returned to room and transferred sit to supine w/ supervision.  LES elevated on pillows and all needs in reach, family present.  Missed time of 15' 2/2 pain.  Second session:  Pt presents supine in bed w/ LES elevated, pt agreeable to therapy.  Pt transfers sup to sit w/ supervision.  Emptied 500 ml from urinal.  Pt transfers and amb to BR for another continent void, both charted in Flowsheets.  Pt wheeled to outdoors for change of scenery and initiating gait on uneven surfaces.  Pt amb x 60' w/ CGA/C/S on uneven surfaces to bench.  Pt amb to w/c.  Pt amb around breezeway to bench.  Pt transfers sit to stand w/ supervision using LUE only 2/2 soreness R shoulder.  Pt  returned to main gym.  Pt negotiated (8) 3 steps w/ B rails w/ min/CGA, reciprocal gait pattern.  Pt amb into BR for continent void in toilet w/ supervision, charted in Flowsheets.  Pt returned to bed w/ supervision.  Bed alarm on and all needs in reach, LES elevated on pillows.  Cousin visiting at conclusion of session.     Therapy Documentation Precautions:  Precautions Precautions: Fall, Back, Shoulder Type of Shoulder Precautions: WBAT R UE Shoulder Interventions: Shoulder sling/immobilizer Precaution Booklet Issued: Yes (comment) Recall of Precautions/Restrictions: Intact Required Braces or Orthoses: Sling Restrictions Weight Bearing Restrictions Per Provider Order: Yes RUE Weight Bearing Per Provider Order: Weight bearing as tolerated RLE Weight Bearing Per Provider Order: Weight bearing as tolerated LLE Weight Bearing Per Provider Order: Weight bearing as tolerated Other Position/Activity Restrictions: Per Dr. Dozier We will allow weightbearing to tolerance given his lower extremity paraplegia and need for his upper extremities General: PT Amount of Missed Time (min): 15 Minutes PT Missed Treatment Reason: Pain Vital Signs:   Pain:5/10, 5/10 in PM session. Pain Assessment Pain Scale: 0-10 Pain Score: 8  Pain Location: Shoulder Pain Intervention(s): Medication (See eMAR);Repositioned    Therapy/Group: Individual Therapy  Joseph [name] 06/18/2024, 11:17 AM

## 2024-06-19 NOTE — Progress Notes (Signed)
 "                                                        PROGRESS NOTE   Subjective/Complaints:  Pt doing well again, slept ok, pain well managed. LBM yesterday per pt but not documented, urinating fine. No other complaints or concerns.   ROS: as per HPI. Denies rhinorrhea, cough, congestion, CP, SOB, abd pain, N/V/D/C, or any other complaints at this time.   +peripheral edema-improving +sore throat   Objective:   No results found.  No results for input(s): WBC, HGB, HCT, PLT in the last 72 hours.    Recent Labs    06/18/24 0616  NA 135  K 4.2  CL 98  CO2 28  GLUCOSE 94  BUN 12  CREATININE 0.69  CALCIUM 9.3     Intake/Output Summary (Last 24 hours) at 06/19/2024 1129 Last data filed at 06/19/2024 1001 Gross per 24 hour  Intake 474 ml  Output 1250 ml  Net -776 ml        Physical Exam: Vital Signs Blood pressure 137/67, pulse 85, temperature 98 F (36.7 C), resp. rate 16, height 5' 9 (1.753 m), weight (!) 160.7 kg, SpO2 100%.   Constitutional: No distress . Vital signs reviewed. In w/c finishing with PT HEENT: NCAT, EOMI, oral membranes moist Neck: supple Cardiovascular: RRR without murmur. No JVD    Respiratory/Chest: CTA Bilaterally without wheezes or rales. Normal effort    GI/Abdomen: BS +, non-tender, non-distended Ext: no clubbing, cyanosis, 1-2+ limb edema/anasarca Psych: pleasant and cooperative  Skin: right arm incision closed.  back incision closed  PRIOR EXAMS: Musculoskeletal:     Cervical back: Neck supple.  Neuro:     LUE 5/5 RUE- at least 3/5 proximally; and 5-/5 distally- limited by pain and surgery of proximal humerus RLE- HF 3-/5; KE/KF 4+/5; DF 4/5 and PF 3-/5 LLE- HF 3+/5; KE/KF 5-/5 and DF/PF 5-/5   clonus B/L--no changes No hoffman's MAS of 1 in LE's-  Intact to light touch in all 4 extremities   Assessment/Plan: 1. Functional deficits which require 3+ hours per day of interdisciplinary therapy in a comprehensive  inpatient rehab setting. Physiatrist is providing close team supervision and 24 hour management of active medical problems listed below. Physiatrist and rehab team continue to assess barriers to discharge/monitor patient progress toward functional and medical goals  Care Tool:  Bathing    Body parts bathed by patient: Right arm, Chest, Abdomen, Front perineal area, Right upper leg, Left upper leg, Right lower leg, Left lower leg, Face, Left arm, Buttocks   Body parts bathed by helper: Buttocks     Bathing assist Assist Level: Supervision/Verbal cueing     Upper Body Dressing/Undressing Upper body dressing   What is the patient wearing?: Pull over shirt    Upper body assist Assist Level: Supervision/Verbal cueing    Lower Body Dressing/Undressing Lower body dressing      What is the patient wearing?: Underwear/pull up, Pants     Lower body assist Assist for lower body dressing: Supervision/Verbal cueing     Toileting Toileting    Toileting assist Assist for toileting: Supervision/Verbal cueing     Transfers Chair/bed transfer  Transfers assist     Chair/bed transfer assist level: Contact Guard/Touching assist     Locomotion Ambulation  Ambulation assist      Assist level: Supervision/Verbal cueing Assistive device: Walker-rolling Max distance: 92   Walk 10 feet activity   Assist     Assist level: Supervision/Verbal cueing Assistive device: Walker-rolling   Walk 50 feet activity   Assist Walk 50 feet with 2 turns activity did not occur: Safety/medical concerns (weakness/fatigue/pain)  Assist level: Supervision/Verbal cueing Assistive device: Walker-rolling    Walk 150 feet activity   Assist Walk 150 feet activity did not occur: Safety/medical concerns (weakness/fatigue/pain)         Walk 10 feet on uneven surface  activity   Assist Walk 10 feet on uneven surfaces activity did not occur: Safety/medical concerns  (weakness/fatigue/pain)   Assist level: Contact Guard/Touching assist (outdoor surfaces.) Assistive device: Walker-rolling   Wheelchair     Assist Is the patient using a wheelchair?: Yes Type of Wheelchair: Manual    Wheelchair assist level: Maximal Assistance - Patient 25 - 49% (B LE and L UE) Max wheelchair distance: 10 ft    Wheelchair 50 feet with 2 turns activity    Assist        Assist Level: Total Assistance - Patient < 25%   Wheelchair 150 feet activity     Assist      Assist Level: Total Assistance - Patient < 25%   Blood pressure 137/67, pulse 85, temperature 98 F (36.7 C), resp. rate 16, height 5' 9 (1.753 m), weight (!) 160.7 kg, SpO2 100%.   Medical Problem List and Plan: 1. 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              -patient may  shower             -ELOS/Goals: ~ 12/30- supervision             Admit to CIR Appropriate for CIR- didn't finish first time due to R humeral pathological fx due to Thyroid  cancer mets  Tumor board hopefully meeting soon- haven't heard from Oncology Continue ACE wraps- written for- educated pt and nursing that needs them for swelling. Needs to keep elevating legs  -Continue CIR therapies including PT, OT   2.  Antithrombotics: -DVT/anticoagulation:  Mechanical: Sequential compression devices, below knee Bilateral lower extremities Pharmaceutical: Eliquis  5mg  BID due to PE 11/10- RUL segmental PE             -antiplatelet therapy: N/A   3. Pain Management: Robaxin  750 TID AC/HS, Lyrica  50 mg TID, Methadone  10 mg TID. Flexeril  10mg  TID, lidoderm  patches. Oxycodone  30 mg q3 hours as needed for breakthrough pain. Palliative care to follow.  12/16- Called Palliative care- will see if can increase Methadone  with goal to reduce Oxycodone  - see if can move Oxy to q4 hours? con't regimen for now, but called and placed consult 12/18-  Methadone  up to 12.5 mg q8 hours and Oxycodone  30 mg q3 hours- 5 dose sin last 24 hours- they also started scheduled Tylenol  and stopped Robaxin - weaning flexeril  12/19- increased methadone  today- on 15 methadone  q8 hours- and Oxy 30 mg q4 hours- to change dosing Monday 12/26 pt reports gradually improving pain, continue current regimen and monitor   4. Mood/Behavior/Sleep: LCSW to follow for evaluation and support when available.              -antipsychotic agents: n/a   -Trazodone  PRN  5. Neuropsych/cognition: This patient is capable of making decisions on his own behalf.  6. Skin/Wound Care: Routine pressure relief measures. Monitor incisions   7. Fluids/Electrolytes/Nutrition: Monitor I&O and weight. Follow up labs CBC/CMP, continue vitamins/supplements.    8. 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 Completed course of dexamethasone . Patient to follow up outpatient with ENT, endocrinology and radiation oncology. ENT plan to discuss at multidisciplinary tumor board on 06/08/24.             - Will likely need Thyroidectomy and RAI-waiting for tumor board 12/16-12/17 should occur at tumor board 12/17 - waiting to hear from Oncology  12/18- waiting for note form Oncology -will reach out about it -06/12/24 see ENT note from 12/20, plan for likely thyroidectomy, outpatient f/up with PET/etc.  F/U Dr. Reyes Alexander after DC  9. Pathological right proximal humerus fracture: s/p IMR 11/29. WBAT in RUE due to needing it- use platform walker.  Ortho follow up in 2 weeks for recheck and updated xrays.  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 Patient underwent instrumentation and fusion on prior admission. Radiation Onc plans 2 weeks of radiotherapy to thoracic spine starts 06/02/24- right humerus on 06/09/24.    Radiation completed 12/24  11. Hx of RUL  segmental  PE: dx on 11/6 provoked secondary to metastatic cancer. Continue Eliquis  for 3-6 months.   12. Incomplete Paraplegia/Neurogenic bladder: Monitor bladder scan with PVR  12/12- will order in/out caths and bladder scans q6 hours- to see if needs cathing -12/13-14/25 no cathing overnight, urinating well   12/16- PVR's 4 to 126 cc in last 24 hours 12/17- Will start Flomax - we both agree he's not emptying all the time-  0.4 mg q supper- hopefully will help frequency  12/26 voiding continently with low PVR's  -06/18/24 still low PVRs, will d/c now, voiding well  13. Chronic normocytic anemia: Stable, continue Niferex 150 mg daily   -12/25 HGB stable 10.4   14. Constipation/mild neurogenic bowel: Continue Miralax   daily and Senokot S 2 tabs BID -12/27 LBM  2d ago, if no BM by tomorrow will do MoM 15ml-- had BM afternoon 12/27, no MoM needed  15. Polysubstance use disorder- will have palliative care f/u with meds after d/c for wean   12/16- called palliative care to help with this  16. LE edema B/L 12/17- will order ACE wraps and d/w pt elevating his legs to level of heart when not in therapy -06/12/24 pt stating a little more swelling, advised use of ACE wraps, but will give one dose of Lasix  20mg  today-- advised that his albumin  is also low so lasix  alone may not help a ton-- encouraged protein intake.  Filed Weights   06/02/24 1700 06/16/24 0719  Weight: (!) 148.7 kg (!) 160.7 kg     -12/26 has received 3 doses of lasix .  -no lasix  for 2 days. Looks and feels about the same but still appears swollen and tight. -weight is up quite a bit    -continue to elevate and wrap. Lyrica  could be contributing   -will begin standing lasix  dose of 40mg  daily for now   -check bmet in am 12/27 -06/18/24 BMP fine, cont lasix , but might really benefit from albumin -- at this point basically anasarca-- a little better 12/28   LOS: 17 days A FACE TO [facility] EVALUATION WAS PERFORMED  [address] 06/19/2024, 11:29 AM     "

## 2024-06-19 NOTE — Progress Notes (Signed)
 Physical Therapy Session Note  Patient Details  Name: Joseph [name] MRN: [id number] Date of Birth: [birth year]  Today's Date: 06/19/2024 PT Individual Time: 1300-1413 PT Individual Time Calculation (min): 73 min   Short Term Goals: Week 3:  PT Short Term Goal 1 (Week 3): =LTGs d/t ELOS  Skilled Therapeutic Interventions/Progress Updates:      Pt supine in bed upon arrival. Pt agreeable to therapy. Pt denies any pain.   Pt ambulated to bathroom x2 throughout session with RW and supervision. Pt continent of bladder while standing x2, verbal cues provided for positioning of RW. Pt stood with RW and washed hands with supervision.   Pt requires seated rest brek for fatigue.   Pt ambulated 143 feet with RW and supervision.   Pt ambulated 2x10, 1x15 feet  and performed ambulatory tranfser to mat table and WC with no AD and CGA/light min A, with wide BOS and decreased step length B however improving with repetition.   Pt stood with no AD with feet as close as they could go together x~ 1 min, semi tandem x ~30 seconds pt needed HHA to obtain position but able to sustain with CGA.   Pt seated in [facility] with all needs within reach and chair alarm on.   Therapy Documentation Precautions:  Precautions Precautions: Fall, Back, Shoulder Type of Shoulder Precautions: WBAT R UE Shoulder Interventions: Shoulder sling/immobilizer Precaution Booklet Issued: Yes (comment) Recall of Precautions/Restrictions: Intact Required Braces or Orthoses: Sling Restrictions Weight Bearing Restrictions Per Provider Order: Yes RUE Weight Bearing Per Provider Order: Weight bearing as tolerated RLE Weight Bearing Per Provider Order: Weight bearing as tolerated LLE Weight Bearing Per Provider Order: Weight bearing as tolerated Other Position/Activity Restrictions: Per Dr. Dozier We will allow weightbearing to tolerance given his lower extremity paraplegia and need for his upper extremities  Therapy/Group:  Individual Therapy  [facility] [place], [place], DPT  06/19/2024, 1:45 PM

## 2024-06-19 NOTE — Plan of Care (Signed)
°  Problem: Consults Goal: RH SPINAL CORD INJURY PATIENT EDUCATION Description:  See Patient Education module for education specifics.  Outcome: Progressing   Problem: SCI BOWEL ELIMINATION Goal: RH STG MANAGE BOWEL WITH ASSISTANCE Description: STG Manage Bowel with supervision Assistance. Outcome: Progressing   Problem: SCI BLADDER ELIMINATION Goal: RH STG MANAGE BLADDER WITH ASSISTANCE Description: STG Manage Bladder With supervision Assistance Outcome: Progressing   Problem: RH SKIN INTEGRITY Goal: RH STG SKIN FREE OF INFECTION/BREAKDOWN Description: Manage skin free from infection with supervision assistance Outcome: Progressing   Problem: RH SAFETY Goal: RH STG ADHERE TO SAFETY PRECAUTIONS W/ASSISTANCE/DEVICE Description: STG Adhere to Safety Precautions With supervision Assistance/Device. Outcome: Progressing   Problem: RH PAIN MANAGEMENT Goal: RH STG PAIN MANAGED AT OR BELOW PT'S PAIN GOAL Description: <4 w/ prns Outcome: Progressing   Problem: RH KNOWLEDGE DEFICIT SCI Goal: RH STG INCREASE KNOWLEDGE OF SELF CARE AFTER SCI Outcome: Progressing

## 2024-06-19 NOTE — Progress Notes (Signed)
 Physical Therapy Session Note  Patient Details  Name: Joseph [name] MRN: [id number] Date of Birth: 1981/02/21  Today's Date: 06/22/2024 PT Individual Time: 0805-0850 PT Individual Time Calculation (min): 45 min   Short Term Goals: Week 3:  PT Short Term Goal 1 (Week 3): =LTGs d/t ELOS  Skilled Therapeutic Interventions/Progress Updates: Pt presented in bed agreeable to therapy. Pt c/o pain RUE, recently received pain meds. Pt completed bed mobility with supervision and donned shorts/shirt with set up. Pt stood with supervision and RW and pulled shorts over hips with supervision. Pt then transitioned to w/c and compelted oral hygiene at sink mod I. Pt transported to main gym and trialed ambulation without AD in parallel bars. Pt able to ambulate ~62ft in bars with x 3 turns and CGA. Discussed that over time may be able to wean from AD however use of RW is also for energy conservation. Pt voiced understanding. Pt indicated need for bathroom, transported in w/c to bathroom by elvators for time management. Completed ambulatory transfer to toilet with RW and distant supervision. Completed hand hygiene standing at sink with distant supervision. Pt then ambulated ~91ft with RW and supervision to day room. Pt with increased fatigue requiring extended seated rest. Pt then ambulated partial distance to room ~43ft then transported remaining distance back to room. Completed ambulatory transfer to bed with RW and supervision and completed sit to supine mod I. Pt left resting in bed at end of session with call bell within reach and needs met.      Therapy Documentation Precautions:  Precautions Precautions: Fall, Back, Shoulder Type of Shoulder Precautions: WBAT R UE Shoulder Interventions: Shoulder sling/immobilizer Precaution Booklet Issued: Yes (comment) Recall of Precautions/Restrictions: Intact Required Braces or Orthoses: Sling Restrictions Weight Bearing Restrictions Per Provider Order:  Yes RUE Weight Bearing Per Provider Order: Weight bearing as tolerated RLE Weight Bearing Per Provider Order: Weight bearing as tolerated LLE Weight Bearing Per Provider Order: Weight bearing as tolerated Other Position/Activity Restrictions: Per Dr. Dozier We will allow weightbearing to tolerance given his lower extremity paraplegia and need for his upper extremities   Therapy/Group: Individual Therapy  [name] 06/22/2024, 12:32 PM   Addended due to incorrect time entry 12/31

## 2024-06-20 ENCOUNTER — Ambulatory Visit: Payer: MEDICAID

## 2024-06-20 ENCOUNTER — Other Ambulatory Visit (HOSPITAL_COMMUNITY): Payer: Self-pay

## 2024-06-20 LAB — CBC
HCT: 33.9 % — ABNORMAL LOW (ref 39.0–52.0)
Hemoglobin: 10.7 g/dL — ABNORMAL LOW (ref 13.0–17.0)
MCH: 29.2 pg (ref 26.0–34.0)
MCHC: 31.6 g/dL (ref 30.0–36.0)
MCV: 92.4 fL (ref 80.0–100.0)
Platelets: UNDETERMINED K/uL (ref 150–400)
RBC: 3.67 MIL/uL — ABNORMAL LOW (ref 4.22–5.81)
RDW: 14.6 % (ref 11.5–15.5)
WBC: 5.4 K/uL (ref 4.0–10.5)
nRBC: 0 % (ref 0.0–0.2)

## 2024-06-20 LAB — BASIC METABOLIC PANEL WITH GFR
Anion gap: 7 (ref 5–15)
BUN: 15 mg/dL (ref 6–20)
CO2: 29 mmol/L (ref 22–32)
Calcium: 9.2 mg/dL (ref 8.9–10.3)
Chloride: 99 mmol/L (ref 98–111)
Creatinine, Ser: 0.69 mg/dL (ref 0.61–1.24)
GFR, Estimated: >60 mL/min
Glucose, Bld: 105 mg/dL — ABNORMAL HIGH (ref 70–99)
Potassium: 4.2 mmol/L (ref 3.5–5.1)
Sodium: 136 mmol/L (ref 135–145)

## 2024-06-20 MED ORDER — POLYETHYLENE GLYCOL 3350 17 GM/SCOOP PO POWD
17.0000 g | Freq: Every day | ORAL | 0 refills | Status: DC
  Filled 2024-06-20: qty 238, 14d supply, fill #0

## 2024-06-20 MED ORDER — OXYCODONE HCL 20 MG PO TABS
20.0000 mg | ORAL_TABLET | ORAL | 0 refills | Status: DC | PRN
  Filled 2024-06-20: qty 60, 15d supply, fill #0

## 2024-06-20 MED ORDER — APIXABAN 5 MG PO TABS
5.0000 mg | ORAL_TABLET | Freq: Two times a day (BID) | ORAL | 0 refills | Status: DC
  Filled 2024-06-20: qty 60, 30d supply, fill #0

## 2024-06-20 MED ORDER — FUROSEMIDE 40 MG PO TABS
40.0000 mg | ORAL_TABLET | Freq: Every day | ORAL | 0 refills | Status: DC
  Filled 2024-06-20: qty 30, 30d supply, fill #0

## 2024-06-20 MED ORDER — LIDOCAINE 5 % EX PTCH
2.0000 | MEDICATED_PATCH | Freq: Every day | CUTANEOUS | 0 refills | Status: DC
  Filled 2024-06-20: qty 30, 15d supply, fill #0

## 2024-06-20 MED ORDER — CYCLOBENZAPRINE HCL 5 MG PO TABS
5.0000 mg | ORAL_TABLET | Freq: Three times a day (TID) | ORAL | 0 refills | Status: DC | PRN
  Filled 2024-06-20: qty 30, 10d supply, fill #0

## 2024-06-20 MED ORDER — POLYSACCHARIDE IRON COMPLEX 150 MG PO CAPS
150.0000 mg | ORAL_CAPSULE | Freq: Every day | ORAL | 0 refills | Status: DC
  Filled 2024-06-20: qty 30, 30d supply, fill #0

## 2024-06-20 MED ORDER — SENNA-DOCUSATE SODIUM 8.6-50 MG PO TABS
2.0000 | ORAL_TABLET | Freq: Two times a day (BID) | ORAL | 0 refills | Status: DC
  Filled 2024-06-20: qty 120, 30d supply, fill #0

## 2024-06-20 MED ORDER — ALBUTEROL SULFATE (2.5 MG/3ML) 0.083% IN NEBU
2.5000 mg | INHALATION_SOLUTION | Freq: Four times a day (QID) | RESPIRATORY_TRACT | 12 refills | Status: AC | PRN
  Filled 2024-06-20: qty 90, 8d supply, fill #0

## 2024-06-20 MED ORDER — ADULT MULTIVITAMIN W/MINERALS CH
1.0000 | ORAL_TABLET | Freq: Every day | ORAL | 0 refills | Status: AC
  Filled 2024-06-20: qty 100, 100d supply, fill #0

## 2024-06-20 MED ORDER — GUAIFENESIN ER 600 MG PO TB12: 1200.0000 mg | ORAL_TABLET | Freq: Two times a day (BID) | ORAL | Status: AC

## 2024-06-20 MED ORDER — PREGABALIN 50 MG PO CAPS
50.0000 mg | ORAL_CAPSULE | Freq: Three times a day (TID) | ORAL | 0 refills | Status: DC
  Filled 2024-06-20: qty 90, 30d supply, fill #0

## 2024-06-20 MED ORDER — TRAZODONE HCL 50 MG PO TABS
50.0000 mg | ORAL_TABLET | Freq: Every evening | ORAL | 0 refills | Status: DC | PRN
  Filled 2024-06-20: qty 30, 30d supply, fill #0

## 2024-06-20 MED ORDER — POTASSIUM CHLORIDE CRYS ER 20 MEQ PO TBCR
20.0000 meq | EXTENDED_RELEASE_TABLET | Freq: Every day | ORAL | 0 refills | Status: DC
  Filled 2024-06-20: qty 30, 30d supply, fill #0

## 2024-06-20 MED ORDER — TAMSULOSIN HCL 0.4 MG PO CAPS
0.4000 mg | ORAL_CAPSULE | Freq: Every day | ORAL | 0 refills | Status: DC
  Filled 2024-06-20: qty 30, 30d supply, fill #0

## 2024-06-20 MED ORDER — METHADONE HCL 10 MG PO TABS
20.0000 mg | ORAL_TABLET | Freq: Three times a day (TID) | ORAL | 0 refills | Status: DC
  Filled 2024-06-20: qty 90, 15d supply, fill #0

## 2024-06-20 MED ORDER — ACETAMINOPHEN 500 MG PO TABS
1000.0000 mg | ORAL_TABLET | Freq: Three times a day (TID) | ORAL | 0 refills | Status: AC
  Filled 2024-06-20: qty 30, 5d supply, fill #0

## 2024-06-20 NOTE — Progress Notes (Signed)
Notified NP of EKG results.

## 2024-06-20 NOTE — Progress Notes (Signed)
 Physical Therapy Discharge Summary  Patient Details  Name: Joseph [name] MRN: [id number] Date of Birth: [birth year]  Date of Discharge from PT service:June 20, 2024  Today's Date: 06/20/2024 PT Individual Time: 1000-1100 PT Individual Time Calculation (min): 60 min    Patient has met 8 of 8 long term goals due to improved activity tolerance, improved postural control, increased strength, increased range of motion, and decreased pain.  Patient to discharge at a wheelchair level Modified Independent.   Patient's care partner is independent to provide the necessary physical assistance at discharge.  Reasons goals not met: NA  Recommendation:  Patient will benefit from ongoing skilled PT services in outpatient setting to continue to advance safe functional mobility, address ongoing impairments in strength, endurance, energy conservation, and minimize fall risk.  Equipment: W/c and RW  Reasons for discharge: treatment goals met and discharge from hospital  Patient/family agrees with progress made and goals achieved: Yes  Skilled Therapeutic Interventions/Progress Updates:  Pt recd in w/c, reports pain controlled. Session focused on d/c prep and assessments. Pt navigated 6 steps x 12 with CGA and 2 hand rails. Participated in MMT and distance trials as documented below. Pt returned to bed after session with CGA and no RW for short ambulatory transfer. Bed mob with supervision, was left with all needs in reach and alarm active.   PT Discharge Precautions/Restrictions Precautions Precautions: Fall;Back;Shoulder Type of Shoulder Precautions: WBAT R UE Restrictions Weight Bearing Restrictions Per Provider Order: Yes RUE Weight Bearing Per Provider Order: Weight bearing as tolerated Other Position/Activity Restrictions: Per Dr. Dozier We will allow weightbearing to tolerance given his lower extremity paraplegia and need for his upper extremities  Pain Interference Pain  Interference Pain Effect on Sleep: 2. Occasionally Pain Interference with Therapy Activities: 1. Rarely or not at all Pain Interference with Day-to-Day Activities: 1. Rarely or not at all Vision/Perception  Vision - History Ability to See in Adequate Light: 0 Adequate Perception Perception: Within Functional Limits Praxis Praxis: WFL  Cognition Overall Cognitive Status: Within Functional Limits for tasks assessed Arousal/Alertness: Awake/alert Orientation Level: Oriented X4 Memory: Appears intact Awareness: Appears intact Problem Solving: Appears intact Safety/Judgment: Appears intact Sensation Sensation Light Touch: Appears Intact Hot/Cold: Appears Intact Proprioception: Appears Intact Stereognosis: Appears Intact Additional Comments: LT intact and symmetrical B LE in all dermatomes Coordination Gross Motor Movements are Fluid and Coordinated: No Fine Motor Movements are Fluid and Coordinated: No Coordination and Movement Description: RUE with mobility restrictions although RUE AROM up to 100* Motor  Motor Motor: Paraplegia Motor - Skilled Clinical Observations: generalized muscle weakness, incomplete paraplegia Motor - Discharge Observations: greatly improved from baseline  Mobility Bed Mobility Bed Mobility: Rolling Right;Rolling Left;Supine to Sit;Sit to Supine Rolling Right: Independent with assistive device Rolling Left: Independent with assistive device Supine to Sit: Independent with assistive device Sit to Supine: Independent with assistive device Transfers Transfers: Sit to Stand;Stand to Sit Sit to Stand: Independent with assistive device Stand to Sit: Independent with assistive device Stand Pivot Transfers: Independent with assistive device Transfer (Assistive device): Rolling walker Locomotion  Gait Ambulation: Yes Gait Assistance: Supervision/Verbal cueing;Dependent - Patient 0% Gait Distance (Feet): 143 Feet Assistive device: Rolling  walker Gait Gait Pattern: Decreased stride length;Decreased weight shift to right;Decreased weight shift to left;Step-through pattern Gait velocity: decreased Stairs / Additional Locomotion Stairs: Yes Stairs Assistance: Contact Guard/Touching assist Stair Management Technique: Two rails Number of Stairs: 12 Height of Stairs: 6 Ramp: Supervision/Verbal cueing Curb: Engineer, Manufacturing Wheelchair Mobility: Yes  Wheelchair Assistance: Independent with Scientist, Research (life Sciences): Both upper extremities Wheelchair Parts Management: Independent Distance: 150  Trunk/Postural Assessment  Cervical Assessment Cervical Assessment: Within Functional Limits Thoracic Assessment Thoracic Assessment: Exceptions to [facility] (spinal precaution) Lumbar Assessment Lumbar Assessment: Within Functional Limits Postural Control Postural Control: Within Functional Limits  Balance Balance Balance Assessed: Yes Static Sitting Balance Static Sitting - Balance Support: Feet supported Static Sitting - Level of Assistance: 6: Modified independent (Device/Increase time) Dynamic Sitting Balance Dynamic Sitting - Balance Support: Feet supported Dynamic Sitting - Level of Assistance: 6: Modified independent (Device/Increase time) Static Standing Balance Static Standing - Balance Support: Bilateral upper extremity supported Static Standing - Level of Assistance: 6: Modified independent (Device/Increase time) Dynamic Standing Balance Dynamic Standing - Balance Support: During functional activity Dynamic Standing - Level of Assistance: 6: Modified independent (Device/Increase time) Extremity Assessment      RLE Assessment RLE Assessment: Exceptions to [facility] Strength Comments: tests 5/5, but reports feeling strength at 82% of his baseline LLE Assessment LLE Assessment: Exceptions to [facility] Strength Comments: tests 4+/5, but reports feeling strength at 82% of  his baseline   Joseph [name] 06/20/2024, 1:00 PM

## 2024-06-20 NOTE — Plan of Care (Signed)
  Problem: RH Bathing Goal: LTG Patient will bathe all body parts with assist levels (OT) Description: LTG: Patient will bathe all body parts with assist levels (OT) Outcome: Completed/Met   Problem: RH Dressing Goal: LTG Patient will perform upper body dressing (OT) Description: LTG Patient will perform upper body dressing with assist, with/without cues (OT). Outcome: Completed/Met Goal: LTG Patient will perform lower body dressing w/assist (OT) Description: LTG: Patient will perform lower body dressing with assist, with/without cues in positioning using equipment (OT) Outcome: Completed/Met   Problem: RH Toileting Goal: LTG Patient will perform toileting task (3/3 steps) with assistance level (OT) Description: LTG: Patient will perform toileting task (3/3 steps) with assistance level (OT)  Outcome: Completed/Met   Problem: RH Toilet Transfers Goal: LTG Patient will perform toilet transfers w/assist (OT) Description: LTG: Patient will perform toilet transfers with assist, with/without cues using equipment (OT) Outcome: Completed/Met   Problem: RH Tub/Shower Transfers Goal: LTG Patient will perform tub/shower transfers w/assist (OT) Description: LTG: Patient will perform tub/shower transfers with assist, with/without cues using equipment (OT) Outcome: Completed/Met   

## 2024-06-20 NOTE — Progress Notes (Signed)
 "                                                                                                                                                                                                         Daily Progress Note   Patient Name: Joseph [name]       Date: 06/20/2024 DOB: 10-Oct-1980  Age: 43 y.o. MRN#: [id number] Attending Physician: [name], MD Primary Care Physician: Patient, No Pcp Per Admit Date: 06/02/2024  Reason for Consultation/Follow-up: Pain control  Objective: I have reviewed medical records including: EPIC notes: Nursing, PT/OT, PMR, PMT.  Target discharge date is 12/30. MAR: As needed medications administered in the last 24 hours -trazodone  x 1, oxycodone  x 4. Patient is receiving these palliative interventions for symptom management with an intent to improve quality of life  Cancer related pain Methadone  every 8 hours titrated up to 20 mg on 12/25 Oxycodone  15-20 mg every 4 hours as needed Tylenol  1000 mg 3 times daily Flexeril  5 mg as needed  Lidocaine  patch Lyrica  3 times daily Bowel regimen Senokot 2 tablets twice per day MiraLAX  daily Fleet enema once as needed for severe constipation Bisacodyl  daily as needed moderate constipation Available advanced directives in ACP: none Labs: low albumin  2.3 assessed for overall health, nutritional status, and disease severity, helping predict prognosis and guide care Intake/output: last BM 12/27 EKG: personally reviewed, 12/26 QTc was 440, which was boarderline. Goal is <450. Will get another EKG today - QTc is 442  Subjective: Received update from Daphne Satterfield, NP and Dr. Carilyn that discharge is being prepared for tomorrow. They request palliative medication prescriptions be sent by PMT. Discussed patient's case in detail with [facility] Palliative Medicine team Fannie Kidd, NP and Valrie Iba, RN - patient's outpatient follow up appointment has been scheduled for January 13th, 2026. I will send  methadone  and oxycodone  IR prescriptions at discharge to ensure he is supported until he can get to this appointment for ongoing symptom management.   Went to visit patient at bedside-no family/visitors present.  OT present and getting started with therapy session.  Patient is lying in bed awake, alert, oriented, and able to participate in conversation. No signs or non-verbal gestures of pain or discomfort noted. No respiratory distress, increased work of breathing, or secretions noted.  Patient is happy and excited about discharge tomorrow.  Therapeutic listening provided as patient and occupational therapist reflect on the significant improvements he has made since his first day at CIR. Patient has certainly made great improvements.  Discussed in detail pain management plan  at discharge, which is current regimen - no changes today.  Provided updates that an outpatient Palliative Medicine appointment has been scheduled for July 05, 2024 for ongoing symptom management and that prescriptions for methadone  and oxycodone  IR will be sent with enough to get him through to this appointment. He is also on the waitlist at this clinic if they are able to get him in sooner. Patient expresses understanding but is also hopeful to have this again reviewed with his mother present tomorrow, which I will do.  Patient expresses appreciation for PMT assistance throughout his hospitalization and time at CIR.  All questions and concerns addressed.  Length of Stay: 18  Current Medications: Scheduled Meds:   acetaminophen   1,000 mg Oral TID   apixaban   5 mg Oral BID   furosemide   40 mg Oral Daily   guaiFENesin   1,200 mg Oral BID   iron  polysaccharides  150 mg Oral Daily   lidocaine   2 patch Transdermal Daily   methadone   20 mg Oral Q8H   multivitamin with minerals  1 tablet Oral Daily   polyethylene glycol  17 g Oral Daily   potassium chloride   20 mEq Oral Daily   pregabalin   50 mg Oral TID   Ensure Max  Protein  11 oz Oral BID   senna-docusate  2 tablet Oral BID   tamsulosin   0.4 mg Oral QPC supper    Continuous Infusions:   PRN Meds: albuterol , alum & mag hydroxide-simeth, bisacodyl , cyclobenzaprine , diphenhydrAMINE , lidocaine , ondansetron  **OR** ondansetron  (ZOFRAN ) IV, oxyCODONE , phenol, sodium phosphate , traZODone   Physical Exam Vitals and nursing note reviewed.  Constitutional:      General: He is not in acute distress. Pulmonary:     Effort: Pulmonary effort is normal. No respiratory distress.  Skin:    General: Skin is warm and dry.  Neurological:     Mental Status: He is alert and oriented to person, place, and time.  Psychiatric:        Attention and Perception: Attention normal.        Behavior: Behavior is cooperative.        Cognition and Memory: Cognition and memory normal.             Vital Signs: BP 122/63 (BP Location: Right Arm)   Pulse 100   Temp 98.4 F (36.9 C) (Oral)   Resp 18   Ht 5' 9 (1.753 m)   Wt (!) 160.7 kg   SpO2 94%   BMI 52.32 kg/m  SpO2: SpO2: 94 % O2 Device: O2 Device: Room Air O2 Flow Rate:    Intake/output summary:  Intake/Output Summary (Last 24 hours) at 06/20/2024 0953 Last data filed at 06/20/2024 9185 Gross per 24 hour  Intake 1074 ml  Output 1375 ml  Net -301 ml   LBM: Last BM Date : 06/18/24 Baseline Weight: Weight: (!) 148.7 kg Most recent weight: Weight: (!) 160.7 kg       Palliative Assessment/Data: PPS 60-70%      Patient Active Problem List   Diagnosis Date Noted   Depression with anxiety 06/10/2024   Stress and adjustment reaction 06/07/2024   Spinal cord injury at T1-T6 level (HCC) 06/02/2024   Malignant neoplasm metastatic to bone (HCC) 05/24/2024   Normocytic anemia 05/21/2024   Pathologic humeral fracture 05/21/2024   Pathological fracture of right humerus 05/20/2024   Acute incomplete paraplegia (HCC) 05/17/2024   Pathologic fracture of thoracic vertebrae 05/16/2024   Papillary thyroid   carcinoma (HCC) 05/10/2024  Malignant neoplasm metastatic to both lungs (HCC) 05/10/2024   Metastatic cancer to spine (HCC) 05/06/2024   Papillary carcinoma (HCC) 05/06/2024   Thoracic spine fracture (HCC) 05/02/2024   Ataxia 05/01/2024   Right shoulder pain 05/01/2024   Pulmonary HTN (HCC) 04/29/2024   Low back pain 04/29/2024   Pulmonary nodules 04/29/2024   Thyroid  nodule 04/29/2024   Pulmonary embolism (HCC) 04/28/2024   Abscess 03/31/2018   Thrombocytopenia 03/31/2018   Depression 03/31/2018   Abscess of hand 03/31/2018   Abscess of right hand 03/31/2018   GAD (generalized anxiety disorder) 12/04/2014   Alcohol use disorder, severe, dependence (HCC) 12/03/2014   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    Patient was admitted 11/5-11/24 for post-op management of posterior T1-3 instrumentation fusion T2 corpectomy for resection of tumor, acute PE, thyroid  biopsy confirming papillary carcinoma, acute blood loss anemia due to procedure. He was then in CIR from 11/24-11/28, though participation in therapy was limited due to an acute fracture/metastasis. Patient was seen by PMT for pain control during readmission 11/29-12/11 for pathological fracture of right humerus. He has been in CIR since then with upcoming tumor board to discuss thyroidectomy.   Assessment: Principal Problem:   Acute  incomplete paraplegia [facility]) Active Problems:   Spinal cord injury at T1-T6 level [facility])   Stress and adjustment reaction   Depression with anxiety   Recommendations/Plan: Plan is for discharge from CIR tomorrow 12/30 Continue current pain regimen as noted in Objective - no changes today Patient has an outpatient Palliative Medicine appointment at the [facility] scheduled for July 05, 2024 for ongoing symptom management Discharge prescriptions for these medications have been written as a 15 day supply, which should be enough until outpatient follow up: Methadone  10 mg: Take 2 tablets (20 mg total) every 8 hours. Disp 90, R-0. Oxycodone  IR 20mg : Take 1 tablet every 4 hours as needed for breakthrough pain. Max 4 tablets daily. Disp 60, R-0. PMT will continue to follow and support holistically  Goals of Care and Additional Recommendations: Limitations on Scope of Treatment: Full Scope Treatment  Code Status:    Code Status Orders  (From admission, onward)           Start     Ordered   06/02/24 1758  Full code  Continuous       Question:  By:  Answer:  Consent: discussion documented in EHR   06/02/24 1757           Code Status History     Date Active Date Inactive Code Status Order ID Comments User Context   05/21/2024 0131 06/02/2024 1751 Full Code [id number]  Charlton Evalene RAMAN, MD Inpatient   05/16/2024 1529 05/21/2024 0023 Full Code [id number]  Pegge Toribio PARAS, PA-C Inpatient   05/16/2024 1529 05/16/2024 1529 Full Code [id number]  Pegge Toribio PARAS, PA-C Inpatient   04/28/2024 2016 05/16/2024 1523 Full Code [id number]  Arthea Child, MD Inpatient   03/31/2018 2330 04/02/2018 1721 Full Code [id number]  Alfornia Madison, MD Inpatient   12/02/2014 2154 12/07/2014 0901 Full Code [id number]  Cathryne Sherrell BRAVO, NP Inpatient   12/02/2014 1731 12/02/2014 2154 Full Code [id number]  Vicky Lamar [facility]       Prognosis:  Unable to determine  Discharge Planning: Home with  Palliative Services  Care plan was discussed with primary RN, patient, Athena Pickenpack-NP, Maygan Hodge-RN, Brandi Lawrence-NP/PMR, Dr. Jeryl, Dr. Carilyn  Thank you for allowing the Palliative Medicine Team to assist in the care of this patient.   Total Time 90 minutes Prolonged Time Billed  Yes        Jeoffrey CHRISTELLA Sharps, NP  Please contact Palliative Medicine Team phone at [phone] for questions and concerns.   *Portions of this note are a verbal dictation therefore any spelling and/or grammatical errors are due to the Dragon Medical One system interpretation.     "

## 2024-06-20 NOTE — Plan of Care (Signed)
" °  Problem: Coping: Goal: Ability to verbalize frustrations and anger appropriately will improve 06/20/2024 2259 by Arno Pears, RN Outcome: Progressing 06/20/2024 2259 by Arno Pears, RN Outcome: Progressing Goal: Ability to demonstrate self-control will improve 06/20/2024 2259 by Arno Pears, RN Outcome: Progressing 06/20/2024 2259 by Arno Pears, RN Outcome: Progressing   Problem: Health Behavior/Discharge Planning: Goal: Identification of resources available to assist in meeting health care needs will improve 06/20/2024 2259 by Arno Pears, RN Outcome: Progressing 06/20/2024 2259 by Arno Pears, RN Outcome: Progressing Goal: Compliance with treatment plan for underlying cause of condition will improve 06/20/2024 2259 by Arno Pears, RN Outcome: Progressing 06/20/2024 2259 by Arno Pears, RN Outcome: Progressing   Problem: Safety: Goal: Periods of time without injury will increase 06/20/2024 2259 by Arno Pears, RN Outcome: Progressing 06/20/2024 2259 by Arno Pears, RN Outcome: Progressing   "

## 2024-06-20 NOTE — Progress Notes (Signed)
 "                                                        PROGRESS NOTE   Subjective/Complaints:  Pt doing well again, slept ok, pain well managed. LBM yesterday per pt but not documented, urinating fine. No other complaints or concerns.   ROS: as per HPI. Denies rhinorrhea, cough, congestion, CP, SOB, abd pain, N/V/D/C, or any other complaints at this time.   +peripheral edema-improving +sore throat   Objective:   No results found.  Recent Labs    06/20/24 0547  WBC 5.4  HGB 10.7*  HCT 33.9*  PLT PLATELET CLUMPS NOTED ON SMEAR, UNABLE TO ESTIMATE      Recent Labs    06/18/24 0616 06/20/24 0547  NA 135 136  K 4.2 4.2  CL 98 99  CO2 28 29  GLUCOSE 94 105*  BUN 12 15  CREATININE 0.69 0.69  CALCIUM 9.3 9.2     Intake/Output Summary (Last 24 hours) at 06/20/2024 1024 Last data filed at 06/20/2024 1013 Gross per 24 hour  Intake 720 ml  Output 1775 ml  Net -1055 ml        Physical Exam: Vital Signs Blood pressure 122/63, pulse 100, temperature 98.4 F (36.9 C), temperature source Oral, resp. rate 18, height 5' 9 (1.753 m), weight (!) 160.7 kg, SpO2 94%.     General: No acute distress Mood and affect are appropriate Heart: Regular rate and rhythm no rubs murmurs or extra sounds Lungs: Clear to auscultation, breathing unlabored, no rales or wheezes Abdomen: Positive bowel sounds, soft nontender to palpation, nondistended Extremities:2+ LE edema   PRIOR EXAMS: Musculoskeletal:     Cervical back: Neck supple.  Neuro:     LUE 5/5 RUE- at least 3/5 proximally; and 5-/5 distally- limited by pain and surgery of proximal humerus RLE- HF 3-/5; KE/KF 4+/5; DF 4/5 and PF 3-/5 LLE- HF 3+/5; KE/KF 5-/5 and DF/PF 5-/5   clonus B/L--no changes No hoffman's MAS of 1 in LE's-  Intact to light touch in all 4 extremities   Assessment/Plan: 1. Functional deficits which require 3+ hours per day of interdisciplinary therapy in a comprehensive inpatient rehab  setting. Physiatrist is providing close team supervision and 24 hour management of active medical problems listed below. Physiatrist and rehab team continue to assess barriers to discharge/monitor patient progress toward functional and medical goals  Care Tool:  Bathing    Body parts bathed by patient: Right arm, Chest, Abdomen, Front perineal area, Right upper leg, Left upper leg, Right lower leg, Left lower leg, Face, Left arm, Buttocks   Body parts bathed by helper: Buttocks     Bathing assist Assist Level: Independent with assistive device     Upper Body Dressing/Undressing Upper body dressing   What is the patient wearing?: Pull over shirt    Upper body assist Assist Level: Independent with assistive device    Lower Body Dressing/Undressing Lower body dressing      What is the patient wearing?: Underwear/pull up, Pants     Lower body assist Assist for lower body dressing: Independent with assitive device Assistive Device Comment: reacher   Toileting Toileting    Toileting assist Assist for toileting: Independent with assistive device     Transfers Chair/bed transfer  Transfers assist  Chair/bed transfer assist level: Independent with assistive device     Locomotion Ambulation   Ambulation assist      Assist level: Supervision/Verbal cueing Assistive device: Walker-rolling Max distance: 92   Walk 10 feet activity   Assist     Assist level: Supervision/Verbal cueing Assistive device: Walker-rolling   Walk 50 feet activity   Assist Walk 50 feet with 2 turns activity did not occur: Safety/medical concerns (weakness/fatigue/pain)  Assist level: Supervision/Verbal cueing Assistive device: Walker-rolling    Walk 150 feet activity   Assist Walk 150 feet activity did not occur: Safety/medical concerns (weakness/fatigue/pain)         Walk 10 feet on uneven surface  activity   Assist Walk 10 feet on uneven surfaces activity did  not occur: Safety/medical concerns (weakness/fatigue/pain)   Assist level: Contact Guard/Touching assist (outdoor surfaces.) Assistive device: Walker-rolling   Wheelchair     Assist Is the patient using a wheelchair?: Yes Type of Wheelchair: Manual    Wheelchair assist level: Maximal Assistance - Patient 25 - 49% (B LE and L UE) Max wheelchair distance: 10 ft    Wheelchair 50 feet with 2 turns activity    Assist        Assist Level: Total Assistance - Patient < 25%   Wheelchair 150 feet activity     Assist      Assist Level: Total Assistance - Patient < 25%   Blood pressure 122/63, pulse 100, temperature 98.4 F (36.9 C), temperature source Oral, resp. rate 18, height 5' 9 (1.753 m), weight (!) 160.7 kg, SpO2 94%.   Medical Problem List and Plan: 1. 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  Hx T2 corpectomy with T1-3 decomp/fusion (05/05/24)             -patient may  shower             -ELOS/Goals: ~ 12/30- supervision             Admit to CIR Appropriate for CIR- didn't finish first time due to R humeral pathological fx due to Thyroid  cancer mets   Continue ACE wraps- written for- educated pt and nursing that needs them for swelling. Needs to keep elevating legs  -Continue CIR therapies including PT, OT   2.  Antithrombotics: -DVT/anticoagulation:  Mechanical: Sequential compression devices, below knee Bilateral lower extremities Pharmaceutical: Eliquis  5mg  BID due to PE 11/10- RUL segmental PE             -antiplatelet therapy: N/A   3. Pain Management: Robaxin  750 TID AC/HS, Lyrica  50 mg TID, Methadone  10 mg TID. Flexeril  10mg  TID, lidoderm  patches. Oxycodone  30 mg q3 hours as needed for breakthrough pain. Palliative care to follow.  12/16- Called Palliative care- will see if can increase Methadone  with goal to reduce Oxycodone  - see if can move Oxy to q4 hours? con't regimen  for now, but called and placed consult 12/18- Methadone  up to 12.5 mg q8 hours and Oxycodone  30 mg q3 hours- 5 dose sin last 24 hours- they also started scheduled Tylenol  and stopped Robaxin - weaning flexeril  12/19- increased methadone  today- on 15 methadone  q8 hours- and Oxy 30 mg q4 hours- to change dosing Monday 12/29- pain controlled on Methadone  20mg  q8Hr and Oxy IR 20mg  QID ( on average)   4. Mood/Behavior/Sleep: LCSW to follow for evaluation and support when available.              -antipsychotic  agents: n/a   -Trazodone  PRN  5. Neuropsych/cognition: This patient is capable of making decisions on his own behalf.   6. Skin/Wound Care: Routine pressure relief measures. Monitor incisions   7. Fluids/Electrolytes/Nutrition: Monitor I&O and weight. Follow up labs CBC/CMP, continue vitamins/supplements.    8. 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 Completed course of dexamethasone . Patient to follow up outpatient with ENT, endocrinology and radiation oncology. ENT plan to discuss at multidisciplinary tumor board on 06/08/24.             - Will likely need Thyroidectomy and RAI-waiting for tumor board 12/16-12/17 should occur at tumor board 12/17 - waiting to hear from Oncology  12/18- waiting for note form Oncology -will reach out about it -06/12/24 see ENT note from 12/20, plan for likely thyroidectomy, outpatient f/up with PET/etc.  F/U Dr. Reyes Alexander after DC  9. Pathological right proximal humerus fracture: s/p IMR 11/29. WBAT in RUE due to needing it- use platform walker.  Ortho follow up in 2 weeks for recheck and updated xrays.  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 Patient underwent instrumentation and fusion on prior admission. Radiation Onc plans 2 weeks of radiotherapy to thoracic spine starts 06/02/24- right humerus on  06/09/24.    Radiation completed 12/24  11. Hx of RUL segmental  PE: dx on 11/6 provoked secondary to metastatic cancer. Continue Eliquis  for 3-6 months.   12. Incomplete Paraplegia/Neurogenic bladder: Monitor bladder scan with PVR  12/12- will order in/out caths and bladder scans q6 hours- to see if needs cathing -12/13-14/25 no cathing overnight, urinating well   12/16- PVR's 4 to 126 cc in last 24 hours 12/17- Will start Flomax - we both agree he's not emptying all the time-  0.4 mg q supper- hopefully will help frequency  12/26 voiding continently with low PVR's  -06/18/24 still low PVRs, will d/c now, voiding well  13. Chronic normocytic anemia: Stable, continue Niferex 150 mg daily   -12/25 HGB stable 10.4   14. Constipation/mild neurogenic bowel: Continue Miralax   daily and Senokot S 2 tabs BID -12/27 LBM  2d ago, if no BM by tomorrow will do MoM 15ml-- had BM afternoon 12/27, no MoM needed  15. Polysubstance use disorder- will have palliative care f/u with meds after d/c for wean   12/16- called palliative care to help with this  16. LE edema B/L 12/17- will order ACE wraps and d/w pt elevating his legs to level of heart when not in therapy -06/12/24 pt stating a little more swelling, advised use of ACE wraps, but will give one dose of Lasix  20mg  today-- advised that his albumin  is also low so lasix  alone may not help a ton-- encouraged protein intake.  Filed Weights   06/02/24 1700 06/16/24 0719  Weight: (!) 148.7 kg (!) 160.7 kg     -12/26 has received 3 doses of lasix .  -no lasix  for 2 days. Looks and feels about the same but still appears swollen and tight. -weight is up quite a bit    -continue to elevate and wrap. Lyrica  could be contributing   -will begin standing lasix  dose of 40mg  daily for now   -check bmet in am 12/27 -06/18/24 BMP fine, cont lasix , but might really benefit from albumin -- at this point basically anasarca-- a little better 12/28  17.  Morbid  obesity  LOS:  18 days A FACE TO FACE EVALUATION WAS PERFORMED  Prentice FORBES Compton 06/20/2024, 10:24 AM     "

## 2024-06-20 NOTE — Progress Notes (Signed)
 Inpatient Rehabilitation Care Coordinator Discharge Note   Patient Details  Name: Joseph [name] MRN: [id number] Date of Birth: 1980/11/15   Discharge location: D/c to his aunt's home with support from family  Length of Stay: 18 days  Discharge activity level: Supervision to Min A  Home/community participation: Limited  Patient response un:Yzjouy Literacy - How often do you need to have someone help you when you read instructions, pamphlets, or other written material from your doctor or pharmacy?: Never  Patient response un:Dnrpjo Isolation - How often do you feel lonely or isolated from those around you?: Rarely  Services provided included: MD, RD, PT, OT, RN, CM, Pharmacy, Neuropsych, SW, TR  Financial Services:  Field Seismologist Utilized: Private Insurance [place] tailored Plan  Choices offered to/list presented to: patient and mother  Follow-up services arranged:  Outpatient, DME    Outpatient Servicies: [facility] for PT/OT DME : [facility] for bariatric w/c    Patient response to transportation need: Is the patient able to respond to transportation needs?: Yes In the past 12 months, has lack of transportation kept you from medical appointments or from getting medications?: No In the past 12 months, has lack of transportation kept you from meetings, work, or from getting things needed for daily living?: No   Patient/Family verbalized understanding of follow-up arrangements:  Yes  Individual responsible for coordination of the follow-up plan: contact pt mother  Confirmed correct DME delivered: [name] 06/20/2024    Comments (or additional information):fam edu completed  Summary of Stay    Date/Time Discharge Planning CSW  06/14/24 0856 Pt will d/c to home to his aunt's home and she can only provide supervision as she is currently in chemo treatment. He will have intermittent supervision as there is no one that can be there during the  day due to work schedule. Family can bring him a meal if needed, but no more than that during the day. SW explained limitations of options due to insurance. PCS referral submitted. CAP/DA to be submitted by [employer]. Fam edu on 12/261pm-4pm. SW will confirm there are no barriers to discharge. DS  06/10/24 1237 Pt will d/c to home to his aunt's home and she can only provide supervision as she is currently in chemo treatment. He will have intermittent supervision as there is no one that can be there during the day due to work schedule. Family can bring him a meal if needed, but no more than that during the day. SW explained limitations of options due to insurance. PCS referral submitted. CAP/DA to be submitted by [employer]. Fam edu on 12/261pm-4pm. SW will confirm there are no barriers to discharge. AAC  06/07/24 0935 Pt will d/c to home to his aunt's home and she can only provide supervision as she is currently in chemo treatment. He will have intermittent supervision as there is no one that can be there during the day due to work schedule. Family can bring him a meal if needed, but no more than that during the day. SW explained limitations of options due to insurance. SW will submit PCS referral to insurance once d/c date is set. SW will explore CAP/DA and see if this service is an option. SW will confirm there are no barriers to discharge. AAC       Rhet Rorke A Jude

## 2024-06-20 NOTE — Plan of Care (Signed)
°  Problem: Consults Goal: RH SPINAL CORD INJURY PATIENT EDUCATION Description:  See Patient Education module for education specifics.  Outcome: Progressing   Problem: SCI BOWEL ELIMINATION Goal: RH STG MANAGE BOWEL WITH ASSISTANCE Description: STG Manage Bowel with supervision Assistance. Outcome: Progressing   Problem: SCI BLADDER ELIMINATION Goal: RH STG MANAGE BLADDER WITH ASSISTANCE Description: STG Manage Bladder With supervision Assistance Outcome: Progressing   Problem: RH SKIN INTEGRITY Goal: RH STG SKIN FREE OF INFECTION/BREAKDOWN Description: Manage skin free from infection with supervision assistance Outcome: Progressing   Problem: RH SAFETY Goal: RH STG ADHERE TO SAFETY PRECAUTIONS W/ASSISTANCE/DEVICE Description: STG Adhere to Safety Precautions With supervision Assistance/Device. Outcome: Progressing   Problem: RH PAIN MANAGEMENT Goal: RH STG PAIN MANAGED AT OR BELOW PT'S PAIN GOAL Description: <4 w/ prns Outcome: Progressing   Problem: RH KNOWLEDGE DEFICIT SCI Goal: RH STG INCREASE KNOWLEDGE OF SELF CARE AFTER SCI Outcome: Progressing

## 2024-06-20 NOTE — Progress Notes (Signed)
 Occupational Therapy Discharge Summary  Patient Details  Name: Joseph [name] MRN: [id number] Date of Birth: [birth year]  Date of Discharge from OT service:June 20, 2024  Today's Date: 06/20/2024 OT Individual Time: 0800-0900 & 1300-1415 OT Individual Time Calculation (min): 60 min & 75 min   Patient has met 6 of 6 long term goals due to improved activity tolerance, improved balance, postural control, ability to compensate for deficits, functional use of  RIGHT upper extremity, and improved coordination.  Patient to discharge at overall Modified Independent level.  Patient's care partner is independent to provide the necessary physical assistance at discharge.    Reasons goals not met: all goals met   Recommendation:  Patient will benefit from ongoing skilled OT services in outpatient setting to continue to advance functional skills in the area of BADL, iADL, Vocation, and Reduce care partner burden.  Equipment: W/C, TTB  Reasons for discharge: treatment goals met and discharge from hospital  Patient/family agrees with progress made and goals achieved: Yes  OT Discharge Precautions/Restrictions  Precautions Precautions: Fall;Back;Shoulder Type of Shoulder Precautions: WBAT R UE Recall of Precautions/Restrictions: Intact Restrictions Weight Bearing Restrictions Per Provider Order: Yes RUE Weight Bearing Per Provider Order: Weight bearing as tolerated RLE Weight Bearing Per Provider Order: Weight bearing as tolerated LLE Weight Bearing Per Provider Order: Weight bearing as tolerated Other Position/Activity Restrictions: Per Dr. Dozier We will allow weightbearing to tolerance given his lower extremity paraplegia and need for his upper extremities ADL ADL Eating: Modified independent Where Assessed-Eating: Wheelchair Grooming: Modified independent Where Assessed-Grooming: Standing at sink, Sitting at sink Upper Body Bathing: Modified independent Where Assessed-Upper  Body Bathing: Shower Lower Body Bathing: Modified independent Where Assessed-Lower Body Bathing: Shower Upper Body Dressing: Modified independent (Device) Where Assessed-Upper Body Dressing: Edge of bed Lower Body Dressing: Modified independent Where Assessed-Lower Body Dressing: Edge of bed Toileting: Modified independent Where Assessed-Toileting: Bedside Commode Toilet Transfer: Modified independent Toilet Transfer Method: Proofreader: Psychiatric Nurse: Modified independent Film/video Editor Method: Designer, Industrial/product: Emergency planning/management officer Vision Baseline Vision/History: 0 No visual deficits Patient Visual Report: No change from baseline Vision Assessment?: No apparent visual deficits Perception  Perception: Within Functional Limits Praxis Praxis: WFL Cognition Cognition Overall Cognitive Status: Within Functional Limits for tasks assessed Arousal/Alertness: Awake/alert Orientation Level: Person;Place;Situation Person: Oriented Place: Oriented Situation: Oriented Memory: Appears intact Awareness: Appears intact Problem Solving: Appears intact Safety/Judgment: Appears intact Brief Interview for Mental Status (BIMS) Repetition of Three Words (First Attempt): 3 Temporal Orientation: Year: Correct Temporal Orientation: Month: Accurate within 5 days Temporal Orientation: Day: Correct Recall: Sock: Yes, no cue required Recall: Blue: Yes, no cue required Recall: Bed: Yes, no cue required BIMS Summary Score: 15 Sensation Sensation Light Touch: Appears Intact Hot/Cold: Appears Intact Proprioception: Appears Intact Stereognosis: Appears Intact Additional Comments: LT intact and symmetrical B LE in all dermatomes Coordination Gross Motor Movements are Fluid and Coordinated: No Fine Motor Movements are Fluid and Coordinated: No Coordination and Movement Description: RUE with mobility restrictions  although RUE AROM up to 100* Motor  Motor Motor: Paraplegia Motor - Skilled Clinical Observations: generalized muscle weakness, incomplete paraplegia Mobility  Bed Mobility Bed Mobility: Rolling Right;Rolling Left;Supine to Sit;Sit to Supine Rolling Right: Independent with assistive device Rolling Left: Independent with assistive device Supine to Sit: Independent with assistive device Sit to Supine: Independent with assistive device Transfers Sit to Stand: Independent with assistive device Stand to Sit: Independent with assistive device  Trunk/Postural Assessment  Cervical  Assessment Cervical Assessment: Within Functional Limits Thoracic Assessment Thoracic Assessment: Within Functional Limits Lumbar Assessment Lumbar Assessment: Within Functional Limits Postural Control Postural Control: Within Functional Limits  Balance Balance Balance Assessed: Yes Static Sitting Balance Static Sitting - Balance Support: Feet supported Static Sitting - Level of Assistance: 6: Modified independent (Device/Increase time) Dynamic Sitting Balance Dynamic Sitting - Balance Support: Feet supported Dynamic Sitting - Level of Assistance: 6: Modified independent (Device/Increase time) Static Standing Balance Static Standing - Level of Assistance: 6: Modified independent (Device/Increase time) Dynamic Standing Balance Dynamic Standing - Balance Support: During functional activity Dynamic Standing - Level of Assistance: 6: Modified independent (Device/Increase time) Extremity/Trunk Assessment RUE Assessment RUE Assessment: Exceptions to [facility] Active Range of Motion (AROM) Comments: 100* active shoulder flexion, WNL distally General Strength Comments: no strength test proximal d/t precautions, WNL distally LUE Assessment LUE Assessment: Within Functional Limits   Session 1 General: Pt supine in bed upon OT arrival, agreeable to OT session. OT granting pt mod I within room with RW. Pt pleased  with progress.  Pain:  5/10 pain reported in Rt shoulder, activity, intermittent rest breaks, distractions provided for pain management, pt reports tolerable to proceed.   ADL: Pt completing full ADL this session with mod I and no LOB. Pt able to ambulate to bathroom with RW with mod I. Pt able to incorporate RUE into ADL tasks without cueing. Pt bathed, got dressed with use of reacher and ambulated back to room with Mod I with RW.    Pt seated in W/C at end of session with call light within reach and 4Ps assessed.    Session 2 General: Pt supine in bed upon OT arrival, agreeable to OT session.  Pain: 8/10 pain reported in shoulder, activity, intermittent rest breaks, distractions provided for pain management, pt reports tolerable to proceed.   Exercises: Pt issued UE HEP in order to increase functional strength, andurance and activity tolerance in order to increase independence in ADLs with Rt shoulder. OT discussing direction/technique of exercises, demonstrating verbal understanding. Pt completed exercises listed below: - Supine Shoulder Flexion AAROM with Hands Clasped  - 3 sets - 10 reps - Circular Shoulder Pendulum with Table Support  - 3 sets - 10 reps - Horizontal Shoulder Pendulum with Table Support  - 3 sets - 10 reps - Standing Single Arm Shoulder Flexion Stretch on Wall  - 3 sets - 10 reps  Other Treatments: OT completing RUE PROM and AAROM in all planes supine on mat table for gravity eliminated position. Pt tolerating well and noting increased ROM after stretching.   Pt seated in W/C at end of session with W/C alarm donned, call light within reach and 4Ps assessed.    Camie Hoe, OTD, OTR/L 06/20/2024, 4:30 PM

## 2024-06-20 NOTE — Progress Notes (Signed)
 Inpatient Rehabilitation Discharge Medication Review by a Pharmacist  A complete drug regimen review was completed for this patient to identify any potential clinically significant medication issues.  High Risk Drug Classes Is patient taking? Indication by Medication  Antipsychotic No   Anticoagulant Yes Apixaban - PE 04/2024  Antibiotic No   Opioid Yes oxyIR- breakthrough pain Methadone - chronic pain  Antiplatelet No   Hypoglycemics/insulin No   Vasoactive Medication Yes Lasix - HTN Flomax - BPH  Chemotherapy No   Other Yes Lyrica - neuropathic pain Trazodone - sleep Flexeril - muscle spasms     Type of Medication Issue Identified Description of Issue Recommendation(s)  Drug Interaction(s) (clinically significant)     Duplicate Therapy     Allergy     No Medication Administration End Date     Incorrect Dose     Additional Drug Therapy Needed     Significant med changes from prior encounter (inform family/care partners about these prior to discharge).    Other       Clinically significant medication issues were identified that warrant physician communication and completion of prescribed/recommended actions by midnight of the next day:  No   Time spent performing this drug regimen review (minutes):  30    Mili Piltz BS, PharmD, BCPS Clinical Pharmacist 06/20/2024 3:02 PM  Contact: [phone] after 3 PM

## 2024-06-21 ENCOUNTER — Other Ambulatory Visit (HOSPITAL_COMMUNITY): Payer: Self-pay

## 2024-06-21 ENCOUNTER — Telehealth (HOSPITAL_COMMUNITY): Payer: Self-pay

## 2024-06-21 ENCOUNTER — Ambulatory Visit: Payer: MEDICAID

## 2024-06-21 MED ORDER — FLUTICASONE PROPIONATE 50 MCG/ACT NA SUSP
2.0000 | Freq: Every day | NASAL | 0 refills | Status: AC
  Filled 2024-06-21: qty 16, 30d supply, fill #0

## 2024-06-21 MED ORDER — NALOXONE HCL 0.4 MG/ML IJ SOLN
0.4000 mg | INTRAMUSCULAR | 0 refills | Status: AC | PRN
  Filled 2024-06-21: qty 2, fill #0

## 2024-06-21 MED ORDER — FLUTICASONE PROPIONATE 50 MCG/ACT NA SUSP
2.0000 | Freq: Every day | NASAL | Status: DC
  Filled 2024-06-21: qty 16

## 2024-06-21 MED ORDER — OXYCODONE HCL 10 MG PO TABS
20.0000 mg | ORAL_TABLET | ORAL | 0 refills | Status: DC | PRN
  Filled 2024-06-21: qty 120, 15d supply, fill #0

## 2024-06-21 NOTE — Progress Notes (Signed)
 "                                                                                                                                                                                                         Daily Progress Note   Patient Name: Joseph [name]       Date: 06/21/2024 DOB: 02-03-1981  Age: 43 y.o. MRN#: [id number] Attending Physician: [name], MD Primary Care Physician: Patient, No Pcp Per Admit Date: 06/02/2024  Reason for Consultation/Follow-up: Pain control  Objective: I have reviewed medical records including: EPIC notes: PMR, PT/OT, nursing.  Target discharge date is 12/30. MAR: As needed medications administered in the last 24 hours -oxycodone  x 3. Patient is receiving these palliative interventions for symptom management with an intent to improve quality of life  Cancer related pain Methadone  20mg  every 8 hours, last titration up was 12/25 Oxycodone  15-20 mg every 4 hours as needed Tylenol  1000 mg 3 times daily Flexeril  5 mg as needed  Lidocaine  patch Lyrica  3 times daily Bowel regimen Senokot 2 tablets twice per day MiraLAX  daily Fleet enema once as needed for severe constipation Bisacodyl  daily as needed moderate constipation Intake/output: last BM 12/27 EKG: 12/29 QTc was 442  Subjective: Went to visit patient at bedside-mother/Paula and father/McGuffey present.  Patient was sitting up in a wheelchair awake, alert, oriented, and able to participate in conversation. No signs or non-verbal gestures of pain or discomfort noted. No respiratory distress, increased work of breathing, or secretions noted.   Patient tells me he feels like 1 million bucks.  He is very excited to be discharging today.  Therapeutic listening provided as he reflects on his interval history since for CIR admission.  Medication education provided again to patient along with his family at bedside.  Discussed methadone  and oxycodone  prescriptions and management after discharge.  Patient  understands oxycodone  prescription was ordered for maximum of 4 doses per day (8 tablets) -if he exceeds this he will run out early.  Patient and family expressed understanding of symptom management plan.  Reviewed that patient has an outpatient Palliative Medicine appointment at [facility] on July 05, 2024 for ongoing goals of care and symptom management.  Patient and family expressed understanding of information and appreciation for PMT assistance today.  All questions and concerns addressed. Encouraged to call with questions and/or concerns. PMT card provided.  Discussed oxycodone  prescription with [facility] pharmacy -they do not carry 20 mg tablets and they request new prescription be written for 10 mg tablets.  New prescription was sent and pharmacy will  discontinue the prescription sent yesterday.  Length of Stay: 19  Current Medications: Scheduled Meds:   acetaminophen   1,000 mg Oral TID   apixaban   5 mg Oral BID   fluticasone  2 spray Each Nare Daily   furosemide   40 mg Oral Daily   guaiFENesin   1,200 mg Oral BID   iron  polysaccharides  150 mg Oral Daily   lidocaine   2 patch Transdermal Daily   methadone   20 mg Oral Q8H   multivitamin with minerals  1 tablet Oral Daily   polyethylene glycol  17 g Oral Daily   potassium chloride   20 mEq Oral Daily   pregabalin   50 mg Oral TID   Ensure Max Protein  11 oz Oral BID   senna-docusate  2 tablet Oral BID   tamsulosin   0.4 mg Oral QPC supper    Continuous Infusions:   PRN Meds: albuterol , alum & mag hydroxide-simeth, bisacodyl , cyclobenzaprine , diphenhydrAMINE , lidocaine , ondansetron  **OR** ondansetron  (ZOFRAN ) IV, oxyCODONE , phenol, sodium phosphate , traZODone   Physical Exam Vitals and nursing note reviewed.  Constitutional:      General: He is not in acute distress. Pulmonary:     Effort: Pulmonary effort is normal. No respiratory distress.  Skin:    General: Skin is warm and dry.  Neurological:     Mental Status: He is alert  and oriented to person, place, and time.  Psychiatric:        Attention and Perception: Attention normal.        Behavior: Behavior is cooperative.        Cognition and Memory: Cognition and memory normal.             Vital Signs: BP 122/63 (BP Location: Right Arm)   Pulse 91   Temp 98.4 F (36.9 C) (Oral)   Resp 18   Ht 5' 9 (1.753 m)   Wt (!) 160.7 kg   SpO2 98%   BMI 52.32 kg/m  SpO2: SpO2: 98 % O2 Device: O2 Device: Room Air O2 Flow Rate:    Intake/output summary:  Intake/Output Summary (Last 24 hours) at 06/21/2024 9177 Last data filed at 06/20/2024 1841 Gross per 24 hour  Intake 480 ml  Output 1100 ml  Net -620 ml   LBM: Last BM Date : 06/18/24 Baseline Weight: Weight: (!) 148.7 kg Most recent weight: Weight: (!) 160.7 kg       Palliative Assessment/Data: PPS 60-70%      Patient Active Problem List   Diagnosis Date Noted   Depression with anxiety 06/10/2024   Stress and adjustment reaction 06/07/2024   Spinal cord injury at T1-T6 level (HCC) 06/02/2024   Malignant neoplasm metastatic to bone (HCC) 05/24/2024   Normocytic anemia 05/21/2024   Pathologic humeral fracture 05/21/2024   Pathological fracture of right humerus 05/20/2024   Acute incomplete paraplegia (HCC) 05/17/2024   Pathologic fracture of thoracic vertebrae 05/16/2024   Papillary thyroid  carcinoma (HCC) 05/10/2024   Malignant neoplasm metastatic to both lungs (HCC) 05/10/2024   Metastatic cancer to spine (HCC) 05/06/2024   Papillary carcinoma (HCC) 05/06/2024   Thoracic spine fracture (HCC) 05/02/2024   Ataxia 05/01/2024   Right shoulder pain 05/01/2024   Pulmonary HTN (HCC) 04/29/2024   Low back pain 04/29/2024   Pulmonary nodules 04/29/2024   Thyroid  nodule 04/29/2024   Pulmonary embolism (HCC) 04/28/2024   Abscess 03/31/2018   Thrombocytopenia 03/31/2018   Depression 03/31/2018   Abscess of hand 03/31/2018   Abscess of right hand 03/31/2018   GAD (generalized  anxiety disorder)  12/04/2014   Alcohol use disorder, severe, dependence (HCC) 12/03/2014   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    Patient was admitted 11/5-11/24 for post-op management of posterior T1-3 instrumentation fusion T2 corpectomy for resection of tumor, acute PE, thyroid  biopsy confirming papillary carcinoma, acute blood loss anemia due to procedure. He was then in CIR from 11/24-11/28, though participation in therapy was limited due to an acute fracture/metastasis. Patient was seen by PMT for pain control during readmission 11/29-12/11 for pathological fracture of right humerus. He has been in CIR since then with upcoming tumor board to discuss thyroidectomy.   Assessment: Principal Problem:   Acute incomplete paraplegia [facility]) Active Problems:   Spinal cord injury at T1-T6 level [facility])   Stress and adjustment reaction   Depression with anxiety   Recommendations/Plan: Plan is for discharge from CIR today Continue current pain regimen per [facility] Patient has an outpatient Palliative Medicine appointment at the [facility] scheduled for July 05, 2024 for ongoing symptom management Discharge prescription for methadone  sent 12/29 and updated prescription for oxycodone  sent 12/30 Oxycodone  HCl 10mg : Take 2 tablets (total 20mg ) every 4 hours as needed for breakthrough pain. Max 8 tablets per day. Disp 120, R-0. No  further acute PMT needs  Goals of Care and Additional Recommendations: Limitations on Scope of Treatment: Full Scope Treatment  Code Status:    Code Status Orders  (From admission, onward)           Start     Ordered   06/02/24 1758  Full code  Continuous       Question:  By:  Answer:  Consent: discussion documented in EHR   06/02/24 1757           Code Status History     Date Active Date Inactive Code Status Order ID Comments User Context   05/21/2024 0131 06/02/2024 1751 Full Code [id number]  Charlton Evalene RAMAN, MD Inpatient   05/16/2024 1529 05/21/2024 0023 Full Code [id number]  Pegge Toribio PARAS, PA-C Inpatient   05/16/2024 1529 05/16/2024 1529 Full Code [id number]  Pegge Toribio PARAS, PA-C Inpatient   04/28/2024 2016 05/16/2024 1523 Full Code [id number]  Arthea Child, MD Inpatient   03/31/2018 2330 04/02/2018 1721 Full Code [id number]  Alfornia Madison, MD Inpatient   12/02/2014 2154 12/07/2014 0901 Full Code [id number]  Cathryne Sherrell BRAVO, NP Inpatient   12/02/2014 1731 12/02/2014 2154 Full Code [id number]  Vicky Charleston, PA-C ED       Prognosis:  Unable to determine  Discharge Planning: Home with Palliative Services  Care plan was discussed with primary RN, patient, patient's family, pharmacist, pharmacy tech, Dr. Cornelio   Thank you for allowing the Palliative Medicine Team to assist in the care of this patient.   Total Time 60 minutes Prolonged Time Billed  no      Jeoffrey CHRISTELLA Sharps, NP  Please contact Palliative Medicine Team phone at [phone] for questions and concerns.   *Portions of this note  are a verbal dictation therefore any spelling and/or grammatical errors are due to the Dragon Medical One system interpretation.     "

## 2024-06-21 NOTE — Telephone Encounter (Signed)
 Pharmacy Patient Advocate Encounter  Received notification from [facility] MEDICAID that Prior Authorization for Methadone  HCl 10MG  tablets has been APPROVED from 06/21/24 to 06/21/25   PA #/Case ID/Reference #: [id number]

## 2024-06-21 NOTE — Progress Notes (Signed)
 "                                                        PROGRESS NOTE   Subjective/Complaints:  Pt reports throat sore. Having some post nasal drip- has had allergies in past- thought had outgrown them.  Took a total of 50 steps, per pt,  yesterday without RW- 04/01/14/15.    ROS:  Pt denies SOB, abd pain, CP, N/V/C/D, and vision changes    +peripheral edema-improving +sore throat; with post nasal drip (+)   Objective:   No results found.  Recent Labs    06/20/24 0547  WBC 5.4  HGB 10.7*  HCT 33.9*  PLT PLATELET CLUMPS NOTED ON SMEAR, UNABLE TO ESTIMATE      Recent Labs    06/20/24 0547  NA 136  K 4.2  CL 99  CO2 29  GLUCOSE 105*  BUN 15  CREATININE 0.69  CALCIUM 9.2     Intake/Output Summary (Last 24 hours) at 06/21/2024 0830 Last data filed at 06/21/2024 0748 Gross per 24 hour  Intake 716 ml  Output 1100 ml  Net -384 ml        Physical Exam: Vital Signs Blood pressure 122/63, pulse 91, temperature 98.4 F (36.9 C), temperature source Oral, resp. rate 18, height 5' 9 (1.753 m), weight (!) 160.7 kg, SpO2 98%.      General: awake, alert, appropriate, sitting up in bed; NAD HENT: conjugate gaze; oropharynx coated- appears allergic/pale CV: regular rate and rhythm; no JVD Pulmonary: CTA B/L; no W/R/R- good air movement GI: soft, NT, ND, (+)BS- protuberant Psychiatric: appropriate Neurological: Ox3  Extremities:2+ LE edema still   PRIOR EXAMS: Musculoskeletal:     Cervical back: Neck supple.  Neuro:     LUE 5/5 RUE- at least 3/5 proximally; and 5-/5 distally- limited by pain and surgery of proximal humerus RLE- HF 3-/5; KE/KF 4+/5; DF 4/5 and PF 3-/5 LLE- HF 3+/5; KE/KF 5-/5 and DF/PF 5-/5   clonus B/L--no changes No hoffman's MAS of 1 in LE's-  Intact to light touch in all 4 extremities   Assessment/Plan: 1. Functional deficits which require 3+ hours per day of interdisciplinary therapy in a comprehensive inpatient rehab  setting. Physiatrist is providing close team supervision and 24 hour management of active medical problems listed below. Physiatrist and rehab team continue to assess barriers to discharge/monitor patient progress toward functional and medical goals  Care Tool:  Bathing    Body parts bathed by patient: Right arm, Chest, Abdomen, Front perineal area, Right upper leg, Left upper leg, Right lower leg, Left lower leg, Face, Left arm, Buttocks   Body parts bathed by helper: Buttocks     Bathing assist Assist Level: Independent with assistive device     Upper Body Dressing/Undressing Upper body dressing   What is the patient wearing?: Pull over shirt    Upper body assist Assist Level: Independent with assistive device    Lower Body Dressing/Undressing Lower body dressing      What is the patient wearing?: Underwear/pull up, Pants     Lower body assist Assist for lower body dressing: Independent with assitive device Assistive Device Comment: reacher   Toileting Toileting    Toileting assist Assist for toileting: Independent with assistive device     Transfers Chair/bed transfer  Transfers assist     Chair/bed transfer assist level: Independent with assistive device Chair/bed transfer assistive device: Arboriculturist assist      Assist level: Supervision/Verbal cueing Assistive device: Walker-rolling Max distance: 143   Walk 10 feet activity   Assist     Assist level: Supervision/Verbal cueing Assistive device: Walker-rolling   Walk 50 feet activity   Assist Walk 50 feet with 2 turns activity did not occur: Safety/medical concerns (weakness/fatigue/pain)  Assist level: Supervision/Verbal cueing Assistive device: Walker-rolling    Walk 150 feet activity   Assist Walk 150 feet activity did not occur: Safety/medical concerns         Walk 10 feet on uneven surface  activity   Assist Walk 10 feet on uneven  surfaces activity did not occur: Safety/medical concerns (weakness/fatigue/pain)   Assist level: Contact Guard/Touching assist Assistive device: Walker-rolling   Wheelchair     Assist Is the patient using a wheelchair?: Yes Type of Wheelchair: Manual    Wheelchair assist level: Independent Max wheelchair distance: 150    Wheelchair 50 feet with 2 turns activity    Assist        Assist Level: Independent   Wheelchair 150 feet activity     Assist      Assist Level: Independent   Blood pressure 122/63, pulse 91, temperature 98.4 F (36.9 C), temperature source Oral, resp. rate 18, height 5' 9 (1.753 m), weight (!) 160.7 kg, SpO2 98%.   Medical Problem List and Plan: 1. 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  Hx T2 corpectomy with T1-3 decomp/fusion (05/05/24)             -patient may  shower             -ELOS/Goals: ~ 12/30- supervision             Admit to CIR Appropriate for CIR- didn't finish first time due to R humeral pathological fx due to Thyroid  cancer mets   Continue ACE wraps- written for- educated pt and nursing that needs them for swelling. Needs to keep elevating legs  D/c today- going home- getting pain meds from Palliative care at this time- asked pt to make appt with ME as well as NP if cannot see me in the next 1 month.  2.  Antithrombotics: -DVT/anticoagulation:  Mechanical: Sequential compression devices, below knee Bilateral lower extremities Pharmaceutical: Eliquis  5mg  BID due to PE 11/10- RUL segmental PE 12/30- will need Eliquis  for 3-6 months due to SCI and  cancer dx             -antiplatelet therapy: N/A   3. Pain Management: Robaxin  750 TID AC/HS, Lyrica  50 mg TID, Methadone  10 mg TID. Flexeril  10mg  TID, lidoderm  patches. Oxycodone  30 mg q3 hours as needed for breakthrough pain. Palliative care to follow.  12/16- Called Palliative care- will see if can  increase Methadone  with goal to reduce Oxycodone  - see if can move Oxy to q4 hours? con't regimen for now, but called and placed consult 12/18- Methadone  up to 12.5 mg q8 hours and Oxycodone  30 mg q3 hours- 5 dose sin last 24 hours- they also started scheduled Tylenol  and stopped Robaxin - weaning flexeril  12/19- increased methadone  today- on 15 methadone  q8 hours- and Oxy 30 mg q4 hours- to change dosing Monday 12/29- pain controlled on Methadone  20mg  q8Hr and Oxy IR 20mg  QID ( on average)  12/30- pain  controlled on current regimen- palliative care to write for meds 4. Mood/Behavior/Sleep: LCSW to follow for evaluation and support when available.              -antipsychotic agents: n/a   -Trazodone  PRN  5. Neuropsych/cognition: This patient is capable of making decisions on his own behalf.   6. Skin/Wound Care: Routine pressure relief measures. Monitor incisions   7. Fluids/Electrolytes/Nutrition: Monitor I&O and weight. Follow up labs CBC/CMP, continue vitamins/supplements.    8. 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 Completed course of dexamethasone . Patient to follow up outpatient with ENT, endocrinology and radiation oncology. ENT plan to discuss at multidisciplinary tumor board on 06/08/24.             - Will likely need Thyroidectomy and RAI-waiting for tumor board 12/16-12/17 should occur at tumor board 12/17 - waiting to hear from Oncology  12/18- waiting for note form Oncology -will reach out about it -06/12/24 see ENT note from 12/20, plan for likely thyroidectomy, outpatient f/up with PET/etc.  F/U Dr. Reyes Alexander after DC  9. Pathological right proximal humerus fracture: s/p IMR 11/29. WBAT in RUE due to needing it- use platform walker.  Ortho follow up in 2 weeks for recheck and updated xrays.  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 Patient underwent instrumentation and fusion on prior admission. Radiation Onc plans 2 weeks of radiotherapy to thoracic spine starts 06/02/24- right humerus on 06/09/24.    Radiation completed 12/24  11. Hx of RUL segmental  PE: dx on 11/6 provoked secondary to metastatic cancer. Continue Eliquis  for 3-6 months.   12. Incomplete Paraplegia/Neurogenic bladder: Monitor bladder scan with PVR  12/12- will order in/out caths and bladder scans q6 hours- to see if needs cathing -12/13-14/25 no cathing overnight, urinating well   12/16- PVR's 4 to 126 cc in last 24 hours 12/17- Will start Flomax - we both agree he's not emptying all the time-  0.4 mg q supper- hopefully will help frequency  12/26 voiding continently with low PVR's  -06/18/24 still low PVRs, will d/c now, voiding well  13. Chronic normocytic anemia: Stable, continue Niferex 150 mg daily   -12/25 HGB stable 10.4   14. Constipation/mild neurogenic bowel: Continue Miralax   daily and Senokot S 2 tabs BID -12/27 LBM  2d ago, if no BM by tomorrow will do MoM 15ml-- had BM afternoon 12/27, no MoM needed  12/31- LBM 2 days ago per pt- don't see documented.  15. Polysubstance use disorder- will have palliative care f/u with meds after d/c for wean   12/16- called palliative care to help with this  16. LE edema B/L 12/17- will order ACE wraps and d/w pt elevating his legs to level of heart when not in therapy -06/12/24 pt stating a little more swelling, advised use of ACE wraps, but will give one dose of Lasix  20mg  today-- advised that his albumin  is also low so lasix  alone may not help a ton-- encouraged protein intake.  Filed Weights   06/02/24 1700 06/16/24 0719  Weight: (!) 148.7 kg (!) 160.7 kg     -12/26 has received 3 doses of lasix .  -no lasix  for 2 days. Looks and feels about the same but still appears swollen and tight. -weight is up quite a bit    -continue to elevate and wrap. Lyrica  could be contributing   -  will  begin standing lasix  dose of 40mg  daily for now   -check bmet in am 12/27 -06/18/24 BMP fine, cont lasix , but might really benefit from albumin -- at this point basically anasarca-- a little better 12/28  12/30- d/c today-pt advised to keep legs elevated as much as possible- use ACE wraps- is a little better 17.  Morbid obesity  18. Sore throat/allergies?post nasal drip  12/30- added Flonase for pt 2 sprays daily  I spent a total of  35  minutes on total care today- >50% coordination of care- due to  D/w pt at length about d/c, as well as NP and team about d/c plans  The patient is medically ready for discharge to home and will need follow-up with [facility]. In addition, they will need to follow up with their PCP, Oncology, Neurosurgery, and ENT and Endocrinology as well as Palliative care.   LOS: 19 days A FACE TO FACE EVALUATION WAS PERFORMED  Danise Dehne 06/21/2024, 8:30 AM     "

## 2024-06-21 NOTE — Telephone Encounter (Signed)
 Pharmacy Patient Advocate Encounter  Received notification from [facility] MEDICAID that Prior Authorization for oxyCODONE  HCl 10MG  tablets has been APPROVED from 06/21/24 to 06/21/25   PA #/Case ID/Reference #: [id number]

## 2024-06-22 ENCOUNTER — Ambulatory Visit (INDEPENDENT_AMBULATORY_CARE_PROVIDER_SITE_OTHER): Payer: MEDICAID | Admitting: Family

## 2024-06-22 ENCOUNTER — Encounter: Payer: Self-pay | Admitting: Family

## 2024-06-22 VITALS — BP 136/80 | HR 99 | Temp 97.7°F | Resp 20 | Ht 69.0 in | Wt 339.0 lb

## 2024-06-22 DIAGNOSIS — Z13220 Encounter for screening for lipoid disorders: Secondary | ICD-10-CM | POA: Diagnosis not present

## 2024-06-22 DIAGNOSIS — H6123 Impacted cerumen, bilateral: Secondary | ICD-10-CM

## 2024-06-22 DIAGNOSIS — D509 Iron deficiency anemia, unspecified: Secondary | ICD-10-CM | POA: Diagnosis not present

## 2024-06-22 DIAGNOSIS — I272 Pulmonary hypertension, unspecified: Secondary | ICD-10-CM

## 2024-06-22 DIAGNOSIS — E041 Nontoxic single thyroid nodule: Secondary | ICD-10-CM

## 2024-06-22 DIAGNOSIS — Z86711 Personal history of pulmonary embolism: Secondary | ICD-10-CM | POA: Insufficient documentation

## 2024-06-22 DIAGNOSIS — F418 Other specified anxiety disorders: Secondary | ICD-10-CM

## 2024-06-22 DIAGNOSIS — Z1159 Encounter for screening for other viral diseases: Secondary | ICD-10-CM

## 2024-06-22 DIAGNOSIS — J4540 Moderate persistent asthma, uncomplicated: Secondary | ICD-10-CM | POA: Insufficient documentation

## 2024-06-22 DIAGNOSIS — Z7689 Persons encountering health services in other specified circumstances: Secondary | ICD-10-CM

## 2024-06-22 DIAGNOSIS — Z716 Tobacco abuse counseling: Secondary | ICD-10-CM | POA: Diagnosis not present

## 2024-06-22 DIAGNOSIS — Z23 Encounter for immunization: Secondary | ICD-10-CM | POA: Diagnosis not present

## 2024-06-22 MED ORDER — DEBROX 6.5 % OT SOLN: 5.0000 [drp] | Freq: Two times a day (BID) | OTIC | 0 refills | Status: AC

## 2024-06-22 NOTE — Progress Notes (Signed)
 "  Provider: Dhiren Azimi FNP-C   Joseph [name], Joseph BROCKS, NP  Patient Care Team: Maanav Kassabian, Joseph BROCKS, NP as PCP - General (Family Medicine) Dozier Soulier, MD as Consulting Physician (Orthopedic Surgery) Tina Pauletta BROCKS, MD as Consulting Physician (Oncology) Faythe Purchase, MD as Consulting Physician (Endocrinology) [name], MD as Consulting Physician (Physical Medicine and Rehabilitation)  Extended Emergency Contact Information Primary Emergency Contact: Bass,Paula Address: [address]          [place], [place] [address] [place] Home Phone: [phone] Relation: Mother Secondary Emergency Contact: [place] Mobile Phone: [phone] Relation: Father  Code Status:  Full Code Goals of care: Advanced Directive information    06/22/2024    9:19 AM  Advanced Directives  Does Patient Have a Medical Advance Directive? No  Would patient like information on creating a medical advance directive? No - Patient declined     Chief Complaint  Patient presents with   Establish Care    New patient appointment.     History of Present Illness   Joseph [name] is a 43 year old male with metastatic thyroid  cancer who presents to establish care.  He reports being told he has thyroid  cancer that has spread to his right humerus and upper spine. He recently underwent surgery on his right shoulder to remove a tumorous mass that had dislocated and fractured his humerus. A rod and hanging nail were placed during the procedure. He experiences muscle pain and is working on regaining range of motion. He reports he has an upcoming appointment with an ear, nose, and throat specialist regarding thyroid  removal and possible further radiation treatment.  He also had a tumorous mass on his upper spine, leading to loss of motor function and difficulty walking. This was addressed with a spinal fusion from C1 to C3. He experiences soreness in his back at times.  He has a history of  asthma, which he describes as moderate. He uses albuterol  as needed, primarily when feeling wheezy.  He was recently diagnosed with a pulmonary embolism and is currently taking Eliquis  5 mg twice daily. No issues with bleeding are reported.  He has a history of pulmonary hypertension and reports experiencing leg swelling. He takes furosemide  40 mg daily to manage this.  He has a history of depression but is not currently on any antidepressants. He engages in activities such as reading and drawing to help manage his mood.  He has a history of substance use, having stopped cocaine and marijuana use around 2020. He also quit smoking and drinking alcohol recently, having smoked since he was 52 or 43 years old at a rate of half a pack per day.  His current medications include Tylenol , Flexeril  5 mg three times daily as needed, Ensure for low protein intake, guaifenesin  twice daily, an iron  supplement 150 mg daily for anemia, methadone  20 mg every eight hours, oxycodone  20 mg every four hours as needed, potassium 20 mEq daily, Lyrica  50 mg three times daily, and trazodone  50 mg at bedtime. He also uses a lidocaine  patch and Miralax  as needed.   Past Medical History:  Diagnosis Date   Anxiety    Asthma    as a child   Depression    Narcotic abuse (HCC)    Pulmonary nodules 04/29/2024   Past Surgical History:  Procedure Laterality Date   CLOSED REDUCTION FINGER WITH PERCUTANEOUS PINNING Right 06/22/2013   Procedure: RIGHT HAND SMALL FINGER CLOSED MANIPULATION AND PINNING;  Surgeon: Prentice ORN  Shari, MD;  Location: [facility] OR;  Service: Orthopedics;  Laterality: Right;   HUMERUS IM NAIL Right 05/21/2024   Procedure: INSERTION, INTRAMEDULLARY ROD, HUMERUS;  Surgeon: Dozier Soulier, MD;  Location: [facility] OR;  Service: Orthopedics;  Laterality: Right;   I & D EXTREMITY  03/24/2012   Procedure: IRRIGATION AND DEBRIDEMENT EXTREMITY;  Surgeon: Oneil JAYSON Herald, MD;  Location: [facility] OR;  Service: Orthopedics;   Laterality: Right;  Right Forearm   POSTERIOR CERVICAL FUSION/FORAMINOTOMY N/A 05/05/2024   Procedure: POSTERIOR CERVICAL FUSION CERVICAL SEVEN-THORACIC ONE, THORACIC ONE-THORACIC TWO, THORACIC TWO-THORACIC THREE ,FOR RESECTION OF TUMOR THORACIC TWO CORPECTOMY;  Surgeon: Darnella Dorn SAUNDERS, MD;  Location: [facility] OR;  Service: Neurosurgery;  Laterality: N/A;    Allergies[1]  Allergies as of 06/22/2024   No Known Allergies      Medication List        Accurate as of June 22, 2024  2:30 PM. If you have any questions, ask your nurse or doctor.          Acetaminophen  Extra Strength 500 MG Tabs Take 2 tablets (1,000 mg total) by mouth 3 (three) times daily.   albuterol  (2.5 MG/3ML) 0.083% nebulizer solution Commonly known as: PROVENTIL  Take 3 mLs (2.5 mg total) by nebulization every 6 (six) hours as needed for wheezing or shortness of breath.   CertaVite/Antioxidants Tabs Take 1 tablet by mouth daily.   cyclobenzaprine  5 MG tablet Commonly known as: FLEXERIL  Take 1 tablet (5 mg total) by mouth 3 (three) times daily as needed for muscle spasms.   Eliquis  5 MG Tabs tablet Generic drug: apixaban  Take 1 tablet (5 mg total) by mouth 2 (two) times daily.   Ensure Max Protein Liqd Take 11 oz by mouth 2 (two) times daily.   Ferrex 150 150 MG capsule Generic drug: iron  polysaccharides Take 1 capsule (150 mg total) by mouth daily.   fluticasone 50 MCG/ACT nasal spray Commonly known as: FLONASE Place 2 sprays into both nostrils daily.   furosemide  40 MG tablet Commonly known as: LASIX  Take 1 tablet (40 mg total) by mouth daily.   guaiFENesin  600 MG 12 hr tablet Commonly known as: MUCINEX  Take 2 tablets (1,200 mg total) by mouth 2 (two) times daily.   lidocaine  5 % Commonly known as: LIDODERM  Place 2 patches onto the skin daily. Remove & Discard patch within 12 hours or as directed by MD   methadone  10 MG tablet Commonly known as: DOLOPHINE  Take 2 tablets (20 mg total) by  mouth every 8 (eight) hours for 15 days.   naloxone  0.4 MG/ML injection Commonly known as: NARCAN  Place 1 mL (0.4 mg total) into the nose as needed. TO BE USED FOR OPIOID OVERDOSE   Oxycodone  HCl 10 MG Tabs Take 2 tablets (20 mg total) by mouth every 4 (four) hours as needed for up to 15 days (for breakthrough pain. Max 8 tablets per day.).   polyethylene glycol powder 17 GM/SCOOP powder Commonly known as: GLYCOLAX /MIRALAX  Dissolve 1 capful (17g) in 4-8 ounces of liquid and take by mouth daily. What changed:  when to take this reasons to take this   potassium chloride  SA 20 MEQ tablet Commonly known as: KLOR-CON  M Take 1 tablet (20 mEq total) by mouth daily.   pregabalin  50 MG capsule Commonly known as: LYRICA  Take 1 capsule (50 mg total) by mouth 3 (three) times daily.   Stool Softener/Laxative 50-8.6 MG tablet Generic drug: senna-docusate Take 2 tablets by mouth in the morning and at bedtime.  tamsulosin  0.4 MG Caps capsule Commonly known as: FLOMAX  Take 1 capsule (0.4 mg total) by mouth daily after supper.   traZODone  50 MG tablet Commonly known as: DESYREL  Take 1 tablet (50 mg total) by mouth at bedtime as needed for sleep.        Review of Systems  Constitutional:  Negative for appetite change, chills, fatigue, fever and unexpected weight change.  HENT:  Negative for congestion, dental problem, ear discharge, ear pain, facial swelling, hearing loss, nosebleeds, postnasal drip, rhinorrhea, sinus pressure, sinus pain, sneezing, sore throat, tinnitus and trouble swallowing.   Eyes:  Negative for pain, discharge, redness, itching and visual disturbance.  Respiratory:  Negative for cough, chest tightness, shortness of breath and wheezing.   Cardiovascular:  Negative for chest pain, palpitations and leg swelling.  Gastrointestinal:  Negative for abdominal distention, abdominal pain, blood in stool, constipation, diarrhea, nausea and vomiting.  Endocrine: Negative for  cold intolerance, heat intolerance, polydipsia, polyphagia and polyuria.  Genitourinary:  Negative for difficulty urinating, dysuria, flank pain, frequency and urgency.  Musculoskeletal:  Positive for arthralgias, back pain and gait problem. Negative for joint swelling, myalgias, neck pain and neck stiffness.  Skin:  Negative for color change, pallor, rash and wound.  Neurological:  Negative for dizziness, syncope, speech difficulty, weakness, light-headedness, numbness and headaches.  Hematological:  Does not bruise/bleed easily.  Psychiatric/Behavioral:  Negative for agitation, behavioral problems, confusion, hallucinations, self-injury, sleep disturbance and suicidal ideas. The patient is not nervous/anxious.     Immunization History  Administered Date(s) Administered   Influenza Split 03/25/2012   Influenza, Seasonal, Injecte, Preservative Fre 06/22/2024   PFIZER(Purple Top)SARS-COV-2 Vaccination 09/22/2019, 10/17/2019   Pneumococcal Polysaccharide-23 03/25/2012   Tdap 06/22/2024   Pertinent  Health Maintenance Due  Topic Date Due   Influenza Vaccine  Completed      04/02/2018   11:00 AM 08/16/2019    5:19 PM 05/05/2020    7:10 PM 06/27/2020    1:59 PM 06/22/2024    9:17 AM  Fall Risk  Falls in the past year?     0  Was there an injury with Fall?     0  Fall Risk Category Calculator     0  (RETIRED) Patient Fall Risk Level Low fall risk   Low fall risk  Low fall risk    Patient at Risk for Falls Due to     No Fall Risks  Fall risk Follow up     Falls evaluation completed     Information is confidential and restricted. Go to Review Flowsheets to unlock data.   Data saved with a previous flowsheet row definition   Functional Status Survey:    Vitals:   06/22/24 0923  BP: 136/80  Pulse: 99  Resp: 20  Temp: 97.7 F (36.5 C)  SpO2: 97%  Weight: (!) 339 lb (153.8 kg)  Height: 5' 9 (1.753 m)   Body mass index is 50.06 kg/m. Physical Exam  GENERAL: Alert,  cooperative, well developed, no acute distress. HEENT: Normocephalic, normal oropharynx, moist mucous membranes. Right and left ear canals obstructed with cerumen, tympanic membranes not visible. Nasal septum perforation noted. No sinus tenderness. CHEST: Clear to auscultation bilaterally. No wheezes, rhonchi, or crackles. CARDIOVASCULAR: Normal heart rate and rhythm, S1 and S2 normal without murmurs. ABDOMEN: Soft, non-tender, non-distended, without organomegaly. Normal bowel sounds. EXTREMITIES: No cyanosis or significant edema. MUSCULOSKELETAL: Cervical spine fusion scar well-healed. Right shoulder incision well-healed. Ambulates with a rolling walker  NEUROLOGICAL: Cranial nerves grossly  intact, moves all extremities without gross motor or sensory deficit.  SKIN: No rash,no lesion or erythema   PSYCHIATRY/BEHAVIORAL: Mood stable    Labs reviewed: Recent Labs    05/23/24 0552 05/24/24 0435 05/25/24 0425 06/02/24 0404 06/16/24 0543 06/18/24 0616 06/20/24 0547  NA 137 137 139   < > 137 135 136  K 4.3 4.2 4.0   < > 4.1 4.2 4.2  CL 100 102 100   < > 101 98 99  CO2 30 27 27    < > 28 28 29   GLUCOSE 111* 113* 114*   < > 152* 94 105*  BUN 12 12 9    < > 12 12 15   CREATININE 0.71 0.74 0.67   < > 0.67 0.69 0.69  CALCIUM 8.5* 8.4* 8.7*   < > 8.9 9.3 9.2  MG 1.8 1.7 1.7  --   --   --   --   PHOS 4.9* 4.6 4.8*  --   --   --   --    < > = values in this interval not displayed.   Recent Labs    05/24/24 0435 05/25/24 0425 06/03/24 0537  AST 22 19 23   ALT 36 36 44  ALKPHOS 93 95 98  BILITOT 0.4 0.4 0.4  PROT 6.4* 6.4* 6.2*  ALBUMIN  2.2* 2.3* 2.3*   Recent Labs    05/24/24 0435 05/25/24 0425 06/02/24 0404 06/03/24 0537 06/06/24 0559 06/13/24 0438 06/16/24 0543 06/20/24 0547  WBC 7.7 8.0   < > 7.7   < > 5.1 4.9 5.4  NEUTROABS 4.9 5.4  --  4.7  --   --   --   --   HGB 9.1* 9.6*   < > 9.7*   < > 10.3* 10.2* 10.7*  HCT 28.9* 29.5*   < > 30.9*   < > 32.2* 33.2* 33.9*  MCV 91.7  91.3   < > 93.1   < > 92.8 93.5 92.4  PLT PLATELET CLUMPS NOTED ON SMEAR, UNABLE TO ESTIMATE PLATELET CLUMPS NOTED ON SMEAR, UNABLE TO ESTIMATE   < > PLATELET CLUMPS NOTED ON SMEAR, UNABLE TO ESTIMATE   < > PLATELET CLUMPS NOTED ON SMEAR, UNABLE TO ESTIMATE PLATELET CLUMPS NOTED ON SMEAR, UNABLE TO ESTIMATE PLATELET CLUMPS NOTED ON SMEAR, UNABLE TO ESTIMATE   < > = values in this interval not displayed.   Lab Results  Component Value Date   TSH 3.700 04/30/2024   No results found for: HGBA1C No results found for: CHOL, HDL, LDLCALC, LDLDIRECT, TRIG, CHOLHDL  Significant Diagnostic Results in last 30 days:  DG Humerus Right Result Date: 06/07/2024 CLINICAL DATA:  Humeral fracture, postop. EXAM: RIGHT HUMERUS - 2+ VIEW COMPARISON:  Preoperative imaging FINDINGS: Intramedullary nail with proximal and distal locking screw fixation traverse pathologic fracture of the proximal humerus. Stable fracture alignment. No new periprosthetic lucency. Proximal and distal skin staples in place. Multiple small linear densities in the soft tissues adjacent to the distal humerus may represent needle fragments, present on prior exam. IMPRESSION: ORIF of pathologic proximal humerus fracture. No hardware complication. Electronically Signed   By: Andrea Gasman M.D.   On: 06/07/2024 13:33   CT SOFT TISSUE NECK W CONTRAST Result Date: 05/26/2024 EXAM: CT NECK WITH CONTRAST 05/25/2024 03:14:00 PM TECHNIQUE: CT of the neck was performed with the administration of 75 mL of iohexol  (OMNIPAQUE ) 350 MG/ML injection. Multiplanar reformatted images are provided for review. Automated exposure control, iterative reconstruction, and/or weight based adjustment of the  mA/kV was utilized to reduce the radiation dose to as low as reasonably achievable. COMPARISON: None available. CLINICAL HISTORY: Thyroid  cancer. FINDINGS: AERODIGESTIVE TRACT: No discrete mass. No edema. SALIVARY GLANDS: The parotid and submandibular  glands are unremarkable. THYROID : Markedly enlarged left lobe of the thyroid  gland, which has been previously sampled by FNA. LYMPH NODES: No suspicious cervical lymphadenopathy. SOFT TISSUES: No mass or fluid collection. BRAIN, ORBITS, SINUSES AND MASTOIDS: No acute abnormality. LUNGS AND MEDIASTINUM: No acute abnormality. BONES: Posterior fusion hardware of the upper thoracic spine. There is a lucent lesion within the C6 vertebra replacing most of the normal bone marrow. Smaller lesion noted within the dorsal C5 vertebral body. No current evidence of pathologic fracture. IMPRESSION: 1. Markedly enlarged left thyroid  lobe, previously sampled by FNA. 2. C5 and C6 vertebral body metastases without pathologic fracture. 3. No cervical lymphadenopathy. 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 Recent shoulder surgery for a tumorous mass on the humerus. Scheduled for thyroidectomy and potential radiation therapy. Under endocrinologist care. - Continue follow-up with endocrinologist for thyroidectomy and radiation therapy - Continue follow-up with ENT for thyroid  management  History of Pulmonary embolism Recent diagnosis of pulmonary embolism, currently on Eliquis  5 mg twice daily. No bleeding issues reported. - Continue Eliquis  5 mg twice daily  Pulmonary hypertension Likely secondary to pulmonary embolism and leg swelling. No shortness of breath or edema on exam  - Continue furosemide  40 mg daily for fluid management  Moderate persistent asthma Asthma is moderate in severity, managed with albuterol  as needed. 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 Regular follow-up with pain management every three days. - Continue  methadone  20 mg every 8 hours - Continue oxycodone  20 mg every 4 hours as needed - Continue follow-up with pain management every three days  Anemia Managed with iron  supplementation. - Continue iron  150 mg daily  Depression with anxiety Depression with anxiety, currently not on medication. Engages in activities like reading and drawing to manage mood. - Monitor for worsening depression and consider counseling if needed  Thyroid  nodule Enlarged thyroid  nodule, scheduled for removal. Under endocrinologist care. - Continue follow-up with endocrinologist for thyroid  nodule management  Cerumen impaction, bilateral Bilateral cerumen impaction obstructing view of eardrums. - Use ear drops to soften wax - Will schedule ear lavage in one week   Nasal septal perforation Likely due to past cocaine use.  General health maintenance Routine health maintenance discussed, including lab work and screenings. - Ordered chemistry panel to check kidney, liver, and electrolytes - Ordered CBC to check for anemia and signs of infection - Screened for hepatitis C - Screened for high cholesterol - Scheduled six-month follow-up appointment          Family/ staff Communication: Reviewed plan of care with patient  Labs/tests ordered:  - CBC with Differential/Platelet - CMP with eGFR([facility]) - TSH - Lipid panel - Hep C Antibody  Next Appointment : Return in about 6 months (around 12/20/2024) for medical mangement of chronic issues.SABRA   Spent 30 minutes of Face to face and non-face to face with patient  >50% time spent counseling; reviewing medical record; tests; labs; documentation and developing future plan of care.   Joseph JAYSON Plough, NP      [  1] No Known Allergies  "

## 2024-06-22 NOTE — Patient Instructions (Signed)
-   Instill debrox 6.5 otic solution 5 drops into each ear twice daily x 4 days then follow up for ear lavage.May apply cotton ball at bedtime to prevent drainage to pillow.

## 2024-06-28 ENCOUNTER — Ambulatory Visit: Payer: Self-pay | Admitting: Family

## 2024-06-28 DIAGNOSIS — B1920 Unspecified viral hepatitis C without hepatic coma: Secondary | ICD-10-CM

## 2024-06-28 DIAGNOSIS — R748 Abnormal levels of other serum enzymes: Secondary | ICD-10-CM

## 2024-06-28 NOTE — Progress Notes (Signed)
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      Indication for treatment:  palliative       Radiation treatment dates:   06/02/24-06/15/24  Site/planned dose:   The T2 site was treated to 30 Gy in 10 fractions and the right humerus was also treated to 20 Gy in 5 fractions  Narrative: The patient will receive a call in about one month from the radiation oncology department. He will continue follow up with Dr. Tina as well.      Donald KYM Husband, PAC  "

## 2024-06-29 LAB — COMPLETE METABOLIC PANEL WITHOUT GFR
AG Ratio: 1 (calc) (ref 1.0–2.5)
ALT: 118 U/L — ABNORMAL HIGH (ref 9–46)
AST: 55 U/L — ABNORMAL HIGH (ref 10–40)
Albumin: 3.6 g/dL (ref 3.6–5.1)
Alkaline phosphatase (APISO): 105 U/L (ref 36–130)
BUN: 15 mg/dL (ref 7–25)
CO2: 27 mmol/L (ref 20–32)
Calcium: 9 mg/dL (ref 8.6–10.3)
Chloride: 99 mmol/L (ref 98–110)
Creat: 0.74 mg/dL (ref 0.60–1.29)
Globulin: 3.6 g/dL (ref 1.9–3.7)
Glucose, Bld: 111 mg/dL (ref 65–139)
Potassium: 4.2 mmol/L (ref 3.5–5.3)
Sodium: 136 mmol/L (ref 135–146)
Total Bilirubin: 0.5 mg/dL (ref 0.2–1.2)
Total Protein: 7.2 g/dL (ref 6.1–8.1)

## 2024-06-29 LAB — CBC WITH DIFFERENTIAL/PLATELET
Absolute Lymphocytes: 1106 {cells}/uL (ref 850–3900)
Absolute Monocytes: 855 {cells}/uL (ref 200–950)
Basophils Absolute: 29 {cells}/uL (ref 0–200)
Basophils Relative: 0.5 %
Eosinophils Absolute: 239 {cells}/uL (ref 15–500)
Eosinophils Relative: 4.2 %
HCT: 35.7 % — ABNORMAL LOW (ref 39.4–51.1)
Hemoglobin: 11.5 g/dL — ABNORMAL LOW (ref 13.2–17.1)
MCH: 28.9 pg (ref 27.0–33.0)
MCHC: 32.2 g/dL (ref 31.6–35.4)
MCV: 89.7 fL (ref 81.4–101.7)
MPV: 10.9 fL (ref 7.5–12.5)
Monocytes Relative: 15 %
Neutro Abs: 3471 {cells}/uL (ref 1500–7800)
Neutrophils Relative %: 60.9 %
Platelets: 25 Thousand/uL — ABNORMAL LOW (ref 140–400)
RBC: 3.98 Million/uL — ABNORMAL LOW (ref 4.20–5.80)
RDW: 13.1 % (ref 11.0–15.0)
Total Lymphocyte: 19.4 %
WBC: 5.7 Thousand/uL (ref 3.8–10.8)

## 2024-06-29 LAB — HEPATITIS C ANTIBODY: Hepatitis C Ab: REACTIVE — AB

## 2024-06-29 LAB — LIPID PANEL
Cholesterol: 94 mg/dL
HDL: 37 mg/dL — ABNORMAL LOW
LDL Cholesterol (Calc): 39 mg/dL
Non-HDL Cholesterol (Calc): 57 mg/dL
Total CHOL/HDL Ratio: 2.5 (calc)
Triglycerides: 98 mg/dL

## 2024-06-29 LAB — HCV RNA,QUANTITATIVE REAL TIME PCR
HCV Quantitative Log: <1.18 {Log_IU}/mL
HCV RNA, PCR, QN: <15 [IU]/mL

## 2024-07-01 ENCOUNTER — Other Ambulatory Visit (HOSPITAL_COMMUNITY): Payer: Self-pay

## 2024-07-01 ENCOUNTER — Telehealth (INDEPENDENT_AMBULATORY_CARE_PROVIDER_SITE_OTHER): Payer: Self-pay

## 2024-07-01 ENCOUNTER — Other Ambulatory Visit: Payer: Self-pay

## 2024-07-01 NOTE — Telephone Encounter (Signed)
 Left message for patient to call back to schedule follow-up with Dr. Anice.

## 2024-07-04 ENCOUNTER — Other Ambulatory Visit (HOSPITAL_COMMUNITY): Payer: Self-pay

## 2024-07-04 MED ORDER — CYCLOBENZAPRINE HCL 5 MG PO TABS
5.0000 mg | ORAL_TABLET | Freq: Three times a day (TID) | ORAL | 4 refills | Status: AC | PRN
  Filled 2024-07-04: qty 90, 30d supply, fill #0

## 2024-07-05 ENCOUNTER — Other Ambulatory Visit (HOSPITAL_COMMUNITY): Payer: Self-pay

## 2024-07-05 ENCOUNTER — Encounter: Payer: Self-pay | Admitting: Nurse Practitioner

## 2024-07-05 ENCOUNTER — Inpatient Hospital Stay: Payer: MEDICAID | Admitting: Nurse Practitioner

## 2024-07-05 VITALS — BP 127/81 | HR 99 | Temp 97.3°F | Resp 20 | Wt 325.5 lb

## 2024-07-05 DIAGNOSIS — C73 Malignant neoplasm of thyroid gland: Secondary | ICD-10-CM | POA: Diagnosis not present

## 2024-07-05 DIAGNOSIS — G893 Neoplasm related pain (acute) (chronic): Secondary | ICD-10-CM | POA: Diagnosis not present

## 2024-07-05 DIAGNOSIS — C7951 Secondary malignant neoplasm of bone: Secondary | ICD-10-CM

## 2024-07-05 DIAGNOSIS — F17210 Nicotine dependence, cigarettes, uncomplicated: Secondary | ICD-10-CM

## 2024-07-05 DIAGNOSIS — R530 Neoplastic (malignant) related fatigue: Secondary | ICD-10-CM | POA: Diagnosis not present

## 2024-07-05 DIAGNOSIS — Z7189 Other specified counseling: Secondary | ICD-10-CM

## 2024-07-05 DIAGNOSIS — M792 Neuralgia and neuritis, unspecified: Secondary | ICD-10-CM

## 2024-07-05 DIAGNOSIS — Z515 Encounter for palliative care: Secondary | ICD-10-CM | POA: Diagnosis not present

## 2024-07-05 MED ORDER — METHADONE HCL 10 MG PO TABS
20.0000 mg | ORAL_TABLET | Freq: Three times a day (TID) | ORAL | 0 refills | Status: AC
  Filled 2024-07-05: qty 180, 30d supply, fill #0
  Filled 2024-07-06: qty 120, 20d supply, fill #0
  Filled 2024-07-06: qty 60, 10d supply, fill #0

## 2024-07-05 MED ORDER — OXYCODONE HCL 10 MG PO TABS
20.0000 mg | ORAL_TABLET | ORAL | 0 refills | Status: DC | PRN
  Filled 2024-07-05: qty 120, 15d supply, fill #0

## 2024-07-05 NOTE — Progress Notes (Addendum)
 "    Palliative Medicine [facility]  [phone]) [phone] [phone]) [phone]   Name: Joseph [name] Date: 07/05/2024 MRN: [id number]  DOB: [birth year]  Patient Care Team: Joseph Roxan BROCKS, NP as PCP - General (Family Medicine) Joseph Soulier, MD as Consulting Physician (Orthopedic Surgery) Joseph Pauletta BROCKS, MD as Consulting Physician (Oncology) Joseph Purchase, MD as Consulting Physician (Endocrinology) Joseph Bouchard, MD as Consulting Physician (Physical Medicine and Rehabilitation)    REASON FOR CONSULTATION: Joseph [name] is a 44 y.o. male with oncologic medical history including metastatic papillary thyroid  cancer, asthma, anxiety, depression, and history of polysubstance abuse.  Palliative is seeing patient for symptom management and goals of care.    SOCIAL HISTORY:     reports that he has been smoking cigarettes. He has a 10 pack-year smoking history. He has never used smokeless tobacco. He reports that he does not currently use alcohol. He reports current drug use. Drug: Marijuana.  ADVANCE DIRECTIVES:  None on file.  Patient interested in completing.  States his mother Joseph [name] and father Joseph [name] would be his documented medical decision makers.  CODE STATUS: Full code  PAST MEDICAL HISTORY: Past Medical History:  Diagnosis Date   Anemia    Anxiety    Asthma    as a child   Depression    Narcotic abuse (HCC)    Nasal septal perforation    Pulmonary nodules 04/29/2024    PAST SURGICAL HISTORY:  Past Surgical History:  Procedure Laterality Date   CLOSED REDUCTION FINGER WITH PERCUTANEOUS PINNING Right 06/22/2013   Procedure: RIGHT HAND SMALL FINGER CLOSED MANIPULATION AND PINNING;  Surgeon: Joseph LELON Pagan, MD;  Location: [facility] OR;  Service: Orthopedics;  Laterality: Right;   HUMERUS IM NAIL Right 05/21/2024   Procedure: INSERTION, INTRAMEDULLARY ROD, HUMERUS;  Surgeon: Joseph Soulier, MD;  Location: [facility] OR;  Service: Orthopedics;   Laterality: Right;   I & D EXTREMITY  03/24/2012   Procedure: IRRIGATION AND DEBRIDEMENT EXTREMITY;  Surgeon: Joseph [name] Herald, MD;  Location: [facility] OR;  Service: Orthopedics;  Laterality: Right;  Right Forearm   POSTERIOR CERVICAL FUSION/FORAMINOTOMY N/A 05/05/2024   Procedure: POSTERIOR CERVICAL FUSION CERVICAL SEVEN-THORACIC ONE, THORACIC ONE-THORACIC TWO, THORACIC TWO-THORACIC THREE ,FOR RESECTION OF TUMOR THORACIC TWO CORPECTOMY;  Surgeon: Joseph Dorn SAUNDERS, MD;  Location: [facility] OR;  Service: Neurosurgery;  Laterality: N/A;    HEMATOLOGY/ONCOLOGY HISTORY:  Oncology History   No problem history exists.    ALLERGIES:  has no known allergies.  MEDICATIONS:  Current Outpatient Medications  Medication Sig Dispense Refill   acetaminophen  (TYLENOL ) 500 MG tablet Take 2 tablets (1,000 mg total) by mouth 3 (three) times daily. 30 tablet 0   albuterol  (PROVENTIL ) (2.5 MG/3ML) 0.083% nebulizer solution Take 3 mLs (2.5 mg total) by nebulization every 6 (six) hours as needed for wheezing or shortness of breath. 90 mL 12   apixaban  (ELIQUIS ) 5 MG TABS tablet Take 1 tablet (5 mg total) by mouth 2 (two) times daily. 60 tablet 0   cyclobenzaprine  (FLEXERIL ) 5 MG tablet Take 1 tablet (5 mg total) by mouth 3 (three) times daily as needed. 90 tablet 4   Ensure Max Protein (ENSURE MAX PROTEIN) LIQD Take 11 oz by mouth 2 (two) times daily.     fluticasone  (FLONASE ) 50 MCG/ACT nasal spray Place 2 sprays into both nostrils daily. 16 g 0   furosemide  (LASIX ) 40 MG tablet Take 1 tablet (40 mg total) by mouth daily. 30 tablet 0   guaiFENesin  (  MUCINEX ) 600 MG 12 hr tablet Take 2 tablets (1,200 mg total) by mouth 2 (two) times daily.     iron  polysaccharides (NIFEREX) 150 MG capsule Take 1 capsule (150 mg total) by mouth daily. 30 capsule 0   lidocaine  (LIDODERM ) 5 % Place 2 patches onto the skin daily. Remove & Discard patch within 12 hours or as directed by MD 30 patch 0   methadone  (DOLOPHINE ) 10 MG tablet Take 2 tablets  (20 mg total) by mouth every 8 (eight) hours. 180 tablet 0   Multiple Vitamin (MULTIVITAMIN WITH MINERALS) TABS tablet Take 1 tablet by mouth daily. 100 tablet 0   naloxone  (NARCAN ) 0.4 MG/ML injection Place 1 mL (0.4 mg total) into the nose as needed. TO BE USED FOR OPIOID OVERDOSE 2 mL 0   Oxycodone  HCl 10 MG TABS Take 2 tablets (20 mg total) by mouth every 4 (four) hours as needed for up to 15 days (for breakthrough pain. Max 8 tablets per day.). 120 tablet 0   polyethylene glycol powder (GLYCOLAX /MIRALAX ) 17 GM/SCOOP powder Dissolve 1 capful (17g) in 4-8 ounces of liquid and take by mouth daily. (Patient taking differently: Take 17 g by mouth as needed.) 238 g 0   potassium chloride  SA (KLOR-CON  M) 20 MEQ tablet Take 1 tablet (20 mEq total) by mouth daily. 30 tablet 0   pregabalin  (LYRICA ) 50 MG capsule Take 1 capsule (50 mg total) by mouth 3 (three) times daily. 90 capsule 0   sennosides-docusate sodium  (SENOKOT-S) 8.6-50 MG tablet Take 2 tablets by mouth in the morning and at bedtime. 120 tablet 0   tamsulosin  (FLOMAX ) 0.4 MG CAPS capsule Take 1 capsule (0.4 mg total) by mouth daily after supper. 30 capsule 0   traZODone  (DESYREL ) 50 MG tablet Take 1 tablet (50 mg total) by mouth at bedtime as needed for sleep. 30 tablet 0   No current facility-administered medications for this visit.    VITAL SIGNS: BP 127/81   Pulse 99   Temp (!) 97.3 F (36.3 C)   Resp 20   Wt (!) 325 lb 8 oz (147.6 kg)   SpO2 96%   BMI 48.07 kg/m  Filed Weights   07/05/24 1137  Weight: (!) 325 lb 8 oz (147.6 kg)    Estimated body mass index is 48.07 kg/m as calculated from the following:   Height as of 06/22/24: 5' 9 (1.753 m).   Weight as of this encounter: 325 lb 8 oz (147.6 kg).  LABS: CBC:    Component Value Date/Time   WBC 5.7 06/22/2024 1020   HGB 11.5 (L) 06/22/2024 1020   HCT 35.7 (L) 06/22/2024 1020   PLT 25 (L) 06/22/2024 1020   MCV 89.7 06/22/2024 1020   NEUTROABS 3,471 06/22/2024 1020    LYMPHSABS 1.5 06/03/2024 0537   MONOABS 0.9 06/03/2024 0537   EOSABS 239 06/22/2024 1020   BASOSABS 29 06/22/2024 1020   Comprehensive Metabolic Panel:    Component Value Date/Time   NA 136 06/22/2024 1020   K 4.2 06/22/2024 1020   CL 99 06/22/2024 1020   CO2 27 06/22/2024 1020   BUN 15 06/22/2024 1020   CREATININE 0.74 06/22/2024 1020   GLUCOSE 111 06/22/2024 1020   CALCIUM 9.0 06/22/2024 1020   AST 55 (H) 06/22/2024 1020   ALT 118 (H) 06/22/2024 1020   ALKPHOS 98 06/03/2024 0537   BILITOT 0.5 06/22/2024 1020   PROT 7.2 06/22/2024 1020   ALBUMIN  2.3 (L) 06/03/2024 9462  RADIOGRAPHIC STUDIES: DG Humerus Right Result Date: 06/07/2024 CLINICAL DATA:  Humeral fracture, postop. EXAM: RIGHT HUMERUS - 2+ VIEW COMPARISON:  Preoperative imaging FINDINGS: Intramedullary nail with proximal and distal locking screw fixation traverse pathologic fracture of the proximal humerus. Stable fracture alignment. No new periprosthetic lucency. Proximal and distal skin staples in place. Multiple small linear densities in the soft tissues adjacent to the distal humerus may represent needle fragments, present on prior exam. IMPRESSION: ORIF of pathologic proximal humerus fracture. No hardware complication. Electronically Signed   By: Joseph [name] M.D.   On: 06/07/2024 13:33    PERFORMANCE STATUS (ECOG) : 2 - Symptomatic, <50% confined to bed  Review of Systems  Constitutional:  Positive for activity change and fatigue.  Musculoskeletal:  Positive for arthralgias and back pain.  Unless otherwise noted, a complete review of systems is negative.  Physical Exam General: NAD, ambulatory with a walker Cardiovascular: regular rate and rhythm Pulmonary: clear ant fields Extremities: no edema, no joint deformities Skin: no rashes Neurological: Alert and oriented x3  Discussed the use of AI scribe software for clinical note transcription with the patient, who gave verbal consent to  proceed.  History of Present Illness ANTONYO [name] is a 44 year old male with metastatic papillary cancer who presents to clinic for his initial outpatient palliative visit s/p recent hospitalization. He is accompanied by his father, Joseph [name].  Patient is ambulatory with a walker.  He is alert and able to engage appropriately in discussions.  I introduced myself, Maygan RN, and Palliative's role in collaboration with the oncology team. Concept of Palliative Care was introduced as specialized medical care for people and their families living with serious illness.  It focuses on providing relief from the symptoms and stress of a serious illness.  The goal is to improve quality of life for both the patient and the family. Values and goals of care important to patient and family were attempted to be elicited.  Mr. Turko currently lives with his aunt and has a daughter. He previously worked in delivery and shipping/receiving jobs. His parents are his primary support.  Since being discharged from the hospital, he has been moving around better and is able to walk longer distances, sometimes without the aid of a walker. He practices walking inside his home, which has a circular layout, to improve his mobility.  Patient spent time in CIR prior to discharge.   He takes trazodone  50 mg at bedtime to aid with sleep. No issues with appetite or bowel movements, denying constipation or diarrhea.  We discussed his pain at length. Cailan experiences pain primarily in his back, describing it as sore and achy. The pain extends from the upper back to the lower back, particularly in the curve at the bottom. He describes the lower back pain as sore and the upper back pain as a 'nagging pain' that can be sharp if he moves the wrong way. The pain is constant and causes discomfort.  He is currently taking several medications for pain management, including methadone  20 mg every eight hours, oxycodone  20 mg every  four hours as needed, Lyrica  (pregabalin ) 50 mg three times a day, and cyclobenzaprine  (Flexeril ) 5 mg three times a day. Lidocaine  patches have not been effective for his pain. Patient reports current regimen is effective.   Complete physical, medication, medical, and psychosocial review completed.  Patient denies any use of illicit drugs or alcohol. Understands if ever positive this would be grounds for no longer  receiving any further opioids. Extensive discussions and explanation of palliative's role in collaboration with his oncology team to assist in his pain and symptom management. Education provided on pain contract and guidelines for ongoing support including terms for dismissal if contract is broken. Patient verbalized understanding. Pain contract completed.    Goals of Care We discussed Mr. Honea's current illness and what it means in the larger context of his on-going co-morbidities. Natural disease trajectory and expectations were discussed.  Mr. Berrocal is clear in his expressed his goals are to treat the treatable allow him every opportunity to continue to thrive.  I approached discussions regarding advanced directives and CODE STATUS.  Patient remains a full code.  States he does not have a documented advanced directive however is interested in completing.  Education provided on healthcare power of attorney and living will.  Documents provided as requested and patient will be arranged to complete documents as desired in the advanced directive clinic.  Mr. Tsan states in the event he is unable to speak for himself his wishes are for his parents Ms. Joseph [name] and his father Labaron Digirolamo to be his medical decision-maker.  Education also provided per [place]  hierarchy what steps will be taken in the absence of such documents.  They verbalized understanding and appreciation.  I discussed the importance of continued conversation with family and their medical providers regarding overall  plan of care and treatment options, ensuring decisions are within the context of the patients values and GOCs. Assessment & Plan Established therapeutic relationship. Education provided on palliative's role in collaboration with their Oncology/Radiation team.  Neoplasm related pain Chronic pain primarily in the upper and lower back, described as sore and achy with sharp pain upon certain movements. Pain is constant and discomforting. Current pain management includes methadone , oxycodone , Lyrica , and cyclobenzaprine . Lidocaine  patches are ineffective. Pain may have worsened since hospital discharge due to increased activity and environmental changes. - Refilled methadone  20 mg every 8 hours, now a monthly supply. - Refilled oxycodone  20 mg every 4 hours as needed, 15-day supply. - Continue Lyrica  50 mg three times a day. - Continue cyclobenzaprine  5 mg three times a day. - Educated on pain contract and medication management policies, grounds for dismissal, itwo-day notice for refills and communication for dosage adjustments. - Discussed potential for increased activity and environmental changes affecting pain levels. - Pain contract completed.  Patient provided with copy  Palliative care management Focus on maintaining quality of life and pain control. Emphasis on responsible medication use and communication for adjustments. No use of street drugs or alcohol reported. Discussed the importance of healthcare power of attorney and living will documents for future medical decision-making. - Provided healthcare power of attorney and living will documents for completion. - Ensured understanding of medication management policies and pain contract. - Scheduled follow-up appointments every 4-6 weeks. - Advised on safe medication storage and use, especially in households with children.  Patient expressed understanding and was in agreement with this plan. He also understands that He can call the clinic at  any time with any questions, concerns, or complaints.   Thank you for your referral and allowing Palliative to assist in Mr.SABRA Charmaine RAMAN Moultry's care.   Number and complexity of problems addressed: HIGH - 1 or more chronic illnesses with SEVERE exacerbation, progression, or side effects of treatment - advanced cancer, pain. Any controlled substances utilized were prescribed in the context of palliative care.  I personally spent a total of 75 minutes  in the care of the patient today including preparing to see the patient, getting/reviewing separately obtained history, performing a medically appropriate exam/evaluation, counseling and educating, placing orders, documenting clinical information in the EHR, independently interpreting results, communicating results, and coordinating care. Visit consisted of counseling and education dealing with the complex and emotionally intense issues of symptom management and palliative care in the setting of serious and potentially life-threatening illness.  Signed by: Levon Borer, AGPCNP-BC Palliative Medicine Team/[facility]      "

## 2024-07-06 ENCOUNTER — Encounter: Payer: Self-pay | Admitting: Family

## 2024-07-06 ENCOUNTER — Telehealth: Payer: Self-pay

## 2024-07-06 ENCOUNTER — Ambulatory Visit: Payer: MEDICAID | Admitting: Family

## 2024-07-06 ENCOUNTER — Other Ambulatory Visit (HOSPITAL_COMMUNITY): Payer: Self-pay

## 2024-07-06 ENCOUNTER — Other Ambulatory Visit: Payer: Self-pay

## 2024-07-06 VITALS — BP 132/84 | HR 99 | Temp 97.9°F | Ht 69.0 in | Wt 327.0 lb

## 2024-07-06 DIAGNOSIS — R490 Dysphonia: Secondary | ICD-10-CM | POA: Diagnosis not present

## 2024-07-06 LAB — POCT RAPID STREP A (OFFICE): Rapid Strep A Screen: NEGATIVE

## 2024-07-06 NOTE — Patient Instructions (Signed)
-   Advised to gurgle throat with warm water  and salt at least once daily  - take OTC tylenol  as needed for pain  - Encouraged to increase fluid intake/warm tea or soup   - Notify provider if symptoms worsen or fail to improve

## 2024-07-06 NOTE — Telephone Encounter (Signed)
 [facility] Clinical Social Work  Clinical Social Work was referred by Palliative Care for advance directives questions.  Clinical Social Worker contacted patient by phone to offer support and assess for needs.  CSW provided education on process for completing AD at [place]. Patient scheduled for AD clinic on 2/09. Provided direct contact for admin if changes are needed.  Lizbeth Sprague, LCSW  Clinical Social Worker [facility]

## 2024-07-11 NOTE — Progress Notes (Signed)
" °  Radiation Oncology         [phone] ________________________________  Name: Joseph [name] MRN: [id number]  Date of Service: 07/18/2024  DOB: 04/27/1981  Post Treatment Telephone Note  Diagnosis:  Metastatic Papillary Carcinoma of the thyroid  involving the spine and right humerus.      Radiation treatment dates:  06/02/24-06/15/24   Site/planned dose:   The T2 site was treated to 30 Gy in 10 fractions and the right humerus was also treated to 20 Gy in 5 fractions   The patient was available for call today.   The patient did note fatigue during radiation but it has gotten better. The patient did not note skin changes in the field of radiation during therapy. The patient has noticed improvement in pain in the area(s) treated with radiation. The patient is not taking dexamethasone . The patient does not have symptoms of  weakness or loss of control of the extremities. The patient does not have symptoms of headache. The patient does not have symptoms of seizure or uncontrolled movement. The patient does not have symptoms of changes in vision. The patient does not have changes in speech. The patient does not have confusion.   The patient is scheduled for ongoing care with Dr. Tina in medical oncology. The patient was encouraged to call if he develops concerns or questions regarding radiation.   "

## 2024-07-11 NOTE — Progress Notes (Deleted)
 "  Subjective:    Patient ID: Joseph [name], male    DOB: 1981-01-08, 44 y.o.   MRN: [id number]  HPI   Pain Inventory Average Pain {NUMBERS; 0-10:5044} Pain Right Now {NUMBERS; 0-10:5044} My pain is {PAIN DESCRIPTION:21022940}  In the last 24 hours, has pain interfered with the following? General activity {NUMBERS; 0-10:5044} Relation with others {NUMBERS; 0-10:5044} Enjoyment of life {NUMBERS; 0-10:5044} What TIME of day is your pain at its worst? {time of day:24191} Sleep (in general) {BHH GOOD/FAIR/POOR:22877}  Pain is worse with: {ACTIVITIES:21022942} Pain improves with: {PAIN IMPROVES TPUY:78977056} Relief from Meds: {NUMBERS; 0-10:5044}  {MOBILITY JIO:78977055}  {FUNCTION:21022946}  {NEURO/PSYCH:21022948}  {CPRM PRIOR STUDIES:21022953}  {CPRM PHYSICIANS INVOLVED IN YOUR CARE:21022954}    Family History  Problem Relation Age of Onset   Breast cancer Maternal Aunt    Heart attack Maternal Grandmother    Stroke Maternal Grandmother    Diabetes Maternal Grandfather    Lung cancer Maternal Grandfather    Other Other    Alcohol abuse Other    Social History   Socioeconomic History   Marital status: Single    Spouse name: Not on file   Number of children: Not on file   Years of education: Not on file   Highest education level: Not on file  Occupational History   Not on file  Tobacco Use   Smoking status: Former    Current packs/day: 1.00    Average packs/day: 1 pack/day for 10.0 years (10.0 ttl pk-yrs)    Types: Cigarettes   Smokeless tobacco: Never   Tobacco comments:    Quit 2025   Vaping Use   Vaping status: Never Used  Substance and Sexual Activity   Alcohol use: Not Currently   Drug use: Yes    Types: Marijuana    Comment: =   Sexual activity: Not on file  Other Topics Concern   Not on file  Social History Narrative   Not on file   Social Drivers of Health   Tobacco Use: Medium Risk (07/06/2024)   Patient History    Smoking Tobacco  Use: Former    Smokeless Tobacco Use: Never    Passive Exposure: Not on [place] Strain: Not on file  Food Insecurity: Food Insecurity Present (05/21/2024)   Epic    Worried About Programme Researcher, Broadcasting/film/video in the Last Year: Sometimes true    Ran Out of Food in the Last Year: Sometimes true  Transportation Needs: No Transportation Needs (05/21/2024)   Epic    Lack of Transportation (Medical): No    Lack of Transportation (Non-Medical): No  Physical Activity: Not on file  Stress: Not on file  Social Connections: Not on file  Depression (PHQ2-9): Low Risk (06/22/2024)   Depression (PHQ2-9)    PHQ-2 Score: 3  Alcohol Screen: Not on file  Housing: High Risk (05/21/2024)   Epic    Unable to Pay for Housing in the Last Year: Yes    Number of Times Moved in the Last Year: 2    Homeless in the Last Year: No  Utilities: Not At Risk (05/21/2024)   Epic    Threatened with loss of utilities: No  Health Literacy: Not on file   Past Surgical History:  Procedure Laterality Date   CLOSED REDUCTION FINGER WITH PERCUTANEOUS PINNING Right 06/22/2013   Procedure: RIGHT HAND SMALL FINGER CLOSED MANIPULATION AND PINNING;  Surgeon: Prentice LELON Pagan, MD;  Location: [facility] OR;  Service: Orthopedics;  Laterality: Right;  HUMERUS IM NAIL Right 05/21/2024   Procedure: INSERTION, INTRAMEDULLARY ROD, HUMERUS;  Surgeon: Dozier Soulier, MD;  Location: [facility] OR;  Service: Orthopedics;  Laterality: Right;   I & D EXTREMITY  03/24/2012   Procedure: IRRIGATION AND DEBRIDEMENT EXTREMITY;  Surgeon: Oneil JAYSON Herald, MD;  Location: [facility] OR;  Service: Orthopedics;  Laterality: Right;  Right Forearm   POSTERIOR CERVICAL FUSION/FORAMINOTOMY N/A 05/05/2024   Procedure: POSTERIOR CERVICAL FUSION CERVICAL SEVEN-THORACIC ONE, THORACIC ONE-THORACIC TWO, THORACIC TWO-THORACIC THREE ,FOR RESECTION OF TUMOR THORACIC TWO CORPECTOMY;  Surgeon: Darnella Dorn SAUNDERS, MD;  Location: [facility] OR;  Service: Neurosurgery;  Laterality: N/A;   Past  Medical History:  Diagnosis Date   Anemia    Anxiety    Asthma    as a child   Depression    Narcotic abuse (HCC)    Nasal septal perforation    Pulmonary nodules 04/29/2024   There were no vitals taken for this visit.  Opioid Risk Score:   Fall Risk Score:  `1  Depression screen [facility] 2/9     06/22/2024    9:17 AM  Depression screen PHQ 2/9  Decreased Interest 0  Down, Depressed, Hopeless 0  PHQ - 2 Score 0  Altered sleeping 0  Tired, decreased energy 1  Change in appetite 1  Feeling bad or failure about yourself  1  Trouble concentrating 0  Moving slowly or fidgety/restless 0  Suicidal thoughts 0  PHQ-9 Score 3  Difficult doing work/chores Not difficult at all    Review of Systems     Objective:   Physical Exam        Assessment & Plan:    "

## 2024-07-12 ENCOUNTER — Other Ambulatory Visit: Payer: Self-pay

## 2024-07-12 ENCOUNTER — Other Ambulatory Visit (HOSPITAL_COMMUNITY): Payer: Self-pay

## 2024-07-12 DIAGNOSIS — Z515 Encounter for palliative care: Secondary | ICD-10-CM

## 2024-07-12 DIAGNOSIS — C7951 Secondary malignant neoplasm of bone: Secondary | ICD-10-CM

## 2024-07-12 DIAGNOSIS — C73 Malignant neoplasm of thyroid gland: Secondary | ICD-10-CM

## 2024-07-12 DIAGNOSIS — G893 Neoplasm related pain (acute) (chronic): Secondary | ICD-10-CM

## 2024-07-12 MED ORDER — OXYCODONE HCL 10 MG PO TABS
20.0000 mg | ORAL_TABLET | ORAL | 0 refills | Status: DC | PRN
  Filled ????-??-??: fill #0

## 2024-07-12 NOTE — Telephone Encounter (Signed)
 Pt called and LVM asking for a refill and a call back, RN attempted to call back, no answer, LVM. Will route refill to NP.

## 2024-07-13 ENCOUNTER — Other Ambulatory Visit: Payer: Self-pay | Admitting: Nurse Practitioner

## 2024-07-13 ENCOUNTER — Other Ambulatory Visit (HOSPITAL_COMMUNITY): Payer: Self-pay

## 2024-07-13 DIAGNOSIS — C7801 Secondary malignant neoplasm of right lung: Secondary | ICD-10-CM

## 2024-07-13 DIAGNOSIS — G893 Neoplasm related pain (acute) (chronic): Secondary | ICD-10-CM

## 2024-07-13 DIAGNOSIS — Z515 Encounter for palliative care: Secondary | ICD-10-CM

## 2024-07-13 DIAGNOSIS — C7951 Secondary malignant neoplasm of bone: Secondary | ICD-10-CM

## 2024-07-13 MED ORDER — OXYCODONE HCL 20 MG PO TABS
20.0000 mg | ORAL_TABLET | ORAL | 0 refills | Status: DC | PRN
  Filled 2024-07-13: qty 90, 15d supply, fill #0

## 2024-07-14 ENCOUNTER — Other Ambulatory Visit (HOSPITAL_COMMUNITY): Payer: Self-pay

## 2024-07-15 ENCOUNTER — Encounter: Payer: Self-pay | Admitting: Registered Nurse

## 2024-07-15 ENCOUNTER — Encounter: Payer: MEDICAID | Attending: Registered Nurse | Admitting: Registered Nurse

## 2024-07-15 ENCOUNTER — Encounter (INDEPENDENT_AMBULATORY_CARE_PROVIDER_SITE_OTHER): Payer: Self-pay

## 2024-07-15 VITALS — BP 120/80 | HR 100 | Ht 69.0 in | Wt 343.0 lb

## 2024-07-15 DIAGNOSIS — G8222 Paraplegia, incomplete: Secondary | ICD-10-CM | POA: Diagnosis not present

## 2024-07-15 DIAGNOSIS — C7951 Secondary malignant neoplasm of bone: Secondary | ICD-10-CM | POA: Insufficient documentation

## 2024-07-15 DIAGNOSIS — S24101A Unspecified injury at T1 level of thoracic spinal cord, initial encounter: Secondary | ICD-10-CM | POA: Diagnosis not present

## 2024-07-15 NOTE — Progress Notes (Unsigned)
 "  Subjective:    Patient ID: Joseph [name], male    DOB: [birth year], 44 y.o.   MRN: [id number]  HPI: Joseph [name] is a 44 y.o. male who  is here for HFU appointment for F/U of his Acute Incomplete paraplegia, Malignant Neoplasm metastatic to bone and Spinal cord injury at T1-T6 level. He presented to [facility] on 04/27/2024 due to worsening lower back pain and rib pain for several weeks. He was transferred to [facility] for admission.   DG: Chest:   IMPRESSION: Vague opacity in the left upper lung, nodule not excluded. Suggest further assessment with chest CT.  DG: Lumbar:   IMPRESSION: Mild degenerative changes at L5-S1.  CT: Lumbar:  IMPRESSION: 1. Large Schmorl's node at the inferior L5 endplate, which may be painful. 2. No specific evidence of discitis by CT. An MRI with contrast could provide more sensitive evaluation. 3. No evidence of acute fracture or malalignment. 4. Cholelithiasis.  CT: Angio: Chest: IMPRESSION: 1. Right upper lobe segmental pulmonary embolism. Main pulmonary artery is enlarged compatible with pulmonary arterial hypertension. No evidence for right heart strain. 2. Numerous scattered bilateral solid pulmonary nodules measuring up to 6 mm in the right lower lobe. Metastatic disease not excluded given appearance and distribution. 3. Enlarged and heterogeneous left thyroid  lobe measuring up to 5 cm with rightward tracheal deviation. Recommend non-emergent thyroid  ultrasound. 4. Small hiatal hernia and gallstones.  MR: Lumbar:  IMPRESSION: 1. No evidence of discitis at L5-S1. 2. Broad-based disc protrusion at L4-5 with moderate left and mild right foraminal stenosis. 3. Right paramedian disc protrusion at L5-S1 with mild bilateral foraminal narrowing and displacement of the right S1 nerve roots.  MR: Brain:  IMPRESSION: 1. Normal brain MRI.  US : Thyroid :  IMPRESSION: Solitary large TI-RADS category 4 nodule occupies the  majority of the left mid gland. This lesion meets criteria to consider fine-needle aspiration biopsy and biopsy is recommended.   The above is in keeping with the ACR TI-RADS recommendations - J Am Coll Radiol 2017;14:587-595.  MR: Thoracic:  IMPRESSION: 1. Enhancing lesion within the T2 vertebral body with pathologic fracture resulting in severe vertebral body height loss, concerning for metastatic disease. 2. Significant bowing of the T2 posterior cortex causing severe spinal canal stenosis and cord compression. No definite cord edema. 3. Abnormal enhancement extending into the ventral epidural space at T1T2 and into the left T1T2 and T2T3 neural foramina abutting the exiting T1 and T2 nerve roots, concerning for extraosseous involvement of disease. 4. Edema and mild enhancement in the posterior T3 vertebral body, pedicles, and pars interarticularis, possibly representing additional osseous lesion versus reactive changes in the setting of fracture. 5. Enlarged left thyroid  lobe with a heterogeneous nodule measuring at least 4.5 cm. Recommend management per thyroid  ultrasound report. 6. Impression 1 and 2 discussed with Dr. Jadine at 6:17PM on 05/01/24.  MR: Cervical Spine:  IMPRESSION: 1. No suspicious osseous lesion in the cervical spine. 2. 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 Subtle thoracic cord signal abnormality suggesting early cord edema. 3. Small disc bulge at C5-C6, slightly left eccentric, indenting the ventral thecal sac with subtle ventral cord flattening and no cord signal abnormality. 4. Additional degenerative changes as above.  Neurology was consulted:  He was transferred to [facility]  Neurosurgery was consulted: He underwent: on 05/05/2024: Dr. Darnella:  POSTERIOR CERVICAL FUSION CERVICAL SEVEN-THORACIC ONE, THORACIC ONE-THORACIC TWO, THORACIC TWO-THORACIC THREE ,FOR RESECTION OF TUMOR THORACIC TWO CORPECTOMY N/A  General  APPLICATION  OF O-ARM    Oncology was consulted.  Palliative Care Consulted.   Joseph [name] was admitted to inpatient Rehabilitation on 05/16/2024 and was discharged to acute services.  He underwent: INSERTION, INTRAMEDULLARY ROD, HUMERUS : on 05/21/2024 by Dr. Dozier  He was admitted to inpatient rehabilitation on 06/02/2024 and discharged home on 06/20/2024. [facility] was called by this provider, he has a scheduled appointment.  He states he has pain in his right shoulder and upper and lower back. He rates his pain 2. Also reports he has a good appetite.   Family in room.   Pain Inventory Average Pain 3 Pain Right Now 2 My pain is dull, tingling, aching, and soreness   LOCATION OF PAIN  legs, back, right shoulder  BOWEL Number of stools per week: 5-6 Oral laxative use No    BLADDER Normal   Mobility walk with assistance use a walker ability to climb steps?  yes do you drive?  no use a wheelchair transfers alone Do you have any goals in this area?  yes  Function disabled: date disabled applied I need assistance with the following:  household duties and shopping Do you have any goals in this area?  yes  Neuro/Psych weakness numbness tingling trouble walking spasms  Prior Studies x-rays Of the back at [facility] involved in your care  Any changes since last visit?  yes Dr. Patrici ENT   Family History  Problem Relation Age of Onset   Breast cancer Maternal Aunt    Heart attack Maternal Grandmother    Stroke Maternal Grandmother    Diabetes Maternal Grandfather    Lung cancer Maternal Grandfather    Other Other    Alcohol abuse Other    Social History   Socioeconomic History   Marital status: Single    Spouse name: Not on file   Number of children: Not on file   Years of education: Not on file   Highest education level: Not on file  Occupational History   Not on file  Tobacco Use   Smoking status: Former    Current  packs/day: 1.00    Average packs/day: 1 pack/day for 10.0 years (10.0 ttl pk-yrs)    Types: Cigarettes   Smokeless tobacco: Never   Tobacco comments:    Quit 2025   Vaping Use   Vaping status: Never Used  Substance and Sexual Activity   Alcohol use: Not Currently   Drug use: Not Currently    Types: Marijuana    Comment: =   Sexual activity: Not on file  Other Topics Concern   Not on file  Social History Narrative   Not on file   Social Drivers of Health   Tobacco Use: Medium Risk (07/14/2024)   Received from [facility]   Patient History    Smoking Tobacco Use: Former    Smokeless Tobacco Use: Never    Passive Exposure: Not on file  Financial Resource Strain: Not on file  Food Insecurity: Food Insecurity Present (05/21/2024)   Epic    Worried About Programme Researcher, Broadcasting/film/video in the Last Year: Sometimes true    Ran Out of Food in the Last Year: Sometimes true  Transportation Needs: No Transportation Needs (05/21/2024)   Epic    Lack of Transportation (Medical): No    Lack of Transportation (Non-Medical): No  Physical Activity: Not on file  Stress: Not on file  Social Connections: Not on file  Depression (  PHQ2-9): Low Risk (06/22/2024)   Depression (PHQ2-9)    PHQ-2 Score: 3  Alcohol Screen: Not on file  Housing: Unknown (07/14/2024)   Received from [facility]   Epic    Unable to Pay for Housing in the Last Year: Not on file    Number of Times Moved in the Last Year: Not on file    At any time in the past 12 months, were you homeless or living in a shelter (including now)?: No  Recent Concern: Housing - High Risk (05/21/2024)   Epic    Unable to Pay for Housing in the Last Year: Yes    Number of Times Moved in the Last Year: 2    Homeless in the Last Year: No  Utilities: Not At Risk (05/21/2024)   Epic    Threatened with loss of utilities: No  Health Literacy: Not on file   Past Surgical History:  Procedure Laterality Date    CLOSED REDUCTION FINGER WITH PERCUTANEOUS PINNING Right 06/22/2013   Procedure: RIGHT HAND SMALL FINGER CLOSED MANIPULATION AND PINNING;  Surgeon: Prentice LELON Pagan, MD;  Location: [facility] OR;  Service: Orthopedics;  Laterality: Right;   HUMERUS IM NAIL Right 05/21/2024   Procedure: INSERTION, INTRAMEDULLARY ROD, HUMERUS;  Surgeon: Dozier Soulier, MD;  Location: [facility] OR;  Service: Orthopedics;  Laterality: Right;   I & D EXTREMITY  03/24/2012   Procedure: IRRIGATION AND DEBRIDEMENT EXTREMITY;  Surgeon: Oneil JAYSON Herald, MD;  Location: [facility] OR;  Service: Orthopedics;  Laterality: Right;  Right Forearm   POSTERIOR CERVICAL FUSION/FORAMINOTOMY N/A 05/05/2024   Procedure: POSTERIOR CERVICAL FUSION CERVICAL SEVEN-THORACIC ONE, THORACIC ONE-THORACIC TWO, THORACIC TWO-THORACIC THREE ,FOR RESECTION OF TUMOR THORACIC TWO CORPECTOMY;  Surgeon: Darnella Dorn SAUNDERS, MD;  Location: [facility] OR;  Service: Neurosurgery;  Laterality: N/A;   Past Medical History:  Diagnosis Date   Anemia    Anxiety    Asthma    as a child   Depression    Narcotic abuse (HCC)    Nasal septal perforation    Pulmonary nodules 04/29/2024   Ht 5' 9 (1.753 m)   Wt (!) 343 lb (155.6 kg)   BMI 50.65 kg/m   Opioid Risk Score:   Fall Risk Score:  `1  Depression screen [facility] 2/9     06/22/2024    9:17 AM  Depression screen PHQ 2/9  Decreased Interest 0  Down, Depressed, Hopeless 0  PHQ - 2 Score 0  Altered sleeping 0  Tired, decreased energy 1  Change in appetite 1  Feeling bad or failure about yourself  1  Trouble concentrating 0  Moving slowly or fidgety/restless 0  Suicidal thoughts 0  PHQ-9 Score 3  Difficult doing work/chores Not difficult at all    Review of Systems  Musculoskeletal:  Positive for gait problem.       Spasms  Neurological:  Positive for weakness and numbness.       Tingling  All other systems reviewed and are negative.      Objective:   Physical Exam Vitals and nursing note reviewed.  Constitutional:       Appearance: Normal appearance. He is obese.  Cardiovascular:     Rate and Rhythm: Normal rate and regular rhythm.     Pulses: Normal pulses.     Heart sounds: Normal heart sounds.  Pulmonary:     Effort: Pulmonary effort is normal.     Breath sounds: Normal breath sounds.  Musculoskeletal:  Comments: Normal Muscle Bulk and Muscle Testing Reveals:  Upper Extremities: Right: Decrease ROM 90 Degrees and Muscle Strength 5/5 Left Upper Extremity: Full ROM and Muscle Strength 5/5 Thoracic Hypersensitivity: T-4-T-6  Lumbar Paraspinal Tenderness: L-3-L-5 Lower Extremities: Decreased ROM and Muscle Strength 5/5 Arises from Table slowly using walker for support Antalgic  Gait     Skin:    General: Skin is warm and dry.  Neurological:     Mental Status: He is alert and oriented to person, place, and time.  Psychiatric:        Mood and Affect: Mood normal.        Behavior: Behavior normal.          Assessment & Plan:  Acute Incomplete paraplegia, Malignant Neoplasm metastatic to bone and Spinal cord injury at T1-T6 level. He has a scheduled appointment with [facility].  Palliative Care following and prescribing his Methadone  and Oxycodone .  Oncology , Orthopedic and Endocrinology Following.   F/U with Dr Lovorn in 4- 6 weeks  "

## 2024-07-17 ENCOUNTER — Other Ambulatory Visit (INDEPENDENT_AMBULATORY_CARE_PROVIDER_SITE_OTHER): Payer: Self-pay

## 2024-07-17 DIAGNOSIS — C73 Malignant neoplasm of thyroid gland: Secondary | ICD-10-CM

## 2024-07-17 NOTE — Addendum Note (Signed)
 Addended by: ANICE RIIS on: 07/17/2024 01:31 PM   Modules accepted: Orders

## 2024-07-17 NOTE — Progress Notes (Signed)
 "  Provider: Akilah Cureton FNP-C  Jolanda Mccann, Roxan BROCKS, NP  Patient Care Team: Khylei Wilms, Roxan BROCKS, NP as PCP - General (Family Medicine) Dozier Soulier, MD as Consulting Physician (Orthopedic Surgery) Tina Pauletta BROCKS, MD as Consulting Physician (Oncology) Faythe Purchase, MD as Consulting Physician (Endocrinology) [name], MD as Consulting Physician (Physical Medicine and Rehabilitation) Pickenpack-Cousar, Fannie SAILOR, NP as Nurse Practitioner [facility])  Extended Emergency Contact Information Primary Emergency Contact: Bass,Paula Address: [address]          [place], [place] [address] [place] Home Phone: [phone] Relation: Mother Secondary Emergency Contact: Mccook,Pavo Mobile Phone: [phone] Relation: Father  Code Status: Full Code  Goals of care: Advanced Directive information    06/22/2024    9:19 AM  Advanced Directives  Does Patient Have a Medical Advance Directive? No  Would patient like information on creating a medical advance directive? No - Patient declined     Chief Complaint  Patient presents with   Throat Concerns     Hoarseness x 2 weeks. Patient denies sore throat.   Weight Gain    Patient would like to discuss weight loss options.    Form Completion    Patient would like [employer] [place] form completed today     History of Present Illness   Joseph [name] is a 44 year old male who presents with hoarseness of voice for three weeks.  He has experienced hoarseness of voice for approximately three weeks without associated sore throat, drainage, fever, or chills. He occasionally coughs to clear mucus from his chest, but this occurs infrequently. No exposure to COVID-19 or flu. No issues with acid reflux or swollen lymph nodes. He has not started any new medications or supplements recently and continues with his usual medication regimen.  Approximately six weeks ago, he underwent back surgery for acute  bilateral low back pain, which included a fusion. He also had surgery for a fracture of the right hand and an abscess on the same hand. He uses a wheelchair and walker due to back pain.  Also, here to complete paperwork for disability transportation access ([employer] [place]) due to his mobility issues, which are primarily due to back pain. He uses a manual heavy-duty wheelchair and can travel short distances independently but requires assistance for longer distances or uneven terrain.  He has not been diagnosed with sleep apnea, though he occasionally snores. He is interested in weight loss options.   Past Medical History:  Diagnosis Date   Anemia    Anxiety    Asthma    as a child   Depression    Narcotic abuse (HCC)    Nasal septal perforation    Pulmonary nodules 04/29/2024   Past Surgical History:  Procedure Laterality Date   CLOSED REDUCTION FINGER WITH PERCUTANEOUS PINNING Right 06/22/2013   Procedure: RIGHT HAND SMALL FINGER CLOSED MANIPULATION AND PINNING;  Surgeon: Prentice LELON Pagan, MD;  Location: [facility] OR;  Service: Orthopedics;  Laterality: Right;   HUMERUS IM NAIL Right 05/21/2024   Procedure: INSERTION, INTRAMEDULLARY ROD, HUMERUS;  Surgeon: Dozier Soulier, MD;  Location: [facility] OR;  Service: Orthopedics;  Laterality: Right;   I & D EXTREMITY  03/24/2012   Procedure: IRRIGATION AND DEBRIDEMENT EXTREMITY;  Surgeon: Oneil BROCKS Herald, MD;  Location: [facility] OR;  Service: Orthopedics;  Laterality: Right;  Right Forearm   POSTERIOR CERVICAL FUSION/FORAMINOTOMY N/A 05/05/2024   Procedure: POSTERIOR CERVICAL FUSION CERVICAL SEVEN-THORACIC ONE, THORACIC ONE-THORACIC TWO, THORACIC TWO-THORACIC  THREE ,FOR RESECTION OF TUMOR THORACIC TWO CORPECTOMY;  Surgeon: Darnella Dorn SAUNDERS, MD;  Location: [facility] OR;  Service: Neurosurgery;  Laterality: N/A;    Allergies[1]  Outpatient Encounter Medications as of 07/06/2024  Medication Sig   acetaminophen  (TYLENOL ) 500 MG tablet Take 2 tablets (1,000 mg total) by mouth 3  (three) times daily.   albuterol  (PROVENTIL ) (2.5 MG/3ML) 0.083% nebulizer solution Take 3 mLs (2.5 mg total) by nebulization every 6 (six) hours as needed for wheezing or shortness of breath.   apixaban  (ELIQUIS ) 5 MG TABS tablet Take 1 tablet (5 mg total) by mouth 2 (two) times daily.   cyclobenzaprine  (FLEXERIL ) 5 MG tablet Take 1 tablet (5 mg total) by mouth 3 (three) times daily as needed.   Ensure Max Protein (ENSURE MAX PROTEIN) LIQD Take 11 oz by mouth 2 (two) times daily.   fluticasone  (FLONASE ) 50 MCG/ACT nasal spray Place 2 sprays into both nostrils daily.   furosemide  (LASIX ) 40 MG tablet Take 1 tablet (40 mg total) by mouth daily.   guaiFENesin  (MUCINEX ) 600 MG 12 hr tablet Take 2 tablets (1,200 mg total) by mouth 2 (two) times daily.   iron  polysaccharides (NIFEREX) 150 MG capsule Take 1 capsule (150 mg total) by mouth daily.   lidocaine  (LIDODERM ) 5 % Place 2 patches onto the skin daily. Remove & Discard patch within 12 hours or as directed by MD   methadone  (DOLOPHINE ) 10 MG tablet Take 2 tablets (20 mg total) by mouth every 8 (eight) hours.   Multiple Vitamin (MULTIVITAMIN WITH MINERALS) TABS tablet Take 1 tablet by mouth daily.   naloxone  (NARCAN ) 0.4 MG/ML injection Place 1 mL (0.4 mg total) into the nose as needed. TO BE USED FOR OPIOID OVERDOSE   polyethylene glycol (MIRALAX  / GLYCOLAX ) 17 g packet Take 17 g by mouth daily as needed.   potassium chloride  SA (KLOR-CON  M) 20 MEQ tablet Take 1 tablet (20 mEq total) by mouth daily.   pregabalin  (LYRICA ) 50 MG capsule Take 1 capsule (50 mg total) by mouth 3 (three) times daily.   sennosides-docusate sodium  (SENOKOT-S) 8.6-50 MG tablet Take 2 tablets by mouth in the morning and at bedtime.   tamsulosin  (FLOMAX ) 0.4 MG CAPS capsule Take 1 capsule (0.4 mg total) by mouth daily after supper.   traZODone  (DESYREL ) 50 MG tablet Take 1 tablet (50 mg total) by mouth at bedtime as needed for sleep.   [DISCONTINUED] Oxycodone  HCl 10 MG TABS  Take 2 tablets (20 mg total) by mouth every 4 (four) hours as needed for up to 15 days (for breakthrough pain. Max 8 tablets per day.).   [DISCONTINUED] polyethylene glycol powder (GLYCOLAX /MIRALAX ) 17 GM/SCOOP powder Dissolve 1 capful (17g) in 4-8 ounces of liquid and take by mouth daily. (Patient taking differently: Take 17 g by mouth as needed.)   No facility-administered encounter medications on file as of 07/06/2024.    Review of Systems  Constitutional:  Negative for appetite change, chills, fatigue, fever and unexpected weight change.  HENT:  Negative for congestion, dental problem, ear discharge, ear pain, facial swelling, hearing loss, nosebleeds, postnasal drip, rhinorrhea, sinus pressure, sinus pain, sneezing, sore throat, tinnitus and trouble swallowing.        Hoarseness   Eyes:  Negative for pain, discharge, redness, itching and visual disturbance.  Respiratory:  Negative for cough, chest tightness, shortness of breath and wheezing.   Cardiovascular:  Negative for chest pain, palpitations and leg swelling.  Gastrointestinal:  Negative for abdominal distention, abdominal pain,  blood in stool, constipation, diarrhea, nausea and vomiting.  Endocrine: Negative for cold intolerance, heat intolerance, polydipsia, polyphagia and polyuria.  Genitourinary:  Negative for difficulty urinating, dysuria, flank pain, frequency and urgency.  Musculoskeletal:  Positive for gait problem. Negative for arthralgias, back pain, joint swelling, myalgias, neck pain and neck stiffness.  Skin:  Negative for color change, pallor, rash and wound.  Neurological:  Negative for dizziness, syncope, speech difficulty, weakness, light-headedness, numbness and headaches.  Hematological:  Does not bruise/bleed easily.  Psychiatric/Behavioral:  Positive for sleep disturbance. Negative for agitation, behavioral problems, confusion, hallucinations and suicidal ideas. The patient is not nervous/anxious.      Immunization History  Administered Date(s) Administered   Influenza Split 03/25/2012   Influenza, Seasonal, Injecte, Preservative Fre 06/22/2024   PFIZER(Purple Top)SARS-COV-2 Vaccination 09/22/2019, 10/17/2019   Pneumococcal Polysaccharide-23 03/25/2012   Tdap 06/22/2024   Pertinent  Health Maintenance Due  Topic Date Due   Influenza Vaccine  Completed      08/16/2019    5:19 PM 05/05/2020    7:10 PM 06/27/2020    1:59 PM 06/22/2024    9:17 AM 07/15/2024    2:42 PM  Fall Risk  Falls in the past year?    0 0  Was there an injury with Fall?    0 0  Fall Risk Category Calculator    0 0  (RETIRED) Patient Fall Risk Level  Low fall risk  Low fall risk     Patient at Risk for Falls Due to    No Fall Risks   Fall risk Follow up    Falls evaluation completed      Information is confidential and restricted. Go to Review Flowsheets to unlock data.   Data saved with a previous flowsheet row definition   Functional Status Survey:    Vitals:   07/06/24 1228  BP: 132/84  Pulse: 99  Temp: 97.9 F (36.6 C)  SpO2: 99%  Weight: (!) 327 lb (148.3 kg)  Height: 5' 9 (1.753 m)   Body mass index is 48.29 kg/m. Physical Exam  VITALS: T- 97.9, P- 99, BP- 132/84, SaO2- 99% GENERAL: Alert, cooperative, well developed, no acute distress. HEENT: Normocephalic, normal oropharynx, moist mucous membranes. Cerumen impaction bilaterally. Nose normal. No sinus tenderness. Throat normal, no redness or signs of infection. NECK: No cervical adenopathy. CHEST: Clear to auscultation bilaterally. No wheezes, rhonchi, or crackles. CARDIOVASCULAR: Normal heart rate and rhythm, S1 and S2 normal without murmurs. ABDOMEN: Soft, non-tender, non-distended, without organomegaly. Normal bowel sounds. EXTREMITIES: No cyanosis or edema. NEUROLOGICAL: Cranial nerves grossly intact, moves all extremities without gross motor or sensory deficit.  SKIN: No rash,no lesion or erythema   PSYCHIATRY/BEHAVIORAL: Mood  stable   Labs reviewed: Recent Labs    05/23/24 0552 05/24/24 0435 05/25/24 0425 06/02/24 0404 06/18/24 0616 06/20/24 0547 06/22/24 1020  NA 137 137 139   < > 135 136 136  K 4.3 4.2 4.0   < > 4.2 4.2 4.2  CL 100 102 100   < > 98 99 99  CO2 30 27 27    < > 28 29 27   GLUCOSE 111* 113* 114*   < > 94 105* 111  BUN 12 12 9    < > 12 15 15   CREATININE 0.71 0.74 0.67   < > 0.69 0.69 0.74  CALCIUM 8.5* 8.4* 8.7*   < > 9.3 9.2 9.0  MG 1.8 1.7 1.7  --   --   --   --  PHOS 4.9* 4.6 4.8*  --   --   --   --    < > = values in this interval not displayed.   Recent Labs    05/24/24 0435 05/25/24 0425 06/03/24 0537 06/22/24 1020  AST 22 19 23  55*  ALT 36 36 44 118*  ALKPHOS 93 95 98  --   BILITOT 0.4 0.4 0.4 0.5  PROT 6.4* 6.4* 6.2* 7.2  ALBUMIN  2.2* 2.3* 2.3*  --    Recent Labs    05/25/24 0425 06/02/24 0404 06/03/24 0537 06/06/24 0559 06/16/24 0543 06/20/24 0547 06/22/24 1020  WBC 8.0   < > 7.7   < > 4.9 5.4 5.7  NEUTROABS 5.4  --  4.7  --   --   --  3,471  HGB 9.6*   < > 9.7*   < > 10.2* 10.7* 11.5*  HCT 29.5*   < > 30.9*   < > 33.2* 33.9* 35.7*  MCV 91.3   < > 93.1   < > 93.5 92.4 89.7  PLT PLATELET CLUMPS NOTED ON SMEAR, UNABLE TO ESTIMATE   < > PLATELET CLUMPS NOTED ON SMEAR, UNABLE TO ESTIMATE   < > PLATELET CLUMPS NOTED ON SMEAR, UNABLE TO ESTIMATE PLATELET CLUMPS NOTED ON SMEAR, UNABLE TO ESTIMATE 25*   < > = values in this interval not displayed.   Lab Results  Component Value Date   TSH 3.700 04/30/2024   No results found for: HGBA1C Lab Results  Component Value Date   CHOL 94 06/22/2024   HDL 37 (L) 06/22/2024   LDLCALC 39 06/22/2024   TRIG 98 06/22/2024   CHOLHDL 2.5 06/22/2024    Significant Diagnostic Results in last 30 days:  No results found.  Assessment/Plan  Hoarseness of voice Hoarseness for 2-3 weeks without sore throat, fever, or chills. Negative throat swab for strep. No signs of infection or redness. Differential includes possible ENT  issue. - Continue with ENT appointment on January 16th, 2026. - Use warm water  and salt gargles to reduce potential bacterial buildup. - Increase water  intake and consume warm tea. - Monitor for fever, chills, or runny nose and report if symptoms develop.  Morbid obesity Interest in weight loss. Insurance coverage for weight loss medications like Ozempic and Wegovy are expensive without insurance coverage. - Referred to weight management program for further evaluation and potential medication options.  Low back pain after spinal fusion Low back pain following spinal fusion surgery on November 5th, 2024. Recovery is expected to be temporary. Uses a manual wheelchair and walker for mobility. Balance is not optimal, and he cannot cross the street without assistance. - Completed and submitted [employer]TA form for mobility assistance. - Continue using wheelchair and walker as needed for mobility.   Family/ staff Communication: Reviewed plan of care with patient verbalized understanding   Labs/tests ordered: None   Next Appointment: Return if symptoms worsen or fail to improve.   Total time: 30 minutes. Greater than 50% of total time spent doing patient education regarding Hoarseness of voice,Low back pain after spinal fusion,Morbid obesity,health maintenance including symptom/medication management.   Roxan BROCKS Latravious Levitt, NP    [1] No Known Allergies  "

## 2024-07-18 ENCOUNTER — Ambulatory Visit
Admit: 2024-07-18 | Discharge: 2024-07-18 | Disposition: A | Discharge status: Home / Self Care | Payer: MEDICAID | Attending: Radiation Oncology | Admitting: Radiation Oncology

## 2024-07-18 DIAGNOSIS — C7951 Secondary malignant neoplasm of bone: Secondary | ICD-10-CM

## 2024-07-18 DIAGNOSIS — C7801 Secondary malignant neoplasm of right lung: Secondary | ICD-10-CM

## 2024-07-18 DIAGNOSIS — C73 Malignant neoplasm of thyroid gland: Secondary | ICD-10-CM

## 2024-07-19 ENCOUNTER — Other Ambulatory Visit (HOSPITAL_COMMUNITY): Payer: Self-pay

## 2024-07-19 ENCOUNTER — Other Ambulatory Visit: Payer: Self-pay | Admitting: Family

## 2024-07-19 ENCOUNTER — Telehealth: Payer: Self-pay

## 2024-07-19 ENCOUNTER — Encounter (INDEPENDENT_AMBULATORY_CARE_PROVIDER_SITE_OTHER): Payer: Self-pay

## 2024-07-19 ENCOUNTER — Encounter: Payer: Self-pay | Admitting: Registered Nurse

## 2024-07-19 NOTE — Telephone Encounter (Signed)
 Copied from CRM [id number]. Topic: Clinical - Medication Refill >> Jul 19, 2024 11:39 AM Miquel SAILOR wrote: Medication: yclobenzaprine (FLEXERIL ) 5 MG tablet [[id number]]  Has the patient contacted their pharmacy? Yes (Agent: If no, request that the patient contact the pharmacy for the refill. If patient does not wish to contact the pharmacy document the reason why and proceed with request.) (Agent: If yes, when and what did the pharmacy advise?)  This is the patient's preferred pharmacy:    [place] [facility] [address] [place] [address] Phone: [phone] Fax: [phone]  Is this the correct pharmacy for this prescription? Yes If no, delete pharmacy and type the correct one.   Has the prescription been filled recently? Yes  Is the patient out of the medication? Yes  Has the patient been seen for an appointment in the last year OR does the patient have an upcoming appointment? Yes  Can we respond through MyChart? Yes  Agent: Please be advised that Rx refills may take up to 3 business days. We ask that you follow-up with your pharmacy. >> Jul 19, 2024 11:43 AM Miquel SAILOR wrote: PT requesting all medications refilled   polyethylene glycol (MIRALAX  / GLYCOLAX ) 17 g packet potassium chloride  SA (KLOR-CON  M) 20 MEQ tablet pregabalin  (LYRICA ) 50 MG capsule sennosides-docusate sodium  (SENOKOT-S) 8.6-50 MG tablet tamsulosin  (FLOMAX ) 0.4 MG CAPS capsule traZODone  (DESYREL ) 50 MG tablet Multiple Vitamin (MULTIVITAMIN WITH MINERALS) TABS tablet naloxone  (NARCAN ) 0.4 MG/ML injection acetaminophen  (TYLENOL ) 500 MG tablet albuterol  (PROVENTIL ) (2.5 MG/3ML) 0.083% nebulizer solution apixaban  (ELIQUIS ) 5 MG TABS tablet cyclobenzaprine  (FLEXERIL ) 5 MG tablet Ensure Max Protein (ENSURE MAX PROTEIN) LIQD fluticasone  (FLONASE ) 50 MCG/ACT nasal spray furosemide  (LASIX ) 40 MG tablet guaiFENesin  (MUCINEX ) 600 MG 12 hr tablet iron  polysaccharides (NIFEREX) 150 MG  capsule lidocaine  (LIDODERM ) 5 %

## 2024-07-21 ENCOUNTER — Other Ambulatory Visit (HOSPITAL_COMMUNITY): Payer: Self-pay

## 2024-07-21 ENCOUNTER — Other Ambulatory Visit: Payer: Self-pay

## 2024-07-21 ENCOUNTER — Telehealth: Payer: Self-pay | Admitting: Nurse Practitioner

## 2024-07-21 ENCOUNTER — Encounter (INDEPENDENT_AMBULATORY_CARE_PROVIDER_SITE_OTHER): Payer: Self-pay

## 2024-07-21 ENCOUNTER — Ambulatory Visit (INDEPENDENT_AMBULATORY_CARE_PROVIDER_SITE_OTHER): Payer: MEDICAID

## 2024-07-21 ENCOUNTER — Other Ambulatory Visit: Payer: Self-pay | Admitting: Family

## 2024-07-21 VITALS — BP 168/72 | HR 83 | Wt 334.0 lb

## 2024-07-21 DIAGNOSIS — C7951 Secondary malignant neoplasm of bone: Secondary | ICD-10-CM | POA: Diagnosis not present

## 2024-07-21 DIAGNOSIS — M84521A Pathological fracture in neoplastic disease, right humerus, initial encounter for fracture: Secondary | ICD-10-CM

## 2024-07-21 DIAGNOSIS — J3489 Other specified disorders of nose and nasal sinuses: Secondary | ICD-10-CM

## 2024-07-21 DIAGNOSIS — R490 Dysphonia: Secondary | ICD-10-CM

## 2024-07-21 DIAGNOSIS — C73 Malignant neoplasm of thyroid gland: Secondary | ICD-10-CM | POA: Diagnosis not present

## 2024-07-21 DIAGNOSIS — E041 Nontoxic single thyroid nodule: Secondary | ICD-10-CM | POA: Diagnosis not present

## 2024-07-21 DIAGNOSIS — F1420 Cocaine dependence, uncomplicated: Secondary | ICD-10-CM

## 2024-07-21 DIAGNOSIS — M8448XK Pathological fracture, other site, subsequent encounter for fracture with nonunion: Secondary | ICD-10-CM

## 2024-07-21 MED ORDER — APIXABAN 5 MG PO TABS: 5.0000 mg | ORAL_TABLET | Freq: Two times a day (BID) | ORAL | 1 refills | Status: AC

## 2024-07-21 MED ORDER — SENNA-DOCUSATE SODIUM 8.6-50 MG PO TABS: 2.0000 | ORAL_TABLET | Freq: Two times a day (BID) | ORAL | 1 refills | Status: AC

## 2024-07-21 MED ORDER — FUROSEMIDE 40 MG PO TABS: 40.0000 mg | ORAL_TABLET | Freq: Every day | ORAL | 1 refills | Status: AC

## 2024-07-21 MED ORDER — PREGABALIN 50 MG PO CAPS: 50.0000 mg | ORAL_CAPSULE | Freq: Three times a day (TID) | ORAL | 5 refills | Status: AC

## 2024-07-21 MED ORDER — POTASSIUM CHLORIDE CRYS ER 20 MEQ PO TBCR: 20.0000 meq | EXTENDED_RELEASE_TABLET | Freq: Every day | ORAL | 1 refills | Status: AC

## 2024-07-21 MED ORDER — TRAZODONE HCL 50 MG PO TABS: 50.0000 mg | ORAL_TABLET | Freq: Every evening | ORAL | 5 refills | Status: AC | PRN

## 2024-07-21 MED ORDER — TAMSULOSIN HCL 0.4 MG PO CAPS: 0.4000 mg | ORAL_CAPSULE | Freq: Every day | ORAL | 1 refills | Status: AC

## 2024-07-21 MED ORDER — POTASSIUM CHLORIDE CRYS ER 20 MEQ PO TBCR
20.0000 meq | EXTENDED_RELEASE_TABLET | Freq: Every day | ORAL | 0 refills | Status: AC
  Filled 2024-07-21: qty 30, 30d supply, fill #0

## 2024-07-21 NOTE — Progress Notes (Signed)
 Discussed the use of AI scribe software for clinical note transcription with the patient, who gave verbal consent to proceed.  History of Present Illness Joseph [name] is a 44 year old male with papillary thyroid  carcinoma who presents for preoperative evaluation of vocal cord function prior to planned thyroid  surgery.  Thyroid  Carcinoma and Preoperative Status: - Diagnosed with papillary thyroid  carcinoma - Scheduled for thyroid  surgery arranged by another provider, 08/09/24 - Recent hospitalization; currently ambulating with a walker - No hoarseness, voice changes, or throat discomfort  Nasal Septal Perforation: - Chronic nasal septal perforation - Attributes etiology to prior cocaine use  Otologic Symptoms: - Describes ears as dirty - No hearing loss, otalgia, or other otologic symptoms  Physical Exam HEENT: Cerumen present in ears, not significant. Septal perforation noted in nose. Vocal cords with normal function.  Given the patient's symptoms and incomplete visualization of critical sinonasal areas with anterior rhinoscopy, a separately performed diagnostic nasal endoscopy procedure is indicated for a complete rhinologic evaluation per American Rhinologic Society recommendations (https://[email or web address]-rhinologic.org/position-statements)  I personally performed, reviewed and interpreted the following with the patient today  Procedure Note Diagnostic Nasal Endoscopy CPT CODE -- 68768 - Mod 25  Prior to initiating any procedures, risks/benefits/alternatives were explained to the patient and verbal consent obtained.  Pre-procedure diagnosis: Concern for obstruction vs infection Post-procedure diagnosis: large septal perforation Indication: See pre-procedure diagnosis and physical exam above Complications: None apparent EBL: 0 mL Anesthesia: Lidocaine  4% and topical decongestant was topically sprayed in each nasal cavity  Description of Procedure:  Patient was identified.  A flexible fiberoptic endoscope was utilized to evaluate the sinonasal cavities, mucosa, sinus ostia and turbinates and septum.  Overall, signs of mucosal inflammation are noted.  Also noted are large septal perforation with crusting along posterior aspect.  No mucopurulence, polyps, or masses noted.   Right Middle meatus: clear Right SE Recess: clear Left MM: clear Left SE Recess: clear Photodocumentation was obtained.   Procedure Note Pre-procedure diagnosis:  Dysphonia, papillary thyroid  carcinoma Post-procedure diagnosis: Same Procedure: Transnasal Fiberoptic Laryngoscopy, CPT 31575 - Mod 25 Indication: preoperative  Complications: None apparent EBL: 0 mL  The procedure was undertaken to further evaluate the patient's complaint of papillary thyroid  carcinoma, with mirror exam inadequate for appropriate examination due to gag reflex and poor patient tolerance  Procedure:  Patient was identified as correct patient. Verbal consent was obtained. The nose was sprayed with oxymetazoline and 4% lidocaine . The The flexible laryngoscope was passed through the nose to view the nasal cavity, pharynx (oropharynx, hypopharynx) and larynx.  The larynx was examined at rest and during multiple phonatory tasks. Documentation was obtained and reviewed with patient. The scope was removed. The patient tolerated the procedure well.  Findings: The nasal cavity and nasopharynx did not reveal any masses or lesions, mucosa appeared to be without obvious lesions. The tongue base, pharyngeal walls, piriform sinuses, vallecula, epiglottis and postcricoid region are normal in appearance EXCEPT: none. The visualized portion of the subglottis and proximal trachea is widely patent. The vocal folds are mobile bilaterally. There are no lesions on the free edge of the vocal folds nor elsewhere in the larynx worrisome for malignancy.    Electronically signed by: Joseph Croak, DO 07/22/2024 1:04  PM         Assessment & Plan Papillary thyroid  carcinoma (HCC) - Plan: NM PET Image Initial (PI) Skull Base To Thigh (F-18 FDG)  Metastatic cancer to spine [facility])  Pathological fracture of thoracic vertebra with nonunion, subsequent encounter  Pathological fracture of right humerus due to neoplastic disease, initial encounter  Malignant neoplasm metastatic to bone [facility])  Thyroid  nodule  Nasal septal perforation  Cocaine use disorder, severe, dependence (HCC)   [name] with metastatic papillary thyroid  carcinoma, stage IV. Positive path in left thyroid  nodule, thoracic spine and humerus. Concern for pulmonary metastatic disease.   - planned total thyroidectomy with Dr. Stevie - Preoperative eval by me demonstrating intact bilateral true vocal fold movement - follow up with me in 2-3 months/PRN - will order PET CT as suggested by MDTB. Aggressive PTC often pet avid. Goal to assess distant spread including lateral and central neck.  Nasal septal perforation Chronic septal perforation likely due to prior cocaine use. Asymptomatic with minimal crusting. - Recommended saline nasal irrigations for crusting management. - Reassured no intervention needed unless perforation enlarges or becomes symptomatic. - will discuss possible biopsy at next visit  Thyroid  nodule - Per Dr. Stevie  Mild cerumen EAC bilaterally - okay to use otc cerumen removal. Return if hearing loss develops  Orders Placed This Encounter  Procedures   NM PET Image Initial (PI) Skull Base To Thigh (F-18 FDG)    Standing Status:   Future    Expiration Date:   07/22/2025    If indicated for the ordered procedure, I authorize the administration of a radiopharmaceutical per Radiology protocol:   Yes    Preferred imaging location?:   Joseph [name]

## 2024-07-21 NOTE — Telephone Encounter (Signed)
 All refills sent except:  1.) Potassium, warning populated when attempting to fill. 2.) Lyrica    Please review and advise

## 2024-07-21 NOTE — Telephone Encounter (Signed)
 Copied from CRM #[id number]. Topic: Clinical - Medication Refill >> Jul 21, 2024  9:28 AM Miquel SAILOR wrote: Medication: tamsulosin  (FLOMAX ) 0.4 MG CAPS capsule pregabalin  (LYRICA ) 50 MG capsule sennosides-docusate sodium  (SENOKOT-S) 8.6-50 MG tablet apixaban  (ELIQUIS ) 5 MG TABS tablet traZODone  (DESYREL ) 50 MG tablet potassium chloride  SA (KLOR-CON  M) 20 MEQ tablet furosemide  (LASIX ) 40 MG tablet    Has the patient contacted their pharmacy? Yes (Agent: If no, request that the patient contact the pharmacy for the refill. If patient does not wish to contact the pharmacy document the reason why and proceed with request.) (Agent: If yes, when and what did the pharmacy advise?)  This is the patient's preferred pharmacy:   [place] [facility] [address] [place] [address] Phone: [phone] Fax: [phone]  Is this the correct pharmacy for this prescription? Yes If no, delete pharmacy and type the correct one.   Has the prescription been filled recently? Yes  Is the patient out of the medication? No 1 pill left of each  Has the patient been seen for an appointment in the last year OR does the patient have an upcoming appointment? Yes  Can we respond through MyChart? Yes  Agent: Please be advised that Rx refills may take up to 3 business days. We ask that you follow-up with your pharmacy.

## 2024-07-26 ENCOUNTER — Other Ambulatory Visit (HOSPITAL_COMMUNITY): Payer: Self-pay

## 2024-07-26 MED ORDER — POLYSACCHARIDE IRON COMPLEX 150 MG PO CAPS
150.0000 mg | ORAL_CAPSULE | Freq: Every day | ORAL | 3 refills | Status: AC
  Filled 2024-07-26: qty 30, 30d supply, fill #0

## 2024-07-27 ENCOUNTER — Other Ambulatory Visit (HOSPITAL_COMMUNITY): Payer: Self-pay

## 2024-07-28 ENCOUNTER — Other Ambulatory Visit (HOSPITAL_COMMUNITY): Payer: Self-pay

## 2024-07-28 ENCOUNTER — Other Ambulatory Visit: Payer: Self-pay

## 2024-07-28 DIAGNOSIS — G893 Neoplasm related pain (acute) (chronic): Secondary | ICD-10-CM

## 2024-07-28 DIAGNOSIS — Z515 Encounter for palliative care: Secondary | ICD-10-CM

## 2024-07-28 DIAGNOSIS — C7951 Secondary malignant neoplasm of bone: Secondary | ICD-10-CM

## 2024-07-28 DIAGNOSIS — C7801 Secondary malignant neoplasm of right lung: Secondary | ICD-10-CM

## 2024-07-28 MED ORDER — OXYCODONE HCL 20 MG PO TABS: 20.0000 mg | ORAL_TABLET | ORAL | 0 refills | Status: AC | PRN

## 2024-07-28 NOTE — Therapy (Signed)
 " OUTPATIENT OCCUPATIONAL THERAPY NEURO EVALUATION  Patient Name: Joseph [name] MRN: [id number] DOB:[birth year], 44 y.o., male Today's Date: 07/29/2024  PCP: Leonarda Roxan BROCKS, NP REFERRING PROVIDER: Debby Fidela CROME, NP  END OF SESSION:  OT End of Session - 07/29/24 1418     Visit Number 1    Number of Visits 5    Date for Recertification  10/27/24    Authorization Type [employer]-auth req'd    OT Start Time 1230    OT Stop Time 1315    OT Time Calculation (min) 45 min    Equipment Utilized During Treatment blue putty    Activity Tolerance Patient tolerated treatment well    Behavior During Therapy [facility] for tasks assessed/performed          Past Medical History:  Diagnosis Date   Anemia    Anxiety    Asthma    as a child   Depression    Narcotic abuse (HCC)    Nasal septal perforation    Pulmonary nodules 04/29/2024   Past Surgical History:  Procedure Laterality Date   CLOSED REDUCTION FINGER WITH PERCUTANEOUS PINNING Right 06/22/2013   Procedure: RIGHT HAND SMALL FINGER CLOSED MANIPULATION AND PINNING;  Surgeon: Prentice LELON Pagan, MD;  Location: [facility] OR;  Service: Orthopedics;  Laterality: Right;   HUMERUS IM NAIL Right 05/21/2024   Procedure: INSERTION, INTRAMEDULLARY ROD, HUMERUS;  Surgeon: Dozier Soulier, MD;  Location: [facility] OR;  Service: Orthopedics;  Laterality: Right;   I & D EXTREMITY  03/24/2012   Procedure: IRRIGATION AND DEBRIDEMENT EXTREMITY;  Surgeon: Oneil BROCKS Herald, MD;  Location: [facility] OR;  Service: Orthopedics;  Laterality: Right;  Right Forearm   POSTERIOR CERVICAL FUSION/FORAMINOTOMY N/A 05/05/2024   Procedure: POSTERIOR CERVICAL FUSION CERVICAL SEVEN-THORACIC ONE, THORACIC ONE-THORACIC TWO, THORACIC TWO-THORACIC THREE ,FOR RESECTION OF TUMOR THORACIC TWO CORPECTOMY;  Surgeon: Darnella Dorn SAUNDERS, MD;  Location: [facility] OR;  Service: Neurosurgery;  Laterality: N/A;   Patient Active Problem List   Diagnosis Date Noted   Moderate persistent asthma without complication  06/22/2024   History of pulmonary embolism 06/22/2024   Iron  deficiency anemia 06/22/2024   Depression with anxiety 06/10/2024   Stress and adjustment reaction 06/07/2024   Spinal cord injury at T1-T6 level (HCC) 06/02/2024   Malignant neoplasm metastatic to bone (HCC) 05/24/2024   Normocytic anemia 05/21/2024   Pathologic humeral fracture 05/21/2024   Pathological fracture of right humerus 05/20/2024   Acute incomplete paraplegia (HCC) 05/17/2024   Pathologic fracture of thoracic vertebrae 05/16/2024   Papillary thyroid  carcinoma (HCC) 05/10/2024   Malignant neoplasm metastatic to both lungs (HCC) 05/10/2024   Metastatic cancer to spine (HCC) 05/06/2024   Papillary carcinoma (HCC) 05/06/2024   Thoracic spine fracture (HCC) 05/02/2024   Ataxia 05/01/2024   Right shoulder pain 05/01/2024   Pulmonary HTN (HCC) 04/29/2024   Low back pain 04/29/2024   Pulmonary nodules 04/29/2024   Thyroid  nodule 04/29/2024   Pulmonary embolism (HCC) 04/28/2024   Thrombocytopenia 03/31/2018   Depression 03/31/2018   GAD (generalized anxiety disorder) 12/04/2014   Alcohol use disorder, severe, dependence (HCC) 12/03/2014   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) 12/03/2014   Cocaine use disorder, severe, dependence (HCC) 12/03/2014   Cannabis use disorder, severe, dependence (HCC) 12/03/2014    ONSET DATE: 07/15/2024 referral date  Admit date: 06/02/2024 Discharge date: 06/20/2024  REFERRING DIAG: G82.22 (ICD-10-CM) - Acute incomplete paraplegia (HCC)  THERAPY DIAG:  Spinal cord injury at T1-T6  level (HCC)  Open fracture of second thoracic vertebra, unspecified fracture morphology, sequela  Pathological fracture of right humerus due to neoplastic disease, initial encounter  Muscle weakness (generalized)  Other lack of coordination  Other symptoms and signs involving the musculoskeletal system  Other symptoms and  signs involving the nervous system  Other disturbances of skin sensation  Rationale for Evaluation and Treatment: Rehabilitation  SUBJECTIVE:   SUBJECTIVE STATEMENT: I've got numbness in the sides of my legs.I was hospitalized from  Pt accompanied by: self  PERTINENT HISTORY: Joseph [name] is a 44 y.o. male  with PMH of asthma, anxiety, depression, with history of polysubstance use disorder  who presented to [facility] on 04/27/2024 due to worsening low back pain, rib pain for several weeks. Due to pain, he endorsed difficulty walking and saddle anesthesia. In ED he was hemodynamically stable, A chest xray showed possible LUL nodule and degenerative changes in the lumbar spine. Right should x-ray showed humeral lucency in the proximal humerus, differential considerations included metastatic disease or multiple myeloma. CT scan showed a large Schmorl's node at the inferior L5 endplate. CT angiogram of chest showed right upper lobe segmental pulmonary embolism with incidental findings of right lower lobe nodules, enlarged and heterogeneous left thyroid  lobe measuring up to 5 cm with right tracheal deviation. Nonemergent thyroid  ultrasound recommended. Heparin  was initiated for pulmonary embolism, patient transferred to [facility] for admission.    Due to difficulty with ambulation and LE sensory changes, neurology was consulted and recommended full spine MRI which showed a pathologic fracture at T2 with several spinal canal stenosis and cord compression L > R with subtle thoracic cord signal change suggesting early cord edema. He was transferred to [facility] on 11/9 for neurosurgery consultation and operative intervention after eliquis  wash out. Patient underwent a posterior T1-3 instrumentation and fusion with T1-3 laminectomy, T2 corpectomy for resection of metastatic tumor by Dr. Darnella on 11/13. IR performed a ultrasound guided needle aspiration of thyroid  mass that revealed  papillary carcinoma, surgical path from T2 confirmed metastatic papillary thyroid  carcinoma. Oncology was consulted and reccommended outpatient follow up with ENT, radiation oncology, and endocrinology.    Palliative care consulted for goals of care and pain management. A prevena suction vac was placed by [facility]OC on 11/24, discontinued on 11/27. He continues on Eliquis . On 11/17 he developed a fever where a workup was negative, WBC trended down. He was admitted to CIR on 11/24 where he was significantly limited by right shoulder pain, initially suspected to be rotator cuff pathology. A MRI completed on 11/29 showed a large tumor with associated pathological fracture of the proximal right humerus. He was admitted back to acute care services. Dr. Dozier performed IMN of R humerus on 11/29. Post op he was WBAT to the RUE. Hospital course complicated by chronic pain with palliative care was consulted for Radiation oncology consulted for palliative radiation to the spine and humerus with plans for 2-week course beginning 12/11 to the spine and 12/18 to the humerus.  He will follow-up with oncology as outpatient for systemic treatment. Palliative care continues to follow of pain/symptom management.   PRECAUTIONS: Other: thyroid  cancer, no electrical modalities, RUE WBAT, blood thinners  WEIGHT BEARING RESTRICTIONS: WBAT RUE  PAIN:  Are you having pain? Yes: NPRS scale: 3/10 Pain location: lower back Pain description: dull Aggravating factors: bending over Relieving factors: medications  FALLS: Has patient fallen in last 6 months? 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  OBJECTIVE:  Note: Objective measures were completed at Evaluation unless otherwise noted.  HAND DOMINANCE: Right  ADLs: Overall ADLs: Independent/Mod I  Equipment: Shower seat with back, Grab bars, and Walk in shower  IADLs: Shopping: Mom completes  Light housekeeping: Great aunt completing Meal Prep: Reports cooking his own breakfast Community mobility: Currently  Medication management: Great aunt helps  Financial management: Currently not completing d/t not working. Working on setting up disability  Handwriting: not assessed  MOBILITY STATUS: Requiring FWW for functional mobility, did not require previously.  POSTURE COMMENTS:  anterior pelvic tilt Sitting balance: WNL  ACTIVITY TOLERANCE: Activity tolerance: Pt reports this is getting better.  FUNCTIONAL OUTCOME MEASURES: Quick Dash: 11.4/100  UPPER EXTREMITY ROM:    Active ROM Right eval Left eval  Shoulder flexion 119   Shoulder abduction    Shoulder adduction    Shoulder extension    Shoulder internal rotation    Shoulder external rotation    Elbow flexion    Elbow extension    Wrist flexion    Wrist extension    Wrist ulnar deviation    Wrist radial deviation    Wrist pronation    Wrist supination    (Blank rows = not tested)  UPPER EXTREMITY MMT:     MMT Right eval Left eval  Shoulder flexion    Shoulder abduction    Shoulder adduction    Shoulder extension    Shoulder internal rotation    Shoulder external rotation    Middle trapezius    Lower trapezius    Elbow flexion    Elbow extension    Wrist flexion    Wrist extension    Wrist ulnar deviation    Wrist radial deviation    Wrist pronation    Wrist supination    (Blank rows = not tested)  HAND FUNCTION: Grip strength: Right: 100.9 avg lbs; Left: 84.5 lbs  COORDINATION: 9 Hole Peg test: Right: 27 sec; Left: 28 sec Box and Blocks:  Right 51 blocks, Left 51blocks  SENSATION: Paresthesias on sides of  BLE, but reports some improvement  EDEMA: reports some in his ankles and occasional swelling in hands but that this is not constant.   COGNITION: Overall cognitive status: Within functional limits for tasks assessed  VISION: Subjective report: Pt reports no changes or decline in vision. Baseline vision: No visual deficits Visual history: none  VISION ASSESSMENT: To be further assessed in functional context  Patient has difficulty with following activities due to following visual impairments: none  PERCEPTION: Not tested  PRAXIS: Not tested  OBSERVATIONS: impaired endurance, impaired RUE FM coordination (Box and Blocks), impaired L grip strength, impaired ability to complete IADLs  TREATMENT DATE:  - Self-care/home management completed for duration as noted below including: Educated pt in purpose of OT, POC, and goals.   - Therapeutic exercises completed for duration as noted below including: Educated pt in theraputty HEP to improve L hand strength for improved bilateral use of hands.         PATIENT EDUCATION: Education details: purpose of OT, theraputty HEP Person educated: Patient Education method: Medical Illustrator Education comprehension: verbalized understanding, returned demonstration, and needs further education  HOME EXERCISE PROGRAM: 07/29/24: Theraputty HEP   GOALS: Goals reviewed with patient? Yes  SHORT TERM GOALS: Target date: 08/20/24  Patient will demonstrate updated RUE and LUE HEP with 25% verbal cues or less for proper execution (RUE ROM as tolerated and R coordination, L grip)  Baseline: Putty HEP initiated Goal status: INITIAL  2.  Pt will verbalize understanding of adapted strategies and/or equipment PRN to increase safety and independence with ADLs and IADLs (I.e. reacher, long handled sponge)  Baseline:  New to OP OT Goal status: INITIAL  3. Pt will completed prolonged dynamic standing balance activities for at least 7 minutes for carryover with improved endurance Baseline: Pt reports no decreased endurance, however endurance affected per PT report (see at eval) Goal status: INITIAL   LONG TERM GOALS: Target date: 09/27/24  Pt will be able to place at least 53 blocks using right hand with completion of Box and Blocks test.  Baseline: Right 51 blocks, Left 51blocks Goal status: INITIAL  2.  Patient will demonstrate at least 95 lbs Lgrip strength as needed to open jars and other containers.  Baseline: Right: 100.9 avg lbs; Left: 84.5 lbs Goal status: INITIAL  3.   Pt will report or demonstrate light housekeeping IADLs at Mod I Baseline: Great aunt completing Goal status: INITIAL   ASSESSMENT:  CLINICAL IMPRESSION: Patient is a 44 y.o. male who was seen today for occupational therapy evaluation for acute incomplete paraplegia. Hx includes substance use disorder, thyroid  cancer, R proximal humeral pathological fx. Patient currently presents slightly below baseline level of functioning demonstrating functional deficits and impairments as noted below. Pt would benefit from skilled OT services in the outpatient setting to work on impairments as noted below to help pt return to PLOF as able.     PERFORMANCE DEFICITS: in functional skills including IADLs, coordination, dexterity, ROM, strength, endurance, and UE functional use, cognitive skills including safety awareness, and psychosocial skills including coping strategies, environmental adaptation, and habits.   IMPAIRMENTS: are limiting patient from IADLs and work.   CO-MORBIDITIES: has co-morbidities such as substance abuse disorder, thyroid  cancer that affects occupational performance. Patient will benefit from skilled OT to address above impairments and improve overall function.  MODIFICATION OR ASSISTANCE TO COMPLETE EVALUATION: No  modification of tasks or assist necessary to complete an evaluation.  OT OCCUPATIONAL PROFILE AND HISTORY: Problem focused assessment: Including review of records relating to presenting problem.  CLINICAL DECISION MAKING: LOW - limited treatment options, no task modification necessary  REHAB POTENTIAL: Fair evidenced by high PLOF however hx of substance use disorder serves as a barrier.  EVALUATION COMPLEXITY: Low    PLAN:  OT FREQUENCY: 1x/week plus eval  OT DURATION: 4 weeks  PLANNED INTERVENTIONS: 97168 OT Re-evaluation, 97535 self care/ADL training, 02889 therapeutic exercise, 97530 therapeutic activity, 97112 neuromuscular re-education, 97140 manual therapy, 97116 gait training, 02981 paraffin, 02960 fluidotherapy, 97010 moist heat, passive range of motion, energy conservation, coping strategies training, patient/family education, and DME and/or AE instructions  RECOMMENDED OTHER SERVICES: receiving PT eval  CONSULTED AND AGREED WITH PLAN OF CARE: Patient  PLAN FOR NEXT SESSION: Coordination HEP Supine ROM for R shoulder as tolerated  Dynamic standing balance activities.   Rocky Dutch, OT 07/29/2024, 2:19 PM           "

## 2024-07-29 ENCOUNTER — Other Ambulatory Visit: Payer: Self-pay

## 2024-07-29 ENCOUNTER — Ambulatory Visit: Payer: MEDICAID

## 2024-07-29 ENCOUNTER — Ambulatory Visit: Payer: Self-pay | Admitting: General Surgery

## 2024-07-29 VITALS — BP 144/88 | HR 92

## 2024-07-29 DIAGNOSIS — R208 Other disturbances of skin sensation: Secondary | ICD-10-CM

## 2024-07-29 DIAGNOSIS — M6281 Muscle weakness (generalized): Secondary | ICD-10-CM

## 2024-07-29 DIAGNOSIS — S24101A Unspecified injury at T1 level of thoracic spinal cord, initial encounter: Secondary | ICD-10-CM

## 2024-07-29 DIAGNOSIS — R29818 Other symptoms and signs involving the nervous system: Secondary | ICD-10-CM

## 2024-07-29 DIAGNOSIS — M84521A Pathological fracture in neoplastic disease, right humerus, initial encounter for fracture: Secondary | ICD-10-CM

## 2024-07-29 DIAGNOSIS — S22029S Unspecified fracture of second thoracic vertebra, sequela: Secondary | ICD-10-CM

## 2024-07-29 DIAGNOSIS — R278 Other lack of coordination: Secondary | ICD-10-CM

## 2024-07-29 DIAGNOSIS — R29898 Other symptoms and signs involving the musculoskeletal system: Secondary | ICD-10-CM

## 2024-07-29 DIAGNOSIS — R2689 Other abnormalities of gait and mobility: Secondary | ICD-10-CM

## 2024-07-29 NOTE — Progress Notes (Signed)
 Sent message, via epic in basket, requesting orders in epic from Careers adviser.
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
## 2024-08-01 ENCOUNTER — Inpatient Hospital Stay: Payer: MEDICAID | Admitting: Licensed Clinical Social Worker

## 2024-08-05 ENCOUNTER — Encounter (HOSPITAL_COMMUNITY): Admission: RE | Admit: 2024-08-05 | Payer: MEDICAID

## 2024-08-09 ENCOUNTER — Inpatient Hospital Stay: Payer: MEDICAID

## 2024-08-09 ENCOUNTER — Ambulatory Visit (HOSPITAL_COMMUNITY): Admission: RE | Admit: 2024-08-09 | Payer: MEDICAID | Admitting: General Surgery

## 2024-08-09 ENCOUNTER — Encounter (HOSPITAL_COMMUNITY): Admission: RE | Payer: Self-pay | Source: Home / Self Care

## 2024-08-16 ENCOUNTER — Ambulatory Visit: Payer: MEDICAID

## 2024-08-16 ENCOUNTER — Ambulatory Visit: Payer: MEDICAID | Admitting: Physical Therapy

## 2024-08-18 ENCOUNTER — Ambulatory Visit: Payer: MEDICAID | Admitting: Physical Therapy

## 2024-08-22 ENCOUNTER — Ambulatory Visit: Payer: MEDICAID

## 2024-08-22 ENCOUNTER — Ambulatory Visit: Payer: MEDICAID | Admitting: Physical Therapy

## 2024-08-24 ENCOUNTER — Ambulatory Visit: Payer: MEDICAID | Admitting: Physical Therapy

## 2024-08-25 ENCOUNTER — Inpatient Hospital Stay: Payer: MEDICAID

## 2024-08-29 ENCOUNTER — Ambulatory Visit: Payer: MEDICAID | Admitting: Physical Therapy

## 2024-08-29 ENCOUNTER — Ambulatory Visit: Payer: MEDICAID

## 2024-08-31 ENCOUNTER — Ambulatory Visit: Payer: MEDICAID | Admitting: Physical Therapy

## 2024-09-05 ENCOUNTER — Ambulatory Visit: Payer: MEDICAID | Admitting: Occupational Therapy

## 2024-09-05 ENCOUNTER — Ambulatory Visit: Payer: MEDICAID | Admitting: Physical Therapy

## 2024-09-06 ENCOUNTER — Ambulatory Visit (INDEPENDENT_AMBULATORY_CARE_PROVIDER_SITE_OTHER): Payer: MEDICAID

## 2024-09-07 ENCOUNTER — Ambulatory Visit: Payer: MEDICAID | Admitting: Physical Therapy

## 2024-09-09 ENCOUNTER — Encounter: Payer: MEDICAID | Admitting: Physical Medicine and Rehabilitation

## 2024-09-12 ENCOUNTER — Ambulatory Visit: Payer: MEDICAID | Admitting: Physical Therapy

## 2024-09-14 ENCOUNTER — Ambulatory Visit: Payer: MEDICAID | Admitting: Physical Therapy

## 2024-09-19 ENCOUNTER — Ambulatory Visit: Payer: MEDICAID | Admitting: Physical Therapy

## 2024-09-21 ENCOUNTER — Ambulatory Visit: Payer: MEDICAID | Admitting: Physical Therapy

## 2024-12-20 ENCOUNTER — Ambulatory Visit: Payer: MEDICAID | Admitting: Family
